# Patient Record
Sex: Female | Born: 1935 | Race: Black or African American | Hispanic: No | Marital: Married | Smoking: Former smoker
Health system: Southern US, Community
[De-identification: ages and names within clinical notes are randomized; demographics above are authoritative.]

## PROBLEM LIST (undated history)

## (undated) DIAGNOSIS — E7800 Pure hypercholesterolemia, unspecified: Secondary | ICD-10-CM

## (undated) DIAGNOSIS — M48062 Spinal stenosis, lumbar region with neurogenic claudication: Secondary | ICD-10-CM

## (undated) DIAGNOSIS — E1122 Type 2 diabetes mellitus with diabetic chronic kidney disease: Secondary | ICD-10-CM

## (undated) DIAGNOSIS — E119 Type 2 diabetes mellitus without complications: Secondary | ICD-10-CM

## (undated) DIAGNOSIS — H409 Unspecified glaucoma: Secondary | ICD-10-CM

## (undated) DIAGNOSIS — I10 Essential (primary) hypertension: Secondary | ICD-10-CM

## (undated) DIAGNOSIS — N186 End stage renal disease: Secondary | ICD-10-CM

## (undated) DIAGNOSIS — F419 Anxiety disorder, unspecified: Secondary | ICD-10-CM

## (undated) DIAGNOSIS — M1990 Unspecified osteoarthritis, unspecified site: Secondary | ICD-10-CM

## (undated) DIAGNOSIS — D75839 Thrombocytosis, unspecified: Secondary | ICD-10-CM

## (undated) DIAGNOSIS — E785 Hyperlipidemia, unspecified: Secondary | ICD-10-CM

## (undated) DIAGNOSIS — I700 Atherosclerosis of aorta: Secondary | ICD-10-CM

## (undated) DIAGNOSIS — G2581 Restless legs syndrome: Secondary | ICD-10-CM

## (undated) DIAGNOSIS — E039 Hypothyroidism, unspecified: Secondary | ICD-10-CM

## (undated) DIAGNOSIS — I25110 Atherosclerotic heart disease of native coronary artery with unstable angina pectoris: Secondary | ICD-10-CM

## (undated) DIAGNOSIS — Z992 Dependence on renal dialysis: Secondary | ICD-10-CM

## (undated) DIAGNOSIS — I2510 Atherosclerotic heart disease of native coronary artery without angina pectoris: Secondary | ICD-10-CM

## (undated) HISTORY — PX: FOOT SURGERY: SHX648

## (undated) HISTORY — DX: Unspecified osteoarthritis, unspecified site: M19.90

## (undated) HISTORY — PX: KNEE ARTHROSCOPY W/ MENISCECTOMY: SHX1879

## (undated) HISTORY — PX: KNEE ARTHROSCOPY: SHX127

## (undated) HISTORY — PX: CORONARY ANGIOPLASTY WITH STENT PLACEMENT: SHX49

## (undated) HISTORY — DX: Hyperlipidemia, unspecified: E78.5

## (undated) HISTORY — PX: BLEPHAROPLASTY: SUR158

## (undated) HISTORY — DX: Essential (primary) hypertension: I10

## (undated) HISTORY — DX: Unspecified glaucoma: H40.9

## (undated) HISTORY — PX: ABDOMINAL HYSTERECTOMY: SHX81

## (undated) HISTORY — PX: CATARACT EXTRACTION W/ INTRAOCULAR LENS  IMPLANT, BILATERAL: SHX1307

## (undated) HISTORY — DX: Type 2 diabetes mellitus without complications: E11.9

## (undated) HISTORY — DX: Atherosclerotic heart disease of native coronary artery without angina pectoris: I25.10

---

## 1997-09-25 DIAGNOSIS — I2510 Atherosclerotic heart disease of native coronary artery without angina pectoris: Secondary | ICD-10-CM

## 1997-09-25 HISTORY — PX: CORONARY ANGIOPLASTY WITH STENT PLACEMENT: SHX49

## 1997-09-25 HISTORY — DX: Atherosclerotic heart disease of native coronary artery without angina pectoris: I25.10

## 2008-09-25 DIAGNOSIS — K631 Perforation of intestine (nontraumatic): Secondary | ICD-10-CM

## 2008-09-25 HISTORY — PX: COLONOSCOPY: SHX174

## 2008-09-25 HISTORY — DX: Perforation of intestine (nontraumatic): K63.1

## 2009-03-25 HISTORY — PX: COLOSTOMY: SHX63

## 2009-08-25 HISTORY — PX: COLOSTOMY REVERSAL: SHX5782

## 2010-09-06 ENCOUNTER — Ambulatory Visit: Payer: Self-pay | Admitting: Family Medicine

## 2012-01-09 ENCOUNTER — Ambulatory Visit: Payer: Self-pay

## 2012-01-24 HISTORY — PX: KNEE ARTHROSCOPY W/ MENISCECTOMY: SHX1879

## 2012-02-06 ENCOUNTER — Ambulatory Visit: Payer: Self-pay | Admitting: Orthopedic Surgery

## 2012-02-06 LAB — CBC WITH DIFFERENTIAL/PLATELET
Basophil %: 0.5 %
Eosinophil %: 3.5 %
HCT: 38.7 % (ref 35.0–47.0)
Lymphocyte %: 23.8 %
MCHC: 32.3 g/dL (ref 32.0–36.0)
MCV: 90 fL (ref 80–100)
Monocyte #: 0.6 x10 3/mm (ref 0.2–0.9)
Neutrophil %: 63 %
RBC: 4.31 10*6/uL (ref 3.80–5.20)
RDW: 16.5 % — ABNORMAL HIGH (ref 11.5–14.5)

## 2012-02-06 LAB — BASIC METABOLIC PANEL
Anion Gap: 8 (ref 7–16)
Calcium, Total: 8.9 mg/dL (ref 8.5–10.1)
Chloride: 107 mmol/L (ref 98–107)
Creatinine: 1.07 mg/dL (ref 0.60–1.30)
Glucose: 81 mg/dL (ref 65–99)
Osmolality: 284 (ref 275–301)
Sodium: 142 mmol/L (ref 136–145)

## 2012-02-13 ENCOUNTER — Ambulatory Visit: Payer: Self-pay | Admitting: Orthopedic Surgery

## 2012-02-13 DIAGNOSIS — I2510 Atherosclerotic heart disease of native coronary artery without angina pectoris: Secondary | ICD-10-CM

## 2012-02-23 ENCOUNTER — Ambulatory Visit: Payer: Self-pay | Admitting: Orthopedic Surgery

## 2012-12-06 ENCOUNTER — Ambulatory Visit: Payer: Self-pay | Admitting: Family Medicine

## 2012-12-07 ENCOUNTER — Emergency Department: Payer: Self-pay | Admitting: Emergency Medicine

## 2012-12-07 LAB — CBC WITH DIFFERENTIAL/PLATELET
Basophil %: 0.7 %
Eosinophil #: 0.2 10*3/uL (ref 0.0–0.7)
Eosinophil %: 1.9 %
HCT: 37.2 % (ref 35.0–47.0)
HGB: 11.9 g/dL — ABNORMAL LOW (ref 12.0–16.0)
Lymphocyte %: 12.7 %
MCH: 28.1 pg (ref 26.0–34.0)
MCHC: 32 g/dL (ref 32.0–36.0)
MCV: 88 fL (ref 80–100)
Monocyte #: 1.1 x10 3/mm — ABNORMAL HIGH (ref 0.2–0.9)
Neutrophil #: 7.1 10*3/uL — ABNORMAL HIGH (ref 1.4–6.5)
Platelet: 216 10*3/uL (ref 150–440)
RBC: 4.23 10*6/uL (ref 3.80–5.20)
WBC: 9.6 10*3/uL (ref 3.6–11.0)

## 2012-12-07 LAB — COMPREHENSIVE METABOLIC PANEL
Albumin: 3.2 g/dL — ABNORMAL LOW (ref 3.4–5.0)
Alkaline Phosphatase: 72 U/L (ref 50–136)
Anion Gap: 4 — ABNORMAL LOW (ref 7–16)
BUN: 18 mg/dL (ref 7–18)
Bilirubin,Total: 0.4 mg/dL (ref 0.2–1.0)
Calcium, Total: 8.9 mg/dL (ref 8.5–10.1)
Chloride: 106 mmol/L (ref 98–107)
Co2: 27 mmol/L (ref 21–32)
EGFR (African American): 52 — ABNORMAL LOW
EGFR (Non-African Amer.): 45 — ABNORMAL LOW
Potassium: 4.2 mmol/L (ref 3.5–5.1)
SGPT (ALT): 33 U/L (ref 12–78)
Sodium: 137 mmol/L (ref 136–145)
Total Protein: 8.6 g/dL — ABNORMAL HIGH (ref 6.4–8.2)

## 2013-08-04 ENCOUNTER — Ambulatory Visit: Payer: Self-pay | Admitting: Family Medicine

## 2013-09-19 ENCOUNTER — Ambulatory Visit: Payer: Self-pay | Admitting: Physical Medicine and Rehabilitation

## 2013-09-23 ENCOUNTER — Emergency Department: Payer: Self-pay | Admitting: Internal Medicine

## 2014-01-07 DIAGNOSIS — I2510 Atherosclerotic heart disease of native coronary artery without angina pectoris: Secondary | ICD-10-CM | POA: Diagnosis present

## 2014-03-09 DIAGNOSIS — G629 Polyneuropathy, unspecified: Secondary | ICD-10-CM | POA: Insufficient documentation

## 2014-05-04 DIAGNOSIS — M48062 Spinal stenosis, lumbar region with neurogenic claudication: Secondary | ICD-10-CM | POA: Insufficient documentation

## 2014-11-13 LAB — TSH: TSH: 1.2 u[IU]/mL (ref <=5.90)

## 2014-11-29 ENCOUNTER — Emergency Department: Payer: Self-pay | Admitting: Emergency Medicine

## 2014-11-29 DIAGNOSIS — A045 Campylobacter enteritis: Secondary | ICD-10-CM

## 2014-11-29 HISTORY — DX: Campylobacter enteritis: A04.5

## 2015-01-04 ENCOUNTER — Telehealth: Payer: Self-pay | Admitting: *Deleted

## 2015-01-04 NOTE — Telephone Encounter (Signed)
pt has a cousin that comes here [facility]). Pt is looking for a new PCP. Is it ok to schedule her with you as a new pt.

## 2015-01-04 NOTE — Telephone Encounter (Signed)
yes

## 2015-01-05 LAB — LIPID PANEL
Cholesterol: 157 mg/dL (ref 0–200)
HDL: 40 mg/dL (ref 35–70)
LDL Cholesterol: 94 mg/dL
Triglycerides: 119 mg/dL (ref 40–160)

## 2015-01-05 LAB — HEMOGLOBIN A1C: HEMOGLOBIN A1C: 6.5 % — AB (ref 4.0–6.0)

## 2015-01-17 NOTE — Op Note (Signed)
PATIENT NAME:  Cassandra [name], Cassandra [name] MR#:  [id number] DATE OF BIRTH:  [birth year]  DATE OF PROCEDURE:  02/13/2012  PREOPERATIVE DIAGNOSES:  1. Left knee osteoarthritis. 2. Medial meniscus tear.   POSTOPERATIVE DIAGNOSES:  1. Left knee osteoarthritis. 2. Medial meniscus tear. 3. Lateral meniscus tear.    PROCEDURES: Left knee arthroscopy, partial medial and lateral meniscectomy.   SURGEON: Laurene Footman, MD  ANESTHESIA: General.    DESCRIPTION OF PROCEDURE: Patient brought to the Operating Room and after adequate anesthesia was obtained the left knee was prepped and draped in the usual sterile fashion with an arthroscopic legholder and tourniquet applied but not required. After prepping and draping and timeout procedures were completed, an inferolateral portal was made and the arthroscope was introduced. Initial inspection revealed some mild patellofemoral degenerative change. No exposed bone and no fissuring to bone with normal tracking. Coming around medially, an inferomedial portal was made and on probing there was extensive complex tear of the middle and posterior thirds of the meniscus involving most of the body. There were horizontal and vertical components. The anterior cruciate ligament was intact. The articular cartilage showed some grade I and II changes but again no fissuring, no exposed bone and no severe degenerative changes. Going laterally there was a radial tear at the posterior horn of the lateral meniscus involving approximately half the depth of the meniscus and extending towards the medial aspect. The articular cartilage was near normal. A meniscal punch was used initially followed by an ArthroCare wand to debride this meniscal tear. Going medially a meniscal punch was used followed by a shaver to get the large meniscus tear removed with resection of most of the middle and posterior thirds. A wand was then used to smooth the edges and minimize bleeding. The gutters were checked and  there were no loose bodies. After thorough irrigation of the knee, all instrumentation was withdrawn. The wounds were closed with 4-0 nylon in simple interrupted fashion. 20 mL of 0.5% Sensorcaine with epinephrine infiltrated into the incisions. Xeroform, 4 x 4's, Webril, Ace wrap were applied. Patient sent to recovery room in stable condition.    ESTIMATED BLOOD LOSS: Minimal.   COMPLICATIONS: None.   SPECIMEN: None.    ____________________________ Laurene Footman, MD mjm:cms D: 02/13/2012 21:58:30 ET T: 02/14/2012 09:53:04 ET JOB#: [id number]  cc: Laurene Footman, MD, <Dictator> Laurene Footman MD ELECTRONICALLY SIGNED 02/14/2012 17:49

## 2015-01-21 ENCOUNTER — Ambulatory Visit (INDEPENDENT_AMBULATORY_CARE_PROVIDER_SITE_OTHER): Payer: Medicare Other | Admitting: Internal Medicine

## 2015-01-21 ENCOUNTER — Encounter (INDEPENDENT_AMBULATORY_CARE_PROVIDER_SITE_OTHER): Payer: Self-pay

## 2015-01-21 ENCOUNTER — Encounter: Payer: Self-pay | Admitting: Internal Medicine

## 2015-01-21 VITALS — BP 126/64 | HR 55 | Temp 97.7°F | Resp 14 | Ht 61.0 in | Wt 173.8 lb

## 2015-01-21 DIAGNOSIS — G609 Hereditary and idiopathic neuropathy, unspecified: Secondary | ICD-10-CM | POA: Diagnosis not present

## 2015-01-21 DIAGNOSIS — R609 Edema, unspecified: Secondary | ICD-10-CM | POA: Diagnosis not present

## 2015-01-21 DIAGNOSIS — I10 Essential (primary) hypertension: Secondary | ICD-10-CM

## 2015-01-21 DIAGNOSIS — K5909 Other constipation: Secondary | ICD-10-CM

## 2015-01-21 DIAGNOSIS — G2581 Restless legs syndrome: Secondary | ICD-10-CM | POA: Diagnosis not present

## 2015-01-21 DIAGNOSIS — K5900 Constipation, unspecified: Secondary | ICD-10-CM

## 2015-01-21 DIAGNOSIS — E1142 Type 2 diabetes mellitus with diabetic polyneuropathy: Secondary | ICD-10-CM

## 2015-01-21 LAB — FERRITIN: FERRITIN: 217.2 ng/mL (ref 10.0–291.0)

## 2015-01-21 LAB — VITAMIN B12: VITAMIN B 12: 321 pg/mL (ref 211–911)

## 2015-01-21 MED ORDER — BENAZEPRIL HCL 40 MG PO TABS: 40.0000 mg | ORAL_TABLET | Freq: Every day | ORAL | Status: DC

## 2015-01-21 NOTE — Progress Notes (Signed)
Pre-visit discussion using our clinic review tool. No additional management support is needed unless otherwise documented below in the visit note.  

## 2015-01-21 NOTE — Patient Instructions (Signed)
Your fluid retention  May be caused by  several of your medications combining  to cause the water retention   I am stopping the AMLODIPINE necause it can do this,  I have increaesd the benazeprine to 40 mg in its place.  The gabapentin can also cause fluid retention,  But we have very few alternatives,    I am checking iron stores today

## 2015-01-21 NOTE — Progress Notes (Signed)
Patient ID: Cassandra [name], female   DOB: 1927-08-05, 81 y.o.   MRN: [id number]   Patient Active Problem List   Diagnosis Date Noted  . Edema 01/24/2015  . Essential hypertension 01/24/2015  . 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 Restless legs syndrome 01/24/2015  . Chronic constipation 01/24/2015    Subjective:  CC:   Chief Complaint  Patient presents with  . Establish Care    HPI:   Cassandra [name] a 81 y.o. female who presents as a new patient in transfer from [place] clinic with several chief complaints.   Cc:  Swollen ankles for the past 5 months  , on and off,  previous doctor did not adequately address.  On 3 meds, that can cause edema and she is using aleve or meloxicam for leg spasms, but also seeing Neurology Dr. Brigitte Pulse,  And taking ropinirole  0.5 in the evening.  Has muscle jerks  At 2 pm and Dr Brigitte Pulse recommended increasing the dose to 2 daily,  But she has not done this .  Feels tingling in the legs first,  Then the jerking starts,    HISTORY OF FOOT SURGERY IN 2000, LEFT KNEE ARTHRSCOPY 2014.  2) Chronic constipation  History of perforated colon.  Takes stool softener every night per MD , 3) elevated blood sugars 5 months ago,  daignosed with DM and prescribed glimepiride 1 mg wth breakfast but wants to take it at dinner.    4) Tinnitus,  Wears a heaing  Aid, since last year,  Doesn't think she needs it.  Uses it only when tinnitus acts up. Tinnitus is worse in the evening when the house is quiet.    5) Hypertension  6) hyperlipidimia  7) Neuropathy. Unclear if workup was done, or for restless legs.   8) History of colonic peforation s/p colectomy with reversal   9)HAS NOT HAD A MAMMOGRAM IN 2 YEARS.  DISCUSSED RESUMING THEM  GETS ANNUAL STRESS TEST      Past Medical History  Diagnosis Date  . Arthritis   . Diabetes mellitus without complication   . Glaucoma   . Hyperlipidemia   . Hypertension         @ALL @  Past Surgical History  Procedure Laterality Date  . Coronary angioplasty with stent placement    . Abdominal hysterectomy      History   Social History  . Marital Status: Married    Spouse Name: N/A  . Number of Children: N/A  . Years of Education: N/A   Occupational History  . Not on file.   Social History Main Topics  . Smoking status: Former Smoker    Quit date: 01/21/1980  . Smokeless tobacco: Never Used  . Alcohol Use: 0.0 oz/week    0 Standard drinks or equivalent per week     Comment: occasionally  . Drug Use: No  . Sexual Activity: No   Other Topics Concern  . Not on file   Social History Narrative    @FAMH @       Review of Systems:   The rest of the review of systems was negative except those addressed in the HPI.      Objective:  BP 126/64 mmHg  Pulse 55  Temp(Src) 97.7 F (36.5 C) (Oral)  Resp 14  Ht 5\' 1"  (1.549 m)  Wt 173 lb 12 oz (78.812 kg)  BMI 32.85 kg/m2  SpO2 94%  General appearance: alert, cooperative and  appears stated age Ears: normal TM's and external ear canals both ears Throat: lips, mucosa, and tongue normal; teeth and gums normal Neck: no adenopathy, no carotid bruit, supple, symmetrical, trachea midline and thyroid not enlarged, symmetric, no tenderness/mass/nodules Back: symmetric, no curvature. ROM normal. No CVA tenderness. Lungs: clear to auscultation bilaterally Heart: regular rate and rhythm, S1, S2 normal, no murmur, click, rub or gallop Abdomen: soft, non-tender; bowel sounds normal; no masses,  no organomegaly Pulses: 2+ and symmetric Skin: Skin color, texture, turgor normal. No rashes or lesions Lymph nodes: Cervical, supraclavicular, and axillary nodes normal.  Assessment and Plan:  Essential hypertension Well controlled,  But stopping the amlodipine  To improve her edema.  Increasing benazepril to 40 mg daily .  Cr was checked earlier in the month by [facility] clinic  Lab Results   Component Value Date   CREATININE 1.13 12/07/2012   Lab Results  Component Value Date   NA 137 12/07/2012   K 4.2 12/07/2012   CL 106 12/07/2012   CO2 27 12/07/2012      Chronic constipation Not clear if patient has diverticulosis,  Agree with daily use of stool softener and bullk forming laxatives    Type 2 DM with diabetic neuropathy affecting both sides of body Managed with 1 mg  Daily glimepiride.  Workup for reversible causes of neuropathy advised.    Lab Results  Component Value Date   CREATININE 1.13 12/07/2012   No results found for: Derl Barrow Lab Results  Component Value Date   [id number] 01/21/2015   Lab Results  Component Value Date   HGBA1C 6.5* 01/05/2015      Edema Will adjust medications as several may be causative followed by rule out of venous insufficiency    Restless legs syndrome Iron stores were checked today and  normal.  Continue ropinirole  Lab Results  Component Value Date   IRON 61 01/21/2015   TIBC 348 01/21/2015   FERRITIN 217.2 01/21/2015       Updated Medication List Outpatient Encounter Prescriptions as of 01/21/2015  . Order #: [id number] Class: Historical Med  . Order #: [id number] Class: Historical Med  . Order #: [id number] Class: Historical Med  . Order #: [id number] Class: Historical Med  . Order #: [id number] Class: Historical Med  . Order #: [id number] Class: Historical Med  . Order #: [id number] Class: Historical Med  . Order #: [id number] Class: Historical Med  . Order #: [id number] Class: Historical Med  . Order #: [id number] Class: Historical Med  . Order #: [id number] Class: Historical Med  . Order #: [id number] Class: Historical Med  . [DISCONTINUED] Order #: [id number] Class: Historical Med  . Order #: [id number] Class: Normal  . [DISCONTINUED] Order #: [id number] Class: Historical Med     Orders Placed This Encounter  Procedures  . Iron and TIBC  . Vitamin B12  . Folate RBC  . Ferritin  .  Hemoglobin A1c  . Lipid panel  . TSH    Return in about 4 weeks (around 02/18/2015) for follow up diabetes.

## 2015-01-22 LAB — IRON AND TIBC
%SAT: 18 % — ABNORMAL LOW (ref 20–55)
Iron: 61 ug/dL (ref 42–145)
TIBC: 348 ug/dL (ref 250–470)
UIBC: 287 ug/dL (ref 125–400)

## 2015-01-22 LAB — FOLATE RBC: RBC Folate: 530 ng/mL (ref >280)

## 2015-01-24 ENCOUNTER — Encounter: Payer: Self-pay | Admitting: Internal Medicine

## 2015-01-24 DIAGNOSIS — K5909 Other constipation: Secondary | ICD-10-CM | POA: Insufficient documentation

## 2015-01-24 DIAGNOSIS — I10 Essential (primary) hypertension: Secondary | ICD-10-CM | POA: Insufficient documentation

## 2015-01-24 DIAGNOSIS — E1142 Type 2 diabetes mellitus with diabetic polyneuropathy: Secondary | ICD-10-CM | POA: Insufficient documentation

## 2015-01-24 DIAGNOSIS — E119 Type 2 diabetes mellitus without complications: Secondary | ICD-10-CM | POA: Insufficient documentation

## 2015-01-24 DIAGNOSIS — G2581 Restless legs syndrome: Secondary | ICD-10-CM | POA: Insufficient documentation

## 2015-01-24 NOTE — Assessment & Plan Note (Addendum)
Well controlled,  But stopping the amlodipine  To improve her edema.  Increasing benazepril to 40 mg daily .  Cr was checked earlier in the month by [facility]  Lab Results  Component Value Date   CREATININE 1.13 12/07/2012   Lab Results  Component Value Date   NA 137 12/07/2012   K 4.2 12/07/2012   CL 106 12/07/2012   CO2 27 12/07/2012

## 2015-01-24 NOTE — Assessment & Plan Note (Signed)
Not clear if patient has diverticulosis,  Agree with daily use of stool softener and bullk forming laxatives

## 2015-01-24 NOTE — Telephone Encounter (Signed)
Iron, B12 and folate studies are normal.  EPIC will not route results notes currently

## 2015-01-24 NOTE — Assessment & Plan Note (Signed)
Will adjust medications as several may be causative followed by rule out of venous insufficiency

## 2015-01-24 NOTE — Assessment & Plan Note (Addendum)
Managed with 1 mg  Daily glimepiride.  Workup for reversible causes of neuropathy advised.    Lab Results  Component Value Date   CREATININE 1.13 12/07/2012   No results found for: Derl Barrow Lab Results  Component Value Date   [id number] 01/21/2015   Lab Results  Component Value Date   HGBA1C 6.5* 01/05/2015

## 2015-01-24 NOTE — Assessment & Plan Note (Signed)
Iron stores were checked today and  normal.  Continue ropinirole  Lab Results  Component Value Date   IRON 61 01/21/2015   TIBC 348 01/21/2015   FERRITIN 217.2 01/21/2015

## 2015-01-25 NOTE — Telephone Encounter (Signed)
Patient notified and voiced understanding.

## 2015-02-17 ENCOUNTER — Other Ambulatory Visit: Payer: Self-pay | Admitting: Family Medicine

## 2015-02-17 DIAGNOSIS — R9389 Abnormal findings on diagnostic imaging of other specified body structures: Secondary | ICD-10-CM

## 2015-02-18 ENCOUNTER — Ambulatory Visit: Payer: Medicare Other | Admitting: Internal Medicine

## 2015-02-25 ENCOUNTER — Ambulatory Visit
Admission: RE | Admit: 2015-02-25 | Discharge: 2015-02-25 | Disposition: A | Discharge status: Home / Self Care | Payer: Medicare Other | Source: Ambulatory Visit | Attending: Family Medicine | Admitting: Family Medicine

## 2015-02-25 DIAGNOSIS — I2510 Atherosclerotic heart disease of native coronary artery without angina pectoris: Secondary | ICD-10-CM | POA: Insufficient documentation

## 2015-02-25 DIAGNOSIS — R938 Abnormal findings on diagnostic imaging of other specified body structures: Secondary | ICD-10-CM | POA: Diagnosis present

## 2015-02-25 DIAGNOSIS — R918 Other nonspecific abnormal finding of lung field: Secondary | ICD-10-CM | POA: Insufficient documentation

## 2015-02-25 DIAGNOSIS — R9389 Abnormal findings on diagnostic imaging of other specified body structures: Secondary | ICD-10-CM

## 2015-02-25 DIAGNOSIS — R911 Solitary pulmonary nodule: Secondary | ICD-10-CM | POA: Insufficient documentation

## 2015-09-02 ENCOUNTER — Other Ambulatory Visit: Payer: Self-pay | Admitting: Specialist

## 2015-09-02 DIAGNOSIS — R918 Other nonspecific abnormal finding of lung field: Secondary | ICD-10-CM

## 2015-09-14 ENCOUNTER — Ambulatory Visit
Admission: RE | Admit: 2015-09-14 | Discharge: 2015-09-14 | Disposition: A | Discharge status: Home / Self Care | Payer: Medicare Other | Source: Ambulatory Visit | Attending: Specialist | Admitting: Specialist

## 2015-09-14 DIAGNOSIS — R918 Other nonspecific abnormal finding of lung field: Secondary | ICD-10-CM

## 2015-09-14 DIAGNOSIS — I2510 Atherosclerotic heart disease of native coronary artery without angina pectoris: Secondary | ICD-10-CM | POA: Diagnosis not present

## 2015-09-14 DIAGNOSIS — J439 Emphysema, unspecified: Secondary | ICD-10-CM | POA: Diagnosis not present

## 2015-09-14 DIAGNOSIS — I700 Atherosclerosis of aorta: Secondary | ICD-10-CM | POA: Insufficient documentation

## 2015-09-17 ENCOUNTER — Other Ambulatory Visit: Payer: Self-pay | Admitting: Specialist

## 2015-09-17 DIAGNOSIS — R911 Solitary pulmonary nodule: Secondary | ICD-10-CM

## 2015-09-24 ENCOUNTER — Encounter
Admission: RE | Admit: 2015-09-24 | Discharge: 2015-09-24 | Disposition: A | Discharge status: Home / Self Care | Payer: Medicare Other | Source: Ambulatory Visit | Attending: Specialist | Admitting: Specialist

## 2015-09-24 DIAGNOSIS — R911 Solitary pulmonary nodule: Secondary | ICD-10-CM

## 2015-09-24 LAB — GLUCOSE, CAPILLARY: GLUCOSE-CAPILLARY: 90 mg/dL (ref 65–99)

## 2015-09-24 MED ORDER — FLUDEOXYGLUCOSE F - 18 (FDG) INJECTION
12.0700 | Freq: Once | INTRAVENOUS | Status: AC | PRN
  Administered 2015-09-24: 12.07 via INTRAVENOUS

## 2015-10-04 ENCOUNTER — Inpatient Hospital Stay: Payer: Medicare Other

## 2015-10-08 ENCOUNTER — Encounter: Payer: Self-pay | Admitting: Internal Medicine

## 2015-10-08 ENCOUNTER — Inpatient Hospital Stay: Payer: Medicare Other | Attending: Internal Medicine | Admitting: Internal Medicine

## 2015-10-08 DIAGNOSIS — I2510 Atherosclerotic heart disease of native coronary artery without angina pectoris: Secondary | ICD-10-CM | POA: Insufficient documentation

## 2015-10-08 DIAGNOSIS — Z87891 Personal history of nicotine dependence: Secondary | ICD-10-CM | POA: Diagnosis not present

## 2015-10-08 DIAGNOSIS — I10 Essential (primary) hypertension: Secondary | ICD-10-CM | POA: Insufficient documentation

## 2015-10-08 DIAGNOSIS — E785 Hyperlipidemia, unspecified: Secondary | ICD-10-CM | POA: Insufficient documentation

## 2015-10-08 DIAGNOSIS — Z79899 Other long term (current) drug therapy: Secondary | ICD-10-CM | POA: Diagnosis not present

## 2015-10-08 DIAGNOSIS — R911 Solitary pulmonary nodule: Secondary | ICD-10-CM | POA: Diagnosis present

## 2015-10-08 DIAGNOSIS — Z7982 Long term (current) use of aspirin: Secondary | ICD-10-CM | POA: Diagnosis not present

## 2015-10-08 DIAGNOSIS — E119 Type 2 diabetes mellitus without complications: Secondary | ICD-10-CM | POA: Diagnosis not present

## 2015-10-08 NOTE — Progress Notes (Signed)
Pt states that she use to live in [place] and she had scans every 6 months then every year and the pulmonary nodule never changed and it was tiny so the MD in [place] stopped scanning her.  She moved to [facility] and has lots of sinusitis and then it ends up in her chest and she gets bronchitis and her PCP did cxray and repeated it because it showed nodule and then she had ct scan and then her PCP sent her to pulmonary and he did PFT test and said it was great and then sch. Her pet scan.  The nodule looked like it had increased in size and was sent here for eval.  Does not have breathing issues at this time. No sinus drainage and she does take nasal spray every day as well as allegy medicine to help with seasonal allergies

## 2015-10-08 NOTE — Progress Notes (Signed)
[place] @ [facility] [phone]  [phone]     Cassandra [name] OB: 11/12/1926  MR#: [id number]  [id number]  Patient Care Team: Dion Body, MD as PCP - General (Family Medicine)  CHIEF COMPLAINT:  Chief Complaint  Patient presents with  . abnormal pet scan     No history exists.    No flowsheet data found.  HISTORY OF PRESENT ILLNESS:   Cassandra [name] is a very active 82 year old lady, who has a history of recurrent bronchitis, and a history of a left upper lobe nodule, detected on one chest x-ray at least 5 years ago. She had several follow-up imaging studies, while in [place], and reportedly, there was no progression of the nodule. After moving down to [place] 2 years ago, she was seen again for about of bronchitis, and chest x-ray again showed the presence of this nodule, and follow-up CT scans showed slow increase in size, which led to PET/CT scan, revealing mildly increased SUV of 1.7. The patient is referred to our clinic for additional evaluation. Currently, Cassandra [name] feels well. She has no cough, chest pain, shortness of breath, bone pain, hemoptysis, weight loss. She is active around the house and plans to join the [facility] for additional exercise.  REVIEW OF SYSTEMS:   Review of Systems  All other systems reviewed and are negative.    PAST MEDICAL HISTORY: Past Medical History  Diagnosis Date  . Arthritis   . Diabetes mellitus without complication ([place])   . Glaucoma   . Hyperlipidemia   . Hypertension     PAST SURGICAL HISTORY: Past Surgical History  Procedure Laterality Date  . Coronary angioplasty with stent placement    . Abdominal hysterectomy      FAMILY HISTORY Family History  Problem Relation Age of Onset  . Cancer Mother   . Hypertension Mother   . Hypertension Father   . Early death Father   . Heart disease Father     ADVANCED DIRECTIVES:  No flowsheet data found.  HEALTH MAINTENANCE: Social  History  Substance Use Topics  . Smoking status: Former Smoker    Quit date: 01/21/1980  . Smokeless tobacco: Never Used  . Alcohol Use: 0.0 oz/week    0 Standard drinks or equivalent per week     Comment: occasionally     Allergies  Allergen Reactions  . Atorvastatin Other (See Comments)    MYALGIA  . Cyclobenzaprine Other (See Comments)  . Hydrocodone-Acetaminophen     Other reaction(s): Hallucination  . Oxycodone-Acetaminophen Other (See Comments)  . Paroxetine Hcl Other (See Comments)  . Penicillins Swelling    Lip and orbital swelling  . Propoxyphene Other (See Comments)  . Trazodone Other (See Comments)    Current Outpatient Prescriptions  Medication Sig Dispense Refill  . aspirin EC 81 MG tablet Take 1 tablet by mouth daily.    Marland Kitchen azelastine (ASTELIN) 0.1 % nasal spray Place 2 sprays into the nose.    . cetirizine (ZYRTEC) 10 MG tablet Take 1 tablet by mouth daily.    Marland Kitchen glimepiride (AMARYL) 2 MG tablet Take 0.5 tablets by mouth daily. Take 0.5 tablet daily with largest meal    . levothyroxine (SYNTHROID, LEVOTHROID) 75 MCG tablet Take 1 tablet by mouth daily. Take on an empty stomach 30 to 60 minutes before breakfast.    . metoprolol succinate (TOPROL-XL) 50 MG 24 hr tablet Take 1 tablet by mouth daily.    . rosuvastatin (CRESTOR) 40 MG tablet Take 1 tablet  by mouth daily.    . timolol (BETIMOL) 0.5 % ophthalmic solution Apply 1 drop to eye daily. Place one 1 drop into both eyes every morning.    . triamcinolone cream (KENALOG) 0.1 % APPLY TO AFFECTED AREA TWICE A DAY AS NEEDED    . benazepril (LOTENSIN) 40 MG tablet Take 1 tablet (40 mg total) by mouth daily. 90 tablet 3  . gabapentin (NEURONTIN) 300 MG capsule Take 1 capsule by mouth 3 (three) times daily.     Marland Kitchen rOPINIRole (REQUIP) 0.5 MG tablet Take 1 tablet by mouth 2 (two) times daily.     No current facility-administered medications for this visit.    OBJECTIVE:  There were no vitals filed for this visit.    There is no weight on file to calculate BMI.    ECOG FS:0 - Asymptomatic  Physical Exam  Constitutional: She is oriented to person, place, and time and well-developed, well-nourished, and in no distress. No distress.  Younger than stated age, obese African-American female  HENT:  Head: Normocephalic and atraumatic.  Right Ear: External ear normal.  Left Ear: External ear normal.  Mouth/Throat: Oropharynx is clear and moist.  Eyes: Conjunctivae are normal. Pupils are equal, round, and reactive to light. Right eye exhibits no discharge. Left eye exhibits no discharge. No scleral icterus.  Neck: Normal range of motion. Neck supple. No JVD present. No tracheal deviation present. No thyromegaly present.  Cardiovascular: Normal rate, regular rhythm, normal heart sounds and intact distal pulses.  Exam reveals no gallop and no friction rub.   No murmur heard. Pulmonary/Chest: Effort normal and breath sounds normal. No stridor. No respiratory distress. She has no wheezes. She has no rales. She exhibits no tenderness.  Abdominal: Soft. Bowel sounds are normal. She exhibits no distension and no mass. There is no tenderness. There is no rebound and no guarding.  Genitourinary:  Postponed  Musculoskeletal: Normal range of motion. She exhibits no edema or tenderness.  Lymphadenopathy:    She has no cervical adenopathy.  Neurological: She is alert and oriented to person, place, and time. She has normal reflexes. No cranial nerve deficit. She exhibits normal muscle tone. Gait normal. Coordination normal. GCS score is 15.  Skin: Skin is warm. No rash noted. She is not diaphoretic. No erythema. No pallor.  Psychiatric: Mood, memory, affect and judgment normal.  Nursing note and vitals reviewed.    LAB RESULTS:  CBC Latest Ref Rng 12/07/2012 02/06/2012  WBC 3.6-11.0 x10 3/mm 3 9.6 6.4  Hemoglobin 12.0-16.0 g/dL 11.9(L) 12.5  Hematocrit 35.0-47.0 % 37.2 38.7  Platelets 150-440 x10 3/mm 3 216 165    No  visits with results within 5 Day(s) from this visit. Latest known visit with results is:  Hospital Outpatient Visit on 09/24/2015  Component Date Value Ref Range Status  . Glucose-Capillary 09/24/2015 90  65 - 99 mg/dL Final    STUDIES: Ct Chest Wo Contrast  09/14/2015  CLINICAL DATA:  Bronchitis.  Follow-up pulmonary nodules. EXAM: CT CHEST WITHOUT CONTRAST TECHNIQUE: Multidetector CT imaging of the chest was performed following the standard protocol without IV contrast. COMPARISON:  02/25/2015 FINDINGS: Mediastinum: Heart size is mildly enlarged. There is aortic atherosclerosis. Calcification within the RCA, LAD and left circumflex coronary artery noted. Lungs/Pleura: No pleural fluid. Pulmonary followup identified overlying the left upper lobe anteriorly. This is similar to previous exam. Within the left upper lobe there is a nodule measuring 1.9 x 0.7 cm, image 21 of series 3. On 02/25/2015 this  measured 1.9 x 0.9 cm. On 08/04/2013 this nodule measured 1.2 x 0.5 cm. Nodule in the posterior right lower lobe is stable measuring 5 mm and is compatible with a benign process. Stable subpleural nodule within the anterior right upper lobe measuring 4 mm. Upper Abdomen: The adrenal glands are unremarkable. The visualized portions of the liver and spleen appear normal. The visualized portions of the pancreas appear normal. Musculoskeletal: No aggressive lytic or sclerotic bone lesions. Mild spondylosis identified throughout the thoracic spine. IMPRESSION: 1. Pulmonary nodule within the left upper lobe is again noted. This is not significantly changed in size from 02/25/2015 but has increased from 08/04/2013. Slow-growing pulmonary neoplasm cannot be excluded. Suggest further assessment with PET-CT. 2. Emphysema 3. Aortic atherosclerosis and multi vessel coronary artery calcification. Electronically Signed   By: Kerby Moors M.D.   On: 09/14/2015 14:46   Nm Pet Image Initial (pi) Skull Base To  Thigh  09/24/2015  CLINICAL DATA:  Initial treatment strategy for left upper lobe pulmonary nodule. EXAM: NUCLEAR MEDICINE PET SKULL BASE TO THIGH TECHNIQUE: 12.1 mCi F-18 FDG was injected intravenously. Full-ring PET imaging was performed from the skull base to thigh after the radiotracer. CT data was obtained and used for attenuation correction and anatomic localization. FASTING BLOOD GLUCOSE:  Value: 90 mg/dl COMPARISON:  Chest CT on 02/25/2015 and 08/04/2013 FINDINGS: NECK No hypermetabolic lymph nodes in the neck. CHEST No hypermetabolic mediastinal or hilar nodes. Incidental note is made of a small right-sided distal esophageal diverticulum. 8 x 18 mm pulmonary nodule in the posterior left upper lobe which shows mild enlargement compared with previous studies shows low-grade metabolic activity, with SUV max of 1.7. This is suspicious for bronchogenic carcinoma. Other tiny scattered sub-cm pulmonary nodules appear stable and show no associated metabolic activity. ABDOMEN/PELVIS No abnormal hypermetabolic activity within the liver, pancreas, adrenal glands, or spleen. No hypermetabolic lymph nodes in the abdomen or pelvis. Tiny calcified gallstone noted. Colonic diverticulosis is demonstrated, without evidence of diverticulitis. Prior hysterectomy noted. SKELETON No focal hypermetabolic activity to suggest skeletal metastasis. IMPRESSION: Slowly enlarging 18 mm posterior left upper lobe nodule with low-grade metabolic activity (SUV max 1.7). This is suspicious for a low-grade bronchogenic carcinoma. No evidence of thoracic nodal or distant metastatic disease. Electronically Signed   By: Earle Gell M.D.   On: 09/24/2015 12:12    ASSESSMENT and MEDICAL DECISION MAKING:    PET Positive left upper lobe nodule-reportedly, patient has had this nodule on the CT scans for more than 5 years. Last year on a follow-up scan the nodule was noted to be larger, and follow-up PET scan showed mildly elevated SUV of  1.7. Certainly, there is a potential suspicion for a lung cancer, however, since inflammatory changes are also a consideration here, it is reasonable to repeat PET CT scan in 3 months, and if there is further increase in size of the nodule and intensity of glucose uptake, consider either transthoracic biopsy or EBUS- guided biopsy. The reason why I want to pursue any workup, is the fact that despite her respectable Cassandra [name] is very active and has an excellent performance status. We will discuss our plan of action at the case conference next week, and will contact Cassandra [name] without plan and recommendation. Patient expressed understanding and was in agreement with this plan. She also understands that She can call clinic at any time with any questions, concerns, or complaints.    No matching staging information was found for the patient.  Cassandra [name]  Rudean Hitt, MD   10/08/2015 2:50 PM

## 2015-10-18 ENCOUNTER — Telehealth: Payer: Self-pay | Admitting: *Deleted

## 2015-10-18 ENCOUNTER — Other Ambulatory Visit: Payer: Self-pay | Admitting: *Deleted

## 2015-10-18 DIAGNOSIS — R942 Abnormal results of pulmonary function studies: Secondary | ICD-10-CM

## 2015-10-18 DIAGNOSIS — R911 Solitary pulmonary nodule: Secondary | ICD-10-CM

## 2015-10-18 NOTE — Telephone Encounter (Signed)
Called pt to ler her know about the bx of her lung.  i confirmed that she had spoke ot Dr. Rudean Hitt earlier in the day about what case conference doctors cam to decision of.  She confirmed that he spoke to her and wanted to have bx.  She is very anxious over the phone and I did give her the date of bx 2/3 arrive at 9 am, have blood work to check to make sure labs are good on 1/3 10 am.  See dr Rudean Hitt on 11/03/15 at 11 am.  Patient is very anxious and wants someone to call her and tell her how long she is going to be there for the procedure and walk her through the steps and what problems she may encounter and I told her I would ask special nurse either allison or juanita and i did call and get voicemail and left message to see if they would call her.

## 2015-10-26 ENCOUNTER — Inpatient Hospital Stay: Payer: Medicare Other

## 2015-10-26 ENCOUNTER — Other Ambulatory Visit: Payer: Self-pay | Admitting: *Deleted

## 2015-10-26 DIAGNOSIS — R911 Solitary pulmonary nodule: Secondary | ICD-10-CM | POA: Diagnosis not present

## 2015-10-26 DIAGNOSIS — Z01812 Encounter for preprocedural laboratory examination: Secondary | ICD-10-CM

## 2015-10-26 DIAGNOSIS — R918 Other nonspecific abnormal finding of lung field: Secondary | ICD-10-CM

## 2015-10-26 LAB — CBC WITH DIFFERENTIAL/PLATELET
BASOS ABS: 0.1 10*3/uL (ref 0–0.1)
BASOS PCT: 1 %
Eosinophils Absolute: 0.2 10*3/uL (ref 0–0.7)
Eosinophils Relative: 3 %
HEMATOCRIT: 40.5 % (ref 35.0–47.0)
HEMOGLOBIN: 13.4 g/dL (ref 12.0–16.0)
LYMPHS PCT: 23 %
Lymphs Abs: 2.1 10*3/uL (ref 1.0–3.6)
MCH: 28.4 pg (ref 26.0–34.0)
MCHC: 33 g/dL (ref 32.0–36.0)
MCV: 86 fL (ref 80.0–100.0)
MONOS PCT: 9 %
Monocytes Absolute: 0.8 10*3/uL (ref 0.2–0.9)
NEUTROS ABS: 5.9 10*3/uL (ref 1.4–6.5)
NEUTROS PCT: 64 %
Platelets: 183 10*3/uL (ref 150–440)
RBC: 4.71 MIL/uL (ref 3.80–5.20)
RDW: 16.7 % — ABNORMAL HIGH (ref 11.5–14.5)
WBC: 9.2 10*3/uL (ref 3.6–11.0)

## 2015-10-26 LAB — APTT: APTT: 32 s (ref 24–36)

## 2015-10-26 LAB — PROTIME-INR
INR: 0.99
Prothrombin Time: 13.3 seconds (ref 11.4–15.0)

## 2015-10-28 ENCOUNTER — Other Ambulatory Visit: Payer: Self-pay | Admitting: Physician Assistant

## 2015-10-29 ENCOUNTER — Ambulatory Visit
Admission: RE | Admit: 2015-10-29 | Discharge: 2015-10-29 | Disposition: A | Discharge status: Home / Self Care | Payer: Medicare Other | Source: Ambulatory Visit | Attending: Diagnostic Radiology | Admitting: Diagnostic Radiology

## 2015-10-29 ENCOUNTER — Ambulatory Visit
Admission: RE | Admit: 2015-10-29 | Discharge: 2015-10-29 | Disposition: A | Discharge status: Home / Self Care | Payer: Medicare Other | Source: Ambulatory Visit | Attending: Internal Medicine | Admitting: Internal Medicine

## 2015-10-29 DIAGNOSIS — J850 Gangrene and necrosis of lung: Secondary | ICD-10-CM | POA: Diagnosis not present

## 2015-10-29 DIAGNOSIS — Z0189 Encounter for other specified special examinations: Secondary | ICD-10-CM | POA: Insufficient documentation

## 2015-10-29 DIAGNOSIS — M1990 Unspecified osteoarthritis, unspecified site: Secondary | ICD-10-CM | POA: Diagnosis not present

## 2015-10-29 DIAGNOSIS — I2510 Atherosclerotic heart disease of native coronary artery without angina pectoris: Secondary | ICD-10-CM | POA: Diagnosis not present

## 2015-10-29 DIAGNOSIS — E119 Type 2 diabetes mellitus without complications: Secondary | ICD-10-CM | POA: Insufficient documentation

## 2015-10-29 DIAGNOSIS — Z7982 Long term (current) use of aspirin: Secondary | ICD-10-CM | POA: Insufficient documentation

## 2015-10-29 DIAGNOSIS — Z880 Allergy status to penicillin: Secondary | ICD-10-CM | POA: Insufficient documentation

## 2015-10-29 DIAGNOSIS — Z809 Family history of malignant neoplasm, unspecified: Secondary | ICD-10-CM | POA: Diagnosis not present

## 2015-10-29 DIAGNOSIS — R942 Abnormal results of pulmonary function studies: Secondary | ICD-10-CM | POA: Diagnosis present

## 2015-10-29 DIAGNOSIS — Z87891 Personal history of nicotine dependence: Secondary | ICD-10-CM | POA: Diagnosis not present

## 2015-10-29 DIAGNOSIS — E785 Hyperlipidemia, unspecified: Secondary | ICD-10-CM | POA: Diagnosis not present

## 2015-10-29 DIAGNOSIS — H409 Unspecified glaucoma: Secondary | ICD-10-CM | POA: Diagnosis not present

## 2015-10-29 DIAGNOSIS — Z9889 Other specified postprocedural states: Secondary | ICD-10-CM

## 2015-10-29 DIAGNOSIS — I10 Essential (primary) hypertension: Secondary | ICD-10-CM | POA: Diagnosis not present

## 2015-10-29 DIAGNOSIS — R911 Solitary pulmonary nodule: Secondary | ICD-10-CM

## 2015-10-29 HISTORY — DX: Hypothyroidism, unspecified: E03.9

## 2015-10-29 LAB — GLUCOSE, CAPILLARY: GLUCOSE-CAPILLARY: 96 mg/dL (ref 65–99)

## 2015-10-29 MED ORDER — FENTANYL CITRATE (PF) 100 MCG/2ML IJ SOLN
INTRAMUSCULAR | Status: AC
  Filled 2015-10-29: qty 4

## 2015-10-29 MED ORDER — MIDAZOLAM HCL 5 MG/5ML IJ SOLN
INTRAMUSCULAR | Status: AC
  Filled 2015-10-29: qty 5

## 2015-10-29 MED ORDER — MIDAZOLAM HCL 5 MG/5ML IJ SOLN
INTRAMUSCULAR | Status: AC | PRN
  Administered 2015-10-29: 1 mg via INTRAVENOUS
  Administered 2015-10-29 (×2): 0.5 mg via INTRAVENOUS
  Administered 2015-10-29: 1 mg via INTRAVENOUS

## 2015-10-29 MED ORDER — SODIUM CHLORIDE 0.9 % IV SOLN
INTRAVENOUS | Status: DC
  Administered 2015-10-29: 10:00:00 via INTRAVENOUS

## 2015-10-29 MED ORDER — FENTANYL CITRATE (PF) 100 MCG/2ML IJ SOLN
INTRAMUSCULAR | Status: AC | PRN
  Administered 2015-10-29: 25 ug via INTRAVENOUS
  Administered 2015-10-29: 50 ug via INTRAVENOUS

## 2015-10-29 NOTE — H&P (Signed)
Chief Complaint: Patient was seen in consultation today for a CT guided left lung biopsy at the request of [place]  Referring Physician(s): Berenzon,Dmitriy  History of Present Illness: Cassandra [name] is a 82 y.o. female with recurrent bronchitis and left upper pulmonary nodule.  This nodule was detected many years ago and has been followed.  There has a been minimal growth between 11/14 and 06/16.  PET CT on 09/24/15 demonstrated equivocal SUV uptake at 1.7.  A slow growing neoplasm could not be excluded.  Patient is asymptomatic except for recent constipation.  Denies, chest pain, SOB, abdominal pain, or urinary problems.  Patient does not use oxygen or inhalers.  Past Medical History  Diagnosis Date  . Arthritis   . Diabetes mellitus without complication ([place])   . Glaucoma   . Hyperlipidemia   . Hypertension   . CAD (coronary artery disease)     Past Surgical History  Procedure Laterality Date  . Coronary angioplasty with stent placement    . Abdominal hysterectomy      Allergies: Atorvastatin; Cyclobenzaprine; Hydrocodone-acetaminophen; Oxycodone-acetaminophen; Paroxetine hcl; Penicillins; Propoxyphene; and Trazodone  Medications: Prior to Admission medications   Medication Sig Start Date End Date Taking? Authorizing Provider  aspirin EC 81 MG tablet Take 1 tablet by mouth daily.    Historical Provider, MD  azelastine (ASTELIN) 0.1 % nasal spray Place 2 sprays into the nose.    Historical Provider, MD  benazepril (LOTENSIN) 40 MG tablet Take 1 tablet (40 mg total) by mouth daily. 01/21/15   Crecencio Mc, MD  cetirizine (ZYRTEC) 10 MG tablet Take 1 tablet by mouth daily.    Historical Provider, MD  gabapentin (NEURONTIN) 300 MG capsule Take 1 capsule by mouth 3 (three) times daily.  01/05/15 04/05/15  Historical Provider, MD  glimepiride (AMARYL) 2 MG tablet Take 0.5 tablets by mouth daily. Take 0.5 tablet daily with largest meal 12/08/14   Historical Provider, MD   levothyroxine (SYNTHROID, LEVOTHROID) 75 MCG tablet Take 1 tablet by mouth daily. Take on an empty stomach 30 to 60 minutes before breakfast. 12/08/14   Historical Provider, MD  metoprolol succinate (TOPROL-XL) 50 MG 24 hr tablet Take 1 tablet by mouth daily. 11/02/14   Historical Provider, MD  rOPINIRole (REQUIP) 0.5 MG tablet Take 1 tablet by mouth 2 (two) times daily. 01/05/15 04/05/15  Historical Provider, MD  rosuvastatin (CRESTOR) 40 MG tablet Take 1 tablet by mouth daily. 01/07/15   Historical Provider, MD  timolol (BETIMOL) 0.5 % ophthalmic solution Apply 1 drop to eye daily. Place one 1 drop into both eyes every morning.    Historical Provider, MD  triamcinolone cream (KENALOG) 0.1 % APPLY TO AFFECTED AREA TWICE A DAY AS NEEDED 01/01/15   Historical Provider, MD     Family History  Problem Relation Age of Onset  . Cancer Mother   . Hypertension Mother   . Hypertension Father   . Early death Father   . Heart disease Father     Social History   Social History  . Marital Status: Married    Spouse Name: N/A  . Number of Children: N/A  . Years of Education: N/A   Social History Main Topics  . Smoking status: Former Smoker    Quit date: 01/21/1980  . Smokeless tobacco: Never Used  . Alcohol Use: 0.0 oz/week    0 Standard drinks or equivalent per week     Comment: occasionally  . Drug Use: No  . Sexual Activity: No  Other Topics Concern  . Not on file   Social History Narrative    ECOG Status: 0 - Asymptomatic  Review of Systems  Constitutional: Negative.   Respiratory: Negative.   Cardiovascular: Negative.   Gastrointestinal: Positive for constipation. Negative for abdominal pain.  Genitourinary: Negative.   Neurological:       Neuropathy in lower extremities     Vital Signs: There were no vitals taken for this visit.  Physical Exam  Cardiovascular: Normal rate, regular rhythm and normal heart sounds.  Exam reveals no gallop and no friction rub.   No murmur  heard. Pulmonary/Chest: Effort normal and breath sounds normal. No respiratory distress. She has no wheezes. She has no rales. She exhibits no tenderness.  Abdominal: Soft. Bowel sounds are normal. She exhibits no distension. There is no tenderness.    Mallampati Score:  MD Evaluation Airway: WNL Heart: WNL Abdomen: WNL Chest/ Lungs: WNL ASA  Classification: 1 Mallampati/Airway Score: Two  Imaging: No results found.  Labs:  CBC:  Recent Labs  10/26/15 0953  WBC 9.2  HGB 13.4  HCT 40.5  PLT 183    COAGS:  Recent Labs  10/26/15 0953  INR 0.99  APTT 32    BMP: No results for input(s): NA, K, CL, CO2, GLUCOSE, BUN, CALCIUM, CREATININE, GFRNONAA, GFRAA in the last 8760 hours.  Invalid input(s): CMP  LIVER FUNCTION TESTS: No results for input(s): BILITOT, AST, ALT, ALKPHOS, PROT, ALBUMIN in the last 8760 hours.  TUMOR MARKERS: No results for input(s): AFPTM, CEA, CA199, CHROMGRNA in the last 8760 hours.  Assessment and Plan:  82 yo female with a slowly enlarging nodule/lesion in left upper lobe.  This nodule is equivocal on PET CT and low grade neoplasm cannot be excluded.  I discussed the CT guided procedure with the patient and her husband in depth.  We discussed the risks of the procedure which include pneumothorax, hemoptysis and even death.  The patient is definitely anxious about the procedure and I explained to her that I thought it was reasonable to get a follow up CT in 6 months as an alternative to biopsy.  After a long discussion, patient decided that she wanted to know what this lesion is and wants to pursue the biopsy.  We will plan for a CT guided biopsy of left upper lobe lesion with moderate sedation.    Thank you for this interesting consult.  I greatly enjoyed meeting Cassandra [name] and look forward to participating in their care.  A copy of this report was sent to the requesting provider on this date.  Electronically Signed: Carylon Perches 10/29/2015, 9:51 AM

## 2015-10-29 NOTE — Sedation Documentation (Signed)
IV pulled out at end of procedure.

## 2015-10-29 NOTE — Procedures (Signed)
CT guided core biopsies of left upper lobe nodule.  2 cores obtained. Biosentry tract sealant used for needle removal.  No immediate complication.  Minimal blood loss.

## 2015-10-29 NOTE — Sedation Documentation (Signed)
Biosyntry used.

## 2015-11-02 ENCOUNTER — Ambulatory Visit: Payer: Medicare Other

## 2015-11-03 ENCOUNTER — Telehealth: Payer: Self-pay | Admitting: *Deleted

## 2015-11-03 ENCOUNTER — Inpatient Hospital Stay: Payer: Medicare Other

## 2015-11-03 LAB — SURGICAL PATHOLOGY

## 2015-11-03 NOTE — Telephone Encounter (Signed)
Called the patient's house and spoke to her husband and told him that the pathology which is the process of looking at her lung specimen under microscope to determine what it is has not resulted yet.  I spoke to [facility] in pathology and said it was sent out for staining which helps pathologist determine what it might be. The stains will be delivered today around lunch time and then 2 pathologists has to look and review and agree on results so the earliest it would be done is Friday.  This is not a guarantee that results will be done by then but it is estimate and so I would like to cancel today's appt to discuss results because we have no results.  Husband in agreement and gave appt for Friday. 2/10 at 1:30.  I did  Tell husband that I would check on specimen Friday morning when I get here and call if it is not back.  He states that pt is worried about it and this will make her more anxious.  In the time frame that I am typing this the patient called back and asked me why it is not back. Yet.  I explained to her that some pathology specimens is straight forward and comes back in 3 days and some others can take up to 2-3 weeks.  Until the pathologist gets staining done on specimen which should be back today and they reivew it to help determine what kind of cells it is, I can only update her about the process.  I realize that her anxiety is not being helped by this but coming over to the cancer center to get an answer we do not have might be more frustrating also.  She agrees she does not need to come but just wants her results and she is ok with the Friday appt. And the fact that i will check up on it on Friday am when I come and communicate with pt if it is not back

## 2015-11-05 ENCOUNTER — Inpatient Hospital Stay: Payer: Medicare Other

## 2015-11-05 ENCOUNTER — Other Ambulatory Visit: Payer: Self-pay | Admitting: *Deleted

## 2015-11-05 ENCOUNTER — Inpatient Hospital Stay: Payer: Medicare Other | Attending: Internal Medicine | Admitting: Internal Medicine

## 2015-11-05 ENCOUNTER — Telehealth: Payer: Self-pay | Admitting: *Deleted

## 2015-11-05 DIAGNOSIS — R918 Other nonspecific abnormal finding of lung field: Secondary | ICD-10-CM

## 2015-11-05 DIAGNOSIS — Z79899 Other long term (current) drug therapy: Secondary | ICD-10-CM | POA: Insufficient documentation

## 2015-11-05 DIAGNOSIS — I10 Essential (primary) hypertension: Secondary | ICD-10-CM | POA: Insufficient documentation

## 2015-11-05 DIAGNOSIS — E785 Hyperlipidemia, unspecified: Secondary | ICD-10-CM | POA: Diagnosis not present

## 2015-11-05 DIAGNOSIS — I2510 Atherosclerotic heart disease of native coronary artery without angina pectoris: Secondary | ICD-10-CM | POA: Diagnosis not present

## 2015-11-05 DIAGNOSIS — Z87891 Personal history of nicotine dependence: Secondary | ICD-10-CM | POA: Diagnosis not present

## 2015-11-05 DIAGNOSIS — A319 Mycobacterial infection, unspecified: Secondary | ICD-10-CM

## 2015-11-05 DIAGNOSIS — E119 Type 2 diabetes mellitus without complications: Secondary | ICD-10-CM | POA: Insufficient documentation

## 2015-11-05 DIAGNOSIS — M1990 Unspecified osteoarthritis, unspecified site: Secondary | ICD-10-CM | POA: Insufficient documentation

## 2015-11-05 DIAGNOSIS — E039 Hypothyroidism, unspecified: Secondary | ICD-10-CM | POA: Diagnosis not present

## 2015-11-05 DIAGNOSIS — R911 Solitary pulmonary nodule: Secondary | ICD-10-CM | POA: Diagnosis not present

## 2015-11-05 DIAGNOSIS — Z7982 Long term (current) use of aspirin: Secondary | ICD-10-CM | POA: Diagnosis not present

## 2015-11-05 NOTE — Telephone Encounter (Signed)
Called Cassandra [name] from her calling yest. And the message was given to her that we were seeing her today and we would call with update.  I called and left message with her stating that Dr. Rudean Hitt spoke to Dr. Ola Spurr ID today and he felt that it was not TB but pt should have quantiferon TB gold test and then needs induced sputum cult.  I told [name] that pt will need an appt with health dept for induced sputum and we would draw the tb  Blood test today. She called back later and told me she got message and will make an appt for pt on Monday.  i told pt while she was here about the health dept appt and that Dr Ola Spurr would be calling to make her an appt also.  She was agreeable.  Later after pt left, I got a call from our lab stating that when they sent the labs to upstairs they were called and told lab that the specimen can not be done on Friday. It has tobe sent to the place to test in 12 hours and it can't get there on Friday.   I have called Cassandra [name] back at health dept and left her a message that the above happened and see if they can draw it when she comes on Monday or else i will need to r/s it to be done here at our lab again. will await for call back

## 2015-11-06 NOTE — Progress Notes (Signed)
[place] @ [facility] Telephone:([phone]  Fax:([phone]     Alexzandria Oberle OB: 06-01-1927  MR#: [id number]  [id number]  Patient Care Team: Dion Body, MD as PCP - General (Family Medicine)  CHIEF COMPLAINT:  Chief Complaint  Patient presents with  . Lung Lesion     No history exists.    No flowsheet data found.  HISTORY OF PRESENT ILLNESS:   Mrs. Waechter is a very active 82 year old lady, who has a history of recurrent bronchitis, and a history of a left upper lobe nodule, detected on one chest x-ray at least 5 years ago. She had several follow-up imaging studies, while in [place], and reportedly, there was no progression of the nodule. After moving down to [place] 2 years ago, she was seen again for about of bronchitis, and chest x-ray again showed the presence of this nodule, and follow-up CT scans showed slow increase in size, which led to PET/CT scan, revealing mildly increased SUV of 1.7. The patient is referred to our clinic for additional evaluation. She underwent transthoracic CT-guided biopsy of the nodule last week.   Current status: Mrs. Mccosh returns to our clinic to discuss the results of the workup. She continues to feel well. Specifically, she has no cough, chest pain, shortness of breath, bone pain, hemoptysis, weight loss. She is active around the house .  REVIEW OF SYSTEMS:   Review of Systems  All other systems reviewed and are negative.    PAST MEDICAL HISTORY: Past Medical History  Diagnosis Date  . Arthritis   . Diabetes mellitus without complication ([place])   . Glaucoma   . Hyperlipidemia   . Hypertension   . CAD (coronary artery disease)   . Hypothyroidism     PAST SURGICAL HISTORY: Past Surgical History  Procedure Laterality Date  . Coronary angioplasty with stent placement    . Abdominal hysterectomy    . Foot surgery Bilateral     FAMILY HISTORY Family History  Problem Relation Age of Onset  . Cancer  Mother   . Hypertension Mother   . Hypertension Father   . Early death Father   . Heart disease Father     ADVANCED DIRECTIVES:  No flowsheet data found.  HEALTH MAINTENANCE: Social History  Substance Use Topics  . Smoking status: Former Smoker    Quit date: 01/21/1980  . Smokeless tobacco: Never Used  . Alcohol Use: 0.0 oz/week    0 Standard drinks or equivalent per week     Comment: occasionally     Allergies  Allergen Reactions  . Atorvastatin Other (See Comments)    MYALGIA  . Cyclobenzaprine Other (See Comments)  . Hydrocodone-Acetaminophen     Other reaction(s): Hallucination when taking whole pill, pt tolerates taking 1/2 pill  . Oxycodone-Acetaminophen Other (See Comments)  . Paroxetine Hcl Other (See Comments)  . Penicillins Swelling    Lip and orbital swelling  . Propoxyphene Other (See Comments)  . Trazodone Other (See Comments)    Current Outpatient Prescriptions  Medication Sig Dispense Refill  . aspirin EC 81 MG tablet Take 1 tablet by mouth daily.    Marland Kitchen azelastine (ASTELIN) 0.1 % nasal spray Place 2 sprays into the nose.    . benazepril (LOTENSIN) 40 MG tablet Take 1 tablet (40 mg total) by mouth daily. 90 tablet 3  . cetirizine (ZYRTEC) 10 MG tablet Take 1 tablet by mouth daily.    Marland Kitchen glimepiride (AMARYL) 2 MG tablet Take 0.5 tablets by mouth daily.  Take 0.5 tablet daily with largest meal    . metoprolol succinate (TOPROL-XL) 50 MG 24 hr tablet Take 1 tablet by mouth daily.    . rosuvastatin (CRESTOR) 40 MG tablet Take 1 tablet by mouth daily.    . timolol (BETIMOL) 0.5 % ophthalmic solution Apply 1 drop to eye daily. Place one 1 drop into both eyes every morning.    . triamcinolone cream (KENALOG) 0.1 % APPLY TO AFFECTED AREA TWICE A DAY AS NEEDED    . gabapentin (NEURONTIN) 300 MG capsule Take 1 capsule by mouth 3 (three) times daily.     Marland Kitchen levothyroxine (SYNTHROID, LEVOTHROID) 75 MCG tablet Take 1 tablet by mouth daily. Take on an empty stomach 30 to  60 minutes before breakfast.    . rOPINIRole (REQUIP) 0.5 MG tablet Take 1 tablet by mouth 2 (two) times daily.     No current facility-administered medications for this visit.    OBJECTIVE:  There were no vitals filed for this visit.   There is no weight on file to calculate BMI.    ECOG FS:0 - Asymptomatic  Physical Exam  Constitutional: She is oriented to person, place, and time and well-developed, well-nourished, and in no distress. No distress.  Younger than stated age, obese African-American female  HENT:  Head: Normocephalic and atraumatic.  Right Ear: External ear normal.  Left Ear: External ear normal.  Mouth/Throat: Oropharynx is clear and moist.  Eyes: Conjunctivae are normal. Pupils are equal, round, and reactive to light. Right eye exhibits no discharge. Left eye exhibits no discharge. No scleral icterus.  Neck: Normal range of motion. Neck supple. No JVD present. No tracheal deviation present. No thyromegaly present.  Cardiovascular: Normal rate, regular rhythm, normal heart sounds and intact distal pulses.  Exam reveals no gallop and no friction rub.   No murmur heard. Pulmonary/Chest: Effort normal and breath sounds normal. No stridor. No respiratory distress. She has no wheezes. She has no rales. She exhibits no tenderness.  Abdominal: Soft. Bowel sounds are normal. She exhibits no distension and no mass. There is no tenderness. There is no rebound and no guarding.  Genitourinary:  Postponed  Musculoskeletal: Normal range of motion. She exhibits no edema or tenderness.  Lymphadenopathy:    She has no cervical adenopathy.  Neurological: She is alert and oriented to person, place, and time. She has normal reflexes. No cranial nerve deficit. She exhibits normal muscle tone. Gait normal. Coordination normal. GCS score is 15.  Skin: Skin is warm. No rash noted. She is not diaphoretic. No erythema. No pallor.  Psychiatric: Mood, memory, affect and judgment normal.  Nursing  note and vitals reviewed.    LAB RESULTS:  CBC Latest Ref Rng 10/26/2015 12/07/2012  WBC 3.6 - 11.0 K/uL 9.2 9.6  Hemoglobin 12.0 - 16.0 g/dL 13.4 11.9(L)  Hematocrit 35.0 - 47.0 % 40.5 37.2  Platelets 150 - 440 K/uL 183 216    No visits with results within 5 Day(s) from this visit. Latest known visit with results is:  Hospital Outpatient Visit on 10/29/2015  Component Date Value Ref Range Status  . Glucose-Capillary 10/29/2015 96  65 - 99 mg/dL Final  . SURGICAL PATHOLOGY 10/29/2015    Final                   Value:Surgical Pathology CASE: [id number] PATIENT: Cieanna Schepers Surgical Pathology Report     SPECIMEN SUBMITTED: A. Lung lesion, left upper lobe  CLINICAL HISTORY: Slowly enlarging left lung lesion,  low uptake on PET  PRE-OPERATIVE DIAGNOSIS: Indeterminate left lung lesion  POST-OPERATIVE DIAGNOSIS: Same as pre-op     DIAGNOSIS: A. LUNG LESION, LEFT UPPER LOBE; CT-GUIDED BIOPSY: - EPITHELIOID GRANULOMATOUS INFLAMMATION WITH CASEATING NECROSIS. - RARE AFB POSITIVE ORGANISMS PRESENT. - NEGATIVE FOR FUNGUS; GMS STAIN EXAMINED. - POLARIZABLE MATERIAL IS NOT IDENTIFIED. - NEGATIVE FOR VASCULITIS.  Comment: Stain controls worked appropriately. Recommend additional laboratory work up to evaluate possibility of mycobacteria tuberculosis.   GROSS DESCRIPTION: A. Labeled: Left upper lobe lung lesion  Tissue fragment(s): 4  Size: 0.4 x 0.3 x 0.2 cm  Description: Fragments of pale tan to white soft tissue  Entirely submitted in one cassette(s).                              Final Diagnosis performed by Quay Burow, MD.  Electronically signed 11/03/2015 4:05:10PM    The electronic signature indicates that the named Attending Pathologist has evaluated the specimen  Technical component performed at [facility], [address], [place], [place] [address] Lab: [phone] Dir: Darrick Penna. Evette Doffing, MD  Professional component performed at [facility],  [facility], [place], [place], [place] [address] Lab: [phone] Dir: Dellia Nims. Rubinas, MD      STUDIES: Dg Chest 1 View  10/29/2015  CLINICAL DATA:  Post left lung biopsy. EXAM: CHEST 1 VIEW COMPARISON:  Chest CT 10/29/2015 FINDINGS: There is a faint density in the periphery of the left lung. This is compatible with the recently biopsied lesion. No evidence for a pneumothorax. Few densities at the right lung base are suggestive for atelectasis. Heart and mediastinum are within normal limits. Atherosclerotic calcifications at the aortic arch. IMPRESSION: Negative for pneumothorax following the left lung biopsy. Electronically Signed   By: Markus Daft M.D.   On: 10/29/2015 12:50   Ct Biopsy  10/29/2015  INDICATION: 82 year old female with a slowly enlarging left upper lobe lesion. Request for percutaneous biopsy. EXAM: CT GUIDED BIOPSY OF LEFT UPPER LOBE LESION MEDICATIONS: None. ANESTHESIA/SEDATION: Moderate (conscious) sedation was employed during this procedure. A total of Versed 3.0 mg and Fentanyl 75 mcg was administered intravenously. Moderate Sedation Time: 24 minutes. The patient's level of consciousness and vital signs were monitored continuously by radiology nursing throughout the procedure under my direct supervision. FLUOROSCOPY TIME:  None COMPLICATIONS: None immediate. PROCEDURE: Informed written consent was obtained from the patient after a thorough discussion of the procedural risks, benefits and alternatives. All questions were addressed. Patient was placed on her right side. Images through the chest were obtained. The lesion in the left upper lobe was identified. The left side of the chest was marked and prepped with chlorhexidine. A sterile field was created. Skin and soft tissues were anesthetized with 1% lidocaine. A 17 gauge needle was directed into the lesion with CT guidance. Two core biopsies were obtained with an 18 gauge device. Specimens were placed  in formalin. The coaxial needle was removed using the Biosentry tract sealant. Follow up CT images were obtained. Bandage was placed at the puncture site. FINDINGS: There is an elongated lesion along the posterior aspect of the left upper lobe. This measures 1.7 cm in length and 0.8 cm in width. Needle position was confirmed within the lesion. Two core biopsies were obtained. Minimal parenchymal hemorrhage following the core biopsies. Tiny focus of pleural air at the puncture site but no significant pneumothorax. IMPRESSION: Successful CT-guided core biopsy of the left upper lobe lung lesion.  Electronically Signed   By: Markus Daft M.D.   On: 10/29/2015 12:43    ASSESSMENT and MEDICAL DECISION MAKING:    PET Positive left upper lobe nodule-the biopsy of the nodule reliably ruled out lung cancer, however, it revealed the findings, concerning for mycobacterial infection. Mrs. Shayne was in contact with tuberculosis approximately 84 years ago, when her sister was diagnosed with pulmonary tuberculosis, however, she personally has never been diagnosed with tuberculosis. Clinically she continues to do remarkably well, and even if mycobacterial infection turns out to be mycobacterium tuberculosis, she is unlikely to be infectious in the absence of cough and sputum. Still, we performed QuantiFERON Gold testing, and referred [facility] to [facility] for evaluation, as well as to Dr. Ola Spurr (ID) for additional evaluation. We did not give Mrs. Syfert a follow-up appointment, since then nodule of concern turned out to be inflammatory, rather than malignant, and as such, no additional imaging studies from our side is needed. However, if in the future additional problems emerge, she can be referred to [place] for evaluation.  Patient expressed understanding and was in agreement with this plan. She also understands that She can call clinic at any time with any questions, concerns, or complaints.    No matching  staging information was found for the patient.  Roxana Hires, MD   11/06/2015 9:23 AM

## 2015-11-08 ENCOUNTER — Telehealth: Payer: Self-pay | Admitting: *Deleted

## 2015-11-08 NOTE — Telephone Encounter (Signed)
Called back to [place] this am and she states that they do not do quantaferin TB gold but she will do tb skin test and have her checked Thursday at the health dept.Informed md that tb skin test will be done.

## 2015-11-11 ENCOUNTER — Telehealth: Payer: Self-pay | Admitting: *Deleted

## 2015-11-11 NOTE — Telephone Encounter (Signed)
Sharyn Lull called to say that the TB skin test was resulted today and it was negative.  She is sending out 2 sputum specimens today to the state and when she gets results she will send it to me.  She has also made Dr. Verita Lamb the same results.

## 2015-11-24 ENCOUNTER — Other Ambulatory Visit: Payer: Self-pay | Admitting: Family Medicine

## 2015-11-24 DIAGNOSIS — M5416 Radiculopathy, lumbar region: Secondary | ICD-10-CM

## 2015-12-10 ENCOUNTER — Ambulatory Visit: Payer: Medicare Other

## 2016-01-05 ENCOUNTER — Ambulatory Visit
Admission: RE | Admit: 2016-01-05 | Discharge: 2016-01-05 | Disposition: A | Discharge status: Home / Self Care | Payer: Medicare Other | Source: Ambulatory Visit | Attending: Family Medicine | Admitting: Family Medicine

## 2016-01-05 DIAGNOSIS — M5416 Radiculopathy, lumbar region: Secondary | ICD-10-CM | POA: Diagnosis present

## 2016-01-05 DIAGNOSIS — M4806 Spinal stenosis, lumbar region: Secondary | ICD-10-CM | POA: Diagnosis not present

## 2016-01-05 DIAGNOSIS — M5126 Other intervertebral disc displacement, lumbar region: Secondary | ICD-10-CM | POA: Insufficient documentation

## 2016-05-11 ENCOUNTER — Encounter: Payer: Self-pay | Admitting: Physical Therapy

## 2016-05-11 ENCOUNTER — Ambulatory Visit: Payer: Medicare Other | Attending: Physical Medicine and Rehabilitation | Admitting: Physical Therapy

## 2016-05-11 DIAGNOSIS — R279 Unspecified lack of coordination: Secondary | ICD-10-CM | POA: Diagnosis present

## 2016-05-11 DIAGNOSIS — M5441 Lumbago with sciatica, right side: Secondary | ICD-10-CM | POA: Insufficient documentation

## 2016-05-11 DIAGNOSIS — M6281 Muscle weakness (generalized): Secondary | ICD-10-CM | POA: Insufficient documentation

## 2016-05-11 NOTE — Therapy (Signed)
[place] MAIN [facility] SERVICES [address] [place], [place], [address] Phone: [phone]   Fax:  [phone]  Physical Therapy Evaluation  Patient Details  Name: Cassandra [name] MRN: [id number] Date of Birth: 1926-11-07 Referring Provider: Dr. Sharlet Salina   Encounter Date: 05/11/2016      PT End of Session - 05/11/16 1326    Visit Number 1   Number of Visits 12   Date for PT Re-Evaluation 07/27/16   Authorization Type g-code 1/10   PT Start Time 1320   PT Stop Time 1425   PT Time Calculation (min) 65 min   Activity Tolerance Patient tolerated treatment well;No increased pain   Behavior During Therapy WFL for tasks assessed/performed      Past Medical History:  Diagnosis Date  . Arthritis    osteoarthritis in hands, knees ( R torn cartilage, L meniscus removed)   . CAD (coronary artery disease)   . Diabetes mellitus without complication ([place])   . Glaucoma   . Hyperlipidemia   . Hypertension   . Hypothyroidism     Past Surgical History:  Procedure Laterality Date  . ABDOMINAL HYSTERECTOMY    . CORONARY ANGIOPLASTY WITH STENT PLACEMENT    . FOOT SURGERY Bilateral   . KNEE ARTHROSCOPY W/ MENISCECTOMY Left    Also has R knee injury (torn cartilage)     There were no vitals filed for this visit.       Subjective Assessment - 05/11/16 1903    Subjective 1) Pt reports CLBP since 1982 after falling down her steps without injury. Years later, pt was informed by her MD she had dislocated her discs in  low back pain.  Currently, LBP feels " like someone jammed/ punched her" where she points to (L5/S) wrapping to lateral side of R mid- thigh.  On average pain 6-7/10. At its worst 8-9/10 with lifting heavy pots, stretching/pulling anything heavy when shopping, stairclimbing .  Taking pain meds 4-5x week.  Pt continues to manage with her ADLs and tolerate the pain. Pt gets 5hr of interrupted sleep due to pain. Pt 's received two epidural  shots which did not give long term relief.  Pt is considering dry-needling.    2) constipation : bowel movements occur every other day. Bristol Scale Type 1 (65-75 % of the time) and 4 ( 25% of the time). Straining "alot of the time. Pt works on [employer].    3) Amalia [place]house Urination: 2-3x / hr. Nocturia 3x/ night. Denied having had a sleep study perform in the past. Husband says she snores. Hx of cardiac condition and stent placement. Denied SUI. Urge incontinence during the night.                 [facility] PT Assessment - 05/11/16 1413      Assessment   Medical Diagnosis LBP   Referring Provider Dr. Sharlet Salina      Precautions   Precautions None     Restrictions   Weight Bearing Restrictions No     Balance Screen   Has the patient fallen in the past 6 months No     [place] residence   Additional Comments 21 stairs  two  rails      Prior Function   Level of Independence Independent     Observation/Other Assessments   Observations ankles cross under chair    Other Surveys  --  ODI 18% , PFDI 36%  Sensation   Light Touch --  decreased sensation L4 on Left > R  (tibia )     Coordination   Gross Motor Movements are Fluid and Coordinated --  chest breathing   Fine Motor Movements are Fluid and Coordinated --  pelvic floor/ abdominal strain w/ cue for urination/ bowel m     AROM   Overall AROM Comments 10% deg forward flexion and R sidebend  before p! ,   post Tx: forward flexion/ R sidebend 30% , no p!      Strength   Overall Strength Comments Gross BLE 4/5 (concordant sign  with R hip flexion)      Palpation   Spinal mobility increased midback mm tensions and hypomobility   SI assessment  L PSIS more posterior, limited mobility   Palpation comment increased scar adhesions above and below umbilicus, abdominal bulging above umbilicus wi/ bed mobility and head lift in supine     Ambulation/Gait   Gait velocity 0.9  m/s                    [facility] Adult PT Treatment/Exercise - 05/11/16 1413      Therapeutic Activites    Therapeutic Activities --  see pt instructions     Neuro Re-ed    Neuro Re-ed Details  breathing coordination with pelvic floor lengthening, and see additional pt instructions      Manual Therapy   Manual therapy comments L long axis distraction, inferior/ sup mob of sacrum,                 PT Education - 05/11/16 1633    Education provided Yes   Education Details HEP, anatomy/physiology, goals, POC   Person(s) Educated Patient   Methods Explanation;Demonstration;Tactile cues;Verbal cues;Handout   Comprehension Verbalized understanding;Returned demonstration             PT Long Term Goals - 05/11/16 1409      PT LONG TERM GOAL #1   Title Pt will be able to walk 20 min for 2-3 x week with < 2/10 LBP in order to lose weight and manage chronic diseases   Time 12   Period Weeks   Status New     PT LONG TERM GOAL #2   Title Pt decrease frequency urination from 2x/ hr to 1x/ every 2 hrs in order improve QOL   Time 12   Period Weeks   Status New     PT LONG TERM GOAL #3   Title Pt will decrease her ODI score from 18   % to  < 15 %  In order to walk up her stairs and lift groceries    Time 12   Period Weeks   Status New     PT LONG TERM GOAL #4   Title Pt will increase bowel movements from every over day to daily with Stool Type 4 for 75% of the time.    Time 12   Period Weeks   Status New     PT LONG TERM GOAL #5   Title Pt will report improve sleep quality uninterrupted by pain across 1 week in order to and gai nphysiological benefits of sleep   Time 12   Period Weeks   Status New     Additional Long Term Goals   Additional Long Term Goals Yes     PT LONG TERM GOAL #6   Title Pt will report decreased score on PFDI from 36% to <  29% in order to demo improved pelvic floor function    Time 12   Period Weeks   Status New                Plan - 05-19-2016 1633    Clinical Impression Statement Pt is an 82 yo female who c/o CLBP, constipation, frequent urination and nocturia. Pt's clincial presentations include poor loading ability of pelvic girdle with poor intraabdominal pressure (i.e. abdominal bulging with head / hip lift in supine, pain with seated hip flexion, pain with various gravity loaded activities, paradoxical coordination of pelvic floor mm, abdominal scar restrictions, pelvic obliquities)  in addition to limited spinal ROM, decreased sensation in L LE,  poor body mechanics, and decreased balance .  Following Tx to address pelvic obliquity and poor body  mechanics with sitting and sit to stand , pt showed increased spinal ROM , proper breathing and pelvic floor coordination, and decreased pain from   6-7/10 to 3-4/10.  Pt reported feeling better by 65% at the end of the session.     Rehab Potential Good   Clinical Impairments Affecting Rehab Potential co-morbidities, abdominal surgeries, Hx of fall    PT Frequency 2x / week   PT Duration 12 weeks   PT Treatment/Interventions ADLs/Self Care Home Management;Aquatic Therapy;Cryotherapy;Lobbyist;Therapeutic exercise;Therapeutic activities;Functional mobility training;Stair training;Neuromuscular re-education;Patient/family education;Scar mobilization;Manual lymph drainage;Manual techniques;Energy conservation;Taping;Gait training;Moist Heat   Consulted and Agree with Plan of Care Patient      Patient will benefit from skilled therapeutic intervention in order to improve the following deficits and impairments:  Pain, Improper body mechanics, Impaired sensation, Increased fascial restricitons, Increased muscle spasms, Postural dysfunction, Hypermobility, Decreased scar mobility, Decreased mobility, Decreased coordination, Decreased balance, Decreased range of motion, Decreased endurance, Decreased activity tolerance, Decreased  strength, Hypomobility  Visit Diagnosis: Muscle weakness (generalized)  Lumbago with sciatica, right side  Unspecified lack of coordination      G-Codes - 05/19/16 1632    Functional Assessment Tool Used ODI 18% , PFDI 36%    Functional Limitation Carrying, moving and handling objects;Self care   Carrying, Moving and Handling Objects Current Status [phone]) At least 1 percent but less than 20 percent impaired, limited or restricted   Carrying, Moving and Handling Objects Goal Status [id number]) At least 1 percent but less than 20 percent impaired, limited or restricted   Self Care Current Status [id number]) At least 20 percent but less than 40 percent impaired, limited or restricted   Self Care Goal Status [id number]) At least 20 percent but less than 40 percent impaired, limited or restricted       Problem List Patient Active Problem List   Diagnosis Date Noted  . Edema 01/24/2015  . Essential hypertension 01/24/2015  . Type 2 DM with diabetic neuropathy affecting both sides of body ([place]) 01/24/2015  . Restless legs syndrome 01/24/2015  . Chronic constipation 01/24/2015    Jerl Mina ,PT, DPT, E-RYT  05-19-16, 7:10 PM  [place] MAIN [facility] SERVICES [address] [place], [place], [address] Phone: [phone]   Fax:  [phone]  Name: Cassandra [name] MRN: [id number] Date of Birth: 1926/12/28

## 2016-05-11 NOTE — Patient Instructions (Addendum)
Decrease strain on back    Avoid holding your breath when Getting out of the chair:  Scoot to front half of the chair Heels under knee, nose over toes  Inhale like you are smelling roses Exhale to stand    Proper sitting posture  Feet on floor, Heel directly under knees    Do not strain with bowel movements, Breathe on the toilet instead of straining

## 2016-05-12 ENCOUNTER — Ambulatory Visit: Payer: Medicare Other | Admitting: Physical Therapy

## 2016-05-19 ENCOUNTER — Ambulatory Visit: Payer: Medicare Other | Admitting: Physical Therapy

## 2016-05-19 DIAGNOSIS — R279 Unspecified lack of coordination: Secondary | ICD-10-CM

## 2016-05-19 DIAGNOSIS — M6281 Muscle weakness (generalized): Secondary | ICD-10-CM

## 2016-05-19 DIAGNOSIS — M5441 Lumbago with sciatica, right side: Secondary | ICD-10-CM

## 2016-05-19 NOTE — Therapy (Signed)
[place] MAIN [facility] SERVICES [address] [place], [place], [address] Phone: [phone]   Fax:  [phone]  Physical Therapy Treatment  Patient Details  Name: Cassandra [name] MRN: [id number] Date of Birth: July 22, 1927 Referring Provider: Dr. Sharlet Salina   Encounter Date: 05/19/2016      PT End of Session - 05/19/16 1521    Visit Number 2   Number of Visits 12   Date for PT Re-Evaluation 07/27/16   Authorization Type g-code 2/10   PT Start Time 1130   PT Stop Time 1235   PT Time Calculation (min) 65 min   Activity Tolerance Patient tolerated treatment well;No increased pain   Behavior During Therapy WFL for tasks assessed/performed      Past Medical History:  Diagnosis Date  . Arthritis    osteoarthritis in hands, knees ( R torn cartilage, L meniscus removed)   . CAD (coronary artery disease)   . Diabetes mellitus without complication ([place])   . Glaucoma   . Hyperlipidemia   . Hypertension   . Hypothyroidism     Past Surgical History:  Procedure Laterality Date  . ABDOMINAL HYSTERECTOMY    . CORONARY ANGIOPLASTY WITH STENT PLACEMENT    . FOOT SURGERY Bilateral   . KNEE ARTHROSCOPY W/ MENISCECTOMY Left    Also has R knee injury (torn cartilage)     There were no vitals filed for this visit.      Subjective Assessment - 05/19/16 1135    Subjective Pt report has been practicing her sit tostand and toileting techniques. Pt reported she had decreased radiating R hip  pain since last session. Pt's radiating pain occurred 50% of the time during 1 week period and since last Tx, it has occured 10% of the week.    Pertinent History DIGESTIVE SYSTEM: 2010 pt underwent a colonscopy and her colon became perforated which led her to undergo surgery and wearing a colostomy for 4 months. GYNECOLOGY HX:  abdominal hysterectomy 2/2 fibroid tumors .  3 vaginal deliveries without injury. NEURO SYSTEM:  Neuropathy in BLE. Additional MSK: Arthritis  in hands, B foot/ knee surgery,       Patient Stated Goals be able to workout in the pool, walk 20 min 2-3 x/ week with < 2/10 LBP             [facility] PT Assessment - 05/19/16 1225      Coordination   Fine Motor Movements are Fluid and Coordinated --  paradoxical coordinatin of pelvic floor     Palpation   SI assessment  symmtrical pelvic girdle    Palpation comment decreased scar adhesions post Tx                      [facility] Adult PT Treatment/Exercise - 05/19/16 1227      Neuro Re-ed    Neuro Re-ed Details  pelvic floor coordination      Manual Therapy   Manual therapy comments manual lymph drainage (light depth) at abdomen, pect, upper trap, groin, leg.  scar mobilization and abdominal massage                      PT Long Term Goals - 05/11/16 1409      PT LONG TERM GOAL #1   Title Pt will be able to walk 20 min for 2-3 x week with < 2/10 LBP in order to lose weight and manage chronic diseases   Time  12   Period Weeks   Status New     PT LONG TERM GOAL #2   Title Pt decrease frequency urination from 2x/ hr to 1x/ every 2 hrs in order improve QOL   Time 12   Period Weeks   Status New     PT LONG TERM GOAL #3   Title Pt will decrease her ODI score from 18   % to  < 15 %  In order to walk up her stairs and lift groceries    Time 12   Period Weeks   Status New     PT LONG TERM GOAL #4   Title Pt will increase bowel movements from every over day to daily with Stool Type 4 for 75% of the time.    Time 12   Period Weeks   Status New     PT LONG TERM GOAL #5   Title Pt will report improve sleep quality uninterrupted by pain across 1 week in order to and gai nphysiological benefits of sleep   Time 12   Period Weeks   Status New     Additional Long Term Goals   Additional Long Term Goals Yes     PT LONG TERM GOAL #6   Title Pt will report decreased score on PFDI from 36% to < 29% in order to demo improved pelvic floor function    Time  12   Period Weeks   Status New               Plan - 05/19/16 1522    Clinical Impression Statement Pt reported her LBP is less with body mechanics training from her first session. Pt reported  feeling more relaxed in her abdominal area following manual Tx and she expressed willingness to view her previous abdominal scars with more positivity compared to the negative body -image perspectives she had in the past. Noted borborygmus during abdominal manual Tx. Anticipate pt's bowel movements will improve as scar adhesions decrease. Plan to incorporate deep core strengthening at next session to improve low back pain.  Pt continues to progress well towards her goals.    Rehab Potential Good   Clinical Impairments Affecting Rehab Potential co-morbidities, abdominal surgeries    PT Frequency 2x / week   PT Duration 12 weeks   PT Treatment/Interventions ADLs/Self Care Home Management;Aquatic Therapy;Cryotherapy;Lobbyist;Therapeutic exercise;Therapeutic activities;Functional mobility training;Stair training;Neuromuscular re-education;Patient/family education;Scar mobilization;Manual lymph drainage;Manual techniques;Energy conservation;Taping;Gait training;Moist Heat   Consulted and Agree with Plan of Care Patient      Patient will benefit from skilled therapeutic intervention in order to improve the following deficits and impairments:  Pain, Improper body mechanics, Impaired sensation, Increased fascial restricitons, Increased muscle spasms, Postural dysfunction, Hypermobility, Decreased scar mobility, Decreased mobility, Decreased coordination, Decreased balance, Decreased range of motion, Decreased endurance, Decreased activity tolerance, Decreased strength, Hypomobility  Visit Diagnosis: No diagnosis found.     Problem List Patient Active Problem List   Diagnosis Date Noted  . Edema 01/24/2015  . Essential hypertension 01/24/2015  . Type 2 DM with diabetic  neuropathy affecting both sides of body ([place]) 01/24/2015  . Restless legs syndrome 01/24/2015  . Chronic constipation 01/24/2015    Jerl Mina ,PT, DPT, E-RYT  05/19/2016, 3:27 PM  [place] MAIN [facility] SERVICES [address] [place], [place], [address] Phone: [phone]   Fax:  [phone]  Name: Cassandra [name] MRN: [id number] Date of Birth: 1926-11-18

## 2016-05-19 NOTE — Patient Instructions (Signed)
Self-massage (handout)   Toileting: feet up on stool or toilet paper package and lowering of pelvic floor muscles with inhalation

## 2016-05-22 ENCOUNTER — Ambulatory Visit: Payer: Medicare Other | Admitting: Physical Therapy

## 2016-06-02 ENCOUNTER — Ambulatory Visit: Payer: Medicare Other | Admitting: Physical Therapy

## 2016-06-07 ENCOUNTER — Ambulatory Visit: Payer: Medicare Other | Attending: Physical Medicine and Rehabilitation | Admitting: Physical Therapy

## 2016-06-07 DIAGNOSIS — R279 Unspecified lack of coordination: Secondary | ICD-10-CM | POA: Diagnosis present

## 2016-06-07 DIAGNOSIS — M5441 Lumbago with sciatica, right side: Secondary | ICD-10-CM | POA: Diagnosis present

## 2016-06-07 DIAGNOSIS — M6281 Muscle weakness (generalized): Secondary | ICD-10-CM | POA: Insufficient documentation

## 2016-06-07 NOTE — Patient Instructions (Addendum)
1) In addition massage,  Zig zag across the tight areas of the scars (5 min)        2) practice 3 breaths, exhale with simulated cough      Then in real time with coughing, lift pelvic floor and belly mm hug in                       3)  You are now ready to begin training the deep core muscles system: diaphragm, transverse abdominis, pelvic floor . These muscles must work together as a team.           The key to these exercises to train the brain to coordinate the timing of these muscles and to have them turn on for long periods of time to hold you upright against gravity (especially important if you are on your feet all day).These muscles are postural muscles and play a role stabilizing your spine and bodyweight. By doing these repetitions slowly and correctly instead of doing crunches, you will achieve a flatter belly without a lower pooch. You are also placing your spine in a more neutral position and breathing properly which in turn, decreases your risk for problems related to your pelvic floor, abdominal, and low back such as pelvic organ prolapse, hernias, diastasis recti (separation of superficial muscles), disk herniations, spinal fractures. These exercises set a solid foundation for you to later progress to resistance/ strength training with therabands and weights and return to other typical fitness exercises with a stronger deeper core.   Do level 2 only 10 reps x 3  = 30 reps   morning and also at night

## 2016-06-07 NOTE — Therapy (Signed)
[place] MAIN [facility] SERVICES [address] [place], [place], [address] Phone: [phone]   Fax:  [phone]  Physical Therapy Treatment / Progress Note  Patient Details  Name: Cassandra [name] MRN: [id number] Date of Birth: [birth year] Referring Provider: Dr. Sharlet Salina   Encounter Date: 06/07/2016      PT End of Session - 06/07/16 1112    Visit Number 3   Number of Visits 12   Date for PT Re-Evaluation 07/27/16   Authorization Type g-code 3/10   PT Start Time 1020   PT Stop Time 1115   PT Time Calculation (min) 55 min   Activity Tolerance Patient tolerated treatment well;No increased pain   Behavior During Therapy WFL for tasks assessed/performed      Past Medical History:  Diagnosis Date  . Arthritis    osteoarthritis in hands, knees ( R torn cartilage, L meniscus removed)   . CAD (coronary artery disease)   . Diabetes mellitus without complication ([place])   . Glaucoma   . Hyperlipidemia   . Hypertension   . Hypothyroidism     Past Surgical History:  Procedure Laterality Date  . ABDOMINAL HYSTERECTOMY    . CORONARY ANGIOPLASTY WITH STENT PLACEMENT    . FOOT SURGERY Bilateral   . KNEE ARTHROSCOPY W/ MENISCECTOMY Left    Also has R knee injury (torn cartilage)     There were no vitals filed for this visit.      Subjective Assessment - 06/07/16 1026    Subjective Pt reported she has been practicing the breathing which has helped her to make it to the toilet in time without leakage. Pt reports also her nocturia as improved by shifting her  intake of her medications at 8 pm instead of later to stop her intake of water at an earlier time.  Pt reported after last session, two weeks ago, pt was able to have regular bowel movements for 2 days. After that, pt then came constipated again when she got sick with a sinus infection with repeated coughing. Pt has recovered the sinus infection. Currently, pt feels achiness over her L back mm,  and also puffiness over her abdomen. She will plan to take [place] (laxative)  if she does not see improvement.     Pertinent History DIGESTIVE SYSTEM: 2010 pt underwent a colonscopy and her colon became perforated which led her to undergo surgery and wearing a colostomy for 4 months. GYNECOLOGY HX:  abdominal hysterectomy 2/2 fibroid tumors .  3 vaginal deliveries without injury. NEURO SYSTEM:  Neuropathy in BLE. Additional MSK: Arthritis in hands, B foot/ knee surgery,       Patient Stated Goals be able to workout in the pool, walk 20 min 2-3 x/ week with < 2/10 LBP             [facility] PT Assessment - 06/07/16 1105      Palpation   Palpation comment decreased scar restractions except above umbilicus and at supra pubic level  slight tensions along L QL and EO. IO > R (decreased post Tx                     [facility] Adult PT Treatment/Exercise - 06/07/16 1107      Neuro Re-ed    Neuro Re-ed Details  see pt instructions     Manual Therapy   Manual therapy comments manual lymph drainage (light depth) at abdomen/ lumbar and scar massage  PT Education - 06/07/16 1112    Education provided Yes   Education Details HEP   Person(s) Educated Patient   Methods Explanation;Demonstration;Tactile cues;Verbal cues;Handout   Comprehension Verbalized understanding;Returned demonstration             PT Long Term Goals - 06/07/16 1031      PT LONG TERM GOAL #1   Title Pt will be able to walk 20 min for 2-3 x week with < 2/10 LBP in order to lose weight and manage chronic diseases   Time 12   Period Weeks   Status Achieved     PT LONG TERM GOAL #2   Title Pt decrease frequency urination from 2x/ hr to 1x/ every 2 hrs in order improve QOL   Time 12   Period Weeks   Status Achieved     PT LONG TERM GOAL #3   Title Pt will decrease her ODI score from 18   % to  < 15 %  In order to walk up her stairs and lift groceries    Time 12   Period Weeks   Status  New     PT LONG TERM GOAL #4   Title Pt will increase bowel movements from every over day to daily with Stool Type 4 for 75% of the time.    Time 12   Period Weeks   Status New     PT LONG TERM GOAL #5   Title Pt will report improve sleep quality uninterrupted by pain across 1 week in order to and gai nphysiological benefits of sleep   Time 12   Period Weeks   Status New     PT LONG TERM GOAL #6   Title Pt will report decreased score on PFDI from 36% to < 29% in order to demo improved pelvic floor function    Time 12   Period Weeks   Status New               Plan - 06/07/16 1113    Clinical Impression Statement Pt has achieved 2/6 goals across 3 visits.Pt's LBP has improved and she has been able to return to loaded acitvities such as walking and sit to stand transfers with less pain.  Pt's urge incontinence has improved. Pt's constipation improved after the last session but she had a relapse after getting sick. Her increased functional mobility and pelvic floor function has improved with manual Tx and HEP that decreased her abdominal scar adhesions in addition to neuro-reeducation on body mechanics and coordination of her deep core mm.   Pt reported today's Tx made her feel better and she feels more relaxed. Initiated deep core coordination/ strengthening today. Pt remains on a once a week frequency for one more week and will decrease to 1x/ every 2 weeks as she enters the strengthening phase of her rehab program.   Pt will continue to benefit from skilled PT. Anticipate she will achieve her goals given her compliance and motivation.    Rehab Potential Good   Clinical Impairments Affecting Rehab Potential co-morbidities, abdominal surgeries    PT Frequency 1x / week   PT Duration 12 weeks   PT Treatment/Interventions ADLs/Self Care Home Management;Aquatic Therapy;Cryotherapy;Lobbyist;Therapeutic exercise;Therapeutic activities;Functional mobility  training;Stair training;Neuromuscular re-education;Patient/family education;Scar mobilization;Manual lymph drainage;Manual techniques;Energy conservation;Taping;Gait training;Moist Heat   Consulted and Agree with Plan of Care Patient      Patient will benefit from skilled therapeutic intervention in order to improve the following deficits and  impairments:  Pain, Improper body mechanics, Impaired sensation, Increased fascial restricitons, Increased muscle spasms, Postural dysfunction, Hypermobility, Decreased scar mobility, Decreased mobility, Decreased coordination, Decreased balance, Decreased range of motion, Decreased endurance, Decreased activity tolerance, Decreased strength, Hypomobility  Visit Diagnosis: Muscle weakness (generalized)  Lumbago with sciatica, right side  Unspecified lack of coordination     Problem List Patient Active Problem List   Diagnosis Date Noted  . Edema 01/24/2015  . Essential hypertension 01/24/2015  . Type 2 DM with diabetic neuropathy affecting both sides of body ([place]) 01/24/2015  . Restless legs syndrome 01/24/2015  . Chronic constipation 01/24/2015    Jerl Mina 06/07/2016, 11:32 PM  [place] MAIN [facility] SERVICES [address] [place], [place], [address] Phone: [phone]   Fax:  [phone]  Name: Cassandra [name] MRN: [id number] Date of Birth: 12-26-1926

## 2016-06-12 ENCOUNTER — Ambulatory Visit: Payer: Medicare Other | Admitting: Neurology

## 2016-06-14 ENCOUNTER — Ambulatory Visit: Payer: Medicare Other | Admitting: Physical Therapy

## 2016-06-14 DIAGNOSIS — R279 Unspecified lack of coordination: Secondary | ICD-10-CM

## 2016-06-14 DIAGNOSIS — M6281 Muscle weakness (generalized): Secondary | ICD-10-CM

## 2016-06-14 DIAGNOSIS — M5441 Lumbago with sciatica, right side: Secondary | ICD-10-CM

## 2016-06-14 NOTE — Patient Instructions (Addendum)
For overall cardiovascular, digestive health and building your core and back muscles.   Walking 20 min 3 x / week this week, progress gradually to 5 x/ week next week, and daily the following week. Keep daily walks for 2-3 weeks and then gradually build up to 30 min / daily next month.  Can split the 30 min walk into 3 sets of 10 min throughout the day (i.e. If the weather is cold/ wet outside, you can walk loops in the house for 10 min morning, after lunch, and after dinner.    Stretches/ ROM exercises  And then we will add more next week  Seated in chair:  Hands by hips, chest lifts, breathe and arms overhead and palms together (not arching back)  3x     6 directions of neck : 3 reps each side  Side to side Look over shoulder  (rotation) Chin tuck, chin up     6 directions of spine: 3 reps each side  Side to side Arm swings (rotation)  Mini squat (like sitting into chair) to forward fend           Stretches for your back (increase trunk rotation)  Open book ( 15 reps) (handout)

## 2016-06-15 NOTE — Therapy (Signed)
[place] MAIN [facility] SERVICES [address] [place], [place], [address] Phone: [phone]   Fax:  [phone]  Physical Therapy Treatment  Patient Details  Name: Cassandra [name] MRN: [id number] Date of Birth: [birth year] Referring Provider: Dr. Sharlet Salina   Encounter Date: 06/14/2016      PT End of Session - 06/15/16 2355    Visit Number 4   Number of Visits 12   Date for PT Re-Evaluation 07/27/16   Authorization Type g-code 4/10   PT Start Time 1010   PT Stop Time 1110   PT Time Calculation (min) 60 min   Activity Tolerance Patient tolerated treatment well;No increased pain   Behavior During Therapy WFL for tasks assessed/performed      Past Medical History:  Diagnosis Date  . Arthritis    osteoarthritis in hands, knees ( R torn cartilage, L meniscus removed)   . CAD (coronary artery disease)   . Diabetes mellitus without complication ([place])   . Glaucoma   . Hyperlipidemia   . Hypertension   . Hypothyroidism     Past Surgical History:  Procedure Laterality Date  . ABDOMINAL HYSTERECTOMY    . CORONARY ANGIOPLASTY WITH STENT PLACEMENT    . FOOT SURGERY Bilateral   . KNEE ARTHROSCOPY W/ MENISCECTOMY Left    Also has R knee injury (torn cartilage)     There were no vitals filed for this visit.      Subjective Assessment - 06/15/16 2353    Subjective Pt reported after last session, her back pain increased to 8/10  to the point that she took a half a pain pill and pain lasted for 2 days.  The back pain occurred along two muscles from the the ribcage along to hip and have been a pain that she had received Tx (heating pad and mm spasm pill) 6 months ago.  Back pain today is 5/10 .  The abdominal area is feeling better. The same day after last session, pt felt so relaxed in the abdominal area and finally had a regular bowel movement  after being constipated for 3 days and did not have to take [place].     Pertinent History DIGESTIVE  SYSTEM: 2010 pt underwent a colonscopy and her colon became perforated which led her to undergo surgery and wearing a colostomy for 4 months. GYNECOLOGY HX:  abdominal hysterectomy 2/2 fibroid tumors .  3 vaginal deliveries without injury. NEURO SYSTEM:  Neuropathy in BLE. Additional MSK: Arthritis in hands, B foot/ knee surgery,       Patient Stated Goals be able to workout in the pool, walk 20 min 2-3 x/ week with < 2/10 LBP             [facility] PT Assessment - 06/15/16 2353      AROM   Overall AROM Comments limited thoracic ext and sideflexion by 50%      Palpation   Spinal mobility increased parapsinal mm tensions B (L > R) , hypomobility of thoracic segments                     [facility] Adult PT Treatment/Exercise - 06/15/16 2353      Neuro Re-ed    Neuro Re-ed Details  see pt instructions     Manual Therapy   Manual therapy comments STM along paraspinals, Grade II-III along SP of thoracic/ lumbar segments in sidelying , MWM ith open book  PT Education - 06/15/16 2354    Education provided Yes   Education Details HEP   Person(s) Educated Patient   Methods Explanation;Demonstration;Tactile cues;Verbal cues;Handout   Comprehension Returned demonstration;Verbal cues required;Tactile cues required;Verbalized understanding             PT Long Term Goals - 06/07/16 1031      PT LONG TERM GOAL #1   Title Pt will be able to walk 20 min for 2-3 x week with < 2/10 LBP in order to lose weight and manage chronic diseases   Time 12   Period Weeks   Status Achieved     PT LONG TERM GOAL #2   Title Pt decrease frequency urination from 2x/ hr to 1x/ every 2 hrs in order improve QOL   Time 12   Period Weeks   Status Achieved     PT LONG TERM GOAL #3   Title Pt will decrease her ODI score from 18   % to  < 15 %  In order to walk up her stairs and lift groceries    Time 12   Period Weeks   Status New     PT LONG TERM GOAL #4   Title Pt  will increase bowel movements from every over day to daily with Stool Type 4 for 75% of the time.    Time 12   Period Weeks   Status New     PT LONG TERM GOAL #5   Title Pt will report improve sleep quality uninterrupted by pain across 1 week in order to and gai nphysiological benefits of sleep   Time 12   Period Weeks   Status New     PT LONG TERM GOAL #6   Title Pt will report decreased score on PFDI from 36% to < 29% in order to demo improved pelvic floor function    Time 12   Period Weeks   Status New               Plan - 06/15/16 2355    Clinical Impression Statement Pt reported her back pain decreased from 8/10 to 5/10 following Tx today. Pt showed decreased paraspinal mm and increased thoracic mobility following manual Tx and ther ex.   Pt benefitted from biopsychosocial apporaches and voiced understanding on how to minimize stress and mm tensions. PT will continue to benefit from skilled PT.   Rehab Potential Good   Clinical Impairments Affecting Rehab Potential co-morbidities, abdominal surgeries    PT Frequency 2x / week   PT Duration 12 weeks   PT Treatment/Interventions ADLs/Self Care Home Management;Aquatic Therapy;Cryotherapy;Lobbyist;Therapeutic exercise;Therapeutic activities;Functional mobility training;Stair training;Neuromuscular re-education;Patient/family education;Scar mobilization;Manual lymph drainage;Manual techniques;Energy conservation;Taping;Gait training;Moist Heat   Consulted and Agree with Plan of Care Patient      Patient will benefit from skilled therapeutic intervention in order to improve the following deficits and impairments:  Pain, Improper body mechanics, Impaired sensation, Increased fascial restricitons, Increased muscle spasms, Postural dysfunction, Hypermobility, Decreased scar mobility, Decreased mobility, Decreased coordination, Decreased balance, Decreased range of motion, Decreased endurance, Decreased  activity tolerance, Decreased strength, Hypomobility  Visit Diagnosis: Muscle weakness (generalized)  Lumbago with sciatica, right side  Unspecified lack of coordination     Problem List Patient Active Problem List   Diagnosis Date Noted  . Edema 01/24/2015  . Essential hypertension 01/24/2015  . Type 2 DM with diabetic neuropathy affecting both sides of body ([place]) 01/24/2015  . Restless legs syndrome 01/24/2015  . Chronic  constipation 01/24/2015    Jerl Mina ,PT, DPT, E-RYT  06/15/2016, 11:56 PM  [place] MAIN [facility] SERVICES [address] [place], [place], [address] Phone: [phone]   Fax:  [phone]  Name: Cassandra [name] MRN: [id number] Date of Birth: [birth year]

## 2016-06-29 ENCOUNTER — Ambulatory Visit: Payer: Medicare Other | Attending: Physical Medicine and Rehabilitation | Admitting: Physical Therapy

## 2016-06-29 DIAGNOSIS — M6281 Muscle weakness (generalized): Secondary | ICD-10-CM | POA: Diagnosis present

## 2016-06-29 DIAGNOSIS — R279 Unspecified lack of coordination: Secondary | ICD-10-CM | POA: Diagnosis present

## 2016-06-29 DIAGNOSIS — M5441 Lumbago with sciatica, right side: Secondary | ICD-10-CM | POA: Insufficient documentation

## 2016-06-29 DIAGNOSIS — G8929 Other chronic pain: Secondary | ICD-10-CM | POA: Diagnosis present

## 2016-06-30 NOTE — Therapy (Signed)
[place] MAIN [facility] SERVICES [address] [place], [place], [address] Phone: [phone]   Fax:  [phone]  Physical Therapy Treatment  Patient Details  Name: Cassandra [name] MRN: [id number] Date of Birth: 1926/12/06 Referring Provider: Dr. Sharlet Salina   Encounter Date: 06/29/2016      PT End of Session - 06/30/16 1657    Visit Number 5   Number of Visits 12   Date for PT Re-Evaluation 07/27/16   Authorization Type g-code 5/10   PT Start Time 0845   PT Stop Time 0930   PT Time Calculation (min) 45 min   Activity Tolerance Patient tolerated treatment well;No increased pain   Behavior During Therapy WFL for tasks assessed/performed      Past Medical History:  Diagnosis Date  . Arthritis    osteoarthritis in hands, knees ( R torn cartilage, L meniscus removed)   . CAD (coronary artery disease)   . Diabetes mellitus without complication ([place])   . Glaucoma   . Hyperlipidemia   . Hypertension   . Hypothyroidism     Past Surgical History:  Procedure Laterality Date  . ABDOMINAL HYSTERECTOMY    . CORONARY ANGIOPLASTY WITH STENT PLACEMENT    . FOOT SURGERY Bilateral   . KNEE ARTHROSCOPY W/ MENISCECTOMY Left    Also has R knee injury (torn cartilage)     There were no vitals filed for this visit.      Subjective Assessment - 06/30/16 1708    Subjective Pt reported she has been using the bathroom much better and eating and sleeping better. Pt discontinued deep core exericse due to back pain but she has enjoyed the ROM exercises. Her back pain today is 5/10 which is "really good".     Pertinent History DIGESTIVE SYSTEM: 2010 pt underwent a colonscopy and her colon became perforated which led her to undergo surgery and wearing a colostomy for 4 months. GYNECOLOGY HX:  abdominal hysterectomy 2/2 fibroid tumors .  3 vaginal deliveries without injury. NEURO SYSTEM:  Neuropathy in BLE. Additional MSK: Arthritis in hands, B foot/ knee  surgery,       Patient Stated Goals be able to workout in the pool, walk 20 min 2-3 x/ week with < 2/10 LBP                      Adult Aquatic Therapy - 06/30/16 1657      Aquatic Therapy Subjective   Subjective Pt reported no complaints       O: Pt entered/exited the pool via steps with single UE support on rail.  50 ft =1 lap  Exercises performed in 3'6" depth    Stretches/ ROM exercises: 3 reps each side   (in the beginning and end of session)  6 directions of neck and spine, Shoulder roll backwards Figure -4  Stretch B  Hip circles  Hip flexor stretch by step, hip flexor stretch with a twist with cues for proper alignment  Spinal traction from mini squat position with BUE on rail   Eagle pose 3 reps     2 laps walking forward and backward with end of noodles held to create elbow flexion/shoulder ER ~20-45 deg  Cued for more anterior ROM  2 laps walking backwards   2 laps [place]    Stretches/ ROM exercises: 3 reps each side   (in the beginning and end of session)  Figure -4  Stretch B  Hip circles  Hip flexor  stretch by step, hip flexor stretch with a twist with cues for proper alignment  Spinal traction from mini squat position with BUE on rail       Relaxation on noodles under head, armpits, midback, posterior thighs pulled by PT across 2 laps. Guided pt on body scan technique, on transitioning to stand with minimal strain on neck and back.                    PT Long Term Goals - 06/07/16 1031      PT LONG TERM GOAL #1   Title Pt will be able to walk 20 min for 2-3 x week with < 2/10 LBP in order to lose weight and manage chronic diseases   Time 12   Period Weeks   Status Achieved     PT LONG TERM GOAL #2   Title Pt decrease frequency urination from 2x/ hr to 1x/ every 2 hrs in order improve QOL   Time 12   Period Weeks   Status Achieved     PT LONG TERM GOAL #3   Title Pt will decrease her ODI score from 18   % to   < 15 %  In order to walk up her stairs and lift groceries    Time 12   Period Weeks   Status New     PT LONG TERM GOAL #4   Title Pt will increase bowel movements from every over day to daily with Stool Type 4 for 75% of the time.    Time 12   Period Weeks   Status New     PT LONG TERM GOAL #5   Title Pt will report improve sleep quality uninterrupted by pain across 1 week in order to and gai nphysiological benefits of sleep   Time 12   Period Weeks   Status New     PT LONG TERM GOAL #6   Title Pt will report decreased score on PFDI from 36% to < 29% in order to demo improved pelvic floor function    Time 12   Period Weeks   Status New               Plan - 06/30/16 1658    Clinical Impression Statement Pt had no complaints today with aquatic therapy. Pt required excessive tactile/ verbal cues for dissociation between trunk and pelvis. Pt showed IND with back stretches. Pt will continue to benefit skilled PT.    Rehab Potential Good   Clinical Impairments Affecting Rehab Potential co-morbidities, abdominal surgeries    PT Frequency 2x / week   PT Duration 12 weeks   PT Treatment/Interventions ADLs/Self Care Home Management;Aquatic Therapy;Cryotherapy;Lobbyist;Therapeutic exercise;Therapeutic activities;Functional mobility training;Stair training;Neuromuscular re-education;Patient/family education;Scar mobilization;Manual lymph drainage;Manual techniques;Energy conservation;Taping;Gait training;Moist Heat   Consulted and Agree with Plan of Care Patient      Patient will benefit from skilled therapeutic intervention in order to improve the following deficits and impairments:  Pain, Improper body mechanics, Impaired sensation, Increased fascial restricitons, Increased muscle spasms, Postural dysfunction, Hypermobility, Decreased scar mobility, Decreased mobility, Decreased coordination, Decreased balance, Decreased range of motion, Decreased  endurance, Decreased activity tolerance, Decreased strength, Hypomobility  Visit Diagnosis: Muscle weakness (generalized)  Chronic right-sided low back pain with right-sided sciatica  Unspecified lack of coordination     Problem List Patient Active Problem List   Diagnosis Date Noted  . Edema 01/24/2015  . Essential hypertension 01/24/2015  . Type 2 DM with diabetic neuropathy  affecting both sides of body ([place]) 01/24/2015  . Restless legs syndrome 01/24/2015  . Chronic constipation 01/24/2015    Jerl Mina ,PT, DPT, E-RYT  06/30/2016, 5:09 PM  [place] MAIN [facility] SERVICES [address] [place], [place], [address] Phone: [phone]   Fax:  [phone]  Name: Cassandra [name] MRN: [id number] Date of Birth: 01/31/1927

## 2016-07-03 ENCOUNTER — Encounter: Payer: Medicare Other | Admitting: Physical Therapy

## 2016-07-06 ENCOUNTER — Ambulatory Visit: Payer: Medicare Other | Admitting: Physical Therapy

## 2016-07-11 ENCOUNTER — Ambulatory Visit: Payer: Medicare Other | Admitting: Physical Therapy

## 2016-07-18 ENCOUNTER — Encounter: Payer: Medicare Other | Admitting: Physical Therapy

## 2016-07-18 ENCOUNTER — Ambulatory Visit: Payer: Medicare Other | Admitting: Physical Therapy

## 2016-07-18 DIAGNOSIS — M6281 Muscle weakness (generalized): Secondary | ICD-10-CM

## 2016-07-18 DIAGNOSIS — M5441 Lumbago with sciatica, right side: Secondary | ICD-10-CM

## 2016-07-18 DIAGNOSIS — G8929 Other chronic pain: Secondary | ICD-10-CM

## 2016-07-18 DIAGNOSIS — R279 Unspecified lack of coordination: Secondary | ICD-10-CM

## 2016-07-18 NOTE — Patient Instructions (Signed)
Body scan (emailed audio)  3 cycles each night or during the day to help decrease back tensions and decrease pain  ____________  Morning routine:  1) Open book laying on your side (Open to the R 20 reps),( L 15 reps)   Seated:  2) Chest lift. Shoulder blades lower and squeeze back with exhale  (less chin lift/ more of a midback bend)  10 reps   3) Shoulder squeezes with hands behind your hips by your side (fingertips points away from your buttock and push fingertips down )  5 sec count aloud, relax, repeat for 5 reps  ( 2 x day)

## 2016-07-18 NOTE — Therapy (Signed)
[place] MAIN [facility] SERVICES [address] [place], [place], [address] Phone: [phone]   Fax:  [phone]  Physical Therapy Treatment  Patient Details  Name: Cassandra [name] MRN: [id number] Date of Birth: 1926/12/31 Referring Provider: Dr. Sharlet Salina   Encounter Date: 07/18/2016      PT End of Session - 07/18/16 1511    Visit Number 6   Number of Visits 12   Date for PT Re-Evaluation 07/27/16   Authorization Type g-code 6/10   PT Start Time 1435   PT Stop Time 1530   PT Time Calculation (min) 55 min   Activity Tolerance Patient tolerated treatment well;No increased pain   Behavior During Therapy WFL for tasks assessed/performed      Past Medical History:  Diagnosis Date  . Arthritis    osteoarthritis in hands, knees ( R torn cartilage, L meniscus removed)   . CAD (coronary artery disease)   . Diabetes mellitus without complication ([place])   . Glaucoma   . Hyperlipidemia   . Hypertension   . Hypothyroidism     Past Surgical History:  Procedure Laterality Date  . ABDOMINAL HYSTERECTOMY    . CORONARY ANGIOPLASTY WITH STENT PLACEMENT    . FOOT SURGERY Bilateral   . KNEE ARTHROSCOPY W/ MENISCECTOMY Left    Also has R knee injury (torn cartilage)     There were no vitals filed for this visit.      Subjective Assessment - 07/18/16 1433    Subjective Pt reported she felt fine after last pool session 2 weeks ago. After a few hours leaving the pool session, pt experienced pain for 4 days which required her taking pain medication.  Pt recognized that she was not relaxed enough prior to the movement in the pool. 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 She does it off and on now.  Pt feels 5/10pain that is a dull aching pain, pt points to  L5/S and says the pain goes to the R like "someone hit me".  Pt is able to get up from the floor typical but she  asked her husband to help her today and he pulls on her and she noticed a sharp pain in her back muscles. That spot has been more tender since that time. Pt recognizes she is better with getting up by herself.     Pertinent History DIGESTIVE SYSTEM: 2010 pt underwent a colonscopy and her colon became perforated which led her to undergo surgery and wearing a colostomy for 4 months. GYNECOLOGY HX:  abdominal hysterectomy 2/2 fibroid tumors .  3 vaginal deliveries without injury. NEURO SYSTEM:  Neuropathy in BLE. Additional MSK: Arthritis in hands, B foot/ knee surgery,       Patient Stated Goals be able to workout in the pool, walk 20 min 2-3 x/ week with < 2/10 LBP             [facility] PT Assessment - 07/18/16 1443      AROM   Overall AROM Comments R sidebend 40 cm digit III to floor -- > post Tx 35 cm, L 40 cm--> 40 cm.  thoracic ext limited --> post Tx increased . R rtoation ~20 deg--> ~25 deg post Tx , L rotation ~30 deg      Palpation   Spinal mobility paraspinal mm tensins decreased with training  [place] Adult PT Treatment/Exercise - 07/18/16 1504      Therapeutic Activites    Therapeutic Activities --  guided body scan 3 cycles     Manual Therapy   Manual therapy comments light manual Tx at occiput, pelvis,                 PT Education - 07/18/16 1522    Education provided Yes   Education Details HEP   Person(s) Educated Patient   Methods Explanation;Demonstration;Tactile cues;Verbal cues;Handout   Comprehension Verbalized understanding;Returned demonstration             PT Long Term Goals - 07/18/16 1522      PT LONG TERM GOAL #1   Title Pt will be able to walk 20 min for 2-3 x week with < 2/10 LBP in order to lose weight and manage chronic diseases   Period Weeks   Status Achieved     PT LONG TERM GOAL #2   Title Pt decrease frequency urination from 2x/ hr to 1x/ every 2 hrs in order improve QOL   Time 12   Period Weeks    Status Achieved     PT LONG TERM GOAL #3   Title Pt will decrease her ODI score from 18   % to  < 15 %  In order to walk up her stairs and lift groceries    Time 12   Period Weeks   Status On-going     PT LONG TERM GOAL #4   Title Pt will increase bowel movements from every over day to daily with Stool Type 4 for 75% of the time.    Time 12   Period Weeks   Status Achieved     PT LONG TERM GOAL #5   Title Pt will report improve sleep quality uninterrupted by pain across 1 week in order to and gain physiological benefits of sleep   Time 12   Period Weeks   Status Achieved     Additional Long Term Goals   Additional Long Term Goals Yes     PT LONG TERM GOAL #6   Title Pt will report decreased score on PFDI from 36% to < 29% in order to demo improved pelvic floor function    Time 12   Period Weeks     PT LONG TERM GOAL #7   Title Pt will demo increased spinal ROM in order to decrease back pain (rotation to R ~30deg and side bend  R 45 cm  ) across 2 visits.    Time 12   Period Weeks   Status New               Plan - 07/18/16 1512    Clinical Impression Statement Pt achieved increased spinal mobility following mindfulness practice and light manual Tx. Pt will continue to benefit from biopsychosocial approaches.  Pt will continue to benefit from skilled PT to achieve her goals.   Rehab Potential Good   Clinical Impairments Affecting Rehab Potential co-morbidities, abdominal surgeries    PT Frequency 2x / week   PT Duration 12 weeks   PT Treatment/Interventions ADLs/Self Care Home Management;Aquatic Therapy;Cryotherapy;Lobbyist;Therapeutic exercise;Therapeutic activities;Functional mobility training;Stair training;Neuromuscular re-education;Patient/family education;Scar mobilization;Manual lymph drainage;Manual techniques;Energy conservation;Taping;Gait training;Moist Heat   Consulted and Agree with Plan of Care Patient      Patient  will benefit from skilled therapeutic intervention in order to improve the following deficits and impairments:  Pain, Improper body mechanics, Impaired sensation, Increased  fascial restricitons, Increased muscle spasms, Postural dysfunction, Hypermobility, Decreased scar mobility, Decreased mobility, Decreased coordination, Decreased balance, Decreased range of motion, Decreased endurance, Decreased activity tolerance, Decreased strength, Hypomobility  Visit Diagnosis: Muscle weakness (generalized)  Chronic right-sided low back pain with right-sided sciatica  Unspecified lack of coordination     Problem List Patient Active Problem List   Diagnosis Date Noted  . Edema 01/24/2015  . Essential hypertension 01/24/2015  . Type 2 DM with diabetic neuropathy affecting both sides of body ([place]) 01/24/2015  . Restless legs syndrome 01/24/2015  . Chronic constipation 01/24/2015    Jerl Mina ,PT, DPT, E-RYT  07/18/2016, 3:32 PM  [place] MAIN [facility] SERVICES [address] [place], [place], [address] Phone: [phone]   Fax:  [phone]  Name: Cassandra [name] MRN: [id number] Date of Birth: [birth year]

## 2016-07-28 ENCOUNTER — Ambulatory Visit: Payer: Medicare Other | Attending: Physical Medicine and Rehabilitation | Admitting: Physical Therapy

## 2016-07-28 DIAGNOSIS — R279 Unspecified lack of coordination: Secondary | ICD-10-CM | POA: Insufficient documentation

## 2016-07-28 DIAGNOSIS — M5441 Lumbago with sciatica, right side: Secondary | ICD-10-CM | POA: Diagnosis present

## 2016-07-28 DIAGNOSIS — M6281 Muscle weakness (generalized): Secondary | ICD-10-CM | POA: Diagnosis present

## 2016-07-28 DIAGNOSIS — G8929 Other chronic pain: Secondary | ICD-10-CM | POA: Insufficient documentation

## 2016-07-28 NOTE — Therapy (Addendum)
[place] MAIN [facility] SERVICES [address] [place], [place], [address] Phone: [phone]   Fax:  [phone]  Physical Therapy Treatment  Patient Details  Name: Cassandra [name] MRN: [id number] Date of Birth: 1927/06/30 Referring Provider: Dr. Sharlet Salina   Encounter Date: 07/28/2016      PT End of Session - 07/28/16 1459    Visit Number 7   Number of Visits 12   Date for PT Re-Evaluation 07/27/16   Authorization Type g-code 7/10   PT Start Time 1410   PT Stop Time 1500   PT Time Calculation (min) 50 min   Activity Tolerance Patient tolerated treatment well;No increased pain   Behavior During Therapy WFL for tasks assessed/performed      Past Medical History:  Diagnosis Date  . Arthritis    osteoarthritis in hands, knees ( R torn cartilage, L meniscus removed)   . CAD (coronary artery disease)   . Diabetes mellitus without complication ([place])   . Glaucoma   . Hyperlipidemia   . Hypertension   . Hypothyroidism     Past Surgical History:  Procedure Laterality Date  . ABDOMINAL HYSTERECTOMY    . CORONARY ANGIOPLASTY WITH STENT PLACEMENT    . FOOT SURGERY Bilateral   . KNEE ARTHROSCOPY W/ MENISCECTOMY Left    Also has R knee injury (torn cartilage)     There were no vitals filed for this visit.      Subjective Assessment - 07/28/16 1416    Subjective Pt reported last session has decreased her back pain to a 4/10 from 5-6/10. Pt her remaining complaint of a tugging feeling on her R hip with R sidebend.    Pertinent History DIGESTIVE SYSTEM: 2010 pt underwent a colonscopy and her colon became perforated which led her to undergo surgery and wearing a colostomy for 4 months. GYNECOLOGY HX:  abdominal hysterectomy 2/2 fibroid tumors .  3 vaginal deliveries without injury. NEURO SYSTEM:  Neuropathy in BLE. Additional MSK: Arthritis in hands, B foot/ knee surgery,       Patient Stated Goals be able to workout in the pool, walk 20 min  2-3 x/ week with < 2/10 LBP             [facility] PT Assessment - 07/28/16 1453      Coordination   Gross Motor Movements are Fluid and Coordinated --  no pain with deep core level 2     Palpation   Spinal mobility signficantly decreased parapsinal mm tensions    Palpation comment moderately restricted scar restrictions over abdomen                      [facility] Adult PT Treatment/Exercise - 07/28/16 1455      Therapeutic Activites    Therapeutic Activities --  see pt instructions with neuro-reedu with breathing     Manual Therapy   Manual therapy comments scar release                 PT Education - 07/28/16 1459    Education provided Yes   Education Details HEP   Person(s) Educated Patient   Methods Explanation;Demonstration;Tactile cues;Verbal cues;Handout   Comprehension Verbalized understanding;Returned demonstration;Verbal cues required;Tactile cues required             PT Long Term Goals - 07/18/16 1522      PT LONG TERM GOAL #1   Title Pt will be able to walk 20 min for 2-3 x  week with < 2/10 LBP in order to lose weight and manage chronic diseases   Period Weeks   Status Achieved     PT LONG TERM GOAL #2   Title Pt decrease frequency urination from 2x/ hr to 1x/ every 2 hrs in order improve QOL   Time 12   Period Weeks   Status Achieved     PT LONG TERM GOAL #3   Title Pt will decrease her ODI score from 18   % to  < 15 %  In order to walk up her stairs and lift groceries    Time 12   Period Weeks   Status On-going     PT LONG TERM GOAL #4   Title Pt will increase bowel movements from every over day to daily with Stool Type 4 for 75% of the time.    Time 12   Period Weeks   Status Achieved     PT LONG TERM GOAL #5   Title Pt will report improve sleep quality uninterrupted by pain across 1 week in order to and gain physiological benefits of sleep   Time 12   Period Weeks   Status Achieved     Additional Long Term Goals    Additional Long Term Goals Yes     PT LONG TERM GOAL #6   Title Pt will report decreased score on PFDI from 36% to < 29% in order to demo improved pelvic floor function    Time 12   Period Weeks     PT LONG TERM GOAL #7   Title Pt will demo increased spinal ROM in order to decrease back pain (rotation to R ~30deg and side bend  R 45 cm  ) across 2 visits.    Time 12   Period Weeks   Status New               Plan - 07/28/16 1500    Clinical Impression Statement Pt showed good carry from last session with signficantly decreased back mm tensions, more upright posture, and reported of no back issues with HEP. Pt has returned to being able to perform deep core level 2 exercise with understanding and no pain. Pt also progressed to resistance training to strengthen thoracolumbar mm system. Pt continues to require scar massage to address her complaint of tightness at R hip with R sidebend, Pt is progressing well towards her goals.       Patient will benefit from skilled therapeutic intervention in order to improve the following deficits and impairments:     Visit Diagnosis: Muscle weakness (generalized)  Chronic right-sided low back pain with right-sided sciatica  Unspecified lack of coordination     Problem List Patient Active Problem List   Diagnosis Date Noted  . Edema 01/24/2015  . Essential hypertension 01/24/2015  . Type 2 DM with diabetic neuropathy affecting both sides of body ([place]) 01/24/2015  . Restless legs syndrome 01/24/2015  . Chronic constipation 01/24/2015    Jerl Mina ,PT, DPT, E-RYT  07/28/2016, 3:18 PM  [place] MAIN [facility] SERVICES [address] [place], [place], [address] Phone: [phone]   Fax:  [phone]  Name: Cassandra [name] MRN: [id number] Date of Birth: [birth year]

## 2016-07-28 NOTE — Patient Instructions (Addendum)
Deep core level 2  Exhale move the knee out 10 -15 deg  Exercise   10 reps   _______  yellow band under heels while laying on back w/ knees bent  "A to W" exercise  10 reps x 1 set Hold band with thumbs point out - inhale and then exhale pull bands by bending elbows hands move in a "w"  (feel shoulder blades squeezing)    ______  Deep core level 2  Exhale move the knee out 10 -15 deg  Exercise   10 reps   _______  yellow band under heels while laying on back w/ knees bent  "A to W" exercise  10 reps x 1 set Hold band with thumbs point out - inhale and then exhale pull bands by bending elbows hands move in a "w"  (feel shoulder blades squeezing)  ________   Deep core level 2  Exhale move the knee out 10 -15 deg  Exercise   10 reps   ______  Continue with scar massage and flexibility exercises and body scan

## 2016-08-01 ENCOUNTER — Encounter: Payer: Medicare Other | Admitting: Physical Therapy

## 2016-08-11 ENCOUNTER — Ambulatory Visit: Payer: Medicare Other | Admitting: Physical Therapy

## 2016-08-11 DIAGNOSIS — M5441 Lumbago with sciatica, right side: Secondary | ICD-10-CM

## 2016-08-11 DIAGNOSIS — M6281 Muscle weakness (generalized): Secondary | ICD-10-CM | POA: Diagnosis not present

## 2016-08-11 DIAGNOSIS — R279 Unspecified lack of coordination: Secondary | ICD-10-CM

## 2016-08-11 DIAGNOSIS — G8929 Other chronic pain: Secondary | ICD-10-CM

## 2016-08-11 NOTE — Therapy (Signed)
[place] MAIN [facility] SERVICES [address] [place], [place], [address] Phone: [phone]   Fax:  [phone]  Physical Therapy Treatment  Patient Details  Name: Cassandra [name] MRN: [id number] Date of Birth: 09/20/1927 Referring Provider: Dr. Sharlet Salina   Encounter Date: 08/11/2016    Past Medical History:  Diagnosis Date  . Arthritis    osteoarthritis in hands, knees ( R torn cartilage, L meniscus removed)   . CAD (coronary artery disease)   . Diabetes mellitus without complication ([place])   . Glaucoma   . Hyperlipidemia   . Hypertension   . Hypothyroidism     Past Surgical History:  Procedure Laterality Date  . ABDOMINAL HYSTERECTOMY    . CORONARY ANGIOPLASTY WITH STENT PLACEMENT    . FOOT SURGERY Bilateral   . KNEE ARTHROSCOPY W/ MENISCECTOMY Left    Also has R knee injury (torn cartilage)     There were no vitals filed for this visit.      Subjective Assessment - 08/11/16 1503    Subjective Pt reported she has found her exercises to be helpful. Pt lifted a bottle of detergent the wrong way the other day and corrected herself and cradled it close to her instead of holding it by her side. Pt feels her back is tight today and she feels very stressed and concerned about her husband's memory and behavior issues.    Pertinent History DIGESTIVE SYSTEM: 2010 pt underwent a colonscopy and her colon became perforated which led her to undergo surgery and wearing a colostomy for 4 months. GYNECOLOGY HX:  abdominal hysterectomy 2/2 fibroid tumors .  3 vaginal deliveries without injury. NEURO SYSTEM:  Neuropathy in BLE. Additional MSK: Arthritis in hands, B foot/ knee surgery,       Patient Stated Goals be able to workout in the pool, walk 20 min 2-3 x/ week with < 2/10 LBP             [facility] PT Assessment - 08/11/16 1505      Observation/Other Assessments   Observations decreased mm tensions, less forward head.      Ambulation/Gait   Gait Comments heel striking with posterior COM                      [facility] Adult PT Treatment/Exercise - 08/11/16 1505      Therapeutic Activites    Therapeutic Activities --  pt instructions,resources for dementia/ caregiver support                PT Education - 08/11/16 1503    Education provided Yes   Education Details HEP, active listening to pt's stressors, progressed exercises, gait training, provided resources for dementia/ caregiver support as pt was not sure where to get help for her husband's behavior and memory changes    Person(s) Educated Patient   Methods Explanation;Demonstration;Tactile cues;Verbal cues;Handout   Comprehension Returned demonstration;Verbalized understanding             PT Long Term Goals - 08/11/16 1439      PT LONG TERM GOAL #1   Title Pt will be able to walk 20 min for 2-3 x week with < 2/10 LBP in order to lose weight and manage chronic diseases   Period Weeks   Status Achieved     PT LONG TERM GOAL #2   Title Pt decrease frequency urination from 2x/ hr to 1x/ every 2 hrs in order improve QOL   Time 12  Period Weeks   Status Achieved     PT LONG TERM GOAL #3   Title Pt will decrease her ODI score from 18   % to  < 15 %  In order to walk up her stairs and lift groceries    Time 12   Period Weeks   Status On-going     PT LONG TERM GOAL #4   Title Pt will increase bowel movements from every over day to daily with Stool Type 4 for 75% of the time.    Time 12   Period Weeks   Status Achieved     PT LONG TERM GOAL #5   Title Pt will report improve sleep quality uninterrupted by pain across 1 week in order to and gain physiological benefits of sleep   Time 12   Period Weeks   Status Achieved     PT LONG TERM GOAL #6   Title Pt will report decreased score on PFDI from 36% to < 29% in order to demo improved pelvic floor function    Time 12   Period Weeks     PT LONG TERM GOAL #7   Title Pt will demo  increased spinal ROM in order to decrease back pain (rotation to R ~30deg and side bend  R 45 cm  ) across 2 visits.    Time 12   Period Weeks   Status New               Plan - 08/11/16 1509    Clinical Impression Statement Pt reports she has been managing her LBP with her exercises which she really enjoys performing. Pt reported her back felt less tight after performing the traction exercise with a yoga belt today. Progressed pt to seated maraching as a functional core exercise which she demo'd correctly. Pt also was provided gait training to decrease posterior COM and heel striking. Pt demo'd correctly and voiced understanding on impact to low back mechanics. PT provided pt with active listening  to pt's stressors at home and provided resources for dementia/ caregiver support as pt was not sure how to get help for her husband's behavior and memory changes. Pt will continue to benefit from skilled PT to advance her strengthening exercises to achieve her goals.      Rehab Potential Good   Clinical Impairments Affecting Rehab Potential co-morbidities, abdominal surgeries    PT Frequency 2x / week   PT Duration 12 weeks   PT Treatment/Interventions ADLs/Self Care Home Management;Aquatic Therapy;Cryotherapy;Lobbyist;Therapeutic exercise;Therapeutic activities;Functional mobility training;Stair training;Neuromuscular re-education;Patient/family education;Scar mobilization;Manual lymph drainage;Manual techniques;Energy conservation;Taping;Gait training;Moist Heat   Consulted and Agree with Plan of Care Patient      Patient will benefit from skilled therapeutic intervention in order to improve the following deficits and impairments:  Pain, Improper body mechanics, Impaired sensation, Increased fascial restricitons, Increased muscle spasms, Postural dysfunction, Hypermobility, Decreased scar mobility, Decreased mobility, Decreased coordination, Decreased balance,  Decreased range of motion, Decreased endurance, Decreased activity tolerance, Decreased strength, Hypomobility  Visit Diagnosis: Muscle weakness (generalized)  Chronic right-sided low back pain with right-sided sciatica  Unspecified lack of coordination     Problem List Patient Active Problem List   Diagnosis Date Noted  . Edema 01/24/2015  . Essential hypertension 01/24/2015  . Type 2 DM with diabetic neuropathy affecting both sides of body ([place]) 01/24/2015  . Restless legs syndrome 01/24/2015  . Chronic constipation 01/24/2015    Jerl Mina ,PT, DPT, E-RYT  08/11/2016, 3:16 PM  [place] MAIN [facility] SERVICES [address] [place], [place], [address] Phone: [phone]   Fax:  [phone]  Name: Cassandra [name] MRN: [id number] Date of Birth: Mar 01, 1927

## 2016-08-11 NOTE — Patient Instructions (Addendum)
Seated marching with exhalation to engage belly muscles 41minute intervals and rest x 3    Forward bending to lengthen your spine with a yoga strap at the hips hooked to the door  With chair in front for hands  5 breaths     Gait training: walking more weight on the front half of your feet and less on heels to decrease back pain. Notice your exhale for abdominal engagement

## 2016-08-15 ENCOUNTER — Encounter: Payer: Medicare Other | Admitting: Physical Therapy

## 2016-08-21 ENCOUNTER — Ambulatory Visit: Payer: Medicare Other | Admitting: Physical Therapy

## 2016-08-29 ENCOUNTER — Encounter: Payer: Medicare Other | Admitting: Physical Therapy

## 2016-12-11 ENCOUNTER — Ambulatory Visit: Payer: Medicare Other | Attending: Physical Medicine and Rehabilitation | Admitting: Physical Therapy

## 2016-12-11 DIAGNOSIS — M6281 Muscle weakness (generalized): Secondary | ICD-10-CM | POA: Diagnosis present

## 2016-12-11 DIAGNOSIS — R278 Other lack of coordination: Secondary | ICD-10-CM

## 2016-12-11 NOTE — Patient Instructions (Addendum)
PELVIC FLOOR / KEGEL EXERCISES   Pelvic floor/ Kegel exercises are used to strengthen the muscles in the base of your pelvis that are responsible for supporting your pelvic organs and preventing urine/feces leakage. Based on your therapist's recommendations, they can be performed while standing, sitting, or lying down. Imagine pelvic floor area as a diamond with pelvic landmarks: top =pubic bone, bottom tip=tailbone, sides=sitting bones (ischial tuberosities).    Make yourself aware of this muscle group by using these cues while coordinating your breath:  Inhale, feel pelvic floor diamond area lower like hammock towards your feet and ribcage/belly expanding. Pause. Let the exhale naturally and feel your belly sink, abdominal muscles hugging in around you and you may notice the pelvic diamond draws upward towards your head forming a umbrella shape. Give a squeeze during the exhalation like you are stopping the flow of urine. If you are squeezing the buttock muscles, try to give 50% less effort.   Common Errors:  Breath holding: If you are holding your breath, you may be bearing down against your bladder instead of pulling it up. If you belly bulges up while you are squeezing, you are holding your breath. Be sure to breathe gently in and out while exercising. Counting out loud may help you avoid holding your breath.  Accessory muscle use: You should not see or feel other muscle movement when performing pelvic floor exercises. When done properly, no one can tell that you are performing the exercises. Keep the buttocks, belly and inner thighs relaxed.  Overdoing it: Your muscles can fatigue and stop working for you if you over-exercise. You may actually leak more or feel soreness at the lower abdomen or rectum.  YOUR HOME EXERCISE PROGRAM     Quick  squeezes: Position: on  sitting   Inhale and then exhale. Then squeeze the muscle.  (Be sure to let belly sink in with exhales and not push  outward)  Perform 5 repetitions, 3   Times/day                     DECREASE DOWNWARD PRESSURE ON  YOUR PELVIC FLOOR, ABDOMINAL, LOW BACK MUSCLES       PRESERVE YOUR PELVIC HEALTH LONG-TERM   ** SQUEEZE pelvic floor BEFORE YOUR SNEEZE, COUGH, LAUGH         ** LOG ROLL OUT OF BED INSTEAD OF CRUNCH/SIT-UP       **  Breathe with bowel movements and not strain with abdominal muscles   _________      Log rolling out of .bed   L arm overhead  Raise hips and scoot hips to R   Drop knees to L,  scooting L shoulder back to get completely on your L side so your shoulders, hips, and knees point to the L    Then breathe as you drop feet off bed and prop onto L elbow and  use both hands to push yourself

## 2016-12-13 NOTE — Therapy (Addendum)
[place] MAIN [facility] SERVICES [address] [place], [place], [address] Phone: [phone]   Fax:  [phone]  Physical Therapy Evaluation  Patient Details  Name: Cassandra [name] MRN: [id number] Date of Birth: Sep 22, 1927 Referring Provider: Dr. Sharlet Salina   Encounter Date: 12/11/2016      PT End of Session - 12/25/16 0936    Visit Number 1   Number of Visits 12   Date for PT Re-Evaluation 03/05/17   Authorization Type g-code 1/10   PT Start Time 1005   PT Stop Time 1100   PT Time Calculation (min) 55 min   Activity Tolerance Patient tolerated treatment well;No increased pain   Behavior During Therapy WFL for tasks assessed/performed      Past Medical History:  Diagnosis Date  . Arthritis    osteoarthritis in hands, knees ( R torn cartilage, L meniscus removed)   . CAD (coronary artery disease)   . Diabetes mellitus without complication ([place])   . Glaucoma   . Hyperlipidemia   . Hypertension   . Hypothyroidism     Past Surgical History:  Procedure Laterality Date  . ABDOMINAL HYSTERECTOMY    . CORONARY ANGIOPLASTY WITH STENT PLACEMENT    . FOOT SURGERY Bilateral   . KNEE ARTHROSCOPY W/ MENISCECTOMY Left    Also has R knee injury (torn cartilage)     There were no vitals filed for this visit.       Subjective Assessment - 12/25/16 0936    Subjective Pt reports urinary incontinence across the past 2-3 years. Leakage occurs with coughing, sneezing, laughing, having leakage before making it to the toilet, and not being able to delay urination when she feels the urge. Nocturia episodes occur 3-4x/ night. Pt reports poor sleep and has not had a sleep study.   Pt also reports constipation with Bristol Stool Scale Type 1 (50% of the time) and 4 (other 50% of the time).  Bowel frequency occurs 2-3 days.    Pertinent History DIGESTIVE SYSTEM: 2010 pt underwent a colonscopy and her colon became perforated which led her to undergo surgery  and wearing a colostomy for 4 months. GYNECOLOGY HX:  abdominal hysterectomy 2/2 fibroid tumors .  3 vaginal deliveries without injury. NEURO SYSTEM:  Neuropathy in BLE. Additional MSK: Arthritis in hands, B foot/ knee surgery, recent rib injury that interrupts her sleep   Patient Stated Goals be able to workout in the pool, walk 20 min 2-3 x/ week with < 2/10 LBP             [facility] PT Assessment - 12/25/16 0933      Coordination   Gross Motor Movements are Fluid and Coordinated --  abdominal straining with cue for pelvic floor contraction   Fine Motor Movements are Fluid and Coordinated --  quick contractions achieved with cuing, 3 reps before fatigue     Palpation   Palpation comment moderately restricted scar restrictions over abdomen                    [facility] Adult PT Treatment/Exercise - 12/25/16 0933      Therapeutic Activites    Therapeutic Activities --  see pt instructions                     PT Long Term Goals - 12/22/16 1259      PT LONG TERM GOAL #1   Title Pt will decrease her PFDI score from 48% to <  43% in order to demo improved pelvic floor function   Time 12   Period Weeks   Status New     PT LONG TERM GOAL #2   Title Pt will demo improved stool consistency from having Type 1 (50% OF THE TIME) TO less than < 25% of the time in order to improve bowels and minimize strain on the pelvic floor mm.     Time 12   Period Weeks   Status New     PT LONG TERM GOAL #3   Title Pt will report having no leakage before making it to the toilet > 50% of the time in order to participate in community events   Time 12   Period Weeks   Status New     PT LONG TERM GOAL #4   Title Pt will decrease her ODI score from 18% to < 13% in order to demo improved LBP   Time 12   Period Weeks   Status New               Plan - 12/25/16 0944    Clinical Impression Statement Pt is 83 yo female who reports pelvic floor dysfunctions that include mixed  urinary incontinence, poor bowel stool consistency, constipation in addition to CLBP. Pt's CLBP has improved significantly after completing PT last year. She would like to focus primarily on her pelvic floor issues. Pt's clinical  presentations include dyscoordination of her pelvic floor and deep core mm, poor toileting mechanics, scar restrictions over abdomen (but signifciantly decreased compared to last POC from last year ) , and low pelvic floor endurance. Following Tx, pt demo'd proper pelvic floor quick contractions with correct coordination with breathing. Pt has a recent rib injury that may be a barrier to prognosis. Pt also may benefit from a sleep study to see if managed OSA may also improve her nocturia.    Rehab Potential Good   Clinical Impairments Affecting Rehab Potential co-morbidities, abdominal surgeries    PT Frequency 1x / week   PT Duration 12 weeks   PT Treatment/Interventions ADLs/Self Care Home Management;Aquatic Therapy;Cryotherapy;Lobbyist;Therapeutic exercise;Therapeutic activities;Functional mobility training;Stair training;Neuromuscular re-education;Patient/family education;Scar mobilization;Manual lymph drainage;Manual techniques;Energy conservation;Taping;Gait training;Moist Heat   Consulted and Agree with Plan of Care Patient      Patient will benefit from skilled therapeutic intervention in order to improve the following deficits and impairments:  Pain, Improper body mechanics, Impaired sensation, Increased fascial restricitons, Increased muscle spasms, Postural dysfunction, Hypermobility, Decreased scar mobility, Decreased mobility, Decreased coordination, Decreased balance, Decreased range of motion, Decreased endurance, Decreased activity tolerance, Decreased strength, Hypomobility  Visit Diagnosis: Muscle weakness (generalized)  Other lack of coordination     Problem List Patient Active Problem List   Diagnosis Date Noted  .  Edema 01/24/2015  . Essential hypertension 01/24/2015  . Type 2 DM with diabetic neuropathy affecting both sides of body ([place]) 01/24/2015  . Restless legs syndrome 01/24/2015  . Chronic constipation 01/24/2015    Jerl Mina ,PT, DPT, E-RYT  12/25/2016, 9:45 AM  [place] MAIN [facility] SERVICES [address] [place], [place], [address] Phone: [phone]   Fax:  [phone]  Name: Jakki Doughty MRN: [id number] Date of Birth: 1927/01/15

## 2016-12-20 ENCOUNTER — Ambulatory Visit: Payer: Medicare Other | Admitting: Physical Therapy

## 2016-12-25 ENCOUNTER — Ambulatory Visit: Payer: Medicare Other | Attending: Family Medicine | Admitting: Physical Therapy

## 2016-12-25 NOTE — Addendum Note (Signed)
Addended by: Jerl Mina on: 12/25/2016 09:46 AM   Modules accepted: Orders

## 2017-01-01 ENCOUNTER — Encounter: Payer: Medicare Other | Admitting: Physical Therapy

## 2017-01-03 ENCOUNTER — Ambulatory Visit: Payer: Medicare Other | Admitting: Physical Therapy

## 2017-01-03 ENCOUNTER — Telehealth: Payer: Self-pay | Admitting: Physical Therapy

## 2017-01-03 NOTE — Telephone Encounter (Signed)
PT called pt about 2 missed appt. Pt stated she has been told by her MD to withhold PT due to her rib injury. PT and pt agreed to cancel the rest of her appts and to schedule one for late June.

## 2017-01-15 ENCOUNTER — Encounter: Payer: Medicare Other | Admitting: Physical Therapy

## 2017-01-29 ENCOUNTER — Encounter: Payer: Medicare Other | Admitting: Physical Therapy

## 2017-02-12 ENCOUNTER — Encounter: Payer: Medicare Other | Admitting: Physical Therapy

## 2017-03-09 ENCOUNTER — Ambulatory Visit: Payer: Medicare Other | Admitting: Physical Therapy

## 2017-04-13 ENCOUNTER — Ambulatory Visit: Payer: Medicare Other | Attending: Family Medicine | Admitting: Physical Therapy

## 2017-04-13 DIAGNOSIS — M6281 Muscle weakness (generalized): Secondary | ICD-10-CM

## 2017-04-13 DIAGNOSIS — R278 Other lack of coordination: Secondary | ICD-10-CM

## 2017-04-13 DIAGNOSIS — R279 Unspecified lack of coordination: Secondary | ICD-10-CM

## 2017-04-13 DIAGNOSIS — G8929 Other chronic pain: Secondary | ICD-10-CM

## 2017-04-13 DIAGNOSIS — M5441 Lumbago with sciatica, right side: Secondary | ICD-10-CM | POA: Insufficient documentation

## 2017-04-13 DIAGNOSIS — M791 Myalgia: Secondary | ICD-10-CM | POA: Insufficient documentation

## 2017-04-13 NOTE — Therapy (Signed)
[place] MAIN [facility] SERVICES [address] [place], [place], [address] Phone: [phone]   Fax:  [phone]  Patient Details  Name: Cassandra [name] MRN: [id number] Date of Birth: 27-Jun-1927 Referring Provider:  Sharlet Salina, MD  Encounter Date: 04/13/2017    Discharge Note   Pt returns to PT from her last visit 3 months ago to explain her current medical conditions. PT Tx had been withheld for this period due to her rib injury and bronchitis. Today, pt reports she is undergoing medical work up for protein in her kidneys.    Pt states she continues to her exercises that are comfortable to her to do for her back but  would like to continue PT at a later time.   Pt is being discharged at this time.    Jerl Mina ,PT, DPT, E-RYT  04/13/2017, 11:13 AM  [place] [facility] MAIN [facility] SERVICES [address] [place], [place], [address] Phone: [phone]   Fax:  [phone]

## 2017-05-16 ENCOUNTER — Encounter: Payer: Medicare Other | Admitting: Physical Therapy

## 2017-08-01 ENCOUNTER — Encounter (INDEPENDENT_AMBULATORY_CARE_PROVIDER_SITE_OTHER): Payer: Self-pay | Admitting: Vascular Surgery

## 2017-08-01 ENCOUNTER — Ambulatory Visit (INDEPENDENT_AMBULATORY_CARE_PROVIDER_SITE_OTHER): Payer: Medicare Other | Admitting: Vascular Surgery

## 2017-08-01 ENCOUNTER — Other Ambulatory Visit (INDEPENDENT_AMBULATORY_CARE_PROVIDER_SITE_OTHER): Payer: Medicare Other

## 2017-08-01 ENCOUNTER — Other Ambulatory Visit (INDEPENDENT_AMBULATORY_CARE_PROVIDER_SITE_OTHER): Payer: Self-pay | Admitting: Vascular Surgery

## 2017-08-01 VITALS — BP 156/76 | HR 66 | Resp 16 | Ht 60.0 in | Wt 171.0 lb

## 2017-08-01 DIAGNOSIS — R600 Localized edema: Secondary | ICD-10-CM | POA: Diagnosis not present

## 2017-08-01 DIAGNOSIS — M79605 Pain in left leg: Secondary | ICD-10-CM | POA: Diagnosis not present

## 2017-08-01 DIAGNOSIS — M7989 Other specified soft tissue disorders: Secondary | ICD-10-CM

## 2017-08-01 DIAGNOSIS — M79604 Pain in right leg: Secondary | ICD-10-CM | POA: Diagnosis not present

## 2017-08-01 NOTE — Progress Notes (Signed)
Subjective:    Patient ID: Cassandra [name], female    DOB: Mar 23, 1927, 83 y.o.   MRN: [id number] Chief Complaint  Patient presents with  . New Patient (Initial Visit)    Bilateral leg pain, worse on the left    Patient presents self-referred or bilateral lower extremity pain and swelling. The patient reports a long-standing history of pain which starts at night when she is "relaxing".The patient experiences cramping to the bilateral lower extremities while she is laying in bed. The patient states her cramping has progressively worsened prompting her to seek medical attention. The patient states that this does not always happen with activity. The patient denies any claudication-like symptoms, rest pain or ulceration to the lower extremity. The patient denies any trauma or surgery to the lower extremity. The patient denies history of DVT. The patient's discomfort has progressive the point she is unable to function on a daily basis. The patient's symptoms are lifestyle limiting. At this time, the patient has not started engaging in conservative therapy. The patient denies any fever, nausea or vomiting. The patient underwent a bilateral lower extremity venous reflux exam this was notable for no evidence of deep vein thrombosis of the bilateral lower extremity.No evidence of superficial thrombophlebitis of the bilateral lower extremity. Incompetence of the bilateral great saphenous veins. Incompetence of the right small saphenous vein.   Review of Systems  Constitutional: Negative.   HENT: Negative.   Eyes: Negative.   Respiratory: Negative.   Cardiovascular:       Lower extremity pain  Gastrointestinal: Negative.   Endocrine: Negative.   Genitourinary: Negative.   Musculoskeletal: Negative.   Skin: Negative.   Allergic/Immunologic: Negative.   Neurological: Negative.   Hematological: Negative.   Psychiatric/Behavioral: Negative.       Objective:   Physical Exam  Constitutional: She is  oriented to person, place, and time. She appears well-developed and well-nourished.  HENT:  Head: Normocephalic and atraumatic.  Eyes: Conjunctivae are normal. Pupils are equal, round, and reactive to light.  Neck: Normal range of motion.  Cardiovascular: Normal rate, regular rhythm, normal heart sounds and intact distal pulses.  Pulses:      Radial pulses are 2+ on the right side, and 2+ on the left side.  Unable to palpate pedal pulses however the foot is warm  Pulmonary/Chest: Effort normal and breath sounds normal.  Musculoskeletal: Normal range of motion. She exhibits no edema.  Neurological: She is alert and oriented to person, place, and time.  Skin:  There is no cellulitis. There is no stasis dermatitis. There is no skin changes. Skin is intact  Psychiatric: She has a normal mood and affect. Her behavior is normal. Judgment and thought content normal.  Vitals reviewed.  BP (!) 156/76 (BP Location: Right Arm, Patient Position: Sitting)   Pulse 66   Resp 16   Ht 5' (1.524 m)   Wt 171 lb (77.6 kg)   BMI 33.40 kg/m   Past Medical History:  Diagnosis Date  . Arthritis    osteoarthritis in hands, knees ( R torn cartilage, L meniscus removed)   . CAD (coronary artery disease)   . Diabetes mellitus without complication ([place])   . Glaucoma   . Hyperlipidemia   . Hypertension   . Hypothyroidism    Social History   Socioeconomic History  . Marital status: Married    Spouse name: Not on file  . Number of children: Not on file  . Years of education: Not on file  .  Highest education level: Not on file  Social Needs  . Financial resource strain: Not on file  . Food insecurity - worry: Not on file  . Food insecurity - inability: Not on file  . Transportation needs - medical: Not on file  . Transportation needs - non-medical: Not on file  Occupational History  . Not on file  Tobacco Use  . Smoking status: Former Smoker    Last attempt to quit: 01/21/1980    Years since  quitting: 37.5  . Smokeless tobacco: Never Used  Substance and Sexual Activity  . Alcohol use: Yes    Alcohol/week: 0.0 oz    Comment: occasionally  . Drug use: No  . Sexual activity: No  Other Topics Concern  . Not on file  Social History Narrative  . Not on file   Past Surgical History:  Procedure Laterality Date  . ABDOMINAL HYSTERECTOMY    . CORONARY ANGIOPLASTY WITH STENT PLACEMENT    . FOOT SURGERY Bilateral   . KNEE ARTHROSCOPY W/ MENISCECTOMY Left    Also has R knee injury (torn cartilage)    Family History  Problem Relation Age of Onset  . Cancer Mother   . Hypertension Mother   . Hypertension Father   . Early death Father   . Heart disease Father    Allergies  Allergen Reactions  . Atorvastatin Other (See Comments)    MYALGIA  . Cyclobenzaprine Other (See Comments)  . Hydrocodone-Acetaminophen     Other reaction(s): Hallucination when taking whole pill, pt tolerates taking 1/2 pill  . Mirtazapine     Other reaction(s): Hallucination  . Oxycodone-Acetaminophen Other (See Comments)  . Paroxetine Hcl Other (See Comments)  . Penicillins Swelling    Lip and orbital swelling  . Propoxyphene Other (See Comments)  . Ropinirole     Other reaction(s): Hallucination  . Trazodone Other (See Comments)      Assessment & Plan:  Patient presents self-referred or bilateral lower extremity pain and swelling. The patient reports a long-standing history of pain which starts at night when she is "relaxing".The patient experiences cramping to the bilateral lower extremities while she is laying in bed. The patient states her cramping has progressively worsened prompting her to seek medical attention. The patient states that this does not always happen with activity. The patient denies any claudication-like symptoms, rest pain or ulceration to the lower extremity. The patient denies any trauma or surgery to the lower extremity. The patient denies history of DVT. The patient's  discomfort has progressive the point she is unable to function on a daily basis. The patient's symptoms are lifestyle limiting. At this time, the patient has not started engaging in conservative therapy. The patient denies any fever, nausea or vomiting. The patient underwent a bilateral lower extremity venous reflux exam this was notable for no evidence of deep vein thrombosis of the bilateral lower extremity.No evidence of superficial thrombophlebitis of the bilateral lower extremity. Incompetence of the bilateral great saphenous veins. Incompetence of the right small saphenous vein.  1. Bilateral lower extremity edema - New Study reviewed with patient. I have discussed chronic venous insufficiency with the patient, why it causes symptoms and how to manage them.  The patient was encouraged to wear graduated compression stockings (20-30 mmHg) on a daily basis. The patient was instructed to begin wearing the stockings first thing in the morning and removing them in the evening. The patient was instructed specifically not to sleep in the stockings. Prescription given In addition,  behavioral modification including elevation during the day will be initiated. Anti-inflammatories for pain. The patient will follow up in one months to asses conservative management.  Information on chronic venous insufficiency and compression stockings was given to the patient. The patient was instructed to call the office in the interim if any worsening edema or ulcerations to the legs, feet or toes occurs. The patient expresses their understanding.  2. Lower extremity pain, bilateral - New Patient presents with bilateral calf cramping Unable to palpate pedal pulses on exam I will bring the patient back as soon as possible to have her undergo an ABI to assess for any peripheral artery disease that may be contributing to her calf claudication Patient does have multiple risk factors for peripheral artery disease  - VAS [place]  ABI WITH/WO TBI; Future  Current Outpatient Medications on File Prior to Visit  Medication Sig Dispense Refill  . albuterol (PROAIR HFA) 108 (90 Base) MCG/ACT inhaler Inhale into the lungs.    Mar[place] Kitchen aspirin EC 81 MG tablet Take 1 tablet by mouth daily.    Marland Kitchen azelastine (ASTELIN) 0.1 % nasal spray Place 2 sprays into the nose.    . benazepril (LOTENSIN) 40 MG tablet Take 1 tablet (40 mg total) by mouth daily. 90 tablet 3  . cetirizine (ZYRTEC) 10 MG tablet Take 1 tablet by mouth daily.    . Cholecalciferol (VITAMIN D-1000 MAX ST) 1000 units tablet Take by mouth.    . clonazePAM (KLONOPIN) 0.5 MG tablet Take by mouth.    Marland Kitchen glimepiride (AMARYL) 2 MG tablet Take 0.5 tablets by mouth daily. Take 0.5 tablet daily with largest meal    . glucose blood test strip Use once daily. Use as instructed.    Marland Kitchen HYDROcodone-acetaminophen (NORCO/VICODIN) 5-325 MG tablet 1/2 po bid prn Earliest Fill Date: 07/19/17    . latanoprost (XALATAN) 0.005 % ophthalmic solution Apply to eye.    . levothyroxine (SYNTHROID, LEVOTHROID) 75 MCG tablet Take 1 tablet by mouth daily. Take on an empty stomach 30 to 60 minutes before breakfast.    . LOTREL 5-20 MG capsule     . meloxicam (MOBIC) 15 MG tablet TAKE 1 TABLET (15 MG TOTAL) BY MOUTH DAILY WITH BREAKFAST.  0  . metoprolol succinate (TOPROL-XL) 50 MG 24 hr tablet Take 1 tablet by mouth daily.    . rosuvastatin (CRESTOR) 40 MG tablet Take 1 tablet by mouth daily.    . timolol (BETIMOL) 0.5 % ophthalmic solution Apply 1 drop to eye daily. Place one 1 drop into both eyes every morning.    . timolol (TIMOPTIC) 0.5 % ophthalmic solution INSTILL 1 DROP INTO EACH EYE EVERY MORNING AS DIRECTED  0  . tizanidine (ZANAFLEX) 2 MG capsule TAKE 1 CAPSULE (2 MG TOTAL) BY MOUTH 3 (THREE) TIMES DAILY AS NEEDED FOR MUSCLE SPASMS.  0  . triamcinolone cream (KENALOG) 0.1 % APPLY TO AFFECTED AREA TWICE A DAY AS NEEDED    . Vitamin D, Ergocalciferol, (DRISDOL) 50000 units CAPS capsule Take by  mouth.    . gabapentin (NEURONTIN) 300 MG capsule Take 1 capsule by mouth 3 (three) times daily.     Marland Kitchen rOPINIRole (REQUIP) 0.5 MG tablet Take 1 tablet by mouth 2 (two) times daily.     No current facility-administered medications on file prior to visit.    There are no Patient Instructions on file for this visit. No Follow-up on file.  Jilene Spohr A Canden Cieslinski, PA-C

## 2017-09-05 ENCOUNTER — Encounter (INDEPENDENT_AMBULATORY_CARE_PROVIDER_SITE_OTHER): Payer: Medicare Other

## 2017-09-05 ENCOUNTER — Ambulatory Visit (INDEPENDENT_AMBULATORY_CARE_PROVIDER_SITE_OTHER): Payer: Medicare Other | Admitting: Vascular Surgery

## 2018-02-26 ENCOUNTER — Ambulatory Visit: Payer: Medicare Other

## 2018-02-27 ENCOUNTER — Ambulatory Visit: Payer: Medicare Other | Attending: Family Medicine

## 2018-02-27 DIAGNOSIS — R293 Abnormal posture: Secondary | ICD-10-CM | POA: Insufficient documentation

## 2018-02-27 DIAGNOSIS — R2689 Other abnormalities of gait and mobility: Secondary | ICD-10-CM | POA: Insufficient documentation

## 2018-02-27 DIAGNOSIS — M546 Pain in thoracic spine: Secondary | ICD-10-CM | POA: Diagnosis present

## 2018-02-27 DIAGNOSIS — M62830 Muscle spasm of back: Secondary | ICD-10-CM

## 2018-02-28 NOTE — Therapy (Signed)
[facility] [address] [place], [address] Phone: [phone]   Fax:  [phone]  Physical Therapy Evaluation  Patient Details  Name: Cassandra [name] MRN: [id number] Date of Birth: 08/21/1927 Referring Provider: Harvest Dark, FNP    Encounter Date: 02/27/2018  PT End of Session - 02/27/18 1821    Visit Number  1    Number of Visits  13    Date for PT Re-Evaluation  04/10/18    Authorization Type  Progress note (1/10)    PT Start Time  1645    PT Stop Time  1730    PT Time Calculation (min)  45 min    Activity Tolerance  Patient tolerated treatment well;No increased pain    Behavior During Therapy  WFL for tasks assessed/performed       Past Medical History:  Diagnosis Date  . Arthritis    osteoarthritis in hands, knees ( R torn cartilage, L meniscus removed)   . CAD (coronary artery disease)   . Diabetes mellitus without complication ([place])   . Glaucoma   . Hyperlipidemia   . Hypertension   . Hypothyroidism     Past Surgical History:  Procedure Laterality Date  . ABDOMINAL HYSTERECTOMY    . CORONARY ANGIOPLASTY WITH STENT PLACEMENT    . FOOT SURGERY Bilateral   . KNEE ARTHROSCOPY W/ MENISCECTOMY Left    Also has R knee injury (torn cartilage)     There were no vitals filed for this visit.   Subjective Assessment - 02/27/18 1810    Subjective  Pt reports onset of thoracic back spasm approximately one month ago (05/01) and states that certain movements cause severe, intense muscle spasms in one specific location (R thoracic paraspinals) that "takes breath away". Pt also reports broken rib 1 year ago that she believes may be a factor on what caused the muscle spasms. Pt reports that pain is worse at night when lying on R side and when pressure is directly on the location of pain. Pt also stated that wearing a bra irritates the area of pain. Pt does not sleep well and reports that the pain  wakes her up at night and that she has trouble going back to sleep. No recent unexplained weight loss or weight gain. Current pain reported as 4-5/10. Worse pain 10/10. Worst pain in past week 5-6/10. Dishes and household chores increase pain as well as any pushing/pulling activities. Pain lasts approximately 15s-15min. Pain eases when lying on L and letting R arm relax. Ice also eases pain. Pt hobbies include crocheting and reading.     Pertinent History  PMH: broken rib 1 yr ago and hx of LBP    Limitations  Lifting;House hold activities    Patient Stated Goals  decrease pain, return to prior level of function     Currently in Pain?  Yes    Pain Score  -- 4-5/10    Pain Location  Back    Pain Orientation  Right;Mid    Pain Descriptors / Indicators  Spasm    Pain Type  Acute pain    Pain Onset  1 to 4 weeks ago    Pain Frequency  Occasional         [facility] PT Assessment - 02/28/18 1448      Assessment   Medical Diagnosis  Back muscle spasm    Referring Provider  Meeler, Sherren Kerns, FNP     Onset Date/Surgical Date  01/28/18    Next MD Visit  Unknown    Prior Therapy  Yes      Precautions   Precautions  None      Balance Screen   Has the patient fallen in the past 6 months  No    Has the patient had a decrease in activity level because of a fear of falling?   Yes    Is the patient reluctant to leave their home because of a fear of falling?   No      Prior Function   Level of Independence  Independent    Vocation  Retired    Biomedical scientist  N/A    Leisure  crochet and read      Cognition   Overall Cognitive Status  Within Functional Limits for tasks assessed      Observation/Other Assessments   Observations  forward head and thoracic kyphosis      Sensation   Light Touch  Appears Intact      Posture/Postural Control   Posture Comments  FHP, increased kyphosis in sitting       ROM / Strength   AROM / PROM / Strength  AROM      AROM   Overall AROM   Deficits     AROM Assessment Site  Thoracic;Lumbar;Shoulder    Right/Left Shoulder  Right;Left    Right Shoulder Flexion  -- WNL, pain in back at end range    Right Shoulder ABduction  -- WNL    Left Shoulder Flexion  -- WNL, pain in back at end range    Left Shoulder ABduction  -- WNL    Lumbar Flexion  WNL    Lumbar Extension  25% limited no increase in pain    Lumbar - Right Side Bend  limited by pain by 25%    Lumbar - Left Side Bend  WNL    Thoracic - Right Rotation  85% limited    Thoracic - Left Rotation  85% limited      Flexibility   Soft Tissue Assessment /Muscle Length  --      Palpation   Spinal mobility  Hypomobility aling T5-9 centrally    Palpation comment  tender to palpation on R paraspinal musculature between T4-T9      Ambulation/Gait   Gait Comments  Decreased hip extension in standing         Objective measurements completed on examination: See above findings.   TREATMENT Therapeutic Exercise: Scapular retraction in sitting -- 2 x 15 with tactile and verbal cueing with performance Standing scapular rows with GTB -- 2 x 15   Patient demonstrates improvement in pain at the end of the session    PT Education - 02/27/18 1820    Education Details  POC, HEP: scapular retraction and rows with GTB    Person(s) Educated  Patient    Methods  Explanation;Demonstration;Tactile cues;Handout    Comprehension  Verbalized understanding;Returned demonstration;Tactile cues required          PT Long Term Goals - 02/28/18 1424      PT LONG TERM GOAL #1   Title  Pt will report worst pain in past week less than 4/10 in order to increase functional mobility and tolerance to household chores.    Baseline  6/10    Time  6    Period  Weeks    Status  New    Target Date  03/28/18      PT LONG  TERM GOAL #2   Title  Pt will report worst pain in past week less than 2/10 in order to increase funcitonal mobility and tolerance to household chores.    Baseline  6/10    Time  6     Period  Weeks    Status  New    Target Date  04/11/18      PT LONG TERM GOAL #3   Title  Pt will be independent and compliant with HEP in or to continue benefits of therapy after discharge.    Time  4    Period  Weeks    Status  New    Target Date  03/28/18      PT LONG TERM GOAL #4   Title  Pt will report completing all household chores with no pain demonstrating return to prior level of function.    Time  6    Period  Weeks    Status  New    Target Date  04/11/18             Plan - 02/28/18 1403    Clinical Impression Statement  Pt is a 84 yo female presenting with thoraic/mid back dysfunction with tenderness to palpation along paraspinal musculature and muscle spasm in paraspinals . Pt also demonstrates decreased thoracic rotation, pain upon BUE flexion and pain with lateral trunk flexion. Pt is unable to perform household chores and has trouble sleeping due to pain. Patient also demonstrates significant forward head posture and thoracic kyphosis. Patient will benefit from skilled PT to address impairments listed above in order to return to prior level of function.    History and Personal Factors relevant to plan of care:  Patient has history of back pain    Clinical Presentation  Stable    Clinical Presentation due to:  Patient's pain has been improving    Clinical Decision Making  Low    Rehab Potential  Good    Clinical Impairments Affecting Rehab Potential  (+) highly motivated (-) age, chronicity     PT Frequency  2x / week    PT Duration  6 weeks    PT Treatment/Interventions  ADLs/Self Care Home Management;Cryotherapy;Electrical Stimulation;Moist Heat;Ultrasound;Gait training;Functional mobility training;Therapeutic activities;Therapeutic exercise;Balance training;Neuromuscular re-education;Patient/family education;Manual techniques;Passive range of motion;Dry needling    PT Next Visit Plan  Back strengthening exercises, thoracic mobility exercises, manual therapy to  thoracic/lumbar spine    PT Home Exercise Plan  See education    Consulted and Agree with Plan of Care  Patient       Patient will benefit from skilled therapeutic intervention in order to improve the following deficits and impairments:  Increased muscle spasms, Postural dysfunction, Impaired UE functional use, Pain, Decreased activity tolerance  Visit Diagnosis: Muscle spasm of back  Abnormal posture  Decreased back mobility  Pain in thoracic spine     Problem List Patient Active Problem List   Diagnosis Date Noted  . Essential hypertension 01/24/2015  . 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 Restless legs syndrome 01/24/2015  . Chronic constipation 01/24/2015    Blythe Stanford, PT DPT 02/28/2018, 3:40 PM  [facility] [address] [place], [address] Phone: [phone]   Fax:  [phone]  Name: Cassandra [name] MRN: [id number] Date of Birth: [birth year]

## 2018-03-06 ENCOUNTER — Ambulatory Visit: Payer: Medicare Other

## 2018-03-12 ENCOUNTER — Ambulatory Visit: Payer: Medicare Other

## 2018-03-14 ENCOUNTER — Ambulatory Visit: Payer: Medicare Other

## 2018-03-20 ENCOUNTER — Ambulatory Visit: Payer: Medicare Other

## 2018-03-25 ENCOUNTER — Ambulatory Visit: Payer: Medicare Other

## 2018-03-25 DIAGNOSIS — E559 Vitamin D deficiency, unspecified: Secondary | ICD-10-CM | POA: Insufficient documentation

## 2018-03-27 ENCOUNTER — Ambulatory Visit: Payer: Medicare Other

## 2018-04-01 ENCOUNTER — Ambulatory Visit: Payer: Medicare Other

## 2018-04-03 ENCOUNTER — Ambulatory Visit: Payer: Medicare Other

## 2019-04-01 ENCOUNTER — Other Ambulatory Visit: Payer: Self-pay | Admitting: Neurology

## 2019-04-01 DIAGNOSIS — M5416 Radiculopathy, lumbar region: Secondary | ICD-10-CM

## 2019-04-14 ENCOUNTER — Ambulatory Visit: Payer: Medicare Other

## 2019-10-16 ENCOUNTER — Ambulatory Visit: Payer: Self-pay | Admitting: Urology

## 2019-11-17 ENCOUNTER — Ambulatory Visit: Payer: Self-pay | Admitting: Urology

## 2020-02-18 ENCOUNTER — Inpatient Hospital Stay: Payer: Medicare Other | Attending: Oncology | Admitting: Oncology

## 2020-02-18 ENCOUNTER — Other Ambulatory Visit: Payer: Self-pay

## 2020-02-18 ENCOUNTER — Encounter (INDEPENDENT_AMBULATORY_CARE_PROVIDER_SITE_OTHER): Payer: Self-pay

## 2020-02-18 ENCOUNTER — Inpatient Hospital Stay: Payer: Medicare Other

## 2020-02-18 ENCOUNTER — Encounter: Payer: Self-pay | Admitting: Oncology

## 2020-02-18 VITALS — BP 154/74 | HR 62 | Temp 96.3°F | Resp 18 | Wt 164.0 lb

## 2020-02-18 DIAGNOSIS — I10 Essential (primary) hypertension: Secondary | ICD-10-CM

## 2020-02-18 DIAGNOSIS — D751 Secondary polycythemia: Secondary | ICD-10-CM

## 2020-02-18 DIAGNOSIS — Z7982 Long term (current) use of aspirin: Secondary | ICD-10-CM | POA: Diagnosis not present

## 2020-02-18 DIAGNOSIS — Z7984 Long term (current) use of oral hypoglycemic drugs: Secondary | ICD-10-CM | POA: Diagnosis not present

## 2020-02-18 DIAGNOSIS — E039 Hypothyroidism, unspecified: Secondary | ICD-10-CM | POA: Diagnosis not present

## 2020-02-18 DIAGNOSIS — I2510 Atherosclerotic heart disease of native coronary artery without angina pectoris: Secondary | ICD-10-CM | POA: Diagnosis not present

## 2020-02-18 DIAGNOSIS — D75839 Thrombocytosis, unspecified: Secondary | ICD-10-CM | POA: Insufficient documentation

## 2020-02-18 DIAGNOSIS — R7989 Other specified abnormal findings of blood chemistry: Secondary | ICD-10-CM | POA: Diagnosis present

## 2020-02-18 DIAGNOSIS — Z79899 Other long term (current) drug therapy: Secondary | ICD-10-CM

## 2020-02-18 DIAGNOSIS — D75838 Other thrombocytosis: Secondary | ICD-10-CM | POA: Insufficient documentation

## 2020-02-18 DIAGNOSIS — Z87891 Personal history of nicotine dependence: Secondary | ICD-10-CM | POA: Diagnosis not present

## 2020-02-18 DIAGNOSIS — E119 Type 2 diabetes mellitus without complications: Secondary | ICD-10-CM | POA: Diagnosis not present

## 2020-02-18 LAB — IRON AND TIBC
Iron: 58 ug/dL (ref 28–170)
Saturation Ratios: 16 % (ref 10.4–31.8)
TIBC: 357 ug/dL (ref 250–450)
UIBC: 299 ug/dL

## 2020-02-18 LAB — CBC WITH DIFFERENTIAL/PLATELET
Abs Immature Granulocytes: 0 10*3/uL (ref 0.00–0.07)
Basophils Absolute: 0 10*3/uL (ref 0.0–0.1)
Basophils Relative: 0 %
Eosinophils Absolute: 0 10*3/uL (ref 0.0–0.5)
Eosinophils Relative: 0 %
HCT: 48.6 % — ABNORMAL HIGH (ref 36.0–46.0)
Hemoglobin: 15.1 g/dL — ABNORMAL HIGH (ref 12.0–15.0)
Lymphocytes Relative: 13 %
Lymphs Abs: 1.7 10*3/uL (ref 0.7–4.0)
MCH: 28.1 pg (ref 26.0–34.0)
MCHC: 31.1 g/dL (ref 30.0–36.0)
MCV: 90.5 fL (ref 80.0–100.0)
Monocytes Absolute: 0.1 10*3/uL (ref 0.1–1.0)
Monocytes Relative: 1 %
Neutro Abs: 10.9 10*3/uL — ABNORMAL HIGH (ref 1.7–7.7)
Neutrophils Relative %: 86 %
Platelets: 552 10*3/uL — ABNORMAL HIGH (ref 150–400)
RBC: 5.37 MIL/uL — ABNORMAL HIGH (ref 3.87–5.11)
RDW: 17.7 % — ABNORMAL HIGH (ref 11.5–15.5)
Smear Review: NORMAL
WBC: 12.7 10*3/uL — ABNORMAL HIGH (ref 4.0–10.5)
nRBC: 0 % (ref 0.0–0.2)

## 2020-02-18 LAB — FERRITIN: Ferritin: 47 ng/mL (ref 11–307)

## 2020-02-18 LAB — TECHNOLOGIST SMEAR REVIEW: Plt Morphology: NORMAL

## 2020-02-19 DIAGNOSIS — D751 Secondary polycythemia: Secondary | ICD-10-CM | POA: Insufficient documentation

## 2020-02-19 NOTE — Progress Notes (Signed)
Hematology/Oncology Consult note [facility] [phone] [phone]   Patient Care Team: Dion Body, MD as PCP - General (Family Medicine)  REFERRING PROVIDER: Dion Body, MD  Dr.Singh Harmeet  CHIEF COMPLAINTS/REASON FOR VISIT:  Evaluation of thrombocytosis  HISTORY OF PRESENTING ILLNESS:  Cassandra [name] is a 86 y.o. female who was seen in consultation at the request of Dion Body, MD/ Dr.Singh Harmeet for evaluation of thrombocytosis  Reviewed patient's labs.  01/30/2020 labs showed elevated platelet counts at   619,000. wbc 12.7, and hemoglobin 14.9. Reviewed patient's previous labs. Thrombocytosis onset is chronic onset , duration since at least  04/14/2019 No aggravating or elevated factors. Associated symptoms or signs:  Denies weight loss, fever, chills, fatigue, night sweats.  Context:  Smoking history: Former smoker.  She quitted smoking in 37s Family history of polycythemia.  Denies History of iron deficiency anemia; reports remote history of iron deficiency. History of DVT: Denies   Review of Systems  Constitutional: Negative for appetite change, chills, fatigue and fever.  HENT:   Negative for hearing loss and voice change.   Eyes: Negative for eye problems.  Respiratory: Negative for chest tightness and cough.   Cardiovascular: Negative for chest pain.  Gastrointestinal: Negative for abdominal distention, abdominal pain and blood in stool.  Endocrine: Negative for hot flashes.  Genitourinary: Negative for difficulty urinating and frequency.   Musculoskeletal: Negative for arthralgias.  Skin: Negative for itching and rash.  Neurological: Negative for extremity weakness.  Hematological: Negative for adenopathy.  Psychiatric/Behavioral: Negative for confusion.    MEDICAL HISTORY:  Past Medical History:  Diagnosis Date  . Arthritis    osteoarthritis in hands, knees ( R torn cartilage, L  meniscus removed)   . CAD (coronary artery disease)   . Diabetes mellitus without complication ([place])   . Glaucoma   . Hyperlipidemia   . Hypertension   . Hypothyroidism     SURGICAL HISTORY: Past Surgical History:  Procedure Laterality Date  . ABDOMINAL HYSTERECTOMY    . CORONARY ANGIOPLASTY WITH STENT PLACEMENT    . FOOT SURGERY Bilateral   . KNEE ARTHROSCOPY W/ MENISCECTOMY Left    Also has R knee injury (torn cartilage)     SOCIAL HISTORY: Social History   Socioeconomic History  . Marital status: Married    Spouse name: Not on file  . Number of children: Not on file  . Years of education: Not on file  . Highest education level: Not on file  Occupational History  . Not on file  Tobacco Use  . Smoking status: Former Smoker    Quit date: 01/21/1980    Years since quitting: 40.1  . Smokeless tobacco: Never Used  Substance and Sexual Activity  . Alcohol use: Yes    Alcohol/week: 0.0 standard drinks    Comment: occasionally  . Drug use: No  . Sexual activity: Never  Other Topics Concern  . Not on file  Social History Narrative  . Not on file   Social Determinants of Health   Financial Resource Strain:   . Difficulty of Paying Living Expenses:   Food Insecurity:   . Worried About Charity fundraiser in the Last Year:   . Arboriculturist in the Last Year:   Transportation Needs:   . Film/video editor (Medical):   Marland Kitchen Lack of Transportation (Non-Medical):   Physical Activity:   . Days of Exercise per Week:   . Minutes of Exercise per Session:   Stress:   .  Feeling of Stress :   Social Connections:   . Frequency of Communication with Friends and Family:   . Frequency of Social Gatherings with Friends and Family:   . Attends Religious Services:   . Active Member of Clubs or Organizations:   . Attends Archivist Meetings:   Marland Kitchen Marital Status:   Intimate Partner Violence:   . Fear of Current or Ex-Partner:   . Emotionally Abused:   Marland Kitchen Physically  Abused:   . Sexually Abused:     FAMILY HISTORY: Family History  Problem Relation Age of Onset  . Hypertension Mother   . Hypertension Father   . Early death Father   . Heart disease Father     ALLERGIES:  is allergic to atorvastatin; cyclobenzaprine; hydrocodone-acetaminophen; mirtazapine; oxycodone-acetaminophen; paroxetine hcl; penicillins; propoxyphene; ropinirole; and trazodone.  MEDICATIONS:  Current Outpatient Medications  Medication Sig Dispense Refill  . albuterol (PROAIR HFA) 108 (90 Base) MCG/ACT inhaler Inhale into the lungs.    Marland Kitchen aspirin EC 81 MG tablet Take 1 tablet by mouth daily.    Marland Kitchen azelastine (ASTELIN) 0.1 % nasal spray Place 2 sprays into the nose.    . cetirizine (ZYRTEC) 10 MG tablet Take 1 tablet by mouth daily.    Marland Kitchen gabapentin (NEURONTIN) 300 MG capsule Take 1 capsule by mouth 3 (three) times daily.     Marland Kitchen glimepiride (AMARYL) 2 MG tablet Take 0.5 tablets by mouth daily. Take 0.5 tablet daily with largest meal    . glucose blood test strip Use once daily. Use as instructed.    Marland Kitchen levothyroxine (SYNTHROID, LEVOTHROID) 75 MCG tablet Take 1 tablet by mouth daily. Take on an empty stomach 30 to 60 minutes before breakfast.    . LOTREL 5-20 MG capsule     . metoprolol succinate (TOPROL-XL) 50 MG 24 hr tablet Take 1 tablet by mouth daily.    . rosuvastatin (CRESTOR) 40 MG tablet Take 1 tablet by mouth daily.    . timolol (BETIMOL) 0.5 % ophthalmic solution Apply 1 drop to eye daily. Place one 1 drop into both eyes every morning.    . timolol (TIMOPTIC) 0.5 % ophthalmic solution INSTILL 1 DROP INTO EACH EYE EVERY MORNING AS DIRECTED  0  . benazepril (LOTENSIN) 40 MG tablet Take 1 tablet (40 mg total) by mouth daily. (Patient not taking: Reported on 02/18/2020) 90 tablet 3  . Cholecalciferol (VITAMIN D-1000 MAX ST) 1000 units tablet Take by mouth.    . clonazePAM (KLONOPIN) 0.5 MG tablet Take by mouth.    Marland Kitchen HYDROcodone-acetaminophen (NORCO/VICODIN) 5-325 MG tablet 1/2  po bid prn Earliest Fill Date: 07/19/17    . latanoprost (XALATAN) 0.005 % ophthalmic solution Apply to eye.    . meloxicam (MOBIC) 15 MG tablet TAKE 1 TABLET (15 MG TOTAL) BY MOUTH DAILY WITH BREAKFAST.  0  . rOPINIRole (REQUIP) 0.5 MG tablet Take 1 tablet by mouth 2 (two) times daily.    . tizanidine (ZANAFLEX) 2 MG capsule TAKE 1 CAPSULE (2 MG TOTAL) BY MOUTH 3 (THREE) TIMES DAILY AS NEEDED FOR MUSCLE SPASMS.  0  . triamcinolone cream (KENALOG) 0.1 % APPLY TO AFFECTED AREA TWICE A DAY AS NEEDED     No current facility-administered medications for this visit.     PHYSICAL EXAMINATION: ECOG PERFORMANCE STATUS: 1 - Symptomatic but completely ambulatory Vitals:   02/18/20 1521  BP: (!) 154/74  Pulse: 62  Resp: 18  Temp: (!) 96.3 F (35.7 C)   Filed Weights  02/18/20 1521  Weight: 164 lb (74.4 kg)    Physical Exam Constitutional:      General: She is not in acute distress.    Comments: Patient walks independently  HENT:     Head: Normocephalic and atraumatic.  Eyes:     General: No scleral icterus. Cardiovascular:     Rate and Rhythm: Normal rate and regular rhythm.     Heart sounds: Normal heart sounds.  Pulmonary:     Effort: Pulmonary effort is normal. No respiratory distress.     Breath sounds: No wheezing.  Abdominal:     General: Bowel sounds are normal. There is no distension.     Palpations: Abdomen is soft.  Musculoskeletal:        General: No deformity. Normal range of motion.     Cervical back: Normal range of motion and neck supple.  Skin:    General: Skin is warm and dry.     Findings: No erythema or rash.  Neurological:     Mental Status: She is alert and oriented to person, place, and time. Mental status is at baseline.     Cranial Nerves: No cranial nerve deficit.     Coordination: Coordination normal.  Psychiatric:        Mood and Affect: Mood normal.      LABORATORY DATA:  I have reviewed the data as listed Lab Results  Component Value  Date   WBC 12.7 (H) 02/18/2020   HGB 15.1 (H) 02/18/2020   HCT 48.6 (H) 02/18/2020   MCV 90.5 02/18/2020   PLT 552 (H) 02/18/2020   No results for input(s): NA, K, CL, CO2, GLUCOSE, BUN, CREATININE, CALCIUM, GFRNONAA, GFRAA, PROT, ALBUMIN, AST, ALT, ALKPHOS, BILITOT, BILIDIR, IBILI in the last 8760 hours. Iron/TIBC/Ferritin/ %Sat    Component Value Date/Time   IRON 58 02/18/2020 1529   TIBC 357 02/18/2020 1529   FERRITIN 47 02/18/2020 1529   IRONPCTSAT 16 02/18/2020 1529   IRONPCTSAT 18 (L) 01/21/2015 1207      RADIOGRAPHIC STUDIES: I have personally reviewed the radiological images as listed and agreed with the findings in the report. No results found.    ASSESSMENT & PLAN:  1. Thrombocytosis ([place])   2. Erythrocytosis    I discussed with patient that the differential diagnosis of the thrombosis/erythrocytosis is broad, including benign etiology such as reactive to surgery, trauma, infection, nutrition deficiency, etc, as well as malignant etiology including underlying bone marrow disorders.   For the work up of patient's thrombocytosis, I recommend checking CBC;CMP, smear review, iron,TIBC, ferritin and JAK 2 mutation,  BCR ABL; MPL; CALR mutation.   Also discussed possible bone marrow biopsy if above workup is inconclusive; however I would prefer not to do a bone marrow unless absolutely needed. I discussed the potential concerns for stroke with elevated platelets. Patient is on aspirin.    Orders Placed This Encounter  Procedures  . CBC with Differential/Platelet    Standing Status:   Future    Number of Occurrences:   1    Standing Expiration Date:   02/17/2021  . Technologist smear review    Standing Status:   Future    Number of Occurrences:   1    Standing Expiration Date:   02/17/2021  . JAK2 V617F, w Reflex to CALR/E12/MPL    Standing Status:   Future    Number of Occurrences:   1    Standing Expiration Date:   02/17/2021  . Iron and TIBC  Standing Status:    Future    Number of Occurrences:   1    Standing Expiration Date:   02/17/2021  . Ferritin    Standing Status:   Future    Number of Occurrences:   1    Standing Expiration Date:   08/20/2020  . BCR-ABL1 FISH    Standing Status:   Future    Number of Occurrences:   1    Standing Expiration Date:   02/17/2021    All questions were answered. The patient knows to call the clinic with any problems questions or concerns.  Return of visit: 2 weeks Thank you for this kind referral and the opportunity to participate in the care of this patient. A copy of today's note is routed to referring provider     Earlie Server, MD, PhD 02/19/2020

## 2020-02-22 LAB — JAK2 V617F, W REFLEX TO CALR/E12/MPL

## 2020-02-26 LAB — BCR-ABL1 FISH
Cells Analyzed: 200
Cells Counted: 200

## 2020-03-05 ENCOUNTER — Other Ambulatory Visit: Payer: Self-pay

## 2020-03-05 ENCOUNTER — Encounter: Payer: Self-pay | Admitting: Oncology

## 2020-03-05 ENCOUNTER — Inpatient Hospital Stay: Payer: Medicare Other | Attending: Oncology | Admitting: Oncology

## 2020-03-05 VITALS — BP 177/72 | HR 65 | Temp 96.5°F | Resp 18 | Wt 167.1 lb

## 2020-03-05 DIAGNOSIS — D751 Secondary polycythemia: Secondary | ICD-10-CM | POA: Insufficient documentation

## 2020-03-05 DIAGNOSIS — Z7982 Long term (current) use of aspirin: Secondary | ICD-10-CM | POA: Insufficient documentation

## 2020-03-05 DIAGNOSIS — E039 Hypothyroidism, unspecified: Secondary | ICD-10-CM | POA: Diagnosis not present

## 2020-03-05 DIAGNOSIS — H409 Unspecified glaucoma: Secondary | ICD-10-CM | POA: Diagnosis not present

## 2020-03-05 DIAGNOSIS — Z1589 Genetic susceptibility to other disease: Secondary | ICD-10-CM | POA: Diagnosis not present

## 2020-03-05 DIAGNOSIS — Z87891 Personal history of nicotine dependence: Secondary | ICD-10-CM | POA: Diagnosis not present

## 2020-03-05 DIAGNOSIS — D75839 Thrombocytosis, unspecified: Secondary | ICD-10-CM

## 2020-03-05 DIAGNOSIS — D473 Essential (hemorrhagic) thrombocythemia: Secondary | ICD-10-CM | POA: Diagnosis not present

## 2020-03-05 DIAGNOSIS — I2510 Atherosclerotic heart disease of native coronary artery without angina pectoris: Secondary | ICD-10-CM | POA: Diagnosis not present

## 2020-03-05 DIAGNOSIS — E119 Type 2 diabetes mellitus without complications: Secondary | ICD-10-CM | POA: Insufficient documentation

## 2020-03-05 DIAGNOSIS — I10 Essential (primary) hypertension: Secondary | ICD-10-CM | POA: Diagnosis not present

## 2020-03-05 NOTE — Progress Notes (Signed)
Patient denies new problems/concerns today.   °

## 2020-03-06 NOTE — Progress Notes (Signed)
Hematology/Oncology Consult note [facility] Telephone:([phone] Fax:([phone]) [phone]   Patient Care Team: Dion Body, MD as PCP - General (Family Medicine)  REFERRING PROVIDER: Dion Body, MD  Dr.Singh Harmeet  CHIEF COMPLAINTS/REASON FOR VISIT:  Follow up for  thrombocytosis  HISTORY OF PRESENTING ILLNESS:  Cassandra [name] is a 86 y.o. female who was seen in consultation at the request of Dion Body, MD/ Dr.Singh Harmeet for evaluation of thrombocytosis  Reviewed patient's labs.  01/30/2020 labs showed elevated platelet counts at   619,000. wbc 12.7, and hemoglobin 14.9. Reviewed patient's previous labs. Thrombocytosis onset is chronic onset , duration since at least  04/14/2019 No aggravating or elevated factors. Associated symptoms or signs:  Denies weight loss, fever, chills, fatigue, night sweats.  Context:  Smoking history: Former smoker.  She quitted smoking in 60s Family history of polycythemia.  Denies History of iron deficiency anemia; reports remote history of iron deficiency. History of DVT: Denies INTERVAL HISTORY [name] is a 86 y.o. female who has above history reviewed by me today presents for follow up visit for management of thrombocytopenia Problems and complaints are listed below: She has had blood work done and presents to discuss results.    Review of Systems  Constitutional: Negative for appetite change, chills, fatigue and fever.  HENT:   Negative for hearing loss and voice change.   Eyes: Negative for eye problems.  Respiratory: Negative for chest tightness and cough.   Cardiovascular: Negative for chest pain.  Gastrointestinal: Negative for abdominal distention, abdominal pain and blood in stool.  Endocrine: Negative for hot flashes.  Genitourinary: Negative for difficulty urinating and frequency.   Musculoskeletal: Negative for arthralgias.  Skin: Negative for itching and rash.    Neurological: Negative for extremity weakness.  Hematological: Negative for adenopathy.  Psychiatric/Behavioral: Negative for confusion.    MEDICAL HISTORY:  Past Medical History:  Diagnosis Date  . Arthritis    osteoarthritis in hands, knees ( R torn cartilage, L meniscus removed)   . CAD (coronary artery disease)   . Diabetes mellitus without complication ([place])   . Glaucoma   . Hyperlipidemia   . Hypertension   . Hypothyroidism     SURGICAL HISTORY: Past Surgical History:  Procedure Laterality Date  . ABDOMINAL HYSTERECTOMY    . CORONARY ANGIOPLASTY WITH STENT PLACEMENT    . FOOT SURGERY Bilateral   . KNEE ARTHROSCOPY W/ MENISCECTOMY Left    Also has R knee injury (torn cartilage)     SOCIAL HISTORY: Social History   Socioeconomic History  . Marital status: Married    Spouse name: Not on file  . Number of children: Not on file  . Years of education: Not on file  . Highest education level: Not on file  Occupational History  . Not on file  Tobacco Use  . Smoking status: Former Smoker    Quit date: 01/21/1980    Years since quitting: 40.1  . Smokeless tobacco: Never Used  Substance and Sexual Activity  . Alcohol use: Yes    Alcohol/week: 0.0 standard drinks    Comment: occasionally  . Drug use: No  . Sexual activity: Never  Other Topics Concern  . Not on file  Social History Narrative  . Not on file   Social Determinants of Health   Financial Resource Strain:   . Difficulty of Paying Living Expenses:   Food Insecurity:   . Worried About Charity fundraiser in the Last Year:   . [employer]  of Food in the Last Year:   Transportation Needs:   . Film/video editor (Medical):   Marland Kitchen Lack of Transportation (Non-Medical):   Physical Activity:   . Days of Exercise per Week:   . Minutes of Exercise per Session:   Stress:   . Feeling of Stress :   Social Connections:   . Frequency of Communication with Friends and Family:   . Frequency of Social Gatherings  with Friends and Family:   . Attends Religious Services:   . Active Member of Clubs or Organizations:   . Attends Archivist Meetings:   Marland Kitchen Marital Status:   Intimate Partner Violence:   . Fear of Current or Ex-Partner:   . Emotionally Abused:   Marland Kitchen Physically Abused:   . Sexually Abused:     FAMILY HISTORY: Family History  Problem Relation Age of Onset  . Hypertension Mother   . Hypertension Father   . Early death Father   . Heart disease Father     ALLERGIES:  is allergic to atorvastatin, cyclobenzaprine, hydrocodone-acetaminophen, mirtazapine, oxycodone-acetaminophen, paroxetine hcl, penicillins, propoxyphene, ropinirole, and trazodone.  MEDICATIONS:  Current Outpatient Medications  Medication Sig Dispense Refill  . albuterol (PROAIR HFA) 108 (90 Base) MCG/ACT inhaler Inhale into the lungs.    Marland Kitchen aspirin EC 81 MG tablet Take 1 tablet by mouth daily.    Marland Kitchen azelastine (ASTELIN) 0.1 % nasal spray Place 2 sprays into the nose.    . benazepril (LOTENSIN) 40 MG tablet Take 1 tablet (40 mg total) by mouth daily. 90 tablet 3  . cetirizine (ZYRTEC) 10 MG tablet Take 1 tablet by mouth daily.    . Cholecalciferol (VITAMIN D-1000 MAX ST) 1000 units tablet Take by mouth.    . clonazePAM (KLONOPIN) 0.5 MG tablet Take by mouth.    . gabapentin (NEURONTIN) 300 MG capsule Take 1 capsule by mouth 3 (three) times daily.     Marland Kitchen glimepiride (AMARYL) 2 MG tablet Take 0.5 tablets by mouth daily. Take 0.5 tablet daily with largest meal    . glucose blood test strip Use once daily. Use as instructed.    Marland Kitchen HYDROcodone-acetaminophen (NORCO/VICODIN) 5-325 MG tablet 1/2 po bid prn Earliest Fill Date: 07/19/17    . latanoprost (XALATAN) 0.005 % ophthalmic solution Apply to eye.    . levothyroxine (SYNTHROID, LEVOTHROID) 75 MCG tablet Take 1 tablet by mouth daily. Take on an empty stomach 30 to 60 minutes before breakfast.    . LOTREL 5-20 MG capsule     . meloxicam (MOBIC) 15 MG tablet TAKE 1 TABLET  (15 MG TOTAL) BY MOUTH DAILY WITH BREAKFAST.  0  . metoprolol succinate (TOPROL-XL) 50 MG 24 hr tablet Take 1 tablet by mouth daily.    Marland Kitchen rOPINIRole (REQUIP) 0.5 MG tablet Take 1 tablet by mouth 2 (two) times daily.    . rosuvastatin (CRESTOR) 40 MG tablet Take 1 tablet by mouth daily.    . timolol (BETIMOL) 0.5 % ophthalmic solution Apply 1 drop to eye daily. Place one 1 drop into both eyes every morning.    . triamcinolone cream (KENALOG) 0.1 % APPLY TO AFFECTED AREA TWICE A DAY AS NEEDED    . timolol (TIMOPTIC) 0.5 % ophthalmic solution INSTILL 1 DROP INTO EACH EYE EVERY MORNING AS DIRECTED  0  . tizanidine (ZANAFLEX) 2 MG capsule TAKE 1 CAPSULE (2 MG TOTAL) BY MOUTH 3 (THREE) TIMES DAILY AS NEEDED FOR MUSCLE SPASMS. (Patient not taking: Reported on 03/05/2020)  0  No current facility-administered medications for this visit.     PHYSICAL EXAMINATION: ECOG PERFORMANCE STATUS: 1 - Symptomatic but completely ambulatory Vitals:   03/05/20 1405  BP: (!) 177/72  Pulse: 65  Resp: 18  Temp: (!) 96.5 F (35.8 C)   Filed Weights   03/05/20 1405  Weight: 167 lb 1.6 oz (75.8 kg)    Physical Exam Constitutional:      General: She is not in acute distress.    Comments: Patient walks independently  HENT:     Head: Normocephalic and atraumatic.  Eyes:     General: No scleral icterus. Cardiovascular:     Rate and Rhythm: Normal rate and regular rhythm.     Heart sounds: Normal heart sounds.  Pulmonary:     Effort: Pulmonary effort is normal. No respiratory distress.     Breath sounds: No wheezing.  Abdominal:     General: Bowel sounds are normal. There is no distension.     Palpations: Abdomen is soft.  Musculoskeletal:        General: No deformity. Normal range of motion.     Cervical back: Normal range of motion and neck supple.  Skin:    General: Skin is warm and dry.     Findings: No erythema or rash.  Neurological:     Mental Status: She is alert and oriented to person,  place, and time. Mental status is at baseline.     Cranial Nerves: No cranial nerve deficit.     Coordination: Coordination normal.  Psychiatric:        Mood and Affect: Mood normal.      LABORATORY DATA:  I have reviewed the data as listed Lab Results  Component Value Date   WBC 12.7 (H) 02/18/2020   HGB 15.1 (H) 02/18/2020   HCT 48.6 (H) 02/18/2020   MCV 90.5 02/18/2020   PLT 552 (H) 02/18/2020   No results for input(s): NA, K, CL, CO2, GLUCOSE, BUN, CREATININE, CALCIUM, GFRNONAA, GFRAA, PROT, ALBUMIN, AST, ALT, ALKPHOS, BILITOT, BILIDIR, IBILI in the last 8760 hours. Iron/TIBC/Ferritin/ %Sat    Component Value Date/Time   IRON 58 02/18/2020 1529   TIBC 357 02/18/2020 1529   FERRITIN 47 02/18/2020 1529   IRONPCTSAT 16 02/18/2020 1529   IRONPCTSAT 18 (L) 01/21/2015 1207      RADIOGRAPHIC STUDIES: I have personally reviewed the radiological images as listed and agreed with the findings in the report. No results found.    ASSESSMENT & PLAN:  1. Thrombocytosis ([place])   2. Erythrocytosis   3. JAK-2 gene mutation    Labs are reviewed and discussed with patient. 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  Patient has slightly elevated hemoglobin, hemoglobin did not reach polycythemia diagnostic criteria yet. I recommend bone marrow biopsy for further clarification. Discussed with patient about the procedure and the rationale of proceeding with the procedure. Patient would like to think about that and update me. I will need to check erythropoietin level in the future.  Patient is on aspirin.  All questions were answered. The patient knows to call the clinic with any problems questions or concerns.  Return of visit: To be determined. Thank you for this kind referral and the opportunity to participate in the care of this patient. A copy of today's note is routed to referring provider     Earlie Server, MD, PhD 03/06/2020

## 2020-03-08 ENCOUNTER — Other Ambulatory Visit: Payer: Self-pay

## 2020-03-08 DIAGNOSIS — D75839 Thrombocytosis, unspecified: Secondary | ICD-10-CM

## 2020-03-08 DIAGNOSIS — Z1589 Genetic susceptibility to other disease: Secondary | ICD-10-CM

## 2020-03-09 ENCOUNTER — Telehealth: Payer: Self-pay | Admitting: *Deleted

## 2020-03-09 ENCOUNTER — Telehealth: Payer: Self-pay

## 2020-03-09 NOTE — Telephone Encounter (Signed)
Pt notified of both appts. Pt read appts back.

## 2020-03-09 NOTE — Telephone Encounter (Signed)
Done.. Pt is scheduled to RTC on 03/30/20 @ 930  2 weeks after 03/15/20 BMB.

## 2020-03-09 NOTE — Telephone Encounter (Signed)
Cassandra [name] scheduled for her BMB on Mon 03/15/20 at 8:30a to arrive at 7:30a.  The IR nurse will call her and go over her instructions before her procedure.  Please schedule pt for MD follow up, 2 weeks after BMB. I will call to notify pt of appts.

## 2020-03-09 NOTE — Telephone Encounter (Signed)
Pt called on 03/08/20 in reference to her 03/05/20 MD visit. Pt called wanting  Dr. Tasia Catchings to proceed with her having a Bx done.MD and team was made aware.

## 2020-03-09 NOTE — Telephone Encounter (Signed)
Request for [facility] biopsy faxed to centralized scheduling on 6/14. Pending appt date.

## 2020-03-11 ENCOUNTER — Telehealth: Payer: Self-pay | Admitting: *Deleted

## 2020-03-12 ENCOUNTER — Other Ambulatory Visit: Payer: Self-pay | Admitting: Student

## 2020-03-15 ENCOUNTER — Other Ambulatory Visit: Payer: Self-pay

## 2020-03-15 ENCOUNTER — Ambulatory Visit
Admission: RE | Admit: 2020-03-15 | Discharge: 2020-03-15 | Disposition: A | Discharge status: Home / Self Care | Payer: Medicare Other | Source: Ambulatory Visit | Attending: Oncology | Admitting: Oncology

## 2020-03-15 DIAGNOSIS — Z1589 Genetic susceptibility to other disease: Secondary | ICD-10-CM

## 2020-03-15 DIAGNOSIS — D473 Essential (hemorrhagic) thrombocythemia: Secondary | ICD-10-CM | POA: Insufficient documentation

## 2020-03-15 DIAGNOSIS — R7989 Other specified abnormal findings of blood chemistry: Secondary | ICD-10-CM | POA: Insufficient documentation

## 2020-03-15 DIAGNOSIS — D75839 Thrombocytosis, unspecified: Secondary | ICD-10-CM

## 2020-03-15 DIAGNOSIS — D72829 Elevated white blood cell count, unspecified: Secondary | ICD-10-CM | POA: Diagnosis not present

## 2020-03-15 LAB — CBC WITH DIFFERENTIAL/PLATELET
Abs Immature Granulocytes: 0.12 10*3/uL — ABNORMAL HIGH (ref 0.00–0.07)
Basophils Absolute: 0.1 10*3/uL (ref 0.0–0.1)
Basophils Relative: 1 %
Eosinophils Absolute: 0.2 10*3/uL (ref 0.0–0.5)
Eosinophils Relative: 1 %
HCT: 49.8 % — ABNORMAL HIGH (ref 36.0–46.0)
Hemoglobin: 16.1 g/dL — ABNORMAL HIGH (ref 12.0–15.0)
Immature Granulocytes: 1 %
Lymphocytes Relative: 15 %
Lymphs Abs: 2.3 10*3/uL (ref 0.7–4.0)
MCH: 27.9 pg (ref 26.0–34.0)
MCHC: 32.3 g/dL (ref 30.0–36.0)
MCV: 86.2 fL (ref 80.0–100.0)
Monocytes Absolute: 1.5 10*3/uL — ABNORMAL HIGH (ref 0.1–1.0)
Monocytes Relative: 10 %
Neutro Abs: 11 10*3/uL — ABNORMAL HIGH (ref 1.7–7.7)
Neutrophils Relative %: 72 %
Platelets: 611 10*3/uL — ABNORMAL HIGH (ref 150–400)
RBC: 5.78 MIL/uL — ABNORMAL HIGH (ref 3.87–5.11)
RDW: 19 % — ABNORMAL HIGH (ref 11.5–15.5)
Smear Review: NORMAL
WBC: 15.2 10*3/uL — ABNORMAL HIGH (ref 4.0–10.5)
nRBC: 0 % (ref 0.0–0.2)

## 2020-03-15 LAB — GLUCOSE, CAPILLARY: Glucose-Capillary: 93 mg/dL (ref 70–99)

## 2020-03-15 MED ORDER — SODIUM CHLORIDE 0.9 % IV SOLN: INTRAVENOUS | Status: DC

## 2020-03-15 MED ORDER — FENTANYL CITRATE (PF) 100 MCG/2ML IJ SOLN
INTRAMUSCULAR | Status: AC | PRN
  Administered 2020-03-15 (×2): 25 ug via INTRAVENOUS

## 2020-03-15 MED ORDER — MIDAZOLAM HCL 2 MG/2ML IJ SOLN
INTRAMUSCULAR | Status: AC
  Filled 2020-03-15: qty 2

## 2020-03-15 MED ORDER — HEPARIN SOD (PORK) LOCK FLUSH 100 UNIT/ML IV SOLN
INTRAVENOUS | Status: AC
  Filled 2020-03-15: qty 5

## 2020-03-15 MED ORDER — MIDAZOLAM HCL 2 MG/2ML IJ SOLN
INTRAMUSCULAR | Status: AC | PRN
  Administered 2020-03-15 (×2): 0.5 mg via INTRAVENOUS

## 2020-03-15 MED ORDER — FENTANYL CITRATE (PF) 100 MCG/2ML IJ SOLN
INTRAMUSCULAR | Status: AC
  Filled 2020-03-15: qty 2

## 2020-03-15 NOTE — Procedures (Signed)
  Procedure: CT bone marrow biopsy L iliac EBL:   minimal Complications:  none immediate  See full dictation in [facility].  Dillard Cannon MD Main # [phone] Pager  [phone]

## 2020-03-15 NOTE — Progress Notes (Signed)
Patient clinically stable post BMB per Dr Vernard Gambles, tolerated well, denies complaints at this time. Awake/alert and oriented post procedure. Report given to [employer] in specials post procedure.questions answered. Received Versed 1mg  along with Fentanyl 40mcg IV for procedure.

## 2020-03-17 LAB — SURGICAL PATHOLOGY

## 2020-03-23 ENCOUNTER — Encounter (HOSPITAL_COMMUNITY): Payer: Self-pay | Admitting: Oncology

## 2020-03-30 ENCOUNTER — Encounter: Payer: Self-pay | Admitting: Oncology

## 2020-03-30 ENCOUNTER — Inpatient Hospital Stay: Payer: Medicare Other | Attending: Oncology | Admitting: Oncology

## 2020-03-30 ENCOUNTER — Other Ambulatory Visit: Payer: Self-pay

## 2020-03-30 ENCOUNTER — Inpatient Hospital Stay: Payer: Medicare Other

## 2020-03-30 VITALS — BP 154/67 | HR 65 | Temp 97.7°F | Resp 18 | Wt 163.9 lb

## 2020-03-30 DIAGNOSIS — D751 Secondary polycythemia: Secondary | ICD-10-CM | POA: Diagnosis not present

## 2020-03-30 DIAGNOSIS — Z7982 Long term (current) use of aspirin: Secondary | ICD-10-CM | POA: Insufficient documentation

## 2020-03-30 DIAGNOSIS — Z87891 Personal history of nicotine dependence: Secondary | ICD-10-CM | POA: Diagnosis not present

## 2020-03-30 DIAGNOSIS — D473 Essential (hemorrhagic) thrombocythemia: Secondary | ICD-10-CM | POA: Insufficient documentation

## 2020-03-30 DIAGNOSIS — Z1589 Genetic susceptibility to other disease: Secondary | ICD-10-CM

## 2020-03-30 DIAGNOSIS — D75839 Thrombocytosis, unspecified: Secondary | ICD-10-CM

## 2020-03-30 DIAGNOSIS — N184 Chronic kidney disease, stage 4 (severe): Secondary | ICD-10-CM | POA: Insufficient documentation

## 2020-03-30 LAB — CBC WITH DIFFERENTIAL/PLATELET
Abs Immature Granulocytes: 0.07 10*3/uL (ref 0.00–0.07)
Basophils Absolute: 0.1 10*3/uL (ref 0.0–0.1)
Basophils Relative: 1 %
Eosinophils Absolute: 0.2 10*3/uL (ref 0.0–0.5)
Eosinophils Relative: 2 %
HCT: 51 % — ABNORMAL HIGH (ref 36.0–46.0)
Hemoglobin: 16 g/dL — ABNORMAL HIGH (ref 12.0–15.0)
Immature Granulocytes: 1 %
Lymphocytes Relative: 17 %
Lymphs Abs: 1.6 10*3/uL (ref 0.7–4.0)
MCH: 27.8 pg (ref 26.0–34.0)
MCHC: 31.4 g/dL (ref 30.0–36.0)
MCV: 88.5 fL (ref 80.0–100.0)
Monocytes Absolute: 0.9 10*3/uL (ref 0.1–1.0)
Monocytes Relative: 10 %
Neutro Abs: 6.3 10*3/uL (ref 1.7–7.7)
Neutrophils Relative %: 69 %
Platelets: 539 10*3/uL — ABNORMAL HIGH (ref 150–400)
RBC: 5.76 MIL/uL — ABNORMAL HIGH (ref 3.87–5.11)
RDW: 18.6 % — ABNORMAL HIGH (ref 11.5–15.5)
Smear Review: INCREASED
WBC: 9.1 10*3/uL (ref 4.0–10.5)
nRBC: 0 % (ref 0.0–0.2)

## 2020-03-30 LAB — COMPREHENSIVE METABOLIC PANEL
ALT: 25 U/L (ref 0–44)
AST: 35 U/L (ref 15–41)
Albumin: 3.6 g/dL (ref 3.5–5.0)
Alkaline Phosphatase: 55 U/L (ref 38–126)
Anion gap: 8 (ref 5–15)
BUN: 36 mg/dL — ABNORMAL HIGH (ref 8–23)
CO2: 27 mmol/L (ref 22–32)
Calcium: 8.7 mg/dL — ABNORMAL LOW (ref 8.9–10.3)
Chloride: 103 mmol/L (ref 98–111)
Creatinine, Ser: 1.98 mg/dL — ABNORMAL HIGH (ref 0.44–1.00)
GFR calc Af Amer: 25 mL/min — ABNORMAL LOW (ref >60)
GFR calc non Af Amer: 22 mL/min — ABNORMAL LOW (ref >60)
Glucose, Bld: 128 mg/dL — ABNORMAL HIGH (ref 70–99)
Potassium: 4.8 mmol/L (ref 3.5–5.1)
Sodium: 138 mmol/L (ref 135–145)
Total Bilirubin: 0.5 mg/dL (ref 0.3–1.2)
Total Protein: 8.2 g/dL — ABNORMAL HIGH (ref 6.5–8.1)

## 2020-03-30 MED ORDER — HYDROXYUREA 300 MG PO CAPS: 300.0000 mg | ORAL_CAPSULE | Freq: Every day | ORAL | 0 refills | Status: DC

## 2020-03-30 NOTE — Progress Notes (Signed)
Patient here for follow up. No new concerns voiced.  °

## 2020-03-30 NOTE — Progress Notes (Signed)
Hematology/Oncology zofran note [facility] Telephone:([phone] Fax:(336) [phone]   Patient Care Team: Dion Body, MD as PCP - General (Family Medicine)  REFERRING PROVIDER: Dion Body, MD  Dr.Singh Harmeet  CHIEF COMPLAINTS/REASON FOR VISIT:  Follow up for  thrombocytosis  HISTORY OF PRESENTING ILLNESS:  Cassandra [name] is a 85 y.o. female who was seen in consultation at the request of Dion Body, MD/ Dr.Singh Harmeet for evaluation of thrombocytosis  Reviewed patient's labs.  01/30/2020 labs showed elevated platelet counts at   619,000. wbc 12.7, and hemoglobin 14.9. Reviewed patient's previous labs. Thrombocytosis onset is chronic onset , duration since at least  04/14/2019 No aggravating or elevated factors. Associated symptoms or signs:  Denies weight loss, fever, chills, fatigue, night sweats.  Context:  Smoking history: Former smoker.  She quitted smoking in 89s Family history of polycythemia.  Denies History of iron deficiency anemia; reports remote history of iron deficiency. History of DVT: Denies  INTERVAL HISTORY Cassandra [name] is a 86 y.o. female who has above history reviewed by me today presents for follow up visit for management of thrombocytosis.  Problems and complaints are listed below: Patient has had bone marrow biopsy done.  She presents to discuss results and management plan. She has no new complaints. She was accompanied by her son.   Review of Systems  Constitutional: Negative for appetite change, chills, fatigue and fever.  HENT:   Negative for hearing loss and voice change.   Eyes: Negative for eye problems.  Respiratory: Negative for chest tightness and cough.   Cardiovascular: Negative for chest pain.  Gastrointestinal: Negative for abdominal distention, abdominal pain and blood in stool.  Endocrine: Negative for hot flashes.  Genitourinary: Negative for difficulty urinating and frequency.    Musculoskeletal: Negative for arthralgias.  Skin: Negative for itching and rash.  Neurological: Negative for extremity weakness.  Hematological: Negative for adenopathy.  Psychiatric/Behavioral: Negative for confusion.    MEDICAL HISTORY:  Past Medical History:  Diagnosis Date  . Arthritis    osteoarthritis in hands, knees ( R torn cartilage, L meniscus removed)   . CAD (coronary artery disease)   . Diabetes mellitus without complication ([place])   . Glaucoma   . Hyperlipidemia   . Hypertension   . Hypothyroidism     SURGICAL HISTORY: Past Surgical History:  Procedure Laterality Date  . ABDOMINAL HYSTERECTOMY    . CORONARY ANGIOPLASTY WITH STENT PLACEMENT    . FOOT SURGERY Bilateral   . KNEE ARTHROSCOPY W/ MENISCECTOMY Left    Also has R knee injury (torn cartilage)     SOCIAL HISTORY: Social History   Socioeconomic History  . Marital status: Married    Spouse name: Not on file  . Number of children: Not on file  . Years of education: Not on file  . Highest education level: Not on file  Occupational History  . Not on file  Tobacco Use  . Smoking status: Former Smoker    Quit date: 01/21/1980    Years since quitting: 40.2  . Smokeless tobacco: Never Used  Substance and Sexual Activity  . Alcohol use: Yes    Alcohol/week: 0.0 standard drinks    Comment: occasionally  . Drug use: No  . Sexual activity: Never  Other Topics Concern  . Not on file  Social History Narrative  . Not on file   Social Determinants of Health   Financial Resource Strain:   . Difficulty of Paying Living Expenses:   Food Insecurity:   .  Worried About Charity fundraiser in the Last Year:   . Arboriculturist in the Last Year:   Transportation Needs:   . Film/video editor (Medical):   Marland Kitchen Lack of Transportation (Non-Medical):   Physical Activity:   . Days of Exercise per Week:   . Minutes of Exercise per Session:   Stress:   . Feeling of Stress :   Social Connections:   .  Frequency of Communication with Friends and Family:   . Frequency of Social Gatherings with Friends and Family:   . Attends Religious Services:   . Active Member of Clubs or Organizations:   . Attends Archivist Meetings:   Marland Kitchen Marital Status:   Intimate Partner Violence:   . Fear of Current or Ex-Partner:   . Emotionally Abused:   Marland Kitchen Physically Abused:   . Sexually Abused:     FAMILY HISTORY: Family History  Problem Relation Age of Onset  . Hypertension Mother   . Hypertension Father   . Early death Father   . Heart disease Father     ALLERGIES:  is allergic to atorvastatin, cyclobenzaprine, hydrocodone-acetaminophen, mirtazapine, oxycodone-acetaminophen, paroxetine hcl, penicillins, propoxyphene, ropinirole, and trazodone.  MEDICATIONS:  Current Outpatient Medications  Medication Sig Dispense Refill  . aspirin EC 81 MG tablet Take 1 tablet by mouth daily.    Marland Kitchen azelastine (ASTELIN) 0.1 % nasal spray Place 2 sprays into the nose.    . benazepril (LOTENSIN) 40 MG tablet Take 1 tablet (40 mg total) by mouth daily. 90 tablet 3  . cetirizine (ZYRTEC) 10 MG tablet Take 1 tablet by mouth daily.    . Cholecalciferol (VITAMIN D-1000 MAX ST) 1000 units tablet Take by mouth.    . clonazePAM (KLONOPIN) 0.5 MG tablet Take by mouth.    Marland Kitchen glimepiride (AMARYL) 2 MG tablet Take 0.5 tablets by mouth daily. Take 0.5 tablet daily with largest meal    . glucose blood test strip Use once daily. Use as instructed.    Marland Kitchen HYDROcodone-acetaminophen (NORCO/VICODIN) 5-325 MG tablet 1/2 po bid prn Earliest Fill Date: 07/19/17    . latanoprost (XALATAN) 0.005 % ophthalmic solution Apply to eye.    . levothyroxine (SYNTHROID, LEVOTHROID) 75 MCG tablet Take 88 tablets by mouth daily. Take on an empty stomach 30 to 60 minutes before breakfast.    . LOTREL 5-20 MG capsule     . meloxicam (MOBIC) 15 MG tablet TAKE 1 TABLET (15 MG TOTAL) BY MOUTH DAILY WITH BREAKFAST.  0  . metoprolol succinate  (TOPROL-XL) 50 MG 24 hr tablet Take 1 tablet by mouth daily.    . rosuvastatin (CRESTOR) 40 MG tablet Take 1 tablet by mouth daily.    . timolol (BETIMOL) 0.5 % ophthalmic solution Apply 1 drop to eye daily. Place one 1 drop into both eyes every morning.    . timolol (TIMOPTIC) 0.5 % ophthalmic solution INSTILL 1 DROP INTO EACH EYE EVERY MORNING AS DIRECTED  0  . tizanidine (ZANAFLEX) 2 MG capsule TAKE 1 CAPSULE (2 MG TOTAL) BY MOUTH 3 (THREE) TIMES DAILY AS NEEDED FOR MUSCLE SPASMS.  0  . triamcinolone cream (KENALOG) 0.1 % APPLY TO AFFECTED AREA TWICE A DAY AS NEEDED    . albuterol (PROAIR HFA) 108 (90 Base) MCG/ACT inhaler Inhale into the lungs. (Patient not taking: Reported on 03/15/2020)    . gabapentin (NEURONTIN) 300 MG capsule Take 1 capsule by mouth 3 (three) times daily.     . hydroxyurea (DROXIA)  300 MG capsule Take 1 capsule (300 mg total) by mouth daily. May take with food to minimize GI side effects. 30 capsule 0  . rOPINIRole (REQUIP) 0.5 MG tablet Take 1 tablet by mouth 2 (two) times daily.     No current facility-administered medications for this visit.     PHYSICAL EXAMINATION: ECOG PERFORMANCE STATUS: 1 - Symptomatic but completely ambulatory Vitals:   03/30/20 0920  BP: (!) 154/67  Pulse: 65  Resp: 18  Temp: 97.7 F (36.5 C)   Filed Weights   03/30/20 0920  Weight: 163 lb 14.4 oz (74.3 kg)    Physical Exam Constitutional:      General: She is not in acute distress.    Comments: Patient walks into clinic.  HENT:     Head: Normocephalic and atraumatic.  Eyes:     General: No scleral icterus. Cardiovascular:     Rate and Rhythm: Normal rate and regular rhythm.     Heart sounds: Normal heart sounds.  Pulmonary:     Effort: Pulmonary effort is normal. No respiratory distress.     Breath sounds: No wheezing.  Abdominal:     General: Bowel sounds are normal. There is no distension.     Palpations: Abdomen is soft.  Musculoskeletal:        General: No  deformity. Normal range of motion.     Cervical back: Normal range of motion and neck supple.  Skin:    General: Skin is warm and dry.     Findings: No erythema or rash.  Neurological:     Mental Status: She is alert and oriented to person, place, and time. Mental status is at baseline.     Cranial Nerves: No cranial nerve deficit.     Coordination: Coordination normal.  Psychiatric:        Mood and Affect: Mood normal.      LABORATORY DATA:  I have reviewed the data as listed Lab Results  Component Value Date   WBC 9.1 03/30/2020   HGB 16.0 (H) 03/30/2020   HCT 51.0 (H) 03/30/2020   MCV 88.5 03/30/2020   PLT 539 (H) 03/30/2020   Recent Labs    03/30/20 1026  NA 138  K 4.8  CL 103  CO2 27  GLUCOSE 128*  BUN 36*  CREATININE 1.98*  CALCIUM 8.7*  GFRNONAA 22*  GFRAA 25*  PROT 8.2*  ALBUMIN 3.6  AST 35  ALT 25  ALKPHOS 55  BILITOT 0.5   Iron/TIBC/Ferritin/ %Sat    Component Value Date/Time   IRON 58 02/18/2020 1529   TIBC 357 02/18/2020 1529   FERRITIN 47 02/18/2020 1529   IRONPCTSAT 16 02/18/2020 1529   IRONPCTSAT 18 (L) 01/21/2015 1207      RADIOGRAPHIC STUDIES: I have personally reviewed the radiological images as listed and agreed with the findings in the report. CT BONE MARROW BIOPSY & ASPIRATION  Result Date: 03/15/2020 CLINICAL DATA:  Thrombocytosis EXAM: CT GUIDED DEEP ILIAC BONE ASPIRATION AND CORE BIOPSY TECHNIQUE: Patient was placed prone on the CT gantry and limited axial scans through the pelvis were obtained. Appropriate skin entry site was identified. Skin site was marked, prepped with chlorhexidine, draped in usual sterile fashion, and infiltrated locally with 1% lidocaine. Intravenous Fentanyl 40mg and Versed 162mwere administered as conscious sedation during continuous monitoring of the patient's level of consciousness and physiological / cardiorespiratory status by the radiology RN, with a total moderate sedation time of 9 minutes. Under CT  fluoroscopic guidance  an Catherine trocar bone needle was advanced into the left iliac bone just lateral to the sacroiliac joint. Once needle tip position was confirmed, core and aspiration samples were obtained, submitted to pathology for approval. Post procedure scans show no hematoma or fracture. Patient tolerated procedure well. COMPLICATIONS: COMPLICATIONS none IMPRESSION: 1. Technically successful CT guided left iliac bone core and aspiration biopsy. Electronically Signed   By: Lucrezia Europe M.D.   On: 03/15/2020 13:06      ASSESSMENT & PLAN:  1. JAK-2 gene mutation   2. Erythrocytosis   3. Thrombocytosis ([place])   4. CKD (chronic kidney disease) stage 4, GFR 15-29 ml/min ([facility])    Labs reviewed and discussed with patient. Bone marrow biopsy results were reviewed and discussed. Patient has erythrocytosis and thrombocytosis, JAK2 V617 mutation, bone marrow showed no blast, hypercellular bone marrow with atypical megakaryocytic proliferation.  This is compatible with myeloproliferative neoplasm, ET versus polycythemia vera.  Cytogenetics showed normal karyotype. With both erythrocytosis and thrombocytosis, I think her clinical history is more compatible with polycythemia vera. Continue aspirin. I discussed with the rationale and potential side effects of hydroxyurea. Given patient's decreased kidney function due to CKD, I will start patient on low-dose hydroxyurea 300 mg daily. Patient will check CBC, CMP and erythropoietin level.  Follow-up in 4 weeks to evaluate tolerability and treatment response. All questions were answered. The patient knows to call the clinic with any problems questions or concerns.  Return of visit: 4 weeks  Earlie Server, MD, PhD 03/30/2020

## 2020-03-31 LAB — ERYTHROPOIETIN: Erythropoietin: 2 m[IU]/mL — ABNORMAL LOW (ref 2.6–18.5)

## 2020-04-14 ENCOUNTER — Inpatient Hospital Stay: Payer: Medicare Other | Admitting: Oncology

## 2020-04-14 ENCOUNTER — Inpatient Hospital Stay: Payer: Medicare Other

## 2020-04-23 ENCOUNTER — Other Ambulatory Visit: Payer: Self-pay | Admitting: Oncology

## 2020-04-23 NOTE — Telephone Encounter (Signed)
Will refill but contacted scheduler to reach out to patient to schedule follow up. No additional refills without visit with Dr. Tasia Catchings.

## 2020-04-27 ENCOUNTER — Inpatient Hospital Stay: Payer: Medicare Other | Attending: Oncology

## 2020-04-27 ENCOUNTER — Inpatient Hospital Stay: Payer: Medicare Other | Admitting: Oncology

## 2020-04-27 ENCOUNTER — Telehealth: Payer: Self-pay | Admitting: *Deleted

## 2020-04-27 NOTE — Telephone Encounter (Signed)
Called and left a message on pts vmail in reference to getting her 04/27/20 lab/MD No Show appts R/S for a day and time that best works for her. She was asked to contact the office to have appt R/S

## 2020-05-18 ENCOUNTER — Ambulatory Visit
Admission: RE | Admit: 2020-05-18 | Discharge: 2020-05-18 | Disposition: A | Discharge status: Home / Self Care | Payer: Medicare Other | Source: Ambulatory Visit | Attending: Physician Assistant | Admitting: Physician Assistant

## 2020-05-18 ENCOUNTER — Other Ambulatory Visit: Payer: Self-pay | Admitting: Physician Assistant

## 2020-05-18 ENCOUNTER — Other Ambulatory Visit: Payer: Self-pay

## 2020-05-18 DIAGNOSIS — M7989 Other specified soft tissue disorders: Secondary | ICD-10-CM | POA: Diagnosis not present

## 2020-06-02 DIAGNOSIS — D45 Polycythemia vera: Secondary | ICD-10-CM | POA: Diagnosis present

## 2020-09-20 ENCOUNTER — Other Ambulatory Visit: Payer: Self-pay | Admitting: Nephrology

## 2020-09-20 DIAGNOSIS — N184 Chronic kidney disease, stage 4 (severe): Secondary | ICD-10-CM

## 2020-09-29 ENCOUNTER — Ambulatory Visit
Admission: RE | Admit: 2020-09-29 | Discharge: 2020-09-29 | Disposition: A | Discharge status: Home / Self Care | Payer: Medicare Other | Source: Ambulatory Visit | Attending: Nephrology | Admitting: Nephrology

## 2020-09-29 ENCOUNTER — Other Ambulatory Visit: Payer: Self-pay

## 2020-09-29 DIAGNOSIS — N184 Chronic kidney disease, stage 4 (severe): Secondary | ICD-10-CM | POA: Diagnosis not present

## 2020-10-04 DIAGNOSIS — I700 Atherosclerosis of aorta: Secondary | ICD-10-CM | POA: Insufficient documentation

## 2020-10-05 ENCOUNTER — Ambulatory Visit: Payer: Medicare Other

## 2020-12-02 DIAGNOSIS — E039 Hypothyroidism, unspecified: Secondary | ICD-10-CM | POA: Diagnosis present

## 2020-12-02 DIAGNOSIS — E782 Mixed hyperlipidemia: Secondary | ICD-10-CM | POA: Insufficient documentation

## 2021-04-07 DIAGNOSIS — N184 Chronic kidney disease, stage 4 (severe): Secondary | ICD-10-CM | POA: Diagnosis present

## 2021-04-18 ENCOUNTER — Other Ambulatory Visit: Payer: Self-pay

## 2021-04-18 ENCOUNTER — Encounter: Payer: Self-pay | Admitting: *Deleted

## 2021-04-18 ENCOUNTER — Emergency Department: Payer: Medicare Other

## 2021-04-18 ENCOUNTER — Emergency Department
Admission: EM | Admit: 2021-04-18 | Discharge: 2021-04-18 | Disposition: A | Discharge status: Home / Self Care | Payer: Medicare Other | Attending: Emergency Medicine | Admitting: Emergency Medicine

## 2021-04-18 DIAGNOSIS — I10 Essential (primary) hypertension: Secondary | ICD-10-CM | POA: Diagnosis not present

## 2021-04-18 DIAGNOSIS — R030 Elevated blood-pressure reading, without diagnosis of hypertension: Secondary | ICD-10-CM | POA: Diagnosis present

## 2021-04-18 DIAGNOSIS — Z87891 Personal history of nicotine dependence: Secondary | ICD-10-CM | POA: Diagnosis not present

## 2021-04-18 DIAGNOSIS — I2510 Atherosclerotic heart disease of native coronary artery without angina pectoris: Secondary | ICD-10-CM | POA: Insufficient documentation

## 2021-04-18 DIAGNOSIS — E1142 Type 2 diabetes mellitus with diabetic polyneuropathy: Secondary | ICD-10-CM | POA: Diagnosis not present

## 2021-04-18 DIAGNOSIS — Z79899 Other long term (current) drug therapy: Secondary | ICD-10-CM | POA: Insufficient documentation

## 2021-04-18 DIAGNOSIS — Z7982 Long term (current) use of aspirin: Secondary | ICD-10-CM | POA: Diagnosis not present

## 2021-04-18 DIAGNOSIS — M545 Low back pain, unspecified: Secondary | ICD-10-CM | POA: Diagnosis not present

## 2021-04-18 DIAGNOSIS — G8929 Other chronic pain: Secondary | ICD-10-CM | POA: Diagnosis not present

## 2021-04-18 DIAGNOSIS — M79604 Pain in right leg: Secondary | ICD-10-CM | POA: Insufficient documentation

## 2021-04-18 DIAGNOSIS — Z7984 Long term (current) use of oral hypoglycemic drugs: Secondary | ICD-10-CM | POA: Diagnosis not present

## 2021-04-18 DIAGNOSIS — E039 Hypothyroidism, unspecified: Secondary | ICD-10-CM | POA: Diagnosis not present

## 2021-04-18 LAB — CBC
HCT: 52.7 % — ABNORMAL HIGH (ref 36.0–46.0)
Hemoglobin: 16.8 g/dL — ABNORMAL HIGH (ref 12.0–15.0)
MCH: 30.7 pg (ref 26.0–34.0)
MCHC: 31.9 g/dL (ref 30.0–36.0)
MCV: 96.3 fL (ref 80.0–100.0)
Platelets: 580 10*3/uL — ABNORMAL HIGH (ref 150–400)
RBC: 5.47 MIL/uL — ABNORMAL HIGH (ref 3.87–5.11)
RDW: 15.6 % — ABNORMAL HIGH (ref 11.5–15.5)
WBC: 16.2 10*3/uL — ABNORMAL HIGH (ref 4.0–10.5)
nRBC: 0 % (ref 0.0–0.2)

## 2021-04-18 LAB — BASIC METABOLIC PANEL
Anion gap: 6 (ref 5–15)
BUN: 36 mg/dL — ABNORMAL HIGH (ref 8–23)
CO2: 22 mmol/L (ref 22–32)
Calcium: 8.8 mg/dL — ABNORMAL LOW (ref 8.9–10.3)
Chloride: 107 mmol/L (ref 98–111)
Creatinine, Ser: 2.25 mg/dL — ABNORMAL HIGH (ref 0.44–1.00)
GFR, Estimated: 20 mL/min — ABNORMAL LOW (ref >60)
Glucose, Bld: 101 mg/dL — ABNORMAL HIGH (ref 70–99)
Potassium: 4.7 mmol/L (ref 3.5–5.1)
Sodium: 135 mmol/L (ref 135–145)

## 2021-04-18 LAB — TROPONIN I (HIGH SENSITIVITY): Troponin I (High Sensitivity): 8 ng/L (ref <18)

## 2021-04-18 MED ORDER — CLONIDINE HCL 0.1 MG PO TABS: 0.1000 mg | ORAL_TABLET | Freq: Every day | ORAL | 0 refills | Status: DC | PRN

## 2021-04-18 MED ORDER — CLONIDINE HCL 0.1 MG PO TABS
0.1000 mg | ORAL_TABLET | Freq: Once | ORAL | Status: AC
  Administered 2021-04-18: 0.1 mg via ORAL
  Filled 2021-04-18: qty 1

## 2021-04-18 NOTE — ED Triage Notes (Signed)
Pt came via ems from home.  Pt has sciatica and has pain in right back area.  Did not sleep well last night due to pain.  Home health nurse came to house today and reported elevated blood pressure.  No chest pain or sob.  Pt alert  speech clear.  Pt took bp meds today.

## 2021-04-18 NOTE — ED Provider Notes (Signed)
[facility] Emergency Department Provider Note  Time seen: 6:30 PM  I have reviewed the triage vital signs and the nursing notes.   HISTORY  Chief Complaint Back Pain   HPI Cassandra [name] is a 87 y.o. female with a past medical history arthritis, CAD, diabetes, hypertension, hyperlipidemia, chronic back pain, presents to the emergency department for an elevated blood pressure.  According to the patient's she has chronic back pain, follows up with her back specialist in fact has an appointment tomorrow for injections.  States that home health nurse came to the house today as her husband has dementia, but checked her blood pressure as well noted to be elevated to 644 systolic informed her to come to the emergency department.  Patient denies any symptoms such as headache weakness or numbness.  Does state back pain with radiation to the right leg which she states is her baseline and chronic back pain.  Patient takes hydrocodone for this back pain which she states helps.  Denies any fever.  Largely negative review of systems otherwise.   Past Medical History:  Diagnosis Date   Arthritis    osteoarthritis in hands, knees ( R torn cartilage, L meniscus removed)    CAD (coronary artery disease)    Diabetes mellitus without complication ([place])    Glaucoma    Hyperlipidemia    Hypertension    Hypothyroidism     Patient Active Problem List   Diagnosis Date Noted   Erythrocytosis 02/19/2020   Thrombocytosis 02/18/2020   Essential hypertension 01/24/2015   Type 2 DM with diabetic neuropathy affecting both sides of body ([place]) 01/24/2015   Restless legs syndrome 01/24/2015   Chronic constipation 01/24/2015    Past Surgical History:  Procedure Laterality Date   ABDOMINAL HYSTERECTOMY     CORONARY ANGIOPLASTY WITH STENT PLACEMENT     FOOT SURGERY Bilateral    KNEE ARTHROSCOPY W/ MENISCECTOMY Left    Also has R knee injury (torn cartilage)     Prior to Admission  medications   Medication Sig Start Date End Date Taking? Authorizing Provider  albuterol (PROAIR HFA) 108 (90 Base) MCG/ACT inhaler Inhale into the lungs. Patient not taking: Reported on 03/15/2020 03/20/17   [provider]  aspirin EC 81 MG tablet Take 1 tablet by mouth daily.    [provider]  azelastine (ASTELIN) 0.1 % nasal spray Place 2 sprays into the nose.    [provider]  benazepril (LOTENSIN) 40 MG tablet Take 1 tablet (40 mg total) by mouth daily. 01/21/15   Crecencio Mc, MD  cetirizine (ZYRTEC) 10 MG tablet Take 1 tablet by mouth daily.    [provider]  Cholecalciferol (VITAMIN D-1000 MAX ST) 1000 units tablet Take by mouth. 04/26/17   [provider]  clonazePAM (KLONOPIN) 0.5 MG tablet Take by mouth. 02/14/17   [provider]  DROXIA 300 MG capsule TAKE 1 CAPSULE (300 MG TOTAL) BY MOUTH DAILY. MAY TAKE WITH FOOD TO MINIMIZE GI SIDE EFFECTS. 04/23/20   Verlon Au, NP  gabapentin (NEURONTIN) 300 MG capsule Take 1 capsule by mouth 3 (three) times daily.  01/05/15 03/15/20  [provider]  glimepiride (AMARYL) 2 MG tablet Take 0.5 tablets by mouth daily. Take 0.5 tablet daily with largest meal 12/08/14   [provider]  glucose blood test strip Use once daily. Use as instructed. 02/21/17   [provider]  HYDROcodone-acetaminophen (NORCO/VICODIN) 5-325 MG tablet 1/2 po bid prn Earliest Fill  Date: 07/19/17 07/19/17   [provider]  latanoprost (XALATAN) 0.005 % ophthalmic solution Apply to eye. 09/13/15   [provider]  levothyroxine (SYNTHROID, LEVOTHROID) 75 MCG tablet Take 88 tablets by mouth daily. Take on an empty stomach 30 to 60 minutes before breakfast. 12/08/14   [provider]  LOTREL 5-20 MG capsule  07/03/17   [provider]  meloxicam (MOBIC) 15 MG tablet TAKE 1 TABLET (15 MG TOTAL) BY MOUTH DAILY WITH BREAKFAST. 06/05/17   [provider]   metoprolol succinate (TOPROL-XL) 50 MG 24 hr tablet Take 1 tablet by mouth daily. 11/02/14   [provider]  rOPINIRole (REQUIP) 0.5 MG tablet Take 1 tablet by mouth 2 (two) times daily. 01/05/15 03/15/20  [provider]  rosuvastatin (CRESTOR) 40 MG tablet Take 1 tablet by mouth daily. 01/07/15   [provider]  timolol (BETIMOL) 0.5 % ophthalmic solution Apply 1 drop to eye daily. Place one 1 drop into both eyes every morning.    [provider]  timolol (TIMOPTIC) 0.5 % ophthalmic solution INSTILL 1 DROP INTO EACH EYE EVERY MORNING AS DIRECTED 06/22/17   [provider]  tizanidine (ZANAFLEX) 2 MG capsule TAKE 1 CAPSULE (2 MG TOTAL) BY MOUTH 3 (THREE) TIMES DAILY AS NEEDED FOR MUSCLE SPASMS. 05/21/17   [provider]  triamcinolone cream (KENALOG) 0.1 % APPLY TO AFFECTED AREA TWICE A DAY AS NEEDED 01/01/15   [provider]    Allergies  Allergen Reactions   Atorvastatin Other (See Comments)    MYALGIA   Cyclobenzaprine Other (See Comments)   Hydrocodone-Acetaminophen     Other reaction(s): Hallucination when taking whole pill, pt tolerates taking 1/2 pill   Mirtazapine     Other reaction(s): Hallucination   Oxycodone-Acetaminophen Other (See Comments)   Paroxetine Hcl Other (See Comments)   Penicillins Swelling    Lip and orbital swelling   Propoxyphene Other (See Comments)   Ropinirole     Other reaction(s): Hallucination   Trazodone Other (See Comments)    Family History  Problem Relation Age of Onset   Hypertension Mother    Hypertension Father    Early death Father    Heart disease Father     Social History Social History   Tobacco Use   Smoking status: Former    Types: Cigarettes    Quit date: 01/21/1980    Years since quitting: 41.2   Smokeless tobacco: Never  Substance Use Topics   Alcohol use: Yes    Alcohol/week: 0.0 standard drinks    Comment: occasionally   Drug use: No    Review of  Systems Constitutional: Negative for fever. Cardiovascular: Negative for chest pain. Respiratory: Negative for shortness of breath. Gastrointestinal: Negative for abdominal pain, vomiting and diarrhea. Genitourinary: Negative for urinary compaints Musculoskeletal: Positive for back pain, which she states is chronic mostly in the right lower back radiating to the right leg at times. Skin: Negative for skin complaints  Neurological: Negative for headache All other ROS negative  ____________________________________________   PHYSICAL EXAM:  VITAL SIGNS: ED Triage Vitals [04/18/21 1519]  Enc Vitals Group     BP (!) 165/94     Pulse Rate 70     Resp 20     Temp 98.4 F (36.9 C)     Temp Source Oral     SpO2 94 %     Weight 158 lb (71.7 kg)     Height 5' (1.524 m)  Head Circumference      Peak Flow      Pain Score 7     Pain Loc      Pain Edu?      Excl. in [place]?    Constitutional: Alert and oriented. Well appearing and in no distress. Eyes: Normal exam ENT      Head: Normocephalic and atraumatic.      Mouth/Throat: Mucous membranes are moist. Cardiovascular: Normal rate, regular rhythm. Respiratory: Normal respiratory effort without tachypnea nor retractions. Breath sounds are clear  Gastrointestinal: Soft and nontender. No distention.  Musculoskeletal: Mild tenderness to palpation over the right SI joint, no other CT or L-spine tenderness. Neurologic:  Normal speech and language. No gross focal neurologic deficits  Skin:  Skin is warm, dry and intact.  Psychiatric: Mood and affect are normal.   ____________________________________________    EKG  EKG viewed and interpreted by myself shows a normal sinus rhythm at 65 bpm with a narrow QRS, left axis deviation, largely normal intervals and nonspecific ST changes.  ____________________________________________   INITIAL IMPRESSION / ASSESSMENT AND PLAN / ED COURSE  Pertinent labs & imaging results that were  available during my care of the patient were reviewed by me and considered in my medical decision making (see chart for details).   Patient presents emergency department for right lower back pain radiating to the right leg at times which she states is chronic but had an elevated blood pressure which is the reason for presentation today.  Patient's lab work does show kidney function largely unchanged from a year ago, follows up with nephrology.  Patient does have an elevated white blood cell count of 16,000, states she is aware of this and Dr. Sabra Heck her PCP referred her to a hematologist.  Patient denies any infectious symptoms.  However given her back pain and elevated white blood cell count I did recommend a CT scan to further evaluate.  Patient states the back pain is chronic and unchanged from her chronic baseline.  States she is aware of the elevated white blood cell count and is following up with a hematologist and does not want any further CT imaging.  I believe this is reasonable.  Patient continues to have elevated blood pressure currently 215/79.  We will dose clonidine and recheck.  Patient states she will follow-up with Dr. Sabra Heck tomorrow regarding her blood pressure.  Blood pressure is decreasing.  Patient states she is ready to go home.  I believe this is reasonable, patient will follow up with her PCP.  We will prescribe the patient a prescription for clonidine to be used only if her systolic is over 053 Patient agreeable to plan of care.  Jakyra Kenealy was evaluated in Emergency Department on 04/18/2021 for the symptoms described in the history of present illness. She was evaluated in the context of the global COVID-19 pandemic, which necessitated consideration that the patient might be at risk for infection with the SARS-CoV-2 virus that causes COVID-19. Institutional protocols and algorithms that pertain to the evaluation of patients at risk for COVID-19 are in a state of rapid change based  on information released by regulatory bodies including the CDC and federal and state organizations. These policies and algorithms were followed during the patient's care in the ED.  ____________________________________________   FINAL CLINICAL IMPRESSION(S) / ED DIAGNOSES  Hypertension Chronic back pain   Harvest Dark, MD 04/18/21 1913

## 2021-04-27 ENCOUNTER — Other Ambulatory Visit: Payer: Self-pay | Admitting: Physician Assistant

## 2021-04-27 ENCOUNTER — Other Ambulatory Visit: Payer: Self-pay

## 2021-04-27 ENCOUNTER — Ambulatory Visit
Admission: RE | Admit: 2021-04-27 | Discharge: 2021-04-27 | Disposition: A | Discharge status: Home / Self Care | Payer: Medicare Other | Source: Ambulatory Visit | Attending: Physician Assistant | Admitting: Physician Assistant

## 2021-04-27 DIAGNOSIS — M25062 Hemarthrosis, left knee: Secondary | ICD-10-CM | POA: Insufficient documentation

## 2021-04-27 DIAGNOSIS — Y92009 Unspecified place in unspecified non-institutional (private) residence as the place of occurrence of the external cause: Secondary | ICD-10-CM

## 2021-04-27 DIAGNOSIS — M25462 Effusion, left knee: Secondary | ICD-10-CM | POA: Diagnosis not present

## 2021-04-27 DIAGNOSIS — M7122 Synovial cyst of popliteal space [Baker], left knee: Secondary | ICD-10-CM | POA: Insufficient documentation

## 2021-04-27 DIAGNOSIS — W19XXXA Unspecified fall, initial encounter: Secondary | ICD-10-CM | POA: Diagnosis not present

## 2021-04-27 DIAGNOSIS — M25562 Pain in left knee: Secondary | ICD-10-CM | POA: Diagnosis not present

## 2021-05-14 ENCOUNTER — Emergency Department: Payer: Medicare Other

## 2021-05-14 ENCOUNTER — Inpatient Hospital Stay: Payer: Medicare Other

## 2021-05-14 ENCOUNTER — Other Ambulatory Visit: Payer: Self-pay

## 2021-05-14 ENCOUNTER — Observation Stay
Admission: EM | Admit: 2021-05-14 | Discharge: 2021-05-15 | Disposition: A | Discharge status: Home / Self Care | Payer: Medicare Other | Attending: Obstetrics and Gynecology | Admitting: Obstetrics and Gynecology

## 2021-05-14 DIAGNOSIS — E039 Hypothyroidism, unspecified: Secondary | ICD-10-CM | POA: Diagnosis present

## 2021-05-14 DIAGNOSIS — R4182 Altered mental status, unspecified: Secondary | ICD-10-CM | POA: Diagnosis present

## 2021-05-14 DIAGNOSIS — Z7984 Long term (current) use of oral hypoglycemic drugs: Secondary | ICD-10-CM | POA: Insufficient documentation

## 2021-05-14 DIAGNOSIS — Z87891 Personal history of nicotine dependence: Secondary | ICD-10-CM | POA: Diagnosis not present

## 2021-05-14 DIAGNOSIS — E119 Type 2 diabetes mellitus without complications: Secondary | ICD-10-CM | POA: Diagnosis present

## 2021-05-14 DIAGNOSIS — G9341 Metabolic encephalopathy: Secondary | ICD-10-CM

## 2021-05-14 DIAGNOSIS — D45 Polycythemia vera: Secondary | ICD-10-CM | POA: Diagnosis present

## 2021-05-14 DIAGNOSIS — I2510 Atherosclerotic heart disease of native coronary artery without angina pectoris: Secondary | ICD-10-CM | POA: Diagnosis present

## 2021-05-14 DIAGNOSIS — G459 Transient cerebral ischemic attack, unspecified: Secondary | ICD-10-CM

## 2021-05-14 DIAGNOSIS — Z7982 Long term (current) use of aspirin: Secondary | ICD-10-CM | POA: Diagnosis not present

## 2021-05-14 DIAGNOSIS — R0902 Hypoxemia: Secondary | ICD-10-CM | POA: Diagnosis present

## 2021-05-14 DIAGNOSIS — Z955 Presence of coronary angioplasty implant and graft: Secondary | ICD-10-CM | POA: Insufficient documentation

## 2021-05-14 DIAGNOSIS — Z79899 Other long term (current) drug therapy: Secondary | ICD-10-CM | POA: Insufficient documentation

## 2021-05-14 DIAGNOSIS — I160 Hypertensive urgency: Principal | ICD-10-CM

## 2021-05-14 DIAGNOSIS — Z20822 Contact with and (suspected) exposure to covid-19: Secondary | ICD-10-CM | POA: Diagnosis not present

## 2021-05-14 DIAGNOSIS — I129 Hypertensive chronic kidney disease with stage 1 through stage 4 chronic kidney disease, or unspecified chronic kidney disease: Secondary | ICD-10-CM | POA: Diagnosis not present

## 2021-05-14 DIAGNOSIS — E1142 Type 2 diabetes mellitus with diabetic polyneuropathy: Secondary | ICD-10-CM | POA: Diagnosis present

## 2021-05-14 DIAGNOSIS — E1122 Type 2 diabetes mellitus with diabetic chronic kidney disease: Secondary | ICD-10-CM | POA: Diagnosis not present

## 2021-05-14 DIAGNOSIS — N184 Chronic kidney disease, stage 4 (severe): Secondary | ICD-10-CM | POA: Diagnosis present

## 2021-05-14 DIAGNOSIS — S82142D Displaced bicondylar fracture of left tibia, subsequent encounter for closed fracture with routine healing: Secondary | ICD-10-CM

## 2021-05-14 LAB — BASIC METABOLIC PANEL
Anion gap: 8 (ref 5–15)
BUN: 26 mg/dL — ABNORMAL HIGH (ref 8–23)
CO2: 22 mmol/L (ref 22–32)
Calcium: 9.3 mg/dL (ref 8.9–10.3)
Chloride: 110 mmol/L (ref 98–111)
Creatinine, Ser: 2 mg/dL — ABNORMAL HIGH (ref 0.44–1.00)
GFR, Estimated: 23 mL/min — ABNORMAL LOW (ref >60)
Glucose, Bld: 101 mg/dL — ABNORMAL HIGH (ref 70–99)
Potassium: 5.6 mmol/L — ABNORMAL HIGH (ref 3.5–5.1)
Sodium: 140 mmol/L (ref 135–145)

## 2021-05-14 LAB — CBC WITH DIFFERENTIAL/PLATELET
Abs Immature Granulocytes: 0.07 10*3/uL (ref 0.00–0.07)
Basophils Absolute: 0.1 10*3/uL (ref 0.0–0.1)
Basophils Relative: 1 %
Eosinophils Absolute: 0.2 10*3/uL (ref 0.0–0.5)
Eosinophils Relative: 2 %
HCT: 56 % — ABNORMAL HIGH (ref 36.0–46.0)
Hemoglobin: 17.4 g/dL — ABNORMAL HIGH (ref 12.0–15.0)
Immature Granulocytes: 1 %
Lymphocytes Relative: 19 %
Lymphs Abs: 2.1 10*3/uL (ref 0.7–4.0)
MCH: 29.3 pg (ref 26.0–34.0)
MCHC: 31.1 g/dL (ref 30.0–36.0)
MCV: 94.4 fL (ref 80.0–100.0)
Monocytes Absolute: 0.9 10*3/uL (ref 0.1–1.0)
Monocytes Relative: 8 %
Neutro Abs: 7.7 10*3/uL (ref 1.7–7.7)
Neutrophils Relative %: 69 %
Platelets: 572 10*3/uL — ABNORMAL HIGH (ref 150–400)
RBC: 5.93 MIL/uL — ABNORMAL HIGH (ref 3.87–5.11)
RDW: 16.4 % — ABNORMAL HIGH (ref 11.5–15.5)
WBC: 11 10*3/uL — ABNORMAL HIGH (ref 4.0–10.5)
nRBC: 0 % (ref 0.0–0.2)

## 2021-05-14 LAB — URINALYSIS, COMPLETE (UACMP) WITH MICROSCOPIC
Bilirubin Urine: NEGATIVE
Glucose, UA: 100 mg/dL — AB
Ketones, ur: NEGATIVE mg/dL
Leukocytes,Ua: NEGATIVE
Nitrite: NEGATIVE
Protein, ur: >300 mg/dL — AB
Specific Gravity, Urine: 1.025 (ref 1.005–1.030)
pH: 6 (ref 5.0–8.0)

## 2021-05-14 LAB — TROPONIN I (HIGH SENSITIVITY)
Troponin I (High Sensitivity): 9 ng/L (ref <18)
Troponin I (High Sensitivity): 10 ng/L (ref <18)

## 2021-05-14 LAB — RESP PANEL BY RT-PCR (FLU A&B, COVID) ARPGX2
Influenza A by PCR: NEGATIVE
Influenza B by PCR: NEGATIVE
SARS Coronavirus 2 by RT PCR: NEGATIVE

## 2021-05-14 MED ORDER — STROKE: EARLY STAGES OF RECOVERY BOOK: Freq: Once | Status: DC

## 2021-05-14 MED ORDER — ENOXAPARIN SODIUM 30 MG/0.3ML IJ SOSY
30.0000 mg | PREFILLED_SYRINGE | INTRAMUSCULAR | Status: DC
  Administered 2021-05-15: 30 mg via SUBCUTANEOUS
  Filled 2021-05-14: qty 0.3

## 2021-05-14 MED ORDER — INSULIN ASPART 100 UNIT/ML IJ SOLN: 0.0000 [IU] | Freq: Every day | INTRAMUSCULAR | Status: DC

## 2021-05-14 MED ORDER — SENNOSIDES-DOCUSATE SODIUM 8.6-50 MG PO TABS: 1.0000 | ORAL_TABLET | Freq: Every evening | ORAL | Status: DC | PRN

## 2021-05-14 MED ORDER — ACETAMINOPHEN 325 MG PO TABS: 650.0000 mg | ORAL_TABLET | ORAL | Status: DC | PRN

## 2021-05-14 MED ORDER — INSULIN ASPART 100 UNIT/ML IJ SOLN: 0.0000 [IU] | Freq: Three times a day (TID) | INTRAMUSCULAR | Status: DC

## 2021-05-14 MED ORDER — ACETAMINOPHEN 650 MG RE SUPP: 650.0000 mg | Freq: Four times a day (QID) | RECTAL | Status: DC | PRN

## 2021-05-14 MED ORDER — ACETAMINOPHEN 650 MG RE SUPP: 650.0000 mg | RECTAL | Status: DC | PRN

## 2021-05-14 MED ORDER — SODIUM CHLORIDE 0.9 % IV SOLN: INTRAVENOUS | Status: DC

## 2021-05-14 MED ORDER — ONDANSETRON HCL 4 MG PO TABS: 4.0000 mg | ORAL_TABLET | Freq: Four times a day (QID) | ORAL | Status: DC | PRN

## 2021-05-14 MED ORDER — ONDANSETRON HCL 4 MG/2ML IJ SOLN: 4.0000 mg | Freq: Four times a day (QID) | INTRAMUSCULAR | Status: DC | PRN

## 2021-05-14 MED ORDER — ACETAMINOPHEN 325 MG PO TABS: 650.0000 mg | ORAL_TABLET | Freq: Four times a day (QID) | ORAL | Status: DC | PRN

## 2021-05-14 MED ORDER — ACETAMINOPHEN 160 MG/5ML PO SOLN
650.0000 mg | ORAL | Status: DC | PRN
  Filled 2021-05-14: qty 20.3

## 2021-05-14 NOTE — ED Notes (Signed)
Oxygen noted to drop to 88-89 while talking, placed on 2L 97%

## 2021-05-14 NOTE — H&P (Signed)
History and Physical    Cassandra [name] LGX:[id number] DOB: November 11, 1926 DOA: 05/14/2021  PCP: Rusty Aus, MD   Patient coming from: Home  I have personally briefly reviewed patient's old medical records in [place]  Chief Complaint: altered mental status  HPI: Cassandra [name] is a 87 y.o. female with medical history significant for CAD, HTN, CKD 4, polycythemia vera, thrombocytosis, hypothyroidism, diabetes with neuropathy, s/p left tibial plateau fracture on 7/28 treated non surgically, who was brought in by EMS after she was found difficult to arouse at home and appeared confused on arising, returning to baseline by arrival in the ED.  She was previously in her usual state of health and denies recent cough, shortness of breath, fever or chills.  Has had no nausea, vomiting, abdominal pain or diarrhea or dysuria.  Patient states that she was well when she was trying to be awoken by her husband.  She believes her symptoms might be attributed to mistakenly taking an extra dose of gabapentin  ED course: On arrival BP 222/90, pulse 66 with O2 sat initially 98% on room air but then falling to 86 to 88% requiring O2 at 2 L Blood work significant for WBC 11,000, hemoglobin 17, platelets 572, creatinine of 2 which is around her baseline, potassium 5.6.  Troponin 9-10.  Urinalysis with proteinuria and rare bacteria.  COVID and flu negative.  EKG, personally viewed and interpreted: Sinus rhythm at 70 with no acute ST-T wave changes  Imaging: Chest x-ray with no active disease CT head no acute intracranial abnormalities  Hospitalist consulted for admission    Review of Systems: As per HPI otherwise all other systems on review of systems negative.    Past Medical History:  Diagnosis Date   Arthritis    osteoarthritis in hands, knees ( R torn cartilage, L meniscus removed)    CAD (coronary artery disease)    Diabetes mellitus without complication (HCC)    Glaucoma     Hyperlipidemia    Hypertension    Hypothyroidism     Past Surgical History:  Procedure Laterality Date   ABDOMINAL HYSTERECTOMY     CORONARY ANGIOPLASTY WITH STENT PLACEMENT     FOOT SURGERY Bilateral    KNEE ARTHROSCOPY W/ MENISCECTOMY Left    Also has R knee injury (torn cartilage)      reports that she quit smoking about 41 years ago. She has never used smokeless tobacco. She reports current alcohol use. She reports that she does not use drugs.  Allergies  Allergen Reactions   Atorvastatin Other (See Comments)    MYALGIA   Cyclobenzaprine Other (See Comments)   Hydrocodone-Acetaminophen     Other reaction(s): Hallucination when taking whole pill, pt tolerates taking 1/2 pill   Mirtazapine     Other reaction(s): Hallucination   Oxycodone-Acetaminophen Other (See Comments)   Paroxetine Hcl Other (See Comments)   Penicillins Swelling    Lip and orbital swelling   Propoxyphene Other (See Comments)   Ropinirole     Other reaction(s): Hallucination   Trazodone Other (See Comments)    Family History  Problem Relation Age of Onset   Hypertension Mother    Hypertension Father    Early death Father    Heart disease Father       Prior to Admission medications   Medication Sig Start Date End Date Taking? Authorizing Provider  albuterol (PROAIR HFA) 108 (90 Base) MCG/ACT inhaler Inhale into the lungs. Patient not taking: Reported on 03/15/2020 03/20/17  [provider]  aspirin EC 81 MG tablet Take 1 tablet by mouth daily.    [provider]  azelastine (ASTELIN) 0.1 % nasal spray Place 2 sprays into the nose.    [provider]  benazepril (LOTENSIN) 40 MG tablet Take 1 tablet (40 mg total) by mouth daily. 01/21/15   Crecencio Mc, MD  cetirizine (ZYRTEC) 10 MG tablet Take 1 tablet by mouth daily.    [provider]  Cholecalciferol (VITAMIN D-1000 MAX ST) 1000 units tablet Take by mouth. 04/26/17   [provider]  clonazePAM  (KLONOPIN) 0.5 MG tablet Take by mouth. 02/14/17   [provider]  cloNIDine (CATAPRES) 0.1 MG tablet Take 1 tablet (0.1 mg total) by mouth daily as needed (Systolic blood pressure 737 or higher). 04/18/21 04/18/22  Harvest Dark, MD  DROXIA 300 MG capsule TAKE 1 CAPSULE (300 MG TOTAL) BY MOUTH DAILY. MAY TAKE WITH FOOD TO MINIMIZE GI SIDE EFFECTS. 04/23/20   Verlon Au, NP  gabapentin (NEURONTIN) 300 MG capsule Take 1 capsule by mouth 3 (three) times daily.  01/05/15 03/15/20  [provider]  glimepiride (AMARYL) 2 MG tablet Take 0.5 tablets by mouth daily. Take 0.5 tablet daily with largest meal 12/08/14   [provider]  glucose blood test strip Use once daily. Use as instructed. 02/21/17   [provider]  HYDROcodone-acetaminophen (NORCO/VICODIN) 5-325 MG tablet 1/2 po bid prn Earliest Fill Date: 07/19/17 07/19/17   [provider]  latanoprost (XALATAN) 0.005 % ophthalmic solution Apply to eye. 09/13/15   [provider]  levothyroxine (SYNTHROID, LEVOTHROID) 75 MCG tablet Take 88 tablets by mouth daily. Take on an empty stomach 30 to 60 minutes before breakfast. 12/08/14   [provider]  LOTREL 5-20 MG capsule  07/03/17   [provider]  meloxicam (MOBIC) 15 MG tablet TAKE 1 TABLET (15 MG TOTAL) BY MOUTH DAILY WITH BREAKFAST. 06/05/17   [provider]  metoprolol succinate (TOPROL-XL) 50 MG 24 hr tablet Take 1 tablet by mouth daily. 11/02/14   [provider]  rOPINIRole (REQUIP) 0.5 MG tablet Take 1 tablet by mouth 2 (two) times daily. 01/05/15 03/15/20  [provider]  rosuvastatin (CRESTOR) 40 MG tablet Take 1 tablet by mouth daily. 01/07/15   [provider]  timolol (BETIMOL) 0.5 % ophthalmic solution Apply 1 drop to eye daily. Place one 1 drop into both eyes every morning.    [provider]  timolol (TIMOPTIC) 0.5 % ophthalmic solution INSTILL 1 DROP INTO EACH EYE EVERY  MORNING AS DIRECTED 06/22/17   [provider]  tizanidine (ZANAFLEX) 2 MG capsule TAKE 1 CAPSULE (2 MG TOTAL) BY MOUTH 3 (THREE) TIMES DAILY AS NEEDED FOR MUSCLE SPASMS. 05/21/17   [provider]  triamcinolone cream (KENALOG) 0.1 % APPLY TO AFFECTED AREA TWICE A DAY AS NEEDED 01/01/15   [provider]    Physical Exam: Vitals:   05/14/21 1828 05/14/21 1915 05/14/21 2015 05/14/21 2056  BP:  (!) 185/71 (!) 174/72 (!) 158/67  Pulse:  65 63 (!) 57  Resp:  16 16 16   Temp:      TempSrc:      SpO2:  99% 98% 95%  Weight: 72 kg     Height: 5' (1.524 m)        Vitals:   05/14/21 1828 05/14/21 1915 05/14/21 2015 05/14/21 2056  BP:  (!) 185/71 (!) 174/72 (!) 158/67  Pulse:  65 63 (!)  57  Resp:  16 16 16   Temp:      TempSrc:      SpO2:  99% 98% 95%  Weight: 72 kg     Height: 5' (1.524 m)         Constitutional: Alert and oriented x 3 . Not in any apparent distress HEENT:      Head: Normocephalic and atraumatic.         Eyes: PERLA, EOMI, Conjunctivae are normal. Sclera is non-icteric.       Mouth/Throat: Mucous membranes are moist.       Neck: Supple with no signs of meningismus. Cardiovascular: Regular rate and rhythm. No murmurs, gallops, or rubs. 2+ symmetrical distal pulses are present . No JVD. No LE edema Respiratory: Respiratory effort normal .Lungs sounds clear bilaterally. No wheezes, crackles, or rhonchi.  Gastrointestinal: Soft, non tender, and non distended with positive bowel sounds.  Genitourinary: No CVA tenderness. Musculoskeletal: Nontender with normal range of motion in all extremities. No cyanosis, or erythema of extremities. Neurologic:  Face is symmetric. Moving all extremities. No gross focal neurologic deficits . Skin: Skin is warm, dry.  No rash or ulcers Psychiatric: Mood and affect are normal    Labs on Admission: I have personally reviewed following labs and imaging studies  CBC: Recent Labs  Lab 05/14/21 1910  WBC 11.0*   NEUTROABS 7.7  HGB 17.4*  HCT 56.0*  MCV 94.4  PLT 093*   Basic Metabolic Panel: Recent Labs  Lab 05/14/21 1910  NA 140  K 5.6*  CL 110  CO2 22  GLUCOSE 101*  BUN 26*  CREATININE 2.00*  CALCIUM 9.3   GFR: Estimated Creatinine Clearance: 16.2 mL/min (A) (by C-G formula based on SCr of 2 mg/dL (H)). Liver Function Tests: No results for input(s): AST, ALT, ALKPHOS, BILITOT, PROT, ALBUMIN in the last 168 hours. No results for input(s): LIPASE, AMYLASE in the last 168 hours. No results for input(s): AMMONIA in the last 168 hours. Coagulation Profile: No results for input(s): INR, PROTIME in the last 168 hours. Cardiac Enzymes: No results for input(s): CKTOTAL, CKMB, CKMBINDEX, TROPONINI in the last 168 hours. BNP (last 3 results) No results for input(s): PROBNP in the last 8760 hours. HbA1C: No results for input(s): HGBA1C in the last 72 hours. CBG: No results for input(s): GLUCAP in the last 168 hours. Lipid Profile: No results for input(s): CHOL, HDL, LDLCALC, TRIG, CHOLHDL, LDLDIRECT in the last 72 hours. Thyroid Function Tests: No results for input(s): TSH, T4TOTAL, FREET4, T3FREE, THYROIDAB in the last 72 hours. Anemia Panel: No results for input(s): VITAMINB12, FOLATE, FERRITIN, TIBC, IRON, RETICCTPCT in the last 72 hours. Urine analysis:    Component Value Date/Time   COLORURINE YELLOW 05/14/2021 1910   APPEARANCEUR CLEAR 05/14/2021 1910   LABSPEC 1.025 05/14/2021 1910   PHURINE 6.0 05/14/2021 1910   GLUCOSEU 100 (A) 05/14/2021 1910   HGBUR TRACE (A) 05/14/2021 1910   BILIRUBINUR NEGATIVE 05/14/2021 [place] NEGATIVE 05/14/2021 1910   PROTEINUR >300 (A) 05/14/2021 1910   NITRITE NEGATIVE 05/14/2021 1910   LEUKOCYTESUR NEGATIVE 05/14/2021 1910    Radiological Exams on Admission: DG Chest 2 View  Result Date: 05/14/2021 CLINICAL DATA:  Hypoxia. EXAM: CHEST - 2 VIEW COMPARISON:  Chest radiograph dated 10/29/2015. FINDINGS: No focal consolidation,  pleural effusion, pneumothorax. Stable cardiomegaly. Atherosclerotic calcification of the aorta. Degenerative changes of the spine. No acute osseous pathology. IMPRESSION: No active cardiopulmonary disease. Stable cardiomegaly. Electronically Signed   By: Milas Hock  Radparvar M.D.   On: 05/14/2021 19:56   CT HEAD WO CONTRAST (5MM)  Result Date: 05/14/2021 CLINICAL DATA:  BIB EMS from home. Pt was acting funny per husband. EMS arrived on scene to find pt altered and confused but pt came around. Vitals WNL for EMS. BGL 126. Pt states she is fine and does not need to be here. Thinks she might have taken too much gabapentin EXAM: CT HEAD WITHOUT CONTRAST TECHNIQUE: Contiguous axial images were obtained from the base of the skull through the vertex without intravenous contrast. COMPARISON:  12/06/2012. FINDINGS: Brain: No evidence of acute infarction, hemorrhage, hydrocephalus, extra-axial collection or mass lesion/mass effect. Mild, age-appropriate, ventricular sulcal enlargement. Mild periventricular white matter hypoattenuation is noted consistent with chronic microvascular ischemic change. Vascular: No hyperdense vessel or unexpected calcification. Skull: Normal. Negative for fracture or focal lesion. Sinuses/Orbits: Visualized globes and orbits are unremarkable. Visualized sinuses are clear. Other: None. IMPRESSION: 1. No acute intracranial abnormalities. 2. Age-appropriate volume loss and mild chronic microvascular ischemic change. Electronically Signed   By: Lajean Manes M.D.   On: 05/14/2021 20:00     Assessment/Plan 87 year old female with history of CAD, HTN, CKD 4, polycythemia vera, thrombocytosis, hypothyroidism, diabetes with neuropathy, s/p left tibial plateau fracture on 7/28 treated non surgically, presenting with brief period of decreased responsiveness.      Acute metabolic encephalopathy(resolved) - Patient with brief period of decreased responsiveness, described as difficulty to arouse  followed by confusion - No focal deficits on physical exam and CT head with no acute findings - Differential includes medication, hypertensive urgency, TIA, hypoxia - Neurologic checks with fall and aspiration precautions -Monitoring, echo and carotid Doppler - Hold sedating meds such as gabapentin and Klonopin    Hypoxia, in the setting of tibial plateau fracture on 7/28 - Patient denies shortness of breath but was hypoxic to 86 while in the ED -Chest x-ray clear.  COVID and flu negative.  Patient otherwise asymptomatic - Differential includes PE but unable to get CTA due to stage IV CKD - We will get D-dimer as well as bilateral lower extremity Dopplers - Supplemental O2 to keep sats over 94    Tibial plateau fracture 04/21/21, left, closed - Continue boot - Pain control - PT eval.  Assistance with getting up    Hypertensive urgency - SBP of 222 in the ED - Continue home metoprolol , benazepril and add and titrate to keep systolic under 350   type 2 DM with diabetic neuropathy - Sliding scale insulin coverage   Acquired hypothyroidism - Continue levothyroxine    CKD (chronic kidney disease) stage 4,  -at baseline    CAD (coronary artery disease), native coronary artery - No complaints of chest pain, EKG nonacute and troponin negative x2    Polycythemia vera (HCC)/thrombocytosis - Hemoglobin 17.4 with platelets 572,000 - IV hydration - Consider hematology consult    DVT prophylaxis: Lovenox  Code Status: full code  Family Communication:  none  Disposition Plan: Back to previous home environment Consults called: none  Status:At the time of admission, it appears that the appropriate admission status for this patient is INPATIENT. This is judged to be reasonable and necessary in order to provide the required intensity of service to ensure the patient's safety given the presenting symptoms, physical exam findings, and initial radiographic and laboratory data in the context  of their  Comorbid conditions.   Patient requires inpatient status due to high intensity of service, high risk for further deterioration and high  frequency of surveillance required.   I certify that at the point of admission it is my clinical judgment that the patient will require inpatient hospital care spanning beyond [place] MD [facility]     05/14/2021, 10:30 PM

## 2021-05-14 NOTE — ED Triage Notes (Signed)
BIB EMS from home. Pt was acting funny per husband. EMS arrived on scene to find pt altered and confused but pt came around. Vitals WNL for EMS. BGL 126. Pt states she is fine and does not need to be here. Thinks she might have taken too much gabapentin.

## 2021-05-14 NOTE — ED Provider Notes (Signed)
[facility] Emergency Department Provider Note  ____________________________________________   I have reviewed the triage vital signs and the nursing notes.   HISTORY  Chief Complaint Altered mental status   History limited by: Not Limited   HPI Cassandra [name] is a 87 y.o. female who presents to the emergency department today because of concern for an episode of altered mental status. The patient states that she first started feeling abnormal last night. Started having shaking. She attributed it to potentially accidentally takng an extra dose of her medication. Today however there was an episode when her husband thought she was asleep. She states she was aware of him trying to wake her but she was having a hard time waking up. At the time of my exam the patient states she feels back to baseline. The patient denies any shortness of breath or chest pain.   Records reviewed. Per medical record review patient has a history of CAD, DM, HTN, HLD. Recent diagnoses of fracture to her left tibial plateau.   Past Medical History:  Diagnosis Date   Arthritis    osteoarthritis in hands, knees ( R torn cartilage, L meniscus removed)    CAD (coronary artery disease)    Diabetes mellitus without complication ([place])    Glaucoma    Hyperlipidemia    Hypertension    Hypothyroidism     Patient Active Problem List   Diagnosis Date Noted   Erythrocytosis 02/19/2020   Thrombocytosis 02/18/2020   Essential hypertension 01/24/2015   Type 2 DM with diabetic neuropathy affecting both sides of body ([place]) 01/24/2015   Restless legs syndrome 01/24/2015   Chronic constipation 01/24/2015    Past Surgical History:  Procedure Laterality Date   ABDOMINAL HYSTERECTOMY     CORONARY ANGIOPLASTY WITH STENT PLACEMENT     FOOT SURGERY Bilateral    KNEE ARTHROSCOPY W/ MENISCECTOMY Left    Also has R knee injury (torn cartilage)     Prior to Admission medications   Medication Sig  Start Date End Date Taking? Authorizing Provider  albuterol (PROAIR HFA) 108 (90 Base) MCG/ACT inhaler Inhale into the lungs. Patient not taking: Reported on 03/15/2020 03/20/17   [provider]  aspirin EC 81 MG tablet Take 1 tablet by mouth daily.    [provider]  azelastine (ASTELIN) 0.1 % nasal spray Place 2 sprays into the nose.    [provider]  benazepril (LOTENSIN) 40 MG tablet Take 1 tablet (40 mg total) by mouth daily. 01/21/15   Crecencio Mc, MD  cetirizine (ZYRTEC) 10 MG tablet Take 1 tablet by mouth daily.    [provider]  Cholecalciferol (VITAMIN D-1000 MAX ST) 1000 units tablet Take by mouth. 04/26/17   [provider]  clonazePAM (KLONOPIN) 0.5 MG tablet Take by mouth. 02/14/17   [provider]  cloNIDine (CATAPRES) 0.1 MG tablet Take 1 tablet (0.1 mg total) by mouth daily as needed (Systolic blood pressure 106 or higher). 04/18/21 04/18/22  Harvest Dark, MD  DROXIA 300 MG capsule TAKE 1 CAPSULE (300 MG TOTAL) BY MOUTH DAILY. MAY TAKE WITH FOOD TO MINIMIZE GI SIDE EFFECTS. 04/23/20   Verlon Au, NP  gabapentin (NEURONTIN) 300 MG capsule Take 1 capsule by mouth 3 (three) times daily.  01/05/15 03/15/20  [provider]  glimepiride (AMARYL) 2 MG tablet Take 0.5 tablets by mouth daily. Take 0.5 tablet daily with largest meal 12/08/14   [provider]  glucose blood test strip Use once  daily. Use as instructed. 02/21/17   [provider]  HYDROcodone-acetaminophen (NORCO/VICODIN) 5-325 MG tablet 1/2 po bid prn Earliest Fill Date: 07/19/17 07/19/17   [provider]  latanoprost (XALATAN) 0.005 % ophthalmic solution Apply to eye. 09/13/15   [provider]  levothyroxine (SYNTHROID, LEVOTHROID) 75 MCG tablet Take 88 tablets by mouth daily. Take on an empty stomach 30 to 60 minutes before breakfast. 12/08/14   [provider]  LOTREL 5-20 MG capsule  07/03/17   [provider]  meloxicam (MOBIC) 15 MG tablet TAKE 1 TABLET (15 MG TOTAL) BY MOUTH DAILY WITH BREAKFAST. 06/05/17   [provider]  metoprolol succinate (TOPROL-XL) 50 MG 24 hr tablet Take 1 tablet by mouth daily. 11/02/14   [provider]  rOPINIRole (REQUIP) 0.5 MG tablet Take 1 tablet by mouth 2 (two) times daily. 01/05/15 03/15/20  [provider]  rosuvastatin (CRESTOR) 40 MG tablet Take 1 tablet by mouth daily. 01/07/15   [provider]  timolol (BETIMOL) 0.5 % ophthalmic solution Apply 1 drop to eye daily. Place one 1 drop into both eyes every morning.    [provider]  timolol (TIMOPTIC) 0.5 % ophthalmic solution INSTILL 1 DROP INTO EACH EYE EVERY MORNING AS DIRECTED 06/22/17   [provider]  tizanidine (ZANAFLEX) 2 MG capsule TAKE 1 CAPSULE (2 MG TOTAL) BY MOUTH 3 (THREE) TIMES DAILY AS NEEDED FOR MUSCLE SPASMS. 05/21/17   [provider]  triamcinolone cream (KENALOG) 0.1 % APPLY TO AFFECTED AREA TWICE A DAY AS NEEDED 01/01/15   [provider]    Allergies Atorvastatin, Cyclobenzaprine, Hydrocodone-acetaminophen, Mirtazapine, Oxycodone-acetaminophen, Paroxetine hcl, Penicillins, Propoxyphene, Ropinirole, and Trazodone  Family History  Problem Relation Age of Onset   Hypertension Mother    Hypertension Father    Early death Father    Heart disease Father     Social History Social History   Tobacco Use   Smoking status: Former    Types: Cigarettes    Quit date: 01/21/1980    Years since quitting: 41.3   Smokeless tobacco: Never  Substance Use Topics   Alcohol use: Yes    Alcohol/week: 0.0 standard drinks    Comment: occasionally   Drug use: No    Review of Systems Constitutional: No fever/chills Eyes: No visual changes. ENT: No sore throat. Cardiovascular: Denies chest pain. Respiratory: Denies shortness of breath. Gastrointestinal: No abdominal pain.  No nausea, no vomiting.  No diarrhea.    Genitourinary: Negative for dysuria. Musculoskeletal: Negative for back pain. Skin: Negative for rash. Neurological: Positive for episode of altered mental status.   ____________________________________________   PHYSICAL EXAM:  VITAL SIGNS: ED Triage Vitals  Enc Vitals Group     BP 05/14/21 1826 (!) 222/90     Pulse Rate 05/14/21 1826 66     Resp 05/14/21 1826 16     Temp 05/14/21 1826 98 F (36.7 C)     Temp Source 05/14/21 1826 Oral     SpO2 05/14/21 1826 98 %     Weight 05/14/21 1828 158 lb 11.7 oz (72 kg)     Height 05/14/21 1828 5' (1.524 m)     Head Circumference --      Peak Flow --      Pain Score 05/14/21 1827 0   Constitutional: Alert and oriented.  Eyes: Conjunctivae are normal.  ENT      Head: Normocephalic and atraumatic.      Nose: No congestion/rhinnorhea.  Mouth/Throat: Mucous membranes are moist.      Neck: No stridor. Hematological/Lymphatic/Immunilogical: No cervical lymphadenopathy. Cardiovascular: Normal rate, regular rhythm.  No murmurs, rubs, or gallops.  Respiratory: Normal respiratory effort without tachypnea nor retractions. Breath sounds are clear and equal bilaterally. No wheezes/rales/rhonchi. Gastrointestinal: Soft and non tender. No rebound. No guarding.  Genitourinary: Deferred Musculoskeletal: Normal range of motion in all extremities. No lower extremity edema. Neurologic:  Normal speech and language. No gross focal neurologic deficits are appreciated.  Skin:  Skin is warm, dry and intact. No rash noted. Psychiatric: Mood and affect are normal. Speech and behavior are normal. Patient exhibits appropriate insight and judgment.  ____________________________________________    LABS (pertinent positives/negatives)  Trop hs 10 COVID negative CBC wbc 11.0, hgb 17.4, plt 572 BMP na 140, k 5.6 (hemolyzed), glu 101, cr 2.00 UA clear, 0-5 RBC and WBC  ____________________________________________   EKG  I, Nance Pear,  attending physician, personally viewed and interpreted this EKG  EKG Time: 1827 Rate: 70 Rhythm: normal sinus rhythm Axis: left axis deviation Intervals: qtc 421 QRS: narrow, LVH ST changes: no st elevation Impression: abnormal ekg, artifact limits interpretation   ____________________________________________    RADIOLOGY  CT head No acute abnormality  CXR No acute abnormality  ____________________________________________   PROCEDURES  Procedures  ____________________________________________   INITIAL IMPRESSION / ASSESSMENT AND PLAN / ED COURSE  Pertinent labs & imaging results that were available during my care of the patient were reviewed by me and considered in my medical decision making (see chart for details).   Patient presented to the emergency department today because of concerns for episode of altered mental status.  While here in the emergency department however she was observed to have oxygen levels that dropped down to 86 with a good waveform.  This was on room air.  Patient denies oxygen use at home.  Work-up was started to evaluate for possible cause of hypoxia.  COVID was negative.  Chest x-ray without any pneumonia.  Unfortunately GFR limits ability to obtain CT angio at this time.  Will plan on admission to the hospital service for further work-up and management of hypoxia.  Discussed this with the patient.   ____________________________________________   FINAL CLINICAL IMPRESSION(S) / ED DIAGNOSES  Final diagnoses:  Hypoxia     Note: This dictation was prepared with Dragon dictation. Any transcriptional errors that result from this process are unintentional     Nance Pear, MD 05/14/21 2251

## 2021-05-14 NOTE — ED Notes (Signed)
Pt placed on bedpan at this time.

## 2021-05-15 ENCOUNTER — Inpatient Hospital Stay: Payer: Medicare Other

## 2021-05-15 ENCOUNTER — Encounter: Payer: Self-pay | Admitting: Obstetrics and Gynecology

## 2021-05-15 DIAGNOSIS — R0902 Hypoxemia: Secondary | ICD-10-CM | POA: Diagnosis present

## 2021-05-15 DIAGNOSIS — I160 Hypertensive urgency: Secondary | ICD-10-CM | POA: Diagnosis not present

## 2021-05-15 LAB — BASIC METABOLIC PANEL
Anion gap: 9 (ref 5–15)
BUN: 25 mg/dL — ABNORMAL HIGH (ref 8–23)
CO2: 24 mmol/L (ref 22–32)
Calcium: 8.5 mg/dL — ABNORMAL LOW (ref 8.9–10.3)
Chloride: 106 mmol/L (ref 98–111)
Creatinine, Ser: 1.88 mg/dL — ABNORMAL HIGH (ref 0.44–1.00)
GFR, Estimated: 25 mL/min — ABNORMAL LOW (ref >60)
Glucose, Bld: 91 mg/dL (ref 70–99)
Potassium: 4.2 mmol/L (ref 3.5–5.1)
Sodium: 139 mmol/L (ref 135–145)

## 2021-05-15 LAB — HEMOGLOBIN A1C
Hgb A1c MFr Bld: 6.6 % — ABNORMAL HIGH (ref 4.8–5.6)
Mean Plasma Glucose: 142.72 mg/dL

## 2021-05-15 LAB — PROTIME-INR
INR: 1.1 (ref 0.8–1.2)
Prothrombin Time: 14.4 seconds (ref 11.4–15.2)

## 2021-05-15 LAB — LIPID PANEL
Cholesterol: 172 mg/dL (ref 0–200)
HDL: 45 mg/dL (ref >40)
LDL Cholesterol: 102 mg/dL — ABNORMAL HIGH (ref 0–99)
Total CHOL/HDL Ratio: 3.8 RATIO
Triglycerides: 127 mg/dL (ref <150)
VLDL: 25 mg/dL (ref 0–40)

## 2021-05-15 LAB — D-DIMER, QUANTITATIVE: D-Dimer, Quant: >20 ug/mL-FEU — ABNORMAL HIGH (ref 0.00–0.50)

## 2021-05-15 LAB — CBG MONITORING, ED: Glucose-Capillary: 96 mg/dL (ref 70–99)

## 2021-05-15 LAB — APTT: aPTT: 45 seconds — ABNORMAL HIGH (ref 24–36)

## 2021-05-15 MED ORDER — GABAPENTIN 300 MG PO CAPS
300.0000 mg | ORAL_CAPSULE | Freq: Three times a day (TID) | ORAL | Status: DC
  Administered 2021-05-15: 300 mg via ORAL
  Filled 2021-05-15: qty 1

## 2021-05-15 MED ORDER — TECHNETIUM TO 99M ALBUMIN AGGREGATED
4.0000 | Freq: Once | INTRAVENOUS | Status: AC | PRN
  Administered 2021-05-15: 4.41 via INTRAVENOUS
  Filled 2021-05-15: qty 4

## 2021-05-15 MED ORDER — AMLODIPINE BESYLATE 5 MG PO TABS
5.0000 mg | ORAL_TABLET | Freq: Every day | ORAL | Status: DC
  Administered 2021-05-15: 5 mg via ORAL
  Filled 2021-05-15: qty 1

## 2021-05-15 MED ORDER — HEPARIN (PORCINE) 25000 UT/250ML-% IV SOLN
950.0000 [IU]/h | INTRAVENOUS | Status: DC
  Administered 2021-05-15: 950 [IU]/h via INTRAVENOUS
  Filled 2021-05-15: qty 250

## 2021-05-15 MED ORDER — METOPROLOL SUCCINATE ER 50 MG PO TB24: 50.0000 mg | ORAL_TABLET | Freq: Every day | ORAL | Status: DC

## 2021-05-15 MED ORDER — TIMOLOL MALEATE 0.5 % OP SOLG: 1.0000 [drp] | Freq: Every morning | OPHTHALMIC | Status: DC

## 2021-05-15 MED ORDER — LEVOTHYROXINE SODIUM 88 MCG PO TABS: 88.0000 ug | ORAL_TABLET | Freq: Every day | ORAL | Status: DC

## 2021-05-15 MED ORDER — BENAZEPRIL HCL 20 MG PO TABS
20.0000 mg | ORAL_TABLET | Freq: Every day | ORAL | Status: DC
  Administered 2021-05-15: 20 mg via ORAL
  Filled 2021-05-15: qty 1

## 2021-05-15 MED ORDER — HEPARIN BOLUS VIA INFUSION
3600.0000 [IU] | Freq: Once | INTRAVENOUS | Status: AC
  Administered 2021-05-15: 3600 [IU] via INTRAVENOUS
  Filled 2021-05-15: qty 3600

## 2021-05-15 MED ORDER — LATANOPROST 0.005 % OP SOLN: 1.0000 [drp] | Freq: Every day | OPHTHALMIC | Status: DC

## 2021-05-15 NOTE — Discharge Summary (Signed)
Cassandra [name] KTG:[id number] DOB: May 18, 1927 DOA: 05/14/2021  PCP: Rusty Aus, MD  Admit date: 05/14/2021 Discharge date: 05/15/2021  Time spent: 45 minutes  Recommendations for Outpatient Follow-up:  Pcp f/u 1-2 weeks  Consider dose reductions and/or discontinuation of mind-altering meds    Discharge Diagnoses:  Principal Problem:   Hypertensive urgency Active Problems:   Type 2 DM with diabetic neuropathy affecting both sides of body (HCC)   Acquired hypothyroidism   CKD (chronic kidney disease) stage 4, GFR 15-29 ml/min (HCC)   CAD (coronary artery disease), native coronary artery   Polycythemia vera ([place])   Hypoxia   Tibial plateau fracture 04/21/21, left, closed, with routine healing, subsequent encounter   Acute metabolic encephalopathy   Hypoxemia   Discharge Condition: stable  Diet recommendation: low sodium  Filed Weights   05/14/21 1828  Weight: 72 kg    History of present illness:  Cassandra [name] is a 87 y.o. female with medical history significant for CAD, HTN, CKD 4, polycythemia vera, thrombocytosis, hypothyroidism, diabetes with neuropathy, s/p left tibial plateau fracture on 7/28 treated non surgically, who was brought in by EMS after she was found difficult to arouse at home and appeared confused on arising, returning to baseline by arrival in the ED.  She was previously in her usual state of health and denies recent cough, shortness of breath, fever or chills.  Has had no nausea, vomiting, abdominal pain or diarrhea or dysuria.  Patient states that she was well when she was trying to be awoken by her husband.  She believes her symptoms might be attributed to mistakenly taking an extra dose of gabapentin  Hospital Course:  Patient admitted for acute metabolic encephalopathy probably from overdose of home gabapentin. Symptoms resolved promptly. K 5.6 on admission but spontaneously improved to normal on hospital day 1. O2 found to be upper 80s, though  patient asymtpomatic and with clear lungs. D dimer markedly elevated, lower extremity dopplers unremarkable, so heparin was started and vq scan obtained. Vq scan negative for PE. O2 subsequently removed and oxygen levels maintained normal. Patient very eager to discharge home. We did discuss the importance taking all medications as directed, and outpatient efforts to pare down mind-altering meds would benefit this patient.  Procedures: Vq scan   Consultations: none  Discharge Exam: Vitals:   05/15/21 1000 05/15/21 1316  BP:  (!) 156/48  Pulse: (!) 58 60  Resp:  12  Temp:    SpO2: 94% 94%    General exam: Appears calm and comfortable  Respiratory system: Clear to auscultation save for faint rales at bases Respiratory effort normal. Cardiovascular system: S1 & S2 heard, RRR. No JVD, murmurs, rubs, gallops or clicks. No pedal edema. Gastrointestinal system: Abdomen is obese, soft and nontender. No organomegaly or masses felt. Normal bowel sounds heard. Central nervous system: Alert and oriented. No focal neurological deficits. Extremities: Symmetric 5 x 5 power. Skin: No rashes, lesions or ulcers Psychiatry: Judgement and insight appear normal. Mood & affect appropriate.   Discharge Instructions   Discharge Instructions     Diet - low sodium heart healthy   Complete by: As directed    Increase activity slowly   Complete by: As directed       Allergies as of 05/15/2021       Reactions   Atorvastatin Other (See Comments)   MYALGIA   Cyclobenzaprine Other (See Comments)   Hydrocodone-acetaminophen    Other reaction(s): Hallucination when taking whole pill, pt tolerates taking 1/2 pill  Mirtazapine    Other reaction(s): Hallucination   Oxycodone-acetaminophen Other (See Comments)   Paroxetine Hcl Other (See Comments)   Penicillins Swelling   Lip and orbital swelling   Propoxyphene Other (See Comments)   Trazodone Other (See Comments)        Medication List      STOP taking these medications    clonazePAM 0.5 MG tablet Commonly known as: KLONOPIN   timolol 0.5 % ophthalmic solution Commonly known as: BETIMOL   tizanidine 2 MG capsule Commonly known as: ZANAFLEX   triamcinolone cream 0.1 % Commonly known as: KENALOG       TAKE these medications    albuterol 108 (90 Base) MCG/ACT inhaler Commonly known as: VENTOLIN HFA Inhale into the lungs.   aspirin EC 81 MG tablet Take 1 tablet by mouth daily.   azelastine 0.1 % nasal spray Commonly known as: ASTELIN Place 1 spray into both nostrils 2 (two) times daily.   benazepril 40 MG tablet Commonly known as: LOTENSIN Take 1 tablet (40 mg total) by mouth daily.   cetirizine 10 MG tablet Commonly known as: ZYRTEC Take 1 tablet by mouth daily.   Cholecalciferol 25 MCG (1000 UT) tablet Take 1,000 Units by mouth daily.   cloNIDine 0.1 MG tablet Commonly known as: Catapres Take 1 tablet (0.1 mg total) by mouth daily as needed (Systolic blood pressure 093 or higher).   Droxia 300 MG capsule Generic drug: hydroxyurea TAKE 1 CAPSULE (300 MG TOTAL) BY MOUTH DAILY. MAY TAKE WITH FOOD TO MINIMIZE GI SIDE EFFECTS.   gabapentin 300 MG capsule Commonly known as: NEURONTIN Take 1 capsule by mouth 3 (three) times daily.   glimepiride 2 MG tablet Commonly known as: AMARYL Take 0.5 tablets by mouth daily. Take 0.5 tablet daily with largest meal   glucose blood test strip Use once daily. Use as instructed.   HYDROcodone-acetaminophen 5-325 MG tablet Commonly known as: NORCO/VICODIN 1/2 po bid prn Earliest Fill Date: 07/19/17   latanoprost 0.005 % ophthalmic solution Commonly known as: XALATAN Place 1 drop into both eyes at bedtime.   levothyroxine 88 MCG tablet Commonly known as: SYNTHROID Take 88 mcg by mouth daily. Take on an empty stomach 30 to 60 minutes before breakfast   Lotrel 5-20 MG capsule Generic drug: amLODipine-benazepril Take 1 capsule by mouth daily.    meloxicam 15 MG tablet Commonly known as: MOBIC TAKE 1 TABLET (15 MG TOTAL) BY MOUTH DAILY WITH BREAKFAST.   metoprolol succinate 50 MG 24 hr tablet Commonly known as: TOPROL-XL Take 1 tablet by mouth daily.   rOPINIRole 0.25 MG tablet Commonly known as: REQUIP Take 0.25 mg by mouth at bedtime.   rosuvastatin 20 MG tablet Commonly known as: CRESTOR Take 20 mg by mouth daily.   sertraline 50 MG tablet Commonly known as: ZOLOFT Take 50 mg by mouth daily.   timolol 0.5 % ophthalmic gel-forming Commonly known as: TIMOPTIC-XR Place 1 drop into both eyes every morning. What changed: Another medication with the same name was removed. Continue taking this medication, and follow the directions you see here.       Allergies  Allergen Reactions   Atorvastatin Other (See Comments)    MYALGIA   Cyclobenzaprine Other (See Comments)   Hydrocodone-Acetaminophen     Other reaction(s): Hallucination when taking whole pill, pt tolerates taking 1/2 pill   Mirtazapine     Other reaction(s): Hallucination   Oxycodone-Acetaminophen Other (See Comments)   Paroxetine Hcl Other (See Comments)   Penicillins  Swelling    Lip and orbital swelling   Propoxyphene Other (See Comments)   Trazodone Other (See Comments)    Follow-up Information     Rusty Aus, MD Follow up.   Specialty: Internal Medicine Contact information: [place] [address] [phone]                  The results of significant diagnostics from this hospitalization (including imaging, microbiology, ancillary and laboratory) are listed below for reference.    Significant Diagnostic Studies: DG Chest 2 View  Result Date: 05/14/2021 CLINICAL DATA:  Hypoxia. EXAM: CHEST - 2 VIEW COMPARISON:  Chest radiograph dated 10/29/2015. FINDINGS: No focal consolidation, pleural effusion, pneumothorax. Stable cardiomegaly. Atherosclerotic calcification of the  aorta. Degenerative changes of the spine. No acute osseous pathology. IMPRESSION: No active cardiopulmonary disease. Stable cardiomegaly. Electronically Signed   By: Anner Crete M.D.   On: 05/14/2021 19:56   CT HEAD WO CONTRAST (5MM)  Result Date: 05/14/2021 CLINICAL DATA:  BIB EMS from home. Pt was acting funny per husband. EMS arrived on scene to find pt altered and confused but pt came around. Vitals WNL for EMS. BGL 126. Pt states she is fine and does not need to be here. Thinks she might have taken too much gabapentin EXAM: CT HEAD WITHOUT CONTRAST TECHNIQUE: Contiguous axial images were obtained from the base of the skull through the vertex without intravenous contrast. COMPARISON:  12/06/2012. FINDINGS: Brain: No evidence of acute infarction, hemorrhage, hydrocephalus, extra-axial collection or mass lesion/mass effect. Mild, age-appropriate, ventricular sulcal enlargement. Mild periventricular white matter hypoattenuation is noted consistent with chronic microvascular ischemic change. Vascular: No hyperdense vessel or unexpected calcification. Skull: Normal. Negative for fracture or focal lesion. Sinuses/Orbits: Visualized globes and orbits are unremarkable. Visualized sinuses are clear. Other: None. IMPRESSION: 1. No acute intracranial abnormalities. 2. Age-appropriate volume loss and mild chronic microvascular ischemic change. Electronically Signed   By: Lajean Manes M.D.   On: 05/14/2021 20:00   CT KNEE LEFT WO CONTRAST  Result Date: 04/27/2021 CLINICAL DATA:  Left knee pain after recent fall. Posterior knee pain. EXAM: CT OF THE LEFT KNEE WITHOUT CONTRAST TECHNIQUE: Multidetector CT imaging of the left knee was performed according to the standard protocol. Multiplanar CT image reconstructions were also generated. COMPARISON:  01/09/2012 FINDINGS: Bones/Joint/Cartilage Acute minimally displaced avulsion-type fracture of the posterior aspect of the central tibial plateau. Fragment measures  approximately 11 x 10 x 11 mm and likely includes the tibial attachment site of the posterior cruciate ligament (series 10, image 66). Moderate-large sized knee joint effusion/hemarthrosis. Subtle area of cortical lucency involving the anterior cortex of the mid to upper patella (series 8, image 65; series 5, image 74) may reflect a prominent vascular channel versus a nondisplaced fracture. No evidence of intra-articular extension of this finding. No additional fractures are identified. Chondroid-appearing matrix incidentally noted within the distal femoral metaphysis which was partially visualized on prior MRI of 2013, likely benign and incidental. Ligaments Probable osseous avulsion of the tibial attachment of the PCL. Overall, ligamentous structures are suboptimally assessed by CT. Muscles and Tendons No acute musculotendinous abnormality by CT. Extensor mechanism appears intact. Soft tissues Mild soft tissue swelling at the anterior aspect of the knee. Moderate-sized Baker's cyst. No hematoma seen. Atherosclerotic vascular calcifications. IMPRESSION: 1. Acute minimally displaced avulsion-type fracture of the posterior aspect of the central tibial plateau. Appearance suggestive of osseous avulsion of the PCL. 2. Subtle area of cortical lucency  involving the anterior cortex of the mid to upper patella may reflect a prominent vascular channel versus a nondisplaced fracture. Correlate with point tenderness. 3. Moderate-large sized knee joint effusion/hemarthrosis. 4. Moderate-sized Baker's cyst. These results will be called to the ordering clinician or representative by the Radiologist Assistant, and communication documented in the PACS or [employer]. Electronically Signed   By: Davina Poke D.O.   On: 04/27/2021 13:33   NM Pulmonary Perfusion  Result Date: 05/15/2021 CLINICAL DATA:  No shortness of breath. No history of lung disease. No history of PE or DVT. Patient thinks she took an extra dose of  Gabapentin which made it difficult for her to wake up and confused. Elevated D-dimer.Patient fell a few weeks ago and fractured her left tibial plateau. No recent surgeries. EXAM: NUCLEAR MEDICINE PERFUSION LUNG SCAN TECHNIQUE: Perfusion images were obtained in multiple projections after intravenous injection of radiopharmaceutical. Ventilation scans intentionally deferred if perfusion scan and chest x-ray adequate for interpretation during COVID 19 epidemic. RADIOPHARMACEUTICALS:  4.41 mCi Tc-65m MAA IV COMPARISON:  Chest radiograph, 05/14/2021 FINDINGS: Perfusion to the lungs is normal.  No defects. IMPRESSION: No evidence of pulmonary thromboembolism. Normal pulmonary perfusion study. Electronically Signed   By: Lajean Manes M.D.   On: 05/15/2021 12:11   US Carotid Bilateral  Result Date: 05/15/2021 CLINICAL DATA:  TIA. EXAM: BILATERAL CAROTID DUPLEX ULTRASOUND TECHNIQUE: Pearline Cables scale imaging, color Doppler and duplex ultrasound were performed of bilateral carotid and vertebral arteries in the neck. COMPARISON:  None. FINDINGS: Criteria: Quantification of carotid stenosis is based on velocity parameters that correlate the residual internal carotid diameter with NASCET-based stenosis levels, using the diameter of the distal internal carotid lumen as the denominator for stenosis measurement. The following velocity measurements were obtained: RIGHT ICA: 80/13 cm/sec CCA: 26/9 cm/sec SYSTOLIC ICA/CCA RATIO:  1.7 ECA: 58 cm/sec LEFT ICA: 101/19 cm/sec CCA: 48/5 cm/sec SYSTOLIC ICA/CCA RATIO:  1.8 ECA: 91 cm/sec RIGHT CAROTID ARTERY: Grayscale images demonstrate mild calcific plaque formation in the carotid bulb. Normal color flow Doppler signal with normal Doppler waveforms. RIGHT VERTEBRAL ARTERY:  Patent without antegrade flow direction. LEFT CAROTID ARTERY: Grayscale images demonstrate no significant calcific plaque formation in the carotid bulb. Normal color flow Doppler signal with normal Doppler waveforms.  LEFT VERTEBRAL ARTERY:  Patent with antegrade flow direction. IMPRESSION: No evidence of hemodynamically significant stenosis of either right or the left internal carotid artery. Electronically Signed   By: Lucienne Capers M.D.   On: 05/15/2021 00:21   US Venous Img Lower Bilateral (DVT)  Result Date: 05/15/2021 CLINICAL DATA:  Hypoxia. EXAM: Bilateral LOWER EXTREMITY VENOUS DOPPLER ULTRASOUND TECHNIQUE: Gray-scale sonography with compression, as well as color and duplex ultrasound, were performed to evaluate the deep venous system(s) from the level of the common femoral vein through the popliteal and proximal calf veins. COMPARISON:  Right lower extremity ultrasound dated 05/18/2020. FINDINGS: VENOUS Normal compressibility of the common femoral, superficial femoral, and popliteal veins, as well as the visualized calf veins. Visualized portions of profunda femoral vein and great saphenous vein unremarkable. No filling defects to suggest DVT on grayscale or color Doppler imaging. Doppler waveforms show normal direction of venous flow, normal respiratory plasticity and response to augmentation. Limited views of the contralateral common femoral vein are unremarkable. OTHER None. Limitations: none IMPRESSION: Negative. Electronically Signed   By: Anner Crete M.D.   On: 05/15/2021 00:15    Microbiology: Recent Results (from the past 240 hour(s))  Resp Panel by RT-PCR (Flu A&B,  Covid) Nasopharyngeal Swab     Status: None   Collection Time: 05/14/21  7:34 PM   Specimen: Nasopharyngeal Swab; Nasopharyngeal(NP) swabs in vial transport medium  Result Value Ref Range Status   SARS Coronavirus 2 by RT PCR NEGATIVE NEGATIVE Final    Comment: (NOTE) SARS-CoV-2 target nucleic acids are NOT DETECTED.  The SARS-CoV-2 RNA is generally detectable in upper respiratory specimens during the acute phase of infection. The lowest concentration of SARS-CoV-2 viral copies this assay can detect is 138 copies/mL. A  negative result does not preclude SARS-Cov-2 infection and should not be used as the sole basis for treatment or other patient management decisions. A negative result may occur with  improper specimen collection/handling, submission of specimen other than nasopharyngeal swab, presence of viral mutation(s) within the areas targeted by this assay, and inadequate number of viral copies(<138 copies/mL). A negative result must be combined with clinical observations, patient history, and epidemiological information. The expected result is Negative.  Fact Sheet for Patients:  EntrepreneurPulse.com.au  Fact Sheet for Healthcare Providers:  IncredibleEmployment.be  This test is no t yet approved or cleared by the [place] FDA and  has been authorized for detection and/or diagnosis of SARS-CoV-2 by FDA under an Emergency Use Authorization (EUA). This EUA will remain  in effect (meaning this test can be used) for the duration of the COVID-19 declaration under Section 564(b)(1) of the Act, 21 U.S.C.section 360bbb-3(b)(1), unless the authorization is terminated  or revoked sooner.       Influenza A by PCR NEGATIVE NEGATIVE Final   Influenza B by PCR NEGATIVE NEGATIVE Final    Comment: (NOTE) The Xpert Xpress SARS-CoV-2/FLU/RSV plus assay is intended as an aid in the diagnosis of influenza from Nasopharyngeal swab specimens and should not be used as a sole basis for treatment. Nasal washings and aspirates are unacceptable for Xpert Xpress SARS-CoV-2/FLU/RSV testing.  Fact Sheet for Patients: EntrepreneurPulse.com.au  Fact Sheet for Healthcare Providers: IncredibleEmployment.be  This test is not yet approved or cleared by the [place] FDA and has been authorized for detection and/or diagnosis of SARS-CoV-2 by FDA under an Emergency Use Authorization (EUA). This EUA will remain in effect (meaning this test can  be used) for the duration of the COVID-19 declaration under Section 564(b)(1) of the Act, 21 U.S.C. section 360bbb-3(b)(1), unless the authorization is terminated or revoked.  Performed at [facility], [address] [place], [place] [address]      Labs: Basic Metabolic Panel: Recent Labs  Lab 05/14/21 1910 05/15/21 0914  NA 140 139  K 5.6* 4.2  CL 110 106  CO2 22 24  GLUCOSE 101* 91  BUN 26* 25*  CREATININE 2.00* 1.88*  CALCIUM 9.3 8.5*   Liver Function Tests: No results for input(s): AST, ALT, ALKPHOS, BILITOT, PROT, ALBUMIN in the last 168 hours. No results for input(s): LIPASE, AMYLASE in the last 168 hours. No results for input(s): AMMONIA in the last 168 hours. CBC: Recent Labs  Lab 05/14/21 1910  WBC 11.0*  NEUTROABS 7.7  HGB 17.4*  HCT 56.0*  MCV 94.4  PLT 572*   Cardiac Enzymes: No results for input(s): CKTOTAL, CKMB, CKMBINDEX, TROPONINI in the last 168 hours. BNP: BNP (last 3 results) No results for input(s): BNP in the last 8760 hours.  ProBNP (last 3 results) No results for input(s): PROBNP in the last 8760 hours.  CBG: Recent Labs  Lab 05/15/21 0944  GLUCAP 96       Signed:  Olen Cordial  Manfred Arch MD.  [facility] 05/15/2021, 2:14 PM

## 2021-05-15 NOTE — Care Management CC44 (Signed)
Condition Code 44 Documentation Completed  Patient Details  Name: Cassandra [name] MRN: [id number] Date of Birth: 17-Aug-1927   Condition Code 44 given:  Yes Patient signature on Condition Code 44 notice:  Yes Documentation of 2 MD's agreement:  Yes Code 44 added to claim:  Yes  Reviewed with patient at bedside. Copy provided to patient for reference.   [place], LCSW 05/15/2021, 2:28 PM

## 2021-05-15 NOTE — Progress Notes (Signed)
PT Cancellation Note  Patient Details Name: Cassandra [name] MRN: [id number] DOB: [birth year]   Cancelled Treatment:    Reason Eval/Treat Not Completed: Patient not medically ready Pt with elevated d-dimer and on heparin gtt at this time with imaging being performed to assess for possible pulmonary embolism. Will hold PT Evaluation at this time and f/u once imaging has been completed.  Lavone Nian, PT, DPT 05/15/21, 11:18 AM    Waunita Schooner 05/15/2021, 11:18 AM

## 2021-05-15 NOTE — Progress Notes (Signed)
PROGRESS NOTE    Cassandra [name]  GGY:[id number] DOB: Sep 21, 1927 DOA: 05/14/2021 PCP: Rusty Aus, MD  Outpatient Specialists: nephrology, cardiology, hematology    Brief Narrative:   Cassandra [name] is a 87 y.o. female with medical history significant for CAD, HTN, CKD 4, polycythemia vera, thrombocytosis, hypothyroidism, diabetes with neuropathy, s/p left tibial plateau fracture on 7/28 treated non surgically, who was brought in by EMS after she was found difficult to arouse at home and appeared confused on arising, returning to baseline by arrival in the ED.  She was previously in her usual state of health and denies recent cough, shortness of breath, fever or chills.  Has had no nausea, vomiting, abdominal pain or diarrhea or dysuria.  Patient states that she was well when she was trying to be awoken by her husband.  She believes her symptoms might be attributed to mistakenly taking an extra dose of gabapentin   Assessment & Plan:   Principal Problem:   Hypertensive urgency Active Problems:   Type 2 DM with diabetic neuropathy affecting both sides of body (HCC)   Acquired hypothyroidism   CKD (chronic kidney disease) stage 4, GFR 15-29 ml/min (HCC)   CAD (coronary artery disease), native coronary artery   Polycythemia vera (HCC)   Hypoxia   Tibial plateau fracture 04/21/21, left, closed, with routine healing, subsequent encounter   Acute metabolic encephalopathy  # Acute encephalopathy Resolved. Patient thinks she took an extra dose of gabapentin - monitor  # Acute hypoxic respiratory failure O2 80s in ED. Patient asymptomatic. D dimer markedly elevated. LE dopplers neg. At risk for dvt given recent tibial plateu fracture currently in a splint.] - v/q scan ordered - iv heparin ordered  # Hyperkalemia 5.6 yesterday - repeat bmp - telemetry  # Hypertensive urgency on chronic hypertension BP improved this morning - resume home meds  # CKD stage 4 At baseline  #  T2DM Glucose wnl - SSI  # Polycythemia vera Hgb 17s - outpt heme f/u  # CAD Asymptomatic. Remote hx PCI with stent. - outpt cardiology f/u - hold aspirin while on heparin  # Chronic pain - resume gabapentin  # Hypothyroid - home synthroid   DVT prophylaxis: heparin therapeutic Code Status: full Family Communication: none @ bedside  Level of care: Progressive Cardiac Status is: Inpatient  Remains inpatient appropriate because:Inpatient level of care appropriate due to severity of illness  Dispo: The patient is from: Home              Anticipated d/c is to: Home              Patient currently is not medically stable to d/c.   Difficult to place patient No        Consultants:  none  Procedures: none  Antimicrobials:  none    Subjective: This morning very much wants to go home. No confusion. No cough or sob or chest pain  Objective: Vitals:   05/15/21 0030 05/15/21 0223 05/15/21 0400 05/15/21 0631  BP: (!) 145/52 (!) 170/56 (!) 144/70 (!) 154/61  Pulse: (!) 54 (!) 55 (!) 57 (!) 58  Resp:  14  14  Temp:  98.2 F (36.8 C)  98.7 F (37.1 C)  TempSrc:  Oral  Axillary  SpO2: 97% 95% 93% 93%  Weight:      Height:        Intake/Output Summary (Last 24 hours) at 05/15/2021 0904 Last data filed at 05/15/2021 0445 Gross per 24 hour  Intake --  Output 175 ml  Net -175 ml   Filed Weights   05/14/21 1828  Weight: 72 kg    Examination:  General exam: Appears calm and comfortable  Respiratory system: Clear to auscultation save for faint rales at bases Respiratory effort normal. Cardiovascular system: S1 & S2 heard, RRR. No JVD, murmurs, rubs, gallops or clicks. No pedal edema. Gastrointestinal system: Abdomen is obese, soft and nontender. No organomegaly or masses felt. Normal bowel sounds heard. Central nervous system: Alert and oriented. No focal neurological deficits. Extremities: Symmetric 5 x 5 power. Skin: No rashes, lesions or  ulcers Psychiatry: Judgement and insight appear normal. Mood & affect appropriate.     Data Reviewed: I have personally reviewed following labs and imaging studies  CBC: Recent Labs  Lab 05/14/21 1910  WBC 11.0*  NEUTROABS 7.7  HGB 17.4*  HCT 56.0*  MCV 94.4  PLT 875*   Basic Metabolic Panel: Recent Labs  Lab 05/14/21 1910  NA 140  K 5.6*  CL 110  CO2 22  GLUCOSE 101*  BUN 26*  CREATININE 2.00*  CALCIUM 9.3   GFR: Estimated Creatinine Clearance: 16.2 mL/min (A) (by C-G formula based on SCr of 2 mg/dL (H)). Liver Function Tests: No results for input(s): AST, ALT, ALKPHOS, BILITOT, PROT, ALBUMIN in the last 168 hours. No results for input(s): LIPASE, AMYLASE in the last 168 hours. No results for input(s): AMMONIA in the last 168 hours. Coagulation Profile: No results for input(s): INR, PROTIME in the last 168 hours. Cardiac Enzymes: No results for input(s): CKTOTAL, CKMB, CKMBINDEX, TROPONINI in the last 168 hours. BNP (last 3 results) No results for input(s): PROBNP in the last 8760 hours. HbA1C: No results for input(s): HGBA1C in the last 72 hours. CBG: No results for input(s): GLUCAP in the last 168 hours. Lipid Profile: Recent Labs    05/15/21 0702  CHOL 172  HDL 45  LDLCALC 102*  TRIG 127  CHOLHDL 3.8   Thyroid Function Tests: No results for input(s): TSH, T4TOTAL, FREET4, T3FREE, THYROIDAB in the last 72 hours. Anemia Panel: No results for input(s): VITAMINB12, FOLATE, FERRITIN, TIBC, IRON, RETICCTPCT in the last 72 hours. Urine analysis:    Component Value Date/Time   COLORURINE YELLOW 05/14/2021 1910   APPEARANCEUR CLEAR 05/14/2021 1910   LABSPEC 1.025 05/14/2021 1910   PHURINE 6.0 05/14/2021 1910   GLUCOSEU 100 (A) 05/14/2021 1910   HGBUR TRACE (A) 05/14/2021 1910   BILIRUBINUR NEGATIVE 05/14/2021 1910   KETONESUR NEGATIVE 05/14/2021 1910   PROTEINUR >300 (A) 05/14/2021 1910   NITRITE NEGATIVE 05/14/2021 1910   LEUKOCYTESUR NEGATIVE  05/14/2021 1910   Sepsis Labs: @LABRCNTIP (procalcitonin:4,lacticidven:4)  ) Recent Results (from the past 240 hour(s))  Resp Panel by RT-PCR (Flu A&B, Covid) Nasopharyngeal Swab     Status: None   Collection Time: 05/14/21  7:34 PM   Specimen: Nasopharyngeal Swab; Nasopharyngeal(NP) swabs in vial transport medium  Result Value Ref Range Status   SARS Coronavirus 2 by RT PCR NEGATIVE NEGATIVE Final    Comment: (NOTE) SARS-CoV-2 target nucleic acids are NOT DETECTED.  The SARS-CoV-2 RNA is generally detectable in upper respiratory specimens during the acute phase of infection. The lowest concentration of SARS-CoV-2 viral copies this assay can detect is 138 copies/mL. A negative result does not preclude SARS-Cov-2 infection and should not be used as the sole basis for treatment or other patient management decisions. A negative result may occur with  improper specimen collection/handling, submission of specimen other than nasopharyngeal  swab, presence of viral mutation(s) within the areas targeted by this assay, and inadequate number of viral copies(<138 copies/mL). A negative result must be combined with clinical observations, patient history, and epidemiological information. The expected result is Negative.  Fact Sheet for Patients:  EntrepreneurPulse.com.au  Fact Sheet for Healthcare Providers:  IncredibleEmployment.be  This test is no t yet approved or cleared by the [place] FDA and  has been authorized for detection and/or diagnosis of SARS-CoV-2 by FDA under an Emergency Use Authorization (EUA). This EUA will remain  in effect (meaning this test can be used) for the duration of the COVID-19 declaration under Section 564(b)(1) of the Act, 21 U.S.C.section 360bbb-3(b)(1), unless the authorization is terminated  or revoked sooner.       Influenza A by PCR NEGATIVE NEGATIVE Final   Influenza B by PCR NEGATIVE NEGATIVE Final     Comment: (NOTE) The Xpert Xpress SARS-CoV-2/FLU/RSV plus assay is intended as an aid in the diagnosis of influenza from Nasopharyngeal swab specimens and should not be used as a sole basis for treatment. Nasal washings and aspirates are unacceptable for Xpert Xpress SARS-CoV-2/FLU/RSV testing.  Fact Sheet for Patients: EntrepreneurPulse.com.au  Fact Sheet for Healthcare Providers: IncredibleEmployment.be  This test is not yet approved or cleared by the [place] FDA and has been authorized for detection and/or diagnosis of SARS-CoV-2 by FDA under an Emergency Use Authorization (EUA). This EUA will remain in effect (meaning this test can be used) for the duration of the COVID-19 declaration under Section 564(b)(1) of the Act, 21 U.S.C. section 360bbb-3(b)(1), unless the authorization is terminated or revoked.  Performed at [facility], [address], [place], [place] [address]          Radiology Studies: DG Chest 2 View  Result Date: 05/14/2021 CLINICAL DATA:  Hypoxia. EXAM: CHEST - 2 VIEW COMPARISON:  Chest radiograph dated 10/29/2015. FINDINGS: No focal consolidation, pleural effusion, pneumothorax. Stable cardiomegaly. Atherosclerotic calcification of the aorta. Degenerative changes of the spine. No acute osseous pathology. IMPRESSION: No active cardiopulmonary disease. Stable cardiomegaly. Electronically Signed   By: Anner Crete M.D.   On: 05/14/2021 19:56   CT HEAD WO CONTRAST (5MM)  Result Date: 05/14/2021 CLINICAL DATA:  BIB EMS from home. Pt was acting funny per husband. EMS arrived on scene to find pt altered and confused but pt came around. Vitals WNL for EMS. BGL 126. Pt states she is fine and does not need to be here. Thinks she might have taken too much gabapentin EXAM: CT HEAD WITHOUT CONTRAST TECHNIQUE: Contiguous axial images were obtained from the base of the skull through the vertex without intravenous  contrast. COMPARISON:  12/06/2012. FINDINGS: Brain: No evidence of acute infarction, hemorrhage, hydrocephalus, extra-axial collection or mass lesion/mass effect. Mild, age-appropriate, ventricular sulcal enlargement. Mild periventricular white matter hypoattenuation is noted consistent with chronic microvascular ischemic change. Vascular: No hyperdense vessel or unexpected calcification. Skull: Normal. Negative for fracture or focal lesion. Sinuses/Orbits: Visualized globes and orbits are unremarkable. Visualized sinuses are clear. Other: None. IMPRESSION: 1. No acute intracranial abnormalities. 2. Age-appropriate volume loss and mild chronic microvascular ischemic change. Electronically Signed   By: Lajean Manes M.D.   On: 05/14/2021 20:00   US Carotid Bilateral  Result Date: 05/15/2021 CLINICAL DATA:  TIA. EXAM: BILATERAL CAROTID DUPLEX ULTRASOUND TECHNIQUE: Pearline Cables scale imaging, color Doppler and duplex ultrasound were performed of bilateral carotid and vertebral arteries in the neck. COMPARISON:  None. FINDINGS: Criteria: Quantification of carotid stenosis is based on velocity  parameters that correlate the residual internal carotid diameter with NASCET-based stenosis levels, using the diameter of the distal internal carotid lumen as the denominator for stenosis measurement. The following velocity measurements were obtained: RIGHT ICA: 80/13 cm/sec CCA: 00/7 cm/sec SYSTOLIC ICA/CCA RATIO:  1.7 ECA: 58 cm/sec LEFT ICA: 101/19 cm/sec CCA: 62/2 cm/sec SYSTOLIC ICA/CCA RATIO:  1.8 ECA: 91 cm/sec RIGHT CAROTID ARTERY: Grayscale images demonstrate mild calcific plaque formation in the carotid bulb. Normal color flow Doppler signal with normal Doppler waveforms. RIGHT VERTEBRAL ARTERY:  Patent without antegrade flow direction. LEFT CAROTID ARTERY: Grayscale images demonstrate no significant calcific plaque formation in the carotid bulb. Normal color flow Doppler signal with normal Doppler waveforms. LEFT VERTEBRAL  ARTERY:  Patent with antegrade flow direction. IMPRESSION: No evidence of hemodynamically significant stenosis of either right or the left internal carotid artery. Electronically Signed   By: Lucienne Capers M.D.   On: 05/15/2021 00:21   US Venous Img Lower Bilateral (DVT)  Result Date: 05/15/2021 CLINICAL DATA:  Hypoxia. EXAM: Bilateral LOWER EXTREMITY VENOUS DOPPLER ULTRASOUND TECHNIQUE: Gray-scale sonography with compression, as well as color and duplex ultrasound, were performed to evaluate the deep venous system(s) from the level of the common femoral vein through the popliteal and proximal calf veins. COMPARISON:  Right lower extremity ultrasound dated 05/18/2020. FINDINGS: VENOUS Normal compressibility of the common femoral, superficial femoral, and popliteal veins, as well as the visualized calf veins. Visualized portions of profunda femoral vein and great saphenous vein unremarkable. No filling defects to suggest DVT on grayscale or color Doppler imaging. Doppler waveforms show normal direction of venous flow, normal respiratory plasticity and response to augmentation. Limited views of the contralateral common femoral vein are unremarkable. OTHER None. Limitations: none IMPRESSION: Negative. Electronically Signed   By: Anner Crete M.D.   On: 05/15/2021 00:15        Scheduled Meds:   stroke: mapping our early stages of recovery book   Does not apply Once   enoxaparin (LOVENOX) injection  30 mg Subcutaneous Q24H   insulin aspart  0-5 Units Subcutaneous QHS   insulin aspart  0-9 Units Subcutaneous TID WC   Continuous Infusions:  sodium chloride 75 mL/hr at 05/15/21 0104     LOS: 1 day    Time spent: 1 min    Desma Maxim, MD [facility]   If 7PM-7AM, please contact night-coverage [email or web address] Password [email or web address] 05/15/2021, 9:04 AM

## 2021-05-15 NOTE — Progress Notes (Signed)
OT Cancellation Note  Patient Details Name: Cassandra [name] MRN: [id number] DOB: [birth year]   Cancelled Treatment:    Reason Eval/Treat Not Completed: Medical issues which prohibited therapy Pt with elevated d-dimer and on heparin gtt at this time with imaging being performed to assess for possible pulmonary embolism. Will hold OT Evaluation at this time and f/u once imaging has been completed. Thank you.  Gerrianne Scale, [place], OTR/L ascom [phone] 05/15/21, 11:06 AM

## 2021-05-15 NOTE — ED Notes (Signed)
Pt assisted with bedpan and adjusted room temp

## 2021-05-15 NOTE — Progress Notes (Signed)
[place] for heparin Indication: concern for pulmonary embolus  Allergies  Allergen Reactions   Atorvastatin Other (See Comments)    MYALGIA   Cyclobenzaprine Other (See Comments)   Hydrocodone-Acetaminophen     Other reaction(s): Hallucination when taking whole pill, pt tolerates taking 1/2 pill   Mirtazapine     Other reaction(s): Hallucination   Oxycodone-Acetaminophen Other (See Comments)   Paroxetine Hcl Other (See Comments)   Penicillins Swelling    Lip and orbital swelling   Propoxyphene Other (See Comments)   Trazodone Other (See Comments)    Patient Measurements: Height: 5' (152.4 cm) Weight: 72 kg (158 lb 11.7 oz) IBW/kg (Calculated) : 45.5 Heparin Dosing Weight: 61 kg  Vital Signs: Temp: 98.7 F (37.1 C) (08/21 0631) Temp Source: Axillary (08/21 0631) BP: 154/61 (08/21 0631) Pulse Rate: 58 (08/21 0631)  Labs: Recent Labs    05/14/21 1910 05/14/21 2133 05/15/21 0914  HGB 17.4*  --   --   HCT 56.0*  --   --   PLT 572*  --   --   CREATININE 2.00*  --  1.88*  TROPONINIHS 9 10  --     Estimated Creatinine Clearance: 17.3 mL/min (A) (by C-G formula based on SCr of 1.88 mg/dL (H)).   Medical History: Past Medical History:  Diagnosis Date   Arthritis    osteoarthritis in hands, knees ( R torn cartilage, L meniscus removed)    CAD (coronary artery disease)    Diabetes mellitus without complication (HCC)    Glaucoma    Hyperlipidemia    Hypertension    Hypothyroidism      Assessment: 87 year old female presented with altered mental status which has since resolved and expected to possibly be s/t an extra dose of gabapentin at home. Patient also with hypoxic respiratory failure concerning for PE with elevated D dimer. Patient recently had tibial plateau fracture. Hypertensive urgency with elevated blood pressure on admission. Pharmacy consult for heparin dosing.  Goal of Therapy:  Heparin level 0.3-0.7  units/ml Monitor platelets by anticoagulation protocol: Yes   Plan:  Heparin 3600 unit bolus (on lower end s/t age) Heparin infusion at 950 units/hr Check HL at 1900 CBC daily while on heparin drip  Tawnya Crook, PharmD, BCPS Clinical Pharmacist 05/15/2021 9:44 AM

## 2021-06-10 IMAGING — US US RENAL
1 series · 14 of 25 positions shown · non-contrast
Comparison: None

CLINICAL DATA: Stage IV chronic kidney disease, hypertension,
diabetes mellitus, coronary artery disease, former smoker

EXAM:
RENAL / URINARY TRACT ULTRASOUND COMPLETE

[Series 1: us renal · 65 acquisitions, 14 frames shown]
[im 1/65]
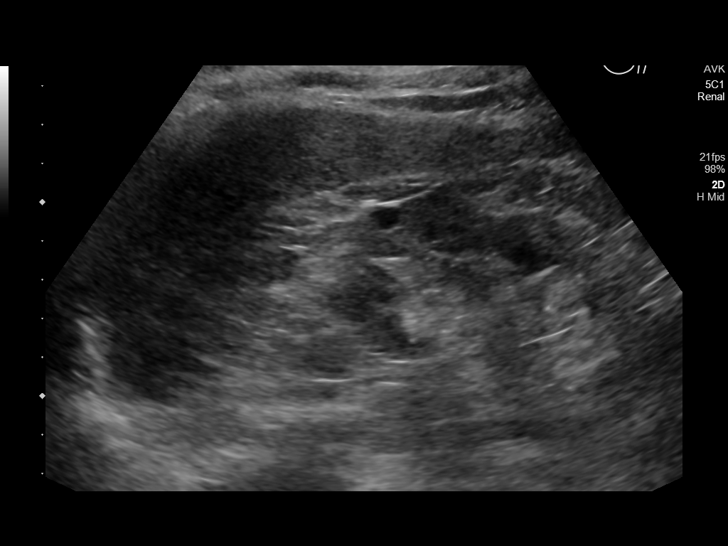
[im 6/65]
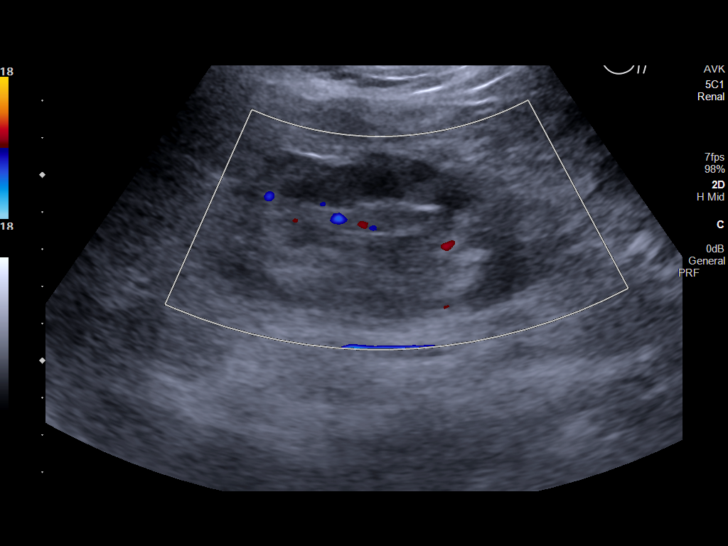
[im 11/65]
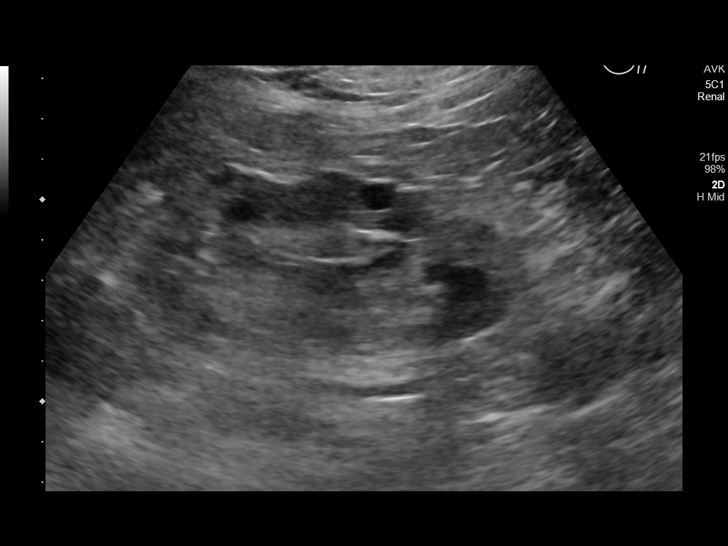
[im 17/65]
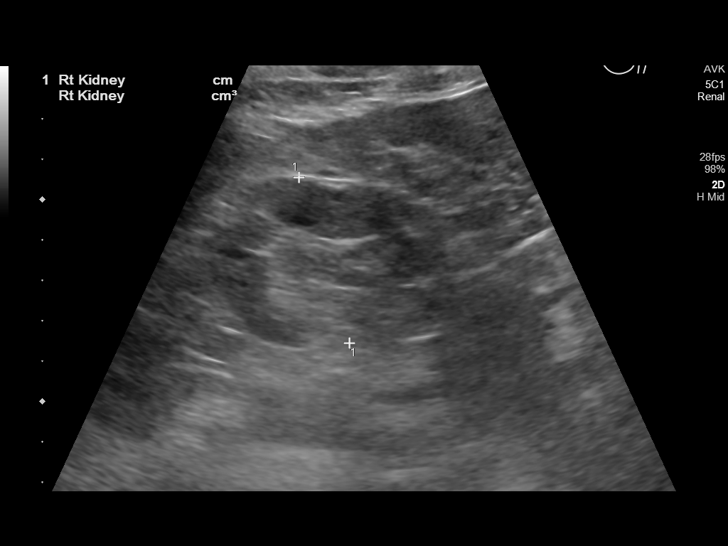
[im 22/65]
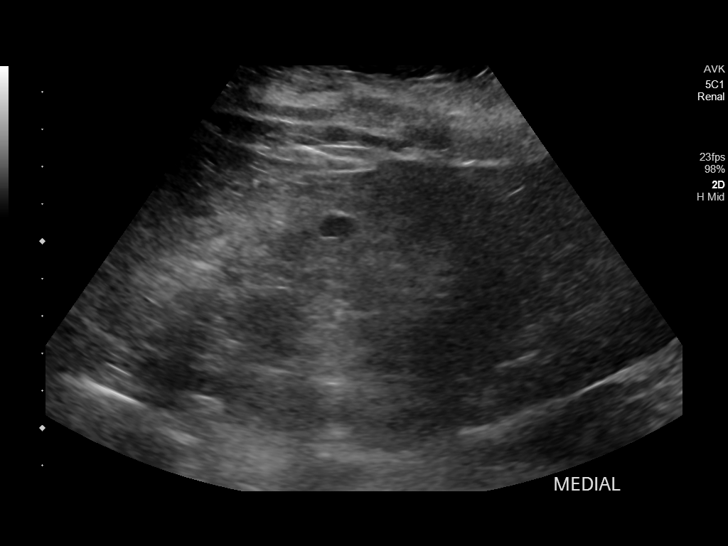
[im 25/65]
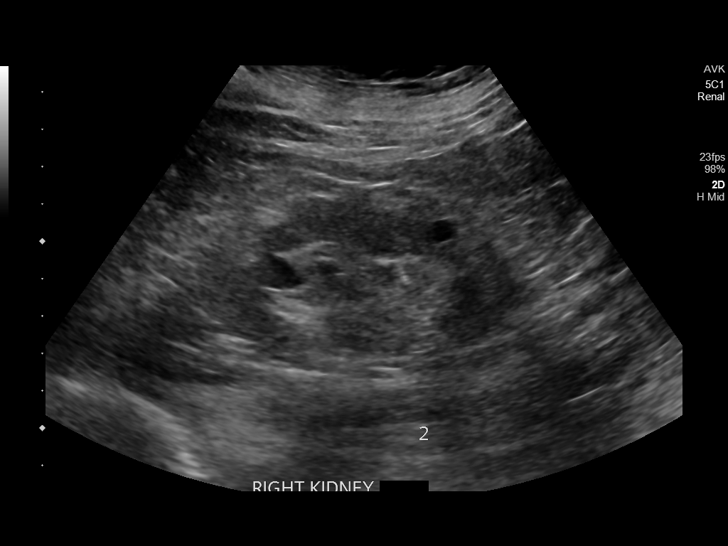
[im 30/65]
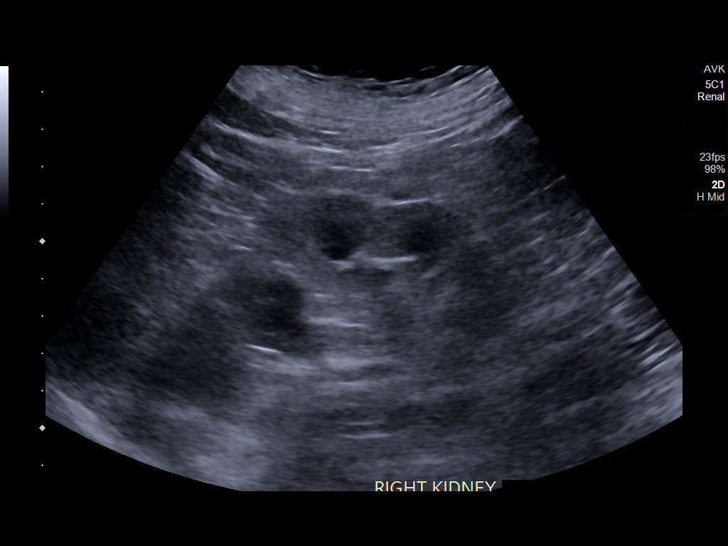
[im 35/65]
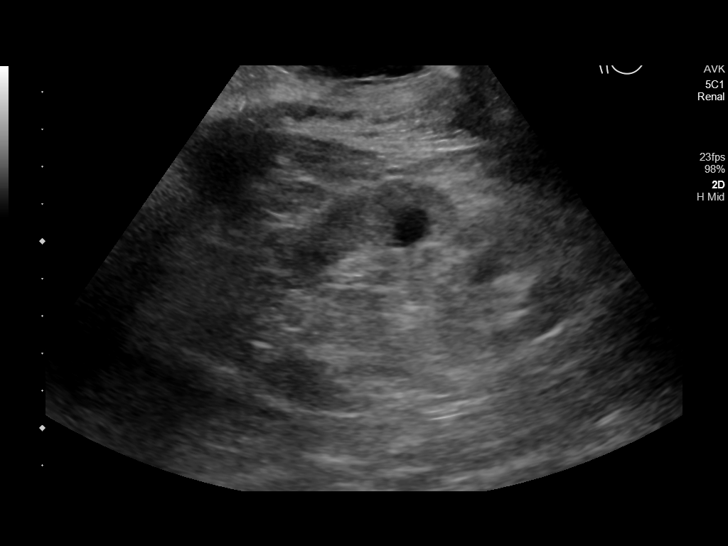
[im 41/65]
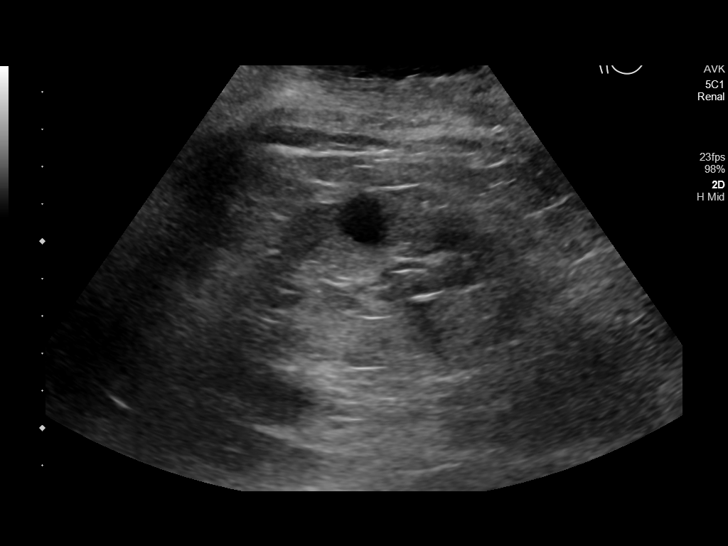
[im 43/65]
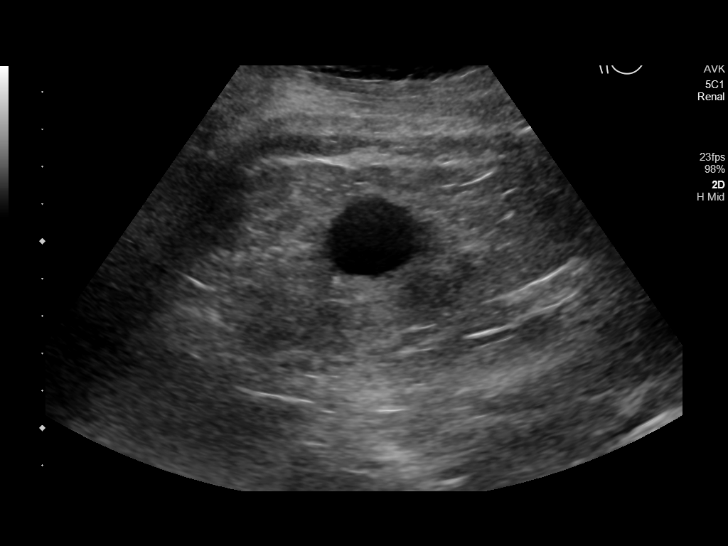
[im 49/65]
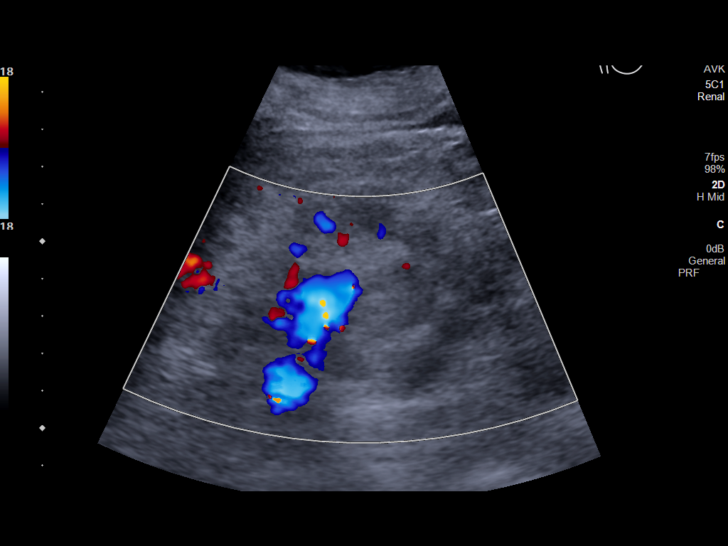
[im 54/65]
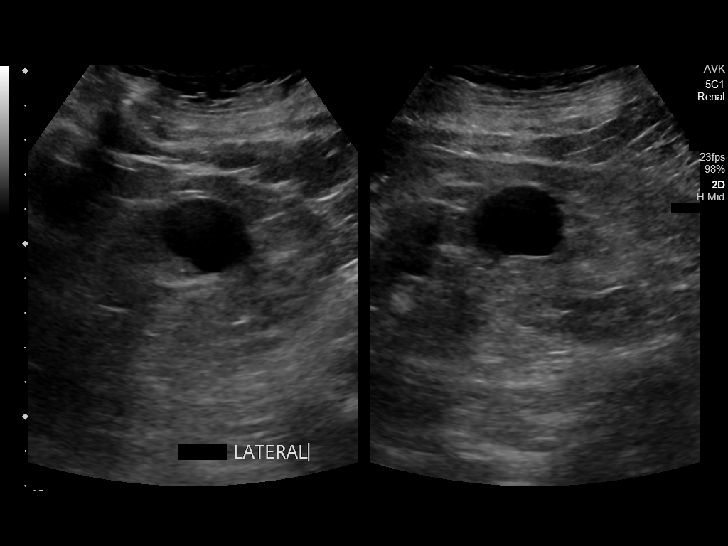
[im 59/65]
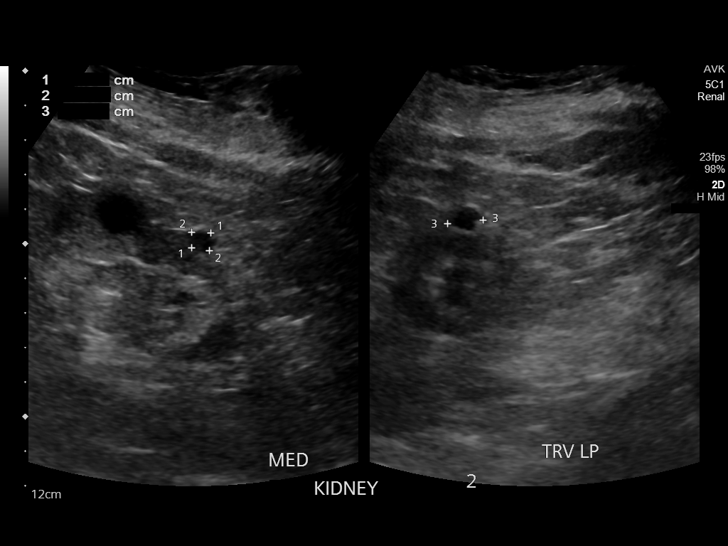
[im 65/65]
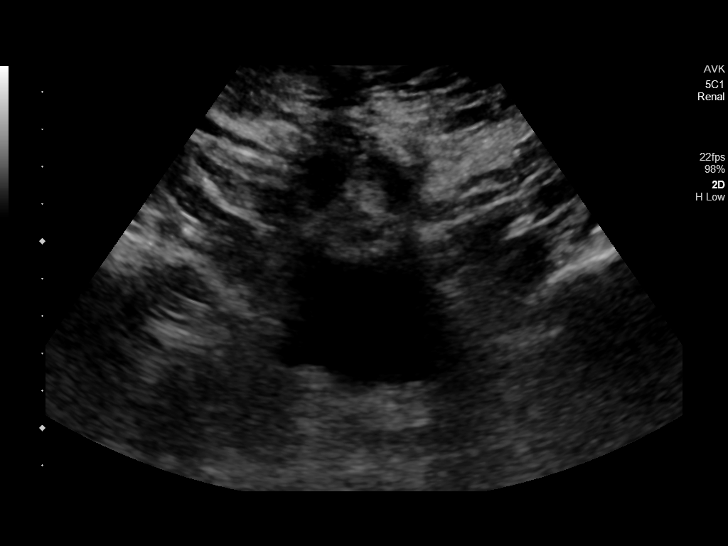

[14 of 25 positions shown; findings below may reference images not displayed]

FINDINGS: Right Kidney:

Renal measurements: 10.1 x 4.6 x 4.3 cm = volume: 104 mL. Cortical
thinning. Increased cortical echogenicity. Mildly lobulated renal
contour. Tiny exophytic cyst at mid kidney 13 x 8 x 8 mm. Additional
tiny cortical cyst at lower pole 9 x 9 x 7 mm. No additional mass or
hydronephrosis. No shadowing calcification.

Left Kidney:

Renal measurements: 9.8 x 5.2 x 4.2 cm = volume: 111 mL. Cortical
thinning. Increased cortical echogenicity with mild fetal
lobulation. For simple cyst mid kidney 2.6 x 2.6 x 2.2 cm.

Tiny cyst at lower pole 10 x 7 x 7 mm. No additional mass,
hydronephrosis or shadowing calcification.

Bladder:

Appears normal for degree of bladder distention.

Other:

N/A
IMPRESSION: BILATERAL renal cortical atrophy and medical renal disease changes.

Tiny BILATERAL renal cysts.

No evidence of additional renal mass or hydronephrosis.

## 2021-07-20 ENCOUNTER — Ambulatory Visit: Payer: Medicare Other

## 2021-07-22 ENCOUNTER — Ambulatory Visit: Payer: Medicare Other | Attending: Internal Medicine

## 2021-07-22 ENCOUNTER — Other Ambulatory Visit: Payer: Self-pay

## 2021-07-22 VITALS — BP 163/112

## 2021-07-22 DIAGNOSIS — R262 Difficulty in walking, not elsewhere classified: Secondary | ICD-10-CM

## 2021-07-22 DIAGNOSIS — M6281 Muscle weakness (generalized): Secondary | ICD-10-CM | POA: Diagnosis present

## 2021-07-22 DIAGNOSIS — M25551 Pain in right hip: Secondary | ICD-10-CM | POA: Diagnosis present

## 2021-07-22 DIAGNOSIS — M25552 Pain in left hip: Secondary | ICD-10-CM

## 2021-07-22 NOTE — Therapy (Signed)
[place] MAIN [facility] SERVICES [address] [place], [place], [address] Phone: [phone]   Fax:  [phone]  Physical Therapy Evaluation  Patient Details  Name: Cassandra [name] MRN: [id number] Date of Birth: April 25, 1927 Referring Provider (PT): Lattie Corns   Encounter Date: 07/22/2021   PT End of Session - 07/22/21 1048     Visit Number 1    Number of Visits 25    Date for PT Re-Evaluation 08/19/21    PT Start Time 1001    PT Stop Time 1059    PT Time Calculation (min) 58 min    Equipment Utilized During Treatment --    Activity Tolerance Treatment limited secondary to medical complications (Comment)   BP 155/105 at rest.   Behavior During Therapy [facility] for tasks assessed/performed             Past Medical History:  Diagnosis Date   Arthritis    osteoarthritis in hands, knees ( R torn cartilage, L meniscus removed)    CAD (coronary artery disease)    Diabetes mellitus without complication ([place])    Glaucoma    Hyperlipidemia    Hypertension    Hypothyroidism     Past Surgical History:  Procedure Laterality Date   ABDOMINAL HYSTERECTOMY     CORONARY ANGIOPLASTY WITH STENT PLACEMENT     FOOT SURGERY Bilateral    KNEE ARTHROSCOPY W/ MENISCECTOMY Left    Also has R knee injury (torn cartilage)     Vitals:   07/22/21 1034 07/22/21 1056  BP: (!) 155/105 (!) 163/112      Subjective Assessment - 07/22/21 1038     Subjective Pt reports she had a fall in her home during the first of August.  Pt notes she was in a brace for 6 weeks over the span of August and September.  Pt reports that her leg was bothering her after the fall, noting significant pain.  Pt saw MD 3 days s/p fall.  Pt reports that her orthopaedic surgeon explained that she broke a bone in her L LE and that it was a bad break due to her age.  She notes she was unable to have surgery due to her age and it was likely going to be difficult heal.  Pt notes she  had another fall 2 weeks ago in which she fell on her buttocks and has been bothering her ever since.  X-Rays were taken yesterday, but no results have been submitted yet or communicated with the pt.  Pt notes she walks at home with furniture/wall cruising and combining it with her walker.    Pertinent History PMH of CAD, HTN, DM 2, HLD, RLS, arthritis, hypothyroidism, anxiety who  fell over a week ago, falling backwards landing on her right buttock.    How long can you sit comfortably? 2 hours    How long can you stand comfortably? 5 minutes    How long can you walk comfortably? 10 minutes    Diagnostic tests X-Rays taken yesterday, but results have not been read.    Patient Stated Goals Get well and take husband on [place] trip in the Spring.    Currently in Pain? Yes    Pain Score 7     Pain Location Hip    Pain Orientation Right;Left    Pain Descriptors / Indicators Aching    Pain Type Acute pain    Pain Onset 1 to 4 weeks ago    Pain Frequency  Constant    Aggravating Factors  Walking, Standing, Lying Down    Pain Relieving Factors Sitting, Medication    Effect of Pain on Daily Activities Pt has difficulty sleeping due to the pain experienced in the hips/LEs.                [facility] PT Assessment - 07/22/21 1045       Assessment   Medical Diagnosis S82.142D (ICD-10-CM) - Displaced bicondylar fracture of left tibia, subsequent encounter for closed fracture with routine healing  S89.92XD (ICD-10-CM) - Unspecified injury of left lower leg, subsequent encounter    Referring Provider (PT) Lattie Corns    Onset Date/Surgical Date 04/25/21    Hand Dominance Right    Next MD Visit 07/30/21    Prior Therapy Yes      Balance Screen   Has the patient fallen in the past 6 months Yes    How many times? 2    Has the patient had a decrease in activity level because of a fear of falling?  Yes    Is the patient reluctant to leave their home because of a fear of falling?  No       Prior Function   Level of Independence Independent with community mobility with device               Strength R/L 4*/4* Hip flexion 4/4 Hip abduction 4/4 Hip adduction 4/4* Knee extension 4/4 Knee flexion *indicates pain     Objective measurements completed on examination: See above findings.   Limited findings due to BP limiting session and ability to perform objective measurements.       PT Education - 07/22/21 1048     Education Details Pt educated on roles of PT and evaluation process.    Person(s) Educated Patient    Methods Explanation;Demonstration    Comprehension Verbalized understanding              PT Short Term Goals - 07/22/21 1101       PT SHORT TERM GOAL #1   Title Pt will perform HEP independently with no difficulties.    Baseline Pt not given HEP at initial evaluation.    Time 4    Period Weeks    Status New    Target Date 08/19/21               PT Long Term Goals - 07/22/21 1102       PT LONG TERM GOAL #1   Title Pt will report worst pain in past week less than 4/10 in order to increase functional mobility and tolerance to household chores.    Baseline 7/10    Time 12    Period Weeks    Target Date 10/14/21                    Plan - 07/22/21 1050     Clinical Impression Statement Pt is pleasant 87 y.o. female seeking skilled physical therapy to address strength concerns following fracture of left tibia discovered after a prior fall.  Pt has since fallen one more time approximately 2 weeks ago and is still awaiting results from X-Ray that was taken yesterday, 07/21/21.  Pt is very anxious and has hx of high BP.  Upon examination of BP, pt was noted to have 155/105 at rest.  This halted majority of functional mobility tests and outcome measurements.  At conclusion of therapy, BP was elevated to 163/112.  Pt  advised to check at home and notify MD/seek medical care if it worsened.  Pt reports her BP is normally high,  but not quite as high as it was during session.  Pt also noted not sleeping since 2 AM this morning, not eating, and not taking her BP meds this AM which could be contributing to elevated BP.  Pt will benefit from skilled therapy to address LE weakness and other deficits including pain and balance related tasks in order to return to PLOF and increase overall QoL.    Personal Factors and Comorbidities Age;Comorbidity 3+    Comorbidities CAD, HTN, DM 2, HLD, RLS, arthritis, hypothyroidism, anxiety    Examination-Activity Limitations Bed Mobility;Bend;Dressing;Lift;Sleep;Squat;Stand;Toileting;Transfers    Examination-Participation Restrictions Church;Driving;Laundry;Meal Prep;Shop    Stability/Clinical Decision Making Stable/Uncomplicated    Clinical Decision Making Low    Rehab Potential Good    PT Frequency 2x / week    PT Duration 12 weeks    PT Treatment/Interventions ADLs/Self Care Home Management;Canalith Repostioning;Electrical Stimulation;Moist Heat;Ultrasound;DME Instruction;Gait training;Stair training;Functional mobility training;Therapeutic activities;Therapeutic exercise;Balance training;Neuromuscular re-education;Passive range of motion;Dry needling;Energy conservation;Joint Manipulations    PT Next Visit Plan Assess balance related outcome measures (TUG, BERG, 5xSTS, etc.) if vitals are ok.    PT Home Exercise Plan Not given today due to BP.    Recommended Other Services seek medical care if BP does not decline once at home, and take BP medication as prescribed.    Consulted and Agree with Plan of Care Patient             Patient will benefit from skilled therapeutic intervention in order to improve the following deficits and impairments:  Abnormal gait, Decreased activity tolerance, Decreased balance, Decreased endurance, Decreased mobility, Decreased strength, Difficulty walking, Pain  Visit Diagnosis: Difficulty in walking, not elsewhere classified  Muscle weakness  (generalized)  Pain in left hip  Pain in right hip     Problem List Patient Active Problem List   Diagnosis Date Noted   Hypoxemia 05/15/2021   Hypoxia 05/14/2021   Hypertensive urgency 05/14/2021   Tibial plateau fracture 04/21/21, left, closed, with routine healing, subsequent encounter 44/31/5400   Acute metabolic encephalopathy 86/76/1950   CKD (chronic kidney disease) stage 4, GFR 15-29 ml/min ([place]) 04/07/2021   Acquired hypothyroidism 12/02/2020   Aortic atherosclerosis ([place]) 10/04/2020   Polycythemia vera ([place]) 06/02/2020   Erythrocytosis 02/19/2020   Thrombocytosis 02/18/2020   Essential hypertension 01/24/2015   Type 2 DM with diabetic neuropathy affecting both sides of body ([place]) 01/24/2015   Restless legs syndrome 01/24/2015   Chronic constipation 01/24/2015   CAD (coronary artery disease), native coronary artery 01/07/2014   Gwenlyn Saran, PT, DPT 07/22/21, 12:46 PM  Christie Nottingham, PT 07/22/2021, 12:43 PM  [place] MAIN [facility] SERVICES [address] [place], [place], [address] Phone: [phone]   Fax:  [phone]  Name: Cassandra [name] MRN: [id number] Date of Birth: 09-22-1927

## 2021-07-26 ENCOUNTER — Ambulatory Visit: Payer: Medicare Other | Attending: Student | Admitting: Physical Therapy

## 2021-07-26 ENCOUNTER — Encounter: Payer: Self-pay | Admitting: Physical Therapy

## 2021-07-26 ENCOUNTER — Other Ambulatory Visit: Payer: Self-pay

## 2021-07-26 VITALS — BP 163/76 | HR 65

## 2021-07-26 DIAGNOSIS — M25551 Pain in right hip: Secondary | ICD-10-CM | POA: Diagnosis present

## 2021-07-26 DIAGNOSIS — R262 Difficulty in walking, not elsewhere classified: Secondary | ICD-10-CM | POA: Diagnosis not present

## 2021-07-26 DIAGNOSIS — M6281 Muscle weakness (generalized): Secondary | ICD-10-CM | POA: Diagnosis present

## 2021-07-26 DIAGNOSIS — M25552 Pain in left hip: Secondary | ICD-10-CM | POA: Insufficient documentation

## 2021-07-27 NOTE — Therapy (Signed)
[place] MAIN [facility] SERVICES [address] [place], [place], [address] Phone: [phone]   Fax:  [phone]  Physical Therapy Treatment  Patient Details  Name: Cassandra [name] MRN: [id number] Date of Birth: 27-Nov-1926 Referring Provider (PT): Lattie Corns   Encounter Date: 07/26/2021   PT End of Session - 07/27/21 0913     Visit Number 2    Number of Visits 25    Date for PT Re-Evaluation 08/19/21    PT Start Time 6144    PT Stop Time 1435    PT Time Calculation (min) 47 min    Equipment Utilized During Treatment Gait belt    Activity Tolerance Patient tolerated treatment well    Behavior During Therapy WFL for tasks assessed/performed             Past Medical History:  Diagnosis Date   Arthritis    osteoarthritis in hands, knees ( R torn cartilage, L meniscus removed)    CAD (coronary artery disease)    Diabetes mellitus without complication ([place])    Glaucoma    Hyperlipidemia    Hypertension    Hypothyroidism     Past Surgical History:  Procedure Laterality Date   ABDOMINAL HYSTERECTOMY     CORONARY ANGIOPLASTY WITH STENT PLACEMENT     FOOT SURGERY Bilateral    KNEE ARTHROSCOPY W/ MENISCECTOMY Left    Also has R knee injury (torn cartilage)     Vitals:   07/26/21 1356  BP: (!) 163/76  Pulse: 65     Subjective Assessment - 07/26/21 1356     Subjective Patient reports increased BLE hip pain (L>R) she reports her left hip hurts in the buttocks and radiates down to knee; She reports her right leg is also hurting in buttocks and down to knee; She did get results of recent x-rays which were negative for fracture.    Pertinent History PMH of CAD, HTN, DM 2, HLD, RLS, arthritis, hypothyroidism, anxiety who  fell over a week ago, falling backwards landing on her right buttock.    How long can you sit comfortably? 2 hours    How long can you stand comfortably? 5 minutes    How long can you walk comfortably? 10  minutes    Diagnostic tests X-Rays taken 10/27: shows arthritis but no recent fracture;    Patient Stated Goals Get well and take husband on [place] trip in the Spring.    Currently in Pain? Yes    Pain Score 7     Pain Location Hip    Pain Orientation Right;Left    Pain Descriptors / Indicators Aching;Sore    Pain Type Acute pain    Pain Onset 1 to 4 weeks ago    Pain Frequency Constant    Aggravating Factors  walking/standing/lying down    Pain Relieving Factors sitting/medication    Effect of Pain on Daily Activities decreased activity tolerance;    Multiple Pain Sites No                [facility] PT Assessment - 07/27/21 0001       Precautions   Precaution Comments no precautions at this time    Required Braces or Orthoses --   none     Standardized Balance Assessment   Standardized Balance Assessment Berg Balance Test;Five Times Sit to Stand;10 meter walk test    Five times sit to stand comments  25.40 sec with arms in lap, >15 sec indicates  high risk for falls; does report some fatigue/dizziness and fear of falling    10 Meter Walk 0.43 m/s with rollator, Limited home ambulator, high risk for falls   BP was 167/70, HR 63 following gait     Berg Balance Test   Sit to Stand Able to stand  independently using hands    Standing Unsupported Able to stand 2 minutes with supervision    Sitting with Back Unsupported but Feet Supported on Floor or Stool Able to sit safely and securely 2 minutes    Stand to Sit Controls descent by using hands    Transfers Able to transfer safely, definite need of hands    Standing Unsupported with Eyes Closed Able to stand 10 seconds with supervision    Standing Unsupported with Feet Together Able to place feet together independently but unable to hold for 30 seconds    From Standing, Reach Forward with Outstretched Arm Can reach forward >12 cm safely (5")    From Standing Position, Pick up Object from Floor Unable to pick up shoe, but reaches 2-5  cm (1-2") from shoe and balances independently    From Standing Position, Turn to Look Behind Over each Shoulder Looks behind one side only/other side shows less weight shift    Turn 360 Degrees Needs assistance while turning    Standing Unsupported, Alternately Place Feet on Step/Stool Able to complete >2 steps/needs minimal assist    Standing Unsupported, One Foot in Front Able to take small step independently and hold 30 seconds    Standing on One Leg Unable to try or needs assist to prevent fall    Total Score 32    Berg comment: 100% risk for falls                TREATMENT: PT instructed patient in outcome measures to assess fall risk, see above  Patient does report high fear of falling being hesitant to stand and do activity without HHA                    PT Education - 07/27/21 0913     Education Details fall risk    Person(s) Educated Patient    Methods Explanation    Comprehension Verbalized understanding              PT Short Term Goals - 07/22/21 1101       PT SHORT TERM GOAL #1   Title Pt will perform HEP independently with no difficulties.    Baseline Pt not given HEP at initial evaluation.    Time 4    Period Weeks    Status New    Target Date 08/19/21               PT Long Term Goals - 07/22/21 1102       PT LONG TERM GOAL #1   Title Pt will report worst pain in past week less than 4/10 in order to increase functional mobility and tolerance to household chores.    Baseline 7/10    Time 12    Period Weeks    Target Date 10/14/21      PT LONG TERM GOAL #2   Title Patient will demonstrate improved function as evidenced by a score of 46 on FOTO measure for full participation in activities at home and in the community.    Baseline FOTO: 25    Time 12    Period Weeks    Status New  Target Date 10/14/21                   Plan - 07/27/21 0914     Clinical Impression Statement Patient motivated and  participated well within session.Recent X-rays were reviewed with no recent fracture, just general arthritis. Patient denies any restrictions or precautions.  She continues to have elevated systolic BP but overall her BP is within functional limits for rehab. PT assessed vitals after gait with no significant change. Patient does report high fear of falling. She also tested as a high fall risk. Patient transfers and ambulates at slower speed also indicating increased risk for falls. At end of session she reports continued RLE posterior hip pain. She would like to address this further as she isn't sure if she should follow up with neurologist for  injection to reduce nerve pain. She would benefit from additional skilled PT Intervention to improve strength, balance and mobility;    Personal Factors and Comorbidities Age;Comorbidity 3+    Comorbidities CAD, HTN, DM 2, HLD, RLS, arthritis, hypothyroidism, anxiety    Examination-Activity Limitations Bed Mobility;Bend;Dressing;Lift;Sleep;Squat;Stand;Toileting;Transfers    Examination-Participation Restrictions Church;Driving;Laundry;Meal Prep;Shop    Stability/Clinical Decision Making Stable/Uncomplicated    Rehab Potential Good    PT Frequency 2x / week    PT Duration 12 weeks    PT Treatment/Interventions ADLs/Self Care Home Management;Canalith Repostioning;Electrical Stimulation;Moist Heat;Ultrasound;DME Instruction;Gait training;Stair training;Functional mobility training;Therapeutic activities;Therapeutic exercise;Balance training;Neuromuscular re-education;Passive range of motion;Dry needling;Energy conservation;Joint Manipulations    PT Next Visit Plan Assess balance related outcome measures (TUG, BERG, 5xSTS, etc.) if vitals are ok.    PT Home Exercise Plan Not given today due to BP.    Consulted and Agree with Plan of Care Patient             Patient will benefit from skilled therapeutic intervention in order to improve the following deficits  and impairments:  Abnormal gait, Decreased activity tolerance, Decreased balance, Decreased endurance, Decreased mobility, Decreased strength, Difficulty walking, Pain  Visit Diagnosis: Difficulty in walking, not elsewhere classified  Muscle weakness (generalized)  Pain in left hip  Pain in right hip     Problem List Patient Active Problem List   Diagnosis Date Noted   Hypoxemia 05/15/2021   Hypoxia 05/14/2021   Hypertensive urgency 05/14/2021   Tibial plateau fracture 04/21/21, left, closed, with routine healing, subsequent encounter 33/29/5188   Acute metabolic encephalopathy 41/66/0630   CKD (chronic kidney disease) stage 4, GFR 15-29 ml/min ([place]) 04/07/2021   Acquired hypothyroidism 12/02/2020   Aortic atherosclerosis ([place]) 10/04/2020   Polycythemia vera ([place]) 06/02/2020   Erythrocytosis 02/19/2020   Thrombocytosis 02/18/2020   Essential hypertension 01/24/2015   Type 2 DM with diabetic neuropathy affecting both sides of body ([place]) 01/24/2015   Restless legs syndrome 01/24/2015   Chronic constipation 01/24/2015   CAD (coronary artery disease), native coronary artery 01/07/2014    Naveen Clardy, PT, DPT 07/27/2021, 9:17 AM  [place] [address] [place], [place], [address] Phone: [phone]   Fax:  [phone]  Name: Cassandra [name] MRN: [id number] Date of Birth: 1927-09-23

## 2021-07-28 ENCOUNTER — Ambulatory Visit: Payer: Medicare Other

## 2021-08-01 ENCOUNTER — Other Ambulatory Visit: Payer: Self-pay | Admitting: Physical Medicine and Rehabilitation

## 2021-08-01 DIAGNOSIS — M5416 Radiculopathy, lumbar region: Secondary | ICD-10-CM

## 2021-08-02 ENCOUNTER — Ambulatory Visit: Payer: Medicare Other

## 2021-08-04 ENCOUNTER — Ambulatory Visit: Payer: Medicare Other | Admitting: Physical Therapy

## 2021-08-09 ENCOUNTER — Ambulatory Visit: Payer: Medicare Other

## 2021-08-09 ENCOUNTER — Other Ambulatory Visit: Payer: Self-pay

## 2021-08-09 ENCOUNTER — Ambulatory Visit
Admission: RE | Admit: 2021-08-09 | Discharge: 2021-08-09 | Disposition: A | Discharge status: Home / Self Care | Payer: Medicare Other | Source: Ambulatory Visit | Attending: Physical Medicine and Rehabilitation | Admitting: Physical Medicine and Rehabilitation

## 2021-08-09 DIAGNOSIS — M5416 Radiculopathy, lumbar region: Secondary | ICD-10-CM | POA: Diagnosis not present

## 2021-08-11 ENCOUNTER — Ambulatory Visit: Payer: Medicare Other | Admitting: Physical Therapy

## 2021-08-16 ENCOUNTER — Ambulatory Visit: Payer: Medicare Other | Admitting: Physical Therapy

## 2021-08-23 ENCOUNTER — Ambulatory Visit: Payer: Medicare Other | Admitting: Physical Therapy

## 2021-08-25 ENCOUNTER — Other Ambulatory Visit: Payer: Self-pay

## 2021-08-25 ENCOUNTER — Emergency Department: Payer: Medicare Other

## 2021-08-25 ENCOUNTER — Emergency Department
Admission: EM | Admit: 2021-08-25 | Discharge: 2021-08-25 | Disposition: A | Discharge status: Home / Self Care | Payer: Medicare Other | Attending: Emergency Medicine | Admitting: Emergency Medicine

## 2021-08-25 ENCOUNTER — Ambulatory Visit: Payer: Medicare Other

## 2021-08-25 ENCOUNTER — Encounter: Payer: Self-pay | Admitting: Emergency Medicine

## 2021-08-25 DIAGNOSIS — Z79899 Other long term (current) drug therapy: Secondary | ICD-10-CM | POA: Diagnosis not present

## 2021-08-25 DIAGNOSIS — R079 Chest pain, unspecified: Secondary | ICD-10-CM

## 2021-08-25 DIAGNOSIS — I129 Hypertensive chronic kidney disease with stage 1 through stage 4 chronic kidney disease, or unspecified chronic kidney disease: Secondary | ICD-10-CM | POA: Insufficient documentation

## 2021-08-25 DIAGNOSIS — I160 Hypertensive urgency: Secondary | ICD-10-CM | POA: Insufficient documentation

## 2021-08-25 DIAGNOSIS — Z20822 Contact with and (suspected) exposure to covid-19: Secondary | ICD-10-CM | POA: Insufficient documentation

## 2021-08-25 DIAGNOSIS — N184 Chronic kidney disease, stage 4 (severe): Secondary | ICD-10-CM | POA: Diagnosis not present

## 2021-08-25 DIAGNOSIS — Z7984 Long term (current) use of oral hypoglycemic drugs: Secondary | ICD-10-CM | POA: Insufficient documentation

## 2021-08-25 DIAGNOSIS — Z87891 Personal history of nicotine dependence: Secondary | ICD-10-CM | POA: Diagnosis not present

## 2021-08-25 DIAGNOSIS — E039 Hypothyroidism, unspecified: Secondary | ICD-10-CM | POA: Diagnosis not present

## 2021-08-25 DIAGNOSIS — E1122 Type 2 diabetes mellitus with diabetic chronic kidney disease: Secondary | ICD-10-CM | POA: Insufficient documentation

## 2021-08-25 DIAGNOSIS — I2510 Atherosclerotic heart disease of native coronary artery without angina pectoris: Secondary | ICD-10-CM | POA: Insufficient documentation

## 2021-08-25 DIAGNOSIS — R0789 Other chest pain: Secondary | ICD-10-CM | POA: Diagnosis present

## 2021-08-25 DIAGNOSIS — Z7982 Long term (current) use of aspirin: Secondary | ICD-10-CM | POA: Insufficient documentation

## 2021-08-25 LAB — BASIC METABOLIC PANEL
Anion gap: 7 (ref 5–15)
BUN: 27 mg/dL — ABNORMAL HIGH (ref 8–23)
CO2: 22 mmol/L (ref 22–32)
Calcium: 9.1 mg/dL (ref 8.9–10.3)
Chloride: 106 mmol/L (ref 98–111)
Creatinine, Ser: 2.13 mg/dL — ABNORMAL HIGH (ref 0.44–1.00)
GFR, Estimated: 21 mL/min — ABNORMAL LOW (ref >60)
Glucose, Bld: 108 mg/dL — ABNORMAL HIGH (ref 70–99)
Potassium: 4.3 mmol/L (ref 3.5–5.1)
Sodium: 135 mmol/L (ref 135–145)

## 2021-08-25 LAB — CBC
HCT: 55.7 % — ABNORMAL HIGH (ref 36.0–46.0)
Hemoglobin: 17.4 g/dL — ABNORMAL HIGH (ref 12.0–15.0)
MCH: 28.3 pg (ref 26.0–34.0)
MCHC: 31.2 g/dL (ref 30.0–36.0)
MCV: 90.7 fL (ref 80.0–100.0)
Platelets: 571 10*3/uL — ABNORMAL HIGH (ref 150–400)
RBC: 6.14 MIL/uL — ABNORMAL HIGH (ref 3.87–5.11)
RDW: 21.2 % — ABNORMAL HIGH (ref 11.5–15.5)
WBC: 18.5 10*3/uL — ABNORMAL HIGH (ref 4.0–10.5)
nRBC: 0 % (ref 0.0–0.2)

## 2021-08-25 LAB — RESP PANEL BY RT-PCR (FLU A&B, COVID) ARPGX2
Influenza A by PCR: NEGATIVE
Influenza B by PCR: NEGATIVE
SARS Coronavirus 2 by RT PCR: NEGATIVE

## 2021-08-25 LAB — TROPONIN I (HIGH SENSITIVITY)
Troponin I (High Sensitivity): 12 ng/L (ref <18)
Troponin I (High Sensitivity): 13 ng/L (ref <18)

## 2021-08-25 LAB — PROCALCITONIN: Procalcitonin: <0.1 ng/mL

## 2021-08-25 MED ORDER — CLONIDINE HCL 0.1 MG PO TABS: 0.1000 mg | ORAL_TABLET | Freq: Every day | ORAL | 0 refills | Status: DC | PRN

## 2021-08-25 MED ORDER — METOPROLOL SUCCINATE ER 50 MG PO TB24
50.0000 mg | ORAL_TABLET | Freq: Every day | ORAL | Status: DC
  Administered 2021-08-25: 50 mg via ORAL
  Filled 2021-08-25: qty 1

## 2021-08-25 MED ORDER — CLONIDINE HCL 0.1 MG PO TABS
0.1000 mg | ORAL_TABLET | Freq: Every day | ORAL | Status: DC | PRN
  Administered 2021-08-25: 0.1 mg via ORAL
  Filled 2021-08-25: qty 1

## 2021-08-25 NOTE — ED Triage Notes (Signed)
Chest pain mid sternal that began last night has had 324mg  of ASA, is having PVCs during transport. Pt brought in by [facility] from home. Denies any SHOB, N/V or diaphoreses. Pain has subsided.

## 2021-08-25 NOTE — ED Provider Notes (Signed)
[facility] Emergency Department Provider Note  ____________________________________________   Event Date/Time   First MD Initiated Contact with Patient 08/25/21 1123     (approximate)  I have reviewed the triage vital signs and the nursing notes.   HISTORY  Chief Complaint No chief complaint on file.   HPI Cassandra [name] is a 87 y.o. female history of CAD, DM, hiatal hernia, HTN, atrial flutter for thyroidism pruritus as well as CKD who presents for assessment of some chest discomfort that started last night and was bothering her overnight but seem to get better about time spent emergency room today.  She states she felt like she had a burger earlier but this episode subsided.  She denies any headache, earache, sore throat, chest pain, cough, shortness of breath, back pain, pain, nausea, vomiting, diarrhea, urination, rash or extremity pain.  Was not clearly exertional or positional.  She states no bit of diarrhea.  Last night but this is SUBSIDED.  Patient notes she had had a tick in her jaw last week but this is since resolved.  No other acute sick symptoms.  She is requesting a refill of her clonidine.  She has not taken her metoprolol this morning.         Past Medical History:  Diagnosis Date   Arthritis    osteoarthritis in hands, knees ( R torn cartilage, L meniscus removed)    CAD (coronary artery disease)    Diabetes mellitus without complication ([place])    Glaucoma    Hyperlipidemia    Hypertension    Hypothyroidism     Patient Active Problem List   Diagnosis Date Noted   Hypoxemia 05/15/2021   Hypoxia 05/14/2021   Hypertensive urgency 05/14/2021   Tibial plateau fracture 04/21/21, left, closed, with routine healing, subsequent encounter 93/79/0240   Acute metabolic encephalopathy 97/35/3299   CKD (chronic kidney disease) stage 4, GFR 15-29 ml/min ([place]) 04/07/2021   Acquired hypothyroidism 12/02/2020   Aortic atherosclerosis ([place])  10/04/2020   Polycythemia vera ([place]) 06/02/2020   Erythrocytosis 02/19/2020   Thrombocytosis 02/18/2020   Essential hypertension 01/24/2015   Type 2 DM with diabetic neuropathy affecting both sides of body ([place]) 01/24/2015   Restless legs syndrome 01/24/2015   Chronic constipation 01/24/2015   CAD (coronary artery disease), native coronary artery 01/07/2014    Past Surgical History:  Procedure Laterality Date   ABDOMINAL HYSTERECTOMY     CORONARY ANGIOPLASTY WITH STENT PLACEMENT     FOOT SURGERY Bilateral    KNEE ARTHROSCOPY W/ MENISCECTOMY Left    Also has R knee injury (torn cartilage)     Prior to Admission medications   Medication Sig Start Date End Date Taking? Authorizing Provider  albuterol (VENTOLIN HFA) 108 (90 Base) MCG/ACT inhaler Inhale into the lungs. Patient not taking: No sig reported 03/20/17   [provider]  aspirin EC 81 MG tablet Take 1 tablet by mouth daily.    [provider]  azelastine (ASTELIN) 0.1 % nasal spray Place 1 spray into both nostrils 2 (two) times daily.    [provider]  benazepril (LOTENSIN) 40 MG tablet Take 1 tablet (40 mg total) by mouth daily. Patient not taking: Reported on 05/15/2021 01/21/15   Crecencio Mc, MD  cetirizine (ZYRTEC) 10 MG tablet Take 1 tablet by mouth daily.    [provider]  Cholecalciferol 25 MCG (1000 UT) tablet Take 1,000 Units by mouth daily. 04/26/17   [provider]  cloNIDine (CATAPRES) 0.1  MG tablet Take 1 tablet (0.1 mg total) by mouth daily as needed (Systolic blood pressure 620 or higher). 08/25/21 09/24/21  Lucrezia Starch, MD  DROXIA 300 MG capsule TAKE 1 CAPSULE (300 MG TOTAL) BY MOUTH DAILY. MAY TAKE WITH FOOD TO MINIMIZE GI SIDE EFFECTS. 04/23/20   Verlon Au, NP  gabapentin (NEURONTIN) 300 MG capsule Take 1 capsule by mouth 3 (three) times daily.  01/05/15 07/21/21  [provider]  glimepiride (AMARYL) 2 MG tablet Take 0.5 tablets by mouth daily.  Take 0.5 tablet daily with largest meal Patient not taking: Reported on 05/15/2021 12/08/14   [provider]  glucose blood test strip Use once daily. Use as instructed. 02/21/17   [provider]  HYDROcodone-acetaminophen (NORCO/VICODIN) 5-325 MG tablet 1/2 po bid prn Earliest Fill Date: 07/19/17 07/19/17   [provider]  latanoprost (XALATAN) 0.005 % ophthalmic solution Place 1 drop into both eyes at bedtime. 09/13/15   [provider]  levothyroxine (SYNTHROID) 88 MCG tablet Take 88 mcg by mouth daily. Take on an empty stomach 30 to 60 minutes before breakfast 12/08/14   [provider]  LOTREL 5-20 MG capsule Take 1 capsule by mouth daily. 07/03/17   [provider]  meloxicam (MOBIC) 15 MG tablet TAKE 1 TABLET (15 MG TOTAL) BY MOUTH DAILY WITH BREAKFAST. Patient not taking: Reported on 05/15/2021 06/05/17   [provider]  metoprolol succinate (TOPROL-XL) 50 MG 24 hr tablet Take 1 tablet by mouth daily. 11/02/14   [provider]  rOPINIRole (REQUIP) 0.25 MG tablet Take 0.25 mg by mouth at bedtime. 04/22/21   [provider]  rosuvastatin (CRESTOR) 20 MG tablet Take 20 mg by mouth daily. 01/07/15   [provider]  sertraline (ZOLOFT) 50 MG tablet Take 50 mg by mouth daily. Patient not taking: Reported on 05/15/2021 05/03/21   [provider]  timolol (TIMOPTIC-XR) 0.5 % ophthalmic gel-forming Place 1 drop into both eyes every morning. 05/06/21   [provider]    Allergies Atorvastatin, Cyclobenzaprine, Hydrocodone-acetaminophen, Mirtazapine, Oxycodone-acetaminophen, Paroxetine hcl, Penicillins, Propoxyphene, and Trazodone  Family History  Problem Relation Age of Onset   Hypertension Mother    Hypertension Father    Early death Father    Heart disease Father     Social History Social History   Tobacco Use   Smoking status: Former    Types: Cigarettes    Quit date: 01/21/1980     Years since quitting: 41.6   Smokeless tobacco: Never  Substance Use Topics   Alcohol use: Yes    Alcohol/week: 0.0 standard drinks    Comment: occasionally   Drug use: No    Review of Systems  Review of Systems  Constitutional:  Negative for chills and fever.  HENT:  Negative for sore throat.   Eyes:  Negative for pain.  Respiratory:  Negative for cough and stridor.   Cardiovascular:  Positive for chest pain (resolved prior to arrival).  Gastrointestinal:  Negative for vomiting.  Genitourinary:  Negative for dysuria.  Musculoskeletal:  Negative for myalgias.  Skin:  Negative for rash.  Neurological:  Negative for seizures, loss of consciousness and headaches.  Psychiatric/Behavioral:  Negative for suicidal ideas.   All other systems reviewed and are negative.    ____________________________________________   PHYSICAL EXAM:  VITAL SIGNS: ED Triage Vitals [08/25/21 0737]  Enc Vitals Group     BP      Pulse      Resp  Temp      Temp src      SpO2      Weight 146 lb (66.2 kg)     Height 5\' 1"  (1.549 m)     Head Circumference      Peak Flow      Pain Score 5     Pain Loc      Pain Edu?      Excl. in [place]?    Vitals:   08/25/21 1244 08/25/21 1304  BP: (!) 175/78 (!) 171/74  Pulse:  67  Resp:  18  Temp:    SpO2:  94%   Physical Exam Vitals and nursing note reviewed.  Constitutional:      General: She is not in acute distress.    Appearance: She is well-developed.  HENT:     Head: Normocephalic and atraumatic.     Right Ear: External ear normal.     Left Ear: External ear normal.     Nose: Nose normal.  Eyes:     Conjunctiva/sclera: Conjunctivae normal.  Cardiovascular:     Rate and Rhythm: Normal rate and regular rhythm.     Heart sounds: No murmur heard. Pulmonary:     Effort: Pulmonary effort is normal. No respiratory distress.     Breath sounds: Normal breath sounds.  Abdominal:     Palpations: Abdomen is soft.     Tenderness: There is no  abdominal tenderness.  Musculoskeletal:        General: No swelling.     Cervical back: Neck supple.  Skin:    General: Skin is warm and dry.     Capillary Refill: Capillary refill takes less than 2 seconds.  Neurological:     Mental Status: She is alert.  Psychiatric:        Mood and Affect: Mood normal.     ____________________________________________   LABS (all labs ordered are listed, but only abnormal results are displayed)  Labs Reviewed  CBC - Abnormal; Notable for the following components:      Result Value   WBC 18.5 (*)    RBC 6.14 (*)    Hemoglobin 17.4 (*)    HCT 55.7 (*)    RDW 21.2 (*)    Platelets 571 (*)    All other components within normal limits  BASIC METABOLIC PANEL - Abnormal; Notable for the following components:   Glucose, Bld 108 (*)    BUN 27 (*)    Creatinine, Ser 2.13 (*)    GFR, Estimated 21 (*)    All other components within normal limits  RESP PANEL BY RT-PCR (FLU A&B, COVID) ARPGX2  PROCALCITONIN  TROPONIN I (HIGH SENSITIVITY)  TROPONIN I (HIGH SENSITIVITY)   ____________________________________________  EKG  Sinus rhythm with PAC, left axis deviation, nonspecific ST changes versus artifact concerns wanted me to without evidence of acute ischemia or significant other arrhythmia. ____________________________________________  RADIOLOGY  ED MD interpretation: Chest x-ray shows no focal consolidation, effusion, pneumothorax, clear to thoracic process.  There is evidence of aortic atherosclerosis.   Official radiology report(s): DG Chest 2 View  Result Date: 08/25/2021 CLINICAL DATA:  Chest pain. EXAM: CHEST - 2 VIEW COMPARISON:  May 14, 2021. FINDINGS: Stable cardiomegaly. Stable bibasilar densities are noted concerning for scarring. No definite acute pulmonary abnormality is noted. Bony thorax is unremarkable. IMPRESSION: No active cardiopulmonary disease. Aortic Atherosclerosis (ICD10-I70.0). Electronically Signed   By: Marijo Conception M.D.   On: 08/25/2021 08:16    ____________________________________________  PROCEDURES  Procedure(s) performed (including Critical Care):  Procedures   ____________________________________________   INITIAL IMPRESSION / ASSESSMENT AND PLAN / ED COURSE      Patient presents with above-stated history exam for assessment of some chest pain that started last night resolved prior to arrival.  Patient had little diaphoresis with this but no other clear associated sick symptoms.  Is requesting refill on her Ventricular metoprolol this morning.  On arrival she was quite hypertensive with a BP of 202, stable vital signs on room air.  Includes ACS, arrhythmia, PE, hypertensive urgency, anemia, bronchitis, pneumonia, metabolic derangements and chest wall etiologies.  There is also swelling possible patient has symptomatic hiatal hernia versus esophageal spasm.  Sinus rhythm with PAC, left axis deviation, nonspecific ST changes versus artifact concerns wanted me to without evidence of acute ischemia or significant other arrhythmia.  Given nonelevated troponin x2 Evalose patient for ACS or myocarditis.  Chest x-ray shows no focal consolidation, effusion, pneumothorax, clear to thoracic process.  There is evidence of aortic atherosclerosis.   CBC shows no evidence of acute anemia.  Patient does have leukocytosis of 18.5 kg has no fever today or for consolidation to suggest bacterial pneumonia.  No other obvious foci of infection on exam procalcitonin undetectable.  BMP significant electrolyte metabolic derangement and kidney function at baseline.  COVID influenza PCR is negative.  Suspicion for PE as patient is denying any chest pain shortness of breath on arrival and has no evidence of tachycardia, tachypnea hypoxia or increased work of breathing or other varus factors.  Patient was given a dose of her clonidine metoprolol improvement in her blood pressure and was observed for a little  over 5 hours without return of any symptoms.  Given stable vitals with improvement of blood pressure and otherwise reassuring exam work-up I think she is stable for discharge close outpatient PCP follow-up.  Discharged in stable condition.  Strict return precautions advised and discussed.      ____________________________________________   FINAL CLINICAL IMPRESSION(S) / ED DIAGNOSES  Final diagnoses:  Chest pain, unspecified type  Hypertensive urgency    Medications  cloNIDine (CATAPRES) tablet 0.1 mg (0.1 mg Oral Given 08/25/21 1223)  metoprolol succinate (TOPROL-XL) 24 hr tablet 50 mg (50 mg Oral Given 08/25/21 1312)     ED Discharge Orders          Ordered    cloNIDine (CATAPRES) 0.1 MG tablet  Daily PRN        08/25/21 1316             Note:  This document was prepared using Dragon voice recognition software and may include unintentional dictation errors.    Lucrezia Starch, MD 08/25/21 [phone]

## 2021-08-30 ENCOUNTER — Ambulatory Visit: Payer: Medicare Other | Attending: Student

## 2021-09-01 ENCOUNTER — Ambulatory Visit: Payer: Medicare Other

## 2021-09-06 ENCOUNTER — Ambulatory Visit: Payer: Medicare Other

## 2021-09-08 ENCOUNTER — Ambulatory Visit: Payer: Medicare Other

## 2021-09-13 ENCOUNTER — Ambulatory Visit: Payer: Medicare Other

## 2021-09-15 ENCOUNTER — Ambulatory Visit: Payer: Medicare Other

## 2021-09-20 ENCOUNTER — Ambulatory Visit: Payer: Medicare Other | Admitting: Physical Therapy

## 2021-09-22 ENCOUNTER — Ambulatory Visit: Payer: Medicare Other

## 2021-10-02 ENCOUNTER — Other Ambulatory Visit: Payer: Self-pay

## 2021-10-02 ENCOUNTER — Encounter: Payer: Self-pay | Admitting: Emergency Medicine

## 2021-10-02 ENCOUNTER — Inpatient Hospital Stay
Admission: EM | Admit: 2021-10-02 | Discharge: 2021-10-05 | DRG: 378 | Disposition: A | Discharge status: Home / Self Care | Payer: Medicare Other | Attending: Internal Medicine | Admitting: Internal Medicine

## 2021-10-02 ENCOUNTER — Emergency Department: Payer: Medicare Other

## 2021-10-02 DIAGNOSIS — Z888 Allergy status to other drugs, medicaments and biological substances status: Secondary | ICD-10-CM | POA: Diagnosis not present

## 2021-10-02 DIAGNOSIS — K625 Hemorrhage of anus and rectum: Secondary | ICD-10-CM | POA: Diagnosis present

## 2021-10-02 DIAGNOSIS — I2510 Atherosclerotic heart disease of native coronary artery without angina pectoris: Secondary | ICD-10-CM | POA: Diagnosis present

## 2021-10-02 DIAGNOSIS — K5731 Diverticulosis of large intestine without perforation or abscess with bleeding: Principal | ICD-10-CM | POA: Diagnosis present

## 2021-10-02 DIAGNOSIS — Z79899 Other long term (current) drug therapy: Secondary | ICD-10-CM | POA: Diagnosis not present

## 2021-10-02 DIAGNOSIS — Z9049 Acquired absence of other specified parts of digestive tract: Secondary | ICD-10-CM

## 2021-10-02 DIAGNOSIS — Z8249 Family history of ischemic heart disease and other diseases of the circulatory system: Secondary | ICD-10-CM

## 2021-10-02 DIAGNOSIS — E785 Hyperlipidemia, unspecified: Secondary | ICD-10-CM | POA: Diagnosis present

## 2021-10-02 DIAGNOSIS — H409 Unspecified glaucoma: Secondary | ICD-10-CM | POA: Diagnosis present

## 2021-10-02 DIAGNOSIS — Z880 Allergy status to penicillin: Secondary | ICD-10-CM

## 2021-10-02 DIAGNOSIS — N184 Chronic kidney disease, stage 4 (severe): Secondary | ICD-10-CM | POA: Diagnosis present

## 2021-10-02 DIAGNOSIS — G2581 Restless legs syndrome: Secondary | ICD-10-CM | POA: Diagnosis present

## 2021-10-02 DIAGNOSIS — D45 Polycythemia vera: Secondary | ICD-10-CM | POA: Diagnosis present

## 2021-10-02 DIAGNOSIS — I129 Hypertensive chronic kidney disease with stage 1 through stage 4 chronic kidney disease, or unspecified chronic kidney disease: Secondary | ICD-10-CM | POA: Diagnosis present

## 2021-10-02 DIAGNOSIS — E1122 Type 2 diabetes mellitus with diabetic chronic kidney disease: Secondary | ICD-10-CM | POA: Diagnosis present

## 2021-10-02 DIAGNOSIS — Z885 Allergy status to narcotic agent status: Secondary | ICD-10-CM | POA: Diagnosis not present

## 2021-10-02 DIAGNOSIS — K922 Gastrointestinal hemorrhage, unspecified: Secondary | ICD-10-CM | POA: Diagnosis present

## 2021-10-02 DIAGNOSIS — Z87891 Personal history of nicotine dependence: Secondary | ICD-10-CM

## 2021-10-02 DIAGNOSIS — M19042 Primary osteoarthritis, left hand: Secondary | ICD-10-CM | POA: Diagnosis present

## 2021-10-02 DIAGNOSIS — Z90710 Acquired absence of both cervix and uterus: Secondary | ICD-10-CM

## 2021-10-02 DIAGNOSIS — E039 Hypothyroidism, unspecified: Secondary | ICD-10-CM | POA: Diagnosis present

## 2021-10-02 DIAGNOSIS — Z955 Presence of coronary angioplasty implant and graft: Secondary | ICD-10-CM

## 2021-10-02 DIAGNOSIS — M19041 Primary osteoarthritis, right hand: Secondary | ICD-10-CM | POA: Diagnosis present

## 2021-10-02 DIAGNOSIS — I10 Essential (primary) hypertension: Secondary | ICD-10-CM | POA: Diagnosis present

## 2021-10-02 DIAGNOSIS — Z7984 Long term (current) use of oral hypoglycemic drugs: Secondary | ICD-10-CM

## 2021-10-02 DIAGNOSIS — Z20822 Contact with and (suspected) exposure to covid-19: Secondary | ICD-10-CM | POA: Diagnosis present

## 2021-10-02 DIAGNOSIS — K921 Melena: Secondary | ICD-10-CM | POA: Diagnosis present

## 2021-10-02 DIAGNOSIS — D75839 Thrombocytosis, unspecified: Secondary | ICD-10-CM

## 2021-10-02 DIAGNOSIS — D75838 Other thrombocytosis: Secondary | ICD-10-CM | POA: Diagnosis present

## 2021-10-02 DIAGNOSIS — Z7982 Long term (current) use of aspirin: Secondary | ICD-10-CM

## 2021-10-02 DIAGNOSIS — Z79890 Hormone replacement therapy (postmenopausal): Secondary | ICD-10-CM

## 2021-10-02 LAB — CBC
HCT: 52.3 % — ABNORMAL HIGH (ref 36.0–46.0)
HCT: 48.9 % — ABNORMAL HIGH (ref 36.0–46.0)
Hemoglobin: 16.1 g/dL — ABNORMAL HIGH (ref 12.0–15.0)
Hemoglobin: 14.9 g/dL (ref 12.0–15.0)
MCH: 27.9 pg (ref 26.0–34.0)
MCH: 28.1 pg (ref 26.0–34.0)
MCHC: 30.8 g/dL (ref 30.0–36.0)
MCHC: 30.5 g/dL (ref 30.0–36.0)
MCV: 90.5 fL (ref 80.0–100.0)
MCV: 92.3 fL (ref 80.0–100.0)
Platelets: 797 10*3/uL — ABNORMAL HIGH (ref 150–400)
Platelets: 721 10*3/uL — ABNORMAL HIGH (ref 150–400)
RBC: 5.78 MIL/uL — ABNORMAL HIGH (ref 3.87–5.11)
RBC: 5.3 MIL/uL — ABNORMAL HIGH (ref 3.87–5.11)
RDW: 19.3 % — ABNORMAL HIGH (ref 11.5–15.5)
RDW: 19 % — ABNORMAL HIGH (ref 11.5–15.5)
WBC: 21.5 10*3/uL — ABNORMAL HIGH (ref 4.0–10.5)
WBC: 19.7 10*3/uL — ABNORMAL HIGH (ref 4.0–10.5)
nRBC: 0 % (ref 0.0–0.2)
nRBC: 0 % (ref 0.0–0.2)

## 2021-10-02 LAB — URINALYSIS, ROUTINE W REFLEX MICROSCOPIC: Specific Gravity, Urine: 1.017 (ref 1.005–1.030)

## 2021-10-02 LAB — URINALYSIS, COMPLETE (UACMP) WITH MICROSCOPIC
RBC / HPF: >50 RBC/hpf (ref 0–5)
Specific Gravity, Urine: 1.017 (ref 1.005–1.030)
Squamous Epithelial / HPF: NONE SEEN (ref 0–5)
WBC, UA: >50 WBC/hpf (ref 0–5)

## 2021-10-02 LAB — COMPREHENSIVE METABOLIC PANEL
ALT: 15 U/L (ref 0–44)
AST: 20 U/L (ref 15–41)
Albumin: 2.8 g/dL — ABNORMAL LOW (ref 3.5–5.0)
Alkaline Phosphatase: 52 U/L (ref 38–126)
Anion gap: 6 (ref 5–15)
BUN: 43 mg/dL — ABNORMAL HIGH (ref 8–23)
CO2: 23 mmol/L (ref 22–32)
Calcium: 8.3 mg/dL — ABNORMAL LOW (ref 8.9–10.3)
Chloride: 108 mmol/L (ref 98–111)
Creatinine, Ser: 2.53 mg/dL — ABNORMAL HIGH (ref 0.44–1.00)
GFR, Estimated: 17 mL/min — ABNORMAL LOW (ref >60)
Glucose, Bld: 92 mg/dL (ref 70–99)
Potassium: 4.2 mmol/L (ref 3.5–5.1)
Sodium: 137 mmol/L (ref 135–145)
Total Bilirubin: 0.3 mg/dL (ref 0.3–1.2)
Total Protein: 6.6 g/dL (ref 6.5–8.1)

## 2021-10-02 LAB — TYPE AND SCREEN
ABO/RH(D): O POS
Antibody Screen: NEGATIVE

## 2021-10-02 MED ORDER — LATANOPROST 0.005 % OP SOLN
1.0000 [drp] | Freq: Every day | OPHTHALMIC | Status: DC
  Administered 2021-10-03 – 2021-10-04 (×2): 1 [drp] via OPHTHALMIC
  Filled 2021-10-02 (×2): qty 2.5

## 2021-10-02 MED ORDER — HYDRALAZINE HCL 10 MG PO TABS
10.0000 mg | ORAL_TABLET | Freq: Three times a day (TID) | ORAL | Status: DC
  Administered 2021-10-02 – 2021-10-03 (×2): 10 mg via ORAL
  Filled 2021-10-02 (×4): qty 1

## 2021-10-02 MED ORDER — LACTATED RINGERS IV SOLN: INTRAVENOUS | Status: DC

## 2021-10-02 MED ORDER — PANTOPRAZOLE SODIUM 40 MG IV SOLR
40.0000 mg | Freq: Two times a day (BID) | INTRAVENOUS | Status: DC
  Administered 2021-10-02 – 2021-10-05 (×6): 40 mg via INTRAVENOUS
  Filled 2021-10-02 (×6): qty 40

## 2021-10-02 MED ORDER — LORATADINE 10 MG PO TABS: 10.0000 mg | ORAL_TABLET | Freq: Every day | ORAL | Status: DC | PRN

## 2021-10-02 MED ORDER — ROPINIROLE HCL 0.25 MG PO TABS
0.2500 mg | ORAL_TABLET | Freq: Every day | ORAL | Status: DC
  Administered 2021-10-03: 0.25 mg via ORAL
  Filled 2021-10-02: qty 1

## 2021-10-02 MED ORDER — TIMOLOL MALEATE 0.5 % OP SOLN
1.0000 [drp] | Freq: Every morning | OPHTHALMIC | Status: DC
  Administered 2021-10-04 – 2021-10-05 (×2): 1 [drp] via OPHTHALMIC
  Filled 2021-10-02 (×2): qty 5

## 2021-10-02 MED ORDER — AZELASTINE HCL 0.1 % NA SOLN
1.0000 | Freq: Two times a day (BID) | NASAL | Status: DC
  Administered 2021-10-04 – 2021-10-05 (×2): 1 via NASAL
  Filled 2021-10-02 (×2): qty 30

## 2021-10-02 MED ORDER — ONDANSETRON HCL 4 MG/2ML IJ SOLN
4.0000 mg | Freq: Four times a day (QID) | INTRAMUSCULAR | Status: DC | PRN
Start: 2021-10-02 — End: 2021-10-05

## 2021-10-02 MED ORDER — ROSUVASTATIN CALCIUM 10 MG PO TABS
20.0000 mg | ORAL_TABLET | Freq: Every day | ORAL | Status: DC
  Filled 2021-10-02: qty 2

## 2021-10-02 MED ORDER — SODIUM CHLORIDE 0.9% FLUSH
3.0000 mL | Freq: Two times a day (BID) | INTRAVENOUS | Status: DC
  Administered 2021-10-02: 3 mL via INTRAVENOUS

## 2021-10-02 MED ORDER — ONDANSETRON HCL 4 MG PO TABS: 4.0000 mg | ORAL_TABLET | Freq: Four times a day (QID) | ORAL | Status: DC | PRN

## 2021-10-02 MED ORDER — MORPHINE SULFATE (PF) 2 MG/ML IV SOLN
2.0000 mg | INTRAVENOUS | Status: DC | PRN
  Administered 2021-10-03 – 2021-10-05 (×2): 2 mg via INTRAVENOUS
  Filled 2021-10-02 (×2): qty 1

## 2021-10-02 MED ORDER — CLONIDINE HCL 0.1 MG/24HR TD PTWK
0.1000 mg | MEDICATED_PATCH | TRANSDERMAL | Status: DC
  Administered 2021-10-02: 0.1 mg via TRANSDERMAL
  Filled 2021-10-02: qty 1

## 2021-10-02 MED ORDER — LEVOTHYROXINE SODIUM 88 MCG PO TABS
88.0000 ug | ORAL_TABLET | Freq: Every day | ORAL | Status: DC
  Administered 2021-10-03 – 2021-10-05 (×3): 88 ug via ORAL
  Filled 2021-10-02 (×3): qty 1

## 2021-10-02 MED ORDER — HYDRALAZINE HCL 20 MG/ML IJ SOLN
5.0000 mg | INTRAMUSCULAR | Status: DC | PRN
  Administered 2021-10-03: 5 mg via INTRAVENOUS
  Filled 2021-10-02: qty 1

## 2021-10-02 NOTE — ED Triage Notes (Signed)
Pt in via EMS from home with c/o rectal bleeding. EMS reports started this am and the blood is bright red. FSBS 97. Pt denies pain, SOB, dizziness or other sx;s. #20g to left ac and pt takes asa daily

## 2021-10-02 NOTE — ED Provider Triage Note (Signed)
Emergency Medicine Provider Triage Evaluation Note  Cassandra [name] , a 88 y.o. female  was evaluated in triage.  Pt complains of sudden onset BRBPR.  Started this morning.  Slight RLQ pain which has improved.  On baby aspirin daily.  No prior history of GI bleed in the past.  No weakness, pallor, shortness of breath or feelings of syncope.  Review of Systems  Positive: BRBPR, RLQ abdominal pain Negative: Weakness, pallor, shortness of breath or feelings of syncope  Physical Exam  BP (!) 175/97 (BP Location: Left Arm)    Pulse 70    Temp 97.8 F (36.6 C) (Oral)    Resp 20    Ht 5' (1.524 m)    Wt 65.8 kg    SpO2 91%    BMI 28.32 kg/m  Gen:   Awake, no distress   Resp:  Normal effort  Cardio:  RRR Abd:  Mild tenderness in RLQ, no rebound tenderness  Medical Decision Making  Medically screening exam initiated at 12:19 PM.  Appropriate orders placed.  Cassandra [name] was informed that the remainder of the evaluation will be completed by another provider, this initial triage assessment does not replace that evaluation, and the importance of remaining in the ED until their evaluation is complete.  BRBPR:  CBC, CMET NPO Consider CT abd pending labs   Jearld Fenton, NP 10/02/21 1223

## 2021-10-02 NOTE — ED Notes (Signed)
Pt ambulatory to the restroom with a steady gait. Pt with BRBPR. MD at the bedside.

## 2021-10-02 NOTE — H&P (Signed)
History and Physical    Cassandra [name] BZJ:[id number] DOB: 21-May-1927 DOA: 10/02/2021  PCP: Rusty Aus, MD    Patient coming from:  Home    Chief Complaint:  Rectal bleeding.   HPI:  Cassandra [name] is a 88 y.o. female seen in ed with complaints of Rectal bleeding, that started today morning , repeat episode of rectal bleeding in ED while using restroom. Some mild lower abd pain that has resolved. Pt was seen in ed on 08/25/2021 with chest pain attributed to Htn and d/c with clonidine.  Patient denies any chest pain shortness of breath palpitations dizziness urinary issues skin rashes headaches or any visual issues.pt states this has never happened before and started after the prednisone. She denies using mobic or meloxicam, we will wait for med rec to confirm. Pt denies any complaints and ROS is negative.   Pt has past medical history of allergies to atorvastatin, Flexeril, hydrocodone, oxycodone, penicillin, paroxetine/Paxil, propoxyphene, trazodone, mirtazapine, CKD stage IV, CAD, polycythemia vera, diabetes mellitus type 2, hypertension. ED Course:   Vitals:   10/02/21 1730 10/02/21 1900 10/02/21 2000 10/02/21 2100  BP: (!) 191/87 (!) 184/78 (!) 190/89 (!) 186/78  Pulse: 70 (!) 57 64 (!) 58  Resp: 16 16 16 16   Temp:      TempSrc:      SpO2: 96% 94% 95% 97%  Weight:      Height:       In the emergency room patient is alert awake afebrile hypertensive, with O2 sats above 96% on room air. CMP today shows creatinine of 2.53 otherwise normal LFTs. CBC has a pending WBC count patient has a history of polycythemia vera hemoglobin today is 14.9 and platelets of 721 with an elevated RDW. Patient denies any dizziness or palpitations or shortness of breath or chest pain.  Review of Systems:  Review of Systems  Constitutional: Negative.   HENT: Negative.    Eyes: Negative.   Respiratory: Negative.    Cardiovascular: Negative.   Gastrointestinal:  Positive for blood in stool.  Negative for abdominal pain, constipation, diarrhea, heartburn, melena, nausea and vomiting.  Musculoskeletal: Negative.   Skin: Negative.   Neurological: Negative.   Endo/Heme/Allergies: Negative.     Past Medical History:  Diagnosis Date   Arthritis    osteoarthritis in hands, knees ( R torn cartilage, L meniscus removed)    CAD (coronary artery disease)    Diabetes mellitus without complication (HCC)    Glaucoma    Hyperlipidemia    Hypertension    Hypothyroidism     Past Surgical History:  Procedure Laterality Date   ABDOMINAL HYSTERECTOMY     CORONARY ANGIOPLASTY WITH STENT PLACEMENT     FOOT SURGERY Bilateral    KNEE ARTHROSCOPY W/ MENISCECTOMY Left    Also has R knee injury (torn cartilage)      reports that she quit smoking about 41 years ago. She has never used smokeless tobacco. She reports current alcohol use. She reports that she does not use drugs.  Allergies  Allergen Reactions   Atorvastatin Other (See Comments)    MYALGIA   Cyclobenzaprine Other (See Comments)   Hydrocodone-Acetaminophen     Other reaction(s): Hallucination when taking whole pill, pt tolerates taking 1/2 pill   Mirtazapine     Other reaction(s): Hallucination   Oxycodone-Acetaminophen Other (See Comments)   Paroxetine Hcl Other (See Comments)   Penicillins Swelling    Lip and orbital swelling   Propoxyphene Other (See Comments)  Trazodone Other (See Comments)    Family History  Problem Relation Age of Onset   Hypertension Mother    Hypertension Father    Early death Father    Heart disease Father     Prior to Admission medications   Medication Sig Start Date End Date Taking? Authorizing Provider  albuterol (VENTOLIN HFA) 108 (90 Base) MCG/ACT inhaler Inhale into the lungs. Patient not taking: No sig reported 03/20/17   [provider]  aspirin EC 81 MG tablet Take 1 tablet by mouth daily.    [provider]  azelastine (ASTELIN) 0.1 % nasal spray Place 1  spray into both nostrils 2 (two) times daily.    [provider]  benazepril (LOTENSIN) 40 MG tablet Take 1 tablet (40 mg total) by mouth daily. Patient not taking: Reported on 05/15/2021 01/21/15   Crecencio Mc, MD  cetirizine (ZYRTEC) 10 MG tablet Take 1 tablet by mouth daily.    [provider]  Cholecalciferol 25 MCG (1000 UT) tablet Take 1,000 Units by mouth daily. 04/26/17   [provider]  cloNIDine (CATAPRES) 0.1 MG tablet Take 1 tablet (0.1 mg total) by mouth daily as needed (Systolic blood pressure 660 or higher). 08/25/21 09/24/21  Lucrezia Starch, MD  DROXIA 300 MG capsule TAKE 1 CAPSULE (300 MG TOTAL) BY MOUTH DAILY. MAY TAKE WITH FOOD TO MINIMIZE GI SIDE EFFECTS. 04/23/20   Verlon Au, NP  gabapentin (NEURONTIN) 300 MG capsule Take 1 capsule by mouth 3 (three) times daily.  01/05/15 07/21/21  [provider]  glimepiride (AMARYL) 2 MG tablet Take 0.5 tablets by mouth daily. Take 0.5 tablet daily with largest meal Patient not taking: Reported on 05/15/2021 12/08/14   [provider]  glucose blood test strip Use once daily. Use as instructed. 02/21/17   [provider]  HYDROcodone-acetaminophen (NORCO/VICODIN) 5-325 MG tablet 1/2 po bid prn Earliest Fill Date: 07/19/17 07/19/17   [provider]  latanoprost (XALATAN) 0.005 % ophthalmic solution Place 1 drop into both eyes at bedtime. 09/13/15   [provider]  levothyroxine (SYNTHROID) 88 MCG tablet Take 88 mcg by mouth daily. Take on an empty stomach 30 to 60 minutes before breakfast 12/08/14   [provider]  LOTREL 5-20 MG capsule Take 1 capsule by mouth daily. 07/03/17   [provider]  meloxicam (MOBIC) 15 MG tablet TAKE 1 TABLET (15 MG TOTAL) BY MOUTH DAILY WITH BREAKFAST. Patient not taking: Reported on 05/15/2021 06/05/17   [provider]  metoprolol succinate (TOPROL-XL) 50 MG 24 hr tablet Take 1 tablet by mouth daily. 11/02/14    [provider]  rOPINIRole (REQUIP) 0.25 MG tablet Take 0.25 mg by mouth at bedtime. 04/22/21   [provider]  rosuvastatin (CRESTOR) 20 MG tablet Take 20 mg by mouth daily. 01/07/15   [provider]  sertraline (ZOLOFT) 50 MG tablet Take 50 mg by mouth daily. Patient not taking: Reported on 05/15/2021 05/03/21   [provider]  timolol (TIMOPTIC-XR) 0.5 % ophthalmic gel-forming Place 1 drop into both eyes every morning. 05/06/21   [provider]    Physical Exam: Vitals:   10/02/21 1730 10/02/21 1900 10/02/21 2000 10/02/21 2100  BP: (!) 191/87 (!) 184/78 (!) 190/89 (!) 186/78  Pulse: 70 (!) 57 64 (!) 58  Resp: 16 16 16 16   Temp:      TempSrc:      SpO2: 96% 94% 95% 97%  Weight:  Height:       Physical Exam Vitals and nursing note reviewed.  Constitutional:      General: She is not in acute distress.    Appearance: She is not ill-appearing, toxic-appearing or diaphoretic.  HENT:     Head: Normocephalic and atraumatic.     Right Ear: External ear normal.     Left Ear: External ear normal.     Nose: Nose normal.     Mouth/Throat:     Mouth: Mucous membranes are moist.  Eyes:     Extraocular Movements: Extraocular movements intact.     Pupils: Pupils are equal, round, and reactive to light.  Cardiovascular:     Rate and Rhythm: Normal rate and regular rhythm.     Pulses: Normal pulses.          Dorsalis pedis pulses are 2+ on the right side and 2+ on the left side.       Posterior tibial pulses are 2+ on the right side and 2+ on the left side.     Heart sounds: Normal heart sounds.  Pulmonary:     Effort: Pulmonary effort is normal.     Breath sounds: Normal breath sounds.  Abdominal:     General: Bowel sounds are normal. There is no distension.     Palpations: Abdomen is soft. There is no mass.     Tenderness: There is no abdominal tenderness. There is no guarding.     Hernia: No hernia is present.  Skin:    General:  Skin is warm and dry.  Neurological:     General: No focal deficit present.     Mental Status: She is alert and oriented to person, place, and time.     Cranial Nerves: Cranial nerves 2-12 are intact.  Psychiatric:        Attention and Perception: Attention normal.        Mood and Affect: Mood normal.        Speech: Speech normal.        Behavior: Behavior normal. Behavior is cooperative.        Thought Content: Thought content normal.     Labs on Admission: I have personally reviewed following labs and imaging studies  No results for input(s): CKTOTAL, CKMB, TROPONINI in the last 72 hours. Lab Results  Component Value Date   WBC 19.7 (H) 10/02/2021   HGB 14.9 10/02/2021   HCT 48.9 (H) 10/02/2021   MCV 92.3 10/02/2021   PLT 721 (H) 10/02/2021    Recent Labs  Lab 10/02/21 1215  NA 137  K 4.2  CL 108  CO2 23  BUN 43*  CREATININE 2.53*  CALCIUM 8.3*  PROT 6.6  BILITOT 0.3  ALKPHOS 52  ALT 15  AST 20  GLUCOSE 92   Lab Results  Component Value Date   CHOL 172 05/15/2021   HDL 45 05/15/2021   LDLCALC 102 (H) 05/15/2021   TRIG 127 05/15/2021   Lab Results  Component Value Date   DDIMER >20.00 (H) 05/15/2021   Invalid input(s): POCBNP   COVID-19 Labs No results for input(s): DDIMER, FERRITIN, LDH, CRP in the last 72 hours. Lab Results  Component Value Date   SARSCOV2NAA NEGATIVE 08/25/2021   [place] NEGATIVE 05/14/2021    Radiological Exams on Admission: CT ABDOMEN PELVIS WO CONTRAST  Result Date: 10/02/2021 CLINICAL DATA:  Lower GI bleed. EXAM: CT ABDOMEN AND PELVIS WITHOUT CONTRAST TECHNIQUE: Multidetector CT imaging of the abdomen and pelvis was performed  following the standard protocol without IV contrast. COMPARISON:  None. FINDINGS: Lower chest: No acute airspace disease or pleural effusion. Normal heart size with coronary artery calcifications. There is retained debris/ingested material within the distal esophagus, above the gastroesophageal  junction, series 2, image 6. 5 mm nodule in the right lower lobe, series 3, image 10. This nodule is unchanged in size from 09/14/2015 CT and considered benign. No further imaging follow-up or characterization is recommended. Hepatobiliary: No focal hepatic abnormality on this unenhanced exam. Gallstones within partially distended gallbladder. No pericholecystic fat stranding or inflammation. Calcifications at the porta hepatis are vascular rather than local physis. The common bile duct is nondilated. Pancreas: No ductal dilatation or inflammation. Spleen: Normal in size without focal abnormality. Adrenals/Urinary Tract: Normal adrenal glands. No hydronephrosis. No renal calculi. 2.1 cm water density lesion in the lateral left kidney is consistent with cyst. The urinary bladder is partially distended. Stomach/Bowel: Retained debris/ingested material within the distal esophagus, above the gastroesophageal junction. Stomach is physiologically distended without gastric wall thickening. No small bowel obstruction or inflammatory change. The appendix is not confidently visualized on the current exam. No appendicitis. Colonic diverticulosis involving the cecum and ascending colon as well as descending and sigmoid colon. No diverticulitis. No evidence of colonic wall thickening or colonic inflammation. No pericolonic edema. Enteric sutures at the rectosigmoid junction. Vascular/Lymphatic: Advanced aortic atherosclerosis. No aortic aneurysm. Moderate branch atherosclerosis. No portal venous or mesenteric gas. Scattered calcified lymph nodes in the upper abdomen. No enlarged lymph nodes in the abdomen or pelvis by size criteria. Reproductive: Status post hysterectomy. No adnexal masses. Other: No free air, free fluid, or intra-abdominal fluid collection. Postsurgical change of the anterior abdominal wall. Musculoskeletal: Diffuse degenerative disc disease in the lumbar spine. Bony under mineralization. No acute osseous  findings. IMPRESSION: 1. No explanation for GI bleed. Colonic diverticulosis without acute diverticulitis or evidence of colonic inflammation. 2. Retained debris/ingested material within the distal esophagus, above the gastroesophageal junction, may be reflux or delayed transit. 3. Cholelithiasis without CT findings of acute cholecystitis. Aortic Atherosclerosis (ICD10-I70.0). Electronically Signed   By: Keith Rake M.D.   On: 10/02/2021 18:17    EKG: Independently reviewed.  None     Assessment/Plan: Principal Problem:   Bright red rectal bleeding Active Problems:   Essential hypertension   Restless legs syndrome   Acquired hypothyroidism   CKD (chronic kidney disease) stage 4, GFR 15-29 ml/min (HCC)   CAD (coronary artery disease), native coronary artery   Polycythemia vera (HCC)   GIB (gastrointestinal bleeding)   Bright red rectal bleeding/lower GI bleed: Will admit to progressive unit with telemetry monitoring. Will obtain H&H every 6x3.  GI consult as deemed appropriate per a.m. team. Currently patient's hemoglobin is stable. Will start patient on IV PPI therapy and sips of clear liquid and medication as tolerated. Type and screen, transfuse if hemoglobin drops more than 2 units or the patient is unstable. CT angio cannot be done due to chronic kidney disease and contrast issues. Continue patient's meloxicam and baby aspirin.  Hypertension: Blood pressure (!) 186/78, pulse (!) 58, temperature 97.8 F (36.6 C), temperature source Oral, resp. rate 16, height 5' (1.524 m), weight 65.8 kg, SpO2 97 %. Hold patient's Lotensin and clonidine and in place start hydralazine Will hold clonidine p.o. to avoid rebound hypertension, will start clonidine point 1 mg patch.  Restless leg syndrome: Home medications held.  Ropinirole to be restarted as deemed appropriate.   Acquired hypothyroidism: Continue patient regimen of levothyroxine.  Chronic kidney disease: Lab Results   Component Value Date   CREATININE 2.53 (H) 10/02/2021   CREATININE 2.13 (H) 08/25/2021   CREATININE 1.88 (H) 05/15/2021  Avoid contrast renally dose medications.  CAD: Stable no chest pain.  Aspirin 81-hold Benazepril 40-hold Cetirizine, vitamin D-held Clonidine 0.1 mg patch continued, Droxia 300 mg capsule-held Gabapentin 300 mg-hold Amaryl 2 mg-hold Hydrocodone-hold Levothyroxine 88-held Lotrel-hold Metoprolol 50 mg-hold  ropinirole- held Rosuvastatin 20-hold Zoloft 50-hold. Patient's eyedrops atenolol, latanoprost-continued.  Polycythemia vera: -Aspirin 81 held due to rectal bleeding. - WBC count pending, thrombocytosis and erythrocytosis noted in chart.   DVT prophylaxis:  Scd's    Code Status:  Full code    Family Communication:  Barhorst,William (Spouse)  [phone] (Mobile)   Disposition Plan:  TBD   Consults called:  None   Admission status: Inpatient.     Medical Decision Making Problems Addressed: Acute lower GI bleeding: acute illness or injury    Details: acute lower gi bleed, rectal bleeding 200-300 cc blood in commode.  Amount and/or Complexity of Data Reviewed External Data Reviewed: labs, radiology and notes. Labs: ordered. ECG/medicine tests: ordered.  Risk Prescription drug management. Decision regarding hospitalization.    MDM Reviewed: previous chart, nursing note and vitals Reviewed previous: labs and CT scan    Para Skeans MD [facility]  6 PM- 2 AM. Please contact me via secure Chat 6 PM-2 AM. To contact the [facility] Attending or Consulting provider [place] or covering provider during after hours [place], for this patient.   Check the care team in [facility] and look for a) attending/consulting [facility] provider listed and b) the [facility] team listed Log into [email or web address] and use [place]'s universal password to access. If you do not have the password, please contact the hospital operator. Locate the [facility] provider you are  looking for under [facility] and page to a number that you can be directly reached. If you still have difficulty reaching the provider, please page the [facility] (Director on Call) for the Hospitalists listed on amion for assistance. [email or web address] 10/02/2021, 10:14 PM

## 2021-10-02 NOTE — ED Provider Notes (Signed)
[facility] Provider Note    Event Date/Time   First MD Initiated Contact with Patient 10/02/21 1703     (approximate)   History   Rectal Bleeding   HPI  Cassandra [name] is a 88 y.o. female with a history of hypertension and previous colon perforation who comes the ED complaining of bright red blood per rectum that started today.  She reports having constipation over the last few days, straining to have bowel movements.  This morning, she went to the bathroom thinking she was going to have a bowel movement but instead passed large amount of gross blood mixed with a small amount of stool.  She was having some lower abdominal pain with that as well which was nonradiating.  Denies dizziness chest pain shortness of breath or fever.  Does not take blood thinners.     Physical Exam   Triage Vital Signs: ED Triage Vitals  Enc Vitals Group     BP 10/02/21 1214 (!) 175/97     Pulse Rate 10/02/21 1214 70     Resp 10/02/21 1214 20     Temp 10/02/21 1214 97.8 F (36.6 C)     Temp Source 10/02/21 1214 Oral     SpO2 10/02/21 1214 91 %     Weight 10/02/21 1214 145 lb (65.8 kg)     Height 10/02/21 1214 5' (1.524 m)     Head Circumference --      Peak Flow --      Pain Score 10/02/21 1219 0     Pain Loc --      Pain Edu? --      Excl. in [place]? --     Most recent vital signs: Vitals:   10/02/21 2000 10/02/21 2100  BP: (!) 190/89 (!) 186/78  Pulse: 64 (!) 58  Resp: 16 16  Temp:    SpO2: 95% 97%     General: Awake, no distress.  CV:  Good peripheral perfusion.  Regular rate and rhythm Resp:  Normal effort.  Clear to auscultation bilaterally Abd:  No distention.  Nontender, no rigidity or guarding.  Rectal exam negative for enlarged or bleeding hemorrhoids. Other:  Passed  about 200 mL of gross blood in the toilet on arrival to the ED treatment room   ED Results / Procedures / Treatments   Labs (all labs ordered are listed, but only abnormal results  are displayed) Labs Reviewed  COMPREHENSIVE METABOLIC PANEL - Abnormal; Notable for the following components:      Result Value   BUN 43 (*)    Creatinine, Ser 2.53 (*)    Calcium 8.3 (*)    Albumin 2.8 (*)    GFR, Estimated 17 (*)    All other components within normal limits  CBC - Abnormal; Notable for the following components:   WBC 21.5 (*)    RBC 5.78 (*)    Hemoglobin 16.1 (*)    HCT 52.3 (*)    RDW 19.3 (*)    Platelets 797 (*)    All other components within normal limits  URINALYSIS, ROUTINE W REFLEX MICROSCOPIC - Abnormal; Notable for the following components:   Color, Urine RED (*)    APPearance CLOUDY (*)    Glucose, UA   (*)    Value: TEST NOT REPORTED DUE TO COLOR INTERFERENCE OF URINE PIGMENT   Hgb urine dipstick   (*)    Value: TEST NOT REPORTED DUE TO COLOR INTERFERENCE OF URINE PIGMENT  Bilirubin Urine   (*)    Value: TEST NOT REPORTED DUE TO COLOR INTERFERENCE OF URINE PIGMENT   Ketones, ur   (*)    Value: TEST NOT REPORTED DUE TO COLOR INTERFERENCE OF URINE PIGMENT   Protein, ur   (*)    Value: TEST NOT REPORTED DUE TO COLOR INTERFERENCE OF URINE PIGMENT   Nitrite   (*)    Value: TEST NOT REPORTED DUE TO COLOR INTERFERENCE OF URINE PIGMENT   Leukocytes,Ua   (*)    Value: TEST NOT REPORTED DUE TO COLOR INTERFERENCE OF URINE PIGMENT   All other components within normal limits  URINALYSIS, COMPLETE (UACMP) WITH MICROSCOPIC - Abnormal; Notable for the following components:   Color, Urine RED (*)    APPearance CLOUDY (*)    Glucose, UA   (*)    Value: TEST NOT REPORTED DUE TO COLOR INTERFERENCE OF URINE PIGMENT   Hgb urine dipstick   (*)    Value: TEST NOT REPORTED DUE TO COLOR INTERFERENCE OF URINE PIGMENT   Bilirubin Urine   (*)    Value: TEST NOT REPORTED DUE TO COLOR INTERFERENCE OF URINE PIGMENT   Ketones, ur   (*)    Value: TEST NOT REPORTED DUE TO COLOR INTERFERENCE OF URINE PIGMENT   Protein, ur   (*)    Value: TEST NOT REPORTED DUE TO COLOR  INTERFERENCE OF URINE PIGMENT   Nitrite   (*)    Value: TEST NOT REPORTED DUE TO COLOR INTERFERENCE OF URINE PIGMENT   Leukocytes,Ua   (*)    Value: TEST NOT REPORTED DUE TO COLOR INTERFERENCE OF URINE PIGMENT   Bacteria, UA RARE (*)    All other components within normal limits  CBC - Abnormal; Notable for the following components:   WBC 19.7 (*)    RBC 5.30 (*)    HCT 48.9 (*)    RDW 19.0 (*)    Platelets 721 (*)    All other components within normal limits  POC OCCULT BLOOD, ED  TYPE AND SCREEN     EKG     RADIOLOGY CT abdomen pelvis viewed and interpreted by me, negative for free air.  Radiology report reviewed which shows no acute intra-abdominal findings.    PROCEDURES:  Critical Care performed: No  .1-3 Lead EKG Interpretation Performed by: Carrie Mew, MD Authorized by: Carrie Mew, MD     Interpretation: normal     ECG rate:  80   ECG rate assessment: normal     Rhythm: sinus rhythm     Ectopy: none     Conduction: normal     MEDICATIONS ORDERED IN ED: Medications - No data to display   IMPRESSION / MDM / [place] / ED COURSE  I reviewed the triage vital signs and the nursing notes.                              Differential diagnosis includes, but is not limited to, bowel perforation, diverticular bleed, AVM, colon mass   The patient is on the cardiac monitor to evaluate for evidence of arrhythmia and/or significant heart rate changes.  Patient presents with gross blood per rectum concerning for lower GI bleed.  Vital signs overall unremarkable except for elevated blood pressure.  Labs showed leukocytosis of 21,000.  Initial hemoglobin of 16.  This trended downward slightly to a hemoglobin of 15 on repeat CBC.  Urinalysis is confounded  by large amount of blood in the sample.  Chemistry panel is unremarkable with creatinine consistent with baseline CKD, unremarkable electrolytes.  CT scan does not show any acute findings,  limited by absence of IV contrast.  During period of observation in the ED, she is not having any further bloody output, but with her large amount of blood passed so far, advanced age, I think it is prudent to observe overnight for hemodynamic and H&H monitoring.  Patient agrees.  Case discussed with hospitalist.      FINAL CLINICAL IMPRESSION(S) / ED DIAGNOSES   Final diagnoses:  Acute lower GI bleeding     Rx / DC Orders   ED Discharge Orders     None        Note:  This document was prepared using Dragon voice recognition software and may include unintentional dictation errors.   Carrie Mew, MD 10/02/21 2242

## 2021-10-02 NOTE — ED Triage Notes (Signed)
Pt to ED via [facility] from home for rectal bleeding. Pt states that symptoms stated this morning. Pt states that she felt like she was a loose BM, pt went and sat on the toilet and saw that there was blood. Pt states that the blood is mixed with some stool. Pt denies hx/o same. Pt states that she was having some pain in her lower stomach. Pt reports recent prednisone use.

## 2021-10-03 DIAGNOSIS — K625 Hemorrhage of anus and rectum: Secondary | ICD-10-CM | POA: Diagnosis present

## 2021-10-03 LAB — CBC
HCT: 51.7 % — ABNORMAL HIGH (ref 36.0–46.0)
HCT: 50.1 % — ABNORMAL HIGH (ref 36.0–46.0)
Hemoglobin: 16.1 g/dL — ABNORMAL HIGH (ref 12.0–15.0)
Hemoglobin: 15.5 g/dL — ABNORMAL HIGH (ref 12.0–15.0)
MCH: 28.1 pg (ref 26.0–34.0)
MCH: 28.3 pg (ref 26.0–34.0)
MCHC: 31.1 g/dL (ref 30.0–36.0)
MCHC: 30.9 g/dL (ref 30.0–36.0)
MCV: 90.4 fL (ref 80.0–100.0)
MCV: 91.4 fL (ref 80.0–100.0)
Platelets: 782 10*3/uL — ABNORMAL HIGH (ref 150–400)
Platelets: 750 10*3/uL — ABNORMAL HIGH (ref 150–400)
RBC: 5.72 MIL/uL — ABNORMAL HIGH (ref 3.87–5.11)
RBC: 5.48 MIL/uL — ABNORMAL HIGH (ref 3.87–5.11)
RDW: 19.5 % — ABNORMAL HIGH (ref 11.5–15.5)
RDW: 19.4 % — ABNORMAL HIGH (ref 11.5–15.5)
WBC: 19.9 10*3/uL — ABNORMAL HIGH (ref 4.0–10.5)
WBC: 19.2 10*3/uL — ABNORMAL HIGH (ref 4.0–10.5)
nRBC: 0 % (ref 0.0–0.2)
nRBC: 0 % (ref 0.0–0.2)

## 2021-10-03 LAB — BASIC METABOLIC PANEL
Anion gap: 7 (ref 5–15)
BUN: 41 mg/dL — ABNORMAL HIGH (ref 8–23)
CO2: 21 mmol/L — ABNORMAL LOW (ref 22–32)
Calcium: 8.1 mg/dL — ABNORMAL LOW (ref 8.9–10.3)
Chloride: 108 mmol/L (ref 98–111)
Creatinine, Ser: 2.39 mg/dL — ABNORMAL HIGH (ref 0.44–1.00)
GFR, Estimated: 19 mL/min — ABNORMAL LOW (ref >60)
Glucose, Bld: 98 mg/dL (ref 70–99)
Potassium: 4.1 mmol/L (ref 3.5–5.1)
Sodium: 136 mmol/L (ref 135–145)

## 2021-10-03 LAB — RESP PANEL BY RT-PCR (FLU A&B, COVID) ARPGX2
Influenza A by PCR: NEGATIVE
Influenza B by PCR: NEGATIVE
SARS Coronavirus 2 by RT PCR: NEGATIVE

## 2021-10-03 MED ORDER — POLYETHYLENE GLYCOL 3350 17 G PO PACK
17.0000 g | PACK | Freq: Every day | ORAL | Status: DC
  Administered 2021-10-03: 17 g via ORAL
  Filled 2021-10-03: qty 1

## 2021-10-03 MED ORDER — METOPROLOL SUCCINATE ER 50 MG PO TB24
50.0000 mg | ORAL_TABLET | Freq: Every day | ORAL | Status: DC
  Administered 2021-10-03 – 2021-10-05 (×3): 50 mg via ORAL
  Filled 2021-10-03 (×3): qty 1

## 2021-10-03 MED ORDER — AMLODIPINE BESYLATE 5 MG PO TABS
5.0000 mg | ORAL_TABLET | Freq: Every day | ORAL | Status: DC
  Administered 2021-10-03 – 2021-10-05 (×3): 5 mg via ORAL
  Filled 2021-10-03 (×3): qty 1

## 2021-10-03 MED ORDER — BENAZEPRIL HCL 20 MG PO TABS
20.0000 mg | ORAL_TABLET | Freq: Every day | ORAL | Status: DC
  Administered 2021-10-04 – 2021-10-05 (×2): 20 mg via ORAL
  Filled 2021-10-03 (×3): qty 1

## 2021-10-03 MED ORDER — ROPINIROLE HCL 0.25 MG PO TABS
0.2500 mg | ORAL_TABLET | Freq: Every day | ORAL | Status: DC
  Administered 2021-10-03 – 2021-10-04 (×2): 0.25 mg via ORAL
  Filled 2021-10-03 (×3): qty 1

## 2021-10-03 MED ORDER — GABAPENTIN 300 MG PO CAPS
300.0000 mg | ORAL_CAPSULE | Freq: Two times a day (BID) | ORAL | Status: DC
  Administered 2021-10-03 – 2021-10-05 (×5): 300 mg via ORAL
  Filled 2021-10-03 (×5): qty 1

## 2021-10-03 NOTE — ED Notes (Signed)
Pt called on call bell about 5 minutes after nurse left \\room . Monitor was beeping. Turned off monitor and repositioned in bed per request. Pt does not want breakfast.

## 2021-10-03 NOTE — ED Notes (Signed)
Transport called.

## 2021-10-03 NOTE — ED Notes (Signed)
Moved pt to hospital bed and provided more pillows. Placed pillows under knees. Provided broth chicken, beef per request.

## 2021-10-03 NOTE — ED Notes (Signed)
Wrote to attending to clarify whether to remove clonidine patch or not.

## 2021-10-03 NOTE — ED Notes (Signed)
Lab at bedside. Pt is alert.

## 2021-10-03 NOTE — ED Notes (Signed)
Husband at bedside again.

## 2021-10-03 NOTE — ED Notes (Signed)
Lab at bedside

## 2021-10-03 NOTE — ED Notes (Signed)
See new orders. Attending provider informed of HR consistently above 115.

## 2021-10-03 NOTE — Progress Notes (Signed)
PROGRESS NOTE    Cassandra [name]  FYB:[id number] DOB: 07/09/1927 DOA: 10/02/2021 PCP: Rusty Aus, MD    Brief Narrative:  88 year old female with history of colon rupture during screening colonoscopy 10 years ago resulting in [place] procedure and subsequent closure, history of diverticulosis, CKD stage IV, coronary artery disease, polycythemia vera, type 2 diabetes not on treatment and essential hypertension presented to the ER with 2 episodes of blood mixed in stool with some abdominal cramping.  Patient is on aspirin at home.  In the emergency room blood pressure is elevated.  Otherwise hemodynamically stable.  Rectal exam as per ER records with fresh blood.  No bleeding since then.  Hemoglobin is stable.   Assessment & Plan:   Bright red blood per rectum/lower GI bleed/clinically outlet bleeding also suspect diverticular bleed: Patient currently clinically stabilizing.  No evidence of ongoing bleeding.  No BM since hospitalization. Known previous hemoglobin 16-17.4.  Hemoglobin is at about baseline.  She has polycythemia vera. CT scan on admission with no active findings, has evidence of diverticulosis. Patient does have history of left colon resection, colostomy and reversal but currently no evidence of surgical complication. Patient had severe critical illness resulted after colonoscopy, she is opposed to any idea of endoscopy evaluation unless fatal.  Since patient is not showing any evidence of ongoing bleeding, will advance diet, mobilize, start laxative and monitor.  Anticipate conservative management.  If any recurrent bleeding, will do RBC tagged scan.  Avoid contrast with CKD stage IV. Check hemoglobin twice a day.  Essential hypertension: Blood pressures elevated.  Resume home medication including beta-blocker and amlodipine benazepril. Patient is not on clonidine or hydralazine.  Discontinue.  Hypothyroidism: On Synthroid.  CKD stage IV: At about her  levels.  Coronary artery disease: On aspirin, will hold.  Resume beta-blockers.  Resume Crestor.  Type 2 diabetes: Diet controlled.  Not on treatment.  DVT prophylaxis: SCDs Start: 10/02/21 2205   Code Status: Full code Family Communication: Husband at the bedside Disposition Plan: Status is: Inpatient  Remains inpatient appropriate because: Ongoing GI bleeding.  Monitoring.         Consultants:  None  Procedures:  None  Antimicrobials:  None   Subjective: Patient seen and examined.  Denies any complaints.  Husband at the bedside.  She has no bowel movement or bleeding per rectum since admission.  Inquiring about going home.  Patient and husband tells me about critical near fatal experience after colonoscopy and she would avoid colonoscopy at any cost.  Denies any nausea vomiting, will advance diet.  Objective: Vitals:   10/03/21 1130 10/03/21 1200 10/03/21 1230 10/03/21 1300  BP: 122/86 134/81 (!) 139/96 (!) 148/104  Pulse: 81 (!) 106 94 75  Resp: (!) 23 20 18 16   Temp:      TempSrc:      SpO2: 96% 90% 90% 95%  Weight:      Height:       No intake or output data in the 24 hours ending 10/03/21 1319 Filed Weights   10/02/21 1214 10/02/21 1219  Weight: 65.8 kg 65.8 kg    Examination:  General exam: Appears calm and comfortable  Interactive.  Younger than her stated age. Respiratory system: Clear to auscultation. Respiratory effort normal. Cardiovascular system: S1 & S2 heard, RRR. No JVD, murmurs, rubs, gallops or clicks. No pedal edema. Gastrointestinal system: Abdomen is nondistended, soft and nontender. No organomegaly or masses felt. Normal bowel sounds heard. Central nervous system: Alert and oriented. No  focal neurological deficits. Extremities: Symmetric 5 x 5 power. Skin: No rashes, lesions or ulcers Psychiatry: Judgement and insight appear normal. Mood & affect appropriate.     Data Reviewed: I have personally reviewed following labs and  imaging studies  CBC: Recent Labs  Lab 10/02/21 1215 10/02/21 2040 10/03/21 0732  WBC 21.5* 19.7* 19.9*  HGB 16.1* 14.9 16.1*  HCT 52.3* 48.9* 51.7*  MCV 90.5 92.3 90.4  PLT 797* 721* 626*   Basic Metabolic Panel: Recent Labs  Lab 10/02/21 1215 10/03/21 0732  NA 137 136  K 4.2 4.1  CL 108 108  CO2 23 21*  GLUCOSE 92 98  BUN 43* 41*  CREATININE 2.53* 2.39*  CALCIUM 8.3* 8.1*   GFR: Estimated Creatinine Clearance: 12.7 mL/min (A) (by C-G formula based on SCr of 2.39 mg/dL (H)). Liver Function Tests: Recent Labs  Lab 10/02/21 1215  AST 20  ALT 15  ALKPHOS 52  BILITOT 0.3  PROT 6.6  ALBUMIN 2.8*   No results for input(s): LIPASE, AMYLASE in the last 168 hours. No results for input(s): AMMONIA in the last 168 hours. Coagulation Profile: No results for input(s): INR, PROTIME in the last 168 hours. Cardiac Enzymes: No results for input(s): CKTOTAL, CKMB, CKMBINDEX, TROPONINI in the last 168 hours. BNP (last 3 results) No results for input(s): PROBNP in the last 8760 hours. HbA1C: No results for input(s): HGBA1C in the last 72 hours. CBG: No results for input(s): GLUCAP in the last 168 hours. Lipid Profile: No results for input(s): CHOL, HDL, LDLCALC, TRIG, CHOLHDL, LDLDIRECT in the last 72 hours. Thyroid Function Tests: No results for input(s): TSH, T4TOTAL, FREET4, T3FREE, THYROIDAB in the last 72 hours. Anemia Panel: No results for input(s): VITAMINB12, FOLATE, FERRITIN, TIBC, IRON, RETICCTPCT in the last 72 hours. Sepsis Labs: No results for input(s): PROCALCITON, LATICACIDVEN in the last 168 hours.  Recent Results (from the past 240 hour(s))  Resp Panel by RT-PCR (Flu A&B, Covid) Nasopharyngeal Swab     Status: None   Collection Time: 10/03/21  4:20 AM   Specimen: Nasopharyngeal Swab; Nasopharyngeal(NP) swabs in vial transport medium  Result Value Ref Range Status   SARS Coronavirus 2 by RT PCR NEGATIVE NEGATIVE Final    Comment: (NOTE) SARS-CoV-2  target nucleic acids are NOT DETECTED.  The SARS-CoV-2 RNA is generally detectable in upper respiratory specimens during the acute phase of infection. The lowest concentration of SARS-CoV-2 viral copies this assay can detect is 138 copies/mL. A negative result does not preclude SARS-Cov-2 infection and should not be used as the sole basis for treatment or other patient management decisions. A negative result may occur with  improper specimen collection/handling, submission of specimen other than nasopharyngeal swab, presence of viral mutation(s) within the areas targeted by this assay, and inadequate number of viral copies(<138 copies/mL). A negative result must be combined with clinical observations, patient history, and epidemiological information. The expected result is Negative.  Fact Sheet for Patients:  EntrepreneurPulse.com.au  Fact Sheet for Healthcare Providers:  IncredibleEmployment.be  This test is no t yet approved or cleared by the [place] FDA and  has been authorized for detection and/or diagnosis of SARS-CoV-2 by FDA under an Emergency Use Authorization (EUA). This EUA will remain  in effect (meaning this test can be used) for the duration of the COVID-19 declaration under Section 564(b)(1) of the Act, 21 U.S.C.section 360bbb-3(b)(1), unless the authorization is terminated  or revoked sooner.       Influenza A by PCR NEGATIVE  NEGATIVE Final   Influenza B by PCR NEGATIVE NEGATIVE Final    Comment: (NOTE) The Xpert Xpress SARS-CoV-2/FLU/RSV plus assay is intended as an aid in the diagnosis of influenza from Nasopharyngeal swab specimens and should not be used as a sole basis for treatment. Nasal washings and aspirates are unacceptable for Xpert Xpress SARS-CoV-2/FLU/RSV testing.  Fact Sheet for Patients: EntrepreneurPulse.com.au  Fact Sheet for Healthcare  Providers: IncredibleEmployment.be  This test is not yet approved or cleared by the [place] FDA and has been authorized for detection and/or diagnosis of SARS-CoV-2 by FDA under an Emergency Use Authorization (EUA). This EUA will remain in effect (meaning this test can be used) for the duration of the COVID-19 declaration under Section 564(b)(1) of the Act, 21 U.S.C. section 360bbb-3(b)(1), unless the authorization is terminated or revoked.  Performed at [facility], [address] [place], [place] [address]          Radiology Studies: CT ABDOMEN PELVIS WO CONTRAST  Result Date: 10/02/2021 CLINICAL DATA:  Lower GI bleed. EXAM: CT ABDOMEN AND PELVIS WITHOUT CONTRAST TECHNIQUE: Multidetector CT imaging of the abdomen and pelvis was performed following the standard protocol without IV contrast. COMPARISON:  None. FINDINGS: Lower chest: No acute airspace disease or pleural effusion. Normal heart size with coronary artery calcifications. There is retained debris/ingested material within the distal esophagus, above the gastroesophageal junction, series 2, image 6. 5 mm nodule in the right lower lobe, series 3, image 10. This nodule is unchanged in size from 09/14/2015 CT and considered benign. No further imaging follow-up or characterization is recommended. Hepatobiliary: No focal hepatic abnormality on this unenhanced exam. Gallstones within partially distended gallbladder. No pericholecystic fat stranding or inflammation. Calcifications at the porta hepatis are vascular rather than local physis. The common bile duct is nondilated. Pancreas: No ductal dilatation or inflammation. Spleen: Normal in size without focal abnormality. Adrenals/Urinary Tract: Normal adrenal glands. No hydronephrosis. No renal calculi. 2.1 cm water density lesion in the lateral left kidney is consistent with cyst. The urinary bladder is partially distended. Stomach/Bowel: Retained  debris/ingested material within the distal esophagus, above the gastroesophageal junction. Stomach is physiologically distended without gastric wall thickening. No small bowel obstruction or inflammatory change. The appendix is not confidently visualized on the current exam. No appendicitis. Colonic diverticulosis involving the cecum and ascending colon as well as descending and sigmoid colon. No diverticulitis. No evidence of colonic wall thickening or colonic inflammation. No pericolonic edema. Enteric sutures at the rectosigmoid junction. Vascular/Lymphatic: Advanced aortic atherosclerosis. No aortic aneurysm. Moderate branch atherosclerosis. No portal venous or mesenteric gas. Scattered calcified lymph nodes in the upper abdomen. No enlarged lymph nodes in the abdomen or pelvis by size criteria. Reproductive: Status post hysterectomy. No adnexal masses. Other: No free air, free fluid, or intra-abdominal fluid collection. Postsurgical change of the anterior abdominal wall. Musculoskeletal: Diffuse degenerative disc disease in the lumbar spine. Bony under mineralization. No acute osseous findings. IMPRESSION: 1. No explanation for GI bleed. Colonic diverticulosis without acute diverticulitis or evidence of colonic inflammation. 2. Retained debris/ingested material within the distal esophagus, above the gastroesophageal junction, may be reflux or delayed transit. 3. Cholelithiasis without CT findings of acute cholecystitis. Aortic Atherosclerosis (ICD10-I70.0). Electronically Signed   By: Keith Rake M.D.   On: 10/02/2021 18:17        Scheduled Meds:  amLODipine  5 mg Oral Daily   And   benazepril  20 mg Oral Daily   [START ON 10/04/2021] azelastine  1 spray  Each Nare BID   gabapentin  300 mg Oral BID   latanoprost  1 drop Both Eyes QHS   levothyroxine  88 mcg Oral Daily   metoprolol succinate  50 mg Oral Daily   pantoprazole (PROTONIX) IV  40 mg Intravenous Q12H   polyethylene glycol  17 g Oral  Daily   [START ON 10/04/2021] rOPINIRole  0.25 mg Oral QHS   [START ON 10/04/2021] rosuvastatin  20 mg Oral QHS   timolol  1 drop Both Eyes q morning   Continuous Infusions:  lactated ringers 100 mL/hr at 10/03/21 0756     LOS: 1 day    Time spent: 40 minutes    Barb Merino, MD [facility] Pager [phone]

## 2021-10-04 LAB — CBC
HCT: 48.3 % — ABNORMAL HIGH (ref 36.0–46.0)
HCT: 45.8 % (ref 36.0–46.0)
Hemoglobin: 14.9 g/dL (ref 12.0–15.0)
Hemoglobin: 14.4 g/dL (ref 12.0–15.0)
MCH: 28.2 pg (ref 26.0–34.0)
MCH: 28.6 pg (ref 26.0–34.0)
MCHC: 30.8 g/dL (ref 30.0–36.0)
MCHC: 31.4 g/dL (ref 30.0–36.0)
MCV: 91.3 fL (ref 80.0–100.0)
MCV: 91.1 fL (ref 80.0–100.0)
Platelets: 685 10*3/uL — ABNORMAL HIGH (ref 150–400)
Platelets: 629 10*3/uL — ABNORMAL HIGH (ref 150–400)
RBC: 5.29 MIL/uL — ABNORMAL HIGH (ref 3.87–5.11)
RBC: 5.03 MIL/uL (ref 3.87–5.11)
RDW: 19.1 % — ABNORMAL HIGH (ref 11.5–15.5)
RDW: 18.8 % — ABNORMAL HIGH (ref 11.5–15.5)
WBC: 16.4 10*3/uL — ABNORMAL HIGH (ref 4.0–10.5)
WBC: 16.6 10*3/uL — ABNORMAL HIGH (ref 4.0–10.5)
nRBC: 0 % (ref 0.0–0.2)
nRBC: 0 % (ref 0.0–0.2)

## 2021-10-04 MED ORDER — HYDRALAZINE HCL 20 MG/ML IJ SOLN
2.0000 mg | Freq: Once | INTRAMUSCULAR | Status: AC
  Administered 2021-10-04: 2 mg via INTRAVENOUS
  Filled 2021-10-04: qty 1

## 2021-10-04 MED ORDER — POLYETHYLENE GLYCOL 3350 17 G PO PACK
17.0000 g | PACK | Freq: Every day | ORAL | Status: DC
  Administered 2021-10-04: 17 g via ORAL
  Filled 2021-10-04 (×2): qty 1

## 2021-10-04 NOTE — TOC Initial Note (Signed)
Transition of Care [facility]) - Initial/Assessment Note    Patient Details  Name: Cassandra [name] MRN: [id number] Date of Birth: [birth year]  Transition [facility]) CM/SW Contact:    Beverly Sessions, RN Phone Number: 10/04/2021, 2:03 PM  Clinical Narrative:                   Transition of Care [facility]) Screening Note   Patient Details  Name: Cassandra [name] Date of Birth: 09/25/1927   Transition of Care [facility]) CM/SW Contact:    Beverly Sessions, RN Phone Number: 10/04/2021, 2:03 PM    Transition of Care Department [facility]) has reviewed patient and no TOC needs have been identified at this time. We will continue to monitor patient advancement through interdisciplinary progression rounds. If new patient transition needs arise, please place a TOC consult.         Patient Goals and CMS Choice        Expected Discharge Plan and Services                                                Prior Living Arrangements/Services                       Activities of Daily Living Home Assistive Devices/Equipment: Gilford Rile (specify type) ADL Screening (condition at time of admission) Patient's cognitive ability adequate to safely complete daily activities?: Yes Is the patient deaf or have difficulty hearing?: No Does the patient have difficulty seeing, even when wearing glasses/contacts?: No Does the patient have difficulty concentrating, remembering, or making decisions?: No Patient able to express need for assistance with ADLs?: No Does the patient have difficulty dressing or bathing?: No Independently performs ADLs?: Yes (appropriate for developmental age) Does the patient have difficulty walking or climbing stairs?: No Weakness of Legs: None Weakness of Arms/Hands: None  Permission Sought/Granted                  Emotional Assessment              Admission diagnosis:  Rectal bleeding [K62.5] Acute lower GI bleeding [K92.2] GIB (gastrointestinal  bleeding) [K92.2] Patient Active Problem List   Diagnosis Date Noted   Rectal bleeding 10/03/2021   Bright red rectal bleeding 10/02/2021   GIB (gastrointestinal bleeding) 10/02/2021   Hypoxemia 05/15/2021   Hypoxia 05/14/2021   Hypertensive urgency 05/14/2021   Tibial plateau fracture 04/21/21, left, closed, with routine healing, subsequent encounter 05/14/2021   CKD (chronic kidney disease) stage 4, GFR 15-29 ml/min ([place]) 04/07/2021   Acquired hypothyroidism 12/02/2020   Aortic atherosclerosis ([place]) 10/04/2020   Polycythemia vera ([place]) 06/02/2020   Essential hypertension 01/24/2015   Type 2 DM with diabetic neuropathy affecting both sides of body ([place]) 01/24/2015   Restless legs syndrome 01/24/2015   Chronic constipation 01/24/2015   CAD (coronary artery disease), native coronary artery 01/07/2014   PCP:  Rusty Aus, MD Pharmacy:   [facility] - [place], [place] - [address] [place] [address] Phone: [phone] Fax: [phone]  [id number] - [place], [place] [address] [place] [address] Phone: [phone] Fax: [phone]  [facility] [place], [place] [address] [place] [address] Phone: [phone]  Fax: [phone]     Social Determinants of Health (SDOH) Interventions    Readmission Risk Interventions No flowsheet data found.

## 2021-10-04 NOTE — Progress Notes (Signed)
PROGRESS NOTE    Cassandra [name]  WUJ:[id number] DOB: 1927-07-22 DOA: 10/02/2021 PCP: Rusty Aus, MD    Brief Narrative:  88 year old female with history of colon rupture during screening colonoscopy 10 years ago resulting in [place] procedure and subsequent closure, history of diverticulosis, CKD stage IV, coronary artery disease, polycythemia vera, type 2 diabetes not on treatment and essential hypertension presented to the ER with 2 episodes of blood mixed in stool with some abdominal cramping.  Patient is on aspirin at home.  In the emergency room blood pressure is elevated.  Otherwise hemodynamically stable.  Rectal exam as per ER records with fresh blood.  Hemoglobin is stable.   Assessment & Plan:   Bright red blood per rectum/lower GI bleed/clinically outlet bleeding also suspect diverticular bleed: Patient currently clinically stabilizing.  No evidence of ongoing bleeding.   Patient only had 1 bowel movement with trace of blood. Known previous hemoglobin 16-17.4.  Hemoglobin is at about baseline.  She has polycythemia vera. CT scan on admission with no active findings, has evidence of diverticulosis. Patient does have history of left colon resection, colostomy and reversal but currently no evidence of surgical complication. Patient had severe critical illness resulted after colonoscopy, she is opposed to any idea of endoscopy evaluation unless fatal.  Since patient is not showing any evidence of ongoing bleeding, will advance diet, mobilize, start laxative and monitor.  Anticipate conservative management.  If any recurrent bleeding, will do RBC tagged scan.  Avoid contrast with CKD stage IV. Check hemoglobin twice a day. Advance to regular diet.  MiraLAX.  Ensure adequate bowel movement with no bleeding before discharge.  Essential hypertension: Blood pressures elevated.  Resume home medication including beta-blocker and amlodipine benazepril. Patient is not on clonidine or  hydralazine.   She only takes clonidine as needed at home.  Currently blood pressure is stable on above regimen.  Hypothyroidism: On Synthroid.  CKD stage IV: At about her levels.  Coronary artery disease: On aspirin, will hold.  Resume beta-blockers.  Resume Crestor.  Type 2 diabetes: Diet controlled.  Not on treatment at home.  DVT prophylaxis: SCDs Start: 10/02/21 2205   Code Status: Full code Family Communication: None. Disposition Plan: Status is: Inpatient  Remains inpatient appropriate because: Ongoing GI bleeding.  Monitoring.     Consultants:  None  Procedures:  None  Antimicrobials:  None   Subjective:  Patient seen and examined.  Denies any nausea vomiting or abdominal pain.  She had 1 small bloody bowel movement yesterday evening but none since then.  Tolerating liquids. Will advance to regular diet, add MiraLAX.  Hopefully she had self-limiting bleeding.  Anticipate discharge home tomorrow morning if no further bleeding.  Patient and husband tells me about critical near fatal experience after colonoscopy and she would avoid colonoscopy at any cost.  Denies any nausea vomiting, will advance diet.  Objective: Vitals:   10/03/21 1806 10/03/21 2040 10/04/21 0400 10/04/21 0803  BP: (!) 159/92 140/82 130/82 (!) 144/93  Pulse: 84 92 85 (!) 102  Resp: 18 20 20 18   Temp: 98.3 F (36.8 C) 98.3 F (36.8 C) 98.4 F (36.9 C) 98.3 F (36.8 C)  TempSrc:   Oral Oral  SpO2: 92% 95% 95% 90%  Weight:      Height:        Intake/Output Summary (Last 24 hours) at 10/04/2021 1053 Last data filed at 10/04/2021 1030 Gross per 24 hour  Intake 1240 ml  Output 1602 ml  Net -362  ml   Filed Weights   10/02/21 1214 10/02/21 1219  Weight: 65.8 kg 65.8 kg    Examination:  General exam: Appears calm and comfortable.  Interactive on room air. Respiratory system: Clear to auscultation. Respiratory effort normal. Cardiovascular system: S1 & S2 heard, RRR. No JVD,  murmurs, rubs, gallops or clicks. No pedal edema. Gastrointestinal system: Abdomen is nondistended, soft and nontender. No organomegaly or masses felt. Normal bowel sounds heard.  Surgical scars are nontender. Central nervous system: Alert and oriented. No focal neurological deficits. Extremities: Symmetric 5 x 5 power. Skin: No rashes, lesions or ulcers Psychiatry: Judgement and insight appear normal. Mood & affect appropriate.     Data Reviewed: I have personally reviewed following labs and imaging studies  CBC: Recent Labs  Lab 10/02/21 1215 10/02/21 2040 10/03/21 0732 10/03/21 1641 10/04/21 0454  WBC 21.5* 19.7* 19.9* 19.2* 16.4*  HGB 16.1* 14.9 16.1* 15.5* 14.9  HCT 52.3* 48.9* 51.7* 50.1* 48.3*  MCV 90.5 92.3 90.4 91.4 91.3  PLT 797* 721* 782* 750* 161*   Basic Metabolic Panel: Recent Labs  Lab 10/02/21 1215 10/03/21 0732  NA 137 136  K 4.2 4.1  CL 108 108  CO2 23 21*  GLUCOSE 92 98  BUN 43* 41*  CREATININE 2.53* 2.39*  CALCIUM 8.3* 8.1*   GFR: Estimated Creatinine Clearance: 12.7 mL/min (A) (by C-G formula based on SCr of 2.39 mg/dL (H)). Liver Function Tests: Recent Labs  Lab 10/02/21 1215  AST 20  ALT 15  ALKPHOS 52  BILITOT 0.3  PROT 6.6  ALBUMIN 2.8*   No results for input(s): LIPASE, AMYLASE in the last 168 hours. No results for input(s): AMMONIA in the last 168 hours. Coagulation Profile: No results for input(s): INR, PROTIME in the last 168 hours. Cardiac Enzymes: No results for input(s): CKTOTAL, CKMB, CKMBINDEX, TROPONINI in the last 168 hours. BNP (last 3 results) No results for input(s): PROBNP in the last 8760 hours. HbA1C: No results for input(s): HGBA1C in the last 72 hours. CBG: No results for input(s): GLUCAP in the last 168 hours. Lipid Profile: No results for input(s): CHOL, HDL, LDLCALC, TRIG, CHOLHDL, LDLDIRECT in the last 72 hours. Thyroid Function Tests: No results for input(s): TSH, T4TOTAL, FREET4, T3FREE, THYROIDAB in  the last 72 hours. Anemia Panel: No results for input(s): VITAMINB12, FOLATE, FERRITIN, TIBC, IRON, RETICCTPCT in the last 72 hours. Sepsis Labs: No results for input(s): PROCALCITON, LATICACIDVEN in the last 168 hours.  Recent Results (from the past 240 hour(s))  Resp Panel by RT-PCR (Flu A&B, Covid) Nasopharyngeal Swab     Status: None   Collection Time: 10/03/21  4:20 AM   Specimen: Nasopharyngeal Swab; Nasopharyngeal(NP) swabs in vial transport medium  Result Value Ref Range Status   SARS Coronavirus 2 by RT PCR NEGATIVE NEGATIVE Final    Comment: (NOTE) SARS-CoV-2 target nucleic acids are NOT DETECTED.  The SARS-CoV-2 RNA is generally detectable in upper respiratory specimens during the acute phase of infection. The lowest concentration of SARS-CoV-2 viral copies this assay can detect is 138 copies/mL. A negative result does not preclude SARS-Cov-2 infection and should not be used as the sole basis for treatment or other patient management decisions. A negative result may occur with  improper specimen collection/handling, submission of specimen other than nasopharyngeal swab, presence of viral mutation(s) within the areas targeted by this assay, and inadequate number of viral copies(<138 copies/mL). A negative result must be combined with clinical observations, patient history, and epidemiological information. The expected  result is Negative.  Fact Sheet for Patients:  EntrepreneurPulse.com.au  Fact Sheet for Healthcare Providers:  IncredibleEmployment.be  This test is no t yet approved or cleared by the [place] FDA and  has been authorized for detection and/or diagnosis of SARS-CoV-2 by FDA under an Emergency Use Authorization (EUA). This EUA will remain  in effect (meaning this test can be used) for the duration of the COVID-19 declaration under Section 564(b)(1) of the Act, 21 U.S.C.section 360bbb-3(b)(1), unless the  authorization is terminated  or revoked sooner.       Influenza A by PCR NEGATIVE NEGATIVE Final   Influenza B by PCR NEGATIVE NEGATIVE Final    Comment: (NOTE) The Xpert Xpress SARS-CoV-2/FLU/RSV plus assay is intended as an aid in the diagnosis of influenza from Nasopharyngeal swab specimens and should not be used as a sole basis for treatment. Nasal washings and aspirates are unacceptable for Xpert Xpress SARS-CoV-2/FLU/RSV testing.  Fact Sheet for Patients: EntrepreneurPulse.com.au  Fact Sheet for Healthcare Providers: IncredibleEmployment.be  This test is not yet approved or cleared by the [place] FDA and has been authorized for detection and/or diagnosis of SARS-CoV-2 by FDA under an Emergency Use Authorization (EUA). This EUA will remain in effect (meaning this test can be used) for the duration of the COVID-19 declaration under Section 564(b)(1) of the Act, 21 U.S.C. section 360bbb-3(b)(1), unless the authorization is terminated or revoked.  Performed at [facility], [address], [place], [place] [address]          Radiology Studies: CT ABDOMEN PELVIS WO CONTRAST  Result Date: 10/02/2021 CLINICAL DATA:  Lower GI bleed. EXAM: CT ABDOMEN AND PELVIS WITHOUT CONTRAST TECHNIQUE: Multidetector CT imaging of the abdomen and pelvis was performed following the standard protocol without IV contrast. COMPARISON:  None. FINDINGS: Lower chest: No acute airspace disease or pleural effusion. Normal heart size with coronary artery calcifications. There is retained debris/ingested material within the distal esophagus, above the gastroesophageal junction, series 2, image 6. 5 mm nodule in the right lower lobe, series 3, image 10. This nodule is unchanged in size from 09/14/2015 CT and considered benign. No further imaging follow-up or characterization is recommended. Hepatobiliary: No focal hepatic abnormality on this unenhanced  exam. Gallstones within partially distended gallbladder. No pericholecystic fat stranding or inflammation. Calcifications at the porta hepatis are vascular rather than local physis. The common bile duct is nondilated. Pancreas: No ductal dilatation or inflammation. Spleen: Normal in size without focal abnormality. Adrenals/Urinary Tract: Normal adrenal glands. No hydronephrosis. No renal calculi. 2.1 cm water density lesion in the lateral left kidney is consistent with cyst. The urinary bladder is partially distended. Stomach/Bowel: Retained debris/ingested material within the distal esophagus, above the gastroesophageal junction. Stomach is physiologically distended without gastric wall thickening. No small bowel obstruction or inflammatory change. The appendix is not confidently visualized on the current exam. No appendicitis. Colonic diverticulosis involving the cecum and ascending colon as well as descending and sigmoid colon. No diverticulitis. No evidence of colonic wall thickening or colonic inflammation. No pericolonic edema. Enteric sutures at the rectosigmoid junction. Vascular/Lymphatic: Advanced aortic atherosclerosis. No aortic aneurysm. Moderate branch atherosclerosis. No portal venous or mesenteric gas. Scattered calcified lymph nodes in the upper abdomen. No enlarged lymph nodes in the abdomen or pelvis by size criteria. Reproductive: Status post hysterectomy. No adnexal masses. Other: No free air, free fluid, or intra-abdominal fluid collection. Postsurgical change of the anterior abdominal wall. Musculoskeletal: Diffuse degenerative disc disease in the lumbar spine. Bony under  mineralization. No acute osseous findings. IMPRESSION: 1. No explanation for GI bleed. Colonic diverticulosis without acute diverticulitis or evidence of colonic inflammation. 2. Retained debris/ingested material within the distal esophagus, above the gastroesophageal junction, may be reflux or delayed transit. 3.  Cholelithiasis without CT findings of acute cholecystitis. Aortic Atherosclerosis (ICD10-I70.0). Electronically Signed   By: Keith Rake M.D.   On: 10/02/2021 18:17        Scheduled Meds:  amLODipine  5 mg Oral Daily   And   benazepril  20 mg Oral Daily   azelastine  1 spray Each Nare BID   gabapentin  300 mg Oral BID   latanoprost  1 drop Both Eyes QHS   levothyroxine  88 mcg Oral Daily   metoprolol succinate  50 mg Oral Daily   pantoprazole (PROTONIX) IV  40 mg Intravenous Q12H   polyethylene glycol  17 g Oral Daily   rOPINIRole  0.25 mg Oral QHS   rosuvastatin  20 mg Oral QHS   timolol  1 drop Both Eyes q morning   Continuous Infusions:  lactated ringers 100 mL/hr at 10/04/21 0443     LOS: 2 days    Time spent: 35 minutes    Barb Merino, MD [facility] Pager [phone]

## 2021-10-04 NOTE — Plan of Care (Signed)
°  Problem: Clinical Measurements: Goal: Ability to maintain clinical measurements within normal limits will improve Outcome: Progressing Goal: Will remain free from infection Outcome: Progressing Goal: Diagnostic test results will improve Outcome: Progressing Goal: Respiratory complications will improve Outcome: Progressing Goal: Cardiovascular complication will be avoided Outcome: Progressing   Problem: Elimination: Goal: Will not experience complications related to bowel motility Outcome: Progressing   Pt is involved in and agrees with the plan of care. V/S stable. BM noted 1 x this shift with blood noted. Reports pain on her Left arm.

## 2021-10-05 DIAGNOSIS — D75838 Other thrombocytosis: Secondary | ICD-10-CM

## 2021-10-05 DIAGNOSIS — N184 Chronic kidney disease, stage 4 (severe): Secondary | ICD-10-CM

## 2021-10-05 LAB — GLUCOSE, CAPILLARY: Glucose-Capillary: 112 mg/dL — ABNORMAL HIGH (ref 70–99)

## 2021-10-05 MED ORDER — CLONIDINE HCL 0.1 MG PO TABS: 0.1000 mg | ORAL_TABLET | Freq: Two times a day (BID) | ORAL | 0 refills | Status: DC

## 2021-10-05 MED ORDER — POLYETHYLENE GLYCOL 3350 17 G PO PACK: 17.0000 g | PACK | Freq: Every day | ORAL | 0 refills | Status: DC

## 2021-10-05 NOTE — Care Management Important Message (Signed)
Important Message  Patient Details  Name: Cassandra [name] MRN: [id number] Date of Birth: 04-07-1927   Medicare Important Message Given:  Yes     Dannette Barbara 10/05/2021, 11:08 AM

## 2021-10-05 NOTE — Discharge Summary (Signed)
Physician Discharge Summary  Patient ID: Cassandra [name] MRN: [id number] DOB/AGE: 1927/07/11 88 y.o.  Admit date: 10/02/2021 Discharge date: 10/05/2021  Admission Diagnoses:  Discharge Diagnoses:  Principal Problem:   Bright red rectal bleeding Active Problems:   Essential hypertension   Restless legs syndrome   Reactive thrombocytosis   Acquired hypothyroidism   CKD (chronic kidney disease) stage 4, GFR 15-29 ml/min (HCC)   CAD (coronary artery disease), native coronary artery   Polycythemia vera (HCC)   GIB (gastrointestinal bleeding)   Rectal bleeding   Discharged Condition: fair  Hospital Course:  88 year old female with history of colon rupture during screening colonoscopy 10 years ago resulting in [place] procedure and subsequent closure, history of diverticulosis, CKD stage IV, coronary artery disease, polycythemia vera, type 2 diabetes not on treatment and essential hypertension presented to the ER with 2 episodes of blood mixed in stool with some abdominal cramping.  Patient is on aspirin at home.  In the emergency room blood pressure is elevated.  Otherwise hemodynamically stable.  Rectal exam as per ER records with fresh blood.  Hemoglobin is stable.  Bright red blood per rectum/lower GI bleed/clinically outlet bleeding also suspect diverticular bleed Patient hemoglobin has been stable, CT abdomen/pelvis showed diverticulosis, without diverticulitis. Hemoglobin has been stable since admission to the hospital. Patient had a severe adverse reaction after prior colonoscopy, does not want any work-up at this time. At this point, she is medically stable to be discharged.  Chronic kidney disease stage IV. Renal function stable.  Type 2 diabetes. Follow-up with PCP as outpatient  Essential hypertension Patient states that her blood pressure has been running high, she has run out her clonidine at home, I will schedule her clonidine 0.1 mg twice a day.  Consults:  None  Significant Diagnostic Studies:   Treatments: Clinical monitoring  Discharge Exam: Blood pressure (!) 189/93, pulse 74, temperature 98.4 F (36.9 C), temperature source Oral, resp. rate 19, height 5' (1.524 m), weight 65.8 kg, SpO2 95 %. General appearance: alert and cooperative Resp: clear to auscultation bilaterally Cardio: regular rate and rhythm, S1, S2 normal, no murmur, click, rub or gallop GI: soft, non-tender; bowel sounds normal; no masses,  no organomegaly Extremities: extremities normal, atraumatic, no cyanosis or edema  Disposition: Discharge disposition: 01-Home or Self Care       Discharge Instructions     Diet - low sodium heart healthy   Complete by: As directed    Increase activity slowly   Complete by: As directed       Allergies as of 10/05/2021       Reactions   Atorvastatin Other (See Comments)   MYALGIA   Cyclobenzaprine Other (See Comments)   Hydrocodone-acetaminophen    Other reaction(s): Hallucination when taking whole pill, pt tolerates taking 1/2 pill   Mirtazapine    Other reaction(s): Hallucination   Oxycodone-acetaminophen Other (See Comments)   Paroxetine Hcl Other (See Comments)   Penicillins Swelling   Lip and orbital swelling   Propoxyphene Other (See Comments)   Trazodone Other (See Comments)        Medication List     STOP taking these medications    aspirin EC 81 MG tablet   HYDROcodone-acetaminophen 5-325 MG tablet Commonly known as: NORCO/VICODIN       TAKE these medications    albuterol 108 (90 Base) MCG/ACT inhaler Commonly known as: VENTOLIN HFA Inhale into the lungs.   azelastine 0.1 % nasal spray Commonly known as: ASTELIN Place 2 sprays into  both nostrils 2 (two) times daily.   cetirizine 10 MG tablet Commonly known as: ZYRTEC Take 1 tablet by mouth daily as needed for allergies.   Cholecalciferol 25 MCG (1000 UT) tablet Take 1,000 Units by mouth daily.   cloNIDine 0.1 MG  tablet Commonly known as: Catapres Take 1 tablet (0.1 mg total) by mouth 2 (two) times daily. What changed:  when to take this reasons to take this   Droxia 300 MG capsule Generic drug: hydroxyurea TAKE 1 CAPSULE (300 MG TOTAL) BY MOUTH DAILY. MAY TAKE WITH FOOD TO MINIMIZE GI SIDE EFFECTS.   gabapentin 300 MG capsule Commonly known as: NEURONTIN Take 1 capsule by mouth 2 (two) times daily.   glucose blood test strip Use once daily. Use as instructed.   latanoprost 0.005 % ophthalmic solution Commonly known as: XALATAN Place 1 drop into both eyes at bedtime.   levothyroxine 88 MCG tablet Commonly known as: SYNTHROID Take 88 mcg by mouth daily. Take on an empty stomach 30 to 60 minutes before breakfast   Lotrel 5-20 MG capsule Generic drug: amLODipine-benazepril Take 1 capsule by mouth daily.   metoprolol succinate 50 MG 24 hr tablet Commonly known as: TOPROL-XL Take 1 tablet by mouth daily.   naloxone 4 MG/0.1ML Liqd nasal spray kit Commonly known as: NARCAN Place 1 spray into the nose once as needed. Place 1 spray (4 mg total) into one nostril once as needed (if not breathing.) for up to 1 dose   polyethylene glycol 17 g packet Commonly known as: MIRALAX / GLYCOLAX Take 17 g by mouth daily. Start taking on: October 06, 2021   rOPINIRole 0.25 MG tablet Commonly known as: REQUIP Take 0.25 mg by mouth at bedtime.   rosuvastatin 20 MG tablet Commonly known as: CRESTOR Take 20 mg by mouth daily.   timolol 0.5 % ophthalmic gel-forming Commonly known as: TIMOPTIC-XR Place 1 drop into both eyes every morning.        Follow-up Information     Cassandra Aus, MD Follow up in 1 week(s).   Specialty: Internal Medicine Contact information: [place] [address] [phone]                 Signed: Sharen Hones 10/05/2021, 10:43 AM

## 2021-10-12 ENCOUNTER — Emergency Department: Payer: Medicare Other

## 2021-10-12 ENCOUNTER — Inpatient Hospital Stay
Admission: EM | Admit: 2021-10-12 | Discharge: 2021-10-15 | DRG: 065 | Disposition: A | Discharge status: Home Health | Payer: Medicare Other | Attending: Student | Admitting: Student

## 2021-10-12 ENCOUNTER — Other Ambulatory Visit: Payer: Self-pay

## 2021-10-12 DIAGNOSIS — E119 Type 2 diabetes mellitus without complications: Secondary | ICD-10-CM

## 2021-10-12 DIAGNOSIS — Z87891 Personal history of nicotine dependence: Secondary | ICD-10-CM

## 2021-10-12 DIAGNOSIS — I2510 Atherosclerotic heart disease of native coronary artery without angina pectoris: Secondary | ICD-10-CM | POA: Diagnosis present

## 2021-10-12 DIAGNOSIS — G2581 Restless legs syndrome: Secondary | ICD-10-CM | POA: Diagnosis present

## 2021-10-12 DIAGNOSIS — D45 Polycythemia vera: Secondary | ICD-10-CM | POA: Diagnosis present

## 2021-10-12 DIAGNOSIS — Z79890 Hormone replacement therapy (postmenopausal): Secondary | ICD-10-CM

## 2021-10-12 DIAGNOSIS — I63541 Cerebral infarction due to unspecified occlusion or stenosis of right cerebellar artery: Principal | ICD-10-CM | POA: Diagnosis present

## 2021-10-12 DIAGNOSIS — R29711 NIHSS score 11: Secondary | ICD-10-CM | POA: Diagnosis present

## 2021-10-12 DIAGNOSIS — Z880 Allergy status to penicillin: Secondary | ICD-10-CM

## 2021-10-12 DIAGNOSIS — N184 Chronic kidney disease, stage 4 (severe): Secondary | ICD-10-CM | POA: Diagnosis present

## 2021-10-12 DIAGNOSIS — Z8249 Family history of ischemic heart disease and other diseases of the circulatory system: Secondary | ICD-10-CM

## 2021-10-12 DIAGNOSIS — Z885 Allergy status to narcotic agent status: Secondary | ICD-10-CM

## 2021-10-12 DIAGNOSIS — E1122 Type 2 diabetes mellitus with diabetic chronic kidney disease: Secondary | ICD-10-CM | POA: Diagnosis present

## 2021-10-12 DIAGNOSIS — Z955 Presence of coronary angioplasty implant and graft: Secondary | ICD-10-CM

## 2021-10-12 DIAGNOSIS — Z20822 Contact with and (suspected) exposure to covid-19: Secondary | ICD-10-CM | POA: Diagnosis present

## 2021-10-12 DIAGNOSIS — Z79899 Other long term (current) drug therapy: Secondary | ICD-10-CM

## 2021-10-12 DIAGNOSIS — R29810 Facial weakness: Secondary | ICD-10-CM | POA: Diagnosis present

## 2021-10-12 DIAGNOSIS — Z888 Allergy status to other drugs, medicaments and biological substances status: Secondary | ICD-10-CM

## 2021-10-12 DIAGNOSIS — E039 Hypothyroidism, unspecified: Secondary | ICD-10-CM | POA: Diagnosis present

## 2021-10-12 DIAGNOSIS — K922 Gastrointestinal hemorrhage, unspecified: Secondary | ICD-10-CM | POA: Diagnosis present

## 2021-10-12 DIAGNOSIS — E785 Hyperlipidemia, unspecified: Secondary | ICD-10-CM | POA: Diagnosis present

## 2021-10-12 DIAGNOSIS — I69354 Hemiplegia and hemiparesis following cerebral infarction affecting left non-dominant side: Secondary | ICD-10-CM

## 2021-10-12 DIAGNOSIS — Z9049 Acquired absence of other specified parts of digestive tract: Secondary | ICD-10-CM

## 2021-10-12 DIAGNOSIS — R4781 Slurred speech: Secondary | ICD-10-CM | POA: Diagnosis present

## 2021-10-12 DIAGNOSIS — D72829 Elevated white blood cell count, unspecified: Secondary | ICD-10-CM

## 2021-10-12 DIAGNOSIS — I10 Essential (primary) hypertension: Secondary | ICD-10-CM | POA: Diagnosis present

## 2021-10-12 DIAGNOSIS — Z8719 Personal history of other diseases of the digestive system: Secondary | ICD-10-CM

## 2021-10-12 DIAGNOSIS — I639 Cerebral infarction, unspecified: Principal | ICD-10-CM

## 2021-10-12 DIAGNOSIS — I129 Hypertensive chronic kidney disease with stage 1 through stage 4 chronic kidney disease, or unspecified chronic kidney disease: Secondary | ICD-10-CM | POA: Diagnosis present

## 2021-10-12 DIAGNOSIS — W57XXXA Bitten or stung by nonvenomous insect and other nonvenomous arthropods, initial encounter: Secondary | ICD-10-CM | POA: Diagnosis present

## 2021-10-12 LAB — DIFFERENTIAL
Abs Immature Granulocytes: 0.18 10*3/uL — ABNORMAL HIGH (ref 0.00–0.07)
Basophils Absolute: 0.1 10*3/uL (ref 0.0–0.1)
Basophils Relative: 0 %
Eosinophils Absolute: 0.3 10*3/uL (ref 0.0–0.5)
Eosinophils Relative: 2 %
Immature Granulocytes: 1 %
Lymphocytes Relative: 10 %
Lymphs Abs: 1.8 10*3/uL (ref 0.7–4.0)
Monocytes Absolute: 1.3 10*3/uL — ABNORMAL HIGH (ref 0.1–1.0)
Monocytes Relative: 8 %
Neutro Abs: 13.4 10*3/uL — ABNORMAL HIGH (ref 1.7–7.7)
Neutrophils Relative %: 79 %

## 2021-10-12 LAB — CBC
HCT: 47.1 % — ABNORMAL HIGH (ref 36.0–46.0)
Hemoglobin: 14.6 g/dL (ref 12.0–15.0)
MCH: 28.9 pg (ref 26.0–34.0)
MCHC: 31 g/dL (ref 30.0–36.0)
MCV: 93.3 fL (ref 80.0–100.0)
Platelets: 603 10*3/uL — ABNORMAL HIGH (ref 150–400)
RBC: 5.05 MIL/uL (ref 3.87–5.11)
RDW: 18.5 % — ABNORMAL HIGH (ref 11.5–15.5)
WBC: 17 10*3/uL — ABNORMAL HIGH (ref 4.0–10.5)
nRBC: 0 % (ref 0.0–0.2)

## 2021-10-12 LAB — PROTIME-INR
INR: 1 (ref 0.8–1.2)
Prothrombin Time: 13.3 seconds (ref 11.4–15.2)

## 2021-10-12 LAB — APTT: aPTT: 36 seconds (ref 24–36)

## 2021-10-12 LAB — COMPREHENSIVE METABOLIC PANEL
ALT: 12 U/L (ref 0–44)
AST: 27 U/L (ref 15–41)
Albumin: 2.8 g/dL — ABNORMAL LOW (ref 3.5–5.0)
Alkaline Phosphatase: 55 U/L (ref 38–126)
Anion gap: 7 (ref 5–15)
BUN: 20 mg/dL (ref 8–23)
CO2: 21 mmol/L — ABNORMAL LOW (ref 22–32)
Calcium: 8.3 mg/dL — ABNORMAL LOW (ref 8.9–10.3)
Chloride: 109 mmol/L (ref 98–111)
Creatinine, Ser: 2.44 mg/dL — ABNORMAL HIGH (ref 0.44–1.00)
GFR, Estimated: 18 mL/min — ABNORMAL LOW (ref >60)
Glucose, Bld: 132 mg/dL — ABNORMAL HIGH (ref 70–99)
Potassium: 4.7 mmol/L (ref 3.5–5.1)
Sodium: 137 mmol/L (ref 135–145)
Total Bilirubin: 0.7 mg/dL (ref 0.3–1.2)
Total Protein: 6.8 g/dL (ref 6.5–8.1)

## 2021-10-12 LAB — CBG MONITORING, ED: Glucose-Capillary: 138 mg/dL — ABNORMAL HIGH (ref 70–99)

## 2021-10-12 NOTE — ED Notes (Signed)
Family reports pt with return of slurred speech; pt to CT via w/c accomp by Jasmine Awe RN; charge nurse notified and acuity level changed

## 2021-10-12 NOTE — ED Notes (Signed)
Pt noted with resolved symptoms at this time ,  facial symmetry, speech clear . + movement to all limbs .  Pt passed swallow screen at this time, tolerated PO fluids without coughing/choking/respi distress. ERMD made aware

## 2021-10-12 NOTE — ED Notes (Signed)
Activated code stroke w/[facility] @ 9:02pm

## 2021-10-12 NOTE — Progress Notes (Signed)
Ch blessed to be with Pt and spouse, who share an incredible bond.We talked of family, Catholicism (theirs), & [place] (mutual). 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 :)

## 2021-10-12 NOTE — ED Provider Notes (Signed)
[facility] Provider Note    Event Date/Time   First MD Initiated Contact with Patient 10/12/21 2051     (approximate)   History   Transient Ischemic Attack   HPI  Cassandra [name] is a 88 y.o. female patient with recent admission to the hospital for GI bleed not currently on any anticoagulation presents to the ER for evaluation of left-sided weakness slurred speech and facial droop that started around 730 this evening.  LSN 600. Reportedly symptoms got better on arrival with EMS but while in the waiting room started developing slurred speech again and has recurrence of the left-sided weakness.  She was brought back immediately to the ER code stroke was called.     Physical Exam   Triage Vital Signs: ED Triage Vitals  Enc Vitals Group     BP 10/12/21 2016 (!) 146/89     Pulse Rate 10/12/21 2016 76     Resp 10/12/21 2016 20     Temp 10/12/21 2016 98.4 F (36.9 C)     Temp Source 10/12/21 2016 Oral     SpO2 10/12/21 2016 96 %     Weight 10/12/21 2017 144 lb (65.3 kg)     Height 10/12/21 2017 5' (1.524 m)     Head Circumference --      Peak Flow --      Pain Score 10/12/21 2017 0     Pain Loc --      Pain Edu? --      Excl. in [place]? --     Most recent vital signs: Vitals:   10/12/21 2145 10/12/21 2255  BP: (!) 156/77 (!) 183/87  Pulse: 73   Resp: 20 16  Temp:    SpO2: 94% 95%     Constitutional: Alert  Eyes: Conjunctivae are normal.  Head: Atraumatic. Nose: No congestion/rhinnorhea. Mouth/Throat: Mucous membranes are moist.   Neck: Painless ROM.  Cardiovascular:   Good peripheral circulation. Respiratory: Normal respiratory effort.  No retractions.  Gastrointestinal: Soft and nontender.  Musculoskeletal:  no deformity Neurologic: There is left-sided weakness and drift unable to hold up against gravity left upper extremity.  Significant weakness in the left leg.  Trace left facial droop no slurred speech at this time. Skin:  Skin is  warm, dry and intact. No rash noted. Psychiatric: Mood and affect are normal. Speech and behavior are normal.    ED Results / Procedures / Treatments   Labs (all labs ordered are listed, but only abnormal results are displayed) Labs Reviewed  CBC - Abnormal; Notable for the following components:      Result Value   WBC 17.0 (*)    HCT 47.1 (*)    RDW 18.5 (*)    Platelets 603 (*)    All other components within normal limits  DIFFERENTIAL - Abnormal; Notable for the following components:   Neutro Abs 13.4 (*)    Monocytes Absolute 1.3 (*)    Abs Immature Granulocytes 0.18 (*)    All other components within normal limits  COMPREHENSIVE METABOLIC PANEL - Abnormal; Notable for the following components:   CO2 21 (*)    Glucose, Bld 132 (*)    Creatinine, Ser 2.44 (*)    Calcium 8.3 (*)    Albumin 2.8 (*)    GFR, Estimated 18 (*)    All other components within normal limits  CBG MONITORING, ED - Abnormal; Notable for the following components:   Glucose-Capillary 138 (*)  All other components within normal limits  RESP PANEL BY RT-PCR (FLU A&B, COVID) ARPGX2  PROTIME-INR  APTT  URINALYSIS, COMPLETE (UACMP) WITH MICROSCOPIC     EKG  ED ECG REPORT I, Merlyn Lot, the attending physician, personally viewed and interpreted this ECG.   Date: 10/12/2021  EKG Time: 20:30  Rate: 80  Rhythm: sinus  Axis: left  Intervals:normal qt  ST&T Change: no stemi, no depression    RADIOLOGY Please see ED Course for my review and interpretation.  I personally reviewed all radiographic images ordered to evaluate for the above acute complaints and reviewed radiology reports and findings.  These findings were personally discussed with the patient.  Please see medical record for radiology report.    PROCEDURES:  Critical Care performed: No  Procedures   MEDICATIONS ORDERED IN ED: Medications - No data to display   IMPRESSION / MDM / [place] / ED COURSE  I  reviewed the triage vital signs and the nursing notes.                              Differential diagnosis includes, but is not limited to, cva, tia, hypoglycemia, dehydration, electrolyte abnormality, dissection, sepsis  Patient presenting with symptoms as described above.  She is within the window therefore code stroke was called however on review of her past medical history she was recently admitted for GI bleed therefore do not believe she would be a candidate for TNKase.  CT imaging by review does not show any evidence of subdural hematoma or bleed.  Her glucose is normal.  BMP does have renal insufficiency but normal potassium.   Clinical Course as of 10/12/21 2309  Wed Oct 12, 2021  2130 Consult with neurology.  Patient is not a candidate for tPA or TN K due to recent admission for GI bleed.  She has an NIH of 11 but presentation further complicated by her renal dysfunction and she would not be a good candidate for CTA.  We are able to fairly quickly obtain MRI/MRA which I have ordered. [PR]  2306 MRI results still pending.  Patient will be signed out to oncoming physician pending follow-up MRI.  Patient will require admission to the hospital. [PR]    Clinical Course User Index [PR] Merlyn Lot, MD     FINAL CLINICAL IMPRESSION(S) / ED DIAGNOSES   Final diagnoses:  Cerebrovascular accident (CVA), unspecified mechanism ([place])     Rx / DC Orders   ED Discharge Orders     None        Note:  This document was prepared using Dragon voice recognition software and may include unintentional dictation errors.    Merlyn Lot, MD 10/12/21 [phone]

## 2021-10-12 NOTE — ED Notes (Addendum)
DR DUOYON  FOR TELE NEURO CONSULT FOR THIS PT AT THIS TIME.   NO TNK FOR THIS PT

## 2021-10-12 NOTE — ED Notes (Signed)
Pt BIB EMS for TIA.  EMS reports slurred speech and left side weakness from home that occurred twice but both times resolved prior to EMS arrival.  Pt was assessed by charge RN upon arrival and noted to be A&Ox4, clear speech, strong equal grip and strength x4, denying numbness.

## 2021-10-12 NOTE — ED Triage Notes (Signed)
Pt to ED via [facility] with c/o when she got up to go to the bathroom she could not get her left leg to work, around [place] her husband came in and she had garbled speech, they called 911 and by the time EMS got to her the symptoms had resolved.

## 2021-10-12 NOTE — Consult Note (Addendum)
[facility] TeleNeurology Consult Services   Patient Name:   Ruwayda, Curet Date of Birth:   27-Jul-1927 Identification Number:   MRN - [id number] Date of Service:   10/12/2021 21:04:32  Diagnosis:       I63.9 - Cerebrovascular accident (CVA), unspecified mechanism ([place])  Impression:      Mrs. Lobb is a 88 year-old woman with a PMH of HTN, HLD, CAD, RLS, Polycythemia Vera, GIB (09/2021), CKD who presents with left facial droop and left sided weakness. TNK was not given due to recent GIB. Given current creatinine and history of CKD, CTAs were not done. Will obtain STAT MRI Brain and MRA Head Neck to assess for LVO and possible NIR evaluation.   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 Discussed findings with Neurologist at [facility] via [place] on 10/13/2021 at [place] at the recommendation of NIR. They will not intervene.   Metrics: Last Known Well: 10/12/2021 18:00:00 [facility] Notification Time: 10/12/2021 21:04:32 Arrival Time: 10/12/2021 20:45:00 Stamp Time: 10/12/2021 21:04:32 Initial Response Time: 10/12/2021 21:06:12 Symptoms: Left sided weakness; left facial droop. NIHSS Start Assessment Time: 10/12/2021 21:10:54 Patient is not a candidate for Thrombolytic. Thrombolytic Medical Decision: 10/12/2021 21:10:56 Patient was not deemed candidate for Thrombolytic because of following reasons: GI Bleeding (Within 21 Days).  CT head showed no acute hemorrhage or acute core infarct.  ED Physician notified of diagnostic impression and management plan on 10/12/2021 21:35:00  Advanced Imaging: Advanced Imaging Not Completed because:  Due to CKD and elevated creatinine CTA not done. Will obtain STAT MRI Brain and MRA Head and Neck.   Our recommendations are outlined below.  Recommendations:        Stroke/Telemetry Floor       Neuro Checks       Bedside Swallow Eval       DVT Prophylaxis       IV Fluids, Normal Saline       Head  of Bed 30 Degrees       Euglycemia and Avoid Hyperthermia (PRN Acetaminophen)       Initiate or continue Aspirin 325 MG daily       Antihypertensives PRN if Blood pressure is greater than 220/120 or there is a concern for End organ damage/contraindications for permissive HTN. If blood pressure is greater than 220/120 give labetalol PO or IV or Vasotec IV with a goal of 15% reduction in BP during the first 24 hours.       - MRI Brain without contrast STAT       - MRA Head and Neck stroke protocol STAT to assess for LVO and possible NIR evaluation       - TTE       - Labs: Lipid panel; HgbA1c       - PT/OT/ST where applicable  Routine Consultation with [facility] for Follow up Care  Sign Out:       Discussed with Emergency Department Provider    ------------------------------------------------------------------------------  History of Present Illness: Patient is a 88 year old Female.  Patient was brought by private transportation with symptoms of Left sided weakness; left facial droop. Mrs. Tregoning is a 88 year-old woman with a PMH of HTN, HLD, CAD, RLS, Polycythemia Vera, GIB (09/2021), CKD who presents with left facial droop and left sided weakness. As per ER staff, Mrs. Schadler was cooking, she went to lay down and her husband found her with a left facial droop and left sided weakness. Her symptoms resolved. When she was  in triage the left facial and left sided weakness came back. Mrs. Marchese states that she was fine and then she suddenly couldn't use her left side. She denies ever having had anything like this before. As per the records, Mrs. Spadoni was discharged from the hospital on 10/05/2021 without ASA due to GIB.   Past Medical History:      Hypertension      Hyperlipidemia  Medications:  No Anticoagulant use  No Antiplatelet use Reviewed EMR for current medications  Allergies:  Reviewed Description: PCN, Percocet, Lipitor, Trazadone  Social History: Drug  Use: No  Family History:  There is no family history of premature cerebrovascular disease pertinent to this consultation  ROS : 14 Points Review of Systems was performed and was negative except mentioned in HPI.  Past Surgical History: There Is No Surgical History Contributory To Todays Visit    Examination: BP(164/82), Pulse(70), 1A: Level of Consciousness - Alert; keenly responsive + 0 1B: Ask Month and Age - Both Questions Right + 0 1C: Blink Eyes & Squeeze Hands - Performs Both Tasks + 0 2: Test Horizontal Extraocular Movements - Normal + 0 3: Test Visual Fields - No Visual Loss + 0 4: Test Facial Palsy (Use Grimace if Obtunded) - Partial paralysis (lower face) + 2 5A: Test Left Arm Motor Drift - No Movement + 4 5B: Test Right Arm Motor Drift - No Drift for 10 Seconds + 0 6A: Test Left Leg Motor Drift - No Movement + 4 6B: Test Right Leg Motor Drift - No Drift for 5 Seconds + 0 7: Test Limb Ataxia (FNF/Heel-Shin) - No Ataxia + 0 8: Test Sensation - Normal; No sensory loss + 0 9: Test Language/Aphasia - Normal; No aphasia + 0 10: Test Dysarthria - Mild-Moderate Dysarthria: Slurring but can be understood + 1 11: Test Extinction/Inattention - No abnormality + 0  NIHSS Score: 11   Pre-Morbid Modified Rankin Scale: 0 Points = No symptoms at all   Patient/Family was informed the Neurology Consult would occur via TeleHealth consult by way of interactive audio and video telecommunications and consented to receiving care in this manner.   Patient is being evaluated for possible acute neurologic impairment and high probability of imminent or life-threatening deterioration. I spent total of 50 minutes providing care to this patient, including time for face to face visit via telemedicine, review of medical records, imaging studies and discussion of findings with providers, the patient and/or family.   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 Discussed findings with Neurologist at [facility] via [place] on 10/13/2021 at [place] at the recommendation of NIR. They will not intervene.

## 2021-10-12 NOTE — ED Notes (Signed)
Pt was out in the waiting area and held up her right hand, CNA went over to see what she had needed and she said that she did not feel right and was having slurred speech. Her left eye and left side of mouth were dropping. Her left arm and leg were flaccid. Pt was taken over to CT

## 2021-10-13 DIAGNOSIS — I2510 Atherosclerotic heart disease of native coronary artery without angina pectoris: Secondary | ICD-10-CM | POA: Diagnosis present

## 2021-10-13 DIAGNOSIS — Z880 Allergy status to penicillin: Secondary | ICD-10-CM | POA: Diagnosis not present

## 2021-10-13 DIAGNOSIS — I69354 Hemiplegia and hemiparesis following cerebral infarction affecting left non-dominant side: Secondary | ICD-10-CM | POA: Diagnosis not present

## 2021-10-13 DIAGNOSIS — N184 Chronic kidney disease, stage 4 (severe): Secondary | ICD-10-CM | POA: Diagnosis present

## 2021-10-13 DIAGNOSIS — I639 Cerebral infarction, unspecified: Secondary | ICD-10-CM

## 2021-10-13 DIAGNOSIS — Z885 Allergy status to narcotic agent status: Secondary | ICD-10-CM | POA: Diagnosis not present

## 2021-10-13 DIAGNOSIS — Z20822 Contact with and (suspected) exposure to covid-19: Secondary | ICD-10-CM | POA: Diagnosis present

## 2021-10-13 DIAGNOSIS — R29711 NIHSS score 11: Secondary | ICD-10-CM | POA: Diagnosis present

## 2021-10-13 DIAGNOSIS — Z79890 Hormone replacement therapy (postmenopausal): Secondary | ICD-10-CM | POA: Diagnosis not present

## 2021-10-13 DIAGNOSIS — Z87891 Personal history of nicotine dependence: Secondary | ICD-10-CM | POA: Diagnosis not present

## 2021-10-13 DIAGNOSIS — K922 Gastrointestinal hemorrhage, unspecified: Secondary | ICD-10-CM | POA: Diagnosis present

## 2021-10-13 DIAGNOSIS — Z9049 Acquired absence of other specified parts of digestive tract: Secondary | ICD-10-CM | POA: Diagnosis not present

## 2021-10-13 DIAGNOSIS — Z8719 Personal history of other diseases of the digestive system: Secondary | ICD-10-CM | POA: Diagnosis not present

## 2021-10-13 DIAGNOSIS — E039 Hypothyroidism, unspecified: Secondary | ICD-10-CM | POA: Diagnosis present

## 2021-10-13 DIAGNOSIS — W57XXXA Bitten or stung by nonvenomous insect and other nonvenomous arthropods, initial encounter: Secondary | ICD-10-CM | POA: Diagnosis present

## 2021-10-13 DIAGNOSIS — Z79899 Other long term (current) drug therapy: Secondary | ICD-10-CM | POA: Diagnosis not present

## 2021-10-13 DIAGNOSIS — Z955 Presence of coronary angioplasty implant and graft: Secondary | ICD-10-CM | POA: Diagnosis not present

## 2021-10-13 DIAGNOSIS — R4781 Slurred speech: Secondary | ICD-10-CM | POA: Diagnosis present

## 2021-10-13 DIAGNOSIS — I10 Essential (primary) hypertension: Secondary | ICD-10-CM | POA: Diagnosis not present

## 2021-10-13 DIAGNOSIS — Z8249 Family history of ischemic heart disease and other diseases of the circulatory system: Secondary | ICD-10-CM | POA: Diagnosis not present

## 2021-10-13 DIAGNOSIS — E785 Hyperlipidemia, unspecified: Secondary | ICD-10-CM | POA: Diagnosis present

## 2021-10-13 DIAGNOSIS — R29810 Facial weakness: Secondary | ICD-10-CM | POA: Diagnosis present

## 2021-10-13 DIAGNOSIS — E1122 Type 2 diabetes mellitus with diabetic chronic kidney disease: Secondary | ICD-10-CM | POA: Diagnosis present

## 2021-10-13 DIAGNOSIS — I63541 Cerebral infarction due to unspecified occlusion or stenosis of right cerebellar artery: Secondary | ICD-10-CM | POA: Diagnosis present

## 2021-10-13 DIAGNOSIS — Z888 Allergy status to other drugs, medicaments and biological substances status: Secondary | ICD-10-CM | POA: Diagnosis not present

## 2021-10-13 DIAGNOSIS — D72829 Elevated white blood cell count, unspecified: Secondary | ICD-10-CM

## 2021-10-13 DIAGNOSIS — G2581 Restless legs syndrome: Secondary | ICD-10-CM | POA: Diagnosis present

## 2021-10-13 DIAGNOSIS — D45 Polycythemia vera: Secondary | ICD-10-CM | POA: Diagnosis present

## 2021-10-13 DIAGNOSIS — I129 Hypertensive chronic kidney disease with stage 1 through stage 4 chronic kidney disease, or unspecified chronic kidney disease: Secondary | ICD-10-CM | POA: Diagnosis present

## 2021-10-13 LAB — LIPID PANEL
Cholesterol: 176 mg/dL (ref 0–200)
HDL: 44 mg/dL (ref >40)
LDL Cholesterol: 115 mg/dL — ABNORMAL HIGH (ref 0–99)
Total CHOL/HDL Ratio: 4 RATIO
Triglycerides: 83 mg/dL (ref <150)
VLDL: 17 mg/dL (ref 0–40)

## 2021-10-13 LAB — TSH: TSH: 12.489 u[IU]/mL — ABNORMAL HIGH (ref 0.350–4.500)

## 2021-10-13 LAB — RESP PANEL BY RT-PCR (FLU A&B, COVID) ARPGX2
Influenza A by PCR: NEGATIVE
Influenza B by PCR: NEGATIVE
SARS Coronavirus 2 by RT PCR: NEGATIVE

## 2021-10-13 LAB — URINALYSIS, COMPLETE (UACMP) WITH MICROSCOPIC
Bilirubin Urine: NEGATIVE
Glucose, UA: 50 mg/dL — AB
Ketones, ur: NEGATIVE mg/dL
Nitrite: NEGATIVE
Protein, ur: >=300 mg/dL — AB
Specific Gravity, Urine: 1.005 (ref 1.005–1.030)
pH: 7 (ref 5.0–8.0)

## 2021-10-13 LAB — GLUCOSE, CAPILLARY
Glucose-Capillary: 110 mg/dL — ABNORMAL HIGH (ref 70–99)
Glucose-Capillary: 125 mg/dL — ABNORMAL HIGH (ref 70–99)
Glucose-Capillary: 123 mg/dL — ABNORMAL HIGH (ref 70–99)

## 2021-10-13 LAB — HEMOGLOBIN A1C
Hgb A1c MFr Bld: 6.4 % — ABNORMAL HIGH (ref 4.8–5.6)
Hgb A1c MFr Bld: 6.4 % — ABNORMAL HIGH (ref 4.8–5.6)
Mean Plasma Glucose: 136.98 mg/dL
Mean Plasma Glucose: 136.98 mg/dL

## 2021-10-13 LAB — CBC
HCT: 43.4 % (ref 36.0–46.0)
Hemoglobin: 13.2 g/dL (ref 12.0–15.0)
MCH: 27.9 pg (ref 26.0–34.0)
MCHC: 30.4 g/dL (ref 30.0–36.0)
MCV: 91.8 fL (ref 80.0–100.0)
Platelets: 557 10*3/uL — ABNORMAL HIGH (ref 150–400)
RBC: 4.73 MIL/uL (ref 3.87–5.11)
RDW: 18.3 % — ABNORMAL HIGH (ref 11.5–15.5)
WBC: 15.2 10*3/uL — ABNORMAL HIGH (ref 4.0–10.5)
nRBC: 0 % (ref 0.0–0.2)

## 2021-10-13 LAB — VITAMIN D 25 HYDROXY (VIT D DEFICIENCY, FRACTURES): Vit D, 25-Hydroxy: 25.4 ng/mL — ABNORMAL LOW (ref 30–100)

## 2021-10-13 LAB — FOLATE: Folate: 13.4 ng/mL (ref >5.9)

## 2021-10-13 LAB — CREATININE, SERUM
Creatinine, Ser: 2.45 mg/dL — ABNORMAL HIGH (ref 0.44–1.00)
GFR, Estimated: 18 mL/min — ABNORMAL LOW (ref >60)

## 2021-10-13 LAB — IRON AND TIBC
Iron: 35 ug/dL (ref 28–170)
Saturation Ratios: 14 % (ref 10.4–31.8)
TIBC: 256 ug/dL (ref 250–450)
UIBC: 221 ug/dL

## 2021-10-13 LAB — VITAMIN B12: Vitamin B-12: 324 pg/mL (ref 180–914)

## 2021-10-13 LAB — T4, FREE: Free T4: 1.03 ng/dL (ref 0.61–1.12)

## 2021-10-13 MED ORDER — ACETAMINOPHEN 325 MG PO TABS: 650.0000 mg | ORAL_TABLET | ORAL | Status: DC | PRN

## 2021-10-13 MED ORDER — STROKE: EARLY STAGES OF RECOVERY BOOK: Freq: Once | Status: AC

## 2021-10-13 MED ORDER — TIMOLOL MALEATE 0.5 % OP SOLG: 1.0000 [drp] | Freq: Every morning | OPHTHALMIC | Status: DC

## 2021-10-13 MED ORDER — SODIUM BICARBONATE 650 MG PO TABS
650.0000 mg | ORAL_TABLET | Freq: Two times a day (BID) | ORAL | Status: DC
  Administered 2021-10-13 – 2021-10-15 (×5): 650 mg via ORAL
  Filled 2021-10-13 (×6): qty 1

## 2021-10-13 MED ORDER — ENOXAPARIN SODIUM 40 MG/0.4ML IJ SOSY: 40.0000 mg | PREFILLED_SYRINGE | INTRAMUSCULAR | Status: DC

## 2021-10-13 MED ORDER — TIMOLOL MALEATE 0.5 % OP SOLN
1.0000 [drp] | Freq: Every day | OPHTHALMIC | Status: DC
  Filled 2021-10-13: qty 5

## 2021-10-13 MED ORDER — HEPARIN SODIUM (PORCINE) 5000 UNIT/ML IJ SOLN
5000.0000 [IU] | Freq: Three times a day (TID) | INTRAMUSCULAR | Status: DC
  Administered 2021-10-13 – 2021-10-15 (×6): 5000 [IU] via SUBCUTANEOUS
  Filled 2021-10-13 (×6): qty 1

## 2021-10-13 MED ORDER — ASPIRIN EC 81 MG PO TBEC
81.0000 mg | DELAYED_RELEASE_TABLET | Freq: Every day | ORAL | Status: DC
  Administered 2021-10-13 – 2021-10-15 (×3): 81 mg via ORAL
  Filled 2021-10-13 (×3): qty 1

## 2021-10-13 MED ORDER — INSULIN ASPART 100 UNIT/ML IJ SOLN
0.0000 [IU] | Freq: Three times a day (TID) | INTRAMUSCULAR | Status: DC
  Filled 2021-10-13: qty 1

## 2021-10-13 MED ORDER — LEVOTHYROXINE SODIUM 88 MCG PO TABS
88.0000 ug | ORAL_TABLET | Freq: Every day | ORAL | Status: DC
  Administered 2021-10-13 – 2021-10-15 (×3): 88 ug via ORAL
  Filled 2021-10-13 (×3): qty 1

## 2021-10-13 MED ORDER — ROSUVASTATIN CALCIUM 20 MG PO TABS
20.0000 mg | ORAL_TABLET | Freq: Every day | ORAL | Status: DC
  Administered 2021-10-13 – 2021-10-15 (×3): 20 mg via ORAL
  Filled 2021-10-13 (×3): qty 1

## 2021-10-13 MED ORDER — ACETAMINOPHEN 650 MG RE SUPP: 650.0000 mg | RECTAL | Status: DC | PRN

## 2021-10-13 MED ORDER — PANTOPRAZOLE SODIUM 40 MG PO TBEC
40.0000 mg | DELAYED_RELEASE_TABLET | Freq: Every day | ORAL | Status: DC
  Administered 2021-10-13 – 2021-10-15 (×3): 40 mg via ORAL
  Filled 2021-10-13 (×3): qty 1

## 2021-10-13 MED ORDER — SODIUM CHLORIDE 0.9 % IV SOLN: INTRAVENOUS | Status: DC

## 2021-10-13 MED ORDER — INSULIN ASPART 100 UNIT/ML IJ SOLN: 0.0000 [IU] | Freq: Every day | INTRAMUSCULAR | Status: DC

## 2021-10-13 MED ORDER — LATANOPROST 0.005 % OP SOLN
1.0000 [drp] | Freq: Every day | OPHTHALMIC | Status: DC
  Administered 2021-10-13 – 2021-10-14 (×2): 1 [drp] via OPHTHALMIC
  Filled 2021-10-13: qty 2.5

## 2021-10-13 MED ORDER — ROPINIROLE HCL 0.25 MG PO TABS
0.2500 mg | ORAL_TABLET | Freq: Every day | ORAL | Status: DC
  Administered 2021-10-13 – 2021-10-14 (×3): 0.25 mg via ORAL
  Filled 2021-10-13 (×5): qty 1

## 2021-10-13 MED ORDER — ALBUTEROL SULFATE (2.5 MG/3ML) 0.083% IN NEBU: 2.5000 mg | INHALATION_SOLUTION | RESPIRATORY_TRACT | Status: DC | PRN

## 2021-10-13 MED ORDER — ACETAMINOPHEN 160 MG/5ML PO SOLN
650.0000 mg | ORAL | Status: DC | PRN
  Filled 2021-10-13: qty 20.3

## 2021-10-13 NOTE — Evaluation (Signed)
Physical Therapy Evaluation Patient Details Name: Cassandra [name] MRN: [id number] DOB: 1927-09-09 Today's Date: 10/13/2021  History of Present Illness  Cassandra [name] is a 88 y.o. female patient with recent admission to the hospital for GI bleed not currently on any anticoagulation presents to the ER for evaluation of left-sided weakness slurred speech and facial droop that started around 730 this evening. PMH of HTN, HLD, CAD, RLS, Polycythemia Vera.   Clinical Impression  Pt admitted with above diagnosis. Pt received sitting upright in bed alert with clear speech. Agreeable to PT. Able to report PLOF, home lay out, DME, without difficulty. Pt mod-I at baseline with ambulating with 4WW, and for ADL's/IADL's. Reports husband drives and assists with household tasks as needed. Pt able to transfer to EOB without difficulty but relies on HOB elevated. Pt reports bed at home assists with elevation. CVA testing performed, see below. Overall minor weakness appreciated on L side compared to R proximally with hip/shoulder flexion and grip strength on L hand. Pt able to stand to RW with minguard and safe hand placement ambulating 28' with short step lengths but reciprocal gait at a decreased cadence to her baseline. Pt remains stable with no LOB, truncal lean and safe sequencing with turns to return to recliner. Pt declining OP PT for balance/strength due to CVA however endorsing HH PT. From PT perspective, pt is transferring and ambulating safely with AD and has needed equipment to return home with success. Pt will benefit from [facility] PT at d/c to progress balance, strength, and OOB Mobility to return to PLOF. Pt currently with functional limitations due to the deficits listed below (see PT Problem List). Pt will benefit from skilled PT to increase their independence and safety with mobility to allow discharge to the venue listed below.     Recommendations for follow up therapy are one component of a  multi-disciplinary discharge planning process, led by the attending physician.  Recommendations may be updated based on patient status, additional functional criteria and insurance authorization.  Follow Up Recommendations Home health PT    Assistance Recommended at Discharge Frequent or constant Supervision/Assistance  Patient can return home with the following  A little help with walking and/or transfers;Assistance with cooking/housework;Assist for transportation;A little help with bathing/dressing/bathroom;Help with stairs or ramp for entrance    Equipment Recommendations BSC/3in1  Recommendations for Other Services       Functional Status Assessment Patient has had a recent decline in their functional status and demonstrates the ability to make significant improvements in function in a reasonable and predictable amount of time.     Precautions / Restrictions Precautions Precautions: Fall Restrictions Weight Bearing Restrictions: No      Mobility  Bed Mobility Overal bed mobility: Needs Assistance Bed Mobility: Supine to Sit     Supine to sit: HOB elevated, Supervision     General bed mobility comments: no need for bed rails, relies on bed elevated to attain sitting EOB. Patient Response: Cooperative  Transfers Overall transfer level: Needs assistance Equipment used: Rolling walker (2 wheels) Transfers: Sit to/from Stand Sit to Stand: Min guard           General transfer comment: safe hand placement to stand to RW    Ambulation/Gait Ambulation/Gait assistance: Min guard Gait Distance (Feet): 28 Feet Assistive device: Rolling walker (2 wheels) Gait Pattern/deviations: Step-through pattern, Narrow base of support, Decreased step length - left, Decreased step length - right       General Gait Details: Slow gait, but consistent  step through pattern however limited foot clearance bilaterally and decreased step length bilat  Stairs            Wheelchair  Mobility    Modified Rankin (Stroke Patients Only)       Balance Overall balance assessment: Needs assistance Sitting-balance support: No upper extremity supported, Feet supported Sitting balance-Leahy Scale: Good Sitting balance - Comments: Able to perform MMT, coordination, RAMPs without LOB   Standing balance support: Reliant on assistive device for balance, During functional activity Standing balance-Leahy Scale: Fair Standing balance comment: Use of RW                             Pertinent Vitals/Pain Pain Assessment Pain Assessment: No/denies pain    Home Living Family/patient expects to be discharged to:: Private residence Living Arrangements: Spouse/significant other Available Help at Discharge: Family Type of Home: Apartment Home Access: Level entry       Home Layout: One level Home Equipment: Rollator (4 wheels);Shower seat - built in      Prior Function Prior Level of Function : Independent/Modified Independent             Mobility Comments: use of rollator and sometimes no AD needed       Hand Dominance   Dominant Hand: Right    Extremity/Trunk Assessment   Upper Extremity Assessment Upper Extremity Assessment: Generalized weakness;LUE deficits/detail LUE Deficits / Details: Decreased grip strength compared to R side, decreased shoulder flexion compared to R side, 3-/5 MMT LUE Sensation: WNL LUE Coordination: WNL    Lower Extremity Assessment Lower Extremity Assessment: Generalized weakness;RLE deficits/detail;LLE deficits/detail RLE Sensation: WNL RLE Coordination: WNL LLE Deficits / Details: L sided weakness proximally at hip > R side in hip flexion 3/5 on L side, 4/5 on R LLE Sensation: WNL LLE Coordination: WNL    Cervical / Trunk Assessment Cervical / Trunk Assessment: Normal  Communication   Communication: No difficulties  Cognition Arousal/Alertness: Awake/alert Behavior During Therapy: WFL for tasks  assessed/performed Overall Cognitive Status: Within Functional Limits for tasks assessed                                          General Comments General comments (skin integrity, edema, etc.): HR in v tach during mobility ranging from 80's BPM at rest to 117 BPM    Exercises Other Exercises Other Exercises: Role of PT in acute setting, D/c recs, DME recs. Peripheral vision intact bilat, pronator drift positive on L side, RAMPS WNL bilat for alternating foot taps and pro/sup of forearms.   Assessment/Plan    PT Assessment Patient needs continued PT services  PT Problem List Decreased strength;Decreased activity tolerance;Decreased balance;Decreased mobility       PT Treatment Interventions DME instruction;Balance training;Gait training;Neuromuscular re-education;Functional mobility training;Patient/family education;Therapeutic activities;Therapeutic exercise    PT Goals (Current goals can be found in the Care Plan section)  Acute Rehab PT Goals Patient Stated Goal: to go home PT Goal Formulation: With patient Time For Goal Achievement: 10/27/21 Potential to Achieve Goals: Good    Frequency 7X/week     Co-evaluation               AM-PAC PT "6 Clicks" Mobility  Outcome Measure Help needed turning from your back to your side while in a flat bed without using bedrails?: A Little Help needed  moving from lying on your back to sitting on the side of a flat bed without using bedrails?: A Little Help needed moving to and from a bed to a chair (including a wheelchair)?: A Little Help needed standing up from a chair using your arms (e.g., wheelchair or bedside chair)?: A Little Help needed to walk in hospital room?: A Little Help needed climbing 3-5 steps with a railing? : A Lot 6 Click Score: 17    End of Session Equipment Utilized During Treatment: Gait belt Activity Tolerance: Patient tolerated treatment well Patient left: in chair;with call bell/phone  within reach;with chair alarm set Nurse Communication: Mobility status PT Visit Diagnosis: Unsteadiness on feet (R26.81);Difficulty in walking, not elsewhere classified (R26.2);Muscle weakness (generalized) (M62.81)    Time: 6256-3893 PT Time Calculation (min) (ACUTE ONLY): 34 min   Charges:   PT Evaluation $PT Eval Moderate Complexity: 1 Mod PT Treatments $Gait Training: 8-22 mins       Salem Caster. Fairly IV, PT, DPT Physical Therapist- [facility]  10/13/2021, 12:09 PM

## 2021-10-13 NOTE — Evaluation (Signed)
Occupational Therapy Evaluation Patient Details Name: Joniece Smotherman MRN: [id number] DOB: 11-21-1926 Today's Date: 10/13/2021   History of Present Illness Eboni Coval is a 88 y.o. female patient with recent admission to the hospital for GI bleed not currently on any anticoagulation presents to the ER for evaluation of left-sided weakness slurred speech and facial droop that started around 730 this evening. PMH of HTN, HLD, CAD, RLS, Polycythemia Vera.   Clinical Impression   Pt seen for OT evaluation this date. Prior to hospital admission, pt was independent in all aspects of ADL and using rollator for most mobility. Spouse drives and pt able to manage light meal prep. Currently pt demonstrates impairments in L sided strength, coordination, and balance impairing her ability to perform mobility and ADL tasks at baseline independence. Pt endorses LUE strength and coordination deficits wax and wane and this was observed by therapist. No visual, cognitive, or sensory deficits appreciated. Pt instructed in [facility] activities and LUE strengthening. Pt demos understanding with intermittent VC for technique. Pt would benefit from additional skilled OT services to maximize recall and carryover of learned strategies as well as to maximize return to PLOF/independence, minimize falls risk, and minimize caregiver burden. Recommend HHOT services upon discharge.      Recommendations for follow up therapy are one component of a multi-disciplinary discharge planning process, led by the attending physician.  Recommendations may be updated based on patient status, additional functional criteria and insurance authorization.   Follow Up Recommendations  Home health OT    Assistance Recommended at Discharge Intermittent Supervision/Assistance  Patient can return home with the following A little help with bathing/dressing/bathroom;Assistance with cooking/housework;Assist for transportation;Help with stairs or ramp for  entrance    Functional Status Assessment  Patient has had a recent decline in their functional status and demonstrates the ability to make significant improvements in function in a reasonable and predictable amount of time.  Equipment Recommendations  None recommended by OT    Recommendations for Other Services       Precautions / Restrictions Precautions Precautions: Fall Restrictions Weight Bearing Restrictions: No      Mobility Bed Mobility               General bed mobility comments: NT, up in recliner    Transfers Overall transfer level: Needs assistance Equipment used: Rolling walker (2 wheels) Transfers: Sit to/from Stand Sit to Stand: Min guard                  Balance Overall balance assessment: Needs assistance Sitting-balance support: No upper extremity supported, Feet supported Sitting balance-Leahy Scale: Good     Standing balance support: Reliant on assistive device for balance, During functional activity Standing balance-Leahy Scale: Fair                             ADL either performed or assessed with clinical judgement   ADL                                         General ADL Comments: Pt currently requires PRN MIN A for LB/UB bathing and dressing, increasing assist when symptoms more severe     Vision Baseline Vision/History: 0 No visual deficits       Perception     Praxis      Pertinent Vitals/Pain Pain Assessment Pain Assessment:  No/denies pain     Hand Dominance Right   Extremity/Trunk Assessment Upper Extremity Assessment Upper Extremity Assessment: Generalized weakness;LUE deficits/detail LUE Deficits / Details: intermittent decr strength/coordination, improving mid-session. Pt reports symptoms have been waxing and waning LUE Sensation: WNL LUE Coordination: decreased fine motor;decreased gross motor   Lower Extremity Assessment Lower Extremity Assessment: LLE  deficits/detail;Generalized weakness RLE Sensation: WNL RLE Coordination: WNL LLE Deficits / Details: slightly weaker than RLE LLE Sensation: WNL LLE Coordination: WNL   Cervical / Trunk Assessment Cervical / Trunk Assessment: Normal   Communication Communication Communication: No difficulties   Cognition Arousal/Alertness: Awake/alert Behavior During Therapy: WFL for tasks assessed/performed Overall Cognitive Status: Within Functional Limits for tasks assessed                                       General Comments      Exercises Other Exercises Other Exercises: Pt instructed in [facility] and LUE strengthening exercises with pt able to return demo technique   Shoulder Instructions      Home Living Family/patient expects to be discharged to:: Private residence Living Arrangements: Spouse/significant other Available Help at Discharge: Family Type of Home: Apartment Home Access: Level entry     Home Layout: One level     Bathroom Shower/Tub: Occupational psychologist: Standard Bathroom Accessibility: Yes   Home Equipment: Rollator (4 wheels);Shower seat - built in      Lives With: Spouse    Prior Functioning/Environment Prior Level of Function : Independent/Modified Independent;Needs assist             Mobility Comments: use of rollator and sometimes no AD needed ADLs Comments: spouse there for shower transfers, provides transportation, pt indep with light meal prep and basic ADL        OT Problem List: Decreased strength;Decreased coordination;Impaired balance (sitting and/or standing);Decreased knowledge of use of DME or AE;Impaired UE functional use      OT Treatment/Interventions: Self-care/ADL training;Therapeutic exercise;Therapeutic activities;Neuromuscular education;DME and/or AE instruction;Patient/family education;Balance training    OT Goals(Current goals can be found in the care plan section) Acute Rehab OT Goals Patient  Stated Goal: go home and work hard to get better OT Goal Formulation: With patient Time For Goal Achievement: 10/27/21 Potential to Achieve Goals: Good ADL Goals Pt Will Perform Lower Body Dressing: with modified independence;sit to/from stand Pt Will Transfer to Toilet: with modified independence;ambulating (LRAD PRN) Pt/caregiver will Perform Home Exercise Program: Increased strength;Left upper extremity;With written HEP provided (strength, [place])  OT Frequency: Min 2X/week    Co-evaluation              AM-PAC OT "6 Clicks" Daily Activity     Outcome Measure Help from another person eating meals?: None Help from another person taking care of personal grooming?: None Help from another person toileting, which includes using toliet, bedpan, or urinal?: A Little Help from another person bathing (including washing, rinsing, drying)?: A Little Help from another person to put on and taking off regular upper body clothing?: A Little Help from another person to put on and taking off regular lower body clothing?: A Little 6 Click Score: 20   End of Session    Activity Tolerance: Patient tolerated treatment well Patient left: in chair;with call bell/phone within reach;with chair alarm set  OT Visit Diagnosis: Other abnormalities of gait and mobility (R26.89);Hemiplegia and hemiparesis Hemiplegia - Right/Left: Left Hemiplegia -  dominant/non-dominant: Non-Dominant Hemiplegia - caused by: Cerebral infarction                Time: 5284-1324 OT Time Calculation (min): 16 min Charges:  OT General Charges $OT Visit: 1 Visit OT Evaluation $OT Eval Moderate Complexity: 1 Mod OT Treatments $Neuromuscular Re-education: 8-22 mins  Ardeth Perfect., MPH, MS, OTR/L ascom [phone] 10/13/21, 1:37 PM

## 2021-10-13 NOTE — ED Provider Notes (Signed)
Discussed with Tele neuro Dr. Lyndle Herrlich, patient has a right vertebral artery occlusion.  He spoke with the neurologist over at [facility] and this would not be an LVO candidate therefore patient is able to stay here.  Therefore I will admit to the hospitalist doctor here   Vanessa [place], MD 10/13/21 [phone]

## 2021-10-13 NOTE — Procedures (Signed)
Routine EEG Report  Cassandra [name] is a 88 y.o. female with a history of stroke who is undergoing an EEG to evaluate for seizures.  Report: This EEG was acquired with electrodes placed according to the International 10-20 electrode system (including Fp1, Fp2, F3, F4, C3, C4, P3, P4, O1, O2, T3, T4, T5, T6, A1, A2, Fz, Cz, Pz). The following electrodes were missing or displaced: none.  The occipital dominant rhythm was 10 Hz. This activity is reactive to stimulation. Drowsiness was manifested by background fragmentation; deeper stages of sleep were identified by K complexes and sleep spindles. There was no focal slowing. There were no interictal epileptiform discharges. There were no electrographic seizures identified. There was no abnormal response to photic stimulation or hyperventilation.   Impression: This EEG was obtained while awake and asleep and is normal.    Clinical Correlation: Normal EEGs, however, do not rule out epilepsy.  Su Monks, MD [facility] [phone]  If 7pm- 7am, please page neurology on call as listed in [place].

## 2021-10-13 NOTE — H&P (Signed)
History and Physical    Sabryna Lahm DTO:[id number] DOB: 16-Oct-1926 DOA: 10/12/2021  PCP: Rusty Aus, MD   Patient coming from: home  I have personally briefly reviewed patient's relevant medical records in [place]  Chief Complaint: left sided weakness  HPI: Pauleen Goleman is a 88 y.o. female with medical history significant for HTN, DM, hypothyroidism, CKD 4, CAD, recently hospitalized from 1/8 to 10/05/2021 with hematochezia with stable hemoglobin, declining colonoscopy due to prior history of colon rupture during routine colonoscopy 10 years prior, discharged without aspirin, who presents to the ED with sudden onset left-sided weakness and garbled speech at [facility] on 1/18.  Symptoms resolved by arrival of EMS.  Since arrival to the ED however, symptoms have recurred and waxed and waned.  ED course: On arrival, BP 146/89 with otherwise normal vitals. Blood work significant for WBC 17,000, creatinine 2.44 which is her baseline.  EKG, personally viewed and interpreted: Sinus rhythm at 79 with nonspecific ST-T wave changes  Imaging: CT head with no acute findings MRI/MRA head and neck:( CTA not done due to renal function) Patchy small volume acute ischemic infarcts involving the right basal ganglia, right occipital lobe and right cerebellar hemisphere. Occlusion of right vertebral artery Severe distal right P2 P3 stenosis  Teleneurology consult: Telemetry neurologist discussed findings with neurologist at [facility] via [place] on 10/13/2021 at 00 45 and no intervention was recommended.  Hospitalist consulted for admission.   Review of Systems: As per HPI otherwise all other systems on review of systems negative.   Assessment/Plan    Acute CVA (cerebrovascular accident) [facility]) -Patient was out of tPA window by the completion of work-up and no LVO - Appreciate teleneurology recommendations - Aspirin 325 daily.  Continue rosuvastatin -Antihypertensives PRN if Blood  pressure is greater than 220/120 or there is a concern for End organ damage/contraindications for permissive HTN. If blood pressure is greater than 220/120 give labetalol PO or IV or Vasotec IV with a goal of 15% reduction in BP during the first 24 hours. - Stroke work-up: Continuous cardiac monitoring, echocardiogram - PT, OT and speech consults - Neurology consult to follow    Leukocytosis - Likely related to acute CVA - No stigmata of infection  History of self-limited hematochezia/GI bleed 10/02/2021  - Monitor H&H with reinitiation of aspirin    Essential hypertension - Hold antihypertensives for permissive hypertension    Diabetes mellitus, type II (HCC) - Sliding scale insulin coverage    Acquired hypothyroidism - Resume levothyroxine    CKD (chronic kidney disease) stage 4, GFR 15-29 ml/min (HCC) - Renal function at baseline    CAD (coronary artery disease), native coronary artery - No complaints of chest pain - Aspirin,, rosuvastatin.  Holding metoprolol for permissive hypertension  DVT prophylaxis: Lovenox  Code Status: full code  Family Communication:  none  Disposition Plan: Back to previous home environment Consults called: neurology  Status:At the time of admission, it appears that the appropriate admission status for this patient is INPATIENT. This is judged to be reasonable and necessary in order to provide the required intensity of service to ensure the patient's safety given the presenting symptoms, physical exam findings, and initial radiographic and laboratory data in the context of their  Comorbid conditions.   Patient requires inpatient status due to high intensity of service, high risk for further deterioration and high frequency of surveillance required.   I certify that at the point of admission it is my clinical judgment that the patient  will require inpatient hospital care spanning beyond 2 midnights     Physical Exam: Vitals:   10/12/21 2130  10/12/21 2145 10/12/21 2255 10/13/21 0000  BP: (!) 168/81 (!) 156/77 (!) 183/87 (!) 155/89  Pulse: 69 73  (!) 58  Resp: 16 20 16 15   Temp:      TempSrc:      SpO2: 95% 94% 95% 95%  Weight:      Height:       Constitutional: Alert, oriented x 3 . Not in any apparent distress HEENT:      Head: Normocephalic and atraumatic.         Eyes: PERLA, EOMI, Conjunctivae are normal. Sclera is non-icteric.       Mouth/Throat: Mucous membranes are moist.       Neck: Supple with no signs of meningismus. Cardiovascular: Regular rate and rhythm. No murmurs, gallops, or rubs. 2+ symmetrical distal pulses are present . No JVD. No  LE edema Respiratory: Respiratory effort normal .Lungs sounds clear bilaterally. No wheezes, crackles, or rhonchi.  Gastrointestinal: Soft, non tender, non distended. Positive bowel sounds.  Genitourinary: No CVA tenderness. Musculoskeletal: Nontender with normal range of motion in all extremities. No cyanosis, or erythema of extremities. Neurologic:  Face is symmetric. Moving all extremities. No gross focal neurologic deficits . Skin: Skin is warm, dry.  No rash or ulcers Psychiatric: Mood and affect are appropriate     Past Medical History:  Diagnosis Date   Arthritis    osteoarthritis in hands, knees ( R torn cartilage, L meniscus removed)    CAD (coronary artery disease)    Diabetes mellitus without complication (HCC)    Glaucoma    Hyperlipidemia    Hypertension    Hypothyroidism     Past Surgical History:  Procedure Laterality Date   ABDOMINAL HYSTERECTOMY     CORONARY ANGIOPLASTY WITH STENT PLACEMENT     FOOT SURGERY Bilateral    KNEE ARTHROSCOPY W/ MENISCECTOMY Left    Also has R knee injury (torn cartilage)      reports that she quit smoking about 41 years ago. She has never used smokeless tobacco. She reports current alcohol use. She reports that she does not use drugs.  Allergies  Allergen Reactions   Atorvastatin Other (See Comments)     MYALGIA   Cyclobenzaprine Other (See Comments)   Hydrocodone-Acetaminophen     Other reaction(s): Hallucination when taking whole pill, pt tolerates taking 1/2 pill   Mirtazapine     Other reaction(s): Hallucination   Oxycodone-Acetaminophen Other (See Comments)   Paroxetine Hcl Other (See Comments)   Penicillins Swelling    Lip and orbital swelling   Propoxyphene Other (See Comments)   Trazodone Other (See Comments)    Family History  Problem Relation Age of Onset   Hypertension Mother    Hypertension Father    Early death Father    Heart disease Father       Prior to Admission medications   Medication Sig Start Date End Date Taking? Authorizing Provider  albuterol (VENTOLIN HFA) 108 (90 Base) MCG/ACT inhaler Inhale into the lungs. 03/20/17  Yes [provider]  cetirizine (ZYRTEC) 10 MG tablet Take 1 tablet by mouth daily as needed for allergies.   Yes [provider]  cloNIDine (CATAPRES) 0.1 MG tablet Take 1 tablet (0.1 mg total) by mouth 2 (two) times daily. 10/05/21 11/04/21 Yes Zhang, Danford Bad, MD  DROXIA 300 MG capsule TAKE 1 CAPSULE (300 MG TOTAL) BY MOUTH DAILY.  MAY TAKE WITH FOOD TO MINIMIZE GI SIDE EFFECTS. 04/23/20  Yes Verlon Au, NP  latanoprost (XALATAN) 0.005 % ophthalmic solution Place 1 drop into both eyes at bedtime. 09/13/15  Yes [provider]  levothyroxine (SYNTHROID) 88 MCG tablet Take 88 mcg by mouth daily. Take on an empty stomach 30 to 60 minutes before breakfast 12/08/14  Yes [provider]  LOTREL 5-20 MG capsule Take 1 capsule by mouth daily. 07/03/17  Yes [provider]  metoprolol succinate (TOPROL-XL) 50 MG 24 hr tablet Take 1 tablet by mouth daily. 11/02/14  Yes [provider]  polyethylene glycol (MIRALAX / GLYCOLAX) 17 g packet Take 17 g by mouth daily. 10/06/21  Yes Sharen Hones, MD  rOPINIRole (REQUIP) 0.25 MG tablet Take 0.25 mg by mouth at bedtime. 04/22/21  Yes [provider]   rosuvastatin (CRESTOR) 20 MG tablet Take 20 mg by mouth daily. 01/07/15  Yes [provider]  timolol (TIMOPTIC-XR) 0.5 % ophthalmic gel-forming Place 1 drop into both eyes every morning. 05/06/21  Yes [provider]  azelastine (ASTELIN) 0.1 % nasal spray Place 2 sprays into both nostrils 2 (two) times daily. Patient not taking: Reported on 10/12/2021    [provider]  Cholecalciferol 25 MCG (1000 UT) tablet Take 1,000 Units by mouth daily. Patient not taking: Reported on 10/12/2021 04/26/17   [provider]  gabapentin (NEURONTIN) 300 MG capsule Take 1 capsule by mouth 2 (two) times daily. 01/05/15 10/03/21  [provider]  glucose blood test strip Use once daily. Use as instructed. 02/21/17   [provider]  naloxone [facility]) nasal spray 4 mg/0.1 mL Place 1 spray into the nose once as needed. Place 1 spray (4 mg total) into one nostril once as needed (if not breathing.) for up to 1 dose Patient not taking: Reported on 10/12/2021 12/15/20   [provider]      Labs on Admission: I have personally reviewed following labs and imaging studies  CBC: Recent Labs  Lab 10/12/21 2020  WBC 17.0*  NEUTROABS 13.4*  HGB 14.6  HCT 47.1*  MCV 93.3  PLT 275*   Basic Metabolic Panel: Recent Labs  Lab 10/12/21 2020  NA 137  K 4.7  CL 109  CO2 21*  GLUCOSE 132*  BUN 20  CREATININE 2.44*  CALCIUM 8.3*   GFR: Estimated Creatinine Clearance: 12.4 mL/min (A) (by C-G formula based on SCr of 2.44 mg/dL (H)). Liver Function Tests: Recent Labs  Lab 10/12/21 2020  AST 27  ALT 12  ALKPHOS 55  BILITOT 0.7  PROT 6.8  ALBUMIN 2.8*   No results for input(s): LIPASE, AMYLASE in the last 168 hours. No results for input(s): AMMONIA in the last 168 hours. Coagulation Profile: Recent Labs  Lab 10/12/21 2020  INR 1.0   Cardiac Enzymes: No results for input(s): CKTOTAL, CKMB, CKMBINDEX, TROPONINI in the last 168 hours. BNP (last 3  results) No results for input(s): PROBNP in the last 8760 hours. HbA1C: No results for input(s): HGBA1C in the last 72 hours. CBG: Recent Labs  Lab 10/12/21 2022  GLUCAP 138*   Lipid Profile: No results for input(s): CHOL, HDL, LDLCALC, TRIG, CHOLHDL, LDLDIRECT in the last 72 hours. Thyroid Function Tests: No results for input(s): TSH, T4TOTAL, FREET4, T3FREE, THYROIDAB in the last 72 hours. Anemia Panel: No results for input(s): VITAMINB12, FOLATE, FERRITIN, TIBC, IRON, RETICCTPCT in the last 72 hours. Urine analysis:    Component Value Date/Time   COLORURINE  RED (A) 10/02/2021 1742   APPEARANCEUR CLOUDY (A) 10/02/2021 1742   LABSPEC 1.017 10/02/2021 1742   PHURINE  10/02/2021 1742    TEST NOT REPORTED DUE TO COLOR INTERFERENCE OF URINE PIGMENT   GLUCOSEU (A) 10/02/2021 1742    TEST NOT REPORTED DUE TO COLOR INTERFERENCE OF URINE PIGMENT   HGBUR (A) 10/02/2021 1742    TEST NOT REPORTED DUE TO COLOR INTERFERENCE OF URINE PIGMENT   BILIRUBINUR (A) 10/02/2021 1742    TEST NOT REPORTED DUE TO COLOR INTERFERENCE OF URINE PIGMENT   KETONESUR (A) 10/02/2021 1742    TEST NOT REPORTED DUE TO COLOR INTERFERENCE OF URINE PIGMENT   PROTEINUR (A) 10/02/2021 1742    TEST NOT REPORTED DUE TO COLOR INTERFERENCE OF URINE PIGMENT   NITRITE (A) 10/02/2021 1742    TEST NOT REPORTED DUE TO COLOR INTERFERENCE OF URINE PIGMENT   LEUKOCYTESUR (A) 10/02/2021 1742    TEST NOT REPORTED DUE TO COLOR INTERFERENCE OF URINE PIGMENT    Radiological Exams on Admission: CT HEAD WO CONTRAST  Result Date: 10/12/2021 CLINICAL DATA:  TIA transient ischemic attack. EXAM: CT HEAD WITHOUT CONTRAST TECHNIQUE: Contiguous axial images were obtained from the base of the skull through the vertex without intravenous contrast. RADIATION DOSE REDUCTION: This exam was performed according to the departmental dose-optimization program which includes automated exposure control, adjustment of the mA and/or kV according to  patient size and/or use of iterative reconstruction technique. COMPARISON:  Head CT 07/14/2021 FINDINGS: Brain: No acute intracranial hemorrhage. No focal mass lesion. No CT evidence of acute infarction. No midline shift or mass effect. No hydrocephalus. Basilar cisterns are patent. There are periventricular and subcortical white matter hypodensities. Generalized cortical atrophy. Vascular: No hyperdense vessel or unexpected calcification. Skull: Normal. Negative for fracture or focal lesion. Sinuses/Orbits: Paranasal sinuses and mastoid air cells are clear. Orbits are clear. Other: None. IMPRESSION: 1. No acute intracranial findings. 2. Atrophy and white matter microvascular disease. Electronically Signed   By: Suzy Bouchard M.D.   On: 10/12/2021 21:02   MR ANGIO HEAD WO CONTRAST  Result Date: 10/13/2021 CLINICAL DATA:  Initial evaluation for acute stroke. EXAM: MRI HEAD WITHOUT CONTRAST MRA HEAD WITHOUT CONTRAST MRA NECK WITHOUT CONTRAST TECHNIQUE: Multiplanar, multiecho pulse sequences of the brain and surrounding structures were obtained without intravenous contrast. Angiographic images of the Circle of Willis were obtained using MRA technique without intravenous contrast. Angiographic images of the neck were obtained using MRA technique without intravenous contrast. Carotid stenosis measurements (when applicable) are obtained utilizing NASCET criteria, using the distal internal carotid diameter as the denominator. COMPARISON:  Prior head CT from earlier the same day. FINDINGS: MRI HEAD FINDINGS Brain: Generalized age-related cerebral atrophy. Patchy and confluent T2/FLAIR hyperintensity involving the periventricular deep white matter both cerebral hemispheres most consistent with chronic small vessel ischemic disease, mild for age. Patchy small volume foci of restricted diffusion seen involving the right basal ganglia, with involvement of the right caudate and lentiform nuclei (series 5, images 30, 29,  27). Tiny cortical infarct noted at the right occipital lobe posteriorly (series 5, image 28). Probable additional occipital infarct noted inferiorly as well (series 5, image 19). 8 mm acute ischemic right cerebellar infarct (series 5, image 14). No associated hemorrhage or mass effect. No other evidence for acute or subacute ischemia. Gray-white matter differentiation otherwise maintained. No other areas of chronic cortical infarction. No acute or chronic intracranial blood products. No mass lesion, midline shift or mass effect. No hydrocephalus or extra-axial fluid  collection. Pituitary gland suprasellar region normal. Midline structures intact. Vascular: Irregular flow void within the right vertebral artery, which could be related to slow flow and/or occlusion (series 10, image 3). Major intracranial vascular flow voids are otherwise maintained. Skull and upper cervical spine: Degenerative thickening noted about the tectorial membrane. Craniocervical junction otherwise unremarkable. Bone marrow signal intensity within normal limits. No scalp soft tissue abnormality. Sinuses/Orbits: Patient status post bilateral ocular lens replacement. Paranasal sinuses are largely clear. No significant mastoid effusion. Inner ear structures grossly normal. Other: None. MRA HEAD FINDINGS ANTERIOR CIRCULATION: Visualized distal cervical segments of the internal carotid arteries are patent with antegrade flow. Petrous segments patent bilaterally. Atheromatous irregularity throughout the carotid siphons without hemodynamically significant stenosis. 2 mm outpouching extending posteriorly from the supraclinoid left ICA favored to reflect a small vascular infundibulum related to a hypoplastic left posterior communicating artery. A1 segments patent bilaterally. Right A1 hypoplastic, accounting for the slightly diminutive right ICA is compared to the left. Normal anterior communicating artery complex. Anterior cerebral arteries patent  without stenosis. No M1 stenosis or occlusion. Normal MCA bifurcations. Distal MCA branches perfused and symmetric. POSTERIOR CIRCULATION: Left vertebral artery dominant and patent to the vertebrobasilar junction without stenosis. Left PICA patent. Distal right V3 segment attenuated but grossly patent at the upper neck. However, the right vertebral artery is largely occluded as it courses into the cranial vault as is the proximal right V4 segment. Attenuated flow within the distal right V4 segment suspected to be retrograde in nature across the vertebrobasilar junction. Right PICA not seen. Basilar patent to its distal aspect without stenosis. Superior cerebellar arteries patent bilaterally. Left PCA primarily supplied via the basilar. Right PCA supplied via the basilar as well as a prominent right posterior communicating artery. Left PCA patent to its distal aspect without stenosis. Severe distal right P2/P3 stenosis noted (series 1109, image 14). Right PCA somewhat attenuated distally but remains grossly patent. No intracranial aneurysm. MRA NECK FINDINGS AORTIC ARCH: Examination severely degraded by motion artifact, markedly limiting assessment. Aortic arch and origin of the great vessels not visualized or evaluated on this exam. RIGHT CAROTID SYSTEM: Right common and internal carotid arteries grossly patent with antegrade flow. No obvious stenosis about the right carotid bifurcation. There is question of a focal severe high-grade stenosis involving the proximal cervical right ICA (series 28, image 126). Finding not entirely certain and may simply be due to vascular tortuosity given the technical limitations of this exam. Partially visualized right ICA otherwise grossly patent distally. LEFT CAROTID SYSTEM: Visualized left CCA grossly patent to the bifurcation without stenosis. Probable atheromatous change about the left carotid bulb with no obvious high-grade stenosis. Partially visualized proximal left ICA  grossly patent distally. VERTEBRAL ARTERIES: Origins of the vertebral arteries not visualized on this exam. Left vertebral artery dominant and appears grossly patent within the neck without appreciable stenosis. Attenuated irregular and thready flow within the diminutive right vertebral artery, which appears to be partially occluded within the neck. Again, the right vertebral artery is seen to essentially occluded at the skull base on corresponding intracranial MRA. IMPRESSION: MRI HEAD IMPRESSION: 1. Patchy small volume acute ischemic infarcts involving the right basal ganglia, right occipital lobe, and right cerebellar hemisphere as above. No associated hemorrhage or mass effect. 2. Underlying age-related cerebral atrophy with mild chronic small vessel ischemic disease. MRA HEAD IMPRESSION: 1. Technically limited exam due to motion artifact. 2. 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 Attenuated flow within  the distal right V4 segment suspected to be retrograde in nature across the vertebrobasilar junction. 3. Severe distal right P2/P3 stenosis. 4. Otherwise grossly patent intracranial circulation. No other hemodynamically significant or correctable stenosis. MRA NECK IMPRESSION: 1. Severely limited and nearly nondiagnostic study due to extensive motion artifact. 2. Possible focal severe high-grade stenosis involving the proximal cervical right ICA. Finding not entirely certain and may simply be due to vascular tortuosity given the technical limitations of this exam. Correlation with carotid Doppler ultrasound and/or CTA could be performed for further evaluation as warranted. 3. No appreciable flow-limiting stenosis about the left carotid artery system on this limited exam. 4. Left vertebral artery dominant and grossly patent within the neck. Attenuated and irregular flow within the diminutive right vertebral artery. Again, the right vertebral artery appears to largely occluded  at the skull base are corresponding intracranial MRA. Electronically Signed   By: Jeannine Boga M.D.   On: 10/13/2021 00:07   MR ANGIO NECK WO CONTRAST  Result Date: 10/13/2021 CLINICAL DATA:  Initial evaluation for acute stroke. EXAM: MRI HEAD WITHOUT CONTRAST MRA HEAD WITHOUT CONTRAST MRA NECK WITHOUT CONTRAST TECHNIQUE: Multiplanar, multiecho pulse sequences of the brain and surrounding structures were obtained without intravenous contrast. Angiographic images of the Circle of Willis were obtained using MRA technique without intravenous contrast. Angiographic images of the neck were obtained using MRA technique without intravenous contrast. Carotid stenosis measurements (when applicable) are obtained utilizing NASCET criteria, using the distal internal carotid diameter as the denominator. COMPARISON:  Prior head CT from earlier the same day. FINDINGS: MRI HEAD FINDINGS Brain: Generalized age-related cerebral atrophy. Patchy and confluent T2/FLAIR hyperintensity involving the periventricular deep white matter both cerebral hemispheres most consistent with chronic small vessel ischemic disease, mild for age. Patchy small volume foci of restricted diffusion seen involving the right basal ganglia, with involvement of the right caudate and lentiform nuclei (series 5, images 30, 29, 27). Tiny cortical infarct noted at the right occipital lobe posteriorly (series 5, image 28). Probable additional occipital infarct noted inferiorly as well (series 5, image 19). 8 mm acute ischemic right cerebellar infarct (series 5, image 14). No associated hemorrhage or mass effect. No other evidence for acute or subacute ischemia. Gray-white matter differentiation otherwise maintained. No other areas of chronic cortical infarction. No acute or chronic intracranial blood products. No mass lesion, midline shift or mass effect. No hydrocephalus or extra-axial fluid collection. Pituitary gland suprasellar region normal. Midline  structures intact. Vascular: Irregular flow void within the right vertebral artery, which could be related to slow flow and/or occlusion (series 10, image 3). Major intracranial vascular flow voids are otherwise maintained. Skull and upper cervical spine: Degenerative thickening noted about the tectorial membrane. Craniocervical junction otherwise unremarkable. Bone marrow signal intensity within normal limits. No scalp soft tissue abnormality. Sinuses/Orbits: Patient status post bilateral ocular lens replacement. Paranasal sinuses are largely clear. No significant mastoid effusion. Inner ear structures grossly normal. Other: None. MRA HEAD FINDINGS ANTERIOR CIRCULATION: Visualized distal cervical segments of the internal carotid arteries are patent with antegrade flow. Petrous segments patent bilaterally. Atheromatous irregularity throughout the carotid siphons without hemodynamically significant stenosis. 2 mm outpouching extending posteriorly from the supraclinoid left ICA favored to reflect a small vascular infundibulum related to a hypoplastic left posterior communicating artery. A1 segments patent bilaterally. Right A1 hypoplastic, accounting for the slightly diminutive right ICA is compared to the left. Normal anterior communicating artery complex. Anterior cerebral arteries patent without stenosis. No M1 stenosis or occlusion. Normal MCA  bifurcations. Distal MCA branches perfused and symmetric. POSTERIOR CIRCULATION: Left vertebral artery dominant and patent to the vertebrobasilar junction without stenosis. Left PICA patent. Distal right V3 segment attenuated but grossly patent at the upper neck. However, the right vertebral artery is largely occluded as it courses into the cranial vault as is the proximal right V4 segment. Attenuated flow within the distal right V4 segment suspected to be retrograde in nature across the vertebrobasilar junction. Right PICA not seen. Basilar patent to its distal aspect  without stenosis. Superior cerebellar arteries patent bilaterally. Left PCA primarily supplied via the basilar. Right PCA supplied via the basilar as well as a prominent right posterior communicating artery. Left PCA patent to its distal aspect without stenosis. Severe distal right P2/P3 stenosis noted (series 1109, image 14). Right PCA somewhat attenuated distally but remains grossly patent. No intracranial aneurysm. MRA NECK FINDINGS AORTIC ARCH: Examination severely degraded by motion artifact, markedly limiting assessment. Aortic arch and origin of the great vessels not visualized or evaluated on this exam. RIGHT CAROTID SYSTEM: Right common and internal carotid arteries grossly patent with antegrade flow. No obvious stenosis about the right carotid bifurcation. There is question of a focal severe high-grade stenosis involving the proximal cervical right ICA (series 28, image 126). Finding not entirely certain and may simply be due to vascular tortuosity given the technical limitations of this exam. Partially visualized right ICA otherwise grossly patent distally. LEFT CAROTID SYSTEM: Visualized left CCA grossly patent to the bifurcation without stenosis. Probable atheromatous change about the left carotid bulb with no obvious high-grade stenosis. Partially visualized proximal left ICA grossly patent distally. VERTEBRAL ARTERIES: Origins of the vertebral arteries not visualized on this exam. Left vertebral artery dominant and appears grossly patent within the neck without appreciable stenosis. Attenuated irregular and thready flow within the diminutive right vertebral artery, which appears to be partially occluded within the neck. Again, the right vertebral artery is seen to essentially occluded at the skull base on corresponding intracranial MRA. IMPRESSION: MRI HEAD IMPRESSION: 1. Patchy small volume acute ischemic infarcts involving the right basal ganglia, right occipital lobe, and right cerebellar  hemisphere as above. No associated hemorrhage or mass effect. 2. Underlying age-related cerebral atrophy with mild chronic small vessel ischemic disease. MRA HEAD IMPRESSION: 1. Technically limited exam due to motion artifact. 2. 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 Attenuated flow within the distal right V4 segment suspected to be retrograde in nature across the vertebrobasilar junction. 3. Severe distal right P2/P3 stenosis. 4. Otherwise grossly patent intracranial circulation. No other hemodynamically significant or correctable stenosis. MRA NECK IMPRESSION: 1. Severely limited and nearly nondiagnostic study due to extensive motion artifact. 2. Possible focal severe high-grade stenosis involving the proximal cervical right ICA. Finding not entirely certain and may simply be due to vascular tortuosity given the technical limitations of this exam. Correlation with carotid Doppler ultrasound and/or CTA could be performed for further evaluation as warranted. 3. No appreciable flow-limiting stenosis about the left carotid artery system on this limited exam. 4. Left vertebral artery dominant and grossly patent within the neck. Attenuated and irregular flow within the diminutive right vertebral artery. Again, the right vertebral artery appears to largely occluded at the skull base are corresponding intracranial MRA. Electronically Signed   By: Jeannine Boga M.D.   On: 10/13/2021 00:07   MR BRAIN WO CONTRAST  Result Date: 10/13/2021 CLINICAL DATA:  Initial evaluation for acute stroke. EXAM: MRI HEAD WITHOUT CONTRAST MRA  HEAD WITHOUT CONTRAST MRA NECK WITHOUT CONTRAST TECHNIQUE: Multiplanar, multiecho pulse sequences of the brain and surrounding structures were obtained without intravenous contrast. Angiographic images of the Circle of Willis were obtained using MRA technique without intravenous contrast. Angiographic images of the neck were obtained using MRA technique  without intravenous contrast. Carotid stenosis measurements (when applicable) are obtained utilizing NASCET criteria, using the distal internal carotid diameter as the denominator. COMPARISON:  Prior head CT from earlier the same day. FINDINGS: MRI HEAD FINDINGS Brain: Generalized age-related cerebral atrophy. Patchy and confluent T2/FLAIR hyperintensity involving the periventricular deep white matter both cerebral hemispheres most consistent with chronic small vessel ischemic disease, mild for age. Patchy small volume foci of restricted diffusion seen involving the right basal ganglia, with involvement of the right caudate and lentiform nuclei (series 5, images 30, 29, 27). Tiny cortical infarct noted at the right occipital lobe posteriorly (series 5, image 28). Probable additional occipital infarct noted inferiorly as well (series 5, image 19). 8 mm acute ischemic right cerebellar infarct (series 5, image 14). No associated hemorrhage or mass effect. No other evidence for acute or subacute ischemia. Gray-white matter differentiation otherwise maintained. No other areas of chronic cortical infarction. No acute or chronic intracranial blood products. No mass lesion, midline shift or mass effect. No hydrocephalus or extra-axial fluid collection. Pituitary gland suprasellar region normal. Midline structures intact. Vascular: Irregular flow void within the right vertebral artery, which could be related to slow flow and/or occlusion (series 10, image 3). Major intracranial vascular flow voids are otherwise maintained. Skull and upper cervical spine: Degenerative thickening noted about the tectorial membrane. Craniocervical junction otherwise unremarkable. Bone marrow signal intensity within normal limits. No scalp soft tissue abnormality. Sinuses/Orbits: Patient status post bilateral ocular lens replacement. Paranasal sinuses are largely clear. No significant mastoid effusion. Inner ear structures grossly normal. Other:  None. MRA HEAD FINDINGS ANTERIOR CIRCULATION: Visualized distal cervical segments of the internal carotid arteries are patent with antegrade flow. Petrous segments patent bilaterally. Atheromatous irregularity throughout the carotid siphons without hemodynamically significant stenosis. 2 mm outpouching extending posteriorly from the supraclinoid left ICA favored to reflect a small vascular infundibulum related to a hypoplastic left posterior communicating artery. A1 segments patent bilaterally. Right A1 hypoplastic, accounting for the slightly diminutive right ICA is compared to the left. Normal anterior communicating artery complex. Anterior cerebral arteries patent without stenosis. No M1 stenosis or occlusion. Normal MCA bifurcations. Distal MCA branches perfused and symmetric. POSTERIOR CIRCULATION: Left vertebral artery dominant and patent to the vertebrobasilar junction without stenosis. Left PICA patent. Distal right V3 segment attenuated but grossly patent at the upper neck. However, the right vertebral artery is largely occluded as it courses into the cranial vault as is the proximal right V4 segment. Attenuated flow within the distal right V4 segment suspected to be retrograde in nature across the vertebrobasilar junction. Right PICA not seen. Basilar patent to its distal aspect without stenosis. Superior cerebellar arteries patent bilaterally. Left PCA primarily supplied via the basilar. Right PCA supplied via the basilar as well as a prominent right posterior communicating artery. Left PCA patent to its distal aspect without stenosis. Severe distal right P2/P3 stenosis noted (series 1109, image 14). Right PCA somewhat attenuated distally but remains grossly patent. No intracranial aneurysm. MRA NECK FINDINGS AORTIC ARCH: Examination severely degraded by motion artifact, markedly limiting assessment. Aortic arch and origin of the great vessels not visualized or evaluated on this exam. RIGHT CAROTID  SYSTEM: Right common and internal carotid arteries grossly patent  with antegrade flow. No obvious stenosis about the right carotid bifurcation. There is question of a focal severe high-grade stenosis involving the proximal cervical right ICA (series 28, image 126). Finding not entirely certain and may simply be due to vascular tortuosity given the technical limitations of this exam. Partially visualized right ICA otherwise grossly patent distally. LEFT CAROTID SYSTEM: Visualized left CCA grossly patent to the bifurcation without stenosis. Probable atheromatous change about the left carotid bulb with no obvious high-grade stenosis. Partially visualized proximal left ICA grossly patent distally. VERTEBRAL ARTERIES: Origins of the vertebral arteries not visualized on this exam. Left vertebral artery dominant and appears grossly patent within the neck without appreciable stenosis. Attenuated irregular and thready flow within the diminutive right vertebral artery, which appears to be partially occluded within the neck. Again, the right vertebral artery is seen to essentially occluded at the skull base on corresponding intracranial MRA. IMPRESSION: MRI HEAD IMPRESSION: 1. Patchy small volume acute ischemic infarcts involving the right basal ganglia, right occipital lobe, and right cerebellar hemisphere as above. No associated hemorrhage or mass effect. 2. Underlying age-related cerebral atrophy with mild chronic small vessel ischemic disease. MRA HEAD IMPRESSION: 1. Technically limited exam due to motion artifact. 2. 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 Attenuated flow within the distal right V4 segment suspected to be retrograde in nature across the vertebrobasilar junction. 3. Severe distal right P2/P3 stenosis. 4. Otherwise grossly patent intracranial circulation. No other hemodynamically significant or correctable stenosis. MRA NECK IMPRESSION: 1. Severely limited and nearly  nondiagnostic study due to extensive motion artifact. 2. Possible focal severe high-grade stenosis involving the proximal cervical right ICA. Finding not entirely certain and may simply be due to vascular tortuosity given the technical limitations of this exam. Correlation with carotid Doppler ultrasound and/or CTA could be performed for further evaluation as warranted. 3. No appreciable flow-limiting stenosis about the left carotid artery system on this limited exam. 4. Left vertebral artery dominant and grossly patent within the neck. Attenuated and irregular flow within the diminutive right vertebral artery. Again, the right vertebral artery appears to largely occluded at the skull base are corresponding intracranial MRA. Electronically Signed   By: Jeannine Boga M.D.   On: 10/13/2021 00:07       Athena Masse MD [facility]   10/13/2021, 1:47 AM

## 2021-10-13 NOTE — Evaluation (Signed)
Speech Language Pathology Evaluation Patient Details Name: Cassandra [name] MRN: [id number] DOB: 1926/10/02 Today's Date: 10/13/2021 Time: 1250-1315 SLP Time Calculation (min) (ACUTE ONLY): 25 min  Problem List:  Patient Active Problem List   Diagnosis Date Noted   History of GI bleed 10/13/2021   Acute CVA (cerebrovascular accident) ([place]) 10/13/2021   Leukocytosis 10/13/2021   Rectal bleeding 10/03/2021   Bright red rectal bleeding 10/02/2021   GIB (gastrointestinal bleeding) 10/02/2021   Hypoxemia 05/15/2021   Hypoxia 05/14/2021   Hypertensive urgency 05/14/2021   Tibial plateau fracture 04/21/21, left, closed, with routine healing, subsequent encounter 05/14/2021   CKD (chronic kidney disease) stage 4, GFR 15-29 ml/min ([place]) 04/07/2021   Acquired hypothyroidism 12/02/2020   Aortic atherosclerosis ([place]) 10/04/2020   Polycythemia vera ([place]) 06/02/2020   Reactive thrombocytosis 02/18/2020   Essential hypertension 01/24/2015   Diabetes mellitus, type II ([place]) 01/24/2015   Restless legs syndrome 01/24/2015   Chronic constipation 01/24/2015   CAD (coronary artery disease), native coronary artery 01/07/2014   Past Medical History:  Past Medical History:  Diagnosis Date   Arthritis    osteoarthritis in hands, knees ( R torn cartilage, L meniscus removed)    CAD (coronary artery disease)    Diabetes mellitus without complication (HCC)    Glaucoma    Hyperlipidemia    Hypertension    Hypothyroidism    Past Surgical History:  Past Surgical History:  Procedure Laterality Date   ABDOMINAL HYSTERECTOMY     CORONARY ANGIOPLASTY WITH STENT PLACEMENT     FOOT SURGERY Bilateral    KNEE ARTHROSCOPY W/ MENISCECTOMY Left    Also has R knee injury (torn cartilage)    HPI:  Per 49 H&P "Cassandra [name] is a 88 y.o. female with medical history significant for HTN, DM, hypothyroidism, CKD 4, CAD, recently hospitalized from 1/8 to 10/05/2021 with hematochezia with stable  hemoglobin, declining colonoscopy due to prior history of colon rupture during routine colonoscopy 10 years prior, discharged without aspirin, who presents to the ED with sudden onset left-sided weakness and garbled speech at [place] on 1/18.  Symptoms resolved by arrival of EMS.  Since arrival to the ED however, symptoms have recurred and waxed and waned.     ED course: On arrival, BP 146/89 with otherwise normal vitals.  Blood work significant for WBC 17,000, creatinine 2.44 which is her baseline.     EKG, personally viewed and interpreted: Sinus rhythm at 79 with nonspecific ST-T wave changes     Imaging: CT head with no acute findings  MRI/MRA head and neck:( CTA not done due to renal function)  Patchy small volume acute ischemic infarcts involving the right basal ganglia, right occipital lobe and right cerebellar hemisphere.  Occlusion of right vertebral artery Severe distal right P2 P3 stenosis     Teleneurology consult: Telemetry neurologist discussed findings with neurologist at [facility] via [place] on 10/13/2021 at 00 45 and no intervention was recommended.  Hospitalist consulted for admission."   Assessment / Plan / Recommendation Clinical Impression  Pt seen for speech/language/cognitive evaluation. Pt alert, pleasant, and cooperative. Seated upright in recliner upon SLP entrance to room. Cleared with RN.  Assessment completed via informal means. Pt demonstrated intact primary language ability. Speech is fluent, appropriate, and without s/sx dysarthria. Pt demonstated intact basic functional cognitive-linguistic ability including problem solving/reasoning, memory, and insight.   No further SLP services warranted at this time.  Pt made aware of role of SLP, results of assessment, and d/c recommendations. Pt verbalized  understanding/agreement.     SLP Assessment  SLP Recommendation/Assessment: Patient does not need any further [facility] SLP Visit Diagnosis: Dysarthria  and anarthria (R47.1)    Recommendations for follow up therapy are one component of a multi-disciplinary discharge planning process, led by the attending physician.  Recommendations may be updated based on patient status, additional functional criteria and insurance authorization.    Follow Up Recommendations  No SLP follow up    Assistance Recommended at Discharge   (defer to OT/PT)  Functional Status Assessment Patient has not had a recent decline in their functional status     SLP Evaluation Cognition  Overall Cognitive Status: Within Functional Limits for tasks assessed Arousal/Alertness: Awake/alert Orientation Level: Oriented X4 Memory: Appears intact Problem Solving: Appears intact Safety/Judgment: Appears intact       Comprehension  Auditory Comprehension Overall Auditory Comprehension: Appears within functional limits for tasks assessed Yes/No Questions: Within Functional Limits Commands: Within Functional Limits Conversation: Complex (WFL)    Expression Expression Primary Mode of Expression: Verbal Verbal Expression Overall Verbal Expression: Appears within functional limits for tasks assessed Initiation: No impairment Repetition: No impairment Naming: No impairment Written Expression Dominant Hand: Right Written Expression: Not tested   Oral / Motor  Oral Motor/Sensory Function Overall Oral Motor/Sensory Function: Within functional limits Motor Speech Overall Motor Speech: Appears within functional limits for tasks assessed Respiration: Within functional limits Resonance: Within functional limits Articulation: Within functional limitis Intelligibility: Intelligible Motor Planning: Witnin functional limits            Cherrie Gauze, M.S., [facility] [phone] ([place])   Quintella Baton 10/13/2021, 1:30 PM

## 2021-10-13 NOTE — Progress Notes (Signed)
Neurology progress note  Please see full teleneurology consult from overnight. This is a 88 yo woman with hx HTN, HL, CAD, RLS, polycythemia vera, admitted last week for GIB, CKD who presented with acute onset L facial droop and L sided weakness. TNK not administered 2/2 recent GIB. MRA during stroke code showed R vert occlusion which was discussed with [facility] neuro who recommended no intervention.   S: Patient is feeling well, states her L sided weakness almost completely resolved but then recurred this morning for a few minutes with worsening of her slurred speech, then resolved back to earlier AM baseline.  Stroke workup this admission:  MRI HEAD IMPRESSION:   1. Patchy small volume acute ischemic infarcts involving the right basal ganglia, right occipital lobe, and right cerebellar hemisphere as above. No associated hemorrhage or mass effect. 2. Underlying age-related cerebral atrophy with mild chronic small vessel ischemic disease.   MRA HEAD IMPRESSION:   1. Technically limited exam due to motion artifact. 2. 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 Attenuated flow within the distal right V4 segment suspected to be retrograde in nature across the vertebrobasilar junction. 3. Severe distal right P2/P3 stenosis. 4. Otherwise grossly patent intracranial circulation. No other hemodynamically significant or correctable stenosis.   MRA NECK IMPRESSION:   1. Severely limited and nearly nondiagnostic study due to extensive motion artifact. 2. Possible focal severe high-grade stenosis involving the proximal cervical right ICA. Finding not entirely certain and may simply be due to vascular tortuosity given the technical limitations of this exam. Correlation with carotid Doppler ultrasound and/or CTA could be performed for further evaluation as warranted. 3. No appreciable flow-limiting stenosis about the left carotid artery system on this limited  exam. 4. Left vertebral artery dominant and grossly patent within the neck. Attenuated and irregular flow within the diminutive right vertebral artery. Again, the right vertebral artery appears to largely occluded at the skull base are corresponding intracranial MRA.  CNS imaging personally reviewed.   TTE pending      Component Value Date/Time   CHOL 176 10/13/2021 0541   TRIG 83 10/13/2021 0541   HDL 44 10/13/2021 0541   CHOLHDL 4.0 10/13/2021 0541   VLDL 17 10/13/2021 0541   LDLCALC 115 (H) 10/13/2021 0541    Lab Results  Component Value Date/Time   HGBA1C 6.4 (H) 10/13/2021 05:41 AM   HGBA1C 6.4 (H) 10/13/2021 05:41 AM   O:  Vitals:   10/13/21 1137 10/13/21 1608  BP: (!) 179/96 (!) 173/87  Pulse: 78 75  Resp: 18 18  Temp: 97.7 F (36.5 C) 97.8 F (36.6 C)  SpO2: 96% 94%    Physical Exam Gen: A&Ox4, NAD HEENT: Atraumatic, normocephalic; oropharynx clear, tongue without atrophy or fasciculations. Resp: CTAB, normal work of breathing CV: RRR, extremities appear well-perfused. Abd: soft/NT/ND Extrem: Nml bulk; no cyanosis, clubbing, or edema.  Neuro: *MS: A&O x4. Follows multi-step commands.  *Speech: minimal dysarthria, no aphasia *CN:    I: Deferred   II,III: PERRLA, VFF by confrontation, optic discs not visualized 2/2 pupillary constriction   III,IV,VI: EOMI w/o nystagmus, no ptosis   V: Sensation intact from V1 to V3 to LT   VII: Eyelid closure was full.  L UMN facial droop   VIII: Hearing intact to voice   IX,X: Voice normal, palate elevates symmetrically    XI: SCM/trap 5/5 bilat   XII: Tongue protrudes midline, no atrophy or fasciculations  *Motor:   Normal bulk.  No tremor, rigidity or bradykinesia. No drift RUE, drift but not to bed LUE, no drift RLE, drift to bed LLE   Strength: Dlt Bic Tri WE WrF FgS Gr HF KnF KnE PlF DoF    Left 4+ 5 4 5 5 4 4 3 4  4+ 4 4    Right 5 5 5 5 5 5 5 5 5 5 5 5    *Sensory: SILT. No double-simultaneous extinction.   *Coordination:  FNF intact bilat *Reflexes:  1+ and symmetric throughout without clonus; toes down-going bilat *Gait: deferred  NIHSS 1a Level of Conscious.: 0 1b LOC Questions: 0 1c LOC Commands: 0 2 Best Gaze: 0 3 Visual: 0 4 Facial Palsy: 1 5a Motor Arm - left: 1 5b Motor Arm - Right: 0 6a Motor Leg - Left: 2 6b Motor Leg - Right: 0 7 Limb Ataxia: 0 8 Sensory: 0 9 Best Language: 0 10 Dysarthria: 1 11 Extinct. and Inatten.: 0  TOTAL: 5  Premorbid mRS = 0  A/P: This is a 88 yo woman with hx HTN, HL, CAD, RLS, polycythemia vera, admitted last week for GIB, CKD who presented with acute onset L facial droop and L sided weakness and found to have acute infarcts in R BG and cerebellum. Patient has known R vert occlusion and possibly high grade stenosis of R ICA. Mechanism of stroke may have been relative hypotension in setting of multiple cerebrovascular stenoses/occlusions, or potentially embolic.  - Carotid US to evaluate possible focal severe high-grade stenosis involving the proximal cervical right ICA - rEEG given fluctuating sx this AM - Permissive HTN x48 hrs from sx onset goal BP <220/110. PRN labetalol or hydralazine if BP above these parameters. Avoid oral antihypertensives. Strict avoidance of hypotension even after 48 hrs given carotid and vertebrobasilar insufficiency - Ideally patient would be on DAPT x90 days given her severe intracranial stenosis, however given her admission for GIB just last week feel ASA 81mg  daily is the most we can give her safely at this time.  - F.u TTE - Check A1c and LDL + add statin per guidelines - q4 hr neuro checks - STAT head CT for any change in neuro exam - Tele - PT/OT/SLP - Stroke education - Amb referral to neurology upon discharge  I will f/u outstanding studies in the AM  Su Monks, MD [facility] [phone]  If 7pm- 7am, please page neurology on call as listed in [place].

## 2021-10-13 NOTE — Progress Notes (Signed)
Eeg done 

## 2021-10-13 NOTE — Plan of Care (Signed)
Patient was seen and examined at bedside, patient was admitted due to slurred speech, left arm pain and weakness off and on since COVID booster and left lower extremity weakness off and on for 1 day.  Still her speech is back to normal but patient is more concerned about weakness off and on, seen by neurology, we will do EEG to rule out seizures, echocardiogram is pending.  Patient was started on aspirin 81 mg daily and continued Lipitor. Due to off-and-on weakness we will check B12, iron folate and vitamin D level. TSH elevated, follow free T4 and free T3 level, HbA1c 6.4, LDL 115

## 2021-10-13 NOTE — Progress Notes (Signed)
Anticoagulation monitoring(Lovenox):  88 yo female ordered Lovenox 40 mg Q24h    Filed Weights   10/12/21 2017  Weight: 65.3 kg (144 lb)   BMI 28.12    Lab Results  Component Value Date   CREATININE 2.44 (H) 10/12/2021   CREATININE 2.39 (H) 10/03/2021   CREATININE 2.53 (H) 10/02/2021   Estimated Creatinine Clearance: 12.4 mL/min (A) (by C-G formula based on SCr of 2.44 mg/dL (H)). Hemoglobin & Hematocrit     Component Value Date/Time   HGB 14.6 10/12/2021 2020   HGB 11.9 (L) 12/07/2012 1641   HCT 47.1 (H) 10/12/2021 2020   HCT 37.2 12/07/2012 1641     Per Protocol for Patient with estCrcl < 15 ml/min and BMI < 30, will transition to Heparin 5000 units SQ Q8H.

## 2021-10-14 ENCOUNTER — Inpatient Hospital Stay: Payer: Medicare Other

## 2021-10-14 ENCOUNTER — Inpatient Hospital Stay
Admit: 2021-10-14 | Discharge: 2021-10-14 | Disposition: A | Discharge status: Home / Self Care | Payer: Medicare Other | Attending: Internal Medicine | Admitting: Internal Medicine

## 2021-10-14 DIAGNOSIS — I639 Cerebral infarction, unspecified: Secondary | ICD-10-CM | POA: Diagnosis not present

## 2021-10-14 LAB — BASIC METABOLIC PANEL
Anion gap: 7 (ref 5–15)
BUN: 14 mg/dL (ref 8–23)
CO2: 22 mmol/L (ref 22–32)
Calcium: 8.5 mg/dL — ABNORMAL LOW (ref 8.9–10.3)
Chloride: 112 mmol/L — ABNORMAL HIGH (ref 98–111)
Creatinine, Ser: 2.39 mg/dL — ABNORMAL HIGH (ref 0.44–1.00)
GFR, Estimated: 19 mL/min — ABNORMAL LOW (ref >60)
Glucose, Bld: 113 mg/dL — ABNORMAL HIGH (ref 70–99)
Potassium: 3.9 mmol/L (ref 3.5–5.1)
Sodium: 141 mmol/L (ref 135–145)

## 2021-10-14 LAB — GLUCOSE, CAPILLARY
Glucose-Capillary: 102 mg/dL — ABNORMAL HIGH (ref 70–99)
Glucose-Capillary: 138 mg/dL — ABNORMAL HIGH (ref 70–99)
Glucose-Capillary: 95 mg/dL (ref 70–99)
Glucose-Capillary: 99 mg/dL (ref 70–99)

## 2021-10-14 LAB — CBC
HCT: 48.3 % — ABNORMAL HIGH (ref 36.0–46.0)
Hemoglobin: 14.9 g/dL (ref 12.0–15.0)
MCH: 28.3 pg (ref 26.0–34.0)
MCHC: 30.8 g/dL (ref 30.0–36.0)
MCV: 91.7 fL (ref 80.0–100.0)
Platelets: 639 10*3/uL — ABNORMAL HIGH (ref 150–400)
RBC: 5.27 MIL/uL — ABNORMAL HIGH (ref 3.87–5.11)
RDW: 18.2 % — ABNORMAL HIGH (ref 11.5–15.5)
WBC: 14.5 10*3/uL — ABNORMAL HIGH (ref 4.0–10.5)
nRBC: 0 % (ref 0.0–0.2)

## 2021-10-14 LAB — ECHOCARDIOGRAM COMPLETE
AR max vel: 2.2 cm2
AV Area VTI: 2.14 cm2
AV Area mean vel: 2.02 cm2
AV Mean grad: 4 mmHg
AV Peak grad: 5.9 mmHg
Ao pk vel: 1.21 m/s
Area-P 1/2: 4.54 cm2
Height: 60 in
MV VTI: 1.98 cm2
P 1/2 time: 522 msec
S' Lateral: 3 cm
Weight: 2304 oz

## 2021-10-14 LAB — PHOSPHORUS: Phosphorus: 3.1 mg/dL (ref 2.5–4.6)

## 2021-10-14 LAB — MAGNESIUM: Magnesium: 2 mg/dL (ref 1.7–2.4)

## 2021-10-14 MED ORDER — CLONIDINE HCL 0.1 MG PO TABS
0.1000 mg | ORAL_TABLET | Freq: Two times a day (BID) | ORAL | Status: DC
  Administered 2021-10-14 – 2021-10-15 (×3): 0.1 mg via ORAL
  Filled 2021-10-14 (×3): qty 1

## 2021-10-14 MED ORDER — GABAPENTIN 300 MG PO CAPS
300.0000 mg | ORAL_CAPSULE | Freq: Two times a day (BID) | ORAL | Status: DC
  Administered 2021-10-14 – 2021-10-15 (×3): 300 mg via ORAL
  Filled 2021-10-14 (×3): qty 1

## 2021-10-14 MED ORDER — METOPROLOL SUCCINATE ER 50 MG PO TB24
50.0000 mg | ORAL_TABLET | Freq: Every day | ORAL | Status: DC
  Administered 2021-10-14 – 2021-10-15 (×2): 50 mg via ORAL
  Filled 2021-10-14 (×2): qty 1

## 2021-10-14 MED ORDER — AZELASTINE HCL 0.1 % NA SOLN: 2.0000 | Freq: Two times a day (BID) | NASAL | Status: DC

## 2021-10-14 MED ORDER — CYANOCOBALAMIN 500 MCG PO TABS
500.0000 ug | ORAL_TABLET | Freq: Every day | ORAL | Status: DC
  Administered 2021-10-14 – 2021-10-15 (×2): 500 ug via ORAL
  Filled 2021-10-14 (×2): qty 1

## 2021-10-14 MED ORDER — DOXYCYCLINE HYCLATE 100 MG PO TABS
200.0000 mg | ORAL_TABLET | Freq: Once | ORAL | Status: AC
  Administered 2021-10-14: 11:00:00 200 mg via ORAL
  Filled 2021-10-14: qty 2

## 2021-10-14 MED ORDER — MELATONIN 5 MG PO TABS
5.0000 mg | ORAL_TABLET | Freq: Every day | ORAL | Status: DC
  Administered 2021-10-14: 23:00:00 5 mg via ORAL
  Filled 2021-10-14 (×3): qty 1

## 2021-10-14 MED ORDER — POLYETHYLENE GLYCOL 3350 17 G PO PACK
17.0000 g | PACK | Freq: Every day | ORAL | Status: DC
  Filled 2021-10-14 (×2): qty 1

## 2021-10-14 MED ORDER — HYDRALAZINE HCL 20 MG/ML IJ SOLN
10.0000 mg | Freq: Four times a day (QID) | INTRAMUSCULAR | Status: DC | PRN
  Administered 2021-10-14: 10 mg via INTRAVENOUS
  Filled 2021-10-14: qty 1

## 2021-10-14 MED ORDER — VITAMIN D (ERGOCALCIFEROL) 1.25 MG (50000 UNIT) PO CAPS
50000.0000 [IU] | ORAL_CAPSULE | ORAL | Status: DC
  Administered 2021-10-14: 50000 [IU] via ORAL
  Filled 2021-10-14: qty 1

## 2021-10-14 NOTE — Progress Notes (Signed)
*  PRELIMINARY RESULTS* Echocardiogram 2D Echocardiogram has been performed.  Sherrie Sport 10/14/2021, 7:52 AM

## 2021-10-14 NOTE — Progress Notes (Signed)
Patient requesting something to help her sleep tonight. Notified Sharion Settler NP, order placed for melatonin. Patient refused melatonin after explanation stating "I don't think I'm going to take that. I think it makes my head weird. I don't need that. My stomach hurts because I'm hungry. I should be able to sleep after eating something."  Boxed lunch provided to patient. Patient found to have purewick sitting outside of mesh underwear and bed pad wet. Pt denies removing it. New purewick, mesh underwear and bed pad placed.

## 2021-10-14 NOTE — Progress Notes (Signed)
Ch was On-Call when Pt was admitted. Pt continues to thrive, says she will go home tomorrow. Ch hopes so, AND was thrilled to see Pt again doing so well! Pt lights up life.

## 2021-10-14 NOTE — Progress Notes (Signed)
PT Cancellation Note  Patient Details Name: Cassandra [name] MRN: [id number] DOB: 1927-02-04   Cancelled Treatment:    Reason Eval/Treat Not Completed: Medical issues which prohibited therapy. Per EMR pt BP recorded at 192/86 mm Hg. Spoke with RN, plan to receive BP meds. PT to re attempt later on today if BP within safer range for mobility.   Salem Caster. Fairly IV, PT, DPT Physical Therapist- [facility]  10/14/2021, 12:35 PM

## 2021-10-14 NOTE — Progress Notes (Addendum)
Pt noted to have a small brownish tick to L upper breast, area very red with some noted skin coming off around area where tick attached, pt screaming out "get it off, get it off now", tick removed and placed in specimen cup, but did note a small part of tick mouth/head remaining in the pts skin could not removed despite several attempts, cleaned area with alcohol-MD made aware that portion remains and that we do not have any tools available to remove further-MD states that he will check and remove on rounds/assessment

## 2021-10-14 NOTE — Progress Notes (Signed)
[facility] Progress Note  Patient: Cassandra [name]    YBO:[id number]  DOA: 10/12/2021     Date of Service: the patient was seen and examined on 10/14/2021  Chief Complaint  Patient presents with   Transient Ischemic Attack   Brief hospital course: Cassandra [name] is a 88 y.o. female with medical history significant for HTN, DM, hypothyroidism, CKD 4, CAD, recently hospitalized from 1/8 to 10/05/2021 with hematochezia with stable hemoglobin, declining colonoscopy due to prior history of colon rupture during routine colonoscopy 10 years prior, discharged without aspirin, who presents to the ED with sudden onset left-sided weakness and garbled speech at [place] on 1/18.  Symptoms resolved by arrival of EMS.  Since arrival to the ED however, symptoms have recurred and waxed and waned.   ED course: On arrival, BP 146/89 with otherwise normal vitals. Blood work significant for WBC 17,000, creatinine 2.44 which is her baseline.   EKG, personally viewed and interpreted: Sinus rhythm at 79 with nonspecific ST-T wave changes   Imaging: CT head with no acute findings MRI/MRA head and neck:( CTA not done due to renal function) Patchy small volume acute ischemic infarcts involving the right basal ganglia, right occipital lobe and right cerebellar hemisphere. Occlusion of right vertebral artery Severe distal right P2 P3 stenosis   Teleneurology consult: Telemetry neurologist discussed findings with neurologist at [facility] via [place] on 10/13/2021 at 00 45 and no intervention was recommended.  Hospitalist consulted for admission.    Assessment and Plan: Acute CVA (cerebrovascular accident) [facility]) Patient was out of tPA window by the completion of work-up and no LVO S/p Aspirin 325 daily, started aspirin 81 mg daily, continue rosuvastatin -Antihypertensives PRN if Blood pressure is greater than 220/120 or there is a concern for End organ damage/contraindications for permissive HTN. If blood  pressure is greater than 220/120 give labetalol PO or IV or Vasotec IV with a goal of 15% reduction in BP during the first 24 hours. Carotid duplex, Elevated peak systolic velocity in the left internal carotid artery indicative of 50-69% stenosis.  Less than 50% stenosis of the right internal carotid artery.  - Stroke work-up: Continuous cardiac monitoring, echocardiogram - PT, OT and speech consults EEG negative for any seizures - Neurology consult to follow    Tick bite, tick was found attached about the left breast which was removed by RN but most part remained attached to the skin, continue skin care with Cosopt and iodine Tick was examined but it was squished, not engorged unable to identify as per ID, no treatment needed, watch for any fever Doxycycline 20 mg x 1 dose given for prophylaxis    Leukocytosis and thrombocytosis - Likely related to acute CVA - No stigmata of infection Monitor CBC and follow with hematology as an outpatient for further work-up  History of self-limited hematochezia/GI bleed 10/02/2021  - Monitor H&H with reinitiation of aspirin  Essential hypertension S/p permissive hypertension 1/20 resumed metoprolol 50 mg daily and clonidine point 1 mg p.o. twice daily Use IV hydralazine as needed     Diabetes mellitus, type II (HCC) - Sliding scale insulin coverage     Acquired hypothyroidism - Resume levothyroxine, TSH elevated, follow with PCP as an outpatient to repeat TSH level after 4 weeks     CKD (chronic kidney disease) stage 4, GFR 15-29 ml/min (HCC) - Renal function at baseline     CAD (coronary artery disease), native coronary artery - No complaints of chest pain - Aspirin,, rosuvastatin.  metoprolol  and clonidine   Body mass index is 28.12 kg/m.  Interventions:       Diet: Heart healthy/carb modified DVT Prophylaxis: Subcutaneous Lovenox   Advance goals of care discussion: Full code  Family Communication: family was NOT present at  bedside, at the time of interview.  The pt provided permission to discuss medical plan with the family. Opportunity was given to ask question and all questions were answered satisfactorily.   Disposition:  Pt is from Home, admitted with CVA, hypertension, found to have tach attached to left breast today still has high blood pressure, which precludes a safe discharge. Discharge to Home, when clinically stable, most likely in 1 to 2 days.  Subjective: No significant events overnight, in the morning time I take was found attached to her left breast which was removed by RN, patient does not have any new neurological symptoms, denies any weakness or numbness in the bilateral upper and lower extremities, no visual changes, no headache or dizziness, no fever.   Physical Exam: General:  alert oriented to time, place, and person.  Appear in no distress, affect anxious Eyes: PERRLA ENT: Oral Mucosa Clear, moist  Neck: no JVD,  Cardiovascular: S1 and S2 Present, no Murmur,  Respiratory: good respiratory effort, Bilateral Air entry equal and Decreased, no Crackles, no wheezes Abdomen: Bowel Sound present, Soft and no tenderness,  Skin: no rashes Extremities: no Pedal edema, no calf tenderness Neurologic: without any new focal findings Gait not checked due to patient safety concerns  Vitals:   10/13/21 2350 10/14/21 0432 10/14/21 0810 10/14/21 1133  BP: (!) 171/75 (!) 176/68 (!) 192/86 (!) 188/78  Pulse: 79 80 65 77  Resp: 20 20 16 16   Temp: 98.2 F (36.8 C) 98.2 F (36.8 C) 98.6 F (37 C) 98 F (36.7 C)  TempSrc: Oral Oral Oral Oral  SpO2: 95% 96% 99% 93%  Weight:      Height:        Intake/Output Summary (Last 24 hours) at 10/14/2021 1450 Last data filed at 10/14/2021 0600 Gross per 24 hour  Intake 962.3 ml  Output 1500 ml  Net -537.7 ml   Filed Weights   10/12/21 2017  Weight: 65.3 kg    Data Reviewed: I have personally reviewed and interpreted daily labs, tele strips,  imagings as discussed above. I reviewed all nursing notes, pharmacy notes, vitals, pertinent old records I have discussed plan of care as described above with RN and patient/family.  CBC: Recent Labs  Lab 10/12/21 2020 10/13/21 0541 10/14/21 0959  WBC 17.0* 15.2* 14.5*  NEUTROABS 13.4*  --   --   HGB 14.6 13.2 14.9  HCT 47.1* 43.4 48.3*  MCV 93.3 91.8 91.7  PLT 603* 557* 782*   Basic Metabolic Panel: Recent Labs  Lab 10/12/21 2020 10/13/21 0541 10/14/21 0959  NA 137  --  141  K 4.7  --  3.9  CL 109  --  112*  CO2 21*  --  22  GLUCOSE 132*  --  113*  BUN 20  --  14  CREATININE 2.44* 2.45* 2.39*  CALCIUM 8.3*  --  8.5*  MG  --   --  2.0  PHOS  --   --  3.1    Studies: US Carotid Bilateral  Result Date: 10/14/2021 CLINICAL DATA:  Stroke Hypertension CAD Hyperlipidemia Diabetes EXAM: BILATERAL CAROTID DUPLEX ULTRASOUND TECHNIQUE: Pearline Cables scale imaging, color Doppler and duplex ultrasound were performed of bilateral carotid and vertebral arteries in the neck.  COMPARISON:  None. FINDINGS: Criteria: Quantification of carotid stenosis is based on velocity parameters that correlate the residual internal carotid diameter with NASCET-based stenosis levels, using the diameter of the distal internal carotid lumen as the denominator for stenosis measurement. The following velocity measurements were obtained: RIGHT ICA: 80/19 cm/sec CCA: 49/67 cm/sec SYSTOLIC ICA/CCA RATIO:  1.2 ECA: 74 cm/sec LEFT ICA: 170/27 cm/sec CCA: 59/16 cm/sec SYSTOLIC ICA/CCA RATIO:  1.7 ECA: 76 cm/sec RIGHT CAROTID ARTERY: Mild calcified origin is plaque of the carotid bifurcation. RIGHT VERTEBRAL ARTERY:  Antegrade flow. LEFT CAROTID ARTERY: Mild heterogeneous plaque noted at the carotid bifurcation. LEFT VERTEBRAL ARTERY:  Antegrade flow. IMPRESSION: 1. Elevated peak systolic velocity in the left internal carotid artery indicative of 50-69% stenosis. 2. Less than 50% stenosis of the right internal carotid artery.  Electronically Signed   By: Miachel Roux M.D.   On: 10/14/2021 10:02   EEG adult  Result Date: 10/13/2021 Derek Jack, MD     10/13/2021  7:38 PM Routine EEG Report Cassandra [name] is a 88 y.o. female with a history of stroke who is undergoing an EEG to evaluate for seizures. Report: This EEG was acquired with electrodes placed according to the International 10-20 electrode system (including Fp1, Fp2, F3, F4, C3, C4, P3, P4, O1, O2, T3, T4, T5, T6, A1, A2, Fz, Cz, Pz). The following electrodes were missing or displaced: none. The occipital dominant rhythm was 10 Hz. This activity is reactive to stimulation. Drowsiness was manifested by background fragmentation; deeper stages of sleep were identified by K complexes and sleep spindles. There was no focal slowing. There were no interictal epileptiform discharges. There were no electrographic seizures identified. There was no abnormal response to photic stimulation or hyperventilation. Impression: This EEG was obtained while awake and asleep and is normal.   Clinical Correlation: Normal EEGs, however, do not rule out epilepsy. Su Monks, MD [facility] [phone] If 7pm- 7am, please page neurology on call as listed in [place].    Scheduled Meds:  aspirin EC  81 mg Oral Daily   cloNIDine  0.1 mg Oral BID   gabapentin  300 mg Oral BID   heparin  5,000 Units Subcutaneous Q8H   insulin aspart  0-15 Units Subcutaneous TID WC   insulin aspart  0-5 Units Subcutaneous QHS   latanoprost  1 drop Both Eyes QHS   levothyroxine  88 mcg Oral Q0600   melatonin  5 mg Oral QHS   metoprolol succinate  50 mg Oral Daily   pantoprazole  40 mg Oral Daily   polyethylene glycol  17 g Oral Daily   rOPINIRole  0.25 mg Oral QHS   rosuvastatin  20 mg Oral Daily   sodium bicarbonate  650 mg Oral BID   [START ON 10/15/2021] timolol  1 drop Both Eyes Daily   vitamin B-12  500 mcg Oral Daily   Vitamin D (Ergocalciferol)  50,000 Units Oral Q7 days    Continuous Infusions:  sodium chloride 50 mL/hr at 10/14/21 1107   PRN Meds: acetaminophen **OR** acetaminophen (TYLENOL) oral liquid 160 mg/5 mL **OR** acetaminophen, albuterol, hydrALAZINE  Time spent: 35 minutes  Author: Val Riles. MD [facility] 10/14/2021 2:50 PM  To reach On-call, see care teams to locate the attending and reach out to them via [email or web address]. If 7PM-7AM, please contact night-coverage If you still have difficulty reaching the attending provider, please page the [facility] (Director on Call) for [facility] on amion for assistance.

## 2021-10-14 NOTE — TOC Initial Note (Signed)
Transition of Care [facility]) - Initial/Assessment Note    Patient Details  Name: Cassandra [name] MRN: [id number] Date of Birth: 1927-04-19  Transition of Care [facility]) CM/SW Contact:    Candie Chroman, LCSW Phone Number: 10/14/2021, 4:45 PM  Clinical Narrative: CSW met with patient. No supports at bedside. CSW introduced role and explained that therapy recommendations would be discussed. Patient is agreeable to home health. No agency preference. Referral accepted by [facility] for PT, OT, RN, Education officer, museum. Patient and her husband previously received services from [facility] but did not like the food. Added home health social worker to see if there are any other meal resources. [facility] representative will call her in the room. Patient has a BSC and shower chair at home so she declined 3-in-1. Her husband will pick her up at discharge. No further concerns. CSW encouraged patient to contact CSW as needed. CSW will continue to follow patient for support and facilitate return home when stable.                 Expected Discharge Plan: [place] Barriers to Discharge: Continued Medical Work up   Patient Goals and CMS Choice   CMS Medicare.gov Compare Post Acute Care list provided to:: Patient    Expected Discharge Plan and Services Expected Discharge Plan: [place] Choice: [place] arrangements for the past 2 months: Apartment                           HH Arranged: RN, PT, OT, Social Work [employer] Agency: [place] Date [place]: 10/14/21   Representative spoke with at [place]: Bobbe Medico  Prior Living Arrangements/Services Living arrangements for the past 2 months: Apartment Lives with:: Spouse Patient language and need for interpreter reviewed:: Yes Do you feel safe going back to the place where you live?: Yes      Need for Family Participation in Patient Care: Yes (Comment) Care giver  support system in place?: Yes (comment) Current home services: DME Criminal Activity/Legal Involvement Pertinent to Current Situation/Hospitalization: No - Comment as needed  Activities of Daily Living Home Assistive Devices/Equipment: Gilford Rile (specify type) ADL Screening (condition at time of admission) Patient's cognitive ability adequate to safely complete daily activities?: Yes Is the patient deaf or have difficulty hearing?: No Does the patient have difficulty seeing, even when wearing glasses/contacts?: No Does the patient have difficulty concentrating, remembering, or making decisions?: No Patient able to express need for assistance with ADLs?: No Does the patient have difficulty dressing or bathing?: No Independently performs ADLs?: Yes (appropriate for developmental age) Does the patient have difficulty walking or climbing stairs?: No Weakness of Legs: None Weakness of Arms/Hands: None  Permission Sought/Granted Permission sought to share information with : Facility Art therapist granted to share information with : Yes, Verbal Permission Granted     Permission granted to share info w AGENCY: Home Health Agencies        Emotional Assessment Appearance:: Appears stated age Attitude/Demeanor/Rapport: Engaged, Gracious Affect (typically observed): Accepting, Appropriate, Calm, Pleasant Orientation: : Oriented to Self, Oriented to Place, Oriented to  Time, Oriented to Situation Alcohol / Substance Use: Not Applicable Psych Involvement: No (comment)  Admission diagnosis:  Acute CVA (cerebrovascular accident) ([place]) [I63.9] Cerebrovascular accident (CVA), unspecified mechanism ([place]) [I63.9] Patient Active Problem List   Diagnosis Date Noted   History of GI  bleed 10/13/2021   Acute CVA (cerebrovascular accident) ([place]) 10/13/2021   Leukocytosis 10/13/2021   Rectal bleeding 10/03/2021   Bright red rectal bleeding 10/02/2021   GIB (gastrointestinal bleeding)  10/02/2021   Hypoxemia 05/15/2021   Hypoxia 05/14/2021   Hypertensive urgency 05/14/2021   Tibial plateau fracture 04/21/21, left, closed, with routine healing, subsequent encounter 05/14/2021   CKD (chronic kidney disease) stage 4, GFR 15-29 ml/min ([place]) 04/07/2021   Acquired hypothyroidism 12/02/2020   Aortic atherosclerosis ([place]) 10/04/2020   Polycythemia vera ([place]) 06/02/2020   Reactive thrombocytosis 02/18/2020   Essential hypertension 01/24/2015   Diabetes mellitus, type II ([place]) 01/24/2015   Restless legs syndrome 01/24/2015   Chronic constipation 01/24/2015   CAD (coronary artery disease), native coronary artery 01/07/2014   PCP:  Rusty Aus, MD Pharmacy:   [facility] - [place], [place] [address] Phone: [phone] Fax: [phone]  [id number] [place] [address]: [phone]: [phone] [facility] [place] [address] [place]Phone: [phone]: [phone]    Social Determinants of Health (SDOH) Interventions    Readmission Risk Interventions No flowsheet data found.

## 2021-10-14 NOTE — Progress Notes (Signed)
Per Dr Mauricia Area, reviewed CDC guidelines regarding any remaining mouth parts, Per CDC guidelines-If you are unable to remove the mouth parts easily, leave them alone and let the skin heal. After removing the tick, thoroughly clean the bite area and your hands with rubbing alcohol, an iodine scrub, or soap and water, area was cleaned thoroughly with alcohol after tick was removed

## 2021-10-14 NOTE — Progress Notes (Signed)
Neurology brief note  Concern for focal severe high-grade stenosis on MRA appears to be artifactual, not corroborated by carotid US. No intervention needed. rEEG WNL.   TTE still pending. If negative for intracardiac clot or other significant abnl, patient may be discharged home on ASA 81mg  daily alone (no DAPT 2/2 recent GIB). Statin not prescribed 2/2 allergy. I will arrange outpatient neuro f/u. Neurology to sign off, please re-engage if additional neurologic concerns arise.  Su Monks, MD [facility] [phone]  If 7pm- 7am, please page neurology on call as listed in [place].

## 2021-10-14 NOTE — Progress Notes (Signed)
OT Cancellation Note  Patient Details Name: Cassandra [name] MRN: [id number] DOB: 02/20/1927   Cancelled Treatment:    Reason Eval/Treat Not Completed: Patient declined, no reason specified. Upon attempt, pt politely but adamantly declines OT tx. She states, "I'm doing working with therapy. I just want to go home and I'll do therapy there." Pt handed menu to order, BLE elevated per her request. Will re-attempt at later date/time as appropriate.   Ardeth Perfect., MPH, MS, OTR/L ascom [phone] 10/14/21, 1:38 PM

## 2021-10-15 DIAGNOSIS — I639 Cerebral infarction, unspecified: Secondary | ICD-10-CM | POA: Diagnosis not present

## 2021-10-15 LAB — CBC
HCT: 44.2 % (ref 36.0–46.0)
Hemoglobin: 13.4 g/dL (ref 12.0–15.0)
MCH: 27.7 pg (ref 26.0–34.0)
MCHC: 30.3 g/dL (ref 30.0–36.0)
MCV: 91.5 fL (ref 80.0–100.0)
Platelets: 632 10*3/uL — ABNORMAL HIGH (ref 150–400)
RBC: 4.83 MIL/uL (ref 3.87–5.11)
RDW: 18.3 % — ABNORMAL HIGH (ref 11.5–15.5)
WBC: 14.5 10*3/uL — ABNORMAL HIGH (ref 4.0–10.5)
nRBC: 0 % (ref 0.0–0.2)

## 2021-10-15 LAB — BASIC METABOLIC PANEL
Anion gap: 3 — ABNORMAL LOW (ref 5–15)
BUN: 14 mg/dL (ref 8–23)
CO2: 23 mmol/L (ref 22–32)
Calcium: 8.2 mg/dL — ABNORMAL LOW (ref 8.9–10.3)
Chloride: 113 mmol/L — ABNORMAL HIGH (ref 98–111)
Creatinine, Ser: 2.34 mg/dL — ABNORMAL HIGH (ref 0.44–1.00)
GFR, Estimated: 19 mL/min — ABNORMAL LOW (ref >60)
Glucose, Bld: 88 mg/dL (ref 70–99)
Potassium: 4.5 mmol/L (ref 3.5–5.1)
Sodium: 139 mmol/L (ref 135–145)

## 2021-10-15 LAB — GLUCOSE, CAPILLARY
Glucose-Capillary: 82 mg/dL (ref 70–99)
Glucose-Capillary: 89 mg/dL (ref 70–99)

## 2021-10-15 LAB — PHOSPHORUS: Phosphorus: 3.2 mg/dL (ref 2.5–4.6)

## 2021-10-15 LAB — MAGNESIUM: Magnesium: 1.8 mg/dL (ref 1.7–2.4)

## 2021-10-15 LAB — T3, FREE: T3, Free: 2.1 pg/mL (ref 2.0–4.4)

## 2021-10-15 MED ORDER — VITAMIN D (ERGOCALCIFEROL) 1.25 MG (50000 UNIT) PO CAPS: 50000.0000 [IU] | ORAL_CAPSULE | ORAL | 0 refills | Status: AC

## 2021-10-15 MED ORDER — PANTOPRAZOLE SODIUM 40 MG PO TBEC: 40.0000 mg | DELAYED_RELEASE_TABLET | Freq: Every day | ORAL | 2 refills | Status: DC

## 2021-10-15 MED ORDER — CYANOCOBALAMIN 500 MCG PO TABS: 500.0000 ug | ORAL_TABLET | Freq: Every day | ORAL | 0 refills | Status: AC

## 2021-10-15 MED ORDER — MELATONIN 5 MG PO TABS: 5.0000 mg | ORAL_TABLET | Freq: Every day | ORAL | 0 refills | Status: DC

## 2021-10-15 MED ORDER — ASPIRIN 81 MG PO TBEC: 81.0000 mg | DELAYED_RELEASE_TABLET | Freq: Every day | ORAL | 11 refills | Status: DC

## 2021-10-15 NOTE — Progress Notes (Signed)
Physical Therapy Treatment Patient Details Name: Cassandra [name] MRN: [id number] DOB: [birth year] Today's Date: 10/15/2021   History of Present Illness Cassandra [name] is a 88 y.o. female patient with recent admission to the hospital for GI bleed not currently on any anticoagulation presents to the ER for evaluation of left-sided weakness slurred speech and facial droop that started around 730 this evening. PMH of HTN, HLD, CAD, RLS, Polycythemia Vera.    PT Comments    Pt was long sitting in bed up[on arriving. She is A and O x 4 and eagerly awaiting MD about [place] home." You better let me go home today." Author introduced himself and explained that PT does not clear pt for DC. She required a little encouragement however eventually agrees to session and OOB activity. She was able to exit bed, stand to RW, and ambulate ~ 200 ft without LOB or safety concern. She was repositioned in recliner post session with call bell in reach and RN aware of pt's abilities. PT recommends DC home with HHPT to follow once cleared medically. She will continue to to benefit from skilled PT at DC to address deficits while assisting pt to PLOF.     Recommendations for follow up therapy are one component of a multi-disciplinary discharge planning process, led by the attending physician.  Recommendations may be updated based on patient status, additional functional criteria and insurance authorization.  Follow Up Recommendations  Home health PT     Assistance Recommended at Discharge Frequent or constant Supervision/Assistance  Patient can return home with the following A little help with walking and/or transfers;Assistance with cooking/housework;Assist for transportation;A little help with bathing/dressing/bathroom;Help with stairs or ramp for entrance   Equipment Recommendations  BSC/3in1       Precautions / Restrictions Precautions Precautions: Fall Restrictions Weight Bearing Restrictions: No      Mobility  Bed Mobility Overal bed mobility: Needs Assistance Bed Mobility: Supine to Sit     Supine to sit: HOB elevated, Supervision     General bed mobility comments: no physical assistance to exit bed. She was able to perform with supervision only.    Transfers Overall transfer level: Needs assistance Equipment used: Rolling walker (2 wheels) Transfers: Sit to/from Stand Sit to Stand: Supervision           General transfer comment: encouraged pt to use RW at alll times until she is able to return to PLOF. encouraged pt to use until HHPT tells her otherwise    Ambulation/Gait Ambulation/Gait assistance: Supervision Gait Distance (Feet): 200 Feet Assistive device: Rolling walker (2 wheels) Gait Pattern/deviations: Step-through pattern, Narrow base of support, Decreased step length - left, Decreased step length - right Gait velocity: decreased     General Gait Details: Pt was able to ambulate ~ 200 ft without LOB or safety concern. she was cued to increase BOS and once she did, noticable better gait kinematics overall.   Stairs    Pt has elevator at Independent living facility          Balance Overall balance assessment: Needs assistance Sitting-balance support: No upper extremity supported, Feet supported Sitting balance-Leahy Scale: Good     Standing balance support: Reliant on assistive device for balance, During functional activity Standing balance-Leahy Scale: Good Standing balance comment: good with UE support. high recommend use of RW at all times       Cognition Arousal/Alertness: Awake/alert Behavior During Therapy: WFL for tasks assessed/performed Overall Cognitive Status: Within Functional Limits for tasks assessed  General Comments: Pt is A and O x 4. agrees to PT session with a little encouragement.         Pertinent Vitals/Pain Pain Assessment Pain Assessment: No/denies pain     PT Goals (current goals can now be found in the  care plan section) Acute Rehab PT Goals Patient Stated Goal: to go home Progress towards PT goals: Progressing toward goals    Frequency    7X/week      PT Plan Current plan remains appropriate       AM-PAC PT "6 Clicks" Mobility   Outcome Measure  Help needed turning from your back to your side while in a flat bed without using bedrails?: A Little Help needed moving from lying on your back to sitting on the side of a flat bed without using bedrails?: A Little Help needed moving to and from a bed to a chair (including a wheelchair)?: A Little Help needed standing up from a chair using your arms (e.g., wheelchair or bedside chair)?: A Little Help needed to walk in hospital room?: A Little Help needed climbing 3-5 steps with a railing? : A Little 6 Click Score: 18    End of Session   Activity Tolerance: Patient tolerated treatment well Patient left: in chair;with call bell/phone within reach;with chair alarm set Nurse Communication: Mobility status PT Visit Diagnosis: Unsteadiness on feet (R26.81);Difficulty in walking, not elsewhere classified (R26.2);Muscle weakness (generalized) (M62.81)     Time: 1040-1104 PT Time Calculation (min) (ACUTE ONLY): 24 min  Charges:  $Gait Training: 8-22 mins $Therapeutic Activity: 8-22 mins                    Julaine Fusi PTA 10/15/21, 11:30 AM

## 2021-10-15 NOTE — Discharge Summary (Signed)
[facility] Discharge Summary   Patient: Cassandra [name] QKM:[id number]  PCP: Rusty Aus, MD  Date of admission: 10/12/2021   Date of discharge:  10/15/2021     Discharge Diagnoses:  Principal Problem:   Acute CVA (cerebrovascular accident) [facility]) Active Problems:   Essential hypertension   Diabetes mellitus, type II ([place])   Acquired hypothyroidism   CKD (chronic kidney disease) stage 4, GFR 15-29 ml/min (HCC)   CAD (coronary artery disease), native coronary artery   History of GI bleed   Leukocytosis   Admitted From: Home Disposition:  Home with HHPT/OT  Recommendations for Outpatient Follow-up:  PCP: in 1 wk Neurology in 2 to 4 weeks Follow up LABS/TEST: CBC after 1 week, BMP after 1 week, TSH after 4 weeks   Follow-up Information     [facility] Follow up.   Why: They will follow up with you for your home health needs: Physical therapy, occupational therapy, nurse, and social worker.               Diet recommendation: Cardiac and Carb modified diet  Activity: The patient is advised to gradually reintroduce usual activities, as tolerated  Discharge Condition: stable  Code Status: Full code   History of present illness: As per the H and P dictated on admission Hospital Course:  Cassandra [name] is a 88 y.o. female with medical history significant for HTN, DM, hypothyroidism, CKD 4, CAD, recently hospitalized from 1/8 to 10/05/2021 with hematochezia with stable hemoglobin, declining colonoscopy due to prior history of colon rupture during routine colonoscopy 10 years prior, discharged without aspirin, who presents to the ED with sudden onset left-sided weakness and garbled speech at [place] on 1/18.  Symptoms resolved by arrival of EMS.  Since arrival to the ED however, symptoms have recurred and waxed and waned. ED course: On arrival, BP 146/89 with otherwise normal vitals. Blood work significant for WBC 17,000, creatinine 2.44 which is her  baseline. EKG, personally viewed and interpreted: Sinus rhythm at 79 with nonspecific ST-T wave changes  Imaging: CT head with no acute findings MRI/MRA head and neck:( CTA not done due to renal function) Patchy small volume acute ischemic infarcts involving the right basal ganglia, right occipital lobe and right cerebellar hemisphere. Occlusion of right vertebral artery Severe distal right P2 P3 stenosis Teleneurology consult: Telemetry neurologist discussed findings with neurologist at [facility] via [place] on 10/13/2021 at 00 45 and no intervention was recommended.  Hospitalist consulted for admission.   Assessment and Plan: Acute CVA (cerebrovascular accident), Patient was out of tPA window by the completion of work-up and no LVO, S/p Aspirin 325 daily, started aspirin 81 mg daily, continue rosuvastatin Antihypertensives PRN if Blood pressure is greater than 220/120 or there is a concern for End organ damage/contraindications for permissive HTN. If blood pressure is greater than 220/120 give labetalol PO or IV or Vasotec IV with a goal of 15% reduction in BP during the first 24 hours. Carotid duplex, Elevated peak systolic velocity in the left internal carotid artery indicative of 50-69% stenosis.  Less than 50% stenosis of the right internal carotid artery. S/p telemetry monitoring, no significant event noticed.  TTE shows LVEF 60-65%, no LV wall motion abnormality.  Seen by neurology, EEG negative for any seizures.  SLP eval passed, PT and OT eval done no any needs.  Patient was cleared by neurology to discharge and follow-up as an outpatient.  Tick bite, tick was found attached about the left breast which was  removed by RN but most part remained attached to the skin, continue skin care with Cosopt and iodine Tick was examined but it was squished, not engorged unable to identify as per ID, no treatment needed, watch for any fever. Doxycycline 20 mg x 1 dose given for prophylaxis Leukocytosis  and thrombocytosis, Likely related to acute CVA, No stigmata of infection Follow with PCP and repeat CBC after 1 week, if persistent leukocytosis and thrombocytosis, then follow with hematology as an outpatient for further work-up History of self-limited hematochezia/GI bleed 10/02/2021,  H&H remained stable, after reinitiation of aspirin.  Patient was advised to watch for any signs of GI bleeding. Essential hypertension, S/p permissive hypertension, On 1/20 resumed metoprolol 50 mg daily and clonidine point 1 mg p.o. twice daily.  Monitor BP at home and follow with PCP. Diabetes mellitus, type II s/p sliding scale insulin coverage during hospital stay, patient is not on any medication.  Advised to continue diabetic diet and follow with PCP. Acquired hypothyroidism, Resume levothyroxine, TSH elevated, follow with PCP as an outpatient to repeat TSH level after 4 weeks CKD (chronic kidney disease) stage 4, GFR 15-29 ml/min, Renal function at baseline CAD (coronary artery disease), native coronary artery, No complaints of chest pain, continued Aspirin,, rosuvastatin.  metoprolol and clonidine  Body mass index is 28.12 kg/m.  Nutrition Interventions:  Patient was seen by physical therapy, who recommended Home health, which was arranged. On the day of the discharge the patient's vitals were stable, and no other acute medical condition were reported by patient. the patient was felt safe to be discharge at Home with Home health.  Consultants: Neurology ID for tick bite Procedures: EEG  Discharge Exam: General: Appear in no distress, No  Rash; Oral Mucosa Clear, moist. Cardiovascular: S1 and S2 Present, no Murmur, Respiratory: normal respiratory effort, Bilateral Air entry present and no Crackles, no wheezes Abdomen: Bowel Sound present, Soft and no tenderness, no hernia Extremities: no Pedal edema, no calf tenderness Neurology: alert and oriented to time, place, and person affect  appropriate.  Filed Weights   10/12/21 2017  Weight: 65.3 kg   Vitals:   10/15/21 0749 10/15/21 1150  BP: (!) 144/60 (!) 158/82  Pulse: 63 (!) 58  Resp: 16 16  Temp: 97.8 F (36.6 C) 98.3 F (36.8 C)  SpO2: 95% 97%    DISCHARGE MEDICATION: Allergies as of 10/15/2021       Reactions   Atorvastatin Other (See Comments)   MYALGIA   Cyclobenzaprine Other (See Comments)   Hydrocodone-acetaminophen    Other reaction(s): Hallucination when taking whole pill, pt tolerates taking 1/2 pill   Mirtazapine    Other reaction(s): Hallucination   Oxycodone-acetaminophen Other (See Comments)   Paroxetine Hcl Other (See Comments)   Penicillins Swelling   Lip and orbital swelling   Propoxyphene Other (See Comments)   Trazodone Other (See Comments)        Medication List     TAKE these medications    albuterol 108 (90 Base) MCG/ACT inhaler Commonly known as: VENTOLIN HFA Inhale into the lungs.   aspirin 81 MG EC tablet Take 1 tablet (81 mg total) by mouth daily. Swallow whole. Start taking on: October 16, 2021   azelastine 0.1 % nasal spray Commonly known as: ASTELIN Place 2 sprays into both nostrils 2 (two) times daily.   cetirizine 10 MG tablet Commonly known as: ZYRTEC Take 1 tablet by mouth daily as needed for allergies.   Cholecalciferol 25 MCG (1000 UT) tablet  Take 1,000 Units by mouth daily.   cloNIDine 0.1 MG tablet Commonly known as: Catapres Take 1 tablet (0.1 mg total) by mouth 2 (two) times daily.   Droxia 300 MG capsule Generic drug: hydroxyurea TAKE 1 CAPSULE (300 MG TOTAL) BY MOUTH DAILY. MAY TAKE WITH FOOD TO MINIMIZE GI SIDE EFFECTS.   gabapentin 300 MG capsule Commonly known as: NEURONTIN Take 1 capsule by mouth 2 (two) times daily.   glucose blood test strip Use once daily. Use as instructed.   latanoprost 0.005 % ophthalmic solution Commonly known as: XALATAN Place 1 drop into both eyes at bedtime.   levothyroxine 88 MCG tablet Commonly  known as: SYNTHROID Take 88 mcg by mouth daily. Take on an empty stomach 30 to 60 minutes before breakfast   Lotrel 5-20 MG capsule Generic drug: amLODipine-benazepril Take 1 capsule by mouth daily.   melatonin 5 MG Tabs Take 1 tablet (5 mg total) by mouth at bedtime.   metoprolol succinate 50 MG 24 hr tablet Commonly known as: TOPROL-XL Take 1 tablet by mouth daily.   naloxone 4 MG/0.1ML Liqd nasal spray kit Commonly known as: NARCAN Place 1 spray into the nose once as needed. Place 1 spray (4 mg total) into one nostril once as needed (if not breathing.) for up to 1 dose   pantoprazole 40 MG tablet Commonly known as: PROTONIX Take 1 tablet (40 mg total) by mouth daily. Start taking on: October 16, 2021   polyethylene glycol 17 g packet Commonly known as: MIRALAX / GLYCOLAX Take 17 g by mouth daily.   rOPINIRole 0.25 MG tablet Commonly known as: REQUIP Take 0.25 mg by mouth at bedtime.   rosuvastatin 20 MG tablet Commonly known as: CRESTOR Take 20 mg by mouth daily.   timolol 0.5 % ophthalmic gel-forming Commonly known as: TIMOPTIC-XR Place 1 drop into both eyes every morning.   vitamin B-12 500 MCG tablet Commonly known as: CYANOCOBALAMIN Take 1 tablet (500 mcg total) by mouth daily. Start taking on: October 16, 2021   Vitamin D (Ergocalciferol) 1.25 MG (50000 UNIT) Caps capsule Commonly known as: DRISDOL Take 1 capsule (50,000 Units total) by mouth every 7 (seven) days. Start taking on: October 21, 2021       Allergies  Allergen Reactions   Atorvastatin Other (See Comments)    MYALGIA   Cyclobenzaprine Other (See Comments)   Hydrocodone-Acetaminophen     Other reaction(s): Hallucination when taking whole pill, pt tolerates taking 1/2 pill   Mirtazapine     Other reaction(s): Hallucination   Oxycodone-Acetaminophen Other (See Comments)   Paroxetine Hcl Other (See Comments)   Penicillins Swelling    Lip and orbital swelling   Propoxyphene Other (See  Comments)   Trazodone Other (See Comments)   Discharge Instructions     Ambulatory referral to Neurology   Complete by: As directed    An appointment is requested in approximately: 6 wks   Call MD for:  difficulty breathing, headache or visual disturbances   Complete by: As directed    Call MD for:  extreme fatigue   Complete by: As directed    Call MD for:  persistant dizziness or light-headedness   Complete by: As directed    Call MD for:  persistant nausea and vomiting   Complete by: As directed    Call MD for:  severe uncontrolled pain   Complete by: As directed    Call MD for:  temperature >100.4   Complete by: As directed  Diet - low sodium heart healthy   Complete by: As directed    Discharge instructions   Complete by: As directed    Follow-up with PCP in 1 week, continue monitor BP and titrate medication accordingly.  Repeat vitamin B12 and vitamin D level after 3 months. Follow with neurology in 2 to 4 weeks as an outpatient.   Increase activity slowly   Complete by: As directed        The results of significant diagnostics from this hospitalization (including imaging, microbiology, ancillary and laboratory) are listed below for reference.    Significant Diagnostic Studies: CT ABDOMEN PELVIS WO CONTRAST  Result Date: 10/02/2021 CLINICAL DATA:  Lower GI bleed. EXAM: CT ABDOMEN AND PELVIS WITHOUT CONTRAST TECHNIQUE: Multidetector CT imaging of the abdomen and pelvis was performed following the standard protocol without IV contrast. COMPARISON:  None. FINDINGS: Lower chest: No acute airspace disease or pleural effusion. Normal heart size with coronary artery calcifications. There is retained debris/ingested material within the distal esophagus, above the gastroesophageal junction, series 2, image 6. 5 mm nodule in the right lower lobe, series 3, image 10. This nodule is unchanged in size from 09/14/2015 CT and considered benign. No further imaging follow-up or  characterization is recommended. Hepatobiliary: No focal hepatic abnormality on this unenhanced exam. Gallstones within partially distended gallbladder. No pericholecystic fat stranding or inflammation. Calcifications at the porta hepatis are vascular rather than local physis. The common bile duct is nondilated. Pancreas: No ductal dilatation or inflammation. Spleen: Normal in size without focal abnormality. Adrenals/Urinary Tract: Normal adrenal glands. No hydronephrosis. No renal calculi. 2.1 cm water density lesion in the lateral left kidney is consistent with cyst. The urinary bladder is partially distended. Stomach/Bowel: Retained debris/ingested material within the distal esophagus, above the gastroesophageal junction. Stomach is physiologically distended without gastric wall thickening. No small bowel obstruction or inflammatory change. The appendix is not confidently visualized on the current exam. No appendicitis. Colonic diverticulosis involving the cecum and ascending colon as well as descending and sigmoid colon. No diverticulitis. No evidence of colonic wall thickening or colonic inflammation. No pericolonic edema. Enteric sutures at the rectosigmoid junction. Vascular/Lymphatic: Advanced aortic atherosclerosis. No aortic aneurysm. Moderate branch atherosclerosis. No portal venous or mesenteric gas. Scattered calcified lymph nodes in the upper abdomen. No enlarged lymph nodes in the abdomen or pelvis by size criteria. Reproductive: Status post hysterectomy. No adnexal masses. Other: No free air, free fluid, or intra-abdominal fluid collection. Postsurgical change of the anterior abdominal wall. Musculoskeletal: Diffuse degenerative disc disease in the lumbar spine. Bony under mineralization. No acute osseous findings. IMPRESSION: 1. No explanation for GI bleed. Colonic diverticulosis without acute diverticulitis or evidence of colonic inflammation. 2. Retained debris/ingested material within the distal  esophagus, above the gastroesophageal junction, may be reflux or delayed transit. 3. Cholelithiasis without CT findings of acute cholecystitis. Aortic Atherosclerosis (ICD10-I70.0). Electronically Signed   By: Keith Rake M.D.   On: 10/02/2021 18:17   CT HEAD WO CONTRAST  Result Date: 10/12/2021 CLINICAL DATA:  TIA transient ischemic attack. EXAM: CT HEAD WITHOUT CONTRAST TECHNIQUE: Contiguous axial images were obtained from the base of the skull through the vertex without intravenous contrast. RADIATION DOSE REDUCTION: This exam was performed according to the departmental dose-optimization program which includes automated exposure control, adjustment of the mA and/or kV according to patient size and/or use of iterative reconstruction technique. COMPARISON:  Head CT 07/14/2021 FINDINGS: Brain: No acute intracranial hemorrhage. No focal mass lesion. No CT evidence of acute infarction.  No midline shift or mass effect. No hydrocephalus. Basilar cisterns are patent. There are periventricular and subcortical white matter hypodensities. Generalized cortical atrophy. Vascular: No hyperdense vessel or unexpected calcification. Skull: Normal. Negative for fracture or focal lesion. Sinuses/Orbits: Paranasal sinuses and mastoid air cells are clear. Orbits are clear. Other: None. IMPRESSION: 1. No acute intracranial findings. 2. Atrophy and white matter microvascular disease. Electronically Signed   By: Suzy Bouchard M.D.   On: 10/12/2021 21:02   MR ANGIO HEAD WO CONTRAST  Result Date: 10/13/2021 CLINICAL DATA:  Initial evaluation for acute stroke. EXAM: MRI HEAD WITHOUT CONTRAST MRA HEAD WITHOUT CONTRAST MRA NECK WITHOUT CONTRAST TECHNIQUE: Multiplanar, multiecho pulse sequences of the brain and surrounding structures were obtained without intravenous contrast. Angiographic images of the Circle of Willis were obtained using MRA technique without intravenous contrast. Angiographic images of the neck were  obtained using MRA technique without intravenous contrast. Carotid stenosis measurements (when applicable) are obtained utilizing NASCET criteria, using the distal internal carotid diameter as the denominator. COMPARISON:  Prior head CT from earlier the same day. FINDINGS: MRI HEAD FINDINGS Brain: Generalized age-related cerebral atrophy. Patchy and confluent T2/FLAIR hyperintensity involving the periventricular deep white matter both cerebral hemispheres most consistent with chronic small vessel ischemic disease, mild for age. Patchy small volume foci of restricted diffusion seen involving the right basal ganglia, with involvement of the right caudate and lentiform nuclei (series 5, images 30, 29, 27). Tiny cortical infarct noted at the right occipital lobe posteriorly (series 5, image 28). Probable additional occipital infarct noted inferiorly as well (series 5, image 19). 8 mm acute ischemic right cerebellar infarct (series 5, image 14). No associated hemorrhage or mass effect. No other evidence for acute or subacute ischemia. Gray-white matter differentiation otherwise maintained. No other areas of chronic cortical infarction. No acute or chronic intracranial blood products. No mass lesion, midline shift or mass effect. No hydrocephalus or extra-axial fluid collection. Pituitary gland suprasellar region normal. Midline structures intact. Vascular: Irregular flow void within the right vertebral artery, which could be related to slow flow and/or occlusion (series 10, image 3). Major intracranial vascular flow voids are otherwise maintained. Skull and upper cervical spine: Degenerative thickening noted about the tectorial membrane. Craniocervical junction otherwise unremarkable. Bone marrow signal intensity within normal limits. No scalp soft tissue abnormality. Sinuses/Orbits: Patient status post bilateral ocular lens replacement. Paranasal sinuses are largely clear. No significant mastoid effusion. Inner ear  structures grossly normal. Other: None. MRA HEAD FINDINGS ANTERIOR CIRCULATION: Visualized distal cervical segments of the internal carotid arteries are patent with antegrade flow. Petrous segments patent bilaterally. Atheromatous irregularity throughout the carotid siphons without hemodynamically significant stenosis. 2 mm outpouching extending posteriorly from the supraclinoid left ICA favored to reflect a small vascular infundibulum related to a hypoplastic left posterior communicating artery. A1 segments patent bilaterally. Right A1 hypoplastic, accounting for the slightly diminutive right ICA is compared to the left. Normal anterior communicating artery complex. Anterior cerebral arteries patent without stenosis. No M1 stenosis or occlusion. Normal MCA bifurcations. Distal MCA branches perfused and symmetric. POSTERIOR CIRCULATION: Left vertebral artery dominant and patent to the vertebrobasilar junction without stenosis. Left PICA patent. Distal right V3 segment attenuated but grossly patent at the upper neck. However, the right vertebral artery is largely occluded as it courses into the cranial vault as is the proximal right V4 segment. Attenuated flow within the distal right V4 segment suspected to be retrograde in nature across the vertebrobasilar junction. Right PICA not seen. Basilar patent to  its distal aspect without stenosis. Superior cerebellar arteries patent bilaterally. Left PCA primarily supplied via the basilar. Right PCA supplied via the basilar as well as a prominent right posterior communicating artery. Left PCA patent to its distal aspect without stenosis. Severe distal right P2/P3 stenosis noted (series 1109, image 14). Right PCA somewhat attenuated distally but remains grossly patent. No intracranial aneurysm. MRA NECK FINDINGS AORTIC ARCH: Examination severely degraded by motion artifact, markedly limiting assessment. Aortic arch and origin of the great vessels not visualized or evaluated  on this exam. RIGHT CAROTID SYSTEM: Right common and internal carotid arteries grossly patent with antegrade flow. No obvious stenosis about the right carotid bifurcation. There is question of a focal severe high-grade stenosis involving the proximal cervical right ICA (series 28, image 126). Finding not entirely certain and may simply be due to vascular tortuosity given the technical limitations of this exam. Partially visualized right ICA otherwise grossly patent distally. LEFT CAROTID SYSTEM: Visualized left CCA grossly patent to the bifurcation without stenosis. Probable atheromatous change about the left carotid bulb with no obvious high-grade stenosis. Partially visualized proximal left ICA grossly patent distally. VERTEBRAL ARTERIES: Origins of the vertebral arteries not visualized on this exam. Left vertebral artery dominant and appears grossly patent within the neck without appreciable stenosis. Attenuated irregular and thready flow within the diminutive right vertebral artery, which appears to be partially occluded within the neck. Again, the right vertebral artery is seen to essentially occluded at the skull base on corresponding intracranial MRA. IMPRESSION: MRI HEAD IMPRESSION: 1. Patchy small volume acute ischemic infarcts involving the right basal ganglia, right occipital lobe, and right cerebellar hemisphere as above. No associated hemorrhage or mass effect. 2. Underlying age-related cerebral atrophy with mild chronic small vessel ischemic disease. MRA HEAD IMPRESSION: 1. Technically limited exam due to motion artifact. 2. 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 Attenuated flow within the distal right V4 segment suspected to be retrograde in nature across the vertebrobasilar junction. 3. Severe distal right P2/P3 stenosis. 4. Otherwise grossly patent intracranial circulation. No other hemodynamically significant or correctable stenosis. MRA NECK IMPRESSION: 1.  Severely limited and nearly nondiagnostic study due to extensive motion artifact. 2. Possible focal severe high-grade stenosis involving the proximal cervical right ICA. Finding not entirely certain and may simply be due to vascular tortuosity given the technical limitations of this exam. Correlation with carotid Doppler ultrasound and/or CTA could be performed for further evaluation as warranted. 3. No appreciable flow-limiting stenosis about the left carotid artery system on this limited exam. 4. Left vertebral artery dominant and grossly patent within the neck. Attenuated and irregular flow within the diminutive right vertebral artery. Again, the right vertebral artery appears to largely occluded at the skull base are corresponding intracranial MRA. Electronically Signed   By: Jeannine Boga M.D.   On: 10/13/2021 00:07   MR ANGIO NECK WO CONTRAST  Result Date: 10/13/2021 CLINICAL DATA:  Initial evaluation for acute stroke. EXAM: MRI HEAD WITHOUT CONTRAST MRA HEAD WITHOUT CONTRAST MRA NECK WITHOUT CONTRAST TECHNIQUE: Multiplanar, multiecho pulse sequences of the brain and surrounding structures were obtained without intravenous contrast. Angiographic images of the Circle of Willis were obtained using MRA technique without intravenous contrast. Angiographic images of the neck were obtained using MRA technique without intravenous contrast. Carotid stenosis measurements (when applicable) are obtained utilizing NASCET criteria, using the distal internal carotid diameter as the denominator. COMPARISON:  Prior head CT from earlier the same day. FINDINGS: MRI  HEAD FINDINGS Brain: Generalized age-related cerebral atrophy. Patchy and confluent T2/FLAIR hyperintensity involving the periventricular deep white matter both cerebral hemispheres most consistent with chronic small vessel ischemic disease, mild for age. Patchy small volume foci of restricted diffusion seen involving the right basal ganglia, with  involvement of the right caudate and lentiform nuclei (series 5, images 30, 29, 27). Tiny cortical infarct noted at the right occipital lobe posteriorly (series 5, image 28). Probable additional occipital infarct noted inferiorly as well (series 5, image 19). 8 mm acute ischemic right cerebellar infarct (series 5, image 14). No associated hemorrhage or mass effect. No other evidence for acute or subacute ischemia. Gray-white matter differentiation otherwise maintained. No other areas of chronic cortical infarction. No acute or chronic intracranial blood products. No mass lesion, midline shift or mass effect. No hydrocephalus or extra-axial fluid collection. Pituitary gland suprasellar region normal. Midline structures intact. Vascular: Irregular flow void within the right vertebral artery, which could be related to slow flow and/or occlusion (series 10, image 3). Major intracranial vascular flow voids are otherwise maintained. Skull and upper cervical spine: Degenerative thickening noted about the tectorial membrane. Craniocervical junction otherwise unremarkable. Bone marrow signal intensity within normal limits. No scalp soft tissue abnormality. Sinuses/Orbits: Patient status post bilateral ocular lens replacement. Paranasal sinuses are largely clear. No significant mastoid effusion. Inner ear structures grossly normal. Other: None. MRA HEAD FINDINGS ANTERIOR CIRCULATION: Visualized distal cervical segments of the internal carotid arteries are patent with antegrade flow. Petrous segments patent bilaterally. Atheromatous irregularity throughout the carotid siphons without hemodynamically significant stenosis. 2 mm outpouching extending posteriorly from the supraclinoid left ICA favored to reflect a small vascular infundibulum related to a hypoplastic left posterior communicating artery. A1 segments patent bilaterally. Right A1 hypoplastic, accounting for the slightly diminutive right ICA is compared to the left.  Normal anterior communicating artery complex. Anterior cerebral arteries patent without stenosis. No M1 stenosis or occlusion. Normal MCA bifurcations. Distal MCA branches perfused and symmetric. POSTERIOR CIRCULATION: Left vertebral artery dominant and patent to the vertebrobasilar junction without stenosis. Left PICA patent. Distal right V3 segment attenuated but grossly patent at the upper neck. However, the right vertebral artery is largely occluded as it courses into the cranial vault as is the proximal right V4 segment. Attenuated flow within the distal right V4 segment suspected to be retrograde in nature across the vertebrobasilar junction. Right PICA not seen. Basilar patent to its distal aspect without stenosis. Superior cerebellar arteries patent bilaterally. Left PCA primarily supplied via the basilar. Right PCA supplied via the basilar as well as a prominent right posterior communicating artery. Left PCA patent to its distal aspect without stenosis. Severe distal right P2/P3 stenosis noted (series 1109, image 14). Right PCA somewhat attenuated distally but remains grossly patent. No intracranial aneurysm. MRA NECK FINDINGS AORTIC ARCH: Examination severely degraded by motion artifact, markedly limiting assessment. Aortic arch and origin of the great vessels not visualized or evaluated on this exam. RIGHT CAROTID SYSTEM: Right common and internal carotid arteries grossly patent with antegrade flow. No obvious stenosis about the right carotid bifurcation. There is question of a focal severe high-grade stenosis involving the proximal cervical right ICA (series 28, image 126). Finding not entirely certain and may simply be due to vascular tortuosity given the technical limitations of this exam. Partially visualized right ICA otherwise grossly patent distally. LEFT CAROTID SYSTEM: Visualized left CCA grossly patent to the bifurcation without stenosis. Probable atheromatous change about the left carotid bulb  with no obvious high-grade  stenosis. Partially visualized proximal left ICA grossly patent distally. VERTEBRAL ARTERIES: Origins of the vertebral arteries not visualized on this exam. Left vertebral artery dominant and appears grossly patent within the neck without appreciable stenosis. Attenuated irregular and thready flow within the diminutive right vertebral artery, which appears to be partially occluded within the neck. Again, the right vertebral artery is seen to essentially occluded at the skull base on corresponding intracranial MRA. IMPRESSION: MRI HEAD IMPRESSION: 1. Patchy small volume acute ischemic infarcts involving the right basal ganglia, right occipital lobe, and right cerebellar hemisphere as above. No associated hemorrhage or mass effect. 2. Underlying age-related cerebral atrophy with mild chronic small vessel ischemic disease. MRA HEAD IMPRESSION: 1. Technically limited exam due to motion artifact. 2. 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 Attenuated flow within the distal right V4 segment suspected to be retrograde in nature across the vertebrobasilar junction. 3. Severe distal right P2/P3 stenosis. 4. Otherwise grossly patent intracranial circulation. No other hemodynamically significant or correctable stenosis. MRA NECK IMPRESSION: 1. Severely limited and nearly nondiagnostic study due to extensive motion artifact. 2. Possible focal severe high-grade stenosis involving the proximal cervical right ICA. Finding not entirely certain and may simply be due to vascular tortuosity given the technical limitations of this exam. Correlation with carotid Doppler ultrasound and/or CTA could be performed for further evaluation as warranted. 3. No appreciable flow-limiting stenosis about the left carotid artery system on this limited exam. 4. Left vertebral artery dominant and grossly patent within the neck. Attenuated and irregular flow within the diminutive right  vertebral artery. Again, the right vertebral artery appears to largely occluded at the skull base are corresponding intracranial MRA. Electronically Signed   By: Jeannine Boga M.D.   On: 10/13/2021 00:07   MR BRAIN WO CONTRAST  Result Date: 10/13/2021 CLINICAL DATA:  Initial evaluation for acute stroke. EXAM: MRI HEAD WITHOUT CONTRAST MRA HEAD WITHOUT CONTRAST MRA NECK WITHOUT CONTRAST TECHNIQUE: Multiplanar, multiecho pulse sequences of the brain and surrounding structures were obtained without intravenous contrast. Angiographic images of the Circle of Willis were obtained using MRA technique without intravenous contrast. Angiographic images of the neck were obtained using MRA technique without intravenous contrast. Carotid stenosis measurements (when applicable) are obtained utilizing NASCET criteria, using the distal internal carotid diameter as the denominator. COMPARISON:  Prior head CT from earlier the same day. FINDINGS: MRI HEAD FINDINGS Brain: Generalized age-related cerebral atrophy. Patchy and confluent T2/FLAIR hyperintensity involving the periventricular deep white matter both cerebral hemispheres most consistent with chronic small vessel ischemic disease, mild for age. Patchy small volume foci of restricted diffusion seen involving the right basal ganglia, with involvement of the right caudate and lentiform nuclei (series 5, images 30, 29, 27). Tiny cortical infarct noted at the right occipital lobe posteriorly (series 5, image 28). Probable additional occipital infarct noted inferiorly as well (series 5, image 19). 8 mm acute ischemic right cerebellar infarct (series 5, image 14). No associated hemorrhage or mass effect. No other evidence for acute or subacute ischemia. Gray-white matter differentiation otherwise maintained. No other areas of chronic cortical infarction. No acute or chronic intracranial blood products. No mass lesion, midline shift or mass effect. No hydrocephalus or  extra-axial fluid collection. Pituitary gland suprasellar region normal. Midline structures intact. Vascular: Irregular flow void within the right vertebral artery, which could be related to slow flow and/or occlusion (series 10, image 3). Major intracranial vascular flow voids are otherwise maintained. Skull and upper cervical  spine: Degenerative thickening noted about the tectorial membrane. Craniocervical junction otherwise unremarkable. Bone marrow signal intensity within normal limits. No scalp soft tissue abnormality. Sinuses/Orbits: Patient status post bilateral ocular lens replacement. Paranasal sinuses are largely clear. No significant mastoid effusion. Inner ear structures grossly normal. Other: None. MRA HEAD FINDINGS ANTERIOR CIRCULATION: Visualized distal cervical segments of the internal carotid arteries are patent with antegrade flow. Petrous segments patent bilaterally. Atheromatous irregularity throughout the carotid siphons without hemodynamically significant stenosis. 2 mm outpouching extending posteriorly from the supraclinoid left ICA favored to reflect a small vascular infundibulum related to a hypoplastic left posterior communicating artery. A1 segments patent bilaterally. Right A1 hypoplastic, accounting for the slightly diminutive right ICA is compared to the left. Normal anterior communicating artery complex. Anterior cerebral arteries patent without stenosis. No M1 stenosis or occlusion. Normal MCA bifurcations. Distal MCA branches perfused and symmetric. POSTERIOR CIRCULATION: Left vertebral artery dominant and patent to the vertebrobasilar junction without stenosis. Left PICA patent. Distal right V3 segment attenuated but grossly patent at the upper neck. However, the right vertebral artery is largely occluded as it courses into the cranial vault as is the proximal right V4 segment. Attenuated flow within the distal right V4 segment suspected to be retrograde in nature across the  vertebrobasilar junction. Right PICA not seen. Basilar patent to its distal aspect without stenosis. Superior cerebellar arteries patent bilaterally. Left PCA primarily supplied via the basilar. Right PCA supplied via the basilar as well as a prominent right posterior communicating artery. Left PCA patent to its distal aspect without stenosis. Severe distal right P2/P3 stenosis noted (series 1109, image 14). Right PCA somewhat attenuated distally but remains grossly patent. No intracranial aneurysm. MRA NECK FINDINGS AORTIC ARCH: Examination severely degraded by motion artifact, markedly limiting assessment. Aortic arch and origin of the great vessels not visualized or evaluated on this exam. RIGHT CAROTID SYSTEM: Right common and internal carotid arteries grossly patent with antegrade flow. No obvious stenosis about the right carotid bifurcation. There is question of a focal severe high-grade stenosis involving the proximal cervical right ICA (series 28, image 126). Finding not entirely certain and may simply be due to vascular tortuosity given the technical limitations of this exam. Partially visualized right ICA otherwise grossly patent distally. LEFT CAROTID SYSTEM: Visualized left CCA grossly patent to the bifurcation without stenosis. Probable atheromatous change about the left carotid bulb with no obvious high-grade stenosis. Partially visualized proximal left ICA grossly patent distally. VERTEBRAL ARTERIES: Origins of the vertebral arteries not visualized on this exam. Left vertebral artery dominant and appears grossly patent within the neck without appreciable stenosis. Attenuated irregular and thready flow within the diminutive right vertebral artery, which appears to be partially occluded within the neck. Again, the right vertebral artery is seen to essentially occluded at the skull base on corresponding intracranial MRA. IMPRESSION: MRI HEAD IMPRESSION: 1. Patchy small volume acute ischemic infarcts  involving the right basal ganglia, right occipital lobe, and right cerebellar hemisphere as above. No associated hemorrhage or mass effect. 2. Underlying age-related cerebral atrophy with mild chronic small vessel ischemic disease. MRA HEAD IMPRESSION: 1. Technically limited exam due to motion artifact. 2. 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 Attenuated flow within the distal right V4 segment suspected to be retrograde in nature across the vertebrobasilar junction. 3. Severe distal right P2/P3 stenosis. 4. Otherwise grossly patent intracranial circulation. No other hemodynamically significant or correctable stenosis. MRA NECK IMPRESSION: 1. Severely limited and nearly nondiagnostic  study due to extensive motion artifact. 2. Possible focal severe high-grade stenosis involving the proximal cervical right ICA. Finding not entirely certain and may simply be due to vascular tortuosity given the technical limitations of this exam. Correlation with carotid Doppler ultrasound and/or CTA could be performed for further evaluation as warranted. 3. No appreciable flow-limiting stenosis about the left carotid artery system on this limited exam. 4. Left vertebral artery dominant and grossly patent within the neck. Attenuated and irregular flow within the diminutive right vertebral artery. Again, the right vertebral artery appears to largely occluded at the skull base are corresponding intracranial MRA. Electronically Signed   By: Jeannine Boga M.D.   On: 10/13/2021 00:07   US Carotid Bilateral  Result Date: 10/14/2021 CLINICAL DATA:  Stroke Hypertension CAD Hyperlipidemia Diabetes EXAM: BILATERAL CAROTID DUPLEX ULTRASOUND TECHNIQUE: Pearline Cables scale imaging, color Doppler and duplex ultrasound were performed of bilateral carotid and vertebral arteries in the neck. COMPARISON:  None. FINDINGS: Criteria: Quantification of carotid stenosis is based on velocity parameters that correlate  the residual internal carotid diameter with NASCET-based stenosis levels, using the diameter of the distal internal carotid lumen as the denominator for stenosis measurement. The following velocity measurements were obtained: RIGHT ICA: 80/19 cm/sec CCA: 16/10 cm/sec SYSTOLIC ICA/CCA RATIO:  1.2 ECA: 74 cm/sec LEFT ICA: 170/27 cm/sec CCA: 96/04 cm/sec SYSTOLIC ICA/CCA RATIO:  1.7 ECA: 76 cm/sec RIGHT CAROTID ARTERY: Mild calcified origin is plaque of the carotid bifurcation. RIGHT VERTEBRAL ARTERY:  Antegrade flow. LEFT CAROTID ARTERY: Mild heterogeneous plaque noted at the carotid bifurcation. LEFT VERTEBRAL ARTERY:  Antegrade flow. IMPRESSION: 1. Elevated peak systolic velocity in the left internal carotid artery indicative of 50-69% stenosis. 2. Less than 50% stenosis of the right internal carotid artery. Electronically Signed   By: Miachel Roux M.D.   On: 10/14/2021 10:02   EEG adult  Result Date: 10/13/2021 Derek Jack, MD     10/13/2021  7:38 PM Routine EEG Report Cassandra [name] is a 88 y.o. female with a history of stroke who is undergoing an EEG to evaluate for seizures. Report: This EEG was acquired with electrodes placed according to the International 10-20 electrode system (including Fp1, Fp2, F3, F4, C3, C4, P3, P4, O1, O2, T3, T4, T5, T6, A1, A2, Fz, Cz, Pz). The following electrodes were missing or displaced: none. The occipital dominant rhythm was 10 Hz. This activity is reactive to stimulation. Drowsiness was manifested by background fragmentation; deeper stages of sleep were identified by K complexes and sleep spindles. There was no focal slowing. There were no interictal epileptiform discharges. There were no electrographic seizures identified. There was no abnormal response to photic stimulation or hyperventilation. Impression: This EEG was obtained while awake and asleep and is normal.   Clinical Correlation: Normal EEGs, however, do not rule out epilepsy. Su Monks, MD [facility] [phone] If 7pm- 7am, please page neurology on call as listed in [facility].   ECHOCARDIOGRAM COMPLETE  Result Date: 10/14/2021    ECHOCARDIOGRAM REPORT   Patient Name:   Cassandra [name] Date of Exam: 10/14/2021 Medical Rec #:  [id number]       Height:       60.0 in Accession #:    [id number]      Weight:       144.0 lb Date of Birth:  08-14-1927      BSA:          1.623 m Patient Age:    56 years  BP:           176/68 mmHg Patient Gender: F               HR:           80 bpm. Exam Location:  [facility] Procedure: 2D Echo, Cardiac Doppler and Color Doppler Indications:     Stroke I63.9  History:         Patient has no prior history of Echocardiogram examinations.                  CAD; Risk Factors:Diabetes and Hypertension.  Sonographer:     Sherrie Sport Referring Phys:  [id number] Athena Masse Diagnosing Phys: Serafina Royals MD IMPRESSIONS  1. Left ventricular ejection fraction, by estimation, is 60 to 65%. The left ventricle has normal function. The left ventricle has no regional wall motion abnormalities. Left ventricular diastolic parameters were normal.  2. Right ventricular systolic function is normal. The right ventricular size is normal.  3. The mitral valve is normal in structure. Trivial mitral valve regurgitation.  4. The aortic valve is normal in structure. Aortic valve regurgitation is mild. FINDINGS  Left Ventricle: Left ventricular ejection fraction, by estimation, is 60 to 65%. The left ventricle has normal function. The left ventricle has no regional wall motion abnormalities. The left ventricular internal cavity size was normal in size. There is  no left ventricular hypertrophy. Left ventricular diastolic parameters were normal. Right Ventricle: The right ventricular size is normal. No increase in right ventricular wall thickness. Right ventricular systolic function is normal. Left Atrium: Left atrial size was normal in size. Right Atrium: Right atrial size was normal in size.  Pericardium: There is no evidence of pericardial effusion. Mitral Valve: The mitral valve is normal in structure. Trivial mitral valve regurgitation. MV peak gradient, 6.6 mmHg. The mean mitral valve gradient is 2.0 mmHg. Tricuspid Valve: The tricuspid valve is normal in structure. Tricuspid valve regurgitation is trivial. Aortic Valve: The aortic valve is normal in structure. Aortic valve regurgitation is mild. Aortic regurgitation PHT measures 522 msec. Aortic valve mean gradient measures 4.0 mmHg. Aortic valve peak gradient measures 5.9 mmHg. Aortic valve area, by VTI measures 2.14 cm. Pulmonic Valve: The pulmonic valve was normal in structure. Pulmonic valve regurgitation is not visualized. Aorta: The aortic root and ascending aorta are structurally normal, with no evidence of dilitation. IAS/Shunts: No atrial level shunt detected by color flow Doppler.  LEFT VENTRICLE PLAX 2D LVIDd:         4.70 cm   Diastology LVIDs:         3.00 cm   LV e' medial:    2.61 cm/s LV PW:         1.10 cm   LV E/e' medial:  27.7 LV IVS:        0.90 cm   LV e' lateral:   5.22 cm/s LVOT diam:     2.00 cm   LV E/e' lateral: 13.9 LV SV:         55 LV SV Index:   34 LVOT Area:     3.14 cm  RIGHT VENTRICLE RV Basal diam:  2.20 cm RV S prime:     13.20 cm/s TAPSE (M-mode): 2.4 cm LEFT ATRIUM             Index        RIGHT ATRIUM           Index LA diam:  3.60 cm 2.22 cm/m   RA Area:     12.50 cm LA Vol (A2C):   43.4 ml 26.73 ml/m  RA Volume:   28.20 ml  17.37 ml/m LA Vol (A4C):   31.1 ml 19.16 ml/m LA Biplane Vol: 37.0 ml 22.79 ml/m  AORTIC VALVE                    PULMONIC VALVE AV Area (Vmax):    2.20 cm     PV Vmax:        0.85 m/s AV Area (Vmean):   2.02 cm     PV Vmean:       54.200 cm/s AV Area (VTI):     2.14 cm     PV VTI:         0.170 m AV Vmax:           121.00 cm/s  PV Peak grad:   2.9 mmHg AV Vmean:          90.700 cm/s  PV Mean grad:   1.0 mmHg AV VTI:            0.256 m      RVOT Peak grad: 3 mmHg AV Peak  Grad:      5.9 mmHg AV Mean Grad:      4.0 mmHg LVOT Vmax:         84.90 cm/s LVOT Vmean:        58.300 cm/s LVOT VTI:          0.174 m LVOT/AV VTI ratio: 0.68 AI PHT:            522 msec  AORTA Ao Root diam: 2.50 cm MITRAL VALVE                TRICUSPID VALVE MV Area (PHT): 4.54 cm     TR Peak grad:   25.8 mmHg MV Area VTI:   1.98 cm     TR Vmax:        254.00 cm/s MV Peak grad:  6.6 mmHg MV Mean grad:  2.0 mmHg     SHUNTS MV Vmax:       1.28 m/s     Systemic VTI:  0.17 m MV Vmean:      61.2 cm/s    Systemic Diam: 2.00 cm MV Decel Time: 167 msec     Pulmonic VTI:  0.213 m MV E velocity: 72.40 cm/s MV A velocity: 144.00 cm/s MV E/A ratio:  0.50 Serafina Royals MD Electronically signed by Serafina Royals MD Signature Date/Time: 10/14/2021/4:09:51 PM    Final     Microbiology: Recent Results (from the past 240 hour(s))  Resp Panel by RT-PCR (Flu A&B, Covid) Nasopharyngeal Swab     Status: None   Collection Time: 10/12/21 10:56 PM   Specimen: Nasopharyngeal Swab; Nasopharyngeal(NP) swabs in vial transport medium  Result Value Ref Range Status   SARS Coronavirus 2 by RT PCR NEGATIVE NEGATIVE Final    Comment: (NOTE) SARS-CoV-2 target nucleic acids are NOT DETECTED.  The SARS-CoV-2 RNA is generally detectable in upper respiratory specimens during the acute phase of infection. The lowest concentration of SARS-CoV-2 viral copies this assay can detect is 138 copies/mL. A negative result does not preclude SARS-Cov-2 infection and should not be used as the sole basis for treatment or other patient management decisions. A negative result may occur with  improper specimen collection/handling, submission of specimen other than nasopharyngeal swab, presence of viral mutation(s) within  the areas targeted by this assay, and inadequate number of viral copies(<138 copies/mL). A negative result must be combined with clinical observations, patient history, and epidemiological information. The expected result is  Negative.  Fact Sheet for Patients:  EntrepreneurPulse.com.au  Fact Sheet for Healthcare Providers:  IncredibleEmployment.be  This test is no t yet approved or cleared by the [place] FDA and  has been authorized for detection and/or diagnosis of SARS-CoV-2 by FDA under an Emergency Use Authorization (EUA). This EUA will remain  in effect (meaning this test can be used) for the duration of the COVID-19 declaration under Section 564(b)(1) of the Act, 21 U.S.C.section 360bbb-3(b)(1), unless the authorization is terminated  or revoked sooner.       Influenza A by PCR NEGATIVE NEGATIVE Final   Influenza B by PCR NEGATIVE NEGATIVE Final    Comment: (NOTE) The Xpert Xpress SARS-CoV-2/FLU/RSV plus assay is intended as an aid in the diagnosis of influenza from Nasopharyngeal swab specimens and should not be used as a sole basis for treatment. Nasal washings and aspirates are unacceptable for Xpert Xpress SARS-CoV-2/FLU/RSV testing.  Fact Sheet for Patients: EntrepreneurPulse.com.au  Fact Sheet for Healthcare Providers: IncredibleEmployment.be  This test is not yet approved or cleared by the [place] FDA and has been authorized for detection and/or diagnosis of SARS-CoV-2 by FDA under an Emergency Use Authorization (EUA). This EUA will remain in effect (meaning this test can be used) for the duration of the COVID-19 declaration under Section 564(b)(1) of the Act, 21 U.S.C. section 360bbb-3(b)(1), unless the authorization is terminated or revoked.  Performed at [facility], [address] [place], [place] [address]      Labs: CBC: Recent Labs  Lab 10/12/21 2020 10/13/21 0541 10/14/21 0959 10/15/21 0347  WBC 17.0* 15.2* 14.5* 14.5*  NEUTROABS 13.4*  --   --   --   HGB 14.6 13.2 14.9 13.4  HCT 47.1* 43.4 48.3* 44.2  MCV 93.3 91.8 91.7 91.5  PLT 603* 557* 639* 632*   Basic  Metabolic Panel: Recent Labs  Lab 10/12/21 2020 10/13/21 0541 10/14/21 0959 10/15/21 0347  NA 137  --  141 139  K 4.7  --  3.9 4.5  CL 109  --  112* 113*  CO2 21*  --  22 23  GLUCOSE 132*  --  113* 88  BUN 20  --  14 14  CREATININE 2.44* 2.45* 2.39* 2.34*  CALCIUM 8.3*  --  8.5* 8.2*  MG  --   --  2.0 1.8  PHOS  --   --  3.1 3.2   Liver Function Tests: Recent Labs  Lab 10/12/21 2020  AST 27  ALT 12  ALKPHOS 55  BILITOT 0.7  PROT 6.8  ALBUMIN 2.8*   No results for input(s): LIPASE, AMYLASE in the last 168 hours. No results for input(s): AMMONIA in the last 168 hours. Cardiac Enzymes: No results for input(s): CKTOTAL, CKMB, CKMBINDEX, TROPONINI in the last 168 hours. BNP (last 3 results) No results for input(s): BNP in the last 8760 hours. CBG: Recent Labs  Lab 10/14/21 1134 10/14/21 1747 10/14/21 2055 10/15/21 0802 10/15/21 1211  GLUCAP 138* 95 99 82 89    Time spent: 35 minutes  Signed:  Val Riles  [facility]  10/15/2021 2:40 PM

## 2021-10-17 LAB — LYME DISEASE SEROLOGY W/REFLEX: Lyme Total Antibody EIA: NEGATIVE

## 2021-11-16 ENCOUNTER — Other Ambulatory Visit (HOSPITAL_COMMUNITY): Payer: Self-pay | Admitting: Family Medicine

## 2021-11-16 ENCOUNTER — Other Ambulatory Visit: Payer: Self-pay | Admitting: Family Medicine

## 2021-11-16 DIAGNOSIS — M25512 Pain in left shoulder: Secondary | ICD-10-CM

## 2021-11-16 DIAGNOSIS — M542 Cervicalgia: Secondary | ICD-10-CM

## 2021-11-16 DIAGNOSIS — M79602 Pain in left arm: Secondary | ICD-10-CM

## 2021-11-28 ENCOUNTER — Ambulatory Visit
Admission: RE | Admit: 2021-11-28 | Discharge: 2021-11-28 | Disposition: A | Discharge status: Home / Self Care | Payer: Medicare Other | Source: Ambulatory Visit | Attending: Family Medicine | Admitting: Family Medicine

## 2021-11-28 ENCOUNTER — Other Ambulatory Visit: Payer: Self-pay

## 2021-11-28 DIAGNOSIS — M542 Cervicalgia: Secondary | ICD-10-CM | POA: Diagnosis not present

## 2021-11-28 DIAGNOSIS — M79602 Pain in left arm: Secondary | ICD-10-CM | POA: Insufficient documentation

## 2021-11-28 DIAGNOSIS — M25512 Pain in left shoulder: Secondary | ICD-10-CM

## 2021-11-30 ENCOUNTER — Ambulatory Visit (INDEPENDENT_AMBULATORY_CARE_PROVIDER_SITE_OTHER): Payer: Medicare Other | Admitting: Neurology

## 2021-11-30 ENCOUNTER — Encounter: Payer: Self-pay | Admitting: Neurology

## 2021-11-30 VITALS — BP 195/94 | HR 66 | Ht 60.0 in | Wt 144.0 lb

## 2021-11-30 DIAGNOSIS — I639 Cerebral infarction, unspecified: Secondary | ICD-10-CM

## 2021-11-30 DIAGNOSIS — G6289 Other specified polyneuropathies: Secondary | ICD-10-CM

## 2021-11-30 NOTE — Patient Instructions (Addendum)
Continue current medications  ?Continue with physical therapy  ?Drink plenty of fluid  ?Return in 1 year  ?

## 2021-11-30 NOTE — Progress Notes (Signed)
[facility]  PATIENT: Cassandra [name] DOB: 1927-06-20  REQUESTING CLINICIAN: Derek Jack, MD HISTORY FROM: Patient  REASON FOR VISIT: Stroke    HISTORICAL  CHIEF COMPLAINT:  Chief Complaint  Patient presents with   New Patient (Initial Visit)    Rm 12. Alone. NP/ED referral for CVA f/u/sched with pt over phone.    HISTORY OF PRESENT ILLNESS:  88 year old woman with vascular risk factor including diabetes mellitus, hypertension, hyperlipidemia who is presenting to the clinic after being admitted to the hospital following a right hemispheric stroke.  Patient presented to the ED due to sudden onset of left-sided weakness and garbled speech, CT head did not show any acute finding but MRI of the brain showed acute ischemic infarcts involving the right basal ganglia, right occipital lobe and right cerebellar hemisphere.  Her angiogram shows severe right distal P2 P3 stenosis and occlusion of the right vertebral artery.  Her strokes were multifocal and were due to likely dehydration or drop in blood pressure.  She was loaded on aspirin 325 mg and started on aspirin 81 mg daily, her echocardiogram show EF of 60 to 65%, she also had an EEG which was negative for any acute seizure.  Her left-sided weakness improved, she was seen and cleared by PT for an acute rehab. Since leaving the hospital, patient reported her symptoms improved, currently she is getting physical therapy once a week but knows to continue self physical therapy at home.    OTHER MEDICAL CONDITIONS: Hypertension, hypothyroidism, hyperlipidemia, RLS, Diabetes    REVIEW OF SYSTEMS: Full 14 system review of systems performed and negative with exception of: as noted in the HPI   ALLERGIES: Allergies  Allergen Reactions   Atorvastatin Other (See Comments)    MYALGIA   Cyclobenzaprine Other (See Comments)   Hydrocodone-Acetaminophen     Other reaction(s): Hallucination when taking whole pill, pt  tolerates taking 1/2 pill   Mirtazapine     Other reaction(s): Hallucination   Oxycodone-Acetaminophen Other (See Comments)   Paroxetine Hcl Other (See Comments)   Penicillins Swelling    Lip and orbital swelling   Propoxyphene Other (See Comments)   Trazodone Other (See Comments)    HOME MEDICATIONS: Outpatient Medications Prior to Visit  Medication Sig Dispense Refill   albuterol (VENTOLIN HFA) 108 (90 Base) MCG/ACT inhaler Inhale into the lungs.     aspirin EC 81 MG EC tablet Take 1 tablet (81 mg total) by mouth daily. Swallow whole. 30 tablet 11   azelastine (ASTELIN) 0.1 % nasal spray Place 2 sprays into both nostrils 2 (two) times daily.     cetirizine (ZYRTEC) 10 MG tablet Take 1 tablet by mouth daily as needed for allergies.     Cholecalciferol 25 MCG (1000 UT) tablet Take 1,000 Units by mouth daily.     DROXIA 300 MG capsule TAKE 1 CAPSULE (300 MG TOTAL) BY MOUTH DAILY. MAY TAKE WITH FOOD TO MINIMIZE GI SIDE EFFECTS. 30 capsule 0   glucose blood test strip Use once daily. Use as instructed.     latanoprost (XALATAN) 0.005 % ophthalmic solution Place 1 drop into both eyes at bedtime.     levothyroxine (SYNTHROID) 88 MCG tablet Take 88 mcg by mouth daily. Take on an empty stomach 30 to 60 minutes before breakfast     LOTREL 5-20 MG capsule Take 1 capsule by mouth daily.     melatonin 5 MG TABS Take 1 tablet (5 mg total) by mouth at bedtime. 90 tablet  0   metoprolol succinate (TOPROL-XL) 50 MG 24 hr tablet Take 1 tablet by mouth daily.     naloxone (NARCAN) nasal spray 4 mg/0.1 mL Place 1 spray into the nose once as needed. Place 1 spray (4 mg total) into one nostril once as needed (if not breathing.) for up to 1 dose     pantoprazole (PROTONIX) 40 MG tablet Take 1 tablet (40 mg total) by mouth daily. 30 tablet 2   polyethylene glycol (MIRALAX / GLYCOLAX) 17 g packet Take 17 g by mouth daily. 14 each 0   rOPINIRole (REQUIP) 0.25 MG tablet Take 0.25 mg by mouth at bedtime.      rosuvastatin (CRESTOR) 20 MG tablet Take 20 mg by mouth daily.     timolol (TIMOPTIC-XR) 0.5 % ophthalmic gel-forming Place 1 drop into both eyes every morning.     vitamin B-12 (CYANOCOBALAMIN) 500 MCG tablet Take 1 tablet (500 mcg total) by mouth daily. 90 tablet 0   Vitamin D, Ergocalciferol, (DRISDOL) 1.25 MG (50000 UNIT) CAPS capsule Take 1 capsule (50,000 Units total) by mouth every 7 (seven) days. 12 capsule 0   cloNIDine (CATAPRES) 0.1 MG tablet Take 1 tablet (0.1 mg total) by mouth 2 (two) times daily. 60 tablet 0   gabapentin (NEURONTIN) 300 MG capsule Take 1 capsule by mouth 2 (two) times daily.     No facility-administered medications prior to visit.    PAST MEDICAL HISTORY: Past Medical History:  Diagnosis Date   Arthritis    osteoarthritis in hands, knees ( R torn cartilage, L meniscus removed)    CAD (coronary artery disease)    Diabetes mellitus without complication (HCC)    Glaucoma    Hyperlipidemia    Hypertension    Hypothyroidism     PAST SURGICAL HISTORY: Past Surgical History:  Procedure Laterality Date   ABDOMINAL HYSTERECTOMY     CORONARY ANGIOPLASTY WITH STENT PLACEMENT     FOOT SURGERY Bilateral    KNEE ARTHROSCOPY W/ MENISCECTOMY Left    Also has R knee injury (torn cartilage)     FAMILY HISTORY: Family History  Problem Relation Age of Onset   Hypertension Mother    Hypertension Father    Early death Father    Heart disease Father     SOCIAL HISTORY: Social History   Socioeconomic History   Marital status: Married    Spouse name: Not on file   Number of children: Not on file   Years of education: Not on file   Highest education level: Not on file  Occupational History   Not on file  Tobacco Use   Smoking status: Former    Types: Cigarettes    Quit date: 01/21/1980    Years since quitting: 41.8   Smokeless tobacco: Never  Substance and Sexual Activity   Alcohol use: Yes    Alcohol/week: 0.0 standard drinks    Comment:  occasionally   Drug use: No   Sexual activity: Never  Other Topics Concern   Not on file  Social History Narrative   Not on file   Social Determinants of Health   Financial Resource Strain: Not on file  Food Insecurity: Not on file  Transportation Needs: Not on file  Physical Activity: Not on file  Stress: Not on file  Social Connections: Not on file  Intimate Partner Violence: Not on file    PHYSICAL EXAM  GENERAL EXAM/CONSTITUTIONAL: Vitals:  Vitals:   11/30/21 1245 11/30/21 1250  BP: (!) 194/101 Marland Kitchen)  195/94  Pulse: 66 66  Weight: 144 lb (65.3 kg)   Height: 5' (1.524 m)    Body mass index is 28.12 kg/m. Wt Readings from Last 3 Encounters:  11/30/21 144 lb (65.3 kg)  10/12/21 144 lb (65.3 kg)  10/02/21 145 lb (65.8 kg)   Patient is in no distress; well developed, nourished and groomed; neck is supple  CARDIOVASCULAR: Examination of carotid arteries is normal; no carotid bruits Regular rate and rhythm, no murmurs Examination of peripheral vascular system by observation and palpation is normal  EYES: Pupils round and reactive to light, Visual fields full to confrontation, Extraocular movements intacts,   MUSCULOSKELETAL: Gait, strength, tone, movements noted in Neurologic exam below  NEUROLOGIC: MENTAL STATUS:  No flowsheet data found. awake, alert, oriented to person, place and time recent and remote memory intact normal attention and concentration language fluent, comprehension intact, naming intact fund of knowledge appropriate  CRANIAL NERVE:  2nd, 3rd, 4th, 6th - pupils equal and reactive to light, visual fields full to confrontation, extraocular muscles intact, no nystagmus 5th - facial sensation symmetric 7th - facial strength symmetric 8th - hearing intact 9th - palate elevates symmetrically, uvula midline 11th - shoulder shrug symmetric 12th - tongue protrusion midline  MOTOR:  normal bulk and tone, full strength in the BUE, BLE. RUE is  limited by pain. Reports pain in right shoulder since getting her COVID injection.   SENSORY:  normal and symmetric to light touch  COORDINATION:  finger-nose-finger, fine finger movements normal  REFLEXES:  deep tendon reflexes present and symmetric  GAIT/STATION:  normal   DIAGNOSTIC DATA (LABS, IMAGING, TESTING) - I reviewed patient records, labs, notes, testing and imaging myself where available.  Lab Results  Component Value Date   WBC 14.5 (H) 10/15/2021   HGB 13.4 10/15/2021   HCT 44.2 10/15/2021   MCV 91.5 10/15/2021   PLT 632 (H) 10/15/2021      Component Value Date/Time   NA 139 10/15/2021 0347   NA 137 12/07/2012 1641   K 4.5 10/15/2021 0347   K 4.2 12/07/2012 1641   CL 113 (H) 10/15/2021 0347   CL 106 12/07/2012 1641   CO2 23 10/15/2021 0347   CO2 27 12/07/2012 1641   GLUCOSE 88 10/15/2021 0347   GLUCOSE 75 12/07/2012 1641   BUN 14 10/15/2021 0347   BUN 18 12/07/2012 1641   CREATININE 2.34 (H) 10/15/2021 0347   CREATININE 1.13 12/07/2012 1641   CALCIUM 8.2 (L) 10/15/2021 0347   CALCIUM 8.9 12/07/2012 1641   PROT 6.8 10/12/2021 2020   PROT 8.6 (H) 12/07/2012 1641   ALBUMIN 2.8 (L) 10/12/2021 2020   ALBUMIN 3.2 (L) 12/07/2012 1641   AST 27 10/12/2021 2020   AST 37 12/07/2012 1641   ALT 12 10/12/2021 2020   ALT 33 12/07/2012 1641   ALKPHOS 55 10/12/2021 2020   ALKPHOS 72 12/07/2012 1641   BILITOT 0.7 10/12/2021 2020   BILITOT 0.4 12/07/2012 1641   GFRNONAA 19 (L) 10/15/2021 0347   GFRNONAA 45 (L) 12/07/2012 1641   GFRAA 25 (L) 03/30/2020 1026   GFRAA 52 (L) 12/07/2012 1641   Lab Results  Component Value Date   CHOL 176 10/13/2021   HDL 44 10/13/2021   LDLCALC 115 (H) 10/13/2021   TRIG 83 10/13/2021   CHOLHDL 4.0 10/13/2021   Lab Results  Component Value Date   HGBA1C 6.4 (H) 10/13/2021   HGBA1C 6.4 (H) 10/13/2021   Lab Results  Component Value Date  [id number] 324 10/13/2021   Lab Results  Component Value Date   TSH 12.489 (H)  10/13/2021    MRI HEAD IMPRESSION: 1. Patchy small volume acute ischemic infarcts involving the right basal ganglia, right occipital lobe, and right cerebellar hemisphere as above. No associated hemorrhage or mass effect. 2. Underlying age-related cerebral atrophy with mild chronic small vessel ischemic disease.   MRA HEAD IMPRESSION: 1. Technically limited exam due to motion artifact. 2. 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 Attenuated flow within the distal right V4 segment suspected to be retrograde in nature across the vertebrobasilar junction. 3. Severe distal right P2/P3 stenosis. 4. Otherwise grossly patent intracranial circulation. No other hemodynamically significant or correctable stenosis.   MRA NECK IMPRESSION: 1. Severely limited and nearly nondiagnostic study due to extensive motion artifact. 2. Possible focal severe high-grade stenosis involving the proximal cervical right ICA. Finding not entirely certain and may simply be due to vascular tortuosity given the technical limitations of this exam. Correlation with carotid Doppler ultrasound and/or CTA could be performed for further evaluation as warranted. 3. No appreciable flow-limiting stenosis about the left carotid artery system on this limited exam. 4. Left vertebral artery dominant and grossly patent within the neck. Attenuated and irregular flow within the diminutive right vertebral artery. Again, the right vertebral artery appears to largely occluded at the skull base are corresponding intracranial MRA.   Carotid US  1. Elevated peak systolic velocity in the left internal carotid artery indicative of 50-69% stenosis. 2. Less than 50% stenosis of the right internal carotid artery.  ASSESSMENT AND PLAN  88 y.o. year old female with vascular risk factor including hypertension, hyperlipidemia, diabetes mellitus who presented to the ED following acute onset of left-sided weakness found  to have multiple stroke involving the right basal ganglia, right occipital lobe and right cerebellar hemisphere.  Patient stroke etiology likely due to large vessel disease. She was loaded with aspirin, started on aspirin 81 mg daily.  She reports full strength in her left side, continue with physical therapy.  At this point I will recommend patient to continue all medications, continue with aggressive physical therapy and I will follow-up in 1 year.   1. Acute CVA (cerebrovascular accident) ([place])   2. Other polyneuropathy     Patient Instructions  Continue current medications  Continue with physical therapy  Drink plenty of fluid  Return in 1 year   No orders of the defined types were placed in this encounter.   No orders of the defined types were placed in this encounter.   Return in about 1 year (around 12/01/2022).  I have spent a total of 45 minutes dedicated to this patient today, preparing to see patient, performing a medically appropriate examination and evaluation, ordering tests and/or medications and procedures, and counseling and educating the patient/family/caregiver; independently interpreting result and communicating results to the family/patient/caregiver; and documenting clinical information in the electronic medical record.   Alric Ran, MD 11/30/2021, 4:45 PM  [facility] [address] [place], [place] [address] [phone]

## 2021-12-10 ENCOUNTER — Other Ambulatory Visit: Payer: Self-pay

## 2021-12-10 ENCOUNTER — Inpatient Hospital Stay
Admission: EM | Admit: 2021-12-10 | Discharge: 2021-12-15 | DRG: 378 | Disposition: A | Discharge status: Home / Self Care | Payer: Medicare Other | Attending: Internal Medicine | Admitting: Internal Medicine

## 2021-12-10 DIAGNOSIS — K8020 Calculus of gallbladder without cholecystitis without obstruction: Secondary | ICD-10-CM | POA: Diagnosis present

## 2021-12-10 DIAGNOSIS — K922 Gastrointestinal hemorrhage, unspecified: Principal | ICD-10-CM

## 2021-12-10 DIAGNOSIS — K5791 Diverticulosis of intestine, part unspecified, without perforation or abscess with bleeding: Principal | ICD-10-CM | POA: Diagnosis present

## 2021-12-10 DIAGNOSIS — E119 Type 2 diabetes mellitus without complications: Secondary | ICD-10-CM

## 2021-12-10 DIAGNOSIS — M19042 Primary osteoarthritis, left hand: Secondary | ICD-10-CM | POA: Diagnosis present

## 2021-12-10 DIAGNOSIS — E876 Hypokalemia: Secondary | ICD-10-CM | POA: Diagnosis not present

## 2021-12-10 DIAGNOSIS — E785 Hyperlipidemia, unspecified: Secondary | ICD-10-CM | POA: Diagnosis present

## 2021-12-10 DIAGNOSIS — Z7982 Long term (current) use of aspirin: Secondary | ICD-10-CM

## 2021-12-10 DIAGNOSIS — I10 Essential (primary) hypertension: Secondary | ICD-10-CM | POA: Diagnosis present

## 2021-12-10 DIAGNOSIS — Z955 Presence of coronary angioplasty implant and graft: Secondary | ICD-10-CM

## 2021-12-10 DIAGNOSIS — E1122 Type 2 diabetes mellitus with diabetic chronic kidney disease: Secondary | ICD-10-CM | POA: Diagnosis present

## 2021-12-10 DIAGNOSIS — K921 Melena: Secondary | ICD-10-CM | POA: Diagnosis not present

## 2021-12-10 DIAGNOSIS — N184 Chronic kidney disease, stage 4 (severe): Secondary | ICD-10-CM | POA: Diagnosis present

## 2021-12-10 DIAGNOSIS — Z885 Allergy status to narcotic agent status: Secondary | ICD-10-CM

## 2021-12-10 DIAGNOSIS — I959 Hypotension, unspecified: Secondary | ICD-10-CM | POA: Diagnosis present

## 2021-12-10 DIAGNOSIS — I700 Atherosclerosis of aorta: Secondary | ICD-10-CM | POA: Diagnosis present

## 2021-12-10 DIAGNOSIS — Z79899 Other long term (current) drug therapy: Secondary | ICD-10-CM

## 2021-12-10 DIAGNOSIS — D75838 Other thrombocytosis: Secondary | ICD-10-CM | POA: Diagnosis present

## 2021-12-10 DIAGNOSIS — E875 Hyperkalemia: Secondary | ICD-10-CM | POA: Diagnosis not present

## 2021-12-10 DIAGNOSIS — N179 Acute kidney failure, unspecified: Secondary | ICD-10-CM | POA: Diagnosis present

## 2021-12-10 DIAGNOSIS — Z20822 Contact with and (suspected) exposure to covid-19: Secondary | ICD-10-CM | POA: Diagnosis present

## 2021-12-10 DIAGNOSIS — I2510 Atherosclerotic heart disease of native coronary artery without angina pectoris: Secondary | ICD-10-CM | POA: Diagnosis present

## 2021-12-10 DIAGNOSIS — I129 Hypertensive chronic kidney disease with stage 1 through stage 4 chronic kidney disease, or unspecified chronic kidney disease: Secondary | ICD-10-CM | POA: Diagnosis present

## 2021-12-10 DIAGNOSIS — D62 Acute posthemorrhagic anemia: Secondary | ICD-10-CM | POA: Diagnosis present

## 2021-12-10 DIAGNOSIS — Z8673 Personal history of transient ischemic attack (TIA), and cerebral infarction without residual deficits: Secondary | ICD-10-CM

## 2021-12-10 DIAGNOSIS — Z79890 Hormone replacement therapy (postmenopausal): Secondary | ICD-10-CM

## 2021-12-10 DIAGNOSIS — E039 Hypothyroidism, unspecified: Secondary | ICD-10-CM | POA: Diagnosis present

## 2021-12-10 DIAGNOSIS — D75839 Thrombocytosis, unspecified: Secondary | ICD-10-CM | POA: Diagnosis present

## 2021-12-10 DIAGNOSIS — N189 Chronic kidney disease, unspecified: Secondary | ICD-10-CM

## 2021-12-10 DIAGNOSIS — M19041 Primary osteoarthritis, right hand: Secondary | ICD-10-CM | POA: Diagnosis present

## 2021-12-10 DIAGNOSIS — E663 Overweight: Secondary | ICD-10-CM | POA: Diagnosis present

## 2021-12-10 DIAGNOSIS — G2581 Restless legs syndrome: Secondary | ICD-10-CM | POA: Diagnosis present

## 2021-12-10 DIAGNOSIS — Z87891 Personal history of nicotine dependence: Secondary | ICD-10-CM

## 2021-12-10 DIAGNOSIS — D72829 Elevated white blood cell count, unspecified: Secondary | ICD-10-CM | POA: Diagnosis present

## 2021-12-10 DIAGNOSIS — Z880 Allergy status to penicillin: Secondary | ICD-10-CM

## 2021-12-10 DIAGNOSIS — Z8249 Family history of ischemic heart disease and other diseases of the circulatory system: Secondary | ICD-10-CM

## 2021-12-10 DIAGNOSIS — Z6827 Body mass index (BMI) 27.0-27.9, adult: Secondary | ICD-10-CM

## 2021-12-10 DIAGNOSIS — Z888 Allergy status to other drugs, medicaments and biological substances status: Secondary | ICD-10-CM

## 2021-12-10 LAB — CBC WITH DIFFERENTIAL/PLATELET
Abs Immature Granulocytes: 0.21 10*3/uL — ABNORMAL HIGH (ref 0.00–0.07)
Basophils Absolute: 0.1 10*3/uL (ref 0.0–0.1)
Basophils Relative: 1 %
Eosinophils Absolute: 0.4 10*3/uL (ref 0.0–0.5)
Eosinophils Relative: 2 %
HCT: 45.7 % (ref 36.0–46.0)
Hemoglobin: 13.7 g/dL (ref 12.0–15.0)
Immature Granulocytes: 1 %
Lymphocytes Relative: 19 %
Lymphs Abs: 3.3 10*3/uL (ref 0.7–4.0)
MCH: 26.6 pg (ref 26.0–34.0)
MCHC: 30 g/dL (ref 30.0–36.0)
MCV: 88.7 fL (ref 80.0–100.0)
Monocytes Absolute: 1.7 10*3/uL — ABNORMAL HIGH (ref 0.1–1.0)
Monocytes Relative: 10 %
Neutro Abs: 11.7 10*3/uL — ABNORMAL HIGH (ref 1.7–7.7)
Neutrophils Relative %: 67 %
Platelets: 856 10*3/uL — ABNORMAL HIGH (ref 150–400)
RBC: 5.15 MIL/uL — ABNORMAL HIGH (ref 3.87–5.11)
RDW: 16.9 % — ABNORMAL HIGH (ref 11.5–15.5)
WBC: 17.3 10*3/uL — ABNORMAL HIGH (ref 4.0–10.5)
nRBC: 0 % (ref 0.0–0.2)

## 2021-12-10 LAB — COMPREHENSIVE METABOLIC PANEL
ALT: 9 U/L (ref 0–44)
AST: 27 U/L (ref 15–41)
Albumin: 2.9 g/dL — ABNORMAL LOW (ref 3.5–5.0)
Alkaline Phosphatase: 47 U/L (ref 38–126)
Anion gap: 6 (ref 5–15)
BUN: 35 mg/dL — ABNORMAL HIGH (ref 8–23)
CO2: 22 mmol/L (ref 22–32)
Calcium: 8.7 mg/dL — ABNORMAL LOW (ref 8.9–10.3)
Chloride: 111 mmol/L (ref 98–111)
Creatinine, Ser: 2.67 mg/dL — ABNORMAL HIGH (ref 0.44–1.00)
GFR, Estimated: 16 mL/min — ABNORMAL LOW (ref >60)
Glucose, Bld: 157 mg/dL — ABNORMAL HIGH (ref 70–99)
Potassium: 5 mmol/L (ref 3.5–5.1)
Sodium: 139 mmol/L (ref 135–145)
Total Bilirubin: 0.7 mg/dL (ref 0.3–1.2)
Total Protein: 7.3 g/dL (ref 6.5–8.1)

## 2021-12-10 LAB — PROTIME-INR
INR: 1.1 (ref 0.8–1.2)
Prothrombin Time: 14.1 seconds (ref 11.4–15.2)

## 2021-12-10 MED ORDER — PANTOPRAZOLE SODIUM 40 MG IV SOLR: 40.0000 mg | Freq: Two times a day (BID) | INTRAVENOUS | Status: DC

## 2021-12-10 MED ORDER — PANTOPRAZOLE 80MG IVPB - SIMPLE MED
80.0000 mg | Freq: Once | INTRAVENOUS | Status: AC
  Administered 2021-12-11: 80 mg via INTRAVENOUS
  Filled 2021-12-10: qty 100

## 2021-12-10 MED ORDER — PANTOPRAZOLE INFUSION (NEW) - SIMPLE MED
8.0000 mg/h | INTRAVENOUS | Status: DC
  Administered 2021-12-11 – 2021-12-13 (×6): 8 mg/h via INTRAVENOUS
  Filled 2021-12-10 (×8): qty 100

## 2021-12-10 MED ORDER — ONDANSETRON HCL 4 MG/2ML IJ SOLN
4.0000 mg | Freq: Once | INTRAMUSCULAR | Status: AC
  Administered 2021-12-10: 4 mg via INTRAVENOUS
  Filled 2021-12-10: qty 2

## 2021-12-10 MED ORDER — FENTANYL CITRATE PF 50 MCG/ML IJ SOSY
50.0000 ug | PREFILLED_SYRINGE | Freq: Once | INTRAMUSCULAR | Status: AC
  Administered 2021-12-11: 50 ug via INTRAVENOUS
  Filled 2021-12-10: qty 1

## 2021-12-10 NOTE — ED Notes (Signed)
This RN called & spoke with lab re: receiving the blood that was sent down prior to orders. It will be received now. ?

## 2021-12-10 NOTE — ED Provider Notes (Signed)
? ?[facility] ?Provider Note ? ? ? Event Date/Time  ? First MD Initiated Contact with Patient 12/10/21 2253   ?  (approximate) ? ? ?History  ? ?Rectal Bleeding ? ? ?HPI ? ?Cassandra [name] is a 88 y.o. female  with medical history significant for HTN, DM, hypothyroidism, CKD 4, CAD, recently hospitalized from 1/8 to 10/05/2021 with hematochezia medically managed with colonoscopy deferred per patient preference secondary to remote complication from screening colonoscopy who presents for evaluation of recurrent bright red blood per rectum.  States that started about 2 or 3 hours prior to arrival.  She endorses some nausea and some crampy lower abdominal pain more in the right side than anywhere else.  No new headache, earache, sore throat, chest pain, vomiting, cough, shortness of breath, back pain or urinary symptoms.  She states she is only on ASA and last took her 81 mg yesterday but none today.  She did take some Tylenol earlier in the day.  No other analgesia prior to arrival.  No other acute concerns at this time ? ?  ? ? ?Physical Exam  ?Triage Vital Signs: ?ED Triage Vitals  ?Enc Vitals Group  ?   BP 12/10/21 2210 125/87  ?   Pulse Rate 12/10/21 2210 83  ?   Resp 12/10/21 2210 17  ?   Temp --   ?   Temp src --   ?   SpO2 12/10/21 2207 94 %  ?   Weight 12/10/21 2211 142 lb (64.4 kg)  ?   Height 12/10/21 2211 5' (1.524 m)  ?   Head Circumference --   ?   Peak Flow --   ?   Pain Score 12/10/21 2211 0  ?   Pain Loc --   ?   Pain Edu? --   ?   Excl. in [place]? --   ? ? ?Most recent vital signs: ?Vitals:  ? 12/11/21 0015 12/11/21 0050  ?BP: (!) 75/62 91/80  ?Pulse: 71 70  ?Resp: 17 (!) 21  ?Temp:    ?SpO2: 96% 94%  ? ? ?General: Awake, appears uncomfortable. ?CV:  Good peripheral perfusion.  2+ radial pulses. ?Resp:  Normal effort.  ?Abd:  No distention.  Mildly tender in the right lower quadrant. ?Other:  Maroon and bright red bloody stools mixed noted in the rectum. ? ? ?ED Results / Procedures /  Treatments  ?Labs ?(all labs ordered are listed, but only abnormal results are displayed) ?Labs Reviewed  ?CBC WITH DIFFERENTIAL/PLATELET - Abnormal; Notable for the following components:  ?    Result Value  ? WBC 17.3 (*)   ? RBC 5.15 (*)   ? RDW 16.9 (*)   ? Platelets 856 (*)   ? Neutro Abs 11.7 (*)   ? Monocytes Absolute 1.7 (*)   ? Abs Immature Granulocytes 0.21 (*)   ? All other components within normal limits  ?COMPREHENSIVE METABOLIC PANEL - Abnormal; Notable for the following components:  ? Glucose, Bld 157 (*)   ? BUN 35 (*)   ? Creatinine, Ser 2.67 (*)   ? Calcium 8.7 (*)   ? Albumin 2.9 (*)   ? GFR, Estimated 16 (*)   ? All other components within normal limits  ?RESP PANEL BY RT-PCR (FLU A&B, COVID) [id number]  ?PROTIME-INR  ?HEMOGLOBIN AND HEMATOCRIT, BLOOD  ?TYPE AND SCREEN  ?TROPONIN I (HIGH SENSITIVITY)  ? ? ? ?EKG ? ?EKG is remarkable sinus rhythm with a ventricular rate of 66,  left ventricular hypertrophy, nonspecific ST change in V4, V5 and V6 without other clear evidence of acute ischemia or significant arrhythmia ? ? ?RADIOLOGY ? ?CT abdomen pelvis interpreted by myself without evidence of diverticulitis, abscess or other clear acute abdominal or pelvic process.  Also reviewed radiology interpretation and agree with their findings of cholelithiasis without acute inflammatory changes and aortic atherosclerosis without any other acute abdominal or pelvic process. ? ?PROCEDURES: ? ?Critical Care performed: No ? ?.1-3 Lead EKG Interpretation ?Performed by: Lucrezia Starch, MD ?Authorized by: Lucrezia Starch, MD  ? ?  Interpretation: normal   ?  ECG rate assessment: normal   ?  Rhythm: sinus rhythm   ?  Ectopy: none   ?  Conduction: normal   ? ?The patient is on the cardiac monitor to evaluate for evidence of arrhythmia and/or significant heart rate changes. ? ? ?MEDICATIONS ORDERED IN ED: ?Medications  ?pantoprozole (PROTONIX) 80 mg /NS 100 mL infusion (8 mg/hr Intravenous New Bag/Given 12/11/21 0003)   ?pantoprazole (PROTONIX) injection 40 mg (has no administration in time range)  ?ondansetron [facility]) injection 4 mg (4 mg Intravenous Given 12/10/21 2358)  ?fentaNYL (SUBLIMAZE) injection 50 mcg (50 mcg Intravenous Given 12/11/21 0006)  ?pantoprazole (PROTONIX) 80 mg /NS 100 mL IVPB (80 mg Intravenous New Bag/Given 12/11/21 0035)  ?lactated ringers bolus 1,000 mL (1,000 mLs Intravenous New Bag/Given 12/11/21 0030)  ? ? ? ?IMPRESSION / MDM / ASSESSMENT AND PLAN / ED COURSE  ?I reviewed the triage vital signs and the nursing notes. ?             ?               ? ?Differential diagnosis includes, but is not limited to diverticular bleed, malignancy, AVM, versus other more likely lower cause.  No recent NSAIDs or EtOH use. ? ?CMP is remarkable for glucose of 157, BUN of 35, creatinine of 2.67 compared to 2.5 on 3/13 without other significant electrolyte or metabolic derangements.  INR is 1.1.  CBC shows WBC count of 17.3, hemoglobin of 13.7 and platelets of 856 compared to 719 on 3/13. ? ?Patient is amenable to blood transfusion if needed after discussion on arrival. ? ?EKG is remarkable sinus rhythm with a ventricular rate of 66, left ventricular hypertrophy, nonspecific ST change in V4, V5 and V6 without other clear evidence of acute ischemia or significant arrhythmia.  Given nonelevated troponin I have a low suspicion for ACS at this time. ? ?CT abdomen pelvis interpreted by myself without evidence of diverticulitis, abscess or other clear acute abdominal or pelvic process.  Also reviewed radiology interpretation and agree with their findings of cholelithiasis without acute inflammatory changes and aortic atherosclerosis without any other acute abdominal or pelvic process. ? ?Start patient on Protonix despite stronger suspicion at this time for a lower GI source.  We will give some IV fluids and admit to medicine service.  Type and screen sent.  I will admit to medicine service for further evaluation and management.   I discussed this with admitting hospitalist. ? ? ? ?  ? ? ?FINAL CLINICAL IMPRESSION(S) / ED DIAGNOSES  ? ?Final diagnoses:  ?Gastrointestinal hemorrhage, unspecified gastrointestinal hemorrhage type  ?Chronic kidney disease, unspecified CKD stage  ? ? ? ?Rx / DC Orders  ? ?ED Discharge Orders   ? ? None  ? ?  ? ? ? ?Note:  This document was prepared using Dragon voice recognition software and may include unintentional dictation errors. ?  ?  Lucrezia Starch, MD ?12/11/21 [phone] ? ?

## 2021-12-10 NOTE — ED Notes (Signed)
Pt on cardiac, pulse ox, BP monitors.  ? ?Red, light green, lavender, blue; gray on ice; pink x2 tubes sent to lab. ?

## 2021-12-10 NOTE — ED Triage Notes (Signed)
Pt BIB EMS from home for rectal bleeding, onset approx 2 hrs pta. Pt reports nausea, denies pain at this time. Pt reports the blood is bright red in color. Pt states she had a GI bleed just over a month ago, but did not receive blood. ?

## 2021-12-11 ENCOUNTER — Emergency Department: Payer: Medicare Other

## 2021-12-11 DIAGNOSIS — I959 Hypotension, unspecified: Secondary | ICD-10-CM | POA: Diagnosis present

## 2021-12-11 DIAGNOSIS — D72829 Elevated white blood cell count, unspecified: Secondary | ICD-10-CM | POA: Diagnosis not present

## 2021-12-11 DIAGNOSIS — E876 Hypokalemia: Secondary | ICD-10-CM | POA: Diagnosis not present

## 2021-12-11 DIAGNOSIS — E875 Hyperkalemia: Secondary | ICD-10-CM | POA: Diagnosis not present

## 2021-12-11 DIAGNOSIS — E1122 Type 2 diabetes mellitus with diabetic chronic kidney disease: Secondary | ICD-10-CM | POA: Diagnosis present

## 2021-12-11 DIAGNOSIS — Z888 Allergy status to other drugs, medicaments and biological substances status: Secondary | ICD-10-CM | POA: Diagnosis not present

## 2021-12-11 DIAGNOSIS — N184 Chronic kidney disease, stage 4 (severe): Secondary | ICD-10-CM | POA: Diagnosis present

## 2021-12-11 DIAGNOSIS — D62 Acute posthemorrhagic anemia: Secondary | ICD-10-CM | POA: Diagnosis present

## 2021-12-11 DIAGNOSIS — Z6827 Body mass index (BMI) 27.0-27.9, adult: Secondary | ICD-10-CM | POA: Diagnosis not present

## 2021-12-11 DIAGNOSIS — K921 Melena: Secondary | ICD-10-CM | POA: Diagnosis present

## 2021-12-11 DIAGNOSIS — I700 Atherosclerosis of aorta: Secondary | ICD-10-CM | POA: Diagnosis present

## 2021-12-11 DIAGNOSIS — D75838 Other thrombocytosis: Secondary | ICD-10-CM | POA: Diagnosis present

## 2021-12-11 DIAGNOSIS — N179 Acute kidney failure, unspecified: Secondary | ICD-10-CM | POA: Diagnosis not present

## 2021-12-11 DIAGNOSIS — Z8673 Personal history of transient ischemic attack (TIA), and cerebral infarction without residual deficits: Secondary | ICD-10-CM | POA: Diagnosis not present

## 2021-12-11 DIAGNOSIS — I129 Hypertensive chronic kidney disease with stage 1 through stage 4 chronic kidney disease, or unspecified chronic kidney disease: Secondary | ICD-10-CM | POA: Diagnosis present

## 2021-12-11 DIAGNOSIS — G2581 Restless legs syndrome: Secondary | ICD-10-CM | POA: Diagnosis present

## 2021-12-11 DIAGNOSIS — E039 Hypothyroidism, unspecified: Secondary | ICD-10-CM | POA: Diagnosis present

## 2021-12-11 DIAGNOSIS — K8020 Calculus of gallbladder without cholecystitis without obstruction: Secondary | ICD-10-CM | POA: Diagnosis present

## 2021-12-11 DIAGNOSIS — K922 Gastrointestinal hemorrhage, unspecified: Secondary | ICD-10-CM | POA: Insufficient documentation

## 2021-12-11 DIAGNOSIS — Z20822 Contact with and (suspected) exposure to covid-19: Secondary | ICD-10-CM | POA: Diagnosis present

## 2021-12-11 DIAGNOSIS — I2510 Atherosclerotic heart disease of native coronary artery without angina pectoris: Secondary | ICD-10-CM | POA: Diagnosis present

## 2021-12-11 DIAGNOSIS — E663 Overweight: Secondary | ICD-10-CM | POA: Diagnosis present

## 2021-12-11 DIAGNOSIS — K5791 Diverticulosis of intestine, part unspecified, without perforation or abscess with bleeding: Secondary | ICD-10-CM | POA: Diagnosis present

## 2021-12-11 DIAGNOSIS — Z885 Allergy status to narcotic agent status: Secondary | ICD-10-CM | POA: Diagnosis not present

## 2021-12-11 DIAGNOSIS — Z87891 Personal history of nicotine dependence: Secondary | ICD-10-CM | POA: Diagnosis not present

## 2021-12-11 DIAGNOSIS — Z880 Allergy status to penicillin: Secondary | ICD-10-CM | POA: Diagnosis not present

## 2021-12-11 DIAGNOSIS — I10 Essential (primary) hypertension: Secondary | ICD-10-CM | POA: Diagnosis not present

## 2021-12-11 LAB — RESP PANEL BY RT-PCR (FLU A&B, COVID) ARPGX2
Influenza A by PCR: NEGATIVE
Influenza B by PCR: NEGATIVE
SARS Coronavirus 2 by RT PCR: NEGATIVE

## 2021-12-11 LAB — HEMOGLOBIN AND HEMATOCRIT, BLOOD
HCT: 37.4 % (ref 36.0–46.0)
HCT: 32.4 % — ABNORMAL LOW (ref 36.0–46.0)
HCT: 35.6 % — ABNORMAL LOW (ref 36.0–46.0)
HCT: 30.8 % — ABNORMAL LOW (ref 36.0–46.0)
Hemoglobin: 11 g/dL — ABNORMAL LOW (ref 12.0–15.0)
Hemoglobin: 9.4 g/dL — ABNORMAL LOW (ref 12.0–15.0)
Hemoglobin: 10.4 g/dL — ABNORMAL LOW (ref 12.0–15.0)
Hemoglobin: 9.4 g/dL — ABNORMAL LOW (ref 12.0–15.0)

## 2021-12-11 LAB — TROPONIN I (HIGH SENSITIVITY): Troponin I (High Sensitivity): 17 ng/L (ref <18)

## 2021-12-11 LAB — GLUCOSE, CAPILLARY
Glucose-Capillary: 122 mg/dL — ABNORMAL HIGH (ref 70–99)
Glucose-Capillary: 97 mg/dL (ref 70–99)

## 2021-12-11 LAB — PREPARE RBC (CROSSMATCH)

## 2021-12-11 LAB — MRSA NEXT GEN BY PCR, NASAL: MRSA by PCR Next Gen: NOT DETECTED

## 2021-12-11 MED ORDER — CHLORHEXIDINE GLUCONATE CLOTH 2 % EX PADS
6.0000 | MEDICATED_PAD | Freq: Every day | CUTANEOUS | Status: DC
  Administered 2021-12-12 – 2021-12-13 (×2): 6 via TOPICAL

## 2021-12-11 MED ORDER — ACETAMINOPHEN 325 MG PO TABS
650.0000 mg | ORAL_TABLET | Freq: Four times a day (QID) | ORAL | Status: DC | PRN
Start: 2021-12-11 — End: 2021-12-16
  Filled 2021-12-11: qty 2

## 2021-12-11 MED ORDER — FENTANYL CITRATE PF 50 MCG/ML IJ SOSY
12.5000 ug | PREFILLED_SYRINGE | INTRAMUSCULAR | Status: DC | PRN
  Administered 2021-12-11 – 2021-12-12 (×2): 12.5 ug via INTRAVENOUS
  Filled 2021-12-11 (×2): qty 1

## 2021-12-11 MED ORDER — TIMOLOL MALEATE 0.5 % OP SOLN
1.0000 [drp] | Freq: Every morning | OPHTHALMIC | Status: DC
  Administered 2021-12-12 – 2021-12-15 (×4): 1 [drp] via OPHTHALMIC
  Filled 2021-12-11 (×2): qty 5

## 2021-12-11 MED ORDER — PEG 3350-KCL-NA BICARB-NACL 420 G PO SOLR
4000.0000 mL | Freq: Once | ORAL | Status: DC
  Filled 2021-12-11 (×2): qty 4000

## 2021-12-11 MED ORDER — LATANOPROST 0.005 % OP SOLN
1.0000 [drp] | Freq: Every day | OPHTHALMIC | Status: DC
  Administered 2021-12-12 – 2021-12-14 (×3): 1 [drp] via OPHTHALMIC
  Filled 2021-12-11 (×2): qty 2.5

## 2021-12-11 MED ORDER — INSULIN ASPART 100 UNIT/ML IJ SOLN
0.0000 [IU] | INTRAMUSCULAR | Status: DC
  Administered 2021-12-11: 2 [IU] via SUBCUTANEOUS
  Filled 2021-12-11 (×2): qty 1

## 2021-12-11 MED ORDER — LEVOTHYROXINE SODIUM 88 MCG PO TABS
88.0000 ug | ORAL_TABLET | Freq: Every day | ORAL | Status: DC
  Administered 2021-12-11 – 2021-12-15 (×5): 88 ug via ORAL
  Filled 2021-12-11 (×5): qty 1

## 2021-12-11 MED ORDER — ONDANSETRON HCL 4 MG PO TABS: 4.0000 mg | ORAL_TABLET | Freq: Four times a day (QID) | ORAL | Status: DC | PRN

## 2021-12-11 MED ORDER — MORPHINE SULFATE (PF) 2 MG/ML IV SOLN
2.0000 mg | INTRAVENOUS | Status: DC | PRN
  Administered 2021-12-11: 2 mg via INTRAVENOUS
  Filled 2021-12-11: qty 1

## 2021-12-11 MED ORDER — ROSUVASTATIN CALCIUM 20 MG PO TABS
20.0000 mg | ORAL_TABLET | Freq: Every day | ORAL | Status: DC
  Filled 2021-12-11: qty 1

## 2021-12-11 MED ORDER — SODIUM CHLORIDE 0.9 % IV SOLN: INTRAVENOUS | Status: DC

## 2021-12-11 MED ORDER — LEVOTHYROXINE SODIUM 88 MCG PO TABS
88.0000 ug | ORAL_TABLET | Freq: Every day | ORAL | Status: DC
  Filled 2021-12-11: qty 1

## 2021-12-11 MED ORDER — ROSUVASTATIN CALCIUM 10 MG PO TABS
20.0000 mg | ORAL_TABLET | Freq: Every evening | ORAL | Status: DC
Start: 2021-12-11 — End: 2021-12-12
  Administered 2021-12-11: 20 mg via ORAL
  Filled 2021-12-11 (×2): qty 2
  Filled 2021-12-11: qty 1

## 2021-12-11 MED ORDER — ACETAMINOPHEN 650 MG RE SUPP
650.0000 mg | Freq: Four times a day (QID) | RECTAL | Status: DC | PRN
Start: 2021-12-11 — End: 2021-12-16

## 2021-12-11 MED ORDER — ROPINIROLE HCL 1 MG PO TABS
0.5000 mg | ORAL_TABLET | Freq: Every day | ORAL | Status: DC
Start: 2021-12-11 — End: 2021-12-16
  Administered 2021-12-11 – 2021-12-14 (×4): 0.5 mg via ORAL
  Filled 2021-12-11 (×2): qty 2
  Filled 2021-12-11 (×2): qty 1

## 2021-12-11 MED ORDER — ONDANSETRON HCL 4 MG/2ML IJ SOLN
4.0000 mg | Freq: Four times a day (QID) | INTRAMUSCULAR | Status: DC | PRN
  Administered 2021-12-11: 4 mg via INTRAVENOUS
  Filled 2021-12-11: qty 2

## 2021-12-11 MED ORDER — SODIUM CHLORIDE 0.9% IV SOLUTION
Freq: Once | INTRAVENOUS | Status: AC
  Filled 2021-12-11: qty 250

## 2021-12-11 MED ORDER — LACTATED RINGERS IV BOLUS
1000.0000 mL | Freq: Once | INTRAVENOUS | Status: AC
  Administered 2021-12-11: 1000 mL via INTRAVENOUS

## 2021-12-11 NOTE — H&P (Signed)
?History and Physical  ? ? ?Patient: Cassandra [name] WCB:[id number] DOB: 1926-12-18 ?DOA: 12/10/2021 ?DOS: the patient was seen and examined on 12/11/2021 ?PCP: Rusty Aus, MD  ?Patient coming from: Home ? ?Chief Complaint:  ?Chief Complaint  ?Patient presents with  ? Rectal Bleeding  ? ? ?HPI: Cassandra [name] is a 88 y.o. female with medical history significant for HTN, DM, hypothyroidism, CKD 4, CAD,  hospitalized from 1/8 to 10/05/2021 with hematochezia with stable hemoglobin, declining colonoscopy due to prior history of colon rupture during routine colonoscopy 10 years prior,Subsequently admitted a week later from 1/18 to 1/21 with an acute CVA, discharged on aspirin, who presents to the ED with rectal bleeding, onset 2 hours prior to presentation.  She denies abdominal pain, lightheadedness, palpitations or chest pain.  On arrival, was hemodynamically stable with BP 125/87 and pulse 83 however became hypotensive to 75/62, fluid responsive to 91/80.  Pulse remained at 70.  Blood work showed normal hemoglobin of 13.7 which is her baseline.  WBC 17,000, platelets 856.  Creatinine 2.67 which is baseline for her CKD 4.  EKG, personally viewed and interpreted: Sinus at 66 with no acute ST-T wave changes.  CT abdomen and pelvis without contrast showed no acute abnormality, cholelithiasis without acute inflammation.  Patient was given an IV fluid bolus started on Protonix infusion.  Hospitalist consulted for admission.  ? ?Review of Systems: As mentioned in the history of present illness. All other systems reviewed and are negative. ?Past Medical History:  ?Diagnosis Date  ? Arthritis   ? osteoarthritis in hands, knees ( R torn cartilage, L meniscus removed)   ? CAD (coronary artery disease)   ? Diabetes mellitus without complication ([place])   ? Glaucoma   ? Hyperlipidemia   ? Hypertension   ? Hypothyroidism   ? ?Past Surgical History:  ?Procedure Laterality Date  ? ABDOMINAL HYSTERECTOMY    ? CORONARY ANGIOPLASTY WITH  STENT PLACEMENT    ? FOOT SURGERY Bilateral   ? KNEE ARTHROSCOPY W/ MENISCECTOMY Left   ? Also has R knee injury (torn cartilage)   ? ?Social History:  reports that she quit smoking about 41 years ago. Her smoking use included cigarettes. She has never used smokeless tobacco. She reports current alcohol use. She reports that she does not use drugs. ? ?Allergies  ?Allergen Reactions  ? Atorvastatin Other (See Comments)  ?  MYALGIA  ? Cyclobenzaprine Other (See Comments)  ? Hydrocodone-Acetaminophen   ?  Other reaction(s): Hallucination when taking whole pill, pt tolerates taking 1/2 pill  ? Mirtazapine   ?  Other reaction(s): Hallucination  ? Oxycodone-Acetaminophen Other (See Comments)  ? Paroxetine Hcl Other (See Comments)  ? Penicillins Swelling  ?  Lip and orbital swelling  ? Propoxyphene Other (See Comments)  ? Trazodone Other (See Comments)  ? ? ?Family History  ?Problem Relation Age of Onset  ? Hypertension Mother   ? Hypertension Father   ? Early death Father   ? Heart disease Father   ? ? ?Prior to Admission medications   ?Medication Sig Start Date End Date Taking? Authorizing Provider  ?albuterol (VENTOLIN HFA) 108 (90 Base) MCG/ACT inhaler Inhale into the lungs. 03/20/17   [provider]  ?aspirin EC 81 MG EC tablet Take 1 tablet (81 mg total) by mouth daily. Swallow whole. 10/16/21   Val Riles, MD  ?azelastine (ASTELIN) 0.1 % nasal spray Place 2 sprays into both nostrils 2 (two) times daily.    [provider]  ?  cetirizine (ZYRTEC) 10 MG tablet Take 1 tablet by mouth daily as needed for allergies.    [provider]  ?Cholecalciferol 25 MCG (1000 UT) tablet Take 1,000 Units by mouth daily. 04/26/17   [provider]  ?cloNIDine (CATAPRES) 0.1 MG tablet Take 1 tablet (0.1 mg total) by mouth 2 (two) times daily. 10/05/21 11/04/21  Sharen Hones, MD  ?DROXIA 300 MG capsule TAKE 1 CAPSULE (300 MG TOTAL) BY MOUTH DAILY. MAY TAKE WITH FOOD TO MINIMIZE GI SIDE EFFECTS. 04/23/20    Verlon Au, NP  ?gabapentin (NEURONTIN) 300 MG capsule Take 1 capsule by mouth 2 (two) times daily. 01/05/15 10/03/21  [provider]  ?glucose blood test strip Use once daily. Use as instructed. 02/21/17   [provider]  ?latanoprost (XALATAN) 0.005 % ophthalmic solution Place 1 drop into both eyes at bedtime. 09/13/15   [provider]  ?levothyroxine (SYNTHROID) 88 MCG tablet Take 88 mcg by mouth daily. Take on an empty stomach 30 to 60 minutes before breakfast 12/08/14   [provider]  ?LOTREL 5-20 MG capsule Take 1 capsule by mouth daily. 07/03/17   [provider]  ?melatonin 5 MG TABS Take 1 tablet (5 mg total) by mouth at bedtime. 10/15/21 01/13/22  Val Riles, MD  ?metoprolol succinate (TOPROL-XL) 50 MG 24 hr tablet Take 1 tablet by mouth daily. 11/02/14   [provider]  ?naloxone (NARCAN) nasal spray 4 mg/0.1 mL Place 1 spray into the nose once as needed. Place 1 spray (4 mg total) into one nostril once as needed (if not breathing.) for up to 1 dose 12/15/20   [provider]  ?pantoprazole (PROTONIX) 40 MG tablet Take 1 tablet (40 mg total) by mouth daily. 10/16/21 01/14/22  Val Riles, MD  ?polyethylene glycol (MIRALAX / GLYCOLAX) 17 g packet Take 17 g by mouth daily. 10/06/21   Sharen Hones, MD  ?rOPINIRole (REQUIP) 0.25 MG tablet Take 0.25 mg by mouth at bedtime. 04/22/21   [provider]  ?rosuvastatin (CRESTOR) 20 MG tablet Take 20 mg by mouth daily. 01/07/15   [provider]  ?timolol (TIMOPTIC-XR) 0.5 % ophthalmic gel-forming Place 1 drop into both eyes every morning. 05/06/21   [provider]  ?vitamin B-12 (CYANOCOBALAMIN) 500 MCG tablet Take 1 tablet (500 mcg total) by mouth daily. 10/16/21 01/14/22  Val Riles, MD  ?Vitamin D, Ergocalciferol, (DRISDOL) 1.25 MG (50000 UNIT) CAPS capsule Take 1 capsule (50,000 Units total) by mouth every 7 (seven) days. 10/21/21 01/19/22  Val Riles, MD  ? ? ?Physical  Exam: ?Vitals:  ? 12/10/21 2330 12/11/21 0000 12/11/21 0015 12/11/21 0050  ?BP: 120/68 (!) 95/55 (!) 75/62 91/80  ?Pulse: 71 72 71 70  ?Resp: '13 20 17 '$ (!) 21  ?Temp:  97.8 ?F (36.6 ?C)    ?TempSrc:  Oral    ?SpO2: 99% 95% 96% 94%  ?Weight:      ?Height:      ? ?Physical Exam ?Vitals and nursing note reviewed.  ?Constitutional:   ?   General: She is not in acute distress. ?   Appearance: Normal appearance.  ?HENT:  ?   Head: Normocephalic and atraumatic.  ?Cardiovascular:  ?   Rate and Rhythm: Normal rate and regular rhythm.  ?   Pulses: Normal pulses.  ?   Heart sounds: Normal heart sounds. No murmur heard. ?Pulmonary:  ?   Effort: Pulmonary effort is normal.  ?   Breath sounds: Normal breath sounds. No wheezing  or rhonchi.  ?Abdominal:  ?   General: Bowel sounds are normal.  ?   Palpations: Abdomen is soft.  ?   Tenderness: There is no abdominal tenderness.  ?Musculoskeletal:     ?   General: No swelling or tenderness.  ?   Cervical back: Normal range of motion and neck supple.  ?Skin: ?   General: Skin is warm and dry.  ?Neurological:  ?   General: No focal deficit present.  ?   Mental Status: She is alert. Mental status is at baseline.  ?Psychiatric:     ?   Mood and Affect: Mood normal.     ?   Behavior: Behavior normal.  ? ? ? ?Data Reviewed: ?Relevant notes from primary care and specialist visits, past discharge summaries as available in EHR, including Care Everywhere. ?Prior diagnostic testing as pertinent to current admission diagnoses ?Updated medications and problem lists for reconciliation ?ED course, including vitals, labs, imaging, treatment and response to treatment ?Triage notes, nursing and pharmacy notes and ED provider's notes ?Notable results as noted in HPI ? ? ?Assessment and Plan: ?* Hematochezia, recurrent ?Previous episode 2 months prior that resolved with conservative treatment ?Consider consulting GI if patient open to it.  Previously declined due to history of colon rupture during routine  colonoscopy ?Serial H&H and transfuse if necessary ?We will continue Protonix infusion started in the ED ?IV hydration ?Keep n.p.o. ? ?Hypotension ?Secondary to the above  ?IV hydration and close mo

## 2021-12-11 NOTE — Progress Notes (Signed)
Went into speak with patient regarding colonoscopy prep. Per patient she does not want to have a colonoscopy performed. Explained the importance. I messaged prime doctor and [facility]. Asked GI of they could come by and speak with patient as well. Continue to assess.  ?

## 2021-12-11 NOTE — Assessment & Plan Note (Addendum)
Hyperkalemia ? ?Baseline creatinine seems to be around 2.4.   ?Creatinine noted to be significantly on 3/20 at 4.21.  Also noted to be hyperkalemic. ?Worsening renal function likely due to combination of volume loss as well as hypotension.  UA reviewed.  Renal function is gradually improving.  Continue IV fluids.  She was given [facility] with improvement in potassium levels.   ?She is followed by Dr. Candiss Norse with nephrology.  Since renal function is improving we can hold off on nephrology consultation at this time. ?Monitor urine output. ?

## 2021-12-11 NOTE — Assessment & Plan Note (Addendum)
Continue ropinirole 

## 2021-12-11 NOTE — Assessment & Plan Note (Addendum)
Initially, secondary to GI bleed.  Antihypertensives were held.  Now hypertensive.  Medications resumed ?

## 2021-12-11 NOTE — Assessment & Plan Note (Addendum)
HbA1c 6.4 in January.  Do not see any diabetic medications in her home medication list.  Continue SSI for now. ?

## 2021-12-11 NOTE — Assessment & Plan Note (Addendum)
Initially antihypertensives were held due to hypotension.  Blood pressure now in the hypertensive range.  Likely rebound hypertension since she was on clonidine prior to admission.  Clonidine and metoprolol restarted.  Blood pressure still elevated so we will resume her Lotrel. ?Hydralazine as needed. ?

## 2021-12-11 NOTE — Assessment & Plan Note (Addendum)
Cardiac status noted to be stable.   ?Aspirin and statin on hold currently. ?

## 2021-12-11 NOTE — Consult Note (Signed)
Consultation ? ?Referring Provider:  Hospitalist ?Admit date: 3/18 ?Consult date: 3/19         ?Reason for Consultation:    Hematochezia  ?       ? HPI:   ?Cassandra [name] is a 88 y.o. lady with history of hypertension, CKD IV, DM II, hypothyroidism, JAK2 mutation with thrombocytosis and diverticulosis who presents with several episodes of hematochezia since yesterday. Her hemoglobin has fallen from initial of 13 to 9 with last bloody bowel movement 4-5 hours ago. Patient denies any precipitating factors. She presented earlier this year with presumed diverticular disease but hemoglobin was persistently normal at that time. Subsequently had CVA which she was started on aspirin for with last dose being two days ago. Patient had traumatic experience with colonoscopy over 10 years ago with perforation, partial colectomy with colostomy and subsequent reversal so she is extremely hesitant to undergo repeat colonoscopy. No NSAIDS. Patient denies any abdominal pain or upper gi symptoms. Blood pressures have been somewhat soft with systolics in the lower one hundreds. A CT in the ED showed no acute abnormalities besides right sided diverticulosis. ? ? ?Past Medical History:  ?Diagnosis Date  ? Arthritis   ? osteoarthritis in hands, knees ( R torn cartilage, L meniscus removed)   ? CAD (coronary artery disease)   ? Diabetes mellitus without complication ([place])   ? Glaucoma   ? Hyperlipidemia   ? Hypertension   ? Hypothyroidism   ? ? ?Past Surgical History:  ?Procedure Laterality Date  ? ABDOMINAL HYSTERECTOMY    ? CORONARY ANGIOPLASTY WITH STENT PLACEMENT    ? FOOT SURGERY Bilateral   ? KNEE ARTHROSCOPY W/ MENISCECTOMY Left   ? Also has R knee injury (torn cartilage)   ? ? ?Family History  ?Problem Relation Age of Onset  ? Hypertension Mother   ? Hypertension Father   ? Early death Father   ? Heart disease Father   ? ? ?Social History  ? ?Tobacco Use  ? Smoking status: Former  ?  Types: Cigarettes  ?  Quit date: 01/21/1980  ?   Years since quitting: 41.9  ? Smokeless tobacco: Never  ?Substance Use Topics  ? Alcohol use: Yes  ?  Alcohol/week: 0.0 standard drinks  ?  Comment: occasionally  ? Drug use: No  ? ? ?Prior to Admission medications   ?Medication Sig Start Date End Date Taking? Authorizing Provider  ?aspirin EC 81 MG EC tablet Take 1 tablet (81 mg total) by mouth daily. Swallow whole. 10/16/21  Yes Val Riles, MD  ?rOPINIRole (REQUIP) 0.5 MG tablet Take 0.5 mg by mouth at bedtime. 11/16/21  Yes [provider]  ?albuterol (VENTOLIN HFA) 108 (90 Base) MCG/ACT inhaler Inhale into the lungs. 03/20/17   [provider]  ?azelastine (ASTELIN) 0.1 % nasal spray Place 2 sprays into both nostrils 2 (two) times daily.    [provider]  ?cetirizine (ZYRTEC) 10 MG tablet Take 1 tablet by mouth daily as needed for allergies.    [provider]  ?Cholecalciferol 25 MCG (1000 UT) tablet Take 1,000 Units by mouth daily. 04/26/17   [provider]  ?cloNIDine (CATAPRES) 0.1 MG tablet Take 1 tablet (0.1 mg total) by mouth 2 (two) times daily. 10/05/21 11/04/21  Sharen Hones, MD  ?DROXIA 300 MG capsule TAKE 1 CAPSULE (300 MG TOTAL) BY MOUTH DAILY. MAY TAKE WITH FOOD TO MINIMIZE GI SIDE EFFECTS. 04/23/20   Verlon Au, NP  ?gabapentin (NEURONTIN) 300 MG capsule Take  1 capsule by mouth 2 (two) times daily. 01/05/15 10/03/21  [provider]  ?glucose blood test strip Use once daily. Use as instructed. 02/21/17   [provider]  ?latanoprost (XALATAN) 0.005 % ophthalmic solution Place 1 drop into both eyes at bedtime. 09/13/15   [provider]  ?levothyroxine (SYNTHROID) 88 MCG tablet Take 88 mcg by mouth daily. Take on an empty stomach 30 to 60 minutes before breakfast 12/08/14   [provider]  ?LOTREL 5-20 MG capsule Take 1 capsule by mouth daily. 07/03/17   [provider]  ?melatonin 5 MG TABS Take 1 tablet (5 mg total) by mouth at bedtime. 10/15/21 01/13/22   Val Riles, MD  ?metoprolol succinate (TOPROL-XL) 50 MG 24 hr tablet Take 1 tablet by mouth daily. 11/02/14   [provider]  ?naloxone (NARCAN) nasal spray 4 mg/0.1 mL Place 1 spray into the nose once as needed. Place 1 spray (4 mg total) into one nostril once as needed (if not breathing.) for up to 1 dose 12/15/20   [provider]  ?pantoprazole (PROTONIX) 40 MG tablet Take 1 tablet (40 mg total) by mouth daily. 10/16/21 01/14/22  Val Riles, MD  ?polyethylene glycol (MIRALAX / GLYCOLAX) 17 g packet Take 17 g by mouth daily. 10/06/21   Sharen Hones, MD  ?rosuvastatin (CRESTOR) 20 MG tablet Take 20 mg by mouth daily. 01/07/15   [provider]  ?timolol (TIMOPTIC-XR) 0.5 % ophthalmic gel-forming Place 1 drop into both eyes every morning. 05/06/21   [provider]  ?vitamin B-12 (CYANOCOBALAMIN) 500 MCG tablet Take 1 tablet (500 mcg total) by mouth daily. 10/16/21 01/14/22  Val Riles, MD  ?Vitamin D, Ergocalciferol, (DRISDOL) 1.25 MG (50000 UNIT) CAPS capsule Take 1 capsule (50,000 Units total) by mouth every 7 (seven) days. 10/21/21 01/19/22  Val Riles, MD  ? ? ?Current Facility-Administered Medications  ?Medication Dose Route Frequency Provider Last Rate Last Admin  ? 0.9 %  sodium chloride infusion   Intravenous Continuous Bonnielee Haff, MD 100 mL/hr at 12/11/21 0934 New Bag at 12/11/21 0934  ? acetaminophen (TYLENOL) tablet 650 mg  650 mg Oral Q6H PRN Athena Masse, MD      ? Or  ? acetaminophen (TYLENOL) suppository 650 mg  650 mg Rectal Q6H PRN Athena Masse, MD      ? fentaNYL (SUBLIMAZE) injection 12.5 mcg  12.5 mcg Intravenous Q2H PRN Sharion Settler, NP   12.5 mcg at 12/11/21 6378  ? insulin aspart (novoLOG) injection 0-15 Units  0-15 Units Subcutaneous Q4H Athena Masse, MD      ? latanoprost (XALATAN) 0.005 % ophthalmic solution 1 drop  1 drop Both Eyes QHS Bonnielee Haff, MD      ? levothyroxine (SYNTHROID) tablet 88 mcg  88 mcg Oral Daily Bonnielee Haff, MD      ? ondansetron [facility]) tablet 4 mg  4 mg Oral Q6H PRN Athena Masse, MD      ? Or  ? ondansetron [facility]) injection 4 mg  4 mg Intravenous Q6H PRN Athena Masse, MD   4 mg at 12/11/21 5885  ? [START ON 12/14/2021] pantoprazole (PROTONIX) injection 40 mg  40 mg Intravenous Q12H Lucrezia Starch, MD      ? pantoprozole (PROTONIX) 80 mg /NS 100 mL infusion  8 mg/hr Intravenous Continuous Lucrezia Starch, MD 10 mL/hr at 12/11/21 0837 8 mg/hr at 12/11/21 0837  ? rOPINIRole (REQUIP) tablet 0.5 mg  0.5 mg Oral  Paula Compton, MD      ? rosuvastatin (CRESTOR) tablet 20 mg  20 mg Oral Daily Bonnielee Haff, MD      ? timolol (TIMOPTIC-XR) 0.5 % ophthalmic gel-forming 1 drop  1 drop Both Eyes q morning Bonnielee Haff, MD      ? ?Current Outpatient Medications  ?Medication Sig Dispense Refill  ? aspirin EC 81 MG EC tablet Take 1 tablet (81 mg total) by mouth daily. Swallow whole. 30 tablet 11  ? rOPINIRole (REQUIP) 0.5 MG tablet Take 0.5 mg by mouth at bedtime.    ? albuterol (VENTOLIN HFA) 108 (90 Base) MCG/ACT inhaler Inhale into the lungs.    ? azelastine (ASTELIN) 0.1 % nasal spray Place 2 sprays into both nostrils 2 (two) times daily.    ? cetirizine (ZYRTEC) 10 MG tablet Take 1 tablet by mouth daily as needed for allergies.    ? Cholecalciferol 25 MCG (1000 UT) tablet Take 1,000 Units by mouth daily.    ? cloNIDine (CATAPRES) 0.1 MG tablet Take 1 tablet (0.1 mg total) by mouth 2 (two) times daily. 60 tablet 0  ? DROXIA 300 MG capsule TAKE 1 CAPSULE (300 MG TOTAL) BY MOUTH DAILY. MAY TAKE WITH FOOD TO MINIMIZE GI SIDE EFFECTS. 30 capsule 0  ? gabapentin (NEURONTIN) 300 MG capsule Take 1 capsule by mouth 2 (two) times daily.    ? glucose blood test strip Use once daily. Use as instructed.    ? latanoprost (XALATAN) 0.005 % ophthalmic solution Place 1 drop into both eyes at bedtime.    ? levothyroxine (SYNTHROID) 88 MCG tablet Take 88 mcg by mouth daily. Take on an empty stomach 30 to 60 minutes  before breakfast    ? LOTREL 5-20 MG capsule Take 1 capsule by mouth daily.    ? melatonin 5 MG TABS Take 1 tablet (5 mg total) by mouth at bedtime. 90 tablet 0  ? metoprolol succinate (TOPROL-XL) 50 MG 24 hr t

## 2021-12-11 NOTE — Progress Notes (Signed)
[facility] PROGRESS NOTE   Cassandra [name] YQM:[id number] DOB: [birth year] DOA: 12/10/2021  0 DOS: the patient was seen and examined on 12/11/2021  PCP: Danella Penton, MD  Brief History and Hospital Course:  88 y.o. female with medical history significant for HTN, DM, hypothyroidism, CKD 4, CAD,  hospitalized from 1/8 to 10/05/2021 with hematochezia with stable hemoglobin, declining colonoscopy due to prior history of colon rupture during routine colonoscopy 10 years prior. Subsequently admitted a week later from 1/18 to 1/21 with an acute CVA, discharged on aspirin, who presents to the ED with rectal bleeding.  Patient subsequently experienced a significant drop in hemoglobin.  She was noted to be hypotensive.  She was ordered blood transfusion.  Hospitalized for further management.   Consultants: Gastroenterology  Procedures: None yet    Subjective: Patient mentioned that she passed some blood from her rectum about an hour ago.  Complains of feeling weak but denies any chest pain shortness of breath.  No lightheadedness.  Denies any abdominal pain per se.    Assessment/Plan:   * Hematochezia, recurrent Her previous episode was 2 months ago which resolved with conservative management.  At that time she did not have any significant drop in her hemoglobin. Patient denies any history of melanotic stools.  No history of GERD.  Does not take NSAIDs. This appears to be purely a lower GI bleed.  Possibly diverticular. Continue PPI for now Patient still seems somewhat reluctant to undergo endoscopy.  Gastroenterology has been consulted and we await their input.  May need to consider tagged RBC study.  Hypotension Secondary to GI bleed.  Asymptomatic for the most part.  Holding her antihypertensives.  Monitor blood pressures closely.  IV fluids and blood transfusion should help.    Acute blood loss anemia Significant drop in hemoglobin noted from 13.7 to 9.4.  Being transfused  PRBC.  Continue to monitor.  CKD (chronic kidney disease) stage 4, GFR 15-29 ml/min (HCC) Acute kidney injury  Baseline creatinine seems to be around 2.4.  Slightly worsened this admission likely due to volume loss.  Continue with IV fluids.  Monitor urine output.  Recheck labs tomorrow.    Recent cerebrovascular accident 10/12/2021 (CVA) Diagnosed with acute stroke January.  She had left-sided weakness at that time.  Placed on aspirin.  Currently on hold due to GI bleed.  Also noted to be on statin.   Diabetes mellitus, type II (HCC) HbA1c 6.4 in January.  Do not see any diabetic medications in her home medication list.  Continue SSI for now.  CAD (coronary artery disease), native coronary artery Cardiac status noted to be stable.  Metoprolol on hold due to hypotension.  Will hold aspirin due to GI bleed.  Continue statin.  Essential hypertension Holding antihypertensives including clonidine, metoprolol and Lotrel.    Acquired hypothyroidism Continue levothyroxine  Thrombocytosis Patient with history of JAK2 gene mutation and thrombocytosis.  Followed by oncology.  On Droxia for the same.  Continue to monitor  Restless legs syndrome Continue ropinirole and gabapentin  Cholelithiasis Incidentally noted on CT scan.  LFTs are normal.  Abdomen is benign.  Continue to monitor.     DVT Prophylaxis: SCDs Code Status: Full code Family Communication: Discussed with patient.  No family at bedside Disposition Plan: Hopefully return home when improved  Status is: Inpatient Remains inpatient appropriate because: Lower GI bleed     Medications: Scheduled:  insulin aspart  0-15 Units Subcutaneous Q4H   latanoprost  1 drop Both  Eyes QHS   levothyroxine  88 mcg Oral Daily   [START ON 12/14/2021] pantoprazole  40 mg Intravenous Q12H   rOPINIRole  0.5 mg Oral QHS   rosuvastatin  20 mg Oral Daily   timolol  1 drop Both Eyes q morning   Continuous:  sodium chloride     pantoprazole  8 mg/hr (12/11/21 0837)   AVW:[id number] **OR** acetaminophen, fentaNYL (SUBLIMAZE) injection, ondansetron **OR** ondansetron (ZOFRAN) IV  Antibiotics: Anti-infectives (From admission, onward)    None       Objective:  Vital Signs  Vitals:   12/11/21 0750 12/11/21 0800 12/11/21 0815 12/11/21 0830  BP: (!) 91/53 (!) 96/57 112/85 133/82  Pulse: 82 82 82 82  Resp: 20 (!) 24 20 (!) 21  Temp:      TempSrc:      SpO2: 95% 94% 99% 94%  Weight:      Height:        Intake/Output Summary (Last 24 hours) at 12/11/2021 0854 Last data filed at 12/11/2021 0842 Gross per 24 hour  Intake 271 ml  Output --  Net 271 ml   Filed Weights   12/10/21 2211  Weight: 64.4 kg    General appearance: Awake alert.  In no distress Resp: Clear to auscultation bilaterally.  Normal effort Cardio: S1-S2 is normal regular.  No S3-S4.  No rubs murmurs or bruit GI: Abdomen is soft.  Nontender nondistended.  Bowel sounds are present normal.  No masses organomegaly Extremities: No edema.  Full range of motion of lower extremities. Neurologic:   No focal neurological deficits.    Lab Results:  Data Reviewed: I have personally reviewed labs and imaging study reports  CBC: Recent Labs  Lab 12/10/21 2213 12/11/21 0108 12/11/21 0519  WBC 17.3*  --   --   NEUTROABS 11.7*  --   --   HGB 13.7 11.0* 9.4*  HCT 45.7 37.4 32.4*  MCV 88.7  --   --   PLT 856*  --   --     Basic Metabolic Panel: Recent Labs  Lab 12/10/21 2213  NA 139  K 5.0  CL 111  CO2 22  GLUCOSE 157*  BUN 35*  CREATININE 2.67*  CALCIUM 8.7*    GFR: Estimated Creatinine Clearance: 11.3 mL/min (A) (by C-G formula based on SCr of 2.67 mg/dL (H)).  Liver Function Tests: Recent Labs  Lab 12/10/21 2213  AST 27  ALT 9  ALKPHOS 47  BILITOT 0.7  PROT 7.3  ALBUMIN 2.9*     Coagulation Profile: Recent Labs  Lab 12/10/21 2213  INR 1.1     Recent Results (from the past 240 hour(s))  Resp Panel by RT-PCR (Flu  A&B, Covid) Nasopharyngeal Swab     Status: None   Collection Time: 12/11/21 12:26 AM   Specimen: Nasopharyngeal Swab; Nasopharyngeal(NP) swabs in vial transport medium  Result Value Ref Range Status   SARS Coronavirus 2 by RT PCR NEGATIVE NEGATIVE Final    Comment: (NOTE) SARS-CoV-2 target nucleic acids are NOT DETECTED.  The SARS-CoV-2 RNA is generally detectable in upper respiratory specimens during the acute phase of infection. The lowest concentration of SARS-CoV-2 viral copies this assay can detect is 138 copies/mL. A negative result does not preclude SARS-Cov-2 infection and should not be used as the sole basis for treatment or other patient management decisions. A negative result may occur with  improper specimen collection/handling, submission of specimen other than nasopharyngeal swab, presence of viral mutation(s) within the areas  targeted by this assay, and inadequate number of viral copies(<138 copies/mL). A negative result must be combined with clinical observations, patient history, and epidemiological information. The expected result is Negative.  Fact Sheet for Patients:  BloggerCourse.com  Fact Sheet for Healthcare Providers:  SeriousBroker.it  This test is no t yet approved or cleared by the [place] FDA and  has been authorized for detection and/or diagnosis of SARS-CoV-2 by FDA under an Emergency Use Authorization (EUA). This EUA will remain  in effect (meaning this test can be used) for the duration of the COVID-19 declaration under Section 564(b)(1) of the Act, 21 U.S.C.section 360bbb-3(b)(1), unless the authorization is terminated  or revoked sooner.       Influenza A by PCR NEGATIVE NEGATIVE Final   Influenza B by PCR NEGATIVE NEGATIVE Final    Comment: (NOTE) The Xpert Xpress SARS-CoV-2/FLU/RSV plus assay is intended as an aid in the diagnosis of influenza from Nasopharyngeal swab specimens  and should not be used as a sole basis for treatment. Nasal washings and aspirates are unacceptable for Xpert Xpress SARS-CoV-2/FLU/RSV testing.  Fact Sheet for Patients: BloggerCourse.com  Fact Sheet for Healthcare Providers: SeriousBroker.it  This test is not yet approved or cleared by the [place] FDA and has been authorized for detection and/or diagnosis of SARS-CoV-2 by FDA under an Emergency Use Authorization (EUA). This EUA will remain in effect (meaning this test can be used) for the duration of the COVID-19 declaration under Section 564(b)(1) of the Act, 21 U.S.C. section 360bbb-3(b)(1), unless the authorization is terminated or revoked.  Performed at [facility], [address] [place], [place] [address]       Radiology Studies: CT ABDOMEN PELVIS WO CONTRAST  Result Date: 12/11/2021 CLINICAL DATA:  Abdominal pain EXAM: CT ABDOMEN AND PELVIS WITHOUT CONTRAST TECHNIQUE: Multidetector CT imaging of the abdomen and pelvis was performed following the standard protocol without IV contrast. RADIATION DOSE REDUCTION: This exam was performed according to the departmental dose-optimization program which includes automated exposure control, adjustment of the mA and/or kV according to patient size and/or use of iterative reconstruction technique. COMPARISON:  10/02/2021 FINDINGS: Lower Chest: Unchanged 6 mm nodule at the right lung base. Hepatobiliary: Normal hepatic contours. No intra- or extrahepatic biliary dilatation. There is cholelithiasis without acute inflammation. Pancreas: Normal pancreas. No ductal dilatation or peripancreatic fluid collection. Spleen: Normal. Adrenals/Urinary Tract: The adrenal glands are normal. 2.5 cm simple left renal cyst. No hydronephrosis. No nephroureterolithiasis. Renal atrophy. The urinary bladder is normal for degree of distention Stomach/Bowel: There is no hiatal hernia. Normal duodenal  course and caliber. No small bowel dilatation or inflammation. Ascending colonic diverticulosis without acute inflammation. Rectosigmoid anastomosis. Vascular/Lymphatic: There is calcific atherosclerosis of the abdominal aorta. No lymphadenopathy. Reproductive: Status post hysterectomy. No adnexal mass. Other: None. Musculoskeletal: No bony spinal canal stenosis or focal osseous abnormality. IMPRESSION: 1. No acute abnormality of the abdomen or pelvis. 2. Cholelithiasis without acute inflammation. Aortic Atherosclerosis (ICD10-I70.0). Electronically Signed   By: Deatra Robinson M.D.   On: 12/11/2021 00:59       LOS: 0 days   Cassandra [name] Rito Ehrlich  [facility] Pager on [email or web address]  12/11/2021, 8:54 AM

## 2021-12-11 NOTE — ED Notes (Signed)
Message sent to pharmacy for replacement bag of protonix ?

## 2021-12-11 NOTE — Assessment & Plan Note (Addendum)
Patient with history of JAK2 gene mutation and thrombocytosis.  Followed by oncology.  On Droxia for the same which is currently on hold.  Continue to monitor.  Platelet count in the 400s. ?

## 2021-12-11 NOTE — Assessment & Plan Note (Signed)
Continue levothyroxine 

## 2021-12-11 NOTE — Assessment & Plan Note (Addendum)
Her previous episode of hematochezia was 2 months ago which resolved with conservative management.  At that time she did not have any significant drop in her hemoglobin.  Patient denies any history of melanotic stools.  No history of GERD.  Does not take NSAIDs other than her daily aspirin.  Suspect that this is most likely a diverticular bleed. ? ?Initially placed on PPI.  Once patient declined colonoscopy finally, gastroenterology signed off.Tagged RBC nuclear medicine scan negative for active bleeding.  With fluids improving her renal function back to baseline, she did have some hemoconcentration with hemoglobin down to 7.7 on 3/23.  She received a unit of blood for this.  I suspect following diverticular bleed, with correction of her renal function for hemoconcentration, her hemoglobin likely would have been around 7.  No further episodes of bleeding. ?

## 2021-12-11 NOTE — Assessment & Plan Note (Addendum)
Significant drop in hemoglobin noted from 13.7 to 9.4.  She was transfused 1 unit of PRBC.  Hemoglobin between 8 and 9.  As above, with IV fluids and renal function getting much closer to her baseline, hemoglobin down to 7.7.  From hemoconcentration.  Received 1 additional unit of blood on 3/23. ?

## 2021-12-11 NOTE — ED Notes (Signed)
Secure chat sent to pharmacy for bag of protonix ?

## 2021-12-11 NOTE — Progress Notes (Signed)
Cross Cover ?Patient with 4 gm drop in HGB in setting of active GI bleed. One  unit PRBC transfusion ordered after discussion with Dr Damita Dunnings.  Pain med changed from morphine to fentanyl and morphine has been ineffective for relieving her abdominal pain ?

## 2021-12-11 NOTE — ED Notes (Signed)
Pt had a soiled brief filled with blood and poop on when writer and NT Emilee came into the room. Pt was changed into new gown and brief. Pt's linens are changed and clean. RN was notified. ?

## 2021-12-11 NOTE — ED Notes (Signed)
Pt given two warm blankets.  

## 2021-12-11 NOTE — ED Notes (Signed)
Pt given ice chips

## 2021-12-11 NOTE — Assessment & Plan Note (Signed)
Incidentally noted on CT scan.  LFTs are normal.  Abdomen is benign.  Continue to monitor. ?

## 2021-12-11 NOTE — Hospital Course (Addendum)
88 y.o. female with medical history significant for HTN, DM, hypothyroidism, CKD 4, CAD,  hospitalized from 1/8 to 10/05/2021 with hematochezia with stable hemoglobin, declining colonoscopy due to prior history of colon rupture during routine colonoscopy 10 years prior. Subsequently admitted a week later from 1/18 to 1/21 with an acute CVA, discharged on aspirin, who presents to the ED with rectal bleeding.  Patient subsequently experienced a significant drop in hemoglobin.  She was noted to be hypotensive.  She was ordered blood transfusion.  Hospitalized for further management.  Gastroenterology was consulted.  Nuclear medicine RBC scan was negative.  Patient remained undecided regarding colonoscopy and eventually agreed to proceed, but then refused again on 3/21.  By 3/23, patient felt to be stable with renal function close to baseline. ?

## 2021-12-11 NOTE — Assessment & Plan Note (Addendum)
Diagnosed with acute stroke in January 2023.  She had left-sided weakness at that time.  Placed on aspirin.  Currently on hold due to GI bleed.  Statin is currently on hold.  Should be able to resume once renal function gets back to baseline ?

## 2021-12-11 NOTE — ED Notes (Signed)
Pt pump beeping- restarted protonix and noted to be almost empty- this Rn had asked for replacement bag around 1028- no new bag noted in tube station or in medication drawers ?

## 2021-12-12 ENCOUNTER — Other Ambulatory Visit: Payer: Self-pay | Admitting: Radiology

## 2021-12-12 ENCOUNTER — Inpatient Hospital Stay: Payer: Medicare Other

## 2021-12-12 ENCOUNTER — Other Ambulatory Visit: Payer: Medicare Other

## 2021-12-12 ENCOUNTER — Encounter: Payer: Self-pay | Admitting: Internal Medicine

## 2021-12-12 DIAGNOSIS — N179 Acute kidney failure, unspecified: Secondary | ICD-10-CM

## 2021-12-12 DIAGNOSIS — E875 Hyperkalemia: Secondary | ICD-10-CM | POA: Diagnosis not present

## 2021-12-12 DIAGNOSIS — D62 Acute posthemorrhagic anemia: Secondary | ICD-10-CM | POA: Diagnosis not present

## 2021-12-12 DIAGNOSIS — K921 Melena: Secondary | ICD-10-CM | POA: Diagnosis not present

## 2021-12-12 LAB — BASIC METABOLIC PANEL
Anion gap: 6 (ref 5–15)
Anion gap: 9 (ref 5–15)
BUN: 43 mg/dL — ABNORMAL HIGH (ref 8–23)
BUN: 40 mg/dL — ABNORMAL HIGH (ref 8–23)
CO2: 18 mmol/L — ABNORMAL LOW (ref 22–32)
CO2: 18 mmol/L — ABNORMAL LOW (ref 22–32)
Calcium: 7.3 mg/dL — ABNORMAL LOW (ref 8.9–10.3)
Calcium: 7.4 mg/dL — ABNORMAL LOW (ref 8.9–10.3)
Chloride: 116 mmol/L — ABNORMAL HIGH (ref 98–111)
Chloride: 115 mmol/L — ABNORMAL HIGH (ref 98–111)
Creatinine, Ser: 4.21 mg/dL — ABNORMAL HIGH (ref 0.44–1.00)
Creatinine, Ser: 3.93 mg/dL — ABNORMAL HIGH (ref 0.44–1.00)
GFR, Estimated: 9 mL/min — ABNORMAL LOW (ref >60)
GFR, Estimated: 10 mL/min — ABNORMAL LOW (ref >60)
Glucose, Bld: 86 mg/dL (ref 70–99)
Glucose, Bld: 75 mg/dL (ref 70–99)
Potassium: 6.2 mmol/L — ABNORMAL HIGH (ref 3.5–5.1)
Potassium: 5.7 mmol/L — ABNORMAL HIGH (ref 3.5–5.1)
Sodium: 140 mmol/L (ref 135–145)
Sodium: 142 mmol/L (ref 135–145)

## 2021-12-12 LAB — URINALYSIS, COMPLETE (UACMP) WITH MICROSCOPIC
Bilirubin Urine: NEGATIVE
Glucose, UA: 50 mg/dL — AB
Ketones, ur: NEGATIVE mg/dL
Nitrite: NEGATIVE
Protein, ur: 100 mg/dL — AB
Specific Gravity, Urine: 1.005 (ref 1.005–1.030)
pH: 7 (ref 5.0–8.0)

## 2021-12-12 LAB — BPAM RBC
Blood Product Expiration Date: 202304192359
ISSUE DATE / TIME: 202303190642
Unit Type and Rh: 5100

## 2021-12-12 LAB — CBC
HCT: 29.4 % — ABNORMAL LOW (ref 36.0–46.0)
HCT: 29.2 % — ABNORMAL LOW (ref 36.0–46.0)
Hemoglobin: 8.9 g/dL — ABNORMAL LOW (ref 12.0–15.0)
Hemoglobin: 8.8 g/dL — ABNORMAL LOW (ref 12.0–15.0)
MCH: 28 pg (ref 26.0–34.0)
MCH: 28.5 pg (ref 26.0–34.0)
MCHC: 30.3 g/dL (ref 30.0–36.0)
MCHC: 30.1 g/dL (ref 30.0–36.0)
MCV: 92.5 fL (ref 80.0–100.0)
MCV: 94.5 fL (ref 80.0–100.0)
Platelets: 484 10*3/uL — ABNORMAL HIGH (ref 150–400)
Platelets: 484 10*3/uL — ABNORMAL HIGH (ref 150–400)
RBC: 3.18 MIL/uL — ABNORMAL LOW (ref 3.87–5.11)
RBC: 3.09 MIL/uL — ABNORMAL LOW (ref 3.87–5.11)
RDW: 16.5 % — ABNORMAL HIGH (ref 11.5–15.5)
RDW: 17.1 % — ABNORMAL HIGH (ref 11.5–15.5)
WBC: 23.6 10*3/uL — ABNORMAL HIGH (ref 4.0–10.5)
WBC: 22.8 10*3/uL — ABNORMAL HIGH (ref 4.0–10.5)
nRBC: 0.3 % — ABNORMAL HIGH (ref 0.0–0.2)
nRBC: 0.3 % — ABNORMAL HIGH (ref 0.0–0.2)

## 2021-12-12 LAB — TYPE AND SCREEN
ABO/RH(D): O POS
Antibody Screen: NEGATIVE
Unit division: 0

## 2021-12-12 LAB — GLUCOSE, CAPILLARY
Glucose-Capillary: 73 mg/dL (ref 70–99)
Glucose-Capillary: 74 mg/dL (ref 70–99)
Glucose-Capillary: 81 mg/dL (ref 70–99)
Glucose-Capillary: 85 mg/dL (ref 70–99)
Glucose-Capillary: 88 mg/dL (ref 70–99)
Glucose-Capillary: 91 mg/dL (ref 70–99)

## 2021-12-12 MED ORDER — SODIUM ZIRCONIUM CYCLOSILICATE 5 G PO PACK
10.0000 g | PACK | ORAL | Status: DC
  Administered 2021-12-12: 10 g via ORAL
  Filled 2021-12-12 (×2): qty 2

## 2021-12-12 MED ORDER — SODIUM CHLORIDE 0.9 % IV SOLN
INTRAVENOUS | Status: DC
Start: 2021-12-12 — End: 2021-12-12

## 2021-12-12 MED ORDER — HYDRALAZINE HCL 20 MG/ML IJ SOLN
10.0000 mg | Freq: Four times a day (QID) | INTRAMUSCULAR | Status: DC | PRN
  Administered 2021-12-12 – 2021-12-14 (×2): 10 mg via INTRAVENOUS
  Filled 2021-12-12 (×2): qty 1

## 2021-12-12 MED ORDER — SODIUM CHLORIDE 0.45 % IV SOLN: INTRAVENOUS | Status: DC

## 2021-12-12 MED ORDER — TECHNETIUM TC 99M-LABELED RED BLOOD CELLS IV KIT
20.0000 | PACK | Freq: Once | INTRAVENOUS | Status: AC | PRN
Start: 2021-12-12 — End: 2021-12-12
  Administered 2021-12-12: 21.73 via INTRAVENOUS

## 2021-12-12 MED ORDER — SODIUM ZIRCONIUM CYCLOSILICATE 5 G PO PACK
10.0000 g | PACK | Freq: Two times a day (BID) | ORAL | Status: AC
  Administered 2021-12-12 (×2): 10 g via ORAL
  Filled 2021-12-12: qty 2

## 2021-12-12 MED ORDER — LORAZEPAM 2 MG/ML IJ SOLN
0.5000 mg | Freq: Once | INTRAMUSCULAR | Status: AC
  Administered 2021-12-12: 0.5 mg via INTRAVENOUS
  Filled 2021-12-12: qty 1

## 2021-12-12 MED ORDER — PEG 3350-KCL-NA BICARB-NACL 420 G PO SOLR
4000.0000 mL | Freq: Once | ORAL | Status: AC
  Administered 2021-12-12: 4000 mL via ORAL
  Filled 2021-12-12: qty 4000

## 2021-12-12 NOTE — Plan of Care (Signed)
Patient remains NPO except chips and meds. Confirmed with Dr. [place] Pink that pt needs to remain NPO. Tolerates ice and meds well. Patient has agreed to colonoscopy, and per GI will be added onto schedule tomorrow. Pt also undergoing RBC scan today. Was given ativan for anxiety while in procedure area. Denies pain. No stools thus far this shift.  ? ?Problem: Education: ?Goal: Knowledge of General Education information will improve ?Description: Including pain rating scale, medication(s)/side effects and non-pharmacologic comfort measures ?Outcome: Progressing ?  ?Problem: Health Behavior/Discharge Planning: ?Goal: Ability to manage health-related needs will improve ?Outcome: Progressing ?  ?Problem: Clinical Measurements: ?Goal: Ability to maintain clinical measurements within normal limits will improve ?Outcome: Progressing ?Goal: Will remain free from infection ?Outcome: Progressing ?Goal: Diagnostic test results will improve ?Outcome: Progressing ?Goal: Respiratory complications will improve ?Outcome: Progressing ?Goal: Cardiovascular complication will be avoided ?Outcome: Progressing ?  ?Problem: Activity: ?Goal: Risk for activity intolerance will decrease ?Outcome: Progressing ?  ?Problem: Nutrition: ?Goal: Adequate nutrition will be maintained ?Outcome: Not Progressing ?  ?Problem: Coping: ?Goal: Level of anxiety will decrease ?Outcome: Progressing ?  ?Problem: Elimination: ?Goal: Will not experience complications related to bowel motility ?Outcome: Progressing ?Goal: Will not experience complications related to urinary retention ?Outcome: Progressing ?  ?Problem: Pain Managment: ?Goal: General experience of comfort will improve ?Outcome: Progressing ?  ?Problem: Safety: ?Goal: Ability to remain free from injury will improve ?Outcome: Progressing ?  ?Problem: Skin Integrity: ?Goal: Risk for impaired skin integrity will decrease ?Outcome: Progressing ?  ?

## 2021-12-12 NOTE — Progress Notes (Addendum)
? ?[facility] ?PROGRESS NOTE ? ? ?Cassandra [name] IWP:[id number] DOB: August 08, 1927 DOA: 12/10/2021  1 ?DOS: the patient was seen and examined on 12/12/2021 ? ?PCP: Rusty Aus, MD ? ?Brief History and Hospital Course:  ?88 y.o. female with medical history significant for HTN, DM, hypothyroidism, CKD 4, CAD,  hospitalized from 1/8 to 10/05/2021 with hematochezia with stable hemoglobin, declining colonoscopy due to prior history of colon rupture during routine colonoscopy 10 years prior. Subsequently admitted a week later from 1/18 to 1/21 with an acute CVA, discharged on aspirin, who presents to the ED with rectal bleeding.  Patient subsequently experienced a significant drop in hemoglobin.  She was noted to be hypotensive.  She was ordered blood transfusion.  Hospitalized for further management.  Patient continues to be hesitant about undergoing colonoscopy. ? ?Consultants: Gastroenterology ? ?Procedures: None yet ? ? ? ?Subjective: ?Patient mentioned that she had a bowel movement this morning.  Discussed with nursing staff.  There was some blood clots in the stool.  Denies any abdominal pain nausea or vomiting.  Continues to be hesitant about undergoing colonoscopy.  Agreeable to nuclear medicine scan.  Has been urinating. ? ? ?Assessment/Plan: ? ? ?* Hematochezia, recurrent ?Her previous episode of hematochezia was 2 months ago which resolved with conservative management.  At that time she did not have any significant drop in her hemoglobin. ?Patient denies any history of melanotic stools.  No history of GERD.  Does not take NSAIDs. ?This is most likely a diverticular bleed  ?Continue PPI for now.  Changed to twice a day instead of infusion. ?Patient still seems somewhat reluctant to undergo endoscopy.  ?Gastroenterology following.  Tagged RBC nuclear medicine scan has been ordered.  ?Had a bowel movement overnight with some blood clots.  Slight drop in hemoglobin noted. ? ?Hypotension ?Secondary to GI  bleed.  Asymptomatic for the most part.   ?Antihypertensives were placed on hold.  Blood pressure has stabilized. ? ?Acute blood loss anemia ?Significant drop in hemoglobin noted from 13.7 to 9.4.  She was transfused 1 unit of PRBC.  Hemoglobin 8.9 this morning.  Continue to monitor.  Recheck later today.   ? ?CKD (chronic kidney disease) stage 4, GFR 15-29 ml/min (HCC) ?Acute kidney injury ?Hyperkalemia ? ?Baseline creatinine seems to be around 2.4.   ?Creatinine noted to be significantly worse this morning at 4.21.  She also has hyperkalemia.  Will order EKG to check for changes.  We will give her Lokelma.  Worsening renal function likely due to combination of volume loss as well as hypotension.  Check UA.  She has been making urine.  She is followed by Dr. Candiss Norse with nephrology.  We will recheck basic metabolic panel this afternoon.  Continue IV fluids.  If it continues to worsen then may involve nephrology.   ? ?Recent cerebrovascular accident 10/12/2021 (CVA) ?Diagnosed with acute stroke in January 2023.  She had left-sided weakness at that time.  Placed on aspirin.  Currently on hold due to GI bleed.  Also noted to be on statin.  ? ?Diabetes mellitus, type II ([place]) ?HbA1c 6.4 in January.  Do not see any diabetic medications in her home medication list.  Continue SSI for now. ? ?CAD (coronary artery disease), native coronary artery ?Cardiac status noted to be stable.  Metoprolol on hold due to hypotension.   ?Statin placed on hold.  Aspirin on hold. ? ?Essential hypertension ?Holding antihypertensives including clonidine, metoprolol and Lotrel.   ? ?Acquired hypothyroidism ?Continue levothyroxine ? ?  Thrombocytosis ?Patient with history of JAK2 gene mutation and thrombocytosis.  Followed by oncology.  On Droxia for the same which is currently on hold.  Continue to monitor ? ?Restless legs syndrome ?Continue ropinirole. ? ?Cholelithiasis ?Incidentally noted on CT scan.  LFTs are normal.  Abdomen is benign.   Continue to monitor. ? ?Leukocytosis ?Elevated WBC noted.  Patient noted to be afebrile.  No obvious source of infection identified.  UA is pending.  Continue to monitor without adding antibiotics at this time. ? ? ? ? ?DVT Prophylaxis: SCDs ?Code Status: Full code ?Family Communication: Discussed with patient.  No family at bedside ?Disposition Plan: Hopefully return home when improved ? ?Status is: Inpatient ?Remains inpatient appropriate because: Lower GI bleed ? ? ? ? ?Medications: Scheduled: ? Chlorhexidine Gluconate Cloth  6 each Topical Q0600  ? insulin aspart  0-15 Units Subcutaneous Q4H  ? latanoprost  1 drop Both Eyes QHS  ? levothyroxine  88 mcg Oral Daily  ? [START ON 12/14/2021] pantoprazole  40 mg Intravenous Q12H  ? polyethylene glycol-electrolytes  4,000 mL Oral Once  ? rOPINIRole  0.5 mg Oral QHS  ? sodium zirconium cyclosilicate  10 g Oral L4J  ? timolol  1 drop Both Eyes q morning  ? ?Continuous: ? sodium chloride 100 mL/hr at 12/12/21 1300  ? pantoprazole 8 mg/hr (12/12/21 1300)  ? ?ZPH:[id number] **OR** acetaminophen, fentaNYL (SUBLIMAZE) injection, ondansetron **OR** ondansetron (ZOFRAN) IV ? ?Antibiotics: ?Anti-infectives (From admission, onward)  ? ? None  ? ?  ? ? ?Objective: ? ?Vital Signs ? ?Vitals:  ? 12/12/21 0800 12/12/21 1100 12/12/21 1200 12/12/21 1300  ?BP: 132/85 (!) 158/55 (!) 165/79 (!) 157/61  ?Pulse: 85 87 90 82  ?Resp: (!) 23 (!) 24 20 (!) 27  ?Temp:    98.2 ?F (36.8 ?C)  ?TempSrc:    Oral  ?SpO2: 96% 96% 100% 94%  ?Weight:      ?Height:      ? ? ?Intake/Output Summary (Last 24 hours) at 12/12/2021 1335 ?Last data filed at 12/12/2021 1300 ?Gross per 24 hour  ?Intake 2630.25 ml  ?Output 600 ml  ?Net 2030.25 ml  ? ?Filed Weights  ? 12/10/21 2211 12/11/21 1543  ?Weight: 64.4 kg 63.3 kg  ? ? ?General appearance: Awake alert.  In no distress ?Resp: Clear to auscultation bilaterally.  Normal effort ?Cardio: S1-S2 is normal regular.  No S3-S4.  No rubs murmurs or bruit ?GI: Abdomen is  soft.  Nontender nondistended.  Bowel sounds are present normal.  No masses organomegaly ?Extremities: No edema.  Full range of motion of lower extremities. ?Neurologic: Alert and oriented x3.  No focal neurological deficits.  ? ? ? ? ?Lab Results: ? ?Data Reviewed: I have personally reviewed labs and imaging study reports ? ?CBC: ?Recent Labs  ?Lab 12/10/21 ?2213 12/11/21 ?0108 12/11/21 ?5537 12/11/21 ?1200 12/11/21 ?2304 12/12/21 ?0406 12/12/21 ?1219  ?WBC 17.3*  --   --   --   --  23.6* 22.8*  ?NEUTROABS 11.7*  --   --   --   --   --   --   ?HGB 13.7   < > 9.4* 10.4* 9.4* 8.9* 8.8*  ?HCT 45.7   < > 32.4* 35.6* 30.8* 29.4* 29.2*  ?MCV 88.7  --   --   --   --  92.5 94.5  ?PLT 856*  --   --   --   --  484* 484*  ? < > = values in this interval  not displayed.  ? ? ?Basic Metabolic Panel: ?Recent Labs  ?Lab 12/10/21 ?2213 12/12/21 ?0406 12/12/21 ?1219  ?NA 139 140 142  ?K 5.0 6.2* 5.7*  ?CL 111 116* 115*  ?CO2 22 18* 18*  ?GLUCOSE 157* 86 75  ?BUN 35* 43* 40*  ?CREATININE 2.67* 4.21* 3.93*  ?CALCIUM 8.7* 7.3* 7.4*  ? ? ?GFR: ?Estimated Creatinine Clearance: 7.6 mL/min (A) (by C-G formula based on SCr of 3.93 mg/dL (H)). ? ?Liver Function Tests: ?Recent Labs  ?Lab 12/10/21 ?2213  ?AST 27  ?ALT 9  ?ALKPHOS 47  ?BILITOT 0.7  ?PROT 7.3  ?ALBUMIN 2.9*  ? ? ? ?Coagulation Profile: ?Recent Labs  ?Lab 12/10/21 ?2213  ?INR 1.1  ? ? ? ?Recent Results (from the past 240 hour(s))  ?Resp Panel by RT-PCR (Flu A&B, Covid) Nasopharyngeal Swab     Status: None  ? Collection Time: 12/11/21 12:26 AM  ? Specimen: Nasopharyngeal Swab; Nasopharyngeal(NP) swabs in vial transport medium  ?Result Value Ref Range Status  ? SARS Coronavirus 2 by RT PCR NEGATIVE NEGATIVE Final  ?  Comment: (NOTE) ?SARS-CoV-2 target nucleic acids are NOT DETECTED. ? ?The SARS-CoV-2 RNA is generally detectable in upper respiratory ?specimens during the acute phase of infection. The lowest ?concentration of SARS-CoV-2 viral copies this assay can detect is ?138  copies/mL. A negative result does not preclude SARS-Cov-2 ?infection and should not be used as the sole basis for treatment or ?other patient management decisions. A negative result may occur with  ?improper specim

## 2021-12-12 NOTE — Progress Notes (Signed)
Received call from [place] stating that pt was complaining of being "nervous". Discussed with Dr. Maryland Pink via secure chat, and received order for Ativan 0.5 mg IV x 1. This RN went to talk with patient in [place] and patient complained of anxiety and wanted medication to help. Pt VS stable and sat 95% on 2 liters [place]; no change in VS after Ativan given. Pt confirmed that she was aware of what was going on and was willing to undergo remainder of the scan. Verbalized appreciation and stated felt a little better after med given.  ?

## 2021-12-12 NOTE — Progress Notes (Signed)
GI Inpatient Follow-up Note ? ?Subjective: ? ?Patient seen and a bowel movement with small amount of clots. Does not want to undergo colonoscopy. She's ok with undergoing tagged rbc scan. ? ?Scheduled Inpatient Medications:  ? Chlorhexidine Gluconate Cloth  6 each Topical Q0600  ? insulin aspart  0-15 Units Subcutaneous Q4H  ? latanoprost  1 drop Both Eyes QHS  ? levothyroxine  88 mcg Oral Daily  ? [START ON 12/14/2021] pantoprazole  40 mg Intravenous Q12H  ? polyethylene glycol-electrolytes  4,000 mL Oral Once  ? rOPINIRole  0.5 mg Oral QHS  ? sodium zirconium cyclosilicate  10 g Oral G3T  ? timolol  1 drop Both Eyes q morning  ? ? ?Continuous Inpatient Infusions: ?  ? sodium chloride    ? pantoprazole 8 mg/hr (12/12/21 0100)  ? ? ?PRN Inpatient Medications:  ?acetaminophen **OR** acetaminophen, fentaNYL (SUBLIMAZE) injection, ondansetron **OR** ondansetron (ZOFRAN) IV ? ?Review of Systems: ? ?Review of Systems  ?Constitutional:  Negative for chills and fever.  ?Respiratory:  Negative for shortness of breath.   ?Cardiovascular:  Negative for chest pain.  ?Gastrointestinal:  Positive for blood in stool. Negative for abdominal pain and diarrhea.  ?Musculoskeletal:  Negative for joint pain.  ?Skin:  Negative for itching and rash.  ?Neurological:  Negative for focal weakness.  ?Psychiatric/Behavioral:  Negative for substance abuse.   ?All other systems reviewed and are negative.  ?  ?Physical Examination: ?BP 132/85   Pulse 85   Temp 98.4 ?F (36.9 ?C) (Oral)   Resp (!) 23   Ht 5' (1.524 m)   Wt 63.3 kg   SpO2 96%   BMI 27.25 kg/m?  ?Gen: NAD, alert and oriented x 4 ?HEENT: PEERLA, EOMI, ?Neck: supple, no JVD or thyromegaly ?Chest: No respiratory distress ?CV: RRR ?Abd: soft, non-tender, non-distended ?Ext: no edema, well perfused with 2+ pulses, ?Skin: no rash or lesions noted ?Lymph: no LAD ? ?Data: ?Lab Results  ?Component Value Date  ? WBC 23.6 (H) 12/12/2021  ? HGB 8.9 (L) 12/12/2021  ? HCT 29.4 (L)  12/12/2021  ? MCV 92.5 12/12/2021  ? PLT 484 (H) 12/12/2021  ? ?Recent Labs  ?Lab 12/11/21 ?1200 12/11/21 ?2304 12/12/21 ?0406  ?HGB 10.4* 9.4* 8.9*  ? ?Lab Results  ?Component Value Date  ? NA 140 12/12/2021  ? K 6.2 (H) 12/12/2021  ? CL 116 (H) 12/12/2021  ? CO2 18 (L) 12/12/2021  ? BUN 43 (H) 12/12/2021  ? CREATININE 4.21 (H) 12/12/2021  ? ?Lab Results  ?Component Value Date  ? ALT 9 12/10/2021  ? AST 27 12/10/2021  ? ALKPHOS 47 12/10/2021  ? BILITOT 0.7 12/10/2021  ? ?Recent Labs  ?Lab 12/10/21 ?2213  ?INR 1.1  ? ?Assessment/Plan: ?88 y/o lady with history of hypertension, CKD IV, DM II, hypothyroidism, JAK2 mutation with thrombocytosis and diverticulosis who presents with several episodes of hematochezia since yesterday which is likely diverticular in nature. Discussed with patient about need for colonoscopy but patient is refusing to undergo colonoscopy at this point. She is willing to undergo tagged rbc scan ? ?Recommendations: ? ?- two large bore IV's ?- maintain active type and screen ?- transfuse for hemoglobin < 7 ?- tagged rbc scan ?- clear liquid diet ?- hold aspirin or any prophylactic heparin products ?- patient refusing colonoscopy at this time. ?  ?Will continue to follow, please call with any questions or concerns. ? ?Raylene Miyamoto MD, MPH ?[facility] ? ?  ?

## 2021-12-12 NOTE — Assessment & Plan Note (Addendum)
Presented with white blood cell count of 23.6 on admission.  Has been slowly trending downward without antibiotics or other intervention.  Afebrile.  No obvious source of infection and likely reactive.  Continue to follow.  By day of discharge, down to 13.3. ?

## 2021-12-12 NOTE — Progress Notes (Addendum)
Patient returned to room from nuclear medicine; per [facility] staff scan was not completed as pt was unwilling or unable to complete study. BP elevated. Dr. Maryland Pink notified of pt status. Hydralazine ordered for prn use. Pt is very anxious, intermittently tearful, appears fearful stating her nerves are bad. She is oriented x 4, and answers question appositely, but very restless, moving about in bed, fidgety, and unable to settle and asking for more ativan. Bed alarm on and patient reassured.  ? ?Husband contacted and notified of patient restlessness; states he will be here shortly.  ?

## 2021-12-12 NOTE — Progress Notes (Addendum)
Patient less restless but remains forgetful. BP elevated; medicated with hydralazine for elevated BP. Husband at bedside. Pt requesting more anxiety medication, but explained that we are suspicious ativan has worsened her anxiety. Dr. Maryland Pink updated via secure chat.  ?

## 2021-12-13 ENCOUNTER — Encounter
Admission: EM | Disposition: A | Discharge status: Home / Self Care | Payer: Self-pay | Source: Home / Self Care | Attending: Internal Medicine

## 2021-12-13 DIAGNOSIS — K921 Melena: Secondary | ICD-10-CM | POA: Diagnosis not present

## 2021-12-13 DIAGNOSIS — I10 Essential (primary) hypertension: Secondary | ICD-10-CM

## 2021-12-13 DIAGNOSIS — D72829 Elevated white blood cell count, unspecified: Secondary | ICD-10-CM

## 2021-12-13 DIAGNOSIS — D62 Acute posthemorrhagic anemia: Secondary | ICD-10-CM | POA: Diagnosis not present

## 2021-12-13 LAB — BASIC METABOLIC PANEL
Anion gap: 6 (ref 5–15)
BUN: 35 mg/dL — ABNORMAL HIGH (ref 8–23)
CO2: 18 mmol/L — ABNORMAL LOW (ref 22–32)
Calcium: 7.7 mg/dL — ABNORMAL LOW (ref 8.9–10.3)
Chloride: 118 mmol/L — ABNORMAL HIGH (ref 98–111)
Creatinine, Ser: 3.43 mg/dL — ABNORMAL HIGH (ref 0.44–1.00)
GFR, Estimated: 12 mL/min — ABNORMAL LOW (ref >60)
Glucose, Bld: 86 mg/dL (ref 70–99)
Potassium: 4.7 mmol/L (ref 3.5–5.1)
Sodium: 142 mmol/L (ref 135–145)

## 2021-12-13 LAB — CBC
HCT: 27.1 % — ABNORMAL LOW (ref 36.0–46.0)
Hemoglobin: 8.4 g/dL — ABNORMAL LOW (ref 12.0–15.0)
MCH: 28.1 pg (ref 26.0–34.0)
MCHC: 31 g/dL (ref 30.0–36.0)
MCV: 90.6 fL (ref 80.0–100.0)
Platelets: 473 10*3/uL — ABNORMAL HIGH (ref 150–400)
RBC: 2.99 MIL/uL — ABNORMAL LOW (ref 3.87–5.11)
RDW: 17.1 % — ABNORMAL HIGH (ref 11.5–15.5)
WBC: 19.9 10*3/uL — ABNORMAL HIGH (ref 4.0–10.5)
nRBC: 0.2 % (ref 0.0–0.2)

## 2021-12-13 LAB — GLUCOSE, CAPILLARY
Glucose-Capillary: 91 mg/dL (ref 70–99)
Glucose-Capillary: 76 mg/dL (ref 70–99)
Glucose-Capillary: 77 mg/dL (ref 70–99)
Glucose-Capillary: 93 mg/dL (ref 70–99)

## 2021-12-13 SURGERY — COLONOSCOPY WITH PROPOFOL
Anesthesia: Monitor Anesthesia Care

## 2021-12-13 MED ORDER — SODIUM CHLORIDE 0.45 % IV SOLN: INTRAVENOUS | Status: DC

## 2021-12-13 MED ORDER — PANTOPRAZOLE SODIUM 40 MG PO TBEC
40.0000 mg | DELAYED_RELEASE_TABLET | Freq: Every day | ORAL | Status: DC
Start: 2021-12-13 — End: 2021-12-13

## 2021-12-13 MED ORDER — PANTOPRAZOLE SODIUM 40 MG IV SOLR
40.0000 mg | Freq: Two times a day (BID) | INTRAVENOUS | Status: DC
  Administered 2021-12-13 – 2021-12-15 (×4): 40 mg via INTRAVENOUS
  Filled 2021-12-13 (×5): qty 10

## 2021-12-13 MED ORDER — CLONIDINE HCL 0.1 MG PO TABS
0.1000 mg | ORAL_TABLET | Freq: Two times a day (BID) | ORAL | Status: DC
  Administered 2021-12-13 – 2021-12-15 (×5): 0.1 mg via ORAL
  Filled 2021-12-13 (×5): qty 1

## 2021-12-13 MED ORDER — METOPROLOL SUCCINATE ER 50 MG PO TB24
50.0000 mg | ORAL_TABLET | Freq: Every day | ORAL | Status: DC
  Administered 2021-12-13 – 2021-12-15 (×3): 50 mg via ORAL
  Filled 2021-12-13 (×3): qty 1

## 2021-12-13 NOTE — Care Plan (Signed)
Patient refused prep overnight and is refusing colonoscopy. No further bleeding. Since she is refusing colonoscopy and no further bleeding, she can eat from my perspective. Will follow peripherally, please call with any questions or concerns. ? ?Cassandra Miyamoto MD, MPH ?[facility] ?

## 2021-12-13 NOTE — Progress Notes (Signed)
? ?[facility] ?PROGRESS NOTE ? ? ?Cassandra [name] WTU:[id number] DOB: 1926/11/28 DOA: 12/10/2021  2 ?DOS: the patient was seen and examined on 12/13/2021 ? ?PCP: Rusty Aus, MD ? ?Brief History and Hospital Course:  ?88 y.o. female with medical history significant for HTN, DM, hypothyroidism, CKD 4, CAD,  hospitalized from 1/8 to 10/05/2021 with hematochezia with stable hemoglobin, declining colonoscopy due to prior history of colon rupture during routine colonoscopy 10 years prior. Subsequently admitted a week later from 1/18 to 1/21 with an acute CVA, discharged on aspirin, who presents to the ED with rectal bleeding.  Patient subsequently experienced a significant drop in hemoglobin.  She was noted to be hypotensive.  She was ordered blood transfusion.  Hospitalized for further management.  Gastroenterology was consulted.  Nuclear medicine RBC scan was negative.  Patient went back and forth regarding colonoscopy and finally agrees to do it. ? ?Consultants: Gastroenterology ? ?Procedures: None yet ? ? ? ?Subjective: ?Reports of feeling well.  Had bowel movement yesterday from her prep without any bleeding.  No abdominal pain.  No nausea or vomiting.   ? ? ?Assessment/Plan: ? ? ?* Hematochezia, recurrent ?Her previous episode of hematochezia was 2 months ago which resolved with conservative management.  At that time she did not have any significant drop in her hemoglobin. ?Patient denies any history of melanotic stools.  No history of GERD.  Does not take NSAIDs. ?This is most likely a diverticular bleed  ?Continue PPI for now.  Changed to twice a day instead of infusion. ?Gastroenterology following.  Tagged RBC nuclear medicine scan negative for active bleeding.  For the most part.  Plan is for colonoscopy today.   ? ?Hypotension ?Secondary to GI bleed.  Antihypertensives were held.  Now hypertensive.   ? ?Acute blood loss anemia ?Significant drop in hemoglobin noted from 13.7 to 9.4.  She was transfused  1 unit of PRBC.  Hemoglobin between 8 and 9.  Stable in the last 24 hours.  Continue to monitor.   ? ?CKD (chronic kidney disease) stage 4, GFR 15-29 ml/min (HCC) ?Acute kidney injury ?Hyperkalemia ? ?Baseline creatinine seems to be around 2.4.   ?Creatinine noted to be significantly on 3/20 at 4.21.  Also noted to be hypokalemic.   ?Worsening renal function likely due to combination of volume loss as well as hypotension.  UA reviewed.  Renal function is gradually improving.  Continue IV fluids.  She was given [facility] with improvement in potassium levels.   ?She is followed by Dr. Candiss Norse with nephrology.  Since renal function is improving we can hold off on nephrology consultation at this time. ?Monitor urine output. ? ?Recent cerebrovascular accident 10/12/2021 (CVA) ?Diagnosed with acute stroke in January 2023.  She had left-sided weakness at that time.  Placed on aspirin.  Currently on hold due to GI bleed.  Statin is currently on hold.  Should be able to resume once renal function gets back to baseline ? ?Diabetes mellitus, type II ([place]) ?HbA1c 6.4 in January.  Do not see any diabetic medications in her home medication list.  Continue SSI for now. ? ?CAD (coronary artery disease), native coronary artery ?Cardiac status noted to be stable.   ?Aspirin and statin on hold currently. ? ?Essential hypertension ?Initially antihypertensives were held due to hypotension.  Blood pressure now in the hypertensive range.  Likely rebound hypertension since she was on clonidine prior to admission.  ?Hydralazine as needed for was ordered yesterday.  ?Should be able to resume  her antihypertensives including clonidine and metoprolol.  Will continue to hold Lotrel for today and then reassess tomorrow. ? ?Acquired hypothyroidism ?Continue levothyroxine ? ?Thrombocytosis ?Patient with history of JAK2 gene mutation and thrombocytosis.  Followed by oncology.  On Droxia for the same which is currently on hold.  Continue to  monitor ? ?Restless legs syndrome ?Continue ropinirole. ? ?Cholelithiasis ?Incidentally noted on CT scan.  LFTs are normal.  Abdomen is benign.  Continue to monitor. ? ?Leukocytosis ?Elevated WBC was noted.  She was afebrile.  No obvious source of infection.  Likely reactive.  Seems to be improving.  Continue to monitor off of antibiotics.   ? ? ? ? ?DVT Prophylaxis: SCDs ?Code Status: Full code ?Family Communication: Discussed with patient.  No family at bedside ?Disposition Plan: Hopefully return home when improved ? ?Status is: Inpatient ?Remains inpatient appropriate because: Lower GI bleed ? ? ? ? ?Medications: Scheduled: ? Chlorhexidine Gluconate Cloth  6 each Topical Q0600  ? insulin aspart  0-15 Units Subcutaneous Q4H  ? latanoprost  1 drop Both Eyes QHS  ? levothyroxine  88 mcg Oral Daily  ? [START ON 12/14/2021] pantoprazole  40 mg Intravenous Q12H  ? rOPINIRole  0.5 mg Oral QHS  ? timolol  1 drop Both Eyes q morning  ? ?Continuous: ? sodium chloride 75 mL/hr at 12/12/21 2024  ? pantoprazole 8 mg/hr (12/13/21 0857)  ? ?FBP:ZWCHENIDPOEUM **OR** acetaminophen, fentaNYL (SUBLIMAZE) injection, hydrALAZINE, ondansetron **OR** ondansetron (ZOFRAN) IV ? ?Antibiotics: ?Anti-infectives (From admission, onward)  ? ? None  ? ?  ? ? ?Objective: ? ?Vital Signs ? ?Vitals:  ? 12/13/21 0600 12/13/21 0730 12/13/21 0800 12/13/21 0830  ?BP: (!) 162/70  (!) 160/60   ?Pulse: 88 94 86   ?Resp: 15 16 (!) 26   ?Temp:    98.4 ?F (36.9 ?C)  ?TempSrc:    Oral  ?SpO2: 99% 99% 94%   ?Weight:      ?Height:      ? ? ?Intake/Output Summary (Last 24 hours) at 12/13/2021 0936 ?Last data filed at 12/13/2021 0900 ?Gross per 24 hour  ?Intake 1424.75 ml  ?Output 2150 ml  ?Net -725.25 ml  ? ? ?Filed Weights  ? 12/10/21 2211 12/11/21 1543  ?Weight: 64.4 kg 63.3 kg  ? ? ?General appearance: Awake alert.  In no distress ?Resp: Clear to auscultation bilaterally.  Normal effort ?Cardio: S1-S2 is normal regular.  No S3-S4.  No rubs murmurs or bruit ?GI:  Abdomen is soft.  Nontender nondistended.  Bowel sounds are present normal.  No masses organomegaly ?Extremities: No edema.  Full range of motion of lower extremities. ?Neurologic: Alert and oriented x3.  No focal neurological deficits.  ? ? ? ?Lab Results: ? ?Data Reviewed: I have personally reviewed labs and imaging study reports ? ?CBC: ?Recent Labs  ?Lab 12/10/21 ?2213 12/11/21 ?0108 12/11/21 ?1200 12/11/21 ?2304 12/12/21 ?0406 12/12/21 ?1219 12/13/21 ?3536  ?WBC 17.3*  --   --   --  23.6* 22.8* 19.9*  ?NEUTROABS 11.7*  --   --   --   --   --   --   ?HGB 13.7   < > 10.4* 9.4* 8.9* 8.8* 8.4*  ?HCT 45.7   < > 35.6* 30.8* 29.4* 29.2* 27.1*  ?MCV 88.7  --   --   --  92.5 94.5 90.6  ?PLT 856*  --   --   --  484* 484* 473*  ? < > = values in this interval not displayed.  ? ? ? ?  Basic Metabolic Panel: ?Recent Labs  ?Lab 12/10/21 ?2213 12/12/21 ?0406 12/12/21 ?1219 12/13/21 ?4287  ?NA 139 140 142 142  ?K 5.0 6.2* 5.7* 4.7  ?CL 111 116* 115* 118*  ?CO2 22 18* 18* 18*  ?GLUCOSE 157* 86 75 86  ?BUN 35* 43* 40* 35*  ?CREATININE 2.67* 4.21* 3.93* 3.43*  ?CALCIUM 8.7* 7.3* 7.4* 7.7*  ? ? ? ?GFR: ?Estimated Creatinine Clearance: 8.7 mL/min (A) (by C-G formula based on SCr of 3.43 mg/dL (H)). ? ?Liver Function Tests: ?Recent Labs  ?Lab 12/10/21 ?2213  ?AST 27  ?ALT 9  ?ALKPHOS 47  ?BILITOT 0.7  ?PROT 7.3  ?ALBUMIN 2.9*  ? ? ? ? ?Coagulation Profile: ?Recent Labs  ?Lab 12/10/21 ?2213  ?INR 1.1  ? ? ? ? ?Recent Results (from the past 240 hour(s))  ?Resp Panel by RT-PCR (Flu A&B, Covid) Nasopharyngeal Swab     Status: None  ? Collection Time: 12/11/21 12:26 AM  ? Specimen: Nasopharyngeal Swab; Nasopharyngeal(NP) swabs in vial transport medium  ?Result Value Ref Range Status  ? SARS Coronavirus 2 by RT PCR NEGATIVE NEGATIVE Final  ?  Comment: (NOTE) ?SARS-CoV-2 target nucleic acids are NOT DETECTED. ? ?The SARS-CoV-2 RNA is generally detectable in upper respiratory ?specimens during the acute phase of infection. The  lowest ?concentration of SARS-CoV-2 viral copies this assay can detect is ?138 copies/mL. A negative result does not preclude SARS-Cov-2 ?infection and should not be used as the sole basis for treatment or ?other patient managem

## 2021-12-13 NOTE — Progress Notes (Signed)
Report called to [facility], RN, and patient was transported via bed to room 105. Met Nurse Ebony Hail in room for final handoff. Patient placed on telemetry. Pt comfortable without complaints.  ?

## 2021-12-14 DIAGNOSIS — D62 Acute posthemorrhagic anemia: Secondary | ICD-10-CM | POA: Diagnosis not present

## 2021-12-14 DIAGNOSIS — N179 Acute kidney failure, unspecified: Secondary | ICD-10-CM | POA: Diagnosis not present

## 2021-12-14 DIAGNOSIS — K921 Melena: Secondary | ICD-10-CM | POA: Diagnosis not present

## 2021-12-14 DIAGNOSIS — N184 Chronic kidney disease, stage 4 (severe): Secondary | ICD-10-CM

## 2021-12-14 DIAGNOSIS — E663 Overweight: Secondary | ICD-10-CM | POA: Diagnosis present

## 2021-12-14 LAB — BASIC METABOLIC PANEL
Anion gap: 3 — ABNORMAL LOW (ref 5–15)
BUN: 31 mg/dL — ABNORMAL HIGH (ref 8–23)
CO2: 22 mmol/L (ref 22–32)
Calcium: 7.8 mg/dL — ABNORMAL LOW (ref 8.9–10.3)
Chloride: 114 mmol/L — ABNORMAL HIGH (ref 98–111)
Creatinine, Ser: 3.37 mg/dL — ABNORMAL HIGH (ref 0.44–1.00)
GFR, Estimated: 12 mL/min — ABNORMAL LOW (ref >60)
Glucose, Bld: 87 mg/dL (ref 70–99)
Potassium: 5.2 mmol/L — ABNORMAL HIGH (ref 3.5–5.1)
Sodium: 139 mmol/L (ref 135–145)

## 2021-12-14 LAB — GLUCOSE, CAPILLARY
Glucose-Capillary: 154 mg/dL — ABNORMAL HIGH (ref 70–99)
Glucose-Capillary: 80 mg/dL (ref 70–99)
Glucose-Capillary: 80 mg/dL (ref 70–99)
Glucose-Capillary: 87 mg/dL (ref 70–99)
Glucose-Capillary: 89 mg/dL (ref 70–99)
Glucose-Capillary: 95 mg/dL (ref 70–99)
Glucose-Capillary: 98 mg/dL (ref 70–99)

## 2021-12-14 LAB — CBC
HCT: 26 % — ABNORMAL LOW (ref 36.0–46.0)
Hemoglobin: 8.2 g/dL — ABNORMAL LOW (ref 12.0–15.0)
MCH: 28.8 pg (ref 26.0–34.0)
MCHC: 31.5 g/dL (ref 30.0–36.0)
MCV: 91.2 fL (ref 80.0–100.0)
Platelets: 434 10*3/uL — ABNORMAL HIGH (ref 150–400)
RBC: 2.85 MIL/uL — ABNORMAL LOW (ref 3.87–5.11)
RDW: 17.2 % — ABNORMAL HIGH (ref 11.5–15.5)
WBC: 16.2 10*3/uL — ABNORMAL HIGH (ref 4.0–10.5)
nRBC: 0 % (ref 0.0–0.2)

## 2021-12-14 MED ORDER — AMLODIPINE BESYLATE 5 MG PO TABS
5.0000 mg | ORAL_TABLET | Freq: Every day | ORAL | Status: DC
  Administered 2021-12-14 – 2021-12-15 (×2): 5 mg via ORAL
  Filled 2021-12-14 (×2): qty 1

## 2021-12-14 MED ORDER — BENAZEPRIL HCL 20 MG PO TABS
20.0000 mg | ORAL_TABLET | Freq: Every day | ORAL | Status: DC
  Administered 2021-12-14 – 2021-12-15 (×2): 20 mg via ORAL
  Filled 2021-12-14 (×2): qty 1

## 2021-12-14 MED ORDER — SODIUM ZIRCONIUM CYCLOSILICATE 10 G PO PACK
10.0000 g | PACK | Freq: Once | ORAL | Status: AC
  Administered 2021-12-14: 10 g via ORAL
  Filled 2021-12-14: qty 1

## 2021-12-14 MED ORDER — ALUM & MAG HYDROXIDE-SIMETH 200-200-20 MG/5ML PO SUSP
30.0000 mL | Freq: Four times a day (QID) | ORAL | Status: DC | PRN
  Administered 2021-12-14: 30 mL via ORAL
  Filled 2021-12-14: qty 30

## 2021-12-14 MED ORDER — CHLORHEXIDINE GLUCONATE CLOTH 2 % EX PADS
6.0000 | MEDICATED_PAD | Freq: Every day | CUTANEOUS | Status: DC
  Administered 2021-12-14 – 2021-12-15 (×2): 6 via TOPICAL

## 2021-12-14 NOTE — Progress Notes (Signed)
PT Cancellation Note ? ?Patient Details ?Name: Cassandra [name] ?MRN: [id number] ?DOB: [birth year] ? ? ?Cancelled Treatment:    Reason Eval/Treat Not Completed: Patient declined, no reason specified Pt reports being "anxious and flustered" w/ hospital staff; states she will work w/ PT when husband gets back to the hospital, but she is unsure of when that will be. PT to attempt at later date/time. ? ? ?Jonnie Kind, SPT ?12/14/2021, 3:55 PM ?

## 2021-12-14 NOTE — Progress Notes (Signed)
[facility] Progress Note ? ?Patient: Cassandra [name]    FBP:[id number]  DOA: 12/10/2021    ?Date of Service: the patient was seen and examined on 12/14/2021 ? ?Brief hospital course: ?88 y.o. female with medical history significant for HTN, DM, hypothyroidism, CKD 4, CAD,  hospitalized from 1/8 to 10/05/2021 with hematochezia with stable hemoglobin, declining colonoscopy due to prior history of colon rupture during routine colonoscopy 10 years prior. Subsequently admitted a week later from 1/18 to 1/21 with an acute CVA, discharged on aspirin, who presents to the ED with rectal bleeding.  Patient subsequently experienced a significant drop in hemoglobin.  She was noted to be hypotensive.  She was ordered blood transfusion.  Hospitalized for further management.  Gastroenterology was consulted.  Nuclear medicine RBC scan was negative.  Patient remained undecided regarding colonoscopy and eventually agreed to proceed, but then refused again on 3/21. ? ?Assessment and Plan: ?Assessment and Plan: ?* Hematochezia, recurrent ?Her previous episode of hematochezia was 2 months ago which resolved with conservative management.  At that time she did not have any significant drop in her hemoglobin.  Patient denies any history of melanotic stools.  No history of GERD.  Does not take NSAIDs.  Suspect that this is most likely a diverticular bleed. ? ?Continue PPI for now.  Changed to twice a day instead of infusion. ?Gastroenterology following.  Tagged RBC nuclear medicine scan negative for active bleeding.  Since patient has refused again colonoscopy, will manage conservatively. ? ?Hypotension ?Secondary to GI bleed.  Antihypertensives were held.  Now hypertensive.   ? ?Acute blood loss anemia ?Significant drop in hemoglobin noted from 13.7 to 9.4.  She was transfused 1 unit of PRBC.  Hemoglobin between 8 and 9.  Stable in the last 24 hours.  Continue to monitor.   ? ?AKI (acute kidney injury) ([place]) ?Initially presented with  creatinine as high as 4.21, secondary to blood loss anemia.  Has been slowly trending downward.  Aspirin on hold.  This is in the setting of stage IV chronic kidney disease.  Will increase IV fluids and recheck kidney function in the morning. ? ?CKD (chronic kidney disease) stage 4, GFR 15-29 ml/min (HCC) ?Hyperkalemia ? ?Baseline creatinine seems to be around 2.4.   ?Creatinine noted to be significantly on 3/20 at 4.21.  Also noted to be hyperkalemic. ?Worsening renal function likely due to combination of volume loss as well as hypotension.  UA reviewed.  Renal function is gradually improving.  Continue IV fluids.  She was given [facility] with improvement in potassium levels.   ?She is followed by Dr. Candiss Norse with nephrology.  Since renal function is improving we can hold off on nephrology consultation at this time. ?Monitor urine output. ? ?Diabetes mellitus, type II ([place]) ?HbA1c 6.4 in January.  Do not see any diabetic medications in her home medication list.  Continue SSI for now. ? ?Recent cerebrovascular accident 10/12/2021 (CVA) ?Diagnosed with acute stroke in January 2023.  She had left-sided weakness at that time.  Placed on aspirin.  Currently on hold due to GI bleed.  Statin is currently on hold.  Should be able to resume once renal function gets back to baseline ? ?Essential hypertension ?Initially antihypertensives were held due to hypotension.  Blood pressure now in the hypertensive range.  Likely rebound hypertension since she was on clonidine prior to admission.  Clonidine and metoprolol restarted.  Blood pressure still elevated so we will resume her Lotrel. ?Hydralazine as needed. ? ?CAD (coronary artery disease),  native coronary artery ?Cardiac status noted to be stable.   ?Aspirin and statin on hold currently. ? ?Restless legs syndrome ?Continue ropinirole. ? ?Thrombocytosis ?Patient with history of JAK2 gene mutation and thrombocytosis.  Followed by oncology.  On Droxia for the same which is currently  on hold.  Continue to monitor.  Platelet count in the 400s. ? ?Acquired hypothyroidism ?Continue levothyroxine ? ?Cholelithiasis ?Incidentally noted on CT scan.  LFTs are normal.  Abdomen is benign.  Continue to monitor. ? ?Leukocytosis ?Presented with white blood cell count of 23.6 on admission.  Has been slowly trending downward without antibiotics or other intervention.  Afebrile.  No obvious source of infection and likely reactive.  Continue to follow.  ? ?Overweight (BMI 25.0-29.9) ?Patient meets criteria BMI greater than 25. ? ? ? ? ? ? ?Body mass index is 27.25 kg/m?.  ?  ?   ? ?Consultants: ?Gastroenterology ? ?Procedures: ?None ? ?Antimicrobials: ?None ? ?Code Status: Full code ? ? ?Subjective: Patient doing okay, complains of being cold ? ?Objective: ?Noted blood pressure slightly elevated ?Vitals:  ? 12/14/21 0800 12/14/21 1155  ?BP: (!) 156/57 125/73  ?Pulse: 72 68  ?Resp:  16  ?Temp:  98.2 ?F (36.8 ?C)  ?SpO2:  97%  ? ? ?Intake/Output Summary (Last 24 hours) at 12/14/2021 1330 ?Last data filed at 12/14/2021 0058 ?Gross per 24 hour  ?Intake 629 ml  ?Output --  ?Net 629 ml  ? ?Filed Weights  ? 12/10/21 2211 12/11/21 1543  ?Weight: 64.4 kg 63.3 kg  ? ?Body mass index is 27.25 kg/m?. ? ?Exam: ? ?General: Alert and oriented x3, no acute distress ?HEENT: Normocephalic, atraumatic, mucous membranes slightly dry ?Cardiovascular: Regular rate and rhythm, S1-S2 ?Respiratory: Clear to auscultation bilaterally ?Abdomen: Soft, nontender, nondistended, positive bowel sounds ?Musculoskeletal: No clubbing or cyanosis or edema ?Skin: No skin breaks, tears or lesions ?Psychiatry: Appropriate, no evidence of psychoses ?Neurology: No deficits ? ?Data Reviewed: ?Improving creatinine, stable hemoglobin ? ?Disposition:  ?Status is: Inpatient ?Remains inpatient appropriate because: Continued need IV fluids to return renal function back to normal ?  ? ?Family Communication: Husband at the bedside ?DVT Prophylaxis: ?SCDs Start:  12/11/21 0209 ? ? ? ?Author: ?Annita Brod ,MD ?12/14/2021 1:30 PM ? ?To reach On-call, see care teams to locate the attending and reach out via [email or web address]. ?Between 7PM-7AM, please contact night-coverage ?If you still have difficulty reaching the attending provider, please page the [facility] (Director on Call) for [facility] on amion for assistance. ? ?

## 2021-12-14 NOTE — Assessment & Plan Note (Addendum)
Initially presented with creatinine as high as 4.21, secondary to blood loss anemia.  Has been slowly trending downward.  Aspirin on hold.  This is in the setting of stage IV chronic kidney disease.  By 3/23, creatinine down to 3.04.  Patient baseline around 2.7.  She received some additional fluids and also some blood and renal function should be very close to baseline.  Outpatient follow-up with her PCP and nephrology. ?

## 2021-12-14 NOTE — Assessment & Plan Note (Signed)
Patient meets criteria BMI greater than 25 

## 2021-12-15 DIAGNOSIS — D75839 Thrombocytosis, unspecified: Secondary | ICD-10-CM

## 2021-12-15 DIAGNOSIS — E663 Overweight: Secondary | ICD-10-CM

## 2021-12-15 LAB — CBC
HCT: 25.1 % — ABNORMAL LOW (ref 36.0–46.0)
Hemoglobin: 7.7 g/dL — ABNORMAL LOW (ref 12.0–15.0)
MCH: 27.5 pg (ref 26.0–34.0)
MCHC: 30.7 g/dL (ref 30.0–36.0)
MCV: 89.6 fL (ref 80.0–100.0)
Platelets: 425 10*3/uL — ABNORMAL HIGH (ref 150–400)
RBC: 2.8 MIL/uL — ABNORMAL LOW (ref 3.87–5.11)
RDW: 17 % — ABNORMAL HIGH (ref 11.5–15.5)
WBC: 13.3 10*3/uL — ABNORMAL HIGH (ref 4.0–10.5)
nRBC: 0.2 % (ref 0.0–0.2)

## 2021-12-15 LAB — GLUCOSE, CAPILLARY
Glucose-Capillary: 84 mg/dL (ref 70–99)
Glucose-Capillary: 86 mg/dL (ref 70–99)
Glucose-Capillary: 87 mg/dL (ref 70–99)
Glucose-Capillary: 87 mg/dL (ref 70–99)
Glucose-Capillary: 93 mg/dL (ref 70–99)

## 2021-12-15 LAB — BASIC METABOLIC PANEL
Anion gap: 8 (ref 5–15)
BUN: 25 mg/dL — ABNORMAL HIGH (ref 8–23)
CO2: 18 mmol/L — ABNORMAL LOW (ref 22–32)
Calcium: 7.7 mg/dL — ABNORMAL LOW (ref 8.9–10.3)
Chloride: 109 mmol/L (ref 98–111)
Creatinine, Ser: 3.04 mg/dL — ABNORMAL HIGH (ref 0.44–1.00)
GFR, Estimated: 14 mL/min — ABNORMAL LOW (ref >60)
Glucose, Bld: 75 mg/dL (ref 70–99)
Potassium: 4 mmol/L (ref 3.5–5.1)
Sodium: 135 mmol/L (ref 135–145)

## 2021-12-15 LAB — PREPARE RBC (CROSSMATCH)

## 2021-12-15 MED ORDER — SODIUM CHLORIDE 0.9% IV SOLUTION: Freq: Once | INTRAVENOUS | Status: AC

## 2021-12-15 MED ORDER — FUROSEMIDE 10 MG/ML IJ SOLN
20.0000 mg | Freq: Once | INTRAMUSCULAR | Status: AC
  Administered 2021-12-15: 20 mg via INTRAVENOUS
  Filled 2021-12-15: qty 2

## 2021-12-15 NOTE — Evaluation (Signed)
Physical Therapy Evaluation ?Patient Details ?Name: Cassandra [name] ?MRN: [id number] ?DOB: [birth year] ?Today's Date: 12/15/2021 ? ?History of Present Illness ? Pt is a 88 y.o. female patient who presents for evaluation of recurrent birght red blood per rectum. Recently admitted from 1/8-1/11 and recent CVA on 1/18. Currently admitted for hematochezia and hypotension.  PmHx: HTN, HLD, CAD, RLS, Polycythemia Vera, CKD, CVA. ?  ?Clinical Impression ? Pt resting in bed w/ husband at bedside upon PT entrance into room for evaluation today. Pt is A&Ox4 and denies any c/o pain at rest; is willing to work w/ PT today but states "not for too long, because I already have two therapists that come and work w/ me at home, so I don't need help here". Home set-up and PLOF were obtained from recent hospital admission and chart history. ? ?Pt is able to perform supine to sit bed mobility w/ minA for trunk elevation. Once seated EOB she is able to progress to standing w/ CGA using RW and was able to ambulate ~44f before requesting to return to bed due to increased fatigue. Pt will benefit from continued skilled PT in order to increase LE strength, improve endurance/mobility/gait, and restore PLOF. Current discharge recommendation to HHPT is appropriate due to the level of assistance required by the patient to ensure safety and improve overall function. ? ?   ? ?Recommendations for follow up therapy are one component of a multi-disciplinary discharge planning process, led by the attending physician.  Recommendations may be updated based on patient status, additional functional criteria and insurance authorization. ? ?Follow Up Recommendations Home health PT ? ?  ?Assistance Recommended at Discharge Frequent or constant Supervision/Assistance  ?Patient can return home with the following ? A little help with walking and/or transfers;A little help with bathing/dressing/bathroom;Assistance with cooking/housework;Assist for  transportation;Help with stairs or ramp for entrance ? ?  ?Equipment Recommendations None recommended by PT  ?Recommendations for Other Services ?    ?  ?Functional Status Assessment Patient has had a recent decline in their functional status and demonstrates the ability to make significant improvements in function in a reasonable and predictable amount of time.  ? ?  ?Precautions / Restrictions Precautions ?Precautions: Fall ?Restrictions ?Weight Bearing Restrictions: No  ? ?  ? ?Mobility ? Bed Mobility ?Overal bed mobility: Needs Assistance ?Bed Mobility: Supine to Sit, Sit to Supine ?  ?  ?Supine to sit: Min assist ?Sit to supine: Supervision ?  ?General bed mobility comments: minA provided for trunk elevation ?  ? ?Transfers ?Overall transfer level: Needs assistance ?Equipment used: Rolling walker (2 wheels) ?Transfers: Sit to/from Stand ?Sit to Stand: Min guard ?  ?  ?  ?  ?  ?  ?  ? ?Ambulation/Gait ?Ambulation/Gait assistance: Min guard ?Gait Distance (Feet): 40 Feet ?Assistive device: Rolling walker (2 wheels) ?Gait Pattern/deviations: Step-to pattern, Decreased step length - right, Decreased step length - left, Decreased stride length ?Gait velocity: decreased ?  ?  ?  ? ?Stairs ?  ?  ?  ?  ?  ? ?Wheelchair Mobility ?  ? ?Modified Rankin (Stroke Patients Only) ?  ? ?  ? ?Balance Overall balance assessment: Needs assistance ?Sitting-balance support: Feet supported, No upper extremity supported ?Sitting balance-Leahy Scale: Good ?  ?  ?Standing balance support: Bilateral upper extremity supported, During functional activity, Reliant on assistive device for balance ?Standing balance-Leahy Scale: Fair ?  ?  ?  ?  ?  ?  ?  ?  ?  ?  ?  ?  ?   ? ? ? ?  Pertinent Vitals/Pain Pain Assessment ?Pain Assessment: No/denies pain  ? ? ?Home Living Family/patient expects to be discharged to:: Private residence ?Living Arrangements: Spouse/significant other ?Available Help at Discharge: Family ?Type of Home: Apartment ?Home  Access: Level entry ?  ?  ?  ?Home Layout: One level ?Home Equipment: Rollator (4 wheels);Shower seat - built in ?   ?  ?Prior Function Prior Level of Function : Independent/Modified Independent;Needs assist ?  ?  ?  ?  ?  ?  ?Mobility Comments: use of rollator and sometimes no AD needed ?ADLs Comments: Per Pt chart history: "spouse there for shower transfers, provides transportation, pt indep with light meal prep and basic ADL" ?  ? ? ?Hand Dominance  ?   ? ?  ?Extremity/Trunk Assessment  ? Upper Extremity Assessment ?Upper Extremity Assessment: Generalized weakness;Overall [facility] for tasks assessed ?  ? ?Lower Extremity Assessment ?Lower Extremity Assessment: Overall WFL for tasks assessed;Generalized weakness ?  ? ?   ?Communication  ?    ?Cognition Arousal/Alertness: Awake/alert ?Behavior During Therapy: Flat affect ?Overall Cognitive Status: Within Functional Limits for tasks assessed ?  ?  ?  ?  ?  ?  ?  ?  ?  ?  ?  ?  ?  ?  ?  ?  ?  ?  ?  ? ?  ?General Comments   ? ?  ?Exercises    ? ?Assessment/Plan  ?  ?PT Assessment Patient needs continued PT services  ?PT Problem List Decreased strength;Decreased mobility;Decreased safety awareness;Decreased range of motion;Decreased activity tolerance;Decreased balance;Decreased knowledge of use of DME;Decreased cognition;Decreased coordination ? ?   ?  ?PT Treatment Interventions DME instruction;Therapeutic exercise;Gait training;Balance training;Stair training;Neuromuscular re-education;Functional mobility training;Therapeutic activities;Patient/family education   ? ?PT Goals (Current goals can be found in the Care Plan section)  ?Acute Rehab PT Goals ?Patient Stated Goal: to go home ?PT Goal Formulation: With patient ?Time For Goal Achievement: 12/29/21 ?Potential to Achieve Goals: Good ? ?  ?Frequency Min 2X/week ?  ? ? ?Co-evaluation   ?  ?  ?  ?  ? ? ?  ?AM-PAC PT "6 Clicks" Mobility  ?Outcome Measure Help needed turning from your back to your side while in a flat bed  without using bedrails?: A Little ?Help needed moving from lying on your back to sitting on the side of a flat bed without using bedrails?: A Little ?Help needed moving to and from a bed to a chair (including a wheelchair)?: A Little ?Help needed standing up from a chair using your arms (e.g., wheelchair or bedside chair)?: A Little ?Help needed to walk in hospital room?: A Little ?Help needed climbing 3-5 steps with a railing? : A Little ?6 Click Score: 18 ? ?  ?End of Session Equipment Utilized During Treatment: Gait belt ?Activity Tolerance: Patient tolerated treatment well;Patient limited by fatigue ?Patient left: in bed;with call bell/phone within reach;with bed alarm set;with family/visitor present ?Nurse Communication: Mobility status ?PT Visit Diagnosis: Unsteadiness on feet (R26.81);Muscle weakness (generalized) (M62.81) ?  ? ?Time: 1055-1110 ?PT Time Calculation (min) (ACUTE ONLY): 15 min ? ? ?Charges:     ?  ?  ?   ? ?Jonnie Kind, SPT ?12/15/2021, 11:21 AM ? ?

## 2021-12-15 NOTE — Care Management Important Message (Signed)
Important Message ? ?Patient Details  ?Name: Cassandra [name] ?MRN: [id number] ?Date of Birth: August 22, 1927 ? ? ?Medicare Important Message Given:  Yes ? ? ? ? ?Juliann Pulse A Melora Menon ?12/15/2021, 2:46 PM ?

## 2021-12-15 NOTE — Discharge Summary (Signed)
?Physician Discharge Summary ?  ?Patient: Cassandra [name] MRN: [id number] DOB: [birth year]  ?Admit date:     12/10/2021  ?Discharge date: 12/15/21  ?Discharge Physician: Annita Brod  ? ?PCP: Rusty Aus, MD  ? ?Recommendations at discharge:  ? ?Patient will follow-up with her PCP in 2 weeks. ? ?Discharge Diagnoses: ?Principal Problem: ?  Hematochezia, recurrent ?Active Problems: ?  Hypotension ?  Acute blood loss anemia ?  AKI (acute kidney injury) ([place]) ?  CKD (chronic kidney disease) stage 4, GFR 15-29 ml/min (HCC) ?  Diabetes mellitus, type II ([place]) ?  Recent cerebrovascular accident 10/12/2021 (CVA) ?  Essential hypertension ?  CAD (coronary artery disease), native coronary artery ?  Restless legs syndrome ?  Thrombocytosis ?  Acquired hypothyroidism ?  Cholelithiasis ?  Leukocytosis ?  Overweight (BMI 25.0-29.9) ? ?Resolved Problems: ?  * No resolved hospital problems. * ? ?Hospital Course: ?88 y.o. female with medical history significant for HTN, DM, hypothyroidism, CKD 4, CAD,  hospitalized from 1/8 to 10/05/2021 with hematochezia with stable hemoglobin, declining colonoscopy due to prior history of colon rupture during routine colonoscopy 10 years prior. Subsequently admitted a week later from 1/18 to 1/21 with an acute CVA, discharged on aspirin, who presents to the ED with rectal bleeding.  Patient subsequently experienced a significant drop in hemoglobin.  She was noted to be hypotensive.  She was ordered blood transfusion.  Hospitalized for further management.  Gastroenterology was consulted.  Nuclear medicine RBC scan was negative.  Patient remained undecided regarding colonoscopy and eventually agreed to proceed, but then refused again on 3/21.  By 3/23, patient felt to be stable with renal function close to baseline. ? ?Assessment and Plan: ?* Hematochezia, recurrent ?Her previous episode of hematochezia was 2 months ago which resolved with conservative management.  At that time she did not have  any significant drop in her hemoglobin.  Patient denies any history of melanotic stools.  No history of GERD.  Does not take NSAIDs other than her daily aspirin.  Suspect that this is most likely a diverticular bleed. ? ?Initially placed on PPI.  Once patient declined colonoscopy finally, gastroenterology signed off.Tagged RBC nuclear medicine scan negative for active bleeding.  With fluids improving her renal function back to baseline, she did have some hemoconcentration with hemoglobin down to 7.7 on 3/23.  She received a unit of blood for this.  I suspect following diverticular bleed, with correction of her renal function for hemoconcentration, her hemoglobin likely would have been around 7.  No further episodes of bleeding. ? ?Hypotension ?Initially, secondary to GI bleed.  Antihypertensives were held.  Now hypertensive.  Medications resumed ? ?Acute blood loss anemia ?Significant drop in hemoglobin noted from 13.7 to 9.4.  She was transfused 1 unit of PRBC.  Hemoglobin between 8 and 9.  As above, with IV fluids and renal function getting much closer to her baseline, hemoglobin down to 7.7.  From hemoconcentration.  Received 1 additional unit of blood on 3/23. ? ?AKI (acute kidney injury) ([place]) ?Initially presented with creatinine as high as 4.21, secondary to blood loss anemia.  Has been slowly trending downward.  Aspirin on hold.  This is in the setting of stage IV chronic kidney disease.  By 3/23, creatinine down to 3.04.  Patient baseline around 2.7.  She received some additional fluids and also some blood and renal function should be very close to baseline.  Outpatient follow-up with her PCP and nephrology. ? ?CKD (chronic kidney disease)  stage 4, GFR 15-29 ml/min (HCC) ?Hyperkalemia ? ?Baseline creatinine seems to be around 2.4.   ?Creatinine noted to be significantly on 3/20 at 4.21.  Also noted to be hyperkalemic. ?Worsening renal function likely due to combination of volume loss as well as hypotension.   UA reviewed.  Renal function is gradually improving.  Continue IV fluids.  She was given [facility] with improvement in potassium levels.   ?She is followed by Dr. Candiss Norse with nephrology.  Since renal function is improving we can hold off on nephrology consultation at this time. ?Monitor urine output. ? ?Diabetes mellitus, type II ([place]) ?HbA1c 6.4 in January.  Do not see any diabetic medications in her home medication list.  Continue SSI for now. ? ?Recent cerebrovascular accident 10/12/2021 (CVA) ?Diagnosed with acute stroke in January 2023.  She had left-sided weakness at that time.  Placed on aspirin.  Currently on hold due to GI bleed.  Statin is currently on hold.  Should be able to resume once renal function gets back to baseline ? ?Essential hypertension ?Initially antihypertensives were held due to hypotension.  Blood pressure now in the hypertensive range.  Likely rebound hypertension since she was on clonidine prior to admission.  Clonidine and metoprolol restarted.  Blood pressure still elevated so we will resume her Lotrel. ?Hydralazine as needed. ? ?CAD (coronary artery disease), native coronary artery ?Cardiac status noted to be stable.   ?Aspirin and statin on hold currently. ? ?Restless legs syndrome ?Continue ropinirole. ? ?Thrombocytosis ?Patient with history of JAK2 gene mutation and thrombocytosis.  Followed by oncology.  On Droxia for the same which is currently on hold.  Continue to monitor.  Platelet count in the 400s. ? ?Acquired hypothyroidism ?Continue levothyroxine ? ?Cholelithiasis ?Incidentally noted on CT scan.  LFTs are normal.  Abdomen is benign.  Continue to monitor. ? ?Leukocytosis ?Presented with white blood cell count of 23.6 on admission.  Has been slowly trending downward without antibiotics or other intervention.  Afebrile.  No obvious source of infection and likely reactive.  Continue to follow.  By day of discharge, down to 13.3. ? ?Overweight (BMI 25.0-29.9) ?Patient meets  criteria BMI greater than 25. ? ? ? ? ?  ? ? ?Consultants: Gastroenterology ?Procedures performed: Transfusion 2 units packed red blood cells total ?Disposition: Home ?Diet recommendation:  ?Discharge Diet Orders (From admission, onward)  ? ?  Start     Ordered  ? 12/15/21 0000  Diet - low sodium heart healthy       ? 12/15/21 1417  ? ?  ?  ? ?  ? ?Cardiac diet ?DISCHARGE MEDICATION: ?Allergies as of 12/15/2021   ? ?   Reactions  ? Atorvastatin Other (See Comments)  ? MYALGIA  ? Cyclobenzaprine Other (See Comments)  ? Hydrocodone-acetaminophen   ? Other reaction(s): Hallucination when taking whole pill, pt tolerates taking 1/2 pill  ? Mirtazapine   ? Other reaction(s): Hallucination  ? Oxycodone-acetaminophen Other (See Comments)  ? Paroxetine Hcl Other (See Comments)  ? Penicillins Swelling  ? Lip and orbital swelling  ? Propoxyphene Other (See Comments)  ? Trazodone Other (See Comments)  ? ?  ? ?  ?Medication List  ?  ? ?TAKE these medications   ? ?albuterol 108 (90 Base) MCG/ACT inhaler ?Commonly known as: VENTOLIN HFA ?Inhale into the lungs. ?  ?aspirin 81 MG EC tablet ?Take 1 tablet (81 mg total) by mouth daily. Swallow whole. ?  ?azelastine 0.1 % nasal spray ?Commonly known as: ASTELIN ?Place  2 sprays into both nostrils 2 (two) times daily. ?  ?cetirizine 10 MG tablet ?Commonly known as: ZYRTEC ?Take 1 tablet by mouth daily as needed for allergies. ?  ?cloNIDine 0.1 MG tablet ?Commonly known as: Catapres ?Take 1 tablet (0.1 mg total) by mouth 2 (two) times daily. ?  ?Droxia 300 MG capsule ?Generic drug: hydroxyurea ?TAKE 1 CAPSULE (300 MG TOTAL) BY MOUTH DAILY. MAY TAKE WITH FOOD TO MINIMIZE GI SIDE EFFECTS. ?  ?glucose blood test strip ?Use once daily. Use as instructed. ?  ?latanoprost 0.005 % ophthalmic solution ?Commonly known as: XALATAN ?Place 1 drop into both eyes at bedtime. ?  ?levothyroxine 88 MCG tablet ?Commonly known as: SYNTHROID ?Take 88 mcg by mouth daily. Take on an empty stomach 30 to 60  minutes before breakfast ?  ?Lotrel 5-20 MG capsule ?Generic drug: amLODipine-benazepril ?Take 1 capsule by mouth daily. ?  ?metoprolol succinate 50 MG 24 hr tablet ?Commonly known as: TOPROL-XL ?Take 1 tablet

## 2021-12-16 LAB — BPAM RBC
Blood Product Expiration Date: 202304252359
ISSUE DATE / TIME: 202303231453
Unit Type and Rh: 5100

## 2021-12-16 LAB — TYPE AND SCREEN
ABO/RH(D): O POS
Antibody Screen: NEGATIVE
Unit division: 0

## 2022-01-23 IMAGING — US US EXTREM LOW VENOUS
1 series · 14 of 24 positions shown · non-contrast
Comparison: Right lower extremity ultrasound dated 05/18/2020.

CLINICAL DATA: Hypoxia.

EXAM:
Bilateral LOWER EXTREMITY VENOUS DOPPLER ULTRASOUND
TECHNIQUE: Gray-scale sonography with compression, as well as color and duplex
ultrasound, were performed to evaluate the deep venous system(s)
from the level of the common femoral vein through the popliteal and
proximal calf veins.

[Series 1: us venous img lower bilat (dvt) · portal-venous · 14 of 57 slices shown]
[im 1/57]
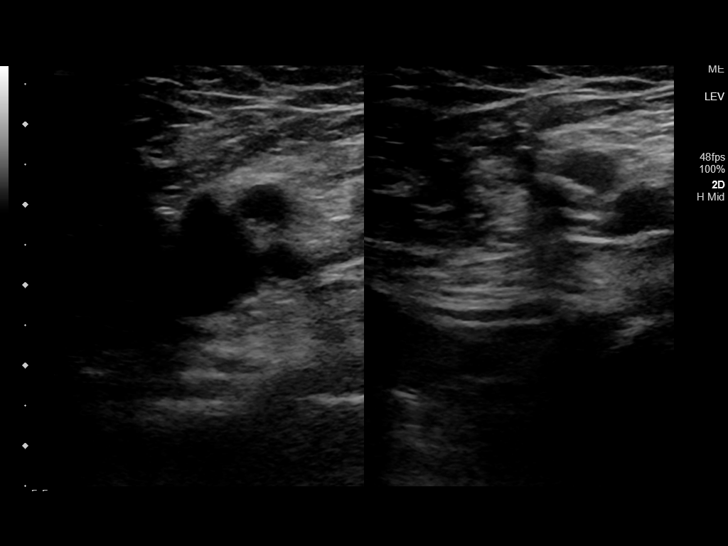
[im 5/57]
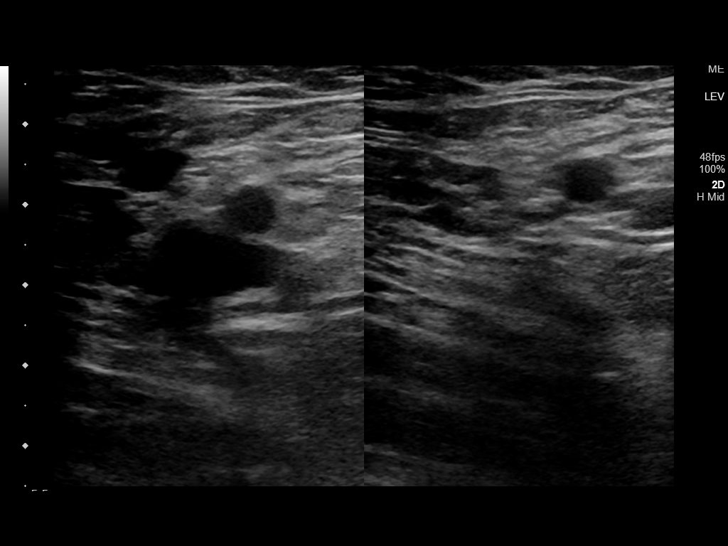
[im 10/57]
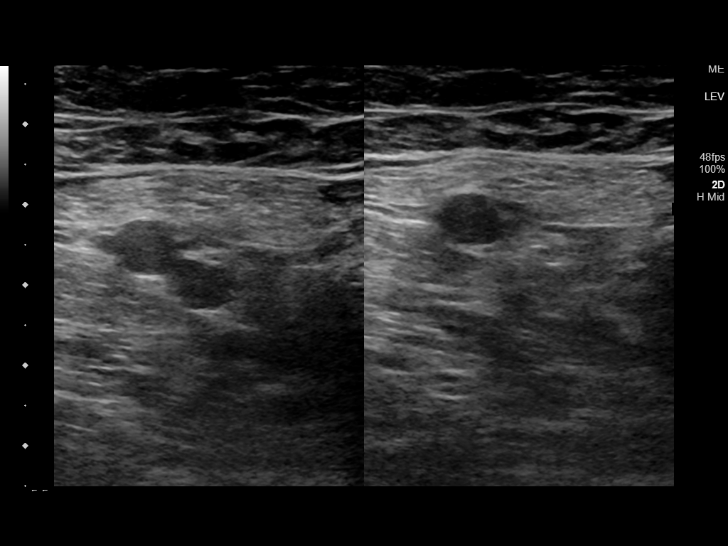
[im 15/57]
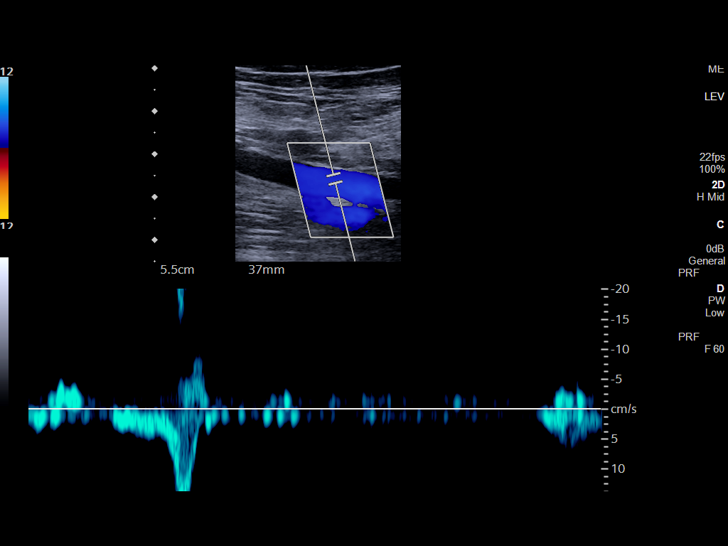
[im 18/57]
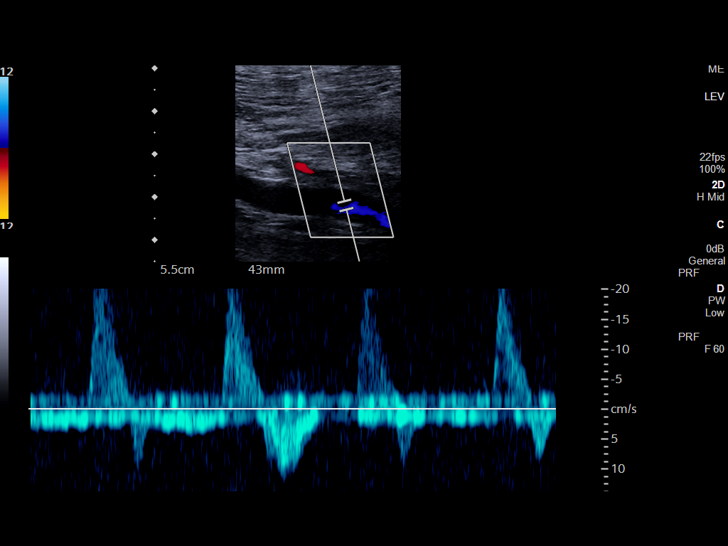
[im 22/57]
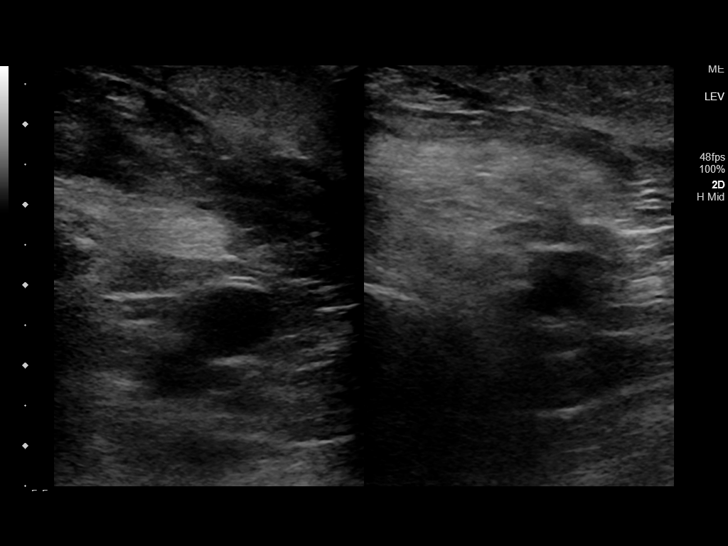
[im 27/57]
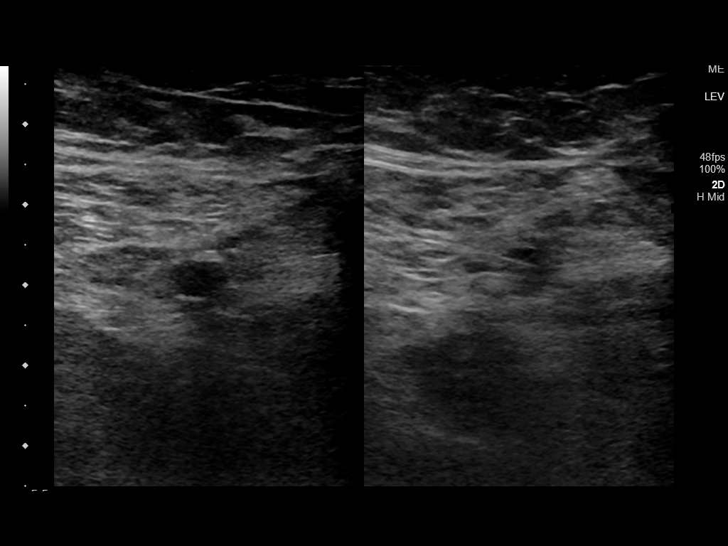
[im 30/57]
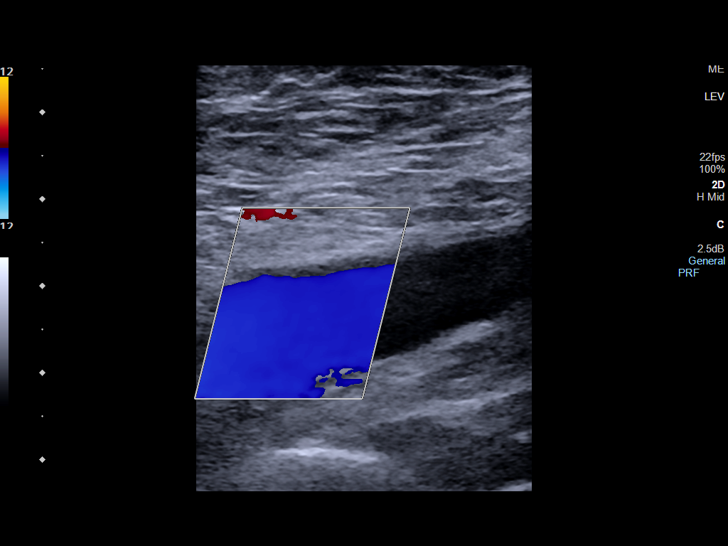
[im 35/57]
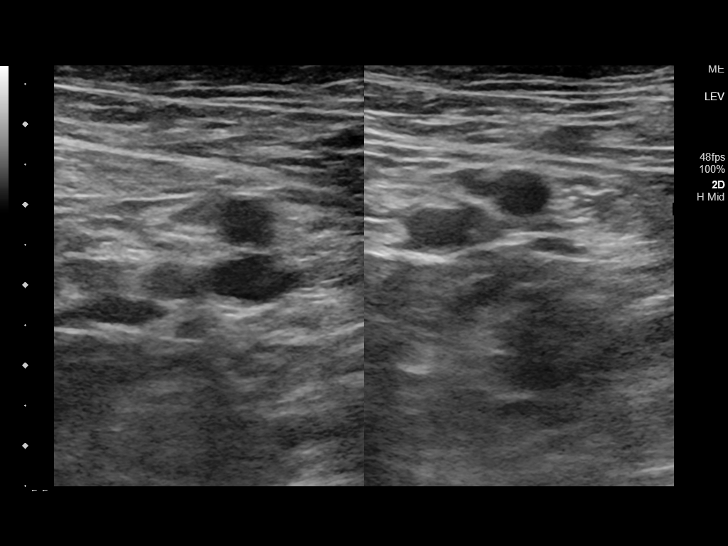
[im 39/57]
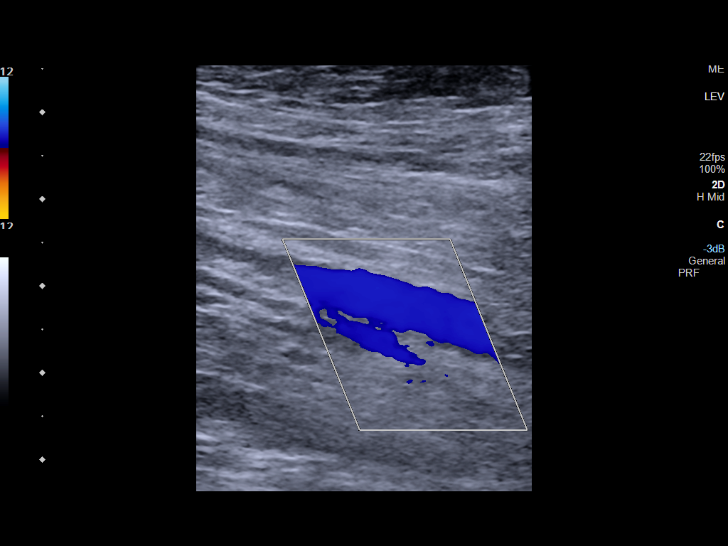
[im 44/57]
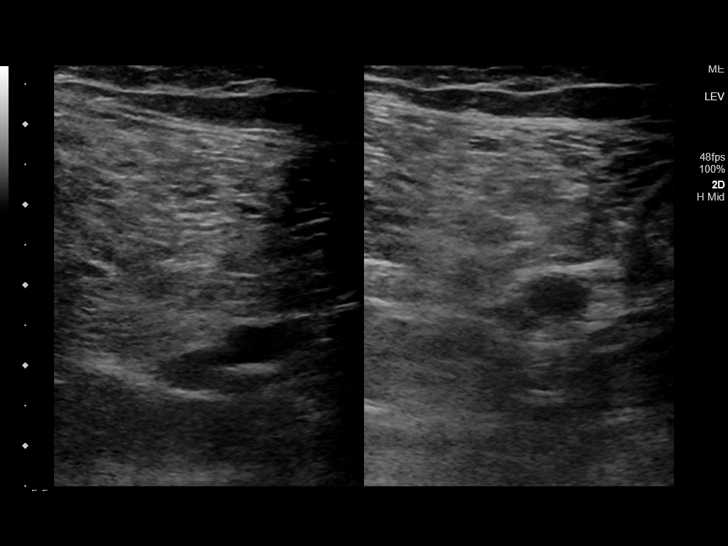
[im 47/57]
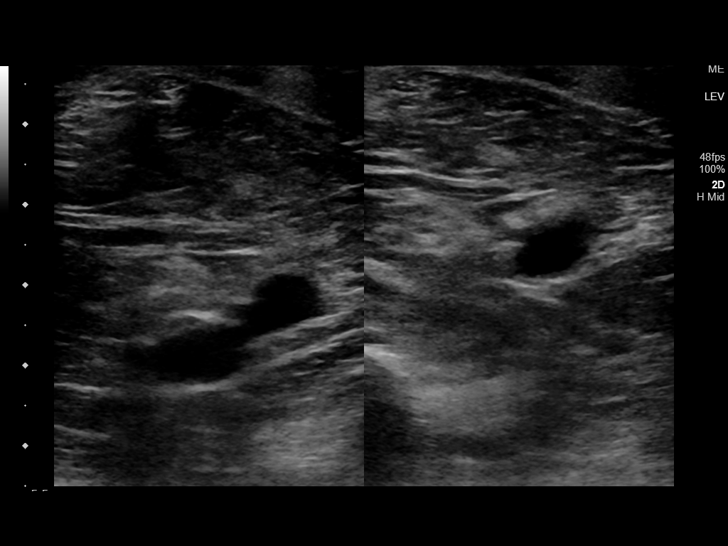
[im 52/57]
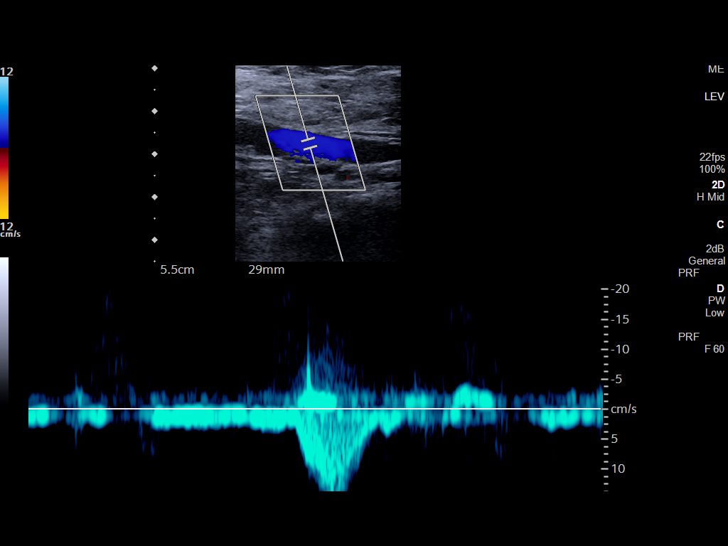
[im 57/57]
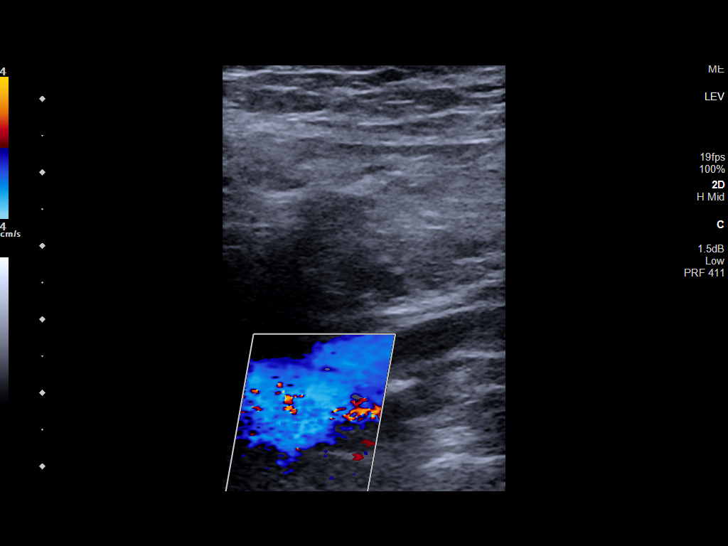

[14 of 24 positions shown; findings below may reference images not displayed]

FINDINGS: VENOUS

Normal compressibility of the common femoral, superficial femoral,
and popliteal veins, as well as the visualized calf veins.
Visualized portions of profunda femoral vein and great saphenous
vein unremarkable. No filling defects to suggest DVT on grayscale or
color Doppler imaging. Doppler waveforms show normal direction of
venous flow, normal respiratory plasticity and response to
augmentation.

Limited views of the contralateral common femoral vein are
unremarkable.

OTHER

None.

Limitations: none
IMPRESSION: Negative.

## 2022-01-23 IMAGING — US US CAROTID DUPLEX BILAT
1 series · 13 of 24 positions shown · non-contrast
Comparison: None.

CLINICAL DATA: TIA.

EXAM:
BILATERAL CAROTID DUPLEX ULTRASOUND
TECHNIQUE: Gray scale imaging, color Doppler and duplex ultrasound were
performed of bilateral carotid and vertebral arteries in the neck.

[Series 1: us carotid bilateral · 13 of 65 slices shown]
[im 1/65]
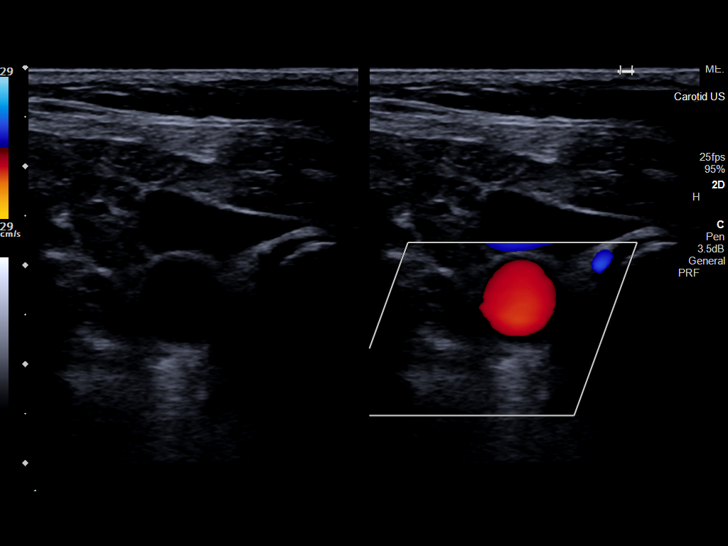
[im 6/65]
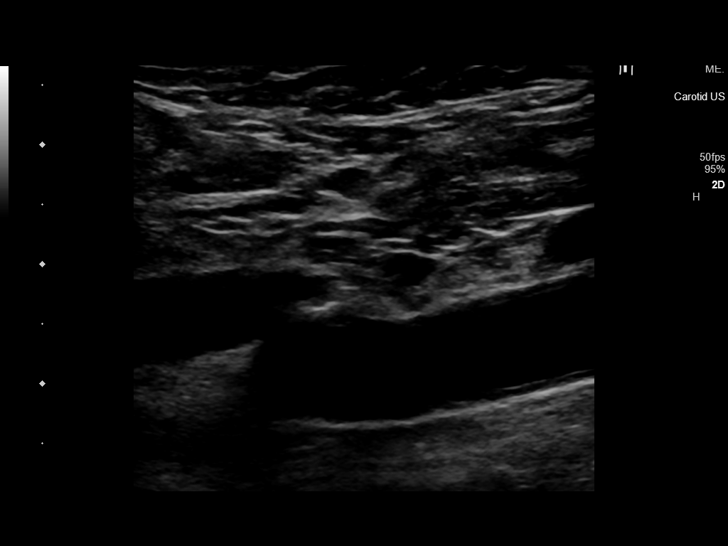
[im 12/65]
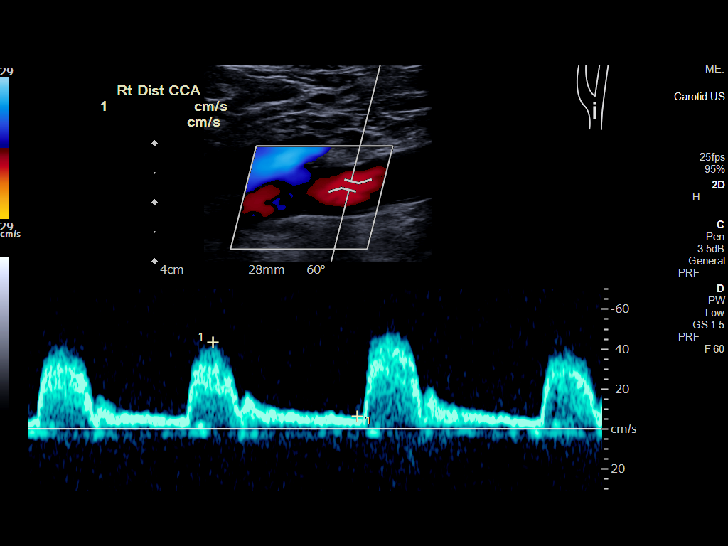
[im 17/65]
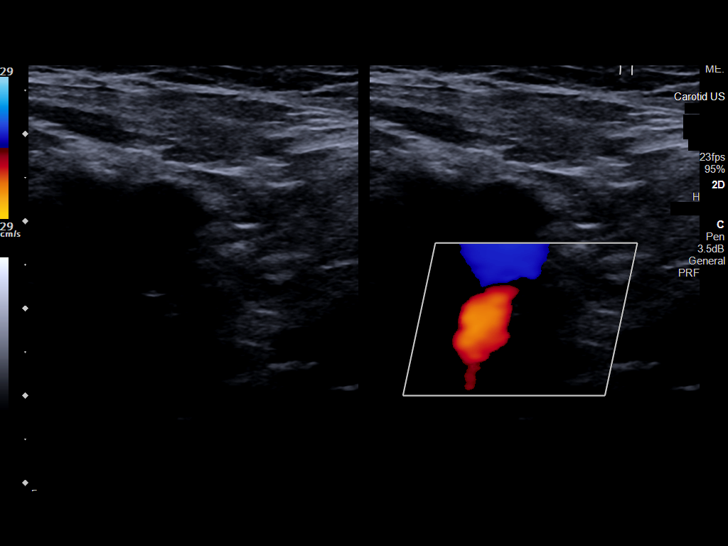
[im 23/65]
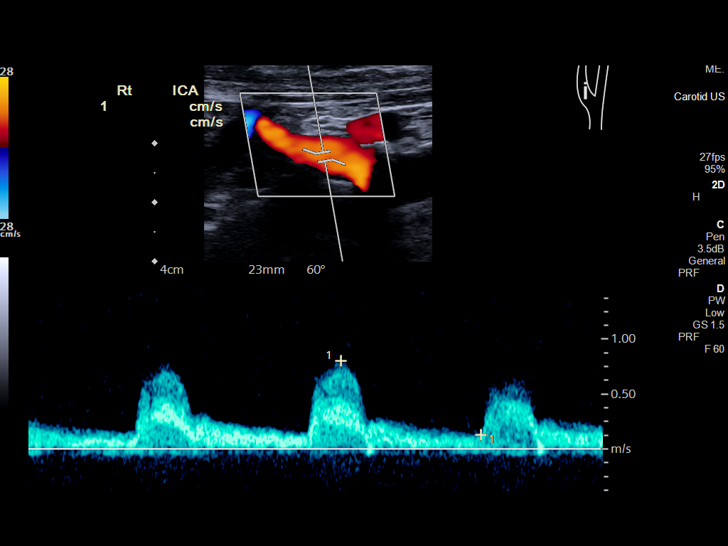
[im 28/65]
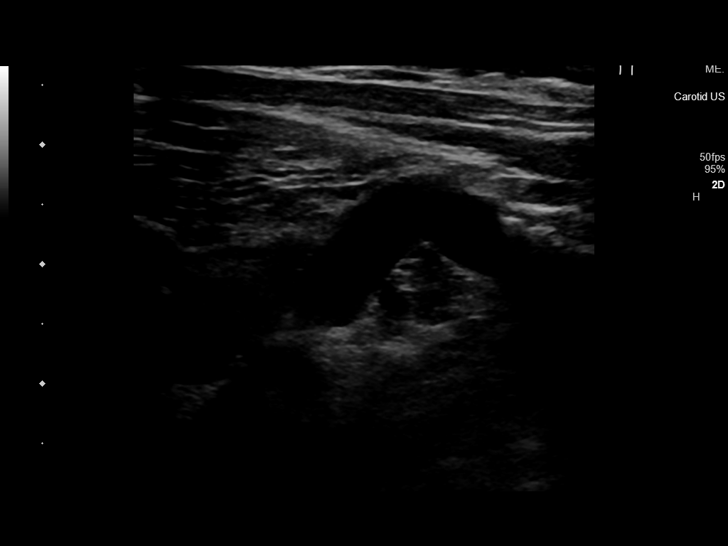
[im 34/65]
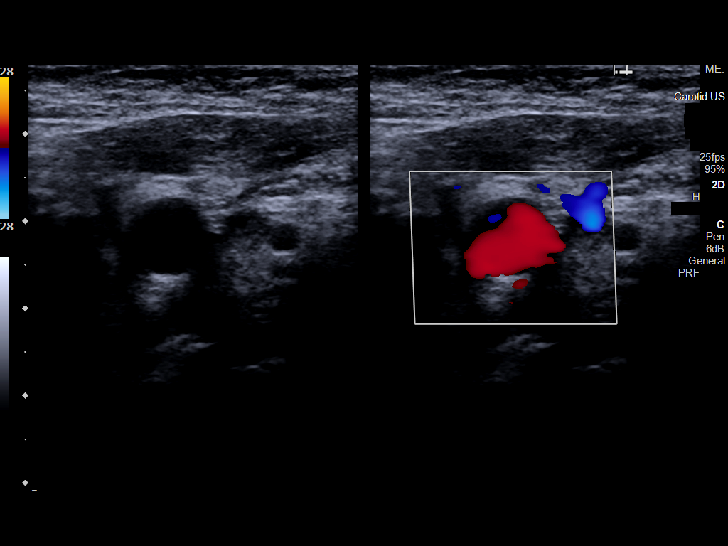
[im 37/65]
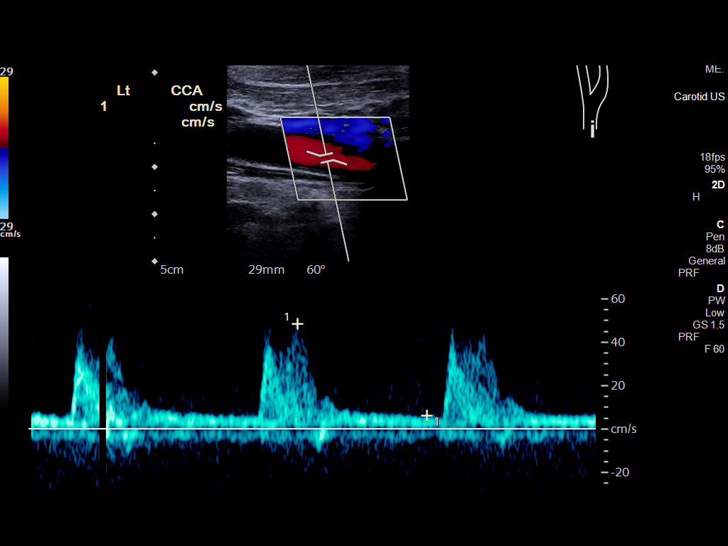
[im 42/65]
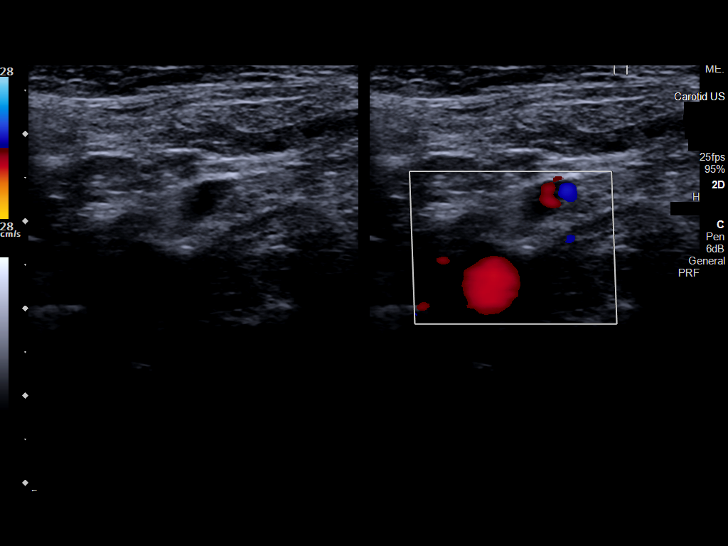
[im 48/65]
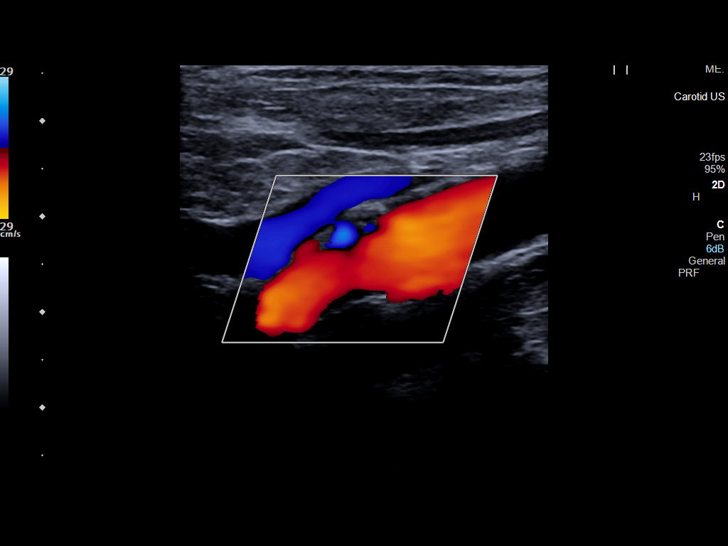
[im 53/65]
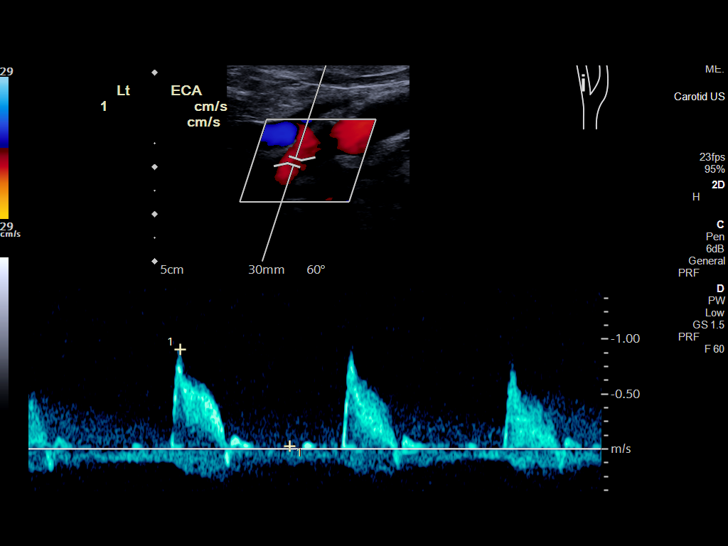
[im 59/65]
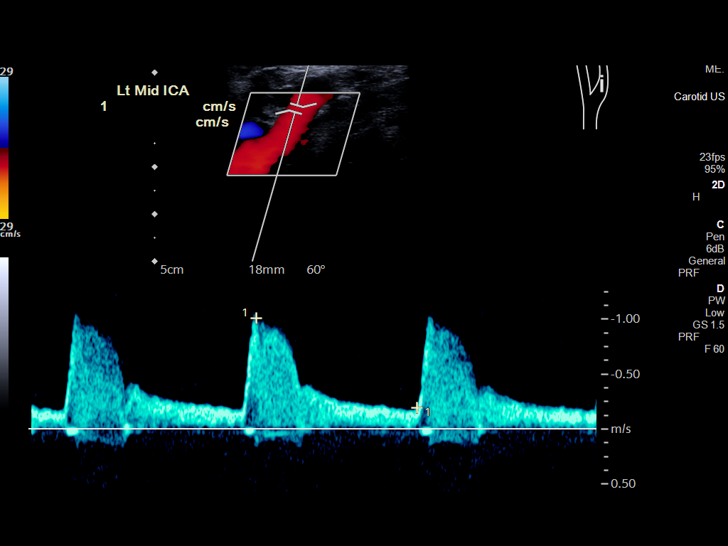
[im 65/65]
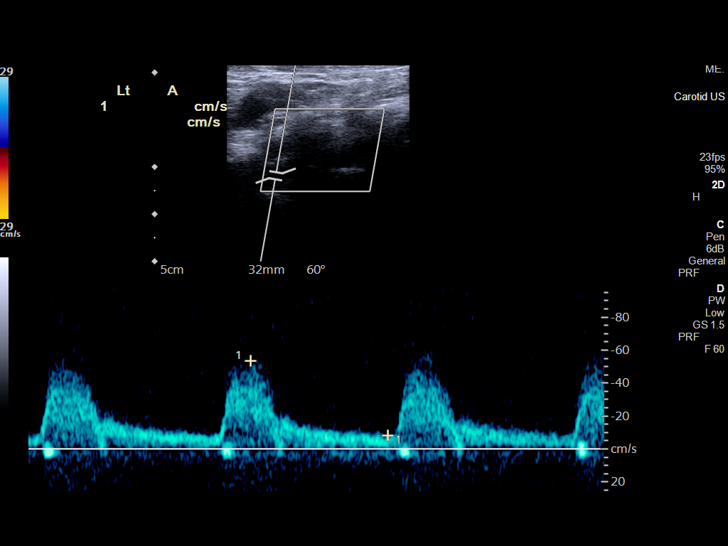

[13 of 24 positions shown; findings below may reference images not displayed]

FINDINGS: Criteria: Quantification of carotid stenosis is based on velocity
parameters that correlate the residual internal carotid diameter
with NASCET-based stenosis levels, using the diameter of the distal
internal carotid lumen as the denominator for stenosis measurement.

The following velocity measurements were obtained:

RIGHT

ICA: 80/13 cm/sec

CCA: 47/7 cm/sec

SYSTOLIC ICA/CCA RATIO:

ECA: 58 cm/sec

LEFT

ICA: 101/19 cm/sec

CCA: 57/6 cm/sec

SYSTOLIC ICA/CCA RATIO:

ECA: 91 cm/sec

RIGHT CAROTID ARTERY: Grayscale images demonstrate mild calcific
plaque formation in the carotid bulb. Normal color flow Doppler
signal with normal Doppler waveforms.

RIGHT VERTEBRAL ARTERY:  Patent without antegrade flow direction.

LEFT CAROTID ARTERY: Grayscale images demonstrate no significant
calcific plaque formation in the carotid bulb. Normal color flow
Doppler signal with normal Doppler waveforms.

LEFT VERTEBRAL ARTERY:  Patent with antegrade flow direction.
IMPRESSION: No evidence of hemodynamically significant stenosis of either right
or the left internal carotid artery.

## 2022-02-13 ENCOUNTER — Emergency Department
Admission: EM | Admit: 2022-02-13 | Discharge: 2022-02-13 | Disposition: A | Discharge status: Home / Self Care | Payer: Medicare Other | Attending: Emergency Medicine | Admitting: Emergency Medicine

## 2022-02-13 ENCOUNTER — Other Ambulatory Visit: Payer: Self-pay

## 2022-02-13 ENCOUNTER — Emergency Department: Payer: Medicare Other

## 2022-02-13 DIAGNOSIS — S8011XA Contusion of right lower leg, initial encounter: Secondary | ICD-10-CM | POA: Insufficient documentation

## 2022-02-13 DIAGNOSIS — N189 Chronic kidney disease, unspecified: Secondary | ICD-10-CM | POA: Insufficient documentation

## 2022-02-13 DIAGNOSIS — I129 Hypertensive chronic kidney disease with stage 1 through stage 4 chronic kidney disease, or unspecified chronic kidney disease: Secondary | ICD-10-CM | POA: Insufficient documentation

## 2022-02-13 DIAGNOSIS — S0003XA Contusion of scalp, initial encounter: Secondary | ICD-10-CM | POA: Diagnosis not present

## 2022-02-13 DIAGNOSIS — W19XXXA Unspecified fall, initial encounter: Secondary | ICD-10-CM

## 2022-02-13 DIAGNOSIS — S40021A Contusion of right upper arm, initial encounter: Secondary | ICD-10-CM | POA: Diagnosis not present

## 2022-02-13 DIAGNOSIS — I2510 Atherosclerotic heart disease of native coronary artery without angina pectoris: Secondary | ICD-10-CM | POA: Diagnosis not present

## 2022-02-13 DIAGNOSIS — S0990XA Unspecified injury of head, initial encounter: Secondary | ICD-10-CM | POA: Diagnosis present

## 2022-02-13 DIAGNOSIS — E1122 Type 2 diabetes mellitus with diabetic chronic kidney disease: Secondary | ICD-10-CM | POA: Diagnosis not present

## 2022-02-13 DIAGNOSIS — W06XXXA Fall from bed, initial encounter: Secondary | ICD-10-CM | POA: Diagnosis not present

## 2022-02-13 DIAGNOSIS — Y92003 Bedroom of unspecified non-institutional (private) residence as the place of occurrence of the external cause: Secondary | ICD-10-CM | POA: Insufficient documentation

## 2022-02-13 DIAGNOSIS — Z7982 Long term (current) use of aspirin: Secondary | ICD-10-CM | POA: Diagnosis not present

## 2022-02-13 MED ORDER — BACITRACIN ZINC 500 UNIT/GM EX OINT
TOPICAL_OINTMENT | Freq: Once | CUTANEOUS | Status: AC
  Filled 2022-02-13: qty 0.9

## 2022-02-13 NOTE — ED Notes (Signed)
Patient's arm dressed and bacitracin applied at this time.

## 2022-02-13 NOTE — Discharge Instructions (Addendum)
-  Continue to change dressings daily.  Apply topical antibiotic ointment to the abrasions on your right arm daily as it continues to heal.  -Look out for postconcussive symptoms.  Follow-up with your primary care provider as needed.  -Return to the emergency department anytime if the patient begins to experience any new or worsening symptoms.

## 2022-02-13 NOTE — ED Provider Notes (Signed)
[facility] Provider Note    Event Date/Time   First MD Initiated Contact with Patient 02/13/22 1102     (approximate)   History   Chief Complaint Head Injury and Fall   HPI Cassandra [name] is a 88 y.o. female, history of CAD, CKD, diabetes type 2, hypertension, GIB, CVA, presents to the emergency department for evaluation of injury sustained from mechanical fall.  Reports sitting on the edge of the bed playing on her tablet when she fell asleep, causing her to fall off the bed and hit her head on a dresser.  She states that she hit the top of her head, as well as received some abrasions and bruises along her right arm and right lower extremity.  Denies blood thinner use, but does take aspirin daily.  Denies fever/chills, chest pain, shortness of breath, abdominal pain, nausea/vomiting, urinary symptoms, vision changes, hearing changes, dizziness/lightheadedness, or vertigo.  Denies any preceding symptoms prior to the fall.  History Limitations: No limitations.        Physical Exam  Triage Vital Signs: ED Triage Vitals  Enc Vitals Group     BP 02/13/22 1056 (!) 191/70     Pulse Rate 02/13/22 1056 62     Resp 02/13/22 1056 18     Temp 02/13/22 1056 98.5 F (36.9 C)     Temp Source 02/13/22 1056 Oral     SpO2 02/13/22 1056 90 %     Weight 02/13/22 1102 139 lb 8.8 oz (63.3 kg)     Height 02/13/22 1102 5' (1.524 m)     Head Circumference --      Peak Flow --      Pain Score 02/13/22 1051 8     Pain Loc --      Pain Edu? --      Excl. in [place]? --     Most recent vital signs: Vitals:   02/13/22 1056 02/13/22 1202  BP: (!) 191/70 (!) 129/54  Pulse: 62   Resp: 18   Temp: 98.5 F (36.9 C)   SpO2: 90%     General: Awake, NAD.  Skin: Warm, dry. No rashes or lesions.  Eyes: PERRL. Conjunctivae normal.  CV: Good peripheral perfusion.  Resp: Normal effort.  Abd: Soft, non-tender. No distention.  Neuro: At baseline. No gross neurological deficits.    Focused Exam: Mild scalp hematoma present along the right parietal aspect.  No lacerations.  3 superficial abrasions appreciated along the posterior aspect of the right lower extremity.  No bony tenderness.  Normal range of motion of the right upper extremity.  Pulse, motor, sensation intact.  Ecchymosis appreciated along the midshaft tibia in the right lower extremity.  Normal range of motion.  Pulse, motor, sensation intact distally  Physical Exam    ED Results / Procedures / Treatments  Labs (all labs ordered are listed, but only abnormal results are displayed) Labs Reviewed - No data to display   EKG NA   RADIOLOGY  ED Provider Interpretation: I personally reviewed and interpreted these images.  No evidence of acute intracranial pathology or cervical spine fracture.   CT HEAD WO CONTRAST (5MM)  Result Date: 02/13/2022 CLINICAL DATA:  Fall. EXAM: CT HEAD WITHOUT CONTRAST CT CERVICAL SPINE WITHOUT CONTRAST TECHNIQUE: Multidetector CT imaging of the head and cervical spine was performed following the standard protocol without intravenous contrast. Multiplanar CT image reconstructions of the cervical spine were also generated. RADIATION DOSE REDUCTION: This exam was performed according to  the departmental dose-optimization program which includes automated exposure control, adjustment of the mA and/or kV according to patient size and/or use of iterative reconstruction technique. COMPARISON:  October 12, 2021.  May 14, 2021. FINDINGS: CT HEAD FINDINGS Brain: Mild chronic ischemic white matter disease is noted. No mass effect or midline shift is noted. Ventricular size is within normal limits. There is no evidence of mass lesion, hemorrhage or acute infarction. Vascular: No hyperdense vessel or unexpected calcification. Skull: Normal. Negative for fracture or focal lesion. Sinuses/Orbits: No acute finding. Other: Right parietal scalp hematoma is noted. CT CERVICAL SPINE FINDINGS  Alignment: Normal. Skull base and vertebrae: No acute fracture. No primary bone lesion or focal pathologic process. Soft tissues and spinal canal: No prevertebral fluid or swelling. No visible canal hematoma. Disc levels: Mild degenerative disc disease is noted at C4-5, C5-6 and C7-T1. Upper chest: Negative. Other: None. IMPRESSION: Right parietal scalp hematoma is noted. No acute intracranial abnormality seen. Mild multilevel degenerative changes are noted in the cervical spine. No acute abnormality is noted in the cervical spine. Electronically Signed   By: Marijo Conception M.D.   On: 02/13/2022 11:22   CT Cervical Spine Wo Contrast  Result Date: 02/13/2022 CLINICAL DATA:  Fall. EXAM: CT HEAD WITHOUT CONTRAST CT CERVICAL SPINE WITHOUT CONTRAST TECHNIQUE: Multidetector CT imaging of the head and cervical spine was performed following the standard protocol without intravenous contrast. Multiplanar CT image reconstructions of the cervical spine were also generated. RADIATION DOSE REDUCTION: This exam was performed according to the departmental dose-optimization program which includes automated exposure control, adjustment of the mA and/or kV according to patient size and/or use of iterative reconstruction technique. COMPARISON:  October 12, 2021.  May 14, 2021. FINDINGS: CT HEAD FINDINGS Brain: Mild chronic ischemic white matter disease is noted. No mass effect or midline shift is noted. Ventricular size is within normal limits. There is no evidence of mass lesion, hemorrhage or acute infarction. Vascular: No hyperdense vessel or unexpected calcification. Skull: Normal. Negative for fracture or focal lesion. Sinuses/Orbits: No acute finding. Other: Right parietal scalp hematoma is noted. CT CERVICAL SPINE FINDINGS Alignment: Normal. Skull base and vertebrae: No acute fracture. No primary bone lesion or focal pathologic process. Soft tissues and spinal canal: No prevertebral fluid or swelling. No visible canal  hematoma. Disc levels: Mild degenerative disc disease is noted at C4-5, C5-6 and C7-T1. Upper chest: Negative. Other: None. IMPRESSION: Right parietal scalp hematoma is noted. No acute intracranial abnormality seen. Mild multilevel degenerative changes are noted in the cervical spine. No acute abnormality is noted in the cervical spine. Electronically Signed   By: Marijo Conception M.D.   On: 02/13/2022 11:22    PROCEDURES:  Critical Care performed: N/A.  Procedures    MEDICATIONS ORDERED IN ED: Medications  bacitracin ointment ( Topical Given 02/13/22 1155)     IMPRESSION / MDM / ASSESSMENT AND PLAN / ED COURSE  I reviewed the triage vital signs and the nursing notes.                              Differential diagnosis includes, but is not limited to, concussion, epidural/subdural hematoma, cervical strain, cervical spine fracture.  ED Course Patient appears well, notably hypertensive, consistent with her history.  NAD.   Assessment/Plan Patient presents with injuries sustained from mechanical fall that occurred earlier this morning.  History not concerning for any organic pathology causing the fall.  Head  CT shows parietal scalp hematoma, otherwise no evidence of acute intracranial abnormalities.  No scalp lacerations present.  CT cervical spine shows no evidence of fracture or misalignment.  Mild abrasions appreciated in the right upper extremity, which were treated with sterile gauze wrap.  Patient is otherwise stable and has not had any significant pain.  Advised her to look out for postconcussive symptoms and follow-up with her primary care provider as needed.  We will plan to discharge.  Provided the patient with anticipatory guidance, return precautions, and educational material. Encouraged the patient to return to the emergency department at any time if they begin to experience any new or worsening symptoms. Patient expressed understanding and agreed with the plan.        FINAL CLINICAL IMPRESSION(S) / ED DIAGNOSES   Final diagnoses:  Fall, initial encounter     Rx / DC Orders   ED Discharge Orders     None        Note:  This document was prepared using Dragon voice recognition software and may include unintentional dictation errors.   Teodoro Spray, [place] 02/13/22 Lattie Corns    Blake Divine, MD 02/14/22 [phone]

## 2022-02-13 NOTE — ED Triage Notes (Signed)
Pt comes with c/o head injury. Pt states she was sitting on edge of bed and fell asleep. Pt states she then fell off bed hitting head on dresser. Pt large knot noted to top of head. Pt states bruise to right leg and pain in right shoulder. Pt denies any LOC. Pt takes aspirin daily.

## 2022-05-02 ENCOUNTER — Other Ambulatory Visit (HOSPITAL_COMMUNITY): Payer: Self-pay | Admitting: Family Medicine

## 2022-05-02 ENCOUNTER — Ambulatory Visit
Admission: RE | Admit: 2022-05-02 | Discharge: 2022-05-02 | Disposition: A | Discharge status: Home / Self Care | Payer: Medicare Other | Source: Ambulatory Visit | Attending: Family Medicine | Admitting: Family Medicine

## 2022-05-02 ENCOUNTER — Other Ambulatory Visit: Payer: Self-pay | Admitting: Family Medicine

## 2022-05-02 DIAGNOSIS — S0093XD Contusion of unspecified part of head, subsequent encounter: Secondary | ICD-10-CM | POA: Diagnosis present

## 2022-05-02 DIAGNOSIS — G44319 Acute post-traumatic headache, not intractable: Secondary | ICD-10-CM

## 2022-08-22 IMAGING — CT CT ABD-PELV W/O CM
2 of 4 series · 16 of 46 positions shown, 18 images · non-contrast
Comparison: 10/02/2021

CLINICAL DATA: Abdominal pain



[Series 2: axial st · axial · 0.78mm/px · z∈[-1158,-743]mm · 13 of 91 slices shown, 15 images]
[im 4/91  soft-tissue]
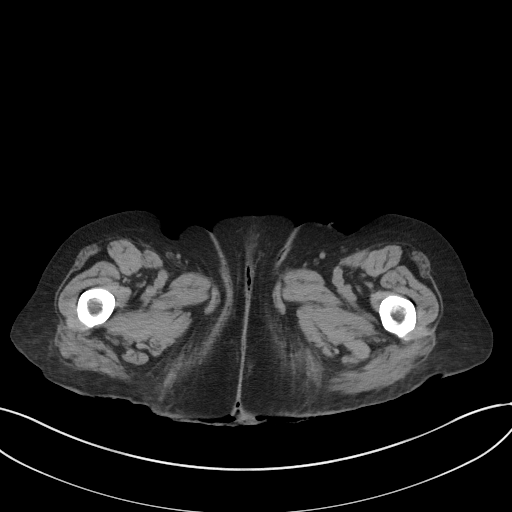
[im 4/91  bone]
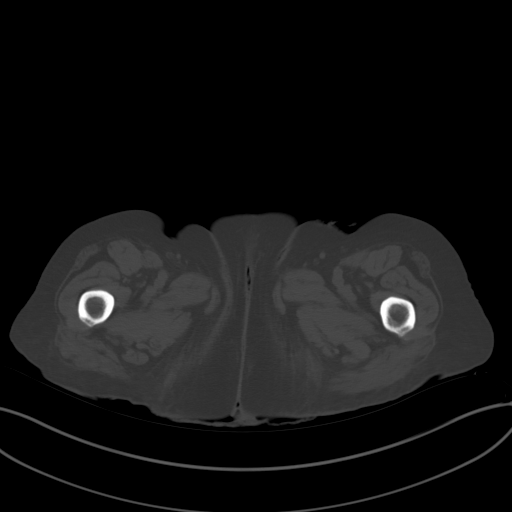
[im 12/91  soft-tissue]
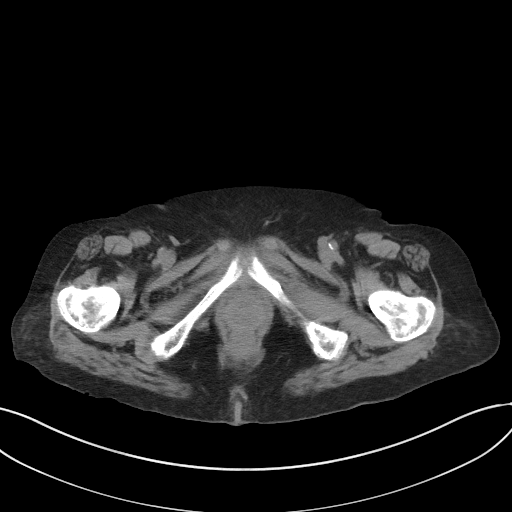
[im 19/91  soft-tissue]
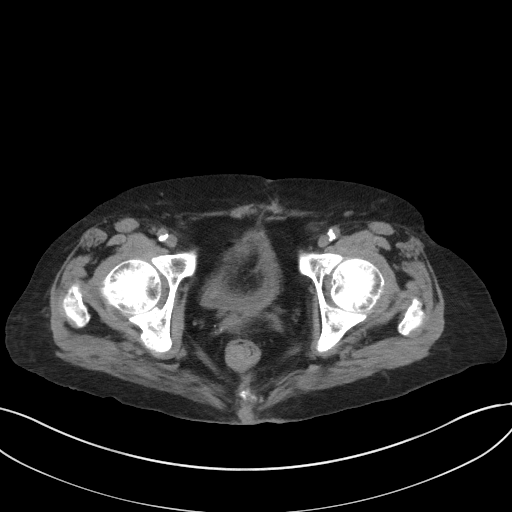
[im 27/91  soft-tissue]
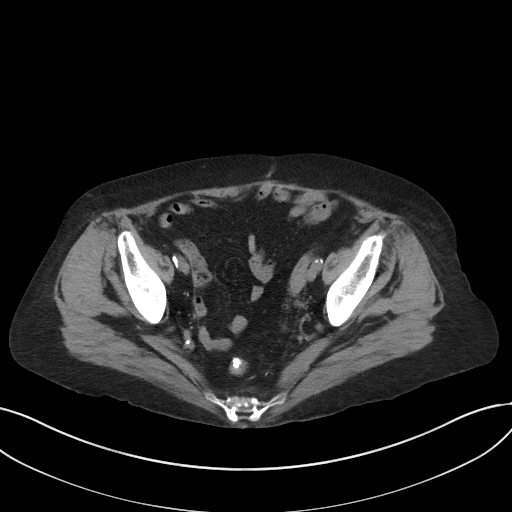
[im 31/91  soft-tissue]
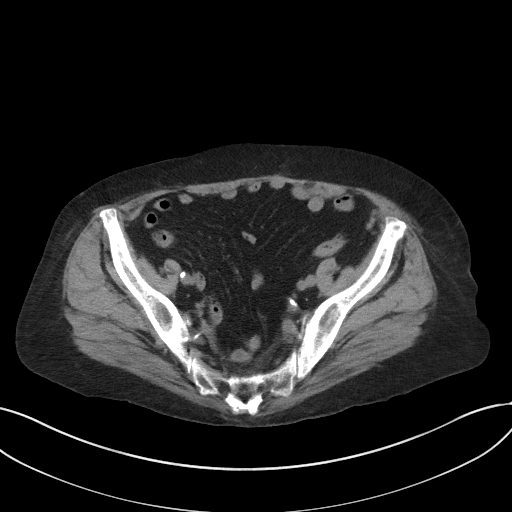
[im 38/91  soft-tissue]
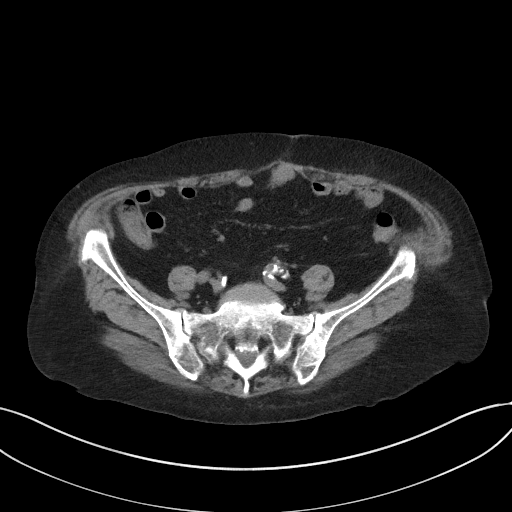
[im 46/91  soft-tissue]
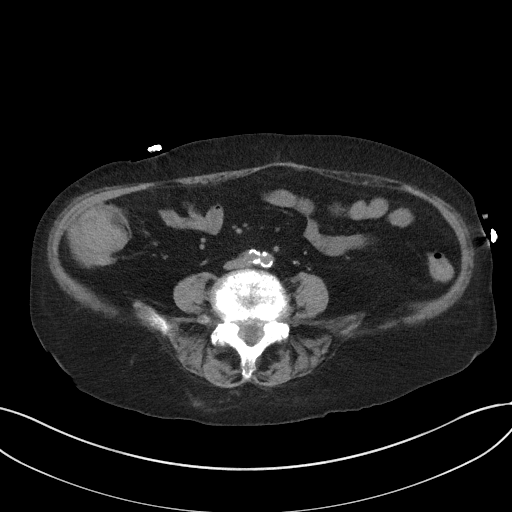
[im 53/91  soft-tissue]
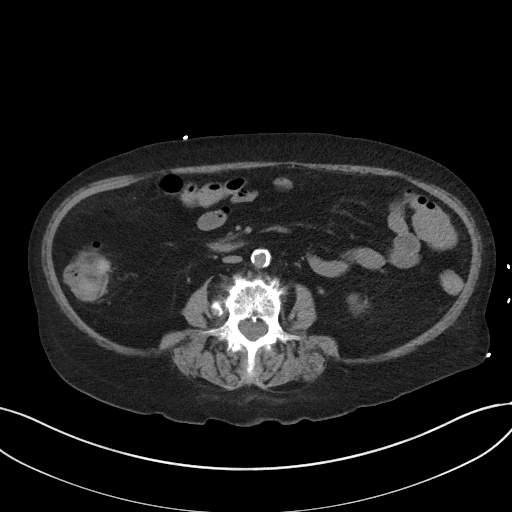
[im 61/91  soft-tissue]
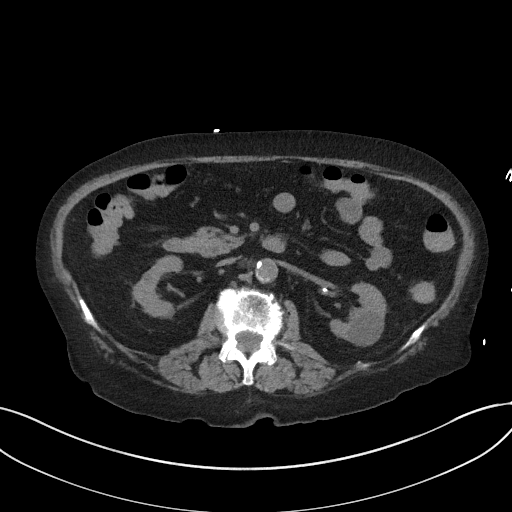
[im 61/91  bone]
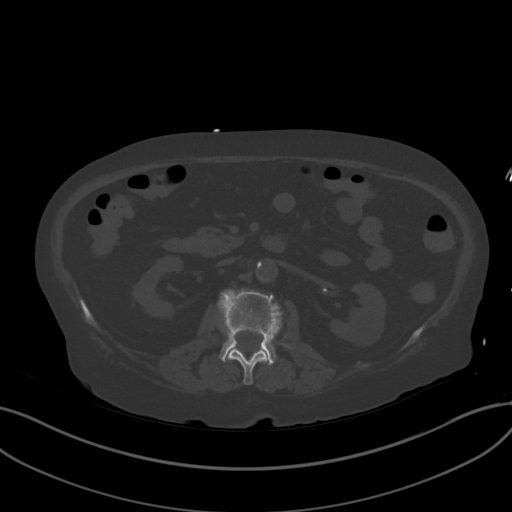
[im 64/91  soft-tissue]
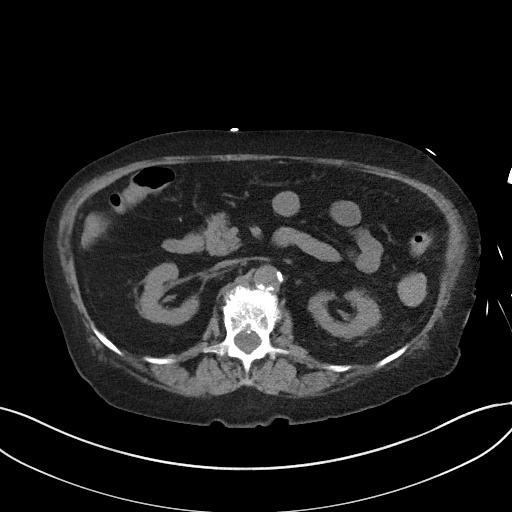
[im 72/91  soft-tissue]
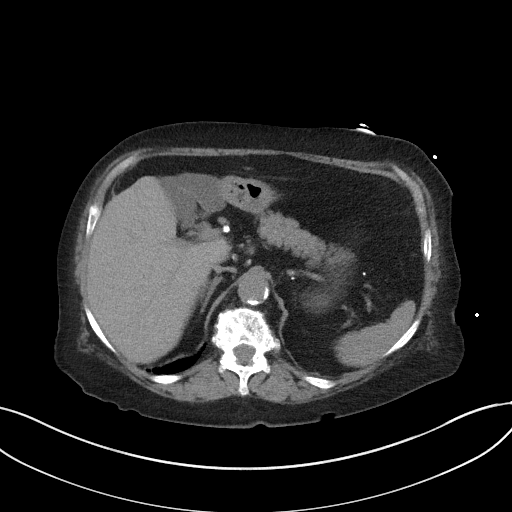
[im 79/91  soft-tissue]
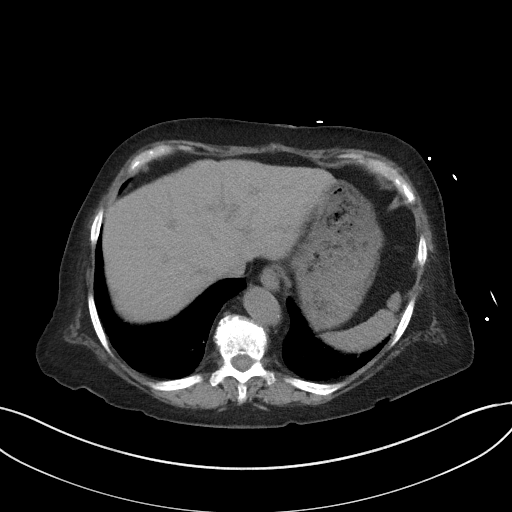
[im 87/91  soft-tissue]
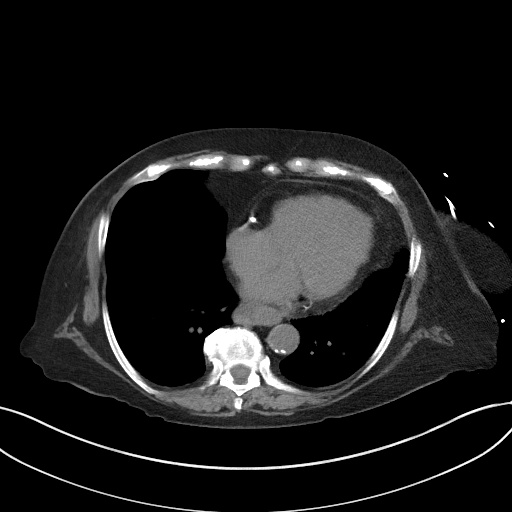

[Series 5: coronal st · coronal · 0.77mm/px · 3 of 83 slices shown]
[im 28/83  soft-tissue]
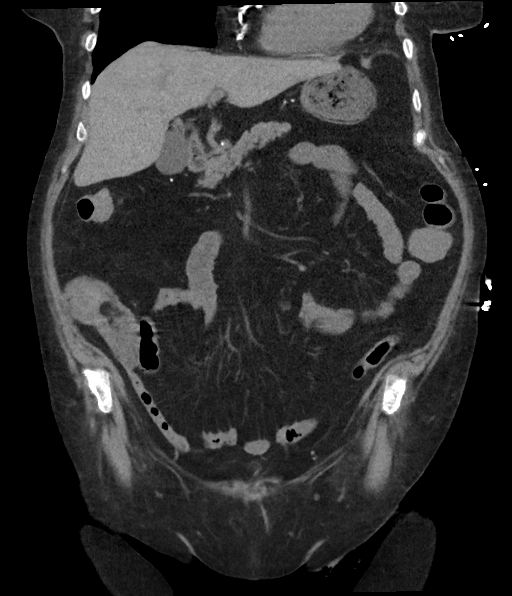
[im 37/83  soft-tissue]
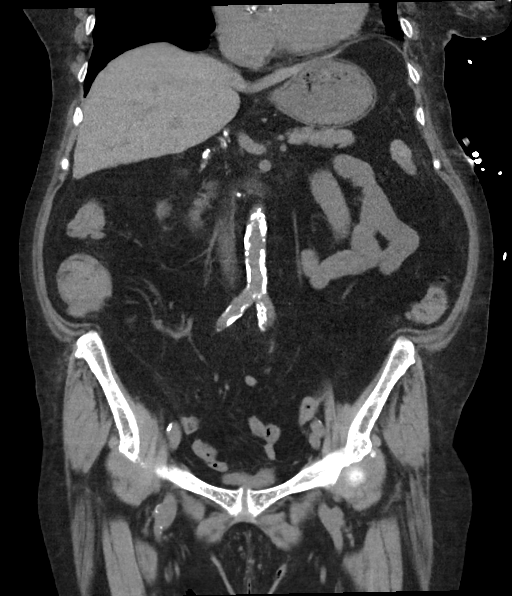
[im 46/83  soft-tissue]
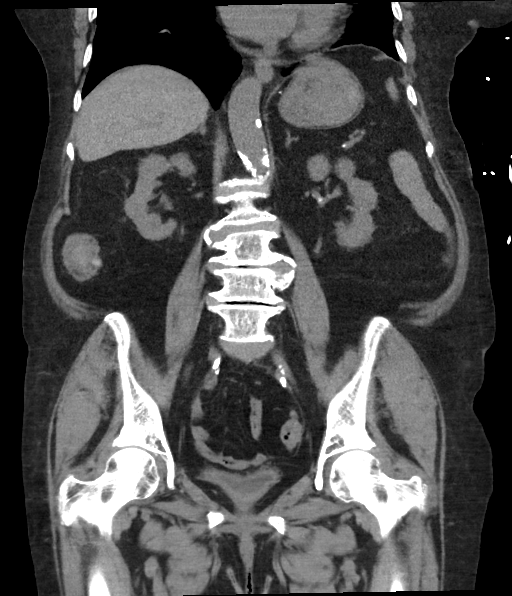

[16 of 46 positions shown; findings below may reference images not displayed]

FINDINGS: Lower Chest: Unchanged 6 mm nodule at the right lung base.

Hepatobiliary: Normal hepatic contours. No intra- or extrahepatic
biliary dilatation. There is cholelithiasis without acute
inflammation.

Pancreas: Normal pancreas. No ductal dilatation or peripancreatic
fluid collection.

Spleen: Normal.

Adrenals/Urinary Tract: The adrenal glands are normal. 2.5 cm simple
left renal cyst. No hydronephrosis. No nephroureterolithiasis. Renal
atrophy. The urinary bladder is normal for degree of distention

Stomach/Bowel: There is no hiatal hernia. Normal duodenal course and
caliber. No small bowel dilatation or inflammation. Ascending
colonic diverticulosis without acute inflammation. Rectosigmoid
anastomosis.

Vascular/Lymphatic: There is calcific atherosclerosis of the
abdominal aorta. No lymphadenopathy.

Reproductive: Status post hysterectomy. No adnexal mass.

Other: None.

Musculoskeletal: No bony spinal canal stenosis or focal osseous
abnormality.
IMPRESSION: 1. No acute abnormality of the abdomen or pelvis.
2. Cholelithiasis without acute inflammation.

Aortic Atherosclerosis ([id number]).

## 2022-12-04 ENCOUNTER — Encounter: Payer: Self-pay | Admitting: Neurology

## 2022-12-04 ENCOUNTER — Ambulatory Visit: Payer: Medicare Other | Admitting: Neurology

## 2022-12-26 ENCOUNTER — Other Ambulatory Visit (INDEPENDENT_AMBULATORY_CARE_PROVIDER_SITE_OTHER): Payer: Self-pay | Admitting: Nurse Practitioner

## 2022-12-26 DIAGNOSIS — N185 Chronic kidney disease, stage 5: Secondary | ICD-10-CM

## 2022-12-29 ENCOUNTER — Telehealth (INDEPENDENT_AMBULATORY_CARE_PROVIDER_SITE_OTHER): Payer: Self-pay

## 2022-12-29 NOTE — Telephone Encounter (Signed)
Patient called for appointment information

## 2023-01-03 ENCOUNTER — Ambulatory Visit (INDEPENDENT_AMBULATORY_CARE_PROVIDER_SITE_OTHER): Payer: Medicare Other

## 2023-01-03 ENCOUNTER — Encounter (INDEPENDENT_AMBULATORY_CARE_PROVIDER_SITE_OTHER): Payer: Self-pay | Admitting: Nurse Practitioner

## 2023-01-03 ENCOUNTER — Ambulatory Visit (INDEPENDENT_AMBULATORY_CARE_PROVIDER_SITE_OTHER): Payer: Medicare Other | Admitting: Nurse Practitioner

## 2023-01-03 VITALS — BP 163/83 | HR 72 | Resp 16 | Wt 142.2 lb

## 2023-01-03 DIAGNOSIS — N184 Chronic kidney disease, stage 4 (severe): Secondary | ICD-10-CM

## 2023-01-03 DIAGNOSIS — I10 Essential (primary) hypertension: Secondary | ICD-10-CM

## 2023-01-03 DIAGNOSIS — R600 Localized edema: Secondary | ICD-10-CM

## 2023-01-03 DIAGNOSIS — N185 Chronic kidney disease, stage 5: Secondary | ICD-10-CM

## 2023-01-03 DIAGNOSIS — E782 Mixed hyperlipidemia: Secondary | ICD-10-CM

## 2023-01-03 NOTE — Progress Notes (Signed)
Subjective:    Patient ID: Cassandra SaasFrances Labo, female    DOB: 1926-12-10, 89 y.o.   MRN: [id number] Chief Complaint  Patient presents with   New Patient (Initial Visit)    Ref Thedore MinsSingh consult vein mapp for access    Cassandra [name] is a 89 year old female who presents today as a referral from Dr. Thedore MinsSingh for evaluation for a hemodialysis access device.  The patient most recently had a GFR of 8.  She denies any uremic symptoms.  She denies any chest pain or shortness of breath at this time.  She does have some noted swelling more so on her right lower extremity but denies any open wounds or ulcerations in that instance.  As of right now she notes that her fluid volume has not yet become a problem.  Today she underwent upper extremity vein mapping which shows adequate vein diameter for creation of a left upper extremity brachiocephalic AV fistula.    Review of Systems  Cardiovascular:  Positive for leg swelling.  All other systems reviewed and are negative.      Objective:   Physical Exam Vitals reviewed.  Cardiovascular:     Rate and Rhythm: Normal rate.     Pulses: Normal pulses.  Pulmonary:     Effort: Pulmonary effort is normal.  Musculoskeletal:     Right lower leg: Edema present.  Neurological:     Mental Status: She is alert and oriented to person, place, and time.     Gait: Gait abnormal.  Psychiatric:        Mood and Affect: Mood normal.        Behavior: Behavior normal.        Thought Content: Thought content normal.        Judgment: Judgment normal.     BP (!) 163/83 (BP Location: Left Arm)   Pulse 72   Resp 16   Wt 142 lb 3.2 oz (64.5 kg)   BMI 27.77 kg/m   Past Medical History:  Diagnosis Date   Arthritis    osteoarthritis in hands, knees ( R torn cartilage, L meniscus removed)    CAD (coronary artery disease)    Diabetes mellitus without complication    Glaucoma    Hyperlipidemia    Hypertension    Hypothyroidism     Social History   Socioeconomic  History   Marital status: Married    Spouse name: Not on file   Number of children: Not on file   Years of education: Not on file   Highest education level: Not on file  Occupational History   Not on file  Tobacco Use   Smoking status: Former    Types: Cigarettes    Quit date: 01/21/1980    Years since quitting: 42.9   Smokeless tobacco: Never  Substance and Sexual Activity   Alcohol use: Yes    Alcohol/week: 0.0 standard drinks of alcohol    Comment: occasionally   Drug use: No   Sexual activity: Never  Other Topics Concern   Not on file  Social History Narrative   Not on file   Social Determinants of Health   Financial Resource Strain: Not on file  Food Insecurity: Not on file  Transportation Needs: Not on file  Physical Activity: Not on file  Stress: Not on file  Social Connections: Not on file  Intimate Partner Violence: Not on file    Past Surgical History:  Procedure Laterality Date   ABDOMINAL HYSTERECTOMY  CORONARY ANGIOPLASTY WITH STENT PLACEMENT     FOOT SURGERY Bilateral    KNEE ARTHROSCOPY W/ MENISCECTOMY Left    Also has R knee injury (torn cartilage)     Family History  Problem Relation Age of Onset   Hypertension Mother    Hypertension Father    Early death Father    Heart disease Father     Allergies  Allergen Reactions   Atorvastatin Other (See Comments)    MYALGIA   Cyclobenzaprine Other (See Comments)   Hydrocodone-Acetaminophen     Other reaction(s): Hallucination when taking whole pill, pt tolerates taking 1/2 pill   Mirtazapine     Other reaction(s): Hallucination   Oxycodone-Acetaminophen Other (See Comments)   Paroxetine Hcl Other (See Comments)   Penicillins Swelling    Lip and orbital swelling   Propoxyphene Other (See Comments)   Trazodone Other (See Comments)       Latest Ref Rng & Units 12/15/2021    4:36 AM 12/14/2021    5:00 AM 12/13/2021    4:49 AM  CBC  WBC 4.0 - 10.5 K/uL 13.3  16.2  19.9   Hemoglobin 12.0 -  15.0 g/dL 7.7  8.2  8.4   Hematocrit 36.0 - 46.0 % 25.1  26.0  27.1   Platelets 150 - 400 K/uL 425  434  473       CMP     Component Value Date/Time   NA 135 12/15/2021 0436   NA 137 12/07/2012 1641   K 4.0 12/15/2021 0436   K 4.2 12/07/2012 1641   CL 109 12/15/2021 0436   CL 106 12/07/2012 1641   CO2 18 (L) 12/15/2021 0436   CO2 27 12/07/2012 1641   GLUCOSE 75 12/15/2021 0436   GLUCOSE 75 12/07/2012 1641   BUN 25 (H) 12/15/2021 0436   BUN 18 12/07/2012 1641   CREATININE 3.04 (H) 12/15/2021 0436   CREATININE 1.13 12/07/2012 1641   CALCIUM 7.7 (L) 12/15/2021 0436   CALCIUM 8.9 12/07/2012 1641   PROT 7.3 12/10/2021 2213   PROT 8.6 (H) 12/07/2012 1641   ALBUMIN 2.9 (L) 12/10/2021 2213   ALBUMIN 3.2 (L) 12/07/2012 1641   AST 27 12/10/2021 2213   AST 37 12/07/2012 1641   ALT 9 12/10/2021 2213   ALT 33 12/07/2012 1641   ALKPHOS 47 12/10/2021 2213   ALKPHOS 72 12/07/2012 1641   BILITOT 0.7 12/10/2021 2213   BILITOT 0.4 12/07/2012 1641   GFRNONAA 14 (L) 12/15/2021 0436   GFRNONAA 45 (L) 12/07/2012 1641   GFRAA 25 (L) 03/30/2020 1026   GFRAA 52 (L) 12/07/2012 1641     No results found.     Assessment & Plan:   1. CKD (chronic kidney disease) stage 4, GFR 15-29 ml/min I had a long discussion with the patient regarding her results and next steps.  At this time the patient is unsure if she wishes to proceed with dialysis access placement.  Given her age, she is unsure whether she wishes to proceed with dialysis at all at this time.  She wants to wait until her visit with her primary care provider in July before she makes any final decisions.  I discussed with the patient that based on her ultrasound results she has adequate vein diameter size for placement of an AV fistula.  Placement of this fistula could be done but it would not cause a significant affecting her quality of life and if she ultimately decided that she did not want to  do dialysis or if it took an extended  timeframe before she decided to undergo dialysis, the fistula would be okay to sit in place without use.  Even with discussion the patient does not wish to proceed with placement at this time.  Will plan on having the patient return after her follow-up with Dr. Hyacinth Meeker to reevaluate and rediscuss access placement options as well as her choice as to whether she wishes to move forward with placement.  She is advised that if she changes her mind or if her status changes to contact our office and we will be happy to have her follow-up sooner.  2. Essential hypertension Continue antihypertensive medications as already ordered, these medications have been reviewed and there are no changes at this time.  Patient's blood pressure is somewhat elevated today.  Patient advised to continue to monitor at home if remains elevated to discuss with Dr. Hyacinth Meeker if medication adjustments are necessary.  3. Hyperlipidemia, mixed Continue statin as ordered and reviewed, no changes at this time  4. Leg edema I discussed with patient that the swelling is the likely being driven by some of her kidney dysfunction.  Patient is advised to elevate her lower extremities when not active in addition to utilizing medical grade compression daily.   Current Outpatient Medications on File Prior to Visit  Medication Sig Dispense Refill   albuterol (VENTOLIN HFA) 108 (90 Base) MCG/ACT inhaler Inhale into the lungs.     aspirin EC 81 MG EC tablet Take 1 tablet (81 mg total) by mouth daily. Swallow whole. 30 tablet 11   azelastine (ASTELIN) 0.1 % nasal spray Place 2 sprays into both nostrils 2 (two) times daily.     cetirizine (ZYRTEC) 10 MG tablet Take 1 tablet by mouth daily as needed for allergies.     DROXIA 300 MG capsule TAKE 1 CAPSULE (300 MG TOTAL) BY MOUTH DAILY. MAY TAKE WITH FOOD TO MINIMIZE GI SIDE EFFECTS. 30 capsule 0   glucose blood test strip Use once daily. Use as instructed.     latanoprost (XALATAN) 0.005 % ophthalmic  solution Place 1 drop into both eyes at bedtime.     levothyroxine (SYNTHROID) 88 MCG tablet Take 88 mcg by mouth daily. Take on an empty stomach 30 to 60 minutes before breakfast     LOTREL 5-20 MG capsule Take 1 capsule by mouth daily.     metoprolol succinate (TOPROL-XL) 50 MG 24 hr tablet Take 1 tablet by mouth daily.     naloxone (NARCAN) nasal spray 4 mg/0.1 mL Place 1 spray into the nose once as needed. Place 1 spray (4 mg total) into one nostril once as needed (if not breathing.) for up to 1 dose     polyethylene glycol (MIRALAX / GLYCOLAX) 17 g packet Take 17 g by mouth daily. 14 each 0   rOPINIRole (REQUIP) 0.5 MG tablet Take 0.5 mg by mouth at bedtime.     rosuvastatin (CRESTOR) 20 MG tablet Take 20 mg by mouth daily.     timolol (TIMOPTIC-XR) 0.5 % ophthalmic gel-forming Place 1 drop into both eyes every morning.     cloNIDine (CATAPRES) 0.1 MG tablet Take 1 tablet (0.1 mg total) by mouth 2 (two) times daily. 60 tablet 0   pantoprazole (PROTONIX) 40 MG tablet Take 1 tablet (40 mg total) by mouth daily. 30 tablet 2   No current facility-administered medications on file prior to visit.    There are no Patient Instructions on file for this  visit. No follow-ups on file.   Kris Hartmann, NP

## 2023-03-05 ENCOUNTER — Encounter
Admission: EM | Disposition: A | Discharge status: Skilled Nursing Facility | Payer: Self-pay | Source: Home / Self Care | Attending: Osteopathic Medicine

## 2023-03-05 ENCOUNTER — Inpatient Hospital Stay
Admission: EM | Admit: 2023-03-05 | Discharge: 2023-03-13 | DRG: 673 | Disposition: A | Discharge status: Skilled Nursing Facility | Payer: Medicare Other | Attending: Osteopathic Medicine | Admitting: Osteopathic Medicine

## 2023-03-05 ENCOUNTER — Encounter: Payer: Self-pay | Admitting: Emergency Medicine

## 2023-03-05 ENCOUNTER — Emergency Department: Payer: Medicare Other

## 2023-03-05 ENCOUNTER — Inpatient Hospital Stay: Payer: Medicare Other

## 2023-03-05 ENCOUNTER — Other Ambulatory Visit: Payer: Self-pay

## 2023-03-05 DIAGNOSIS — N2581 Secondary hyperparathyroidism of renal origin: Secondary | ICD-10-CM | POA: Diagnosis present

## 2023-03-05 DIAGNOSIS — E1151 Type 2 diabetes mellitus with diabetic peripheral angiopathy without gangrene: Secondary | ICD-10-CM | POA: Diagnosis present

## 2023-03-05 DIAGNOSIS — J9811 Atelectasis: Secondary | ICD-10-CM | POA: Diagnosis present

## 2023-03-05 DIAGNOSIS — E039 Hypothyroidism, unspecified: Secondary | ICD-10-CM | POA: Diagnosis present

## 2023-03-05 DIAGNOSIS — N19 Unspecified kidney failure: Secondary | ICD-10-CM | POA: Diagnosis present

## 2023-03-05 DIAGNOSIS — D631 Anemia in chronic kidney disease: Secondary | ICD-10-CM | POA: Diagnosis present

## 2023-03-05 DIAGNOSIS — F419 Anxiety disorder, unspecified: Secondary | ICD-10-CM | POA: Diagnosis present

## 2023-03-05 DIAGNOSIS — E1122 Type 2 diabetes mellitus with diabetic chronic kidney disease: Secondary | ICD-10-CM | POA: Diagnosis present

## 2023-03-05 DIAGNOSIS — N186 End stage renal disease: Secondary | ICD-10-CM | POA: Diagnosis present

## 2023-03-05 DIAGNOSIS — E785 Hyperlipidemia, unspecified: Secondary | ICD-10-CM | POA: Diagnosis present

## 2023-03-05 DIAGNOSIS — Z1152 Encounter for screening for COVID-19: Secondary | ICD-10-CM | POA: Diagnosis not present

## 2023-03-05 DIAGNOSIS — E1140 Type 2 diabetes mellitus with diabetic neuropathy, unspecified: Secondary | ICD-10-CM | POA: Diagnosis present

## 2023-03-05 DIAGNOSIS — E8721 Acute metabolic acidosis: Secondary | ICD-10-CM | POA: Diagnosis present

## 2023-03-05 DIAGNOSIS — H409 Unspecified glaucoma: Secondary | ICD-10-CM | POA: Diagnosis present

## 2023-03-05 DIAGNOSIS — E875 Hyperkalemia: Secondary | ICD-10-CM | POA: Insufficient documentation

## 2023-03-05 DIAGNOSIS — E119 Type 2 diabetes mellitus without complications: Secondary | ICD-10-CM

## 2023-03-05 DIAGNOSIS — N184 Chronic kidney disease, stage 4 (severe): Secondary | ICD-10-CM | POA: Diagnosis not present

## 2023-03-05 DIAGNOSIS — J9600 Acute respiratory failure, unspecified whether with hypoxia or hypercapnia: Secondary | ICD-10-CM | POA: Diagnosis not present

## 2023-03-05 DIAGNOSIS — Z7982 Long term (current) use of aspirin: Secondary | ICD-10-CM

## 2023-03-05 DIAGNOSIS — Z87891 Personal history of nicotine dependence: Secondary | ICD-10-CM

## 2023-03-05 DIAGNOSIS — E559 Vitamin D deficiency, unspecified: Secondary | ICD-10-CM | POA: Diagnosis present

## 2023-03-05 DIAGNOSIS — Z955 Presence of coronary angioplasty implant and graft: Secondary | ICD-10-CM

## 2023-03-05 DIAGNOSIS — J810 Acute pulmonary edema: Secondary | ICD-10-CM | POA: Diagnosis present

## 2023-03-05 DIAGNOSIS — N179 Acute kidney failure, unspecified: Secondary | ICD-10-CM | POA: Diagnosis present

## 2023-03-05 DIAGNOSIS — Z992 Dependence on renal dialysis: Secondary | ICD-10-CM

## 2023-03-05 DIAGNOSIS — G2581 Restless legs syndrome: Secondary | ICD-10-CM | POA: Diagnosis present

## 2023-03-05 DIAGNOSIS — Z79899 Other long term (current) drug therapy: Secondary | ICD-10-CM

## 2023-03-05 DIAGNOSIS — Z66 Do not resuscitate: Secondary | ICD-10-CM | POA: Diagnosis present

## 2023-03-05 DIAGNOSIS — J189 Pneumonia, unspecified organism: Secondary | ICD-10-CM | POA: Diagnosis present

## 2023-03-05 DIAGNOSIS — Z8249 Family history of ischemic heart disease and other diseases of the circulatory system: Secondary | ICD-10-CM

## 2023-03-05 DIAGNOSIS — I120 Hypertensive chronic kidney disease with stage 5 chronic kidney disease or end stage renal disease: Secondary | ICD-10-CM | POA: Diagnosis present

## 2023-03-05 DIAGNOSIS — Z79890 Hormone replacement therapy (postmenopausal): Secondary | ICD-10-CM

## 2023-03-05 DIAGNOSIS — Z880 Allergy status to penicillin: Secondary | ICD-10-CM

## 2023-03-05 DIAGNOSIS — K219 Gastro-esophageal reflux disease without esophagitis: Secondary | ICD-10-CM | POA: Diagnosis present

## 2023-03-05 DIAGNOSIS — I2510 Atherosclerotic heart disease of native coronary artery without angina pectoris: Secondary | ICD-10-CM | POA: Diagnosis present

## 2023-03-05 DIAGNOSIS — D45 Polycythemia vera: Secondary | ICD-10-CM | POA: Diagnosis present

## 2023-03-05 DIAGNOSIS — Z888 Allergy status to other drugs, medicaments and biological substances status: Secondary | ICD-10-CM

## 2023-03-05 DIAGNOSIS — I10 Essential (primary) hypertension: Secondary | ICD-10-CM | POA: Diagnosis present

## 2023-03-05 DIAGNOSIS — E8770 Fluid overload, unspecified: Secondary | ICD-10-CM | POA: Diagnosis present

## 2023-03-05 DIAGNOSIS — Z885 Allergy status to narcotic agent status: Secondary | ICD-10-CM

## 2023-03-05 DIAGNOSIS — G629 Polyneuropathy, unspecified: Secondary | ICD-10-CM

## 2023-03-05 DIAGNOSIS — J9601 Acute respiratory failure with hypoxia: Secondary | ICD-10-CM | POA: Diagnosis present

## 2023-03-05 DIAGNOSIS — Z8673 Personal history of transient ischemic attack (TIA), and cerebral infarction without residual deficits: Secondary | ICD-10-CM

## 2023-03-05 HISTORY — PX: TEMPORARY DIALYSIS CATHETER: CATH118312

## 2023-03-05 LAB — CBC WITH DIFFERENTIAL/PLATELET
Abs Immature Granulocytes: 0.41 10*3/uL — ABNORMAL HIGH (ref 0.00–0.07)
Basophils Absolute: 0.1 10*3/uL (ref 0.0–0.1)
Basophils Relative: 1 %
Eosinophils Absolute: 0.1 10*3/uL (ref 0.0–0.5)
Eosinophils Relative: 1 %
HCT: 40.1 % (ref 36.0–46.0)
Hemoglobin: 12.3 g/dL (ref 12.0–15.0)
Immature Granulocytes: 2 %
Lymphocytes Relative: 7 %
Lymphs Abs: 1.4 10*3/uL (ref 0.7–4.0)
MCH: 26.1 pg (ref 26.0–34.0)
MCHC: 30.7 g/dL (ref 30.0–36.0)
MCV: 85 fL (ref 80.0–100.0)
Monocytes Absolute: 2.9 10*3/uL — ABNORMAL HIGH (ref 0.1–1.0)
Monocytes Relative: 14 %
Neutro Abs: 16 10*3/uL — ABNORMAL HIGH (ref 1.7–7.7)
Neutrophils Relative %: 75 %
Platelets: 690 10*3/uL — ABNORMAL HIGH (ref 150–400)
RBC: 4.72 MIL/uL (ref 3.87–5.11)
RDW: 18.4 % — ABNORMAL HIGH (ref 11.5–15.5)
WBC: 21 10*3/uL — ABNORMAL HIGH (ref 4.0–10.5)
nRBC: 0 % (ref 0.0–0.2)

## 2023-03-05 LAB — HEPATIC FUNCTION PANEL
ALT: 15 U/L (ref 0–44)
AST: 23 U/L (ref 15–41)
Albumin: 2.6 g/dL — ABNORMAL LOW (ref 3.5–5.0)
Alkaline Phosphatase: 47 U/L (ref 38–126)
Bilirubin, Direct: <0.1 mg/dL (ref 0.0–0.2)
Total Bilirubin: 0.7 mg/dL (ref 0.3–1.2)
Total Protein: 6.9 g/dL (ref 6.5–8.1)

## 2023-03-05 LAB — RESPIRATORY PANEL BY PCR

## 2023-03-05 LAB — BASIC METABOLIC PANEL
Anion gap: 12 (ref 5–15)
BUN: 77 mg/dL — ABNORMAL HIGH (ref 8–23)
CO2: 16 mmol/L — ABNORMAL LOW (ref 22–32)
Calcium: 7.7 mg/dL — ABNORMAL LOW (ref 8.9–10.3)
Chloride: 109 mmol/L (ref 98–111)
Creatinine, Ser: 7.71 mg/dL — ABNORMAL HIGH (ref 0.44–1.00)
GFR, Estimated: 5 mL/min — ABNORMAL LOW (ref >60)
Glucose, Bld: 90 mg/dL (ref 70–99)
Potassium: 5.8 mmol/L — ABNORMAL HIGH (ref 3.5–5.1)
Sodium: 137 mmol/L (ref 135–145)

## 2023-03-05 LAB — TROPONIN I (HIGH SENSITIVITY)
Troponin I (High Sensitivity): 48 ng/L — ABNORMAL HIGH (ref <18)
Troponin I (High Sensitivity): 59 ng/L — ABNORMAL HIGH (ref <18)

## 2023-03-05 LAB — BLOOD GAS, VENOUS
Acid-base deficit: 8.4 mmol/L — ABNORMAL HIGH (ref 0.0–2.0)
Bicarbonate: 17.9 mmol/L — ABNORMAL LOW (ref 20.0–28.0)
O2 Saturation: 82.8 %
Patient temperature: 37
pCO2, Ven: 39 mmHg — ABNORMAL LOW (ref 44–60)
pH, Ven: 7.27 (ref 7.25–7.43)
pO2, Ven: 55 mmHg — ABNORMAL HIGH (ref 32–45)

## 2023-03-05 LAB — HEPATITIS B SURFACE ANTIGEN: Hepatitis B Surface Ag: NONREACTIVE

## 2023-03-05 LAB — BRAIN NATRIURETIC PEPTIDE: B Natriuretic Peptide: 792.3 pg/mL — ABNORMAL HIGH (ref 0.0–100.0)

## 2023-03-05 LAB — SARS CORONAVIRUS 2 BY RT PCR: SARS Coronavirus 2 by RT PCR: NEGATIVE

## 2023-03-05 LAB — PROCALCITONIN: Procalcitonin: 0.8 ng/mL

## 2023-03-05 LAB — HEPATITIS B CORE ANTIBODY, TOTAL: Hep B Core Total Ab: NONREACTIVE

## 2023-03-05 SURGERY — TEMPORARY DIALYSIS CATHETER
Anesthesia: Moderate Sedation

## 2023-03-05 MED ORDER — ALTEPLASE 2 MG IJ SOLR: 2.0000 mg | Freq: Once | INTRAMUSCULAR | Status: DC | PRN

## 2023-03-05 MED ORDER — IPRATROPIUM-ALBUTEROL 0.5-2.5 (3) MG/3ML IN SOLN
3.0000 mL | Freq: Once | RESPIRATORY_TRACT | Status: AC
  Administered 2023-03-05: 3 mL via RESPIRATORY_TRACT
  Filled 2023-03-05: qty 3

## 2023-03-05 MED ORDER — ANTICOAGULANT SODIUM CITRATE 4% (200MG/5ML) IV SOLN: 5.0000 mL | Status: DC | PRN

## 2023-03-05 MED ORDER — ROSUVASTATIN CALCIUM 10 MG PO TABS
20.0000 mg | ORAL_TABLET | Freq: Every day | ORAL | Status: DC
  Administered 2023-03-06 – 2023-03-11 (×6): 20 mg via ORAL
  Filled 2023-03-05: qty 1
  Filled 2023-03-05 (×3): qty 2
  Filled 2023-03-05: qty 1
  Filled 2023-03-05: qty 2

## 2023-03-05 MED ORDER — PENTAFLUOROPROP-TETRAFLUOROETH EX AERO: 1.0000 | INHALATION_SPRAY | CUTANEOUS | Status: DC | PRN

## 2023-03-05 MED ORDER — ALBUTEROL SULFATE (2.5 MG/3ML) 0.083% IN NEBU: 2.5000 mg | INHALATION_SOLUTION | RESPIRATORY_TRACT | Status: DC | PRN

## 2023-03-05 MED ORDER — ONDANSETRON HCL 4 MG PO TABS: 4.0000 mg | ORAL_TABLET | Freq: Four times a day (QID) | ORAL | Status: DC | PRN

## 2023-03-05 MED ORDER — ONDANSETRON HCL 4 MG/2ML IJ SOLN: 4.0000 mg | Freq: Four times a day (QID) | INTRAMUSCULAR | Status: DC | PRN

## 2023-03-05 MED ORDER — CHLORHEXIDINE GLUCONATE CLOTH 2 % EX PADS
6.0000 | MEDICATED_PAD | Freq: Every day | CUTANEOUS | Status: DC
  Administered 2023-03-08 – 2023-03-13 (×6): 6 via TOPICAL
  Filled 2023-03-05 (×2): qty 6

## 2023-03-05 MED ORDER — SODIUM CHLORIDE 0.9 % IV SOLN
2.0000 g | INTRAVENOUS | Status: AC
  Administered 2023-03-06 – 2023-03-09 (×4): 2 g via INTRAVENOUS
  Filled 2023-03-05 (×4): qty 20

## 2023-03-05 MED ORDER — ROPINIROLE HCL 1 MG PO TABS
0.5000 mg | ORAL_TABLET | Freq: Every day | ORAL | Status: DC
  Administered 2023-03-05 – 2023-03-12 (×8): 0.5 mg via ORAL
  Filled 2023-03-05: qty 1
  Filled 2023-03-05 (×2): qty 2
  Filled 2023-03-05 (×5): qty 1

## 2023-03-05 MED ORDER — FUROSEMIDE 10 MG/ML IJ SOLN
80.0000 mg | Freq: Once | INTRAMUSCULAR | Status: AC
  Administered 2023-03-05: 80 mg via INTRAVENOUS
  Filled 2023-03-05: qty 8

## 2023-03-05 MED ORDER — FUROSEMIDE 10 MG/ML IJ SOLN
8.0000 mg/h | INTRAVENOUS | Status: DC
  Administered 2023-03-05: 8 mg/h via INTRAVENOUS
  Filled 2023-03-05: qty 20

## 2023-03-05 MED ORDER — HEPARIN SODIUM (PORCINE) 1000 UNIT/ML DIALYSIS
1000.0000 [IU] | INTRAMUSCULAR | Status: DC | PRN
  Administered 2023-03-06 – 2023-03-10 (×2): 1000 [IU]
  Filled 2023-03-05: qty 1

## 2023-03-05 MED ORDER — LIDOCAINE-PRILOCAINE 2.5-2.5 % EX CREA: 1.0000 | TOPICAL_CREAM | CUTANEOUS | Status: DC | PRN

## 2023-03-05 MED ORDER — SODIUM CHLORIDE 0.9 % IV SOLN
500.0000 mg | INTRAVENOUS | Status: AC
  Administered 2023-03-06 – 2023-03-10 (×5): 500 mg via INTRAVENOUS
  Filled 2023-03-05 (×5): qty 5

## 2023-03-05 MED ORDER — HEPARIN SODIUM (PORCINE) 5000 UNIT/ML IJ SOLN
5000.0000 [IU] | Freq: Three times a day (TID) | INTRAMUSCULAR | Status: DC
  Administered 2023-03-05 – 2023-03-13 (×23): 5000 [IU] via SUBCUTANEOUS
  Filled 2023-03-05 (×25): qty 1

## 2023-03-05 MED ORDER — LIDOCAINE HCL (PF) 1 % IJ SOLN: 5.0000 mL | INTRAMUSCULAR | Status: DC | PRN

## 2023-03-05 MED ORDER — TIMOLOL MALEATE 0.5 % OP SOLN
1.0000 [drp] | Freq: Every morning | OPHTHALMIC | Status: DC
  Administered 2023-03-06 – 2023-03-13 (×6): 1 [drp] via OPHTHALMIC
  Filled 2023-03-05 (×2): qty 5

## 2023-03-05 MED ORDER — METOPROLOL SUCCINATE ER 50 MG PO TB24: 50.0000 mg | ORAL_TABLET | Freq: Every day | ORAL | Status: DC

## 2023-03-05 MED ORDER — SODIUM CHLORIDE 0.9 % IV SOLN
500.0000 mg | Freq: Once | INTRAVENOUS | Status: AC
  Administered 2023-03-05: 500 mg via INTRAVENOUS
  Filled 2023-03-05: qty 5

## 2023-03-05 MED ORDER — SODIUM CHLORIDE 0.9 % IV SOLN
2.0000 g | Freq: Once | INTRAVENOUS | Status: AC
  Administered 2023-03-05: 2 g via INTRAVENOUS
  Filled 2023-03-05: qty 20

## 2023-03-05 MED ORDER — LEVOTHYROXINE SODIUM 88 MCG PO TABS
88.0000 ug | ORAL_TABLET | Freq: Every day | ORAL | Status: DC
  Administered 2023-03-06 – 2023-03-13 (×8): 88 ug via ORAL
  Filled 2023-03-05 (×8): qty 1

## 2023-03-05 SURGICAL SUPPLY — 5 items
BIOPATCH RED 1 DISK 7.0 (GAUZE/BANDAGES/DRESSINGS) IMPLANT
COVER PROBE ULTRASOUND 5X96 (MISCELLANEOUS) IMPLANT
KIT DIALYSIS CATH TRI 30X13 (CATHETERS) IMPLANT
PACK ANGIOGRAPHY (CUSTOM PROCEDURE TRAY) ×1 IMPLANT
TRAY LACERAT/PLASTIC (MISCELLANEOUS) IMPLANT

## 2023-03-05 NOTE — Op Note (Signed)
  OPERATIVE NOTE   PROCEDURE: Ultrasound guidance for vascular access right femoral vein Placement of a 30 cm triple lumen dialysis catheter right femoral vein  PRE-OPERATIVE DIAGNOSIS: 1. Acute on chronic renal failure requiring dialysis  POST-OPERATIVE DIAGNOSIS: Same  SURGEON: Festus Barren, MD  ASSISTANT(S): None  ANESTHESIA: local  ESTIMATED BLOOD LOSS: Minimal   FINDING(S): 1.  None  SPECIMEN(S):  None  INDICATIONS:    Patient is a 89 y.o.female who presents with acute on chronic renal failure now requiring dialysis.  Risks and benefits were discussed, and informed consent was obtained..  DESCRIPTION: After obtaining full informed written consent, the patient was laid flat in the bed.  The right groin was sterilely prepped and draped in a sterile surgical field was created. The right femoral vein was visualized with ultrasound and found to be widely patent. It was then accessed under direct guidance without difficulty with a Seldinger needle and a permanent image was recorded. A J-wire was then placed. After skin nick and dilatation, a 30 cm triple lumen dialysis catheter was placed over the wire and the wire was removed. The lumens withdrew dark red nonpulsatile blood and flushed easily with sterile saline. The catheter was secured to the skin with 3 nylon sutures. Sterile dressing was placed.  COMPLICATIONS: None  CONDITION: Stable  Festus Barren 03/05/2023 3:22 PM  This note was created with Dragon Medical transcription system. Any errors in dictation are purely unintentional.

## 2023-03-05 NOTE — ED Provider Notes (Signed)
[facility] Provider Note    Event Date/Time   First MD Initiated Contact with Patient 03/05/23 1128     (approximate)   History   Shortness of Breath   HPI  Cassandra [name] is a 89 y.o. female  here with SOB.  Pt has reportedly had worsening SOB for the past week or so. She has been coughing, SOB and weak and now has had difficulty even getting around the house. She has had decreased appetite. She says she feels "awful." Denies any fevers. Husband was finally able to convince her to come in today. No med changes.      Physical Exam   Triage Vital Signs: ED Triage Vitals  Enc Vitals Group     BP 03/05/23 1131 123/81     Pulse Rate 03/05/23 1127 81     Resp 03/05/23 1127 (!) 23     Temp 03/05/23 1131 98.5 F (36.9 C)     Temp Source 03/05/23 1131 Oral     SpO2 03/05/23 1123 (!) 68 %     Weight 03/05/23 1128 153 lb 1.6 oz (69.4 kg)     Height --      Head Circumference --      Peak Flow --      Pain Score 03/05/23 1127 0     Pain Loc --      Pain Edu? --      Excl. in GC? --     Most recent vital signs: Vitals:   03/05/23 1914 03/05/23 1930  BP:  126/72  Pulse:  65  Resp:  18  Temp: 97.7 F (36.5 C)   SpO2:  93%     General: Awake, no distress.  CV:  Good peripheral perfusion. Regular rate and rhythm. Resp:  Tachypneic, scant expiratory wheezes. Abd:  No distention. No tenderness. Other:  Mild edema.   ED Results / Procedures / Treatments   Labs (all labs ordered are listed, but only abnormal results are displayed) Labs Reviewed  CBC WITH DIFFERENTIAL/PLATELET - Abnormal; Notable for the following components:      Result Value   WBC 21.0 (*)    RDW 18.4 (*)    Platelets 690 (*)    Neutro Abs 16.0 (*)    Monocytes Absolute 2.9 (*)    Abs Immature Granulocytes 0.41 (*)    All other components within normal limits  BASIC METABOLIC PANEL - Abnormal; Notable for the following components:   Potassium 5.8 (*)    CO2 16 (*)     BUN 77 (*)    Creatinine, Ser 7.71 (*)    Calcium 7.7 (*)    GFR, Estimated 5 (*)    All other components within normal limits  BLOOD GAS, VENOUS - Abnormal; Notable for the following components:   pCO2, Ven 39 (*)    pO2, Ven 55 (*)    Bicarbonate 17.9 (*)    Acid-base deficit 8.4 (*)    All other components within normal limits  BRAIN NATRIURETIC PEPTIDE - Abnormal; Notable for the following components:   B Natriuretic Peptide 792.3 (*)    All other components within normal limits  HEPATIC FUNCTION PANEL - Abnormal; Notable for the following components:   Albumin 2.6 (*)    All other components within normal limits  TROPONIN I (HIGH SENSITIVITY) - Abnormal; Notable for the following components:   Troponin I (High Sensitivity) 48 (*)    All other components within normal limits  TROPONIN  I (HIGH SENSITIVITY) - Abnormal; Notable for the following components:   Troponin I (High Sensitivity) 59 (*)    All other components within normal limits  SARS CORONAVIRUS 2 BY RT PCR  RESPIRATORY PANEL BY PCR  CULTURE, BLOOD (SINGLE)  EXPECTORATED SPUTUM ASSESSMENT W GRAM STAIN, RFLX TO RESP C  PROCALCITONIN  HEPATITIS B SURFACE ANTIGEN  HEPATITIS B CORE ANTIBODY, TOTAL  HEPATITIS B SURFACE ANTIBODY, QUANTITATIVE  STREP PNEUMONIAE URINARY ANTIGEN  LEGIONELLA PNEUMOPHILA SEROGP 1 UR AG  CBC  COMPREHENSIVE METABOLIC PANEL     EKG Normal sinus rhythm, VR 82. PR 161, QRS 90, QTc 408. Noa cute ST elevations or depressions.   RADIOLOGY CXR: Cardiomegaly, interstitial edema, hazy opacities c/f PNA   I also independently reviewed and agree with radiologist interpretations.   PROCEDURES:  Critical Care performed: Yes, see critical care procedure note(s)  .Critical Care  Performed by: Shaune Pollack, MD Authorized by: Shaune Pollack, MD   Critical care provider statement:    Critical care time (minutes):  30   Critical care was time spent personally by me on the following  activities:  Development of treatment plan with patient or surrogate, discussions with consultants, evaluation of patient's response to treatment, examination of patient, ordering and review of laboratory studies, ordering and review of radiographic studies, ordering and performing treatments and interventions, pulse oximetry, re-evaluation of patient's condition and review of old charts     MEDICATIONS ORDERED IN ED: Medications  furosemide (LASIX) 200 mg in dextrose 5 % 100 mL (2 mg/mL) infusion (8 mg/hr Intravenous New Bag/Given 03/05/23 1548)  albuterol (PROVENTIL) (2.5 MG/3ML) 0.083% nebulizer solution 2.5 mg (has no administration in time range)  levothyroxine (SYNTHROID) tablet 88 mcg (has no administration in time range)  rOPINIRole (REQUIP) tablet 0.5 mg (has no administration in time range)  rosuvastatin (CRESTOR) tablet 20 mg (0 mg Oral Hold 03/05/23 1530)  timolol (TIMOPTIC) 0.5 % ophthalmic solution 1 drop (has no administration in time range)  heparin injection 5,000 Units (5,000 Units Subcutaneous Given 03/05/23 1551)  ondansetron (ZOFRAN) tablet 4 mg (has no administration in time range)    Or  ondansetron (ZOFRAN) injection 4 mg (has no administration in time range)  cefTRIAXone (ROCEPHIN) 2 g in sodium chloride 0.9 % 100 mL IVPB (has no administration in time range)  azithromycin (ZITHROMAX) 500 mg in sodium chloride 0.9 % 250 mL IVPB (has no administration in time range)  Chlorhexidine Gluconate Cloth 2 % PADS 6 each (has no administration in time range)  pentafluoroprop-tetrafluoroeth (GEBAUERS) aerosol 1 Application (has no administration in time range)  lidocaine (PF) (XYLOCAINE) 1 % injection 5 mL (has no administration in time range)  lidocaine-prilocaine (EMLA) cream 1 Application (has no administration in time range)  heparin injection 1,000 Units (has no administration in time range)  anticoagulant sodium citrate solution 5 mL (has no administration in time range)   alteplase (CATHFLO ACTIVASE) injection 2 mg (has no administration in time range)  ipratropium-albuterol (DUONEB) 0.5-2.5 (3) MG/3ML nebulizer solution 3 mL (3 mLs Nebulization Given 03/05/23 1157)  ipratropium-albuterol (DUONEB) 0.5-2.5 (3) MG/3ML nebulizer solution 3 mL (3 mLs Nebulization Given 03/05/23 1156)  cefTRIAXone (ROCEPHIN) 2 g in sodium chloride 0.9 % 100 mL IVPB (0 g Intravenous Stopped 03/05/23 1432)  azithromycin (ZITHROMAX) 500 mg in sodium chloride 0.9 % 250 mL IVPB (0 mg Intravenous Stopped 03/05/23 1703)  furosemide (LASIX) injection 80 mg (80 mg Intravenous Given 03/05/23 1424)     IMPRESSION / MDM / ASSESSMENT AND  PLAN / ED COURSE  I reviewed the triage vital signs and the nursing notes.                              Differential diagnosis includes, but is not limited to, CHF, PNA, anemia, URI, ACS  Patient's presentation is most consistent with acute presentation with potential threat to life or bodily function.  The patient is on the cardiac monitor to evaluate for evidence of arrhythmia and/or significant heart rate changes  89 yo F with PMHx as above here with SOB, weakness, fatigue. Labs, imaging reviewed. Pt has likely acute on chronic now ESRD in setting of CKD, with significant metabolic acidosis and developing pulm edema. Reviewed prior records and Cr markedly above baseline, and she was being considered for starting HD as an outpt anyways. Moreover, pt also has leukocytosis, cough, and CXR concerning for PNA. This may be asymmetric edema but will cover empirically, admit for dialysis and further management.    FINAL CLINICAL IMPRESSION(S) / ED DIAGNOSES   Final diagnoses:  ESRD (end stage renal disease) (HCC)  Acute pulmonary edema (HCC)  Community acquired pneumonia, unspecified laterality     Rx / DC Orders   ED Discharge Orders     None        Note:  This document was prepared using Dragon voice recognition software and may include  unintentional dictation errors.   Shaune Pollack, MD 03/05/23 2005

## 2023-03-05 NOTE — Assessment & Plan Note (Signed)
Progressive renal failure to now require dialysis in the setting of significant volume overload Creatinine 7.7 today Bicarb 16 with stable VBG Pending temp cath placement in the ER Status post IV Lasix as patient does still make urine Follow-up formal nephrology recommendations

## 2023-03-05 NOTE — Assessment & Plan Note (Signed)
SSI A1c 

## 2023-03-05 NOTE — ED Notes (Signed)
Vascular team at bedside placing temp dialysis cath at this time.

## 2023-03-05 NOTE — Assessment & Plan Note (Signed)
Noted decompensated respiratory status requiring BiPAP with imaging concerning for pneumonia as well as volume overload Chest ray concerning for possible infiltrate Will check CT chest to better correlate White count 21 Procalcitonin 0.8 IV Rocephin azithromycin for infectious coverage Blood, sputum cultures Urine strep and Legionella Expanded respiratory panel Follow

## 2023-03-05 NOTE — ED Notes (Signed)
DNR band placed on L arm.

## 2023-03-05 NOTE — ED Triage Notes (Signed)
Pt to ED via [facility] from home for [facility] x 2 days. Pt SpO2 on EMS arrival was 64% on room air. Pt had expiratory wheezing and was given 2.5 mg of albuterol. Pt SpO2 improved to 98% on 15 L NRB. Pt is A & O at this time and in NAD.

## 2023-03-05 NOTE — Assessment & Plan Note (Signed)
No active chest pain Troponin negative x 1 EKG stable Follow

## 2023-03-05 NOTE — H&P (Addendum)
History and Physical    Patient: Cassandra [name] ZOX:[id number] DOB: 1927/05/19 DOA: 03/05/2023 DOS: the patient was seen and examined on 03/05/2023 PCP: Danella Penton, MD  Patient coming from: Home  Chief Complaint:  Chief Complaint  Patient presents with   Shortness of Breath   HPI: [name] is a 89 y.o. female with medical history significant of coronary artery disease, type 2 diabetes, stage IV CKD, hyperlipidemia, hypertension, hypothyroidism presenting with acute respiratory failure with hypoxia, volume overload, acute on chronic stage IV CKD, pneumonia.  Patient reports progressive increased work of breathing over the past 2 to 3 days.  Noted baseline stage IV CKD.  Still makes some urine.  Worsening lower extremity swelling as well as mild orthopnea.  No fevers or chills.  Positive generalized malaise.  Positive cough.  No reported chest pain or abdominal pain.  No reported diarrhea.  No reported hemiparesis or confusion.  No reported NSAID use. Presented to the ER afebrile, hemodynamically stable, satting in the 60s room air, transition from nonrebreather to BiPAP at 60% FiO2.  Given 80 mg IV Lasix.  As well as DuoNebs.  White count 21, hemoglobin 12, platelets 690, COVID-negative, VBG grossly stable, creatinine 7.7, potassium 5.8.  Bicarb 16.  BNP of 792.  Troponin 48.  Chest x-ray with borderline cardiomegaly and interstitial edema as well as hazy by basilar airspace opacities concerning for pneumonia.  Vascular surgery placing temporary cath at the bedside. Review of Systems: As mentioned in the history of present illness. All other systems reviewed and are negative. Past Medical History:  Diagnosis Date   Arthritis    osteoarthritis in hands, knees ( R torn cartilage, L meniscus removed)    CAD (coronary artery disease)    Diabetes mellitus without complication (HCC)    Glaucoma    Hyperlipidemia    Hypertension    Hypothyroidism    Past Surgical History:  Procedure  Laterality Date   ABDOMINAL HYSTERECTOMY     CORONARY ANGIOPLASTY WITH STENT PLACEMENT     FOOT SURGERY Bilateral    KNEE ARTHROSCOPY W/ MENISCECTOMY Left    Also has R knee injury (torn cartilage)    Social History:  reports that she quit smoking about 43 years ago. Her smoking use included cigarettes. She has never used smokeless tobacco. She reports current alcohol use. She reports that she does not use drugs.  Allergies  Allergen Reactions   Atorvastatin Other (See Comments)    MYALGIA   Cyclobenzaprine Other (See Comments)   Hydrocodone-Acetaminophen     Other reaction(s): Hallucination when taking whole pill, pt tolerates taking 1/2 pill   Mirtazapine     Other reaction(s): Hallucination   Oxycodone-Acetaminophen Other (See Comments)   Paroxetine Hcl Other (See Comments)   Penicillins Swelling    Lip and orbital swelling   Propoxyphene Other (See Comments)   Trazodone Other (See Comments)    Family History  Problem Relation Age of Onset   Hypertension Mother    Hypertension Father    Early death Father    Heart disease Father     Prior to Admission medications   Medication Sig Start Date End Date Taking? Authorizing Provider  amLODipine (NORVASC) 10 MG tablet Take 1 tablet by mouth daily. 10/16/22 03/01/24 Yes [provider]  amLODipine (NORVASC) 5 MG tablet Take 5 mg by mouth daily. 01/26/23  Yes [provider]  [employer] D3 25 MCG (1000 UT) capsule Take 1,000 Units by mouth daily. 02/21/23  Yes [provider]  timolol (TIMOPTIC) 0.5 % ophthalmic solution Place 1 drop into both eyes every morning. 10/26/22  Yes [provider]  torsemide (DEMADEX) 20 MG tablet Take 20 mg by mouth daily. 11/08/22 03/01/24 Yes [provider]  albuterol (VENTOLIN HFA) 108 (90 Base) MCG/ACT inhaler Inhale into the lungs. 03/20/17   [provider]  ALPRAZolam Prudy Feeler) 0.25 MG tablet Take 0.25 mg by mouth daily as needed.    [provider]   aspirin EC 81 MG EC tablet Take 1 tablet (81 mg total) by mouth daily. Swallow whole. 10/16/21   Gillis Santa, MD  azelastine (ASTELIN) 0.1 % nasal spray Place 2 sprays into both nostrils 2 (two) times daily.    [provider]  cetirizine (ZYRTEC) 10 MG tablet Take 1 tablet by mouth daily as needed for allergies.    [provider]  cloNIDine (CATAPRES) 0.1 MG tablet Take 1 tablet (0.1 mg total) by mouth 2 (two) times daily. 10/05/21 12/11/21  Marrion Coy, MD  DROXIA 300 MG capsule TAKE 1 CAPSULE (300 MG TOTAL) BY MOUTH DAILY. MAY TAKE WITH FOOD TO MINIMIZE GI SIDE EFFECTS. 04/23/20   Alinda Dooms, NP  gabapentin (NEURONTIN) 100 MG capsule Take by mouth.    [provider]  glucose blood test strip Use once daily. Use as instructed. 02/21/17   [provider]  hydrochlorothiazide (HYDRODIURIL) 25 MG tablet Take 25 mg by mouth daily as needed.    [provider]  HYDROcodone-acetaminophen (NORCO/VICODIN) 5-325 MG tablet Take 0.5-1 tablets by mouth 2 (two) times daily as needed.    [provider]  latanoprost (XALATAN) 0.005 % ophthalmic solution Place 1 drop into both eyes at bedtime. 09/13/15   [provider]  levothyroxine (SYNTHROID) 88 MCG tablet Take 88 mcg by mouth daily. Take on an empty stomach 30 to 60 minutes before breakfast 12/08/14   [provider]  LOTREL 5-20 MG capsule Take 1 capsule by mouth daily. 07/03/17   [provider]  metoprolol succinate (TOPROL-XL) 50 MG 24 hr tablet Take 1 tablet by mouth daily. 11/02/14   [provider]  naloxone [facility]) nasal spray 4 mg/0.1 mL Place 1 spray into the nose once as needed. Place 1 spray (4 mg total) into one nostril once as needed (if not breathing.) for up to 1 dose 12/15/20   [provider]  pantoprazole (PROTONIX) 40 MG tablet Take 1 tablet (40 mg total) by mouth daily. 10/16/21 01/14/22  Gillis Santa, MD  polyethylene glycol (MIRALAX /  GLYCOLAX) 17 g packet Take 17 g by mouth daily. 10/06/21   Marrion Coy, MD  rOPINIRole (REQUIP) 0.5 MG tablet Take 0.5 mg by mouth at bedtime. 11/16/21   [provider]  rosuvastatin (CRESTOR) 20 MG tablet Take 20 mg by mouth daily. 01/07/15   [provider]  timolol (TIMOPTIC-XR) 0.5 % ophthalmic gel-forming Place 1 drop into both eyes every morning. 05/06/21   [provider]    Physical Exam: Vitals:   03/05/23 1400 03/05/23 1430 03/05/23 1512 03/05/23 1520  BP: (!) 152/86 (!) 144/76 105/61   Pulse: 67 63 64 74  Resp: (!) 22 (!) 22 (!) 21 (!) 22  Temp:   97.6 F (36.4 C)   TempSrc:   Oral   SpO2: 98% 98% 96% 96%  Weight:       Physical Exam Constitutional:      Appearance: She is obese.     Comments: BiPAP in place  HENT:     Head: Normocephalic.     Nose: Nose normal.     Mouth/Throat:     Mouth: Mucous membranes are moist.  Cardiovascular:     Rate and Rhythm: Normal rate.  Pulmonary:     Effort: Pulmonary effort is normal.     Breath sounds: Rales present.  Abdominal:     General: Bowel sounds are normal.  Musculoskeletal:        General: Normal range of motion.     Cervical back: Normal range of motion.     Right lower leg: Edema present.     Left lower leg: Edema present.  Skin:    General: Skin is warm.  Neurological:     General: No focal deficit present.  Psychiatric:        Mood and Affect: Mood normal.     Data Reviewed:  There are no new results to review at this time.  Assessment and Plan: * Acute respiratory failure with hypoxia (HCC) Decompensated respiratory failure now requiring BiPAP in the setting of volume overload as well as pneumonia Pending temp cath placement as renal failure has progressed to the point of requiring dialysis Noted?  Infiltrates on imaging with white count 21 as well as elevated procalcitonin 0.8 VBG grossly stable IV Rocephin and azithromycin for infectious coverage Blood cultures, urine  strep Legionella, sputum culture, expanded respiratory panel Continue BiPAP for now Status post IV Lasix as patient does make some urine still Follow respiratory status with diuresis, hemodialysis and pneumonia treatment   Pneumonia Noted decompensated respiratory status requiring BiPAP with imaging concerning for pneumonia as well as volume overload Chest ray concerning for possible infiltrate Will check CT chest to better correlate White count 21 Procalcitonin 0.8 IV Rocephin azithromycin for infectious coverage Blood, sputum cultures Urine strep and Legionella Expanded respiratory panel Follow  Acute renal failure superimposed on stage 4 chronic kidney disease (HCC) Progressive renal failure to now require dialysis in the setting of significant volume overload Creatinine 7.7 today Bicarb 16 with stable VBG Pending temp cath placement in the ER Status post IV Lasix as patient does still make urine Follow-up formal nephrology recommendations   Diabetes mellitus, type II (HCC) SSI A1c  Hypertension BP stable Titrate home regimen  CAD (coronary artery disease), native coronary artery No active chest pain Troponin negative x 1 EKG stable Follow  Acquired hypothyroidism Continue Synthroid once respiratory status improves  Hyperkalemia K 5.8 in setting of acute on chronic stage 4 CKD- requiring HD  EKG stable  Pending HD setup  Monitor closely       Advance Care Planning:   Code Status: DNR   Consults: Nephrology, Vascular surgery   Family Communication: No family at the bedside   Severity of Illness: The appropriate patient status for this patient is INPATIENT. Inpatient status is judged to be reasonable and necessary in order to provide the required intensity of service to ensure the patient's safety. The patient's presenting symptoms, physical exam findings, and initial radiographic and laboratory data in the context of their chronic comorbidities is felt  to place them at high risk for further clinical deterioration. Furthermore, it is not anticipated that the patient will be medically stable for discharge from the hospital within 2 midnights of admission.   * I certify that at the point of admission it is my clinical judgment that the patient will require inpatient hospital care spanning beyond 2 midnights from the point of admission due to high intensity  of service, high risk for further deterioration and high frequency of surveillance required.*  Greater than 50% was spent in counseling and coordination of care with patient Total encounter time 80 minutes or more  Author: Floydene Flock, MD 03/05/2023 3:37 PM  For on call review [email or web address].

## 2023-03-05 NOTE — ED Notes (Signed)
Respiratory took patient off of Bipap and put on [place] pt SPO2 at 87-90% called respiratory to come look at patient and they are going to put patient on high flow [place].

## 2023-03-05 NOTE — Assessment & Plan Note (Signed)
BP stable Titrate home regimen 

## 2023-03-05 NOTE — Assessment & Plan Note (Signed)
Decompensated respiratory failure now requiring BiPAP in the setting of volume overload as well as pneumonia Pending temp cath placement as renal failure has progressed to the point of requiring dialysis Noted?  Infiltrates on imaging with white count 21 as well as elevated procalcitonin 0.8 VBG grossly stable IV Rocephin and azithromycin for infectious coverage Blood cultures, urine strep Legionella, sputum culture, expanded respiratory panel Continue BiPAP for now Status post IV Lasix as patient does make some urine still Follow respiratory status with diuresis, hemodialysis and pneumonia treatment

## 2023-03-05 NOTE — Assessment & Plan Note (Signed)
K 5.8 in setting of acute on chronic stage 4 CKD- requiring HD  EKG stable  Pending HD setup  Monitor closely

## 2023-03-05 NOTE — Assessment & Plan Note (Signed)
Continue Synthroid once respiratory status improves

## 2023-03-05 NOTE — Consult Note (Signed)
Hospital Consult    Reason for Consult: Dialysis catheter Requesting Physician: Andrena Mews MRN #:  [id number]  History of Present Illness: This is a 89 y.o. female extensive medical history if he cannot for coronary artery disease, type 2 diabetes, stage IV CKD, hyperlipidemia, hypertension, hypothyroidism presenting with acute respiratory failure with hypoxia, volume overload, acute on chronic stage IV CKD, pneumonia. Patient reports progressive increased work of breathing over the past 2 to 3 days. Noted baseline stage IV CKD. Still makes some urine.   Presented to the ER afebrile, hemodynamically stable, satting in the 60s room air, transition from nonrebreather to BiPAP at 60% FiO2. Vascular Surgery consulted for placement of temporary dialysis catheter.   Past Medical History:  Diagnosis Date   Arthritis    osteoarthritis in hands, knees ( R torn cartilage, L meniscus removed)    CAD (coronary artery disease)    Diabetes mellitus without complication (HCC)    Glaucoma    Hyperlipidemia    Hypertension    Hypothyroidism     Past Surgical History:  Procedure Laterality Date   ABDOMINAL HYSTERECTOMY     CORONARY ANGIOPLASTY WITH STENT PLACEMENT     FOOT SURGERY Bilateral    KNEE ARTHROSCOPY W/ MENISCECTOMY Left    Also has R knee injury (torn cartilage)     Allergies  Allergen Reactions   Atorvastatin Other (See Comments)    MYALGIA   Cyclobenzaprine Other (See Comments)   Hydrocodone-Acetaminophen     Other reaction(s): Hallucination when taking whole pill, pt tolerates taking 1/2 pill   Mirtazapine     Other reaction(s): Hallucination   Oxycodone-Acetaminophen Other (See Comments)   Paroxetine Hcl Other (See Comments)   Penicillins Swelling    Lip and orbital swelling   Propoxyphene Other (See Comments)   Trazodone Other (See Comments)    Prior to Admission medications   Medication Sig Start Date End Date Taking? Authorizing Provider  albuterol (VENTOLIN  HFA) 108 (90 Base) MCG/ACT inhaler Inhale into the lungs. 03/20/17   [provider]  aspirin EC 81 MG EC tablet Take 1 tablet (81 mg total) by mouth daily. Swallow whole. 10/16/21   Gillis Santa, MD  azelastine (ASTELIN) 0.1 % nasal spray Place 2 sprays into both nostrils 2 (two) times daily.    [provider]  cetirizine (ZYRTEC) 10 MG tablet Take 1 tablet by mouth daily as needed for allergies.    [provider]  cloNIDine (CATAPRES) 0.1 MG tablet Take 1 tablet (0.1 mg total) by mouth 2 (two) times daily. 10/05/21 12/11/21  Marrion Coy, MD  DROXIA 300 MG capsule TAKE 1 CAPSULE (300 MG TOTAL) BY MOUTH DAILY. MAY TAKE WITH FOOD TO MINIMIZE GI SIDE EFFECTS. 04/23/20   Alinda Dooms, NP  glucose blood test strip Use once daily. Use as instructed. 02/21/17   [provider]  latanoprost (XALATAN) 0.005 % ophthalmic solution Place 1 drop into both eyes at bedtime. 09/13/15   [provider]  levothyroxine (SYNTHROID) 88 MCG tablet Take 88 mcg by mouth daily. Take on an empty stomach 30 to 60 minutes before breakfast 12/08/14   [provider]  LOTREL 5-20 MG capsule Take 1 capsule by mouth daily. 07/03/17   [provider]  metoprolol succinate (TOPROL-XL) 50 MG 24 hr tablet Take 1 tablet by mouth daily. 11/02/14   [provider]  naloxone [facility]) nasal spray 4 mg/0.1 mL Place 1 spray into the nose once as needed. Place 1 spray (4  mg total) into one nostril once as needed (if not breathing.) for up to 1 dose 12/15/20   [provider]  pantoprazole (PROTONIX) 40 MG tablet Take 1 tablet (40 mg total) by mouth daily. 10/16/21 01/14/22  Gillis Santa, MD  polyethylene glycol (MIRALAX / GLYCOLAX) 17 g packet Take 17 g by mouth daily. 10/06/21   Marrion Coy, MD  rOPINIRole (REQUIP) 0.5 MG tablet Take 0.5 mg by mouth at bedtime. 11/16/21   [provider]  rosuvastatin (CRESTOR) 20 MG tablet Take 20 mg by mouth daily. 01/07/15    [provider]  timolol (TIMOPTIC-XR) 0.5 % ophthalmic gel-forming Place 1 drop into both eyes every morning. 05/06/21   [provider]    Social History   Socioeconomic History   Marital status: Married    Spouse name: Not on file   Number of children: Not on file   Years of education: Not on file   Highest education level: Not on file  Occupational History   Not on file  Tobacco Use   Smoking status: Former    Types: Cigarettes    Quit date: 01/21/1980    Years since quitting: 43.1   Smokeless tobacco: Never  Substance and Sexual Activity   Alcohol use: Yes    Alcohol/week: 0.0 standard drinks of alcohol    Comment: occasionally   Drug use: No   Sexual activity: Never  Other Topics Concern   Not on file  Social History Narrative   Not on file   Social Determinants of Health   Financial Resource Strain: Not on file  Food Insecurity: Not on file  Transportation Needs: Not on file  Physical Activity: Not on file  Stress: Not on file  Social Connections: Not on file  Intimate Partner Violence: Not on file     Family History  Problem Relation Age of Onset   Hypertension Mother    Hypertension Father    Early death Father    Heart disease Father     ROS: Otherwise negative unless mentioned in HPI  Physical Examination  Vitals:   03/05/23 1206 03/05/23 1250  BP:  119/62  Pulse: 76 (!) 161  Resp: (!) 25 (!) 23  Temp:    SpO2: 100% 96%   Body mass index is 29.9 kg/m.  General:  WDWN in NAD Gait: Not observed HENT: WNL, normocephalic Pulmonary: normal non-labored breathing, without Rales, rhonchi,  wheezing Cardiac: regular, without  Murmurs, rubs or gallops; without carotid bruits Abdomen: Positive bowel sounds,  soft, NT/ND, no masses Skin: without rashes Vascular Exam/Pulses: Positive palpable pulses throughout.  Extremities: without ischemic changes, without Gangrene , without cellulitis; without open wounds;  Musculoskeletal:  no muscle wasting or atrophy  Neurologic: A&O X 3;  No focal weakness or paresthesias are detected; speech is fluent/normal Psychiatric:  The pt has Normal affect. Lymph:  Unremarkable  CBC    Component Value Date/Time   WBC 21.0 (H) 03/05/2023 1146   RBC 4.72 03/05/2023 1146   HGB 12.3 03/05/2023 1146   HGB 11.9 (L) 12/07/2012 1641   HCT 40.1 03/05/2023 1146   HCT 37.2 12/07/2012 1641   PLT 690 (H) 03/05/2023 1146   PLT 216 12/07/2012 1641   MCV 85.0 03/05/2023 1146   MCV 88 12/07/2012 1641   MCH 26.1 03/05/2023 1146   MCHC 30.7 03/05/2023 1146   RDW 18.4 (H) 03/05/2023 1146   RDW 15.7 (H) 12/07/2012 1641   LYMPHSABS 1.4 03/05/2023 1146   LYMPHSABS 1.2  12/07/2012 1641   MONOABS 2.9 (H) 03/05/2023 1146   MONOABS 1.1 (H) 12/07/2012 1641   EOSABS 0.1 03/05/2023 1146   EOSABS 0.2 12/07/2012 1641   BASOSABS 0.1 03/05/2023 1146   BASOSABS 0.1 12/07/2012 1641    BMET    Component Value Date/Time   NA 137 03/05/2023 1146   NA 137 12/07/2012 1641   K 5.8 (H) 03/05/2023 1146   K 4.2 12/07/2012 1641   CL 109 03/05/2023 1146   CL 106 12/07/2012 1641   CO2 16 (L) 03/05/2023 1146   CO2 27 12/07/2012 1641   GLUCOSE 90 03/05/2023 1146   GLUCOSE 75 12/07/2012 1641   BUN 77 (H) 03/05/2023 1146   BUN 18 12/07/2012 1641   CREATININE 7.71 (H) 03/05/2023 1146   CREATININE 1.13 12/07/2012 1641   CALCIUM 7.7 (L) 03/05/2023 1146   CALCIUM 8.9 12/07/2012 1641   GFRNONAA 5 (L) 03/05/2023 1146   GFRNONAA 45 (L) 12/07/2012 1641   GFRAA 25 (L) 03/30/2020 1026   GFRAA 52 (L) 12/07/2012 1641    COAGS: Lab Results  Component Value Date   INR 1.1 12/10/2021   INR 1.0 10/12/2021   INR 1.1 05/15/2021     Non-Invasive Vascular Imaging:   EXAM:03/05/2023 CT CHEST WITHOUT CONTRAST   TECHNIQUE: Multidetector CT imaging of the chest was performed following the standard protocol without IV contrast.   RADIATION DOSE REDUCTION: This exam was performed according to the departmental  dose-optimization program which includes automated exposure control, adjustment of the mA and/or kV according to patient size and/or use of iterative reconstruction technique.   COMPARISON:  CT abdomen pelvis 12/11/2021   FINDINGS: Cardiovascular: Coronary artery calcification and aortic atherosclerotic calcification.   Mediastinum/Nodes: RIGHT lower paratracheal node measures 11 mm (50/2). There is mild hilar adenopathy surrounding the distal RIGHT bronchus intermedius (image 70/2). No clear obstructing lesion identified.   Lungs/Pleura: Moderate RIGHT effusion with RIGHT basilar atelectasis. Small LEFT effusion. Mild interstitial edema pattern with interstitial thickening in LEFT or RIGHT lung.   Upper Abdomen: Limited view of the liver, kidneys, pancreas are unremarkable. Normal adrenal glands.   Musculoskeletal: No aggressive osseous lesion.   IMPRESSION: Mild mediastinal adenopathy, RIGHT hilar adenopathy and RIGHT lower lobe atelectasis. Small RIGHT effusion. Differential include RIGHT hilar malignancy versus benign effusion and atelectasis versus pneumonia.   Mild interstitial edema pattern.  Statin:  Yes.   Beta Blocker:  Yes.   Aspirin:  Yes.   ACEI:  No. ARB:  No. CCB use:  No Other antiplatelets/anticoagulants:  No.    ASSESSMENT/PLAN: This is a 89 y.o. female who presents to [facility] ER in respiratory failure with ESRD in need of a temporary dialysis catheter placement.   PLAN:   Dr Festus Barren to place temporary dialysis catheter at the bedside in the emergency department. Successful placement. No complications to note.      Marcie Bal [facility] 03/05/2023 1:58 PM

## 2023-03-05 NOTE — Progress Notes (Signed)
[facility] [place], [place] 03/05/23  Subjective:   Hospital day # 0 Patient known to our practice from outpatient follow-up for CKD.  She presents for generalized malaise, increased work of breathing and worsening lower extremity edema.  COVID test is negative.  Chest x-ray shows cardiomegaly and interstitial edema and bibasilar airspace opacities concerning for pneumonia. Lab results from today show increase in creatinine to 7.71, potassium of 5.8, bicarb of 16. Patient does not have a dialysis access in place.  She has had vein mapping.   Renal: No intake/output data recorded. Lab Results  Component Value Date   CREATININE 7.71 (H) 03/05/2023   CREATININE 3.04 (H) 12/15/2021   CREATININE 3.37 (H) 12/14/2021     Objective:  Vital signs in last 24 hours:  Temp:  [97.6 F (36.4 C)-98.5 F (36.9 C)] 97.6 F (36.4 C) (06/10 1512) Pulse Rate:  [63-161] 70 (06/10 1542) Resp:  [21-31] 21 (06/10 1542) BP: (105-152)/(61-86) 105/61 (06/10 1512) SpO2:  [68 %-100 %] 92 % (06/10 1542) FiO2 (%):  [60 %] 60 % (06/10 1430) Weight:  [69.4 kg] 69.4 kg (06/10 1128)  Weight change:  Filed Weights   03/05/23 1128  Weight: 69.4 kg    Intake/Output:    Intake/Output Summary (Last 24 hours) at 03/05/2023 1645 Last data filed at 03/05/2023 1432 Gross per 24 hour  Intake 100 ml  Output --  Net 100 ml     Physical Exam: General: Ill-appearing  HEENT CPAP mask in place  Pulm/lungs Bilateral diffuse crackles  CVS/Heart Tachycardic, irregular  Abdomen:  Soft, nontender, nondistended  Extremities: Dependent edema present  Neurologic: Alert, able to answer questions  Skin: No acute rashes  Access: To be placed       Basic Metabolic Panel:  Recent Labs  Lab 03/05/23 1146  NA 137  K 5.8*  CL 109  CO2 16*  GLUCOSE 90  BUN 77*  CREATININE 7.71*  CALCIUM 7.7*     CBC: Recent Labs  Lab 03/05/23 1146  WBC 21.0*  NEUTROABS 16.0*  HGB 12.3  HCT 40.1   MCV 85.0  PLT 690*     No results found for: "HEPBSAG", "HEPBSAB", "HEPBIGM"    Microbiology:  Recent Results (from the past 240 hour(s))  SARS Coronavirus 2 by RT PCR (hospital order, performed in [facility] hospital lab) *cepheid single result test* Anterior Nasal Swab     Status: None   Collection Time: 03/05/23 12:02 PM   Specimen: Anterior Nasal Swab  Result Value Ref Range Status   SARS Coronavirus 2 by RT PCR NEGATIVE NEGATIVE Final    Comment: (NOTE) SARS-CoV-2 target nucleic acids are NOT DETECTED.  The SARS-CoV-2 RNA is generally detectable in upper and lower respiratory specimens during the acute phase of infection. The lowest concentration of SARS-CoV-2 viral copies this assay can detect is 250 copies / mL. A negative result does not preclude SARS-CoV-2 infection and should not be used as the sole basis for treatment or other patient management decisions.  A negative result may occur with improper specimen collection / handling, submission of specimen other than nasopharyngeal swab, presence of viral mutation(s) within the areas targeted by this assay, and inadequate number of viral copies (<250 copies / mL). A negative result must be combined with clinical observations, patient history, and epidemiological information.  Fact Sheet for Patients:   RoadLapTop.co.za  Fact Sheet for Healthcare Providers: http://kim-miller.com/  This test is not yet approved or  cleared by the [place]  FDA and has been authorized for detection and/or diagnosis of SARS-CoV-2 by FDA under an Emergency Use Authorization (EUA).  This EUA will remain in effect (meaning this test can be used) for the duration of the COVID-19 declaration under Section 564(b)(1) of the Act, 21 U.S.C. section 360bbb-3(b)(1), unless the authorization is terminated or revoked sooner.  Performed at [facility], [address]  [place], [place] [address]     Coagulation Studies: No results for input(s): "LABPROT", "INR" in the last 72 hours.  Urinalysis: No results for input(s): "COLORURINE", "LABSPEC", "PHURINE", "GLUCOSEU", "HGBUR", "BILIRUBINUR", "KETONESUR", "PROTEINUR", "UROBILINOGEN", "NITRITE", "LEUKOCYTESUR" in the last 72 hours.  Invalid input(s): "APPERANCEUR"    Imaging: PERIPHERAL VASCULAR CATHETERIZATION  Result Date: 03/05/2023 See surgical note for result.  DG Chest Portable 1 View  Result Date: 03/05/2023 CLINICAL DATA:  Shortness of breath for 2 days. Hypoxia. By protrude wheezing. EXAM: PORTABLE CHEST 1 VIEW COMPARISON:  08/25/2021 FINDINGS: Low lung volumes are present, causing crowding of the pulmonary vasculature. The patient is rotated to the left on today's radiograph, reducing diagnostic sensitivity and specificity. Indistinct pulmonary vasculature favoring pulmonary venous hypertension and possibly interstitial edema. Hazy basilar airspace opacities. Possible blunting of the costophrenic angles. Mild linear bands of suspected scarring in the left upper lobe. Atherosclerotic calcification of the aortic arch. Borderline cardiomegaly. Thoracic spondylosis. IMPRESSION: 1. Borderline cardiomegaly with pulmonary venous hypertension and possibly interstitial edema. 2. Hazy basilar airspace opacities, possibly from atelectasis or pneumonia. Equivocal blunting of the costophrenic angles. 3. Low lung volumes. 4. Atherosclerosis. 5. Thoracic spondylosis. Electronically Signed   By: Gaylyn Rong M.D.   On: 03/05/2023 13:27     Medications:    anticoagulant sodium citrate     [START ON 03/06/2023] azithromycin     [START ON 03/06/2023] cefTRIAXone (ROCEPHIN)  IV     furosemide (LASIX) 200 mg in dextrose 5 % 100 mL (2 mg/mL) infusion 8 mg/hr (03/05/23 1548)    [START ON 03/06/2023] Chlorhexidine Gluconate Cloth  6 each Topical Q0600   heparin  5,000 Units Subcutaneous Q8H   [START ON 03/06/2023]  levothyroxine  88 mcg Oral Daily   [START ON 03/06/2023] metoprolol succinate  50 mg Oral Daily   rOPINIRole  0.5 mg Oral QHS   rosuvastatin  20 mg Oral Daily   [START ON 03/06/2023] timolol  1 drop Both Eyes q morning   albuterol, alteplase, anticoagulant sodium citrate, heparin, lidocaine (PF), lidocaine-prilocaine, ondansetron **OR** ondansetron (ZOFRAN) IV, pentafluoroprop-tetrafluoroeth  Assessment/ Plan:  89 y.o. female with coronary artery disease, diabetes type 2, chronic kidney disease, hyperlipidemia, hypertension, hypothyroidism    admitted on 03/05/2023 for Acute respiratory failure with hypoxia (HCC) [J96.01]  End-stage renal disease Patient has advanced chronic kidney disease.  Her baseline creatinine is 5.96/GFR 6 from April 2024. She now comes in with volume overload, hyperkalemia and metabolic acidosis.  We discussed starting hemodialysis via temporary dialysis catheter.  Vascular surgery has been consulted regarding this. As the hospital course progresses, a PermCath will be requested.  Acute metabolic acidosis Expected to improve with hemodialysis treatments.  Volume overload/acute respiratory failure Currently requiring BiPAP. Patient will be started on IV furosemide drip until dialysis can be arranged. Volume status is expected to improve with hemodialysis treatments. There is also a concern for pneumonia with elevated white count.  She has been empirically started on azithromycin and Rocephin.  Hyperkalemia Potassium of 5.8. Expected to correct with hemodialysis.  Critical care coordination with ER, dialysis, hospitalist, vascular surgery team  LOS:  0 Cassandra [name] Thedore Mins 6/10/20244:45 PM  [facility] [address] [place], [place] [phone]  Note: This note was prepared with Dragon dictation. Any transcription errors are unintentional

## 2023-03-06 ENCOUNTER — Inpatient Hospital Stay
Admit: 2023-03-06 | Discharge: 2023-03-06 | Disposition: A | Discharge status: Home / Self Care | Payer: Medicare Other | Attending: Family Medicine | Admitting: Family Medicine

## 2023-03-06 ENCOUNTER — Encounter: Payer: Self-pay | Admitting: Vascular Surgery

## 2023-03-06 DIAGNOSIS — J9601 Acute respiratory failure with hypoxia: Secondary | ICD-10-CM | POA: Diagnosis not present

## 2023-03-06 DIAGNOSIS — N19 Unspecified kidney failure: Secondary | ICD-10-CM | POA: Diagnosis present

## 2023-03-06 LAB — CBC
HCT: 38.8 % (ref 36.0–46.0)
Hemoglobin: 11.7 g/dL — ABNORMAL LOW (ref 12.0–15.0)
MCH: 25.8 pg — ABNORMAL LOW (ref 26.0–34.0)
MCHC: 30.2 g/dL (ref 30.0–36.0)
MCV: 85.5 fL (ref 80.0–100.0)
Platelets: 652 10*3/uL — ABNORMAL HIGH (ref 150–400)
RBC: 4.54 MIL/uL (ref 3.87–5.11)
RDW: 18.3 % — ABNORMAL HIGH (ref 11.5–15.5)
WBC: 19.5 10*3/uL — ABNORMAL HIGH (ref 4.0–10.5)
nRBC: 0 % (ref 0.0–0.2)

## 2023-03-06 LAB — COMPREHENSIVE METABOLIC PANEL
ALT: 12 U/L (ref 0–44)
AST: 28 U/L (ref 15–41)
Albumin: 2.5 g/dL — ABNORMAL LOW (ref 3.5–5.0)
Alkaline Phosphatase: 46 U/L (ref 38–126)
Anion gap: 12 (ref 5–15)
BUN: 81 mg/dL — ABNORMAL HIGH (ref 8–23)
CO2: 18 mmol/L — ABNORMAL LOW (ref 22–32)
Calcium: 7.6 mg/dL — ABNORMAL LOW (ref 8.9–10.3)
Chloride: 109 mmol/L (ref 98–111)
Creatinine, Ser: 8.52 mg/dL — ABNORMAL HIGH (ref 0.44–1.00)
GFR, Estimated: 4 mL/min — ABNORMAL LOW (ref >60)
Glucose, Bld: 74 mg/dL (ref 70–99)
Potassium: 6.2 mmol/L — ABNORMAL HIGH (ref 3.5–5.1)
Sodium: 139 mmol/L (ref 135–145)
Total Bilirubin: 1 mg/dL (ref 0.3–1.2)
Total Protein: 6.5 g/dL (ref 6.5–8.1)

## 2023-03-06 LAB — CULTURE, BLOOD (SINGLE)

## 2023-03-06 LAB — ECHOCARDIOGRAM COMPLETE

## 2023-03-06 LAB — STREP PNEUMONIAE URINARY ANTIGEN: Strep Pneumo Urinary Antigen: NEGATIVE

## 2023-03-06 LAB — CBG MONITORING, ED
Glucose-Capillary: 67 mg/dL — ABNORMAL LOW (ref 70–99)
Glucose-Capillary: 90 mg/dL (ref 70–99)
Glucose-Capillary: 119 mg/dL — ABNORMAL HIGH (ref 70–99)

## 2023-03-06 MED ORDER — FUROSEMIDE 10 MG/ML IJ SOLN
60.0000 mg | Freq: Two times a day (BID) | INTRAMUSCULAR | Status: DC
  Administered 2023-03-07: 60 mg via INTRAVENOUS
  Filled 2023-03-06: qty 6
  Filled 2023-03-06: qty 8

## 2023-03-06 MED ORDER — FUROSEMIDE 10 MG/ML IJ SOLN
INTRAMUSCULAR | Status: AC
  Administered 2023-03-06: 60 mg via INTRAVENOUS
  Filled 2023-03-06: qty 8

## 2023-03-06 MED ORDER — HEPARIN SODIUM (PORCINE) 1000 UNIT/ML IJ SOLN
INTRAMUSCULAR | Status: AC
  Filled 2023-03-06: qty 4

## 2023-03-06 MED ORDER — ALPRAZOLAM 0.5 MG PO TABS
0.2500 mg | ORAL_TABLET | Freq: Every evening | ORAL | Status: AC | PRN
  Administered 2023-03-06: 0.25 mg via ORAL
  Filled 2023-03-06: qty 1

## 2023-03-06 NOTE — ED Notes (Signed)
Pt taken upstairs with transport and Brandy, RN on [facility]NC to dialysis

## 2023-03-06 NOTE — Progress Notes (Signed)
PROGRESS NOTE    Cassandra [name]   ZOX:[id number] DOB: 21-Apr-1927  DOA: 03/05/2023 Date of Service: 03/06/23 PCP: Danella Penton, MD     Brief Narrative / Hospital Course:  Cassandra [name] is a 89 y.o. female with medical history significant of coronary artery disease, type 2 diabetes, stage IV CKD, hyperlipidemia, hypertension, hypothyroidism presenting with acute respiratory failure with hypoxia, volume overload, acute on chronic stage IV CKD, pneumonia.  Patient reports progressive increased work of breathing over the past 2 to 3 days.  06/10: ER afebrile, hemodynamically stable, satting in the 60s room air, transition from nonrebreather to BiPAP at 60% FiO2. Given 80 mg IV Lasix. As well as DuoNebs. White count 21, hemoglobin 12, platelets 690, COVID-negative, VBG grossly stable, creatinine 7.7, potassium 5.8. Bicarb 16. BNP of 792. Troponin 48. Chest x-ray with borderline cardiomegaly and interstitial edema as well as hazy by basilar airspace opacities concerning for pneumonia. Vascular surgery placed temporary cath at the bedside. Resp PCR neg. CT chest adenopathy, mild interstitial edema and mild effusion, will need outpatient f/u.  06/11: hemodialysis initiated today   Consultants:  Vascular surgery Nephrology   Procedures: 06/10: R femoral temp cath placement for HD       ASSESSMENT & PLAN:   Principal Problem:   Acute respiratory failure with hypoxia (HCC) Active Problems:   Pneumonia   Acute renal failure superimposed on stage 4 chronic kidney disease (HCC)   Diabetes mellitus, type II (HCC)   Hypertension   CAD (coronary artery disease), native coronary artery   Acquired hypothyroidism   ESRD (end stage renal disease) (HCC)   Hyperkalemia   Acute respiratory failure with hypoxia (HCC) d/t fluid overload in setting of renal failure, also complicated by likely pneumonia noted on CXR and supported by leukocytosis and elevated procalcitonin O2 support noninvasive,  pt is DNR  Treat underlying causes as below  Follow respiratory status with diuresis, hemodialysis and pneumonia treatment  Acute renal failure superimposed on stage 4 chronic kidney disease (HCC) S/p temp cath placement in the ER Status post IV Lasix as patient does still make urine Hemodialysis today   Questionable Pneumonia Will check CT chest to better correlate --> nothing obvious  White count 21 - follow Procalcitonin 0.8 - follow trend  IV Rocephin azithromycin for infectious coverage --> d/c if cultures negative  Blood, sputum cultures Urine strep and Legionella pending Expanded respiratory panel negative for viral illness   Acquired hypothyroidism Continue Synthroid once respiratory status improves  CAD (coronary artery disease), native coronary artery No active chest pain Troponin negative x 1 EKG stable Follow  Diabetes mellitus, type II (HCC) SSI A1c  Hypertension BP stable Titrate home regimen  Hyperkalemia K 5.8 in setting of acute on chronic stage 4 CKD- requiring HD  EKG stable  Pending HD setup  Monitor closely  Abn CT chest  Mild mediastinal adenopathy, RIGHT hilar adenopathy and RIGHT lower lobe atelectasis. Small RIGHT effusion. Differential include RIGHT hilar malignancy versus benign effusion and atelectasis versus pneumonia. Mild interstitial edema pattern. Recommend clinical correlation and follow-up contrast CT in 4 to 6 weeks.      DVT prophylaxis: heparin  Pertinent IV fluids/nutrition: no continuous IV fluids  Central lines / invasive devices: R Femoral temp cath   Code Status: DNR ACP documentation reviewed: 03/06/23 none on file w/ [facility]   Current Admission Status: inpatient   TOC needs / Dispo plan: TBD Barriers to discharge / significant pending items: dialysis today, correction of underlying electrolyte  derangements as above, O2 requirement              Subjective / Brief ROS:  Patient reports feeling a bit  better now in terms of breathing  Denies CP Pain controlled.  Denies new weakness.  Reports no concerns w/ urination/defecation.   Family Communication: husband is at bedside on rounds     Objective Findings:  Vitals:   03/06/23 1530 03/06/23 1600 03/06/23 1620 03/06/23 1630  BP: (!) 176/110 (!) 182/92 (!) 188/88   Pulse: 93 86 98   Resp: (!) 25 (!) 22 (!) 22   Temp:   97.8 F (36.6 C)   TempSrc:   Oral   SpO2: 92% 94% 93%   Weight:    68.1 kg    Intake/Output Summary (Last 24 hours) at 03/06/2023 1654 Last data filed at 03/06/2023 1023 Gross per 24 hour  Intake --  Output 975 ml  Net -975 ml   Filed Weights   03/05/23 1128 03/06/23 1431 03/06/23 1630  Weight: 69.4 kg 68.1 kg 68.1 kg    Examination:  Physical Exam Constitutional:      General: She is not in acute distress.    Appearance: She is well-developed.  Cardiovascular:     Rate and Rhythm: Normal rate and regular rhythm.  Pulmonary:     Breath sounds: Examination of the right-middle field reveals rales. Examination of the left-middle field reveals rales. Examination of the right-lower field reveals decreased breath sounds. Examination of the left-lower field reveals decreased breath sounds. Decreased breath sounds and rales present.  Musculoskeletal:     Right lower leg: No edema.     Left lower leg: No edema.  Skin:    General: Skin is warm and dry.  Neurological:     General: No focal deficit present.     Mental Status: She is alert and oriented to person, place, and time.  Psychiatric:        Mood and Affect: Mood normal.        Behavior: Behavior normal.          Scheduled Medications:   Chlorhexidine Gluconate Cloth  6 each Topical Q0600   furosemide  60 mg Intravenous BID   heparin  5,000 Units Subcutaneous Q8H   levothyroxine  88 mcg Oral Daily   rOPINIRole  0.5 mg Oral QHS   rosuvastatin  20 mg Oral Daily   timolol  1 drop Both Eyes q morning    Continuous Infusions:   anticoagulant sodium citrate     azithromycin     cefTRIAXone (ROCEPHIN)  IV Stopped (03/06/23 1333)    PRN Medications:  albuterol, alteplase, anticoagulant sodium citrate, heparin, lidocaine (PF), lidocaine-prilocaine, ondansetron **OR** ondansetron (ZOFRAN) IV, pentafluoroprop-tetrafluoroeth  Antimicrobials from admission:  Anti-infectives (From admission, onward)    Start     Dose/Rate Route Frequency Ordered Stop   03/06/23 1500  azithromycin (ZITHROMAX) 500 mg in sodium chloride 0.9 % 250 mL IVPB        500 mg 250 mL/hr over 60 Minutes Intravenous Every 24 hours 03/05/23 1516 03/10/23 1459   03/06/23 1200  cefTRIAXone (ROCEPHIN) 2 g in sodium chloride 0.9 % 100 mL IVPB        2 g 200 mL/hr over 30 Minutes Intravenous Every 24 hours 03/05/23 1516 03/10/23 1159   03/05/23 1345  cefTRIAXone (ROCEPHIN) 2 g in sodium chloride 0.9 % 100 mL IVPB        2 g 200 mL/hr over 30 Minutes  Intravenous  Once 03/05/23 1330 03/05/23 1432   03/05/23 1345  azithromycin (ZITHROMAX) 500 mg in sodium chloride 0.9 % 250 mL IVPB        500 mg 250 mL/hr over 60 Minutes Intravenous  Once 03/05/23 1330 03/05/23 1703           Data Reviewed:  I have personally reviewed the following...  CBC: Recent Labs  Lab 03/05/23 1146 03/06/23 0609  WBC 21.0* 19.5*  NEUTROABS 16.0*  --   HGB 12.3 11.7*  HCT 40.1 38.8  MCV 85.0 85.5  PLT 690* 652*   Basic Metabolic Panel: Recent Labs  Lab 03/05/23 1146 03/06/23 0609  NA 137 139  K 5.8* 6.2*  CL 109 109  CO2 16* 18*  GLUCOSE 90 74  BUN 77* 81*  CREATININE 7.71* 8.52*  CALCIUM 7.7* 7.6*   GFR: CrCl cannot be calculated (Unknown ideal weight.). Liver Function Tests: Recent Labs  Lab 03/05/23 1146 03/06/23 0609  AST 23 28  ALT 15 12  ALKPHOS 47 46  BILITOT 0.7 1.0  PROT 6.9 6.5  ALBUMIN 2.6* 2.5*   No results for input(s): "LIPASE", "AMYLASE" in the last 168 hours. No results for input(s): "AMMONIA" in the last 168  hours. Coagulation Profile: No results for input(s): "INR", "PROTIME" in the last 168 hours. Cardiac Enzymes: No results for input(s): "CKTOTAL", "CKMB", "CKMBINDEX", "TROPONINI" in the last 168 hours. BNP (last 3 results) No results for input(s): "PROBNP" in the last 8760 hours. HbA1C: No results for input(s): "HGBA1C" in the last 72 hours. CBG: No results for input(s): "GLUCAP" in the last 168 hours. Lipid Profile: No results for input(s): "CHOL", "HDL", "LDLCALC", "TRIG", "CHOLHDL", "LDLDIRECT" in the last 72 hours. Thyroid Function Tests: No results for input(s): "TSH", "T4TOTAL", "FREET4", "T3FREE", "THYROIDAB" in the last 72 hours. Anemia Panel: No results for input(s): "VITAMINB12", "FOLATE", "FERRITIN", "TIBC", "IRON", "RETICCTPCT" in the last 72 hours. Most Recent Urinalysis On File:     Component Value Date/Time   COLORURINE STRAW (A) 12/12/2021 1702   APPEARANCEUR CLEAR (A) 12/12/2021 1702   LABSPEC 1.005 12/12/2021 1702   PHURINE 7.0 12/12/2021 1702   GLUCOSEU 50 (A) 12/12/2021 1702   HGBUR MODERATE (A) 12/12/2021 1702   BILIRUBINUR NEGATIVE 12/12/2021 1702   KETONESUR NEGATIVE 12/12/2021 1702   PROTEINUR 100 (A) 12/12/2021 1702   NITRITE NEGATIVE 12/12/2021 1702   LEUKOCYTESUR SMALL (A) 12/12/2021 1702   Sepsis Labs: @LABRCNTIP (procalcitonin:4,lacticidven:4) Microbiology: Recent Results (from the past 240 hour(s))  Blood culture (single)     Status: None (Preliminary result)   Collection Time: 03/05/23 11:45 AM   Specimen: BLOOD  Result Value Ref Range Status   Specimen Description BLOOD BLOOD LEFT HAND  Final   Special Requests   Final    BOTTLES DRAWN AEROBIC AND ANAEROBIC Blood Culture adequate volume   Culture   Final    NO GROWTH < 24 HOURS Performed at [facility], [address], [place], [place] [address]    Report Status PENDING  Incomplete  SARS Coronavirus 2 by RT PCR (hospital order, performed in [facility] hospital lab) *cepheid  single result test* Anterior Nasal Swab     Status: None   Collection Time: 03/05/23 12:02 PM   Specimen: Anterior Nasal Swab  Result Value Ref Range Status   SARS Coronavirus 2 by RT PCR NEGATIVE NEGATIVE Final    Comment: (NOTE) SARS-CoV-2 target nucleic acids are NOT DETECTED.  The SARS-CoV-2 RNA is generally detectable in upper and lower  respiratory specimens during the acute phase of infection. The lowest concentration of SARS-CoV-2 viral copies this assay can detect is 250 copies / mL. A negative result does not preclude SARS-CoV-2 infection and should not be used as the sole basis for treatment or other patient management decisions.  A negative result may occur with improper specimen collection / handling, submission of specimen other than nasopharyngeal swab, presence of viral mutation(s) within the areas targeted by this assay, and inadequate number of viral copies (<250 copies / mL). A negative result must be combined with clinical observations, patient history, and epidemiological information.  Fact Sheet for Patients:   RoadLapTop.co.za  Fact Sheet for Healthcare Providers: http://kim-miller.com/  This test is not yet approved or  cleared by the [place] FDA and has been authorized for detection and/or diagnosis of SARS-CoV-2 by FDA under an Emergency Use Authorization (EUA).  This EUA will remain in effect (meaning this test can be used) for the duration of the COVID-19 declaration under Section 564(b)(1) of the Act, 21 U.S.C. section 360bbb-3(b)(1), unless the authorization is terminated or revoked sooner.  Performed at [facility], [address] [place], [place] [address]   Respiratory (~20 pathogens) panel by PCR     Status: None   Collection Time: 03/05/23  4:00 PM   Specimen: Nasopharyngeal Swab; Respiratory  Result Value Ref Range Status   Adenovirus NOT DETECTED NOT DETECTED Final   Coronavirus  229E NOT DETECTED NOT DETECTED Final    Comment: (NOTE) The Coronavirus on the Respiratory Panel, DOES NOT test for the novel  Coronavirus (2019 nCoV)    Coronavirus HKU1 NOT DETECTED NOT DETECTED Final   Coronavirus NL63 NOT DETECTED NOT DETECTED Final   Coronavirus OC43 NOT DETECTED NOT DETECTED Final   Metapneumovirus NOT DETECTED NOT DETECTED Final   Rhinovirus / Enterovirus NOT DETECTED NOT DETECTED Final   Influenza A NOT DETECTED NOT DETECTED Final   Influenza B NOT DETECTED NOT DETECTED Final   Parainfluenza Virus 1 NOT DETECTED NOT DETECTED Final   Parainfluenza Virus 2 NOT DETECTED NOT DETECTED Final   Parainfluenza Virus 3 NOT DETECTED NOT DETECTED Final   Parainfluenza Virus 4 NOT DETECTED NOT DETECTED Final   Respiratory Syncytial Virus NOT DETECTED NOT DETECTED Final   Bordetella pertussis NOT DETECTED NOT DETECTED Final   Bordetella Parapertussis NOT DETECTED NOT DETECTED Final   Chlamydophila pneumoniae NOT DETECTED NOT DETECTED Final   Mycoplasma pneumoniae NOT DETECTED NOT DETECTED Final    Comment: Performed at [facility], [address] [place], [place] [address]      Radiology Studies last 3 days: CT CHEST WO CONTRAST  Result Date: 03/05/2023 CLINICAL DATA:  Respiratory illness. EXAM: CT CHEST WITHOUT CONTRAST TECHNIQUE: Multidetector CT imaging of the chest was performed following the standard protocol without IV contrast. RADIATION DOSE REDUCTION: This exam was performed according to the departmental dose-optimization program which includes automated exposure control, adjustment of the mA and/or kV according to patient size and/or use of iterative reconstruction technique. COMPARISON:  CT abdomen pelvis 12/11/2021 FINDINGS: Cardiovascular: Coronary artery calcification and aortic atherosclerotic calcification. Mediastinum/Nodes: RIGHT lower paratracheal node measures 11 mm (50/2). There is mild hilar adenopathy surrounding the distal RIGHT bronchus  intermedius (image 70/2). No clear obstructing lesion identified. Lungs/Pleura: Moderate RIGHT effusion with RIGHT basilar atelectasis. Small LEFT effusion. Mild interstitial edema pattern with interstitial thickening in LEFT or RIGHT lung. Upper Abdomen: Limited view of the liver, kidneys, pancreas are unremarkable. Normal adrenal glands. Musculoskeletal:  No aggressive osseous lesion. IMPRESSION: Mild mediastinal adenopathy, RIGHT hilar adenopathy and RIGHT lower lobe atelectasis. Small RIGHT effusion. Differential include RIGHT hilar malignancy versus benign effusion and atelectasis versus pneumonia. Mild interstitial edema pattern. Recommend clinical correlation and follow-up contrast CT in 4 to 6 weeks. Electronically Signed   By: Genevive Bi M.D.   On: 03/05/2023 17:09   PERIPHERAL VASCULAR CATHETERIZATION  Result Date: 03/05/2023 See surgical note for result.  DG Chest Portable 1 View  Result Date: 03/05/2023 CLINICAL DATA:  Shortness of breath for 2 days. Hypoxia. By protrude wheezing. EXAM: PORTABLE CHEST 1 VIEW COMPARISON:  08/25/2021 FINDINGS: Low lung volumes are present, causing crowding of the pulmonary vasculature. The patient is rotated to the left on today's radiograph, reducing diagnostic sensitivity and specificity. Indistinct pulmonary vasculature favoring pulmonary venous hypertension and possibly interstitial edema. Hazy basilar airspace opacities. Possible blunting of the costophrenic angles. Mild linear bands of suspected scarring in the left upper lobe. Atherosclerotic calcification of the aortic arch. Borderline cardiomegaly. Thoracic spondylosis. IMPRESSION: 1. Borderline cardiomegaly with pulmonary venous hypertension and possibly interstitial edema. 2. Hazy basilar airspace opacities, possibly from atelectasis or pneumonia. Equivocal blunting of the costophrenic angles. 3. Low lung volumes. 4. Atherosclerosis. 5. Thoracic spondylosis. Electronically Signed   By: Gaylyn Rong M.D.   On: 03/05/2023 13:27             LOS: 1 day    Time spent: 50 min    [name], DO [facility] 03/06/2023, 4:54 PM    Dictation software may have been used to generate the above note. Typos may occur and escape review in typed/dictated notes. Please contact Dr Lyn Hollingshead directly for clarity if needed.  Staff may message me via secure chat in Epic  but this may not receive an immediate response,  please page me for urgent matters!  If 7PM-7AM, please contact night coverage [email or web address]

## 2023-03-06 NOTE — ED Notes (Signed)
Signed consent for HD present

## 2023-03-06 NOTE — ED Notes (Signed)
Patient resting in bed watching television. States she did not receive dinner. Sandwich tray and graham crackers given to patient. Patient denies additional needs. No distress noted at this time.

## 2023-03-06 NOTE — ED Notes (Signed)
Patient states her breathing is "labored." This RN asked patient how long her breathing has been labored and patient stated "About a day." Patient denies increased shortness of breath over the past hour. Patient noted to be very low in the bed. Patient boosted up in bed and positioned for comfort. HFNC increased from 7 to 8L. Patient's oxygen saturation 93%. Within a few minutes patient stated, "You've helped me a lot." And reports her breathing has improved with interventions. Denies additional needs at this time.

## 2023-03-06 NOTE — ED Notes (Signed)
Patient requested to be checked to see if she had urine in brief and it was dry, put yellow socks on patient and turned the television back on for patient.

## 2023-03-06 NOTE — Progress Notes (Signed)
[facility] [place], [place] 03/06/23  Subjective:   Hospital day # 1 Patient known to our practice from outpatient follow-up for CKD.   Weaned on bipap, on 10L HFNC Ate small amount of breakfast  Will receive dialysis later today.  Renal: 06/10 0701 - 06/11 0700 In: 100 [IV Piggyback:100] Out: 400 [Urine:400] Lab Results  Component Value Date   CREATININE 8.52 (H) 03/06/2023   CREATININE 7.71 (H) 03/05/2023   CREATININE 3.04 (H) 12/15/2021     Objective:  Vital signs in last 24 hours:  Temp:  [97.7 F (36.5 C)-98.9 F (37.2 C)] 98.6 F (37 C) (06/11 1352) Pulse Rate:  [59-93] 86 (06/11 1600) Resp:  [15-36] 22 (06/11 1600) BP: (102-188)/(62-110) 182/92 (06/11 1600) SpO2:  [89 %-96 %] 94 % (06/11 1600) FiO2 (%):  [100 %] 100 % (06/11 0730) Weight:  [68.1 kg] 68.1 kg (06/11 1431)  Weight change:  Filed Weights   03/05/23 1128 03/06/23 1431  Weight: 69.4 kg 68.1 kg    Intake/Output:    Intake/Output Summary (Last 24 hours) at 03/06/2023 1619 Last data filed at 03/06/2023 1023 Gross per 24 hour  Intake --  Output 975 ml  Net -975 ml      Physical Exam: General: Ill-appearing  HEENT Normocephalic   Pulm/lungs Bilateral diffuse crackles, HFNC  CVS/Heart regular rate and rhythm   Abdomen:  Soft, nontender, nondistended  Extremities: Dependent edema present  Neurologic: Alert, able to answer questions  Skin: No acute rashes  Access: To be placed       Basic Metabolic Panel:  Recent Labs  Lab 03/05/23 1146 03/06/23 0609  NA 137 139  K 5.8* 6.2*  CL 109 109  CO2 16* 18*  GLUCOSE 90 74  BUN 77* 81*  CREATININE 7.71* 8.52*  CALCIUM 7.7* 7.6*      CBC: Recent Labs  Lab 03/05/23 1146 03/06/23 0609  WBC 21.0* 19.5*  NEUTROABS 16.0*  --   HGB 12.3 11.7*  HCT 40.1 38.8  MCV 85.0 85.5  PLT 690* 652*       Lab Results  Component Value Date   HEPBSAG NON REACTIVE 03/05/2023      Microbiology:  Recent Results  (from the past 240 hour(s))  Blood culture (single)     Status: None (Preliminary result)   Collection Time: 03/05/23 11:45 AM   Specimen: BLOOD  Result Value Ref Range Status   Specimen Description BLOOD BLOOD LEFT HAND  Final   Special Requests   Final    BOTTLES DRAWN AEROBIC AND ANAEROBIC Blood Culture adequate volume   Culture   Final    NO GROWTH < 24 HOURS Performed at [facility], [address], [place], [place] [address]    Report Status PENDING  Incomplete  SARS Coronavirus 2 by RT PCR (hospital order, performed in [facility] hospital lab) *cepheid single result test* Anterior Nasal Swab     Status: None   Collection Time: 03/05/23 12:02 PM   Specimen: Anterior Nasal Swab  Result Value Ref Range Status   SARS Coronavirus 2 by RT PCR NEGATIVE NEGATIVE Final    Comment: (NOTE) SARS-CoV-2 target nucleic acids are NOT DETECTED.  The SARS-CoV-2 RNA is generally detectable in upper and lower respiratory specimens during the acute phase of infection. The lowest concentration of SARS-CoV-2 viral copies this assay can detect is 250 copies / mL. A negative result does not preclude SARS-CoV-2 infection and should not be used as the sole basis for  treatment or other patient management decisions.  A negative result may occur with improper specimen collection / handling, submission of specimen other than nasopharyngeal swab, presence of viral mutation(s) within the areas targeted by this assay, and inadequate number of viral copies (<250 copies / mL). A negative result must be combined with clinical observations, patient history, and epidemiological information.  Fact Sheet for Patients:   RoadLapTop.co.za  Fact Sheet for Healthcare Providers: http://kim-miller.com/  This test is not yet approved or  cleared by the [place] FDA and has been authorized for detection and/or diagnosis of SARS-CoV-2 by FDA under an  Emergency Use Authorization (EUA).  This EUA will remain in effect (meaning this test can be used) for the duration of the COVID-19 declaration under Section 564(b)(1) of the Act, 21 U.S.C. section 360bbb-3(b)(1), unless the authorization is terminated or revoked sooner.  Performed at [facility], [address] [place], [place] [address]   Respiratory (~20 pathogens) panel by PCR     Status: None   Collection Time: 03/05/23  4:00 PM   Specimen: Nasopharyngeal Swab; Respiratory  Result Value Ref Range Status   Adenovirus NOT DETECTED NOT DETECTED Final   Coronavirus 229E NOT DETECTED NOT DETECTED Final    Comment: (NOTE) The Coronavirus on the Respiratory Panel, DOES NOT test for the novel  Coronavirus (2019 nCoV)    Coronavirus HKU1 NOT DETECTED NOT DETECTED Final   Coronavirus NL63 NOT DETECTED NOT DETECTED Final   Coronavirus OC43 NOT DETECTED NOT DETECTED Final   Metapneumovirus NOT DETECTED NOT DETECTED Final   Rhinovirus / Enterovirus NOT DETECTED NOT DETECTED Final   Influenza A NOT DETECTED NOT DETECTED Final   Influenza B NOT DETECTED NOT DETECTED Final   Parainfluenza Virus 1 NOT DETECTED NOT DETECTED Final   Parainfluenza Virus 2 NOT DETECTED NOT DETECTED Final   Parainfluenza Virus 3 NOT DETECTED NOT DETECTED Final   Parainfluenza Virus 4 NOT DETECTED NOT DETECTED Final   Respiratory Syncytial Virus NOT DETECTED NOT DETECTED Final   Bordetella pertussis NOT DETECTED NOT DETECTED Final   Bordetella Parapertussis NOT DETECTED NOT DETECTED Final   Chlamydophila pneumoniae NOT DETECTED NOT DETECTED Final   Mycoplasma pneumoniae NOT DETECTED NOT DETECTED Final    Comment: Performed at [facility], [address] [place], [place] [address]    Coagulation Studies: No results for input(s): "LABPROT", "INR" in the last 72 hours.  Urinalysis: No results for input(s): "COLORURINE", "LABSPEC", "PHURINE", "GLUCOSEU", "HGBUR", "BILIRUBINUR", "KETONESUR",  "PROTEINUR", "UROBILINOGEN", "NITRITE", "LEUKOCYTESUR" in the last 72 hours.  Invalid input(s): "APPERANCEUR"    Imaging: CT CHEST WO CONTRAST  Result Date: 03/05/2023 CLINICAL DATA:  Respiratory illness. EXAM: CT CHEST WITHOUT CONTRAST TECHNIQUE: Multidetector CT imaging of the chest was performed following the standard protocol without IV contrast. RADIATION DOSE REDUCTION: This exam was performed according to the departmental dose-optimization program which includes automated exposure control, adjustment of the mA and/or kV according to patient size and/or use of iterative reconstruction technique. COMPARISON:  CT abdomen pelvis 12/11/2021 FINDINGS: Cardiovascular: Coronary artery calcification and aortic atherosclerotic calcification. Mediastinum/Nodes: RIGHT lower paratracheal node measures 11 mm (50/2). There is mild hilar adenopathy surrounding the distal RIGHT bronchus intermedius (image 70/2). No clear obstructing lesion identified. Lungs/Pleura: Moderate RIGHT effusion with RIGHT basilar atelectasis. Small LEFT effusion. Mild interstitial edema pattern with interstitial thickening in LEFT or RIGHT lung. Upper Abdomen: Limited view of the liver, kidneys, pancreas are unremarkable. Normal adrenal glands. Musculoskeletal: No aggressive osseous lesion. IMPRESSION: Mild  mediastinal adenopathy, RIGHT hilar adenopathy and RIGHT lower lobe atelectasis. Small RIGHT effusion. Differential include RIGHT hilar malignancy versus benign effusion and atelectasis versus pneumonia. Mild interstitial edema pattern. Recommend clinical correlation and follow-up contrast CT in 4 to 6 weeks. Electronically Signed   By: Genevive Bi M.D.   On: 03/05/2023 17:09   PERIPHERAL VASCULAR CATHETERIZATION  Result Date: 03/05/2023 See surgical note for result.  DG Chest Portable 1 View  Result Date: 03/05/2023 CLINICAL DATA:  Shortness of breath for 2 days. Hypoxia. By protrude wheezing. EXAM: PORTABLE CHEST 1 VIEW  COMPARISON:  08/25/2021 FINDINGS: Low lung volumes are present, causing crowding of the pulmonary vasculature. The patient is rotated to the left on today's radiograph, reducing diagnostic sensitivity and specificity. Indistinct pulmonary vasculature favoring pulmonary venous hypertension and possibly interstitial edema. Hazy basilar airspace opacities. Possible blunting of the costophrenic angles. Mild linear bands of suspected scarring in the left upper lobe. Atherosclerotic calcification of the aortic arch. Borderline cardiomegaly. Thoracic spondylosis. IMPRESSION: 1. Borderline cardiomegaly with pulmonary venous hypertension and possibly interstitial edema. 2. Hazy basilar airspace opacities, possibly from atelectasis or pneumonia. Equivocal blunting of the costophrenic angles. 3. Low lung volumes. 4. Atherosclerosis. 5. Thoracic spondylosis. Electronically Signed   By: Gaylyn Rong M.D.   On: 03/05/2023 13:27     Medications:    anticoagulant sodium citrate     azithromycin     cefTRIAXone (ROCEPHIN)  IV Stopped (03/06/23 1333)   furosemide (LASIX) 200 mg in dextrose 5 % 100 mL (2 mg/mL) infusion 8 mg/hr (03/06/23 0740)    Chlorhexidine Gluconate Cloth  6 each Topical Q0600   heparin  5,000 Units Subcutaneous Q8H   levothyroxine  88 mcg Oral Daily   rOPINIRole  0.5 mg Oral QHS   rosuvastatin  20 mg Oral Daily   timolol  1 drop Both Eyes q morning   albuterol, alteplase, anticoagulant sodium citrate, heparin, lidocaine (PF), lidocaine-prilocaine, ondansetron **OR** ondansetron (ZOFRAN) IV, pentafluoroprop-tetrafluoroeth  Assessment/ Plan:  89 y.o. female with coronary artery disease, diabetes type 2, chronic kidney disease, hyperlipidemia, hypertension, hypothyroidism    admitted on 03/05/2023 for Acute pulmonary edema (HCC) [J81.0] ESRD (end stage renal disease) (HCC) [N18.6] Acute respiratory failure with hypoxia (HCC) [J96.01] Community acquired pneumonia, unspecified laterality  [J18.9]  End-stage renal disease Patient has advanced chronic kidney disease.  Her baseline creatinine is 5.96/GFR 6 from April 2024. She now comes in with volume overload, hyperkalemia and metabolic acidosis.    Appreciate vascular surgery placing HD temp cath. Patient will receive initial dialysis treatment today and 2 additional treatment over the next two days. Will seek permcath placement later this week.   Acute metabolic acidosis Will improve with hemodialysis treatments.  Volume overload/acute respiratory failure Weaned from Bipap to HFNC Will stop Furosemide drip and transition to IV furosemide 60mg  BID Volume status will improve hemodialysis treatments.  Continue azithromycin and Rocephin for treatment of suspected pneumonia..  Hyperkalemia Potassium 6.2 today, will dialyze on 1K bath    Critical care coordination with ER, dialysis, hospitalist, vascular surgery team   LOS: 1 [facility] 6/11/20244:19 PM  [facility] [place], [place] [phone]

## 2023-03-06 NOTE — Progress Notes (Signed)
*  PRELIMINARY RESULTS* Echocardiogram 2D Echocardiogram has been performed.  Carolyne Fiscal 03/06/2023, 1:49 PM

## 2023-03-06 NOTE — ED Notes (Signed)
Patient placed on hospital bed and positioned for comfort at this time. Warm blankets applied.  Labored respirations on 10L high flow oxygen with movement, NSR on monitor at 80's. Oxygen saturation 88% after moving from stretcher to bed but back up to 94% while resting in hospital bed. Patient denies further needs.

## 2023-03-06 NOTE — ED Notes (Signed)
Report given to dialysis nurse @this  time

## 2023-03-06 NOTE — Procedures (Signed)
Received patient in bed to unit.  Alert and oriented.  Informed consent signed and in chart.   TX duration: 2hrs  Patient tolerated well.  Transported back to the room  Alert, without acute distress.  Hand-off given to patient's nurse.   Access used: Right femoral Hd catehter. Access issues: NONE  Total UF removed: 0  Medication(s) given: NONE     [facility]

## 2023-03-06 NOTE — ED Notes (Signed)
Husband at [facility]. Pt alert, NAD, calm, interactive. Denies sx or complaints. VSS.

## 2023-03-06 NOTE — ED Notes (Signed)
This RN answered patient's call light, patient states she believes she may have had a BM. Brief clean and dry. Purewick discarded, peri care performed and new purewick and new brief applied. Patient noted to have desaturation to 86% with exertion of rolling side to side. Patient positioned for comfort and oxygen noted to increase to 92% on 8L HFNC at rest. Patient declines additional needs.

## 2023-03-06 NOTE — ED Notes (Addendum)
Estimated time for HD is ~1100.

## 2023-03-06 NOTE — Hospital Course (Addendum)
Cassandra [name] is a 89 y.o. female with medical history significant of coronary artery disease, type 2 diabetes, stage IV CKD, hyperlipidemia, hypertension, hypothyroidism presenting with acute respiratory failure with hypoxia, volume overload, acute on chronic stage IV CKD, pneumonia.  Patient reports progressive increased work of breathing over the past 2 to 3 days.  06/10: ER afebrile, hemodynamically stable, satting in the 60s room air, transition from nonrebreather to BiPAP at 60% FiO2. Given 80 mg IV Lasix. As well as DuoNebs. White count 21, hemoglobin 12, platelets 690, COVID-negative, VBG grossly stable, creatinine 7.7, potassium 5.8. Bicarb 16. BNP of 792. Troponin 48. Chest x-ray with borderline cardiomegaly and interstitial edema as well as hazy by basilar airspace opacities concerning for pneumonia. Vascular surgery placed temporary cath at the bedside. Resp PCR neg. CT chest adenopathy, mild interstitial edema and mild effusion, will need outpatient f/u.  06/11: hemodialysis initiated today. Requiring HFNC O2 06/12: continue HD, plan permcath placement tomorrow.  06/13: weaning on O2 down to 5L/min. Permcath placement  06/14: confirmed outpatient HD setup for Tuesday - plan HD tomorrow, hopefully can reduce O2 requirement, she is still needing 5L/min. Will try working w/ PT/OT.   Consultants:  Vascular surgery Nephrology   Procedures: 06/10: R femoral temp cath placement for HD - removed 06/13 06/13: R IJ perm cath placement      ASSESSMENT & PLAN:   Principal Problem:   Acute respiratory failure with hypoxia (HCC) Active Problems:   Pneumonia   Acute renal failure superimposed on stage 4 chronic kidney disease (HCC)   Diabetes mellitus, type II (HCC)   Hypertension   CAD (coronary artery disease), native coronary artery   Acquired hypothyroidism   ESRD (end stage renal disease) (HCC)   Hyperkalemia   Acute respiratory failure with hypoxia (HCC) d/t fluid overload in  setting of renal failure, also complicated by potential pneumonia noted on CXR and supported by leukocytosis and elevated procalcitonin O2 support noninvasive, pt is DNR  Treat underlying causes as below  Follow respiratory status with diuresis, hemodialysis and pneumonia treatment  ESRD Hemodialysis initiated Renal navigator confirmed TTS outpatient dialysis    Questionable Community Acquired Pneumonia Urine Legionella, urine strep, BCx, Expanded respiratory panel negative for viral illness. CT chest to better correlate --> no lobular infiltrate   White count trending down - monitor CBC Procalcitonin 0.8 --> 3.52, will continue azithromycin to cover pneumonia x5 days  Sputum cultures pending   Acquired hypothyroidism Continue Synthroid   CAD (coronary artery disease), native coronary artery No active chest pain Troponin negative x 1 EKG stable Follow  Diabetes mellitus, type II (HCC) Monitor Glc   Hypertension BP elevated Amlodipine, clonidine restarted Continue Lasix  Hyperkalemia - improved  K 5.8 in setting of acute on chronic stage 4 CKD- requiring HD  EKG stable  Monitor closely  Abn CT chest  Mild mediastinal adenopathy, RIGHT hilar adenopathy and RIGHT lower lobe atelectasis. Small RIGHT effusion. Differential include RIGHT hilar malignancy versus benign effusion and atelectasis versus pneumonia. Mild interstitial edema pattern. Recommend clinical correlation and follow-up contrast CT in 4 to 6 weeks.   GERD w/ Hx GI bleed  Protonix   HLD Crestor   Anxiety Alprazolam prn   Hx CVA ASA, statin   Neuropathy and RLS Gabapentin     DVT prophylaxis: heparin  Pertinent IV fluids/nutrition: no continuous IV fluids  Central lines / invasive devices: R IJ Permcath   Code Status: DNR ACP documentation reviewed: 03/06/23 none on file w/ [facility]  Current Admission Status: inpatient   TOC needs / Dispo plan: recs for SNF  Barriers to discharge /  significant pending items: O2 requirement, placement SNF

## 2023-03-07 DIAGNOSIS — J9601 Acute respiratory failure with hypoxia: Secondary | ICD-10-CM | POA: Diagnosis not present

## 2023-03-07 LAB — CULTURE, BLOOD (SINGLE): Special Requests: ADEQUATE

## 2023-03-07 LAB — ECHOCARDIOGRAM COMPLETE
AR max vel: 2.27 cm2
AV Area VTI: 2.16 cm2
AV Area mean vel: 2.19 cm2
AV Mean grad: 7.5 mmHg
AV Peak grad: 13.5 mmHg
Ao pk vel: 1.84 m/s
Area-P 1/2: 3.46 cm2
MV VTI: 2.57 cm2
S' Lateral: 2.9 cm
Weight: 2449.6 oz

## 2023-03-07 LAB — BASIC METABOLIC PANEL
Anion gap: 13 (ref 5–15)
BUN: 56 mg/dL — ABNORMAL HIGH (ref 8–23)
CO2: 22 mmol/L (ref 22–32)
Calcium: 7 mg/dL — ABNORMAL LOW (ref 8.9–10.3)
Chloride: 103 mmol/L (ref 98–111)
Creatinine, Ser: 6.19 mg/dL — ABNORMAL HIGH (ref 0.44–1.00)
GFR, Estimated: 6 mL/min — ABNORMAL LOW (ref >60)
Glucose, Bld: 96 mg/dL (ref 70–99)
Potassium: 4.2 mmol/L (ref 3.5–5.1)
Sodium: 138 mmol/L (ref 135–145)

## 2023-03-07 LAB — LEGIONELLA PNEUMOPHILA SEROGP 1 UR AG: L. pneumophila Serogp 1 Ur Ag: NEGATIVE

## 2023-03-07 LAB — CBC
HCT: 39 % (ref 36.0–46.0)
Hemoglobin: 12 g/dL (ref 12.0–15.0)
MCH: 25.4 pg — ABNORMAL LOW (ref 26.0–34.0)
MCHC: 30.8 g/dL (ref 30.0–36.0)
MCV: 82.5 fL (ref 80.0–100.0)
Platelets: 595 10*3/uL — ABNORMAL HIGH (ref 150–400)
RBC: 4.73 MIL/uL (ref 3.87–5.11)
RDW: 17.9 % — ABNORMAL HIGH (ref 11.5–15.5)
WBC: 17.6 10*3/uL — ABNORMAL HIGH (ref 4.0–10.5)
nRBC: 0 % (ref 0.0–0.2)

## 2023-03-07 LAB — PROCALCITONIN: Procalcitonin: 3.52 ng/mL

## 2023-03-07 LAB — HEPATITIS B SURFACE ANTIBODY, QUANTITATIVE: Hep B S AB Quant (Post): <3.5 m[IU]/mL — ABNORMAL LOW (ref >9.9)

## 2023-03-07 MED ORDER — PANTOPRAZOLE SODIUM 40 MG PO TBEC
40.0000 mg | DELAYED_RELEASE_TABLET | Freq: Every day | ORAL | Status: DC
  Administered 2023-03-07 – 2023-03-13 (×7): 40 mg via ORAL
  Filled 2023-03-07 (×7): qty 1

## 2023-03-07 MED ORDER — AMLODIPINE BESYLATE 10 MG PO TABS
10.0000 mg | ORAL_TABLET | Freq: Every day | ORAL | Status: DC
  Administered 2023-03-07 – 2023-03-13 (×7): 10 mg via ORAL
  Filled 2023-03-07: qty 2
  Filled 2023-03-07 (×7): qty 1

## 2023-03-07 MED ORDER — GABAPENTIN 100 MG PO CAPS
200.0000 mg | ORAL_CAPSULE | Freq: Every day | ORAL | Status: DC
  Administered 2023-03-07 – 2023-03-12 (×6): 200 mg via ORAL
  Filled 2023-03-07 (×6): qty 2

## 2023-03-07 MED ORDER — ACETAMINOPHEN 325 MG PO TABS
650.0000 mg | ORAL_TABLET | Freq: Four times a day (QID) | ORAL | Status: DC | PRN
  Administered 2023-03-07 – 2023-03-12 (×4): 650 mg via ORAL
  Filled 2023-03-07 (×4): qty 2

## 2023-03-07 MED ORDER — VITAMIN D 25 MCG (1000 UNIT) PO TABS
1000.0000 [IU] | ORAL_TABLET | Freq: Every day | ORAL | Status: DC
  Administered 2023-03-07 – 2023-03-13 (×7): 1000 [IU] via ORAL
  Filled 2023-03-07 (×7): qty 1

## 2023-03-07 MED ORDER — ASPIRIN 81 MG PO TBEC
81.0000 mg | DELAYED_RELEASE_TABLET | Freq: Every day | ORAL | Status: DC
  Administered 2023-03-07 – 2023-03-13 (×7): 81 mg via ORAL
  Filled 2023-03-07 (×7): qty 1

## 2023-03-07 MED ORDER — HEPARIN SODIUM (PORCINE) 1000 UNIT/ML IJ SOLN
INTRAMUSCULAR | Status: AC
  Filled 2023-03-07: qty 10

## 2023-03-07 MED ORDER — GABAPENTIN 100 MG PO CAPS
100.0000 mg | ORAL_CAPSULE | Freq: Two times a day (BID) | ORAL | Status: DC
  Administered 2023-03-08 – 2023-03-13 (×10): 100 mg via ORAL
  Filled 2023-03-07 (×10): qty 1

## 2023-03-07 MED ORDER — HYDRALAZINE HCL 10 MG PO TABS: 10.0000 mg | ORAL_TABLET | Freq: Three times a day (TID) | ORAL | Status: DC

## 2023-03-07 MED ORDER — LATANOPROST 0.005 % OP SOLN
1.0000 [drp] | Freq: Every day | OPHTHALMIC | Status: DC
  Administered 2023-03-07 – 2023-03-12 (×6): 1 [drp] via OPHTHALMIC
  Filled 2023-03-07: qty 2.5

## 2023-03-07 MED ORDER — LORATADINE 10 MG PO TABS
10.0000 mg | ORAL_TABLET | Freq: Every day | ORAL | Status: DC
  Administered 2023-03-07 – 2023-03-13 (×7): 10 mg via ORAL
  Filled 2023-03-07 (×8): qty 1

## 2023-03-07 MED ORDER — HYDROXYUREA 300 MG PO CAPS
300.0000 mg | ORAL_CAPSULE | Freq: Every day | ORAL | Status: DC
  Administered 2023-03-09 – 2023-03-12 (×4): 300 mg via ORAL
  Filled 2023-03-07 (×6): qty 1

## 2023-03-07 MED ORDER — ACETAMINOPHEN 325 MG PO TABS
ORAL_TABLET | ORAL | Status: AC
  Filled 2023-03-07: qty 2

## 2023-03-07 MED ORDER — POLYETHYLENE GLYCOL 3350 17 G PO PACK
17.0000 g | PACK | Freq: Every day | ORAL | Status: DC
  Administered 2023-03-07 – 2023-03-13 (×5): 17 g via ORAL
  Filled 2023-03-07 (×5): qty 1

## 2023-03-07 MED ORDER — ALPRAZOLAM 0.25 MG PO TABS
0.2500 mg | ORAL_TABLET | Freq: Two times a day (BID) | ORAL | Status: DC | PRN
  Administered 2023-03-08 – 2023-03-12 (×6): 0.25 mg via ORAL
  Filled 2023-03-07 (×6): qty 1

## 2023-03-07 MED ORDER — CLONIDINE HCL 0.1 MG PO TABS
0.1000 mg | ORAL_TABLET | Freq: Two times a day (BID) | ORAL | Status: DC
  Administered 2023-03-07 – 2023-03-13 (×12): 0.1 mg via ORAL
  Filled 2023-03-07 (×12): qty 1

## 2023-03-07 MED ORDER — FUROSEMIDE 40 MG PO TABS
80.0000 mg | ORAL_TABLET | Freq: Two times a day (BID) | ORAL | Status: DC
  Administered 2023-03-07 – 2023-03-13 (×12): 80 mg via ORAL
  Filled 2023-03-07 (×11): qty 2

## 2023-03-07 NOTE — Progress Notes (Addendum)
Transition of Care [facility]) - Inpatient Brief Assessment   Patient Details  Name: Cassandra [name] MRN: [id number] Date of Birth: 08/12/1927  [facility] CM/SW Contact:    Darolyn Rua, LCSW Phone Number: 03/07/2023, 9:26 AM   Clinical Narrative:  Patient from home with husband, presented to ED with SOB. Patient with hx of CAD, type 2 diabetes, stage IV CKD. Patient currently being treated for acute respiratory failure with hypoxia, volume overload, acute chronic PNA  PCP: Bethann Punches Insurance: Medicare A&B  No needs at this time, TOC will follow for potential oxygen needs at time of discharge as patient is currently on 10L HFNC.  Patient has hx of [facility] PT OT RN and SW, per chart review patient has BSC and shower chair at home, husband provides transport.      Transition of Care Asessment: Insurance and Status: Insurance coverage has been reviewed Patient has primary care physician: Yes Home environment has been reviewed: home with husband Prior level of function:: independent at baseline Prior/Current Home Services: No current home services Social Determinants of Health Reivew: SDOH reviewed no interventions necessary Readmission risk has been reviewed: Yes Transition of care needs: no transition of care needs at this time

## 2023-03-07 NOTE — Progress Notes (Signed)
[facility] [place], [place] 03/07/23  Subjective:   Hospital day # 2 Patient known to our practice from outpatient follow-up for CKD.   Patient seen and evaluated during dialysis   HEMODIALYSIS FLOWSHEET:  Blood Flow Rate (mL/min): 250 mL/min Arterial Pressure (mmHg): -130 mmHg Venous Pressure (mmHg): 160 mmHg TMP (mmHg): 0 mmHg Ultrafiltration Rate (mL/min): 133 mL/min Dialysate Flow Rate (mL/min): 300 ml/min  Tolerating treatment well  Renal: 06/11 0701 - 06/12 0700 In: 338.4 [I.V.:88.4; IV Piggyback:250] Out: 1025 [Urine:1025] Lab Results  Component Value Date   CREATININE 6.19 (H) 03/07/2023   CREATININE 8.52 (H) 03/06/2023   CREATININE 7.71 (H) 03/05/2023     Objective:  Vital signs in last 24 hours:  Temp:  [97.8 F (36.6 C)-99.4 F (37.4 C)] 97.8 F (36.6 C) (06/12 1257) Pulse Rate:  [89-110] 90 (06/12 1451) Resp:  [20-28] 20 (06/12 1451) BP: (154-197)/(69-100) 163/85 (06/12 1400) SpO2:  [88 %-100 %] 93 % (06/12 1451) Weight:  [66.3 kg-68.1 kg] 66.3 kg (06/12 1300)  Weight change: -1.346 kg Filed Weights   03/06/23 1630 03/07/23 1016 03/07/23 1300  Weight: 68.1 kg 66.3 kg 66.3 kg    Intake/Output:    Intake/Output Summary (Last 24 hours) at 03/07/2023 1625 Last data filed at 03/07/2023 1257 Gross per 24 hour  Intake 338.41 ml  Output 450 ml  Net -111.59 ml      Physical Exam: General: NAD  HEENT Normocephalic   Pulm/lungs Bilateral diffuse crackles, HFNC  CVS/Heart regular rate and rhythm   Abdomen:  Soft, nontender, nondistended  Extremities: Dependent edema present  Neurologic: Alert, able to answer questions  Skin: No acute rashes  Access: Rt groin HD temp cath       Basic Metabolic Panel:  Recent Labs  Lab 03/05/23 1146 03/06/23 0609 03/07/23 0505  NA 137 139 138  K 5.8* 6.2* 4.2  CL 109 109 103  CO2 16* 18* 22  GLUCOSE 90 74 96  BUN 77* 81* 56*  CREATININE 7.71* 8.52* 6.19*  CALCIUM 7.7* 7.6*  7.0*      CBC: Recent Labs  Lab 03/05/23 1146 03/06/23 0609 03/07/23 0505  WBC 21.0* 19.5* 17.6*  NEUTROABS 16.0*  --   --   HGB 12.3 11.7* 12.0  HCT 40.1 38.8 39.0  MCV 85.0 85.5 82.5  PLT 690* 652* 595*       Lab Results  Component Value Date   HEPBSAG NON REACTIVE 03/05/2023      Microbiology:  Recent Results (from the past 240 hour(s))  Blood culture (single)     Status: None (Preliminary result)   Collection Time: 03/05/23 11:45 AM   Specimen: BLOOD  Result Value Ref Range Status   Specimen Description BLOOD BLOOD LEFT HAND  Final   Special Requests   Final    BOTTLES DRAWN AEROBIC AND ANAEROBIC Blood Culture adequate volume   Culture   Final    NO GROWTH 2 DAYS Performed at [facility], [address], [place], [place] [address]    Report Status PENDING  Incomplete  SARS Coronavirus 2 by RT PCR (hospital order, performed in [facility] hospital lab) *cepheid single result test* Anterior Nasal Swab     Status: None   Collection Time: 03/05/23 12:02 PM   Specimen: Anterior Nasal Swab  Result Value Ref Range Status   SARS Coronavirus 2 by RT PCR NEGATIVE NEGATIVE Final    Comment: (NOTE) SARS-CoV-2 target nucleic acids are NOT DETECTED.  The SARS-CoV-2 RNA  is generally detectable in upper and lower respiratory specimens during the acute phase of infection. The lowest concentration of SARS-CoV-2 viral copies this assay can detect is 250 copies / mL. A negative result does not preclude SARS-CoV-2 infection and should not be used as the sole basis for treatment or other patient management decisions.  A negative result may occur with improper specimen collection / handling, submission of specimen other than nasopharyngeal swab, presence of viral mutation(s) within the areas targeted by this assay, and inadequate number of viral copies (<250 copies / mL). A negative result must be combined with clinical observations, patient history, and  epidemiological information.  Fact Sheet for Patients:   RoadLapTop.co.za  Fact Sheet for Healthcare Providers: http://kim-miller.com/  This test is not yet approved or  cleared by the [place] FDA and has been authorized for detection and/or diagnosis of SARS-CoV-2 by FDA under an Emergency Use Authorization (EUA).  This EUA will remain in effect (meaning this test can be used) for the duration of the COVID-19 declaration under Section 564(b)(1) of the Act, 21 U.S.C. section 360bbb-3(b)(1), unless the authorization is terminated or revoked sooner.  Performed at [facility], [address] [place], [place] [address]   Respiratory (~20 pathogens) panel by PCR     Status: None   Collection Time: 03/05/23  4:00 PM   Specimen: Nasopharyngeal Swab; Respiratory  Result Value Ref Range Status   Adenovirus NOT DETECTED NOT DETECTED Final   Coronavirus 229E NOT DETECTED NOT DETECTED Final    Comment: (NOTE) The Coronavirus on the Respiratory Panel, DOES NOT test for the novel  Coronavirus (2019 nCoV)    Coronavirus HKU1 NOT DETECTED NOT DETECTED Final   Coronavirus NL63 NOT DETECTED NOT DETECTED Final   Coronavirus OC43 NOT DETECTED NOT DETECTED Final   Metapneumovirus NOT DETECTED NOT DETECTED Final   Rhinovirus / Enterovirus NOT DETECTED NOT DETECTED Final   Influenza A NOT DETECTED NOT DETECTED Final   Influenza B NOT DETECTED NOT DETECTED Final   Parainfluenza Virus 1 NOT DETECTED NOT DETECTED Final   Parainfluenza Virus 2 NOT DETECTED NOT DETECTED Final   Parainfluenza Virus 3 NOT DETECTED NOT DETECTED Final   Parainfluenza Virus 4 NOT DETECTED NOT DETECTED Final   Respiratory Syncytial Virus NOT DETECTED NOT DETECTED Final   Bordetella pertussis NOT DETECTED NOT DETECTED Final   Bordetella Parapertussis NOT DETECTED NOT DETECTED Final   Chlamydophila pneumoniae NOT DETECTED NOT DETECTED Final   Mycoplasma pneumoniae  NOT DETECTED NOT DETECTED Final    Comment: Performed at [facility], [address], [place], [place] [address]    Coagulation Studies: No results for input(s): "LABPROT", "INR" in the last 72 hours.  Urinalysis: No results for input(s): "COLORURINE", "LABSPEC", "PHURINE", "GLUCOSEU", "HGBUR", "BILIRUBINUR", "KETONESUR", "PROTEINUR", "UROBILINOGEN", "NITRITE", "LEUKOCYTESUR" in the last 72 hours.  Invalid input(s): "APPERANCEUR"    Imaging: No results found.   Medications:    anticoagulant sodium citrate     azithromycin Stopped (03/07/23 1550)   cefTRIAXone (ROCEPHIN)  IV Stopped (03/07/23 1440)    amLODipine  10 mg Oral Daily   aspirin EC  81 mg Oral Daily   Chlorhexidine Gluconate Cloth  6 each Topical Q0600   cholecalciferol  1,000 Units Oral Daily   cloNIDine  0.1 mg Oral BID   furosemide  60 mg Intravenous BID   [START ON 03/08/2023] gabapentin  100 mg Oral BID WC   gabapentin  200 mg Oral QHS   heparin  5,000 Units Subcutaneous Q8H   [START ON 03/08/2023] hydroxyurea  300 mg Oral Daily   latanoprost  1 drop Both Eyes QHS   levothyroxine  88 mcg Oral Daily   loratadine  10 mg Oral Daily   pantoprazole  40 mg Oral Daily   polyethylene glycol  17 g Oral Daily   rOPINIRole  0.5 mg Oral QHS   rosuvastatin  20 mg Oral Daily   timolol  1 drop Both Eyes q morning   acetaminophen, albuterol, ALPRAZolam, alteplase, anticoagulant sodium citrate, heparin, lidocaine (PF), lidocaine-prilocaine, ondansetron **OR** ondansetron (ZOFRAN) IV, pentafluoroprop-tetrafluoroeth  Assessment/ Plan:  89 y.o. female with coronary artery disease, diabetes type 2, chronic kidney disease, hyperlipidemia, hypertension, hypothyroidism    admitted on 03/05/2023 for Acute pulmonary edema (HCC) [J81.0] Renal failure [N19] ESRD (end stage renal disease) (HCC) [N18.6] Acute respiratory failure with hypoxia (HCC) [J96.01] Community acquired pneumonia, unspecified laterality  [J18.9]  End-stage renal disease Patient has advanced chronic kidney disease.  Her baseline creatinine is 5.96/GFR 6 from April 2024. She now comes in with volume overload, hyperkalemia and metabolic acidosis.    Appreciate vascular surgery placing HD temp cath. Patient received second dialysis treatment today, no UF. Will provide third treatment tomorrow with low UF.   Vascular consulted to place permcath later this week.   Acute metabolic acidosis Corrected with dialysis  Volume overload/acute respiratory failure Weaned from Bipap to HFNC Continue IV furosemide 60mg  BID  Continue azithromycin and Rocephin for treatment of suspected pneumonia..  Hyperkalemia Corrected with dialysis   Critical care coordination with ER, dialysis, hospitalist, vascular surgery team   LOS: 2 Wendee Beavers 6/12/20244:25 PM  [facility] [place], [place] [phone]

## 2023-03-07 NOTE — ED Notes (Signed)
Dr. Para March messaged via secure chat to notify of increased oxygen demand after receiving dose of xanax. Patient awakens easily to verbal stimuli and when awake oxygen increases to 94% but patient falls back asleep shortly after interaction with oxygen saturation of 92% on 10 L HFNC.

## 2023-03-07 NOTE — Discharge Planning (Signed)
Working on Outpatient hemodialysis placement at [facility].

## 2023-03-07 NOTE — ED Notes (Signed)
Patient incontinent of stool in brief. Peri care performed. Dry brief and new purewick applied. No distress noted at this time.

## 2023-03-07 NOTE — ED Notes (Signed)
Pt repositioned in bed for comfort, denies further needs. Family is at bedside. Call light in reach, WCTM.

## 2023-03-07 NOTE — Progress Notes (Signed)
  Received patient in bed to unit.   Informed consent signed and in chart.    TX duration:2.5hrs     Transported back to ED  Hand-off given to patient's nurse. No c/o and no distress noted    Access used: R Femoral Catheter  Access issues: none   Total UF removed: 0L Medication(s) given: none Post HD VS: 197/90 Post HD weight: 66.3kg     Lynann Beaver  [facility]

## 2023-03-07 NOTE — Progress Notes (Signed)
New Dialysis Start    Patient identified as new dialysis start. Kidney Education packet assembled and given. Discussed the following items with patient:     Current medications and possible changes once started:  Discussed that patient's medications may change over time.  Ex; hypertension medications and diabetes medication.  Nephrologists will adjust as needed.   Fluid restrictions reviewed:  32 oz daily goal:  All liquids count; soups, ice, jello, fruits. Will also refer dietitian.   Phosphorus and potassium: Handout given showing high potassium and phosphorus foods.  Alternative food and drink options given. Will also refer dietitian.   Family support:  Husband of 40 years, patient gets ecstatic when talking about him.    Outpatient Clinic Resources:  Discussed roles of Outpatient clinic staff and advised to make a list of needs, if any, to talk with outpatient staff if needed.   Care plan schedule: Informed patient of Care Plans in outpatient setting and to participate in the care plan.  An invitation would be given from outpatient clinic.    Dialysis Access Options:  Reviewed access options with patients. Discussed in detail about care at home with new AVG & AVF. Reviewed checking bruit and thrill. If dialysis catheter present, educated that patient could not take showers.  Catheter dressing changes were to be done by outpatient clinic staff only.   Home therapy options:  Educated patient about home therapy options:  PD vs home hemo.     Patient verbalized understanding. Will continue to round on patient during admission.    Jean Rosenthal Dialysis Nurse Coordinator [phone]

## 2023-03-07 NOTE — H&P (View-Only) (Signed)
Progress Note    03/07/2023 11:11 AM 2 Days Post-Op  Subjective:  This is a 89 y.o. female extensive medical history if he cannot for coronary artery disease, type 2 diabetes, stage IV CKD, hyperlipidemia, hypertension, hypothyroidism presenting with acute respiratory failure with hypoxia, volume overload, acute on chronic stage IV CKD, pneumonia. Patient reports progressive increased work of breathing over the past 2 to 3 days. Noted baseline stage IV CKD. Still makes some urine.   Patient is now postop day 2 from temporary dialysis catheter placement into right groin.  This was placed in the bed in the emergency department.  Temporary catheter has been working well.  Patient now needs dialysis permacath placed for further dialysis treatment.  Vascular surgery was consulted.  Patient has no complaints.  Vitals all remained stable.   Vitals:   03/07/23 1030 03/07/23 1100  BP: (!) 168/94 (!) 154/99  Pulse: (!) 108 93  Resp: (!) 25 20  Temp:    SpO2: 97% 95%   Physical Exam: Cardiac:  RRR, normal S1, S2, no rubs clicks or gallops or murmurs. Lungs: On auscultation patient has bilateral diffuse rales with some rhonchi.  She remains on high flow nasal cannula oxygen. Incisions: Right groin incision with temporary dialysis catheter.  No complications to note. Extremities: Bilateral lower extremity +2 edema.  Unable to palpate pulses due to edema. Abdomen: Positive bowel sounds throughout, soft, nontender and nondistended. Neurologic: Alert and oriented x 3 and follows commands.  Answers all questions appropriately.  CBC    Component Value Date/Time   WBC 17.6 (H) 03/07/2023 0505   RBC 4.73 03/07/2023 0505   HGB 12.0 03/07/2023 0505   HGB 11.9 (L) 12/07/2012 1641   HCT 39.0 03/07/2023 0505   HCT 37.2 12/07/2012 1641   PLT 595 (H) 03/07/2023 0505   PLT 216 12/07/2012 1641   MCV 82.5 03/07/2023 0505   MCV 88 12/07/2012 1641   MCH 25.4 (L) 03/07/2023 0505   MCHC 30.8 03/07/2023  0505   RDW 17.9 (H) 03/07/2023 0505   RDW 15.7 (H) 12/07/2012 1641   LYMPHSABS 1.4 03/05/2023 1146   LYMPHSABS 1.2 12/07/2012 1641   MONOABS 2.9 (H) 03/05/2023 1146   MONOABS 1.1 (H) 12/07/2012 1641   EOSABS 0.1 03/05/2023 1146   EOSABS 0.2 12/07/2012 1641   BASOSABS 0.1 03/05/2023 1146   BASOSABS 0.1 12/07/2012 1641    BMET    Component Value Date/Time   NA 138 03/07/2023 0505   NA 137 12/07/2012 1641   K 4.2 03/07/2023 0505   K 4.2 12/07/2012 1641   CL 103 03/07/2023 0505   CL 106 12/07/2012 1641   CO2 22 03/07/2023 0505   CO2 27 12/07/2012 1641   GLUCOSE 96 03/07/2023 0505   GLUCOSE 75 12/07/2012 1641   BUN 56 (H) 03/07/2023 0505   BUN 18 12/07/2012 1641   CREATININE 6.19 (H) 03/07/2023 0505   CREATININE 1.13 12/07/2012 1641   CALCIUM 7.0 (L) 03/07/2023 0505   CALCIUM 8.9 12/07/2012 1641   GFRNONAA 6 (L) 03/07/2023 0505   GFRNONAA 45 (L) 12/07/2012 1641   GFRAA 25 (L) 03/30/2020 1026   GFRAA 52 (L) 12/07/2012 1641    INR    Component Value Date/Time   INR 1.1 12/10/2021 2213     Intake/Output Summary (Last 24 hours) at 03/07/2023 1111 Last data filed at 03/06/2023 1942 Gross per 24 hour  Intake 338.41 ml  Output 450 ml  Net -111.59 ml     Assessment/Plan:  89 y.o. female is s/p temporary dialysis catheter to right groin.  2 Days Post-Op   PLAN: Patient will be taken back to the vascular lab on 03/08/2023 for dialysis permacath placement for longer-term outpatient hemodialysis treatment.  I discussed in detail today the procedure, benefits, risks, and complications.  Patient verbalizes her understanding.  I answered all the patient's questions this morning.  Patient would like to proceed as soon as possible.  Patient will be made n.p.o. after midnight.  DVT prophylaxis: Heparin 5000 units subcu every 8 hours   Marcie Bal [facility] 03/07/2023 11:11 AM

## 2023-03-07 NOTE — Plan of Care (Signed)

## 2023-03-07 NOTE — ED Notes (Signed)
Pharmacy contacted due to infiltrated left AC IV. Sheema reported put ice on area.

## 2023-03-07 NOTE — Progress Notes (Signed)
Progress Note    03/07/2023 11:11 AM 2 Days Post-Op  Subjective:  This is a 89 y.o. female extensive medical history if he cannot for coronary artery disease, type 2 diabetes, stage IV CKD, hyperlipidemia, hypertension, hypothyroidism presenting with acute respiratory failure with hypoxia, volume overload, acute on chronic stage IV CKD, pneumonia. Patient reports progressive increased work of breathing over the past 2 to 3 days. Noted baseline stage IV CKD. Still makes some urine.   Patient is now postop day 2 from temporary dialysis catheter placement into right groin.  This was placed in the bed in the emergency department.  Temporary catheter has been working well.  Patient now needs dialysis permacath placed for further dialysis treatment.  Vascular surgery was consulted.  Patient has no complaints.  Vitals all remained stable.   Vitals:   03/07/23 1030 03/07/23 1100  BP: (!) 168/94 (!) 154/99  Pulse: (!) 108 93  Resp: (!) 25 20  Temp:    SpO2: 97% 95%   Physical Exam: Cardiac:  RRR, normal S1, S2, no rubs clicks or gallops or murmurs. Lungs: On auscultation patient has bilateral diffuse rales with some rhonchi.  She remains on high flow nasal cannula oxygen. Incisions: Right groin incision with temporary dialysis catheter.  No complications to note. Extremities: Bilateral lower extremity +2 edema.  Unable to palpate pulses due to edema. Abdomen: Positive bowel sounds throughout, soft, nontender and nondistended. Neurologic: Alert and oriented x 3 and follows commands.  Answers all questions appropriately.  CBC    Component Value Date/Time   WBC 17.6 (H) 03/07/2023 0505   RBC 4.73 03/07/2023 0505   HGB 12.0 03/07/2023 0505   HGB 11.9 (L) 12/07/2012 1641   HCT 39.0 03/07/2023 0505   HCT 37.2 12/07/2012 1641   PLT 595 (H) 03/07/2023 0505   PLT 216 12/07/2012 1641   MCV 82.5 03/07/2023 0505   MCV 88 12/07/2012 1641   MCH 25.4 (L) 03/07/2023 0505   MCHC 30.8 03/07/2023  0505   RDW 17.9 (H) 03/07/2023 0505   RDW 15.7 (H) 12/07/2012 1641   LYMPHSABS 1.4 03/05/2023 1146   LYMPHSABS 1.2 12/07/2012 1641   MONOABS 2.9 (H) 03/05/2023 1146   MONOABS 1.1 (H) 12/07/2012 1641   EOSABS 0.1 03/05/2023 1146   EOSABS 0.2 12/07/2012 1641   BASOSABS 0.1 03/05/2023 1146   BASOSABS 0.1 12/07/2012 1641    BMET    Component Value Date/Time   NA 138 03/07/2023 0505   NA 137 12/07/2012 1641   K 4.2 03/07/2023 0505   K 4.2 12/07/2012 1641   CL 103 03/07/2023 0505   CL 106 12/07/2012 1641   CO2 22 03/07/2023 0505   CO2 27 12/07/2012 1641   GLUCOSE 96 03/07/2023 0505   GLUCOSE 75 12/07/2012 1641   BUN 56 (H) 03/07/2023 0505   BUN 18 12/07/2012 1641   CREATININE 6.19 (H) 03/07/2023 0505   CREATININE 1.13 12/07/2012 1641   CALCIUM 7.0 (L) 03/07/2023 0505   CALCIUM 8.9 12/07/2012 1641   GFRNONAA 6 (L) 03/07/2023 0505   GFRNONAA 45 (L) 12/07/2012 1641   GFRAA 25 (L) 03/30/2020 1026   GFRAA 52 (L) 12/07/2012 1641    INR    Component Value Date/Time   INR 1.1 12/10/2021 2213     Intake/Output Summary (Last 24 hours) at 03/07/2023 1111 Last data filed at 03/06/2023 1942 Gross per 24 hour  Intake 338.41 ml  Output 450 ml  Net -111.59 ml     Assessment/Plan:    89 y.o. female is s/p temporary dialysis catheter to right groin.  2 Days Post-Op   PLAN: Patient will be taken back to the vascular lab on 03/08/2023 for dialysis permacath placement for longer-term outpatient hemodialysis treatment.  I discussed in detail today the procedure, benefits, risks, and complications.  Patient verbalizes her understanding.  I answered all the patient's questions this morning.  Patient would like to proceed as soon as possible.  Patient will be made n.p.o. after midnight.  DVT prophylaxis: Heparin 5000 units subcu every 8 hours   Yoan Sallade R Mabell Esguerra [facility] 03/07/2023 11:11 AM   

## 2023-03-07 NOTE — Progress Notes (Signed)
PROGRESS NOTE    Cassandra [name]   ZOX:[id number] DOB: 1926-11-27  DOA: 03/05/2023 Date of Service: 03/07/23 PCP: Danella Penton, MD     Brief Narrative / Hospital Course:  Cassandra [name] is a 89 y.o. female with medical history significant of coronary artery disease, type 2 diabetes, stage IV CKD, hyperlipidemia, hypertension, hypothyroidism presenting with acute respiratory failure with hypoxia, volume overload, acute on chronic stage IV CKD, pneumonia.  Patient reports progressive increased work of breathing over the past 2 to 3 days.  06/10: ER afebrile, hemodynamically stable, satting in the 60s room air, transition from nonrebreather to BiPAP at 60% FiO2. Given 80 mg IV Lasix. As well as DuoNebs. White count 21, hemoglobin 12, platelets 690, COVID-negative, VBG grossly stable, creatinine 7.7, potassium 5.8. Bicarb 16. BNP of 792. Troponin 48. Chest x-ray with borderline cardiomegaly and interstitial edema as well as hazy by basilar airspace opacities concerning for pneumonia. Vascular surgery placed temporary cath at the bedside. Resp PCR neg. CT chest adenopathy, mild interstitial edema and mild effusion, will need outpatient f/u.  06/11: hemodialysis initiated today  06/12: continue HD, plan permcath placement tomorrow.   Consultants:  Vascular surgery Nephrology   Procedures: 06/10: R femoral temp cath placement for HD       ASSESSMENT & PLAN:   Principal Problem:   Acute respiratory failure with hypoxia (HCC) Active Problems:   Pneumonia   Acute renal failure superimposed on stage 4 chronic kidney disease (HCC)   Diabetes mellitus, type II (HCC)   Hypertension   CAD (coronary artery disease), native coronary artery   Acquired hypothyroidism   ESRD (end stage renal disease) (HCC)   Hyperkalemia   Acute respiratory failure with hypoxia (HCC) d/t fluid overload in setting of renal failure, also complicated by potential pneumonia noted on CXR and supported by  leukocytosis and elevated procalcitonin O2 support noninvasive, pt is DNR  Treat underlying causes as below  Follow respiratory status with diuresis, hemodialysis and pneumonia treatment  Acute renal failure superimposed on stage 4 chronic kidney disease (HCC) S/p temp cath placement in the ER Status post IV Lasix as patient does still make urine Hemodialysis initiated Plan PermCath placement tomorrow 06/13   Questionable Community Acquired Pneumonia CT chest to better correlate --> nothing obvious  White count trending down - monitor CBC Procalcitonin 0.8 --> 3.52, will continue Abx IV Rocephin azithromycin to cover pneumonia  Urine Legionella pending; sputum cultures pending  Urine strep, BCx, Expanded respiratory panel negative for viral illness   Acquired hypothyroidism Continue Synthroid   CAD (coronary artery disease), native coronary artery No active chest pain Troponin negative x 1 EKG stable Follow  Diabetes mellitus, type II (HCC) SSI A1c  Hypertension BP elevated Amlodipine, clonidine restarted Continue Lasix  Hyperkalemia - improved  K 5.8 in setting of acute on chronic stage 4 CKD- requiring HD  EKG stable  Monitor closely  Abn CT chest  Mild mediastinal adenopathy, RIGHT hilar adenopathy and RIGHT lower lobe atelectasis. Small RIGHT effusion. Differential include RIGHT hilar malignancy versus benign effusion and atelectasis versus pneumonia. Mild interstitial edema pattern. Recommend clinical correlation and follow-up contrast CT in 4 to 6 weeks.   GERD w/ Hx GI bleed  Protonix   HLD Crestor   Anxiety Alprazolam prn   Hx CVA ASA, statin   Neuropathy and RLS Gabapentin     DVT prophylaxis: heparin  Pertinent IV fluids/nutrition: no continuous IV fluids  Central lines / invasive devices: R Femoral temp cath  Code Status: DNR ACP documentation reviewed: 03/06/23 none on file w/ [facility]   Current Admission Status: inpatient   TOC needs  / Dispo plan: TBD Barriers to discharge / significant pending items: dialysis today, correction of underlying electrolyte derangements as above, O2 requirement              Subjective / Brief ROS:  Patient reports breathing better, still needing HFNC  Denies CP Pain controlled.  Denies new weakness.  Reports no concerns w/ urination/defecation.   Family Communication: none at this time      Objective Findings:  Vitals:   03/07/23 1300 03/07/23 1338 03/07/23 1353 03/07/23 1400  BP:  (!) 182/100  (!) 163/85  Pulse:  (!) 101 99 90  Resp:  (!) 21 (!) 24 (!) 23  Temp:      TempSrc:      SpO2:  96% 94% 94%  Weight: 66.3 kg       Intake/Output Summary (Last 24 hours) at 03/07/2023 1449 Last data filed at 03/07/2023 1257 Gross per 24 hour  Intake 338.41 ml  Output 450 ml  Net -111.59 ml   Filed Weights   03/06/23 1630 03/07/23 1016 03/07/23 1300  Weight: 68.1 kg 66.3 kg 66.3 kg    Examination:  Physical Exam Constitutional:      General: She is not in acute distress.    Appearance: She is well-developed.  Cardiovascular:     Rate and Rhythm: Normal rate and regular rhythm.  Pulmonary:     Breath sounds: Examination of the right-lower field reveals decreased breath sounds. Examination of the left-lower field reveals decreased breath sounds. Decreased breath sounds present.  Musculoskeletal:     Right lower leg: No edema.     Left lower leg: No edema.  Skin:    General: Skin is warm and dry.  Neurological:     General: No focal deficit present.     Mental Status: She is alert and oriented to person, place, and time.  Psychiatric:        Mood and Affect: Mood normal.        Behavior: Behavior normal.          Scheduled Medications:   amLODipine  10 mg Oral Daily   aspirin EC  81 mg Oral Daily   Chlorhexidine Gluconate Cloth  6 each Topical Q0600   cholecalciferol  1,000 Units Oral Daily   cloNIDine  0.1 mg Oral BID   furosemide  60 mg Intravenous  BID   [START ON 03/08/2023] gabapentin  100 mg Oral BID WC   gabapentin  200 mg Oral QHS   heparin  5,000 Units Subcutaneous Q8H   [START ON 03/08/2023] hydroxyurea  300 mg Oral Daily   latanoprost  1 drop Both Eyes QHS   levothyroxine  88 mcg Oral Daily   loratadine  10 mg Oral Daily   pantoprazole  40 mg Oral Daily   polyethylene glycol  17 g Oral Daily   rOPINIRole  0.5 mg Oral QHS   rosuvastatin  20 mg Oral Daily   timolol  1 drop Both Eyes q morning    Continuous Infusions:  anticoagulant sodium citrate     azithromycin 500 mg (03/07/23 1447)   cefTRIAXone (ROCEPHIN)  IV Stopped (03/07/23 1440)    PRN Medications:  acetaminophen, albuterol, ALPRAZolam, alteplase, anticoagulant sodium citrate, heparin, lidocaine (PF), lidocaine-prilocaine, ondansetron **OR** ondansetron (ZOFRAN) IV, pentafluoroprop-tetrafluoroeth  Antimicrobials from admission:  Anti-infectives (From admission, onward)    Start  Dose/Rate Route Frequency Ordered Stop   03/06/23 1500  azithromycin (ZITHROMAX) 500 mg in sodium chloride 0.9 % 250 mL IVPB        500 mg 250 mL/hr over 60 Minutes Intravenous Every 24 hours 03/05/23 1516 03/10/23 1459   03/06/23 1200  cefTRIAXone (ROCEPHIN) 2 g in sodium chloride 0.9 % 100 mL IVPB        2 g 200 mL/hr over 30 Minutes Intravenous Every 24 hours 03/05/23 1516 03/10/23 1159   03/05/23 1345  cefTRIAXone (ROCEPHIN) 2 g in sodium chloride 0.9 % 100 mL IVPB        2 g 200 mL/hr over 30 Minutes Intravenous  Once 03/05/23 1330 03/05/23 1432   03/05/23 1345  azithromycin (ZITHROMAX) 500 mg in sodium chloride 0.9 % 250 mL IVPB        500 mg 250 mL/hr over 60 Minutes Intravenous  Once 03/05/23 1330 03/05/23 1703           Data Reviewed:  I have personally reviewed the following...  CBC: Recent Labs  Lab 03/05/23 1146 03/06/23 0609 03/07/23 0505  WBC 21.0* 19.5* 17.6*  NEUTROABS 16.0*  --   --   HGB 12.3 11.7* 12.0  HCT 40.1 38.8 39.0  MCV 85.0 85.5 82.5   PLT 690* 652* 595*   Basic Metabolic Panel: Recent Labs  Lab 03/05/23 1146 03/06/23 0609 03/07/23 0505  NA 137 139 138  K 5.8* 6.2* 4.2  CL 109 109 103  CO2 16* 18* 22  GLUCOSE 90 74 96  BUN 77* 81* 56*  CREATININE 7.71* 8.52* 6.19*  CALCIUM 7.7* 7.6* 7.0*   GFR: CrCl cannot be calculated (Unknown ideal weight.). Liver Function Tests: Recent Labs  Lab 03/05/23 1146 03/06/23 0609  AST 23 28  ALT 15 12  ALKPHOS 47 46  BILITOT 0.7 1.0  PROT 6.9 6.5  ALBUMIN 2.6* 2.5*   No results for input(s): "LIPASE", "AMYLASE" in the last 168 hours. No results for input(s): "AMMONIA" in the last 168 hours. Coagulation Profile: No results for input(s): "INR", "PROTIME" in the last 168 hours. Cardiac Enzymes: No results for input(s): "CKTOTAL", "CKMB", "CKMBINDEX", "TROPONINI" in the last 168 hours. BNP (last 3 results) No results for input(s): "PROBNP" in the last 8760 hours. HbA1C: No results for input(s): "HGBA1C" in the last 72 hours. CBG: Recent Labs  Lab 03/06/23 1802 03/06/23 1857 03/06/23 2219  GLUCAP 67* 90 119*   Lipid Profile: No results for input(s): "CHOL", "HDL", "LDLCALC", "TRIG", "CHOLHDL", "LDLDIRECT" in the last 72 hours. Thyroid Function Tests: No results for input(s): "TSH", "T4TOTAL", "FREET4", "T3FREE", "THYROIDAB" in the last 72 hours. Anemia Panel: No results for input(s): "VITAMINB12", "FOLATE", "FERRITIN", "TIBC", "IRON", "RETICCTPCT" in the last 72 hours. Most Recent Urinalysis On File:     Component Value Date/Time   COLORURINE STRAW (A) 12/12/2021 1702   APPEARANCEUR CLEAR (A) 12/12/2021 1702   LABSPEC 1.005 12/12/2021 1702   PHURINE 7.0 12/12/2021 1702   GLUCOSEU 50 (A) 12/12/2021 1702   HGBUR MODERATE (A) 12/12/2021 1702   BILIRUBINUR NEGATIVE 12/12/2021 1702   KETONESUR NEGATIVE 12/12/2021 1702   PROTEINUR 100 (A) 12/12/2021 1702   NITRITE NEGATIVE 12/12/2021 1702   LEUKOCYTESUR SMALL (A) 12/12/2021 1702   Sepsis  Labs: @LABRCNTIP (procalcitonin:4,lacticidven:4) Microbiology: Recent Results (from the past 240 hour(s))  Blood culture (single)     Status: None (Preliminary result)   Collection Time: 03/05/23 11:45 AM   Specimen: BLOOD  Result Value Ref Range Status   Specimen  Description BLOOD BLOOD LEFT HAND  Final   Special Requests   Final    BOTTLES DRAWN AEROBIC AND ANAEROBIC Blood Culture adequate volume   Culture   Final    NO GROWTH 2 DAYS Performed at [facility], [address], [place], [place] [address]    Report Status PENDING  Incomplete  SARS Coronavirus 2 by RT PCR (hospital order, performed in [facility] hospital lab) *cepheid single result test* Anterior Nasal Swab     Status: None   Collection Time: 03/05/23 12:02 PM   Specimen: Anterior Nasal Swab  Result Value Ref Range Status   SARS Coronavirus 2 by RT PCR NEGATIVE NEGATIVE Final    Comment: (NOTE) SARS-CoV-2 target nucleic acids are NOT DETECTED.  The SARS-CoV-2 RNA is generally detectable in upper and lower respiratory specimens during the acute phase of infection. The lowest concentration of SARS-CoV-2 viral copies this assay can detect is 250 copies / mL. A negative result does not preclude SARS-CoV-2 infection and should not be used as the sole basis for treatment or other patient management decisions.  A negative result may occur with improper specimen collection / handling, submission of specimen other than nasopharyngeal swab, presence of viral mutation(s) within the areas targeted by this assay, and inadequate number of viral copies (<250 copies / mL). A negative result must be combined with clinical observations, patient history, and epidemiological information.  Fact Sheet for Patients:   RoadLapTop.co.za  Fact Sheet for Healthcare Providers: http://kim-miller.com/  This test is not yet approved or  cleared by the [place] FDA and has been  authorized for detection and/or diagnosis of SARS-CoV-2 by FDA under an Emergency Use Authorization (EUA).  This EUA will remain in effect (meaning this test can be used) for the duration of the COVID-19 declaration under Section 564(b)(1) of the Act, 21 U.S.C. section 360bbb-3(b)(1), unless the authorization is terminated or revoked sooner.  Performed at [facility], [address] [place], [place] [address]   Respiratory (~20 pathogens) panel by PCR     Status: None   Collection Time: 03/05/23  4:00 PM   Specimen: Nasopharyngeal Swab; Respiratory  Result Value Ref Range Status   Adenovirus NOT DETECTED NOT DETECTED Final   Coronavirus 229E NOT DETECTED NOT DETECTED Final    Comment: (NOTE) The Coronavirus on the Respiratory Panel, DOES NOT test for the novel  Coronavirus (2019 nCoV)    Coronavirus HKU1 NOT DETECTED NOT DETECTED Final   Coronavirus NL63 NOT DETECTED NOT DETECTED Final   Coronavirus OC43 NOT DETECTED NOT DETECTED Final   Metapneumovirus NOT DETECTED NOT DETECTED Final   Rhinovirus / Enterovirus NOT DETECTED NOT DETECTED Final   Influenza A NOT DETECTED NOT DETECTED Final   Influenza B NOT DETECTED NOT DETECTED Final   Parainfluenza Virus 1 NOT DETECTED NOT DETECTED Final   Parainfluenza Virus 2 NOT DETECTED NOT DETECTED Final   Parainfluenza Virus 3 NOT DETECTED NOT DETECTED Final   Parainfluenza Virus 4 NOT DETECTED NOT DETECTED Final   Respiratory Syncytial Virus NOT DETECTED NOT DETECTED Final   Bordetella pertussis NOT DETECTED NOT DETECTED Final   Bordetella Parapertussis NOT DETECTED NOT DETECTED Final   Chlamydophila pneumoniae NOT DETECTED NOT DETECTED Final   Mycoplasma pneumoniae NOT DETECTED NOT DETECTED Final    Comment: Performed at [facility], [address] [place], [place] [address]      Radiology Studies last 3 days: CT CHEST WO CONTRAST  Result Date: 03/05/2023 CLINICAL  DATA:  Respiratory illness. EXAM: CT CHEST  WITHOUT CONTRAST TECHNIQUE: Multidetector CT imaging of the chest was performed following the standard protocol without IV contrast. RADIATION DOSE REDUCTION: This exam was performed according to the departmental dose-optimization program which includes automated exposure control, adjustment of the mA and/or kV according to patient size and/or use of iterative reconstruction technique. COMPARISON:  CT abdomen pelvis 12/11/2021 FINDINGS: Cardiovascular: Coronary artery calcification and aortic atherosclerotic calcification. Mediastinum/Nodes: RIGHT lower paratracheal node measures 11 mm (50/2). There is mild hilar adenopathy surrounding the distal RIGHT bronchus intermedius (image 70/2). No clear obstructing lesion identified. Lungs/Pleura: Moderate RIGHT effusion with RIGHT basilar atelectasis. Small LEFT effusion. Mild interstitial edema pattern with interstitial thickening in LEFT or RIGHT lung. Upper Abdomen: Limited view of the liver, kidneys, pancreas are unremarkable. Normal adrenal glands. Musculoskeletal: No aggressive osseous lesion. IMPRESSION: Mild mediastinal adenopathy, RIGHT hilar adenopathy and RIGHT lower lobe atelectasis. Small RIGHT effusion. Differential include RIGHT hilar malignancy versus benign effusion and atelectasis versus pneumonia. Mild interstitial edema pattern. Recommend clinical correlation and follow-up contrast CT in 4 to 6 weeks. Electronically Signed   By: Genevive Bi M.D.   On: 03/05/2023 17:09   PERIPHERAL VASCULAR CATHETERIZATION  Result Date: 03/05/2023 See surgical note for result.  DG Chest Portable 1 View  Result Date: 03/05/2023 CLINICAL DATA:  Shortness of breath for 2 days. Hypoxia. By protrude wheezing. EXAM: PORTABLE CHEST 1 VIEW COMPARISON:  08/25/2021 FINDINGS: Low lung volumes are present, causing crowding of the pulmonary vasculature. The patient is rotated to the left on today's radiograph, reducing diagnostic sensitivity and specificity. Indistinct  pulmonary vasculature favoring pulmonary venous hypertension and possibly interstitial edema. Hazy basilar airspace opacities. Possible blunting of the costophrenic angles. Mild linear bands of suspected scarring in the left upper lobe. Atherosclerotic calcification of the aortic arch. Borderline cardiomegaly. Thoracic spondylosis. IMPRESSION: 1. Borderline cardiomegaly with pulmonary venous hypertension and possibly interstitial edema. 2. Hazy basilar airspace opacities, possibly from atelectasis or pneumonia. Equivocal blunting of the costophrenic angles. 3. Low lung volumes. 4. Atherosclerosis. 5. Thoracic spondylosis. Electronically Signed   By: Gaylyn Rong M.D.   On: 03/05/2023 13:27             LOS: 2 days    Time spent: 50 min    [name], DO [facility] 03/07/2023, 2:49 PM    Dictation software may have been used to generate the above note. Typos may occur and escape review in typed/dictated notes. Please contact Dr Lyn Hollingshead directly for clarity if needed.  Staff may message me via secure chat in Epic  but this may not receive an immediate response,  please page me for urgent matters!  If 7PM-7AM, please contact night coverage [email or web address]

## 2023-03-08 ENCOUNTER — Encounter: Payer: Self-pay | Admitting: Certified Registered"

## 2023-03-08 ENCOUNTER — Encounter
Admission: EM | Disposition: A | Discharge status: Skilled Nursing Facility | Payer: Self-pay | Source: Home / Self Care | Attending: Osteopathic Medicine

## 2023-03-08 DIAGNOSIS — N186 End stage renal disease: Secondary | ICD-10-CM | POA: Diagnosis not present

## 2023-03-08 DIAGNOSIS — J9601 Acute respiratory failure with hypoxia: Secondary | ICD-10-CM | POA: Diagnosis not present

## 2023-03-08 DIAGNOSIS — Z992 Dependence on renal dialysis: Secondary | ICD-10-CM

## 2023-03-08 HISTORY — PX: DIALYSIS/PERMA CATHETER INSERTION: CATH118288

## 2023-03-08 LAB — RENAL FUNCTION PANEL
Albumin: 2.1 g/dL — ABNORMAL LOW (ref 3.5–5.0)
Anion gap: 12 (ref 5–15)
BUN: 38 mg/dL — ABNORMAL HIGH (ref 8–23)
CO2: 25 mmol/L (ref 22–32)
Calcium: 7.5 mg/dL — ABNORMAL LOW (ref 8.9–10.3)
Chloride: 102 mmol/L (ref 98–111)
Creatinine, Ser: 5.05 mg/dL — ABNORMAL HIGH (ref 0.44–1.00)
GFR, Estimated: 8 mL/min — ABNORMAL LOW (ref >60)
Glucose, Bld: 83 mg/dL (ref 70–99)
Phosphorus: 5.5 mg/dL — ABNORMAL HIGH (ref 2.5–4.6)
Potassium: 3.9 mmol/L (ref 3.5–5.1)
Sodium: 139 mmol/L (ref 135–145)

## 2023-03-08 LAB — CBC
HCT: 37.3 % (ref 36.0–46.0)
Hemoglobin: 11.4 g/dL — ABNORMAL LOW (ref 12.0–15.0)
MCH: 25.5 pg — ABNORMAL LOW (ref 26.0–34.0)
MCHC: 30.6 g/dL (ref 30.0–36.0)
MCV: 83.4 fL (ref 80.0–100.0)
Platelets: 520 10*3/uL — ABNORMAL HIGH (ref 150–400)
RBC: 4.47 MIL/uL (ref 3.87–5.11)
RDW: 17.8 % — ABNORMAL HIGH (ref 11.5–15.5)
WBC: 14.6 10*3/uL — ABNORMAL HIGH (ref 4.0–10.5)
nRBC: 0 % (ref 0.0–0.2)

## 2023-03-08 SURGERY — DIALYSIS/PERMA CATHETER INSERTION
Anesthesia: Moderate Sedation

## 2023-03-08 MED ORDER — METHYLPREDNISOLONE SODIUM SUCC 125 MG IJ SOLR: 125.0000 mg | Freq: Once | INTRAMUSCULAR | Status: DC | PRN

## 2023-03-08 MED ORDER — SODIUM CHLORIDE 0.9 % IV SOLN: INTRAVENOUS | Status: DC

## 2023-03-08 MED ORDER — HEPARIN SODIUM (PORCINE) 10000 UNIT/ML IJ SOLN
INTRAMUSCULAR | Status: AC
  Filled 2023-03-08: qty 1

## 2023-03-08 MED ORDER — MIDAZOLAM HCL 2 MG/2ML IJ SOLN
INTRAMUSCULAR | Status: DC | PRN
  Administered 2023-03-08: .5 mg via INTRAVENOUS

## 2023-03-08 MED ORDER — DIPHENHYDRAMINE HCL 50 MG/ML IJ SOLN: 50.0000 mg | Freq: Once | INTRAMUSCULAR | Status: DC | PRN

## 2023-03-08 MED ORDER — ONDANSETRON HCL 4 MG/2ML IJ SOLN: 4.0000 mg | Freq: Four times a day (QID) | INTRAMUSCULAR | Status: DC | PRN

## 2023-03-08 MED ORDER — HYDROMORPHONE HCL 1 MG/ML IJ SOLN: 1.0000 mg | Freq: Once | INTRAMUSCULAR | Status: DC | PRN

## 2023-03-08 MED ORDER — MIDAZOLAM HCL 2 MG/ML PO SYRP: 8.0000 mg | ORAL_SOLUTION | Freq: Once | ORAL | Status: DC | PRN

## 2023-03-08 MED ORDER — MIDAZOLAM HCL 2 MG/2ML IJ SOLN
INTRAMUSCULAR | Status: AC
  Filled 2023-03-08: qty 2

## 2023-03-08 MED ORDER — RENA-VITE PO TABS
1.0000 | ORAL_TABLET | Freq: Every day | ORAL | Status: DC
  Administered 2023-03-08 – 2023-03-12 (×5): 1 via ORAL
  Filled 2023-03-08 (×6): qty 1

## 2023-03-08 MED ORDER — FAMOTIDINE 20 MG PO TABS: 40.0000 mg | ORAL_TABLET | Freq: Once | ORAL | Status: DC | PRN

## 2023-03-08 MED ORDER — CEFAZOLIN SODIUM-DEXTROSE 1-4 GM/50ML-% IV SOLN: 1.0000 g | INTRAVENOUS | Status: DC

## 2023-03-08 MED ORDER — FENTANYL CITRATE (PF) 100 MCG/2ML IJ SOLN
INTRAMUSCULAR | Status: DC | PRN
  Administered 2023-03-08: 25 ug via INTRAVENOUS

## 2023-03-08 MED ORDER — FENTANYL CITRATE PF 50 MCG/ML IJ SOSY: 12.5000 ug | PREFILLED_SYRINGE | Freq: Once | INTRAMUSCULAR | Status: DC | PRN

## 2023-03-08 MED ORDER — FENTANYL CITRATE PF 50 MCG/ML IJ SOSY
PREFILLED_SYRINGE | INTRAMUSCULAR | Status: AC
  Filled 2023-03-08: qty 1

## 2023-03-08 SURGICAL SUPPLY — 7 items
ADH SKN CLS APL DERMABOND .7 (GAUZE/BANDAGES/DRESSINGS) ×1
CANNULA 5F STIFF (CANNULA) IMPLANT
CATH PALIN MAXID VT KIT 19CM (CATHETERS) IMPLANT
DERMABOND ADVANCED .7 DNX12 (GAUZE/BANDAGES/DRESSINGS) IMPLANT
PACK ANGIOGRAPHY (CUSTOM PROCEDURE TRAY) IMPLANT
SUT MNCRL AB 4-0 PS2 18 (SUTURE) IMPLANT
SUT PROLENE 0 CT 1 30 (SUTURE) IMPLANT

## 2023-03-08 NOTE — Progress Notes (Signed)
PROGRESS NOTE    Cassandra [name]   ZOX:[id number] DOB: May 13, 1927  DOA: 03/05/2023 Date of Service: 03/08/23 PCP: Danella Penton, MD     Brief Narrative / Hospital Course:  Cassandra [name] is a 89 y.o. female with medical history significant of coronary artery disease, type 2 diabetes, stage IV CKD, hyperlipidemia, hypertension, hypothyroidism presenting with acute respiratory failure with hypoxia, volume overload, acute on chronic stage IV CKD, pneumonia.  Patient reports progressive increased work of breathing over the past 2 to 3 days.  06/10: ER afebrile, hemodynamically stable, satting in the 60s room air, transition from nonrebreather to BiPAP at 60% FiO2. Given 80 mg IV Lasix. As well as DuoNebs. White count 21, hemoglobin 12, platelets 690, COVID-negative, VBG grossly stable, creatinine 7.7, potassium 5.8. Bicarb 16. BNP of 792. Troponin 48. Chest x-ray with borderline cardiomegaly and interstitial edema as well as hazy by basilar airspace opacities concerning for pneumonia. Vascular surgery placed temporary cath at the bedside. Resp PCR neg. CT chest adenopathy, mild interstitial edema and mild effusion, will need outpatient f/u.  06/11: hemodialysis initiated today. Requiring HFNC O2 06/12: continue HD, plan permcath placement tomorrow.  06/13: weaning on O2 down to 5L/min. Permcath placement   Consultants:  Vascular surgery Nephrology   Procedures: 06/10: R femoral temp cath placement for HD  06/13 [facility] Permcath placement]      ASSESSMENT & PLAN:   Principal Problem:   Acute respiratory failure with hypoxia (HCC) Active Problems:   Pneumonia   Acute renal failure superimposed on stage 4 chronic kidney disease (HCC)   Diabetes mellitus, type II (HCC)   Hypertension   CAD (coronary artery disease), native coronary artery   Acquired hypothyroidism   ESRD (end stage renal disease) (HCC)   Hyperkalemia   Acute respiratory failure with hypoxia (HCC) d/t fluid  overload in setting of renal failure, also complicated by potential pneumonia noted on CXR and supported by leukocytosis and elevated procalcitonin O2 support noninvasive, pt is DNR  Treat underlying causes as below  Follow respiratory status with diuresis, hemodialysis and pneumonia treatment  Acute renal failure superimposed on stage 4 chronic kidney disease (HCC) S/p temp cath placement in the ER Status post IV Lasix as patient does still make urine Hemodialysis initiated Plan PermCath placement 06/13  Renal navigator currently seeking outpatient dialysis clinic.   Questionable Community Acquired Pneumonia CT chest to better correlate --> no lobular infiltrate   White count trending down - monitor CBC Procalcitonin 0.8 --> 3.52, will continue Abx IV Rocephin azithromycin to cover pneumonia  Urine Legionella pending; sputum cultures pending  Urine strep, BCx, Expanded respiratory panel negative for viral illness   Acquired hypothyroidism Continue Synthroid   CAD (coronary artery disease), native coronary artery No active chest pain Troponin negative x 1 EKG stable Follow  Diabetes mellitus, type II (HCC) SSI A1c  Hypertension BP elevated Amlodipine, clonidine restarted Continue Lasix  Hyperkalemia - improved  K 5.8 in setting of acute on chronic stage 4 CKD- requiring HD  EKG stable  Monitor closely  Abn CT chest  Mild mediastinal adenopathy, RIGHT hilar adenopathy and RIGHT lower lobe atelectasis. Small RIGHT effusion. Differential include RIGHT hilar malignancy versus benign effusion and atelectasis versus pneumonia. Mild interstitial edema pattern. Recommend clinical correlation and follow-up contrast CT in 4 to 6 weeks.   GERD w/ Hx GI bleed  Protonix   HLD Crestor   Anxiety Alprazolam prn   Hx CVA ASA, statin   Neuropathy and RLS Gabapentin  DVT prophylaxis: heparin  Pertinent IV fluids/nutrition: no continuous IV fluids  Central lines /  invasive devices: R Femoral temp cath   Code Status: DNR ACP documentation reviewed: 03/06/23 none on file w/ [facility]   Current Admission Status: inpatient   TOC needs / Dispo plan: TBD Barriers to discharge / significant pending items: dialysis, O2 requirement              Subjective / Brief ROS:  Patient reports breathing better and better each day Denies CP Pain controlled.  Denies new weakness.  Reports no concerns w/ urination/defecation.   Family Communication: none at this time      Objective Findings:  Vitals:   03/08/23 1030 03/08/23 1100 03/08/23 1130 03/08/23 1133  BP: (!) 144/98 (!) 159/90 (!) 144/94 (!) 146/98  Pulse: (!) 105 (!) 107 (!) 107 (!) 106  Resp: (!) 25 18 (!) 21 18  Temp:    98 F (36.7 C)  TempSrc:    Oral  SpO2: 96% 97% 98% 97%  Weight:        Intake/Output Summary (Last 24 hours) at 03/08/2023 1402 Last data filed at 03/08/2023 1133 Gross per 24 hour  Intake 581.24 ml  Output 1500 ml  Net -918.76 ml   Filed Weights   03/07/23 1016 03/07/23 1300 03/08/23 0817  Weight: 66.3 kg 66.3 kg 61 kg    Examination:  Physical Exam Constitutional:      General: She is not in acute distress.    Appearance: She is well-developed.  Cardiovascular:     Rate and Rhythm: Normal rate and regular rhythm.  Pulmonary:     Effort: Pulmonary effort is normal.     Breath sounds: Normal breath sounds.  Musculoskeletal:     Right lower leg: No edema.     Left lower leg: No edema.  Skin:    General: Skin is warm and dry.  Neurological:     General: No focal deficit present.     Mental Status: She is alert and oriented to person, place, and time.  Psychiatric:        Mood and Affect: Mood normal.        Behavior: Behavior normal.          Scheduled Medications:   amLODipine  10 mg Oral Daily   aspirin EC  81 mg Oral Daily   Chlorhexidine Gluconate Cloth  6 each Topical Q0600   cholecalciferol  1,000 Units Oral Daily   cloNIDine  0.1  mg Oral BID   furosemide  80 mg Oral BID   gabapentin  100 mg Oral BID WC   gabapentin  200 mg Oral QHS   heparin  5,000 Units Subcutaneous Q8H   hydroxyurea  300 mg Oral Daily   latanoprost  1 drop Both Eyes QHS   levothyroxine  88 mcg Oral Daily   loratadine  10 mg Oral Daily   multivitamin  1 tablet Oral QHS   pantoprazole  40 mg Oral Daily   polyethylene glycol  17 g Oral Daily   rOPINIRole  0.5 mg Oral QHS   rosuvastatin  20 mg Oral Daily   timolol  1 drop Both Eyes q morning    Continuous Infusions:  sodium chloride     anticoagulant sodium citrate     azithromycin Stopped (03/07/23 1550)    ceFAZolin (ANCEF) IV     cefTRIAXone (ROCEPHIN)  IV 2 g (03/08/23 1237)    PRN Medications:  acetaminophen, albuterol, ALPRAZolam,  alteplase, anticoagulant sodium citrate, diphenhydrAMINE, famotidine, fentaNYL (SUBLIMAZE) injection, heparin, HYDROmorphone (DILAUDID) injection, lidocaine (PF), lidocaine-prilocaine, methylPREDNISolone (SOLU-MEDROL) injection, midazolam, ondansetron **OR** ondansetron (ZOFRAN) IV, pentafluoroprop-tetrafluoroeth  Antimicrobials from admission:  Anti-infectives (From admission, onward)    Start     Dose/Rate Route Frequency Ordered Stop   03/08/23 0803  ceFAZolin (ANCEF) IVPB 1 g/50 mL premix        1 g 100 mL/hr over 30 Minutes Intravenous 30 min pre-op 03/08/23 0803     03/06/23 1500  azithromycin (ZITHROMAX) 500 mg in sodium chloride 0.9 % 250 mL IVPB        500 mg 250 mL/hr over 60 Minutes Intravenous Every 24 hours 03/05/23 1516 03/10/23 1459   03/06/23 1200  cefTRIAXone (ROCEPHIN) 2 g in sodium chloride 0.9 % 100 mL IVPB        2 g 200 mL/hr over 30 Minutes Intravenous Every 24 hours 03/05/23 1516 03/10/23 1159   03/05/23 1345  cefTRIAXone (ROCEPHIN) 2 g in sodium chloride 0.9 % 100 mL IVPB        2 g 200 mL/hr over 30 Minutes Intravenous  Once 03/05/23 1330 03/05/23 1432   03/05/23 1345  azithromycin (ZITHROMAX) 500 mg in sodium chloride 0.9 %  250 mL IVPB        500 mg 250 mL/hr over 60 Minutes Intravenous  Once 03/05/23 1330 03/05/23 1703           Data Reviewed:  I have personally reviewed the following...  CBC: Recent Labs  Lab 03/05/23 1146 03/06/23 0609 03/07/23 0505 03/08/23 0835  WBC 21.0* 19.5* 17.6* 14.6*  NEUTROABS 16.0*  --   --   --   HGB 12.3 11.7* 12.0 11.4*  HCT 40.1 38.8 39.0 37.3  MCV 85.0 85.5 82.5 83.4  PLT 690* 652* 595* 520*   Basic Metabolic Panel: Recent Labs  Lab 03/05/23 1146 03/06/23 0609 03/07/23 0505 03/08/23 0835  NA 137 139 138 139  K 5.8* 6.2* 4.2 3.9  CL 109 109 103 102  CO2 16* 18* 22 25  GLUCOSE 90 74 96 83  BUN 77* 81* 56* 38*  CREATININE 7.71* 8.52* 6.19* 5.05*  CALCIUM 7.7* 7.6* 7.0* 7.5*  PHOS  --   --   --  5.5*   GFR: CrCl cannot be calculated (Unknown ideal weight.). Liver Function Tests: Recent Labs  Lab 03/05/23 1146 03/06/23 0609 03/08/23 0835  AST 23 28  --   ALT 15 12  --   ALKPHOS 47 46  --   BILITOT 0.7 1.0  --   PROT 6.9 6.5  --   ALBUMIN 2.6* 2.5* 2.1*   No results for input(s): "LIPASE", "AMYLASE" in the last 168 hours. No results for input(s): "AMMONIA" in the last 168 hours. Coagulation Profile: No results for input(s): "INR", "PROTIME" in the last 168 hours. Cardiac Enzymes: No results for input(s): "CKTOTAL", "CKMB", "CKMBINDEX", "TROPONINI" in the last 168 hours. BNP (last 3 results) No results for input(s): "PROBNP" in the last 8760 hours. HbA1C: No results for input(s): "HGBA1C" in the last 72 hours. CBG: Recent Labs  Lab 03/06/23 1802 03/06/23 1857 03/06/23 2219  GLUCAP 67* 90 119*   Lipid Profile: No results for input(s): "CHOL", "HDL", "LDLCALC", "TRIG", "CHOLHDL", "LDLDIRECT" in the last 72 hours. Thyroid Function Tests: No results for input(s): "TSH", "T4TOTAL", "FREET4", "T3FREE", "THYROIDAB" in the last 72 hours. Anemia Panel: No results for input(s): "VITAMINB12", "FOLATE", "FERRITIN", "TIBC", "IRON",  "RETICCTPCT" in the last 72 hours. Most Recent  Urinalysis On File:     Component Value Date/Time   COLORURINE STRAW (A) 12/12/2021 1702   APPEARANCEUR CLEAR (A) 12/12/2021 1702   LABSPEC 1.005 12/12/2021 1702   PHURINE 7.0 12/12/2021 1702   GLUCOSEU 50 (A) 12/12/2021 1702   HGBUR MODERATE (A) 12/12/2021 1702   BILIRUBINUR NEGATIVE 12/12/2021 1702   KETONESUR NEGATIVE 12/12/2021 1702   PROTEINUR 100 (A) 12/12/2021 1702   NITRITE NEGATIVE 12/12/2021 1702   LEUKOCYTESUR SMALL (A) 12/12/2021 1702   Sepsis Labs: @LABRCNTIP (procalcitonin:4,lacticidven:4) Microbiology: Recent Results (from the past 240 hour(s))  Blood culture (single)     Status: None (Preliminary result)   Collection Time: 03/05/23 11:45 AM   Specimen: BLOOD  Result Value Ref Range Status   Specimen Description BLOOD BLOOD LEFT HAND  Final   Special Requests   Final    BOTTLES DRAWN AEROBIC AND ANAEROBIC Blood Culture adequate volume   Culture   Final    NO GROWTH 2 DAYS Performed at [facility], [address], [place], [place] [address]    Report Status PENDING  Incomplete  SARS Coronavirus 2 by RT PCR (hospital order, performed in [facility] hospital lab) *cepheid single result test* Anterior Nasal Swab     Status: None   Collection Time: 03/05/23 12:02 PM   Specimen: Anterior Nasal Swab  Result Value Ref Range Status   SARS Coronavirus 2 by RT PCR NEGATIVE NEGATIVE Final    Comment: (NOTE) SARS-CoV-2 target nucleic acids are NOT DETECTED.  The SARS-CoV-2 RNA is generally detectable in upper and lower respiratory specimens during the acute phase of infection. The lowest concentration of SARS-CoV-2 viral copies this assay can detect is 250 copies / mL. A negative result does not preclude SARS-CoV-2 infection and should not be used as the sole basis for treatment or other patient management decisions.  A negative result may occur with improper specimen collection / handling, submission of specimen  other than nasopharyngeal swab, presence of viral mutation(s) within the areas targeted by this assay, and inadequate number of viral copies (<250 copies / mL). A negative result must be combined with clinical observations, patient history, and epidemiological information.  Fact Sheet for Patients:   RoadLapTop.co.za  Fact Sheet for Healthcare Providers: http://kim-miller.com/  This test is not yet approved or  cleared by the [place] FDA and has been authorized for detection and/or diagnosis of SARS-CoV-2 by FDA under an Emergency Use Authorization (EUA).  This EUA will remain in effect (meaning this test can be used) for the duration of the COVID-19 declaration under Section 564(b)(1) of the Act, 21 U.S.C. section 360bbb-3(b)(1), unless the authorization is terminated or revoked sooner.  Performed at [facility], [address] [place], [place] [address]   Respiratory (~20 pathogens) panel by PCR     Status: None   Collection Time: 03/05/23  4:00 PM   Specimen: Nasopharyngeal Swab; Respiratory  Result Value Ref Range Status   Adenovirus NOT DETECTED NOT DETECTED Final   Coronavirus 229E NOT DETECTED NOT DETECTED Final    Comment: (NOTE) The Coronavirus on the Respiratory Panel, DOES NOT test for the novel  Coronavirus (2019 nCoV)    Coronavirus HKU1 NOT DETECTED NOT DETECTED Final   Coronavirus NL63 NOT DETECTED NOT DETECTED Final   Coronavirus OC43 NOT DETECTED NOT DETECTED Final   Metapneumovirus NOT DETECTED NOT DETECTED Final   Rhinovirus / Enterovirus NOT DETECTED NOT DETECTED Final   Influenza A NOT DETECTED NOT DETECTED Final   Influenza B  NOT DETECTED NOT DETECTED Final   Parainfluenza Virus 1 NOT DETECTED NOT DETECTED Final   Parainfluenza Virus 2 NOT DETECTED NOT DETECTED Final   Parainfluenza Virus 3 NOT DETECTED NOT DETECTED Final   Parainfluenza Virus 4 NOT DETECTED NOT DETECTED Final   Respiratory  Syncytial Virus NOT DETECTED NOT DETECTED Final   Bordetella pertussis NOT DETECTED NOT DETECTED Final   Bordetella Parapertussis NOT DETECTED NOT DETECTED Final   Chlamydophila pneumoniae NOT DETECTED NOT DETECTED Final   Mycoplasma pneumoniae NOT DETECTED NOT DETECTED Final    Comment: Performed at [facility], [address], [place], [place] [address]      Radiology Studies last 3 days: ECHOCARDIOGRAM COMPLETE  Result Date: 03/07/2023    ECHOCARDIOGRAM REPORT   Patient Name:   CHARLYNNE Pilley Date of Exam: 03/06/2023 Medical Rec #:  [id number]       Height:       60.0 in Accession #:    [id number]      Weight:       153.1 lb Date of Birth:  09/21/1927      BSA:          1.666 m Patient Age:    89 years        BP:           150/67 mmHg Patient Gender: F               HR:           85 bpm. Exam Location:  [facility] Procedure: 2D Echo, Cardiac Doppler and Color Doppler Indications:     Other cardiac sounds  History:         Patient has prior history of Echocardiogram examinations, most                  recent 10/14/2021. CAD, Stroke; Risk Factors:Hypertension,                  Diabetes and Dyslipidemia. ESRD.  Sonographer:     Mikki Harbor Referring Phys:  [phone] Francoise Schaumann NEWTON Diagnosing Phys: Marcina Millard MD IMPRESSIONS  1. Left ventricular ejection fraction, by estimation, is 60 to 65%. The left ventricle has normal function. The left ventricle has no regional wall motion abnormalities. Left ventricular diastolic parameters are consistent with Grade I diastolic dysfunction (impaired relaxation).  2. Right ventricular systolic function is normal. The right ventricular size is normal. There is normal pulmonary artery systolic pressure.  3. The mitral valve is normal in structure. Mild to moderate mitral valve regurgitation. No evidence of mitral stenosis.  4. The aortic valve is normal in structure. Aortic valve regurgitation is mild to moderate. No aortic stenosis is present.  5. The  inferior vena cava is normal in size with greater than 50% respiratory variability, suggesting right atrial pressure of 3 mmHg. FINDINGS  Left Ventricle: Left ventricular ejection fraction, by estimation, is 60 to 65%. The left ventricle has normal function. The left ventricle has no regional wall motion abnormalities. The left ventricular internal cavity size was normal in size. There is  no left ventricular hypertrophy. Left ventricular diastolic parameters are consistent with Grade I diastolic dysfunction (impaired relaxation). Right Ventricle: The right ventricular size is normal. No increase in right ventricular wall thickness. Right ventricular systolic function is normal. There is normal pulmonary artery systolic pressure. The tricuspid regurgitant velocity is 2.37 m/s, and  with an assumed right atrial pressure of 3 mmHg, the estimated right ventricular systolic  pressure is 25.5 mmHg. Left Atrium: Left atrial size was normal in size. Right Atrium: Right atrial size was normal in size. Pericardium: There is no evidence of pericardial effusion. Mitral Valve: The mitral valve is normal in structure. Mild to moderate mitral valve regurgitation. No evidence of mitral valve stenosis. MV peak gradient, 9.6 mmHg. The mean mitral valve gradient is 3.0 mmHg. Tricuspid Valve: The tricuspid valve is normal in structure. Tricuspid valve regurgitation is not demonstrated. No evidence of tricuspid stenosis. Aortic Valve: The aortic valve is normal in structure. Aortic valve regurgitation is mild to moderate. No aortic stenosis is present. Aortic valve mean gradient measures 7.5 mmHg. Aortic valve peak gradient measures 13.5 mmHg. Aortic valve area, by VTI measures 2.16 cm. Pulmonic Valve: The pulmonic valve was normal in structure. Pulmonic valve regurgitation is not visualized. No evidence of pulmonic stenosis. Aorta: The aortic root is normal in size and structure. Venous: The inferior vena cava is normal in size with  greater than 50% respiratory variability, suggesting right atrial pressure of 3 mmHg. IAS/Shunts: No atrial level shunt detected by color flow Doppler.  LEFT VENTRICLE PLAX 2D LVIDd:         4.60 cm   Diastology LVIDs:         2.90 cm   LV e' medial:    6.53 cm/s LV PW:         1.40 cm   LV E/e' medial:  12.0 LV IVS:        1.20 cm   LV e' lateral:   9.03 cm/s LVOT diam:     2.00 cm   LV E/e' lateral: 8.7 LV SV:         86 LV SV Index:   52 LVOT Area:     3.14 cm  RIGHT VENTRICLE RV Basal diam:  2.75 cm RV Mid diam:    2.70 cm RV S prime:     16.00 cm/s LEFT ATRIUM             Index        RIGHT ATRIUM           Index LA diam:        3.80 cm 2.28 cm/m   RA Area:     13.60 cm LA Vol (A2C):   71.0 ml 42.61 ml/m  RA Volume:   29.80 ml  17.88 ml/m LA Vol (A4C):   42.5 ml 25.51 ml/m LA Biplane Vol: 58.3 ml 34.99 ml/m  AORTIC VALVE                     PULMONIC VALVE AV Area (Vmax):    2.27 cm      PV Vmax:       0.94 m/s AV Area (Vmean):   2.19 cm      PV Peak grad:  3.5 mmHg AV Area (VTI):     2.16 cm AV Vmax:           184.00 cm/s AV Vmean:          124.500 cm/s AV VTI:            0.400 m AV Peak Grad:      13.5 mmHg AV Mean Grad:      7.5 mmHg LVOT Vmax:         133.00 cm/s LVOT Vmean:        86.800 cm/s LVOT VTI:          0.275 m  LVOT/AV VTI ratio: 0.69  AORTA Ao Root diam: 2.90 cm MITRAL VALVE                TRICUSPID VALVE MV Area (PHT): 3.46 cm     TR Peak grad:   22.5 mmHg MV Area VTI:   2.57 cm     TR Vmax:        237.00 cm/s MV Peak grad:  9.6 mmHg MV Mean grad:  3.0 mmHg     SHUNTS MV Vmax:       1.55 m/s     Systemic VTI:  0.28 m MV Vmean:      83.4 cm/s    Systemic Diam: 2.00 cm MV Decel Time: 219 msec MV E velocity: 78.60 cm/s MV A velocity: 148.00 cm/s MV E/A ratio:  0.53 Marcina Millard MD Electronically signed by Marcina Millard MD Signature Date/Time: 03/07/2023/4:39:51 PM    Final    CT CHEST WO CONTRAST  Result Date: 03/05/2023 CLINICAL DATA:  Respiratory illness. EXAM: CT CHEST  WITHOUT CONTRAST TECHNIQUE: Multidetector CT imaging of the chest was performed following the standard protocol without IV contrast. RADIATION DOSE REDUCTION: This exam was performed according to the departmental dose-optimization program which includes automated exposure control, adjustment of the mA and/or kV according to patient size and/or use of iterative reconstruction technique. COMPARISON:  CT abdomen pelvis 12/11/2021 FINDINGS: Cardiovascular: Coronary artery calcification and aortic atherosclerotic calcification. Mediastinum/Nodes: RIGHT lower paratracheal node measures 11 mm (50/2). There is mild hilar adenopathy surrounding the distal RIGHT bronchus intermedius (image 70/2). No clear obstructing lesion identified. Lungs/Pleura: Moderate RIGHT effusion with RIGHT basilar atelectasis. Small LEFT effusion. Mild interstitial edema pattern with interstitial thickening in LEFT or RIGHT lung. Upper Abdomen: Limited view of the liver, kidneys, pancreas are unremarkable. Normal adrenal glands. Musculoskeletal: No aggressive osseous lesion. IMPRESSION: Mild mediastinal adenopathy, RIGHT hilar adenopathy and RIGHT lower lobe atelectasis. Small RIGHT effusion. Differential include RIGHT hilar malignancy versus benign effusion and atelectasis versus pneumonia. Mild interstitial edema pattern. Recommend clinical correlation and follow-up contrast CT in 4 to 6 weeks. Electronically Signed   By: Genevive Bi M.D.   On: 03/05/2023 17:09   PERIPHERAL VASCULAR CATHETERIZATION  Result Date: 03/05/2023 See surgical note for result.  DG Chest Portable 1 View  Result Date: 03/05/2023 CLINICAL DATA:  Shortness of breath for 2 days. Hypoxia. By protrude wheezing. EXAM: PORTABLE CHEST 1 VIEW COMPARISON:  08/25/2021 FINDINGS: Low lung volumes are present, causing crowding of the pulmonary vasculature. The patient is rotated to the left on today's radiograph, reducing diagnostic sensitivity and specificity. Indistinct  pulmonary vasculature favoring pulmonary venous hypertension and possibly interstitial edema. Hazy basilar airspace opacities. Possible blunting of the costophrenic angles. Mild linear bands of suspected scarring in the left upper lobe. Atherosclerotic calcification of the aortic arch. Borderline cardiomegaly. Thoracic spondylosis. IMPRESSION: 1. Borderline cardiomegaly with pulmonary venous hypertension and possibly interstitial edema. 2. Hazy basilar airspace opacities, possibly from atelectasis or pneumonia. Equivocal blunting of the costophrenic angles. 3. Low lung volumes. 4. Atherosclerosis. 5. Thoracic spondylosis. Electronically Signed   By: Gaylyn Rong M.D.   On: 03/05/2023 13:27             LOS: 3 days    Time spent: 50 min    [name], DO [facility] 03/08/2023, 2:02 PM    Dictation software may have been used to generate the above note. Typos may occur and escape review in typed/dictated notes. Please contact Dr Lyn Hollingshead directly for clarity if  needed.  Staff may message me via secure chat in [place]  but this may not receive an immediate response,  please page me for urgent matters!  If 7PM-7AM, please contact night coverage [email or web address]

## 2023-03-08 NOTE — Progress Notes (Signed)
OT Cancellation Note  Patient Details Name: Cassandra [name] MRN: [id number] DOB: 05-Jun-1927   Cancelled Treatment:    Reason Eval/Treat Not Completed: Patient at procedure or test/ unavailable. Consult received, chart reviewed. Pt out of the room for a procedure, per chart scheduled for permcath placement. Will re-attempt OT evaluation at later date/time as pt is appropriate.   Arman Filter., MPH, MS, OTR/L ascom [phone] 03/08/23, 4:05 PM

## 2023-03-08 NOTE — Progress Notes (Signed)
  Received patient in bed to unit.   Informed consent signed and in chart.    TX duration:3 hrs     Transported back to floor  Hand-off given to patient's nurse. No c/o and no distress noted    Access used: R femoral catheter  Access issues: none   Total UF removed: 1.3L Medication(s) given: none Post HD [id number] Post HD weight: 59.7kg     Lynann Beaver  [facility]

## 2023-03-08 NOTE — Progress Notes (Addendum)
Nutrition Education Note  RD consulted for Renal Education.   Pt with history of stage IV CKD. She is new to HD and is NPO for permacath placement with vascular surgery today. Outpatient HD center being arranged.   Attempted to speak with pt, however, not in room at time of visit. No family present.   Addendum (1444): Attempted to speak with pt via phone upon return to room, however, unable to reach pt.   RD provided "Nutrition for People on Dialysis" from [facility] Nutrition Care Manual. Attached to AVS/ discharge summary. RD will attempt to re-visit for in-person education as time allows. Pt will also have support from RD at outpatient HD center.  Current diet order is NPO, patient is consuming approximately n/a% of meals at this time. Labs and medications reviewed. No further nutrition interventions warranted at this time. RD contact information provided. If additional nutrition issues arise, please re-consult RD.  Cassandra [name], RD, LDN, CDCES Registered Dietitian II Certified Diabetes Care and Education Specialist Please refer to [facility] for RD and/or RD on-call/weekend/after hours pager

## 2023-03-08 NOTE — Progress Notes (Signed)
   03/08/23 1300  Spiritual Encounters  Type of Visit Initial  Care provided to: Patient  Referral source Nurse (RN/NT/LPN)  Reason for visit Routine spiritual support  OnCall Visit Yes  Spiritual Framework  Presenting Themes Meaning/purpose/sources of inspiration;Values and beliefs;Significant life change;Community and relationships  Values/beliefs Catholic  Community/Connection Family;Friend(s);Faith community;Spiritual leader  Patient Stress Factors None identified  Family Stress Factors None identified  Interventions  Spiritual Care Interventions Made Established relationship of care and support;Compassionate presence;Reflective listening;Narrative/life review;Encouragement  Intervention Outcomes  Outcomes Connection to spiritual care;Awareness around self/spiritual resourses  Spiritual Care Plan  Spiritual Care Issues Still Outstanding No further spiritual care needs at this time (see row info)   Chaplain was paged to speak with patient. Patient told the chaplain that she just wanted to talk. Chaplain and patient talked about her being Catholic, her family, her new condition with dialysis, and her faith in God. Patient told me she will have her priest come and visit her and prayer with her but that she would like for me to continue to stop by and see her. I told her that I would.

## 2023-03-08 NOTE — Care Management Important Message (Signed)
Important Message  Patient Details  Name: Cassandra [name] MRN: [id number] Date of Birth: [birth year]   Medicare Important Message Given:  Yes     Johnell Comings 03/08/2023, 1:55 PM

## 2023-03-08 NOTE — Interval H&P Note (Signed)
History and Physical Interval Note:  03/08/2023 3:41 PM  Cassandra [name]  has presented today for surgery, with the diagnosis of ESRD.  The various methods of treatment have been discussed with the patient and family. After consideration of risks, benefits and other options for treatment, the patient has consented to  Procedure(s): DIALYSIS/PERMA CATHETER INSERTION (N/A) as a surgical intervention.  The patient's history has been reviewed, patient examined, no change in status, stable for surgery.  I have reviewed the patient's chart and labs.  Questions were answered to the patient's satisfaction.     Festus Barren

## 2023-03-08 NOTE — Progress Notes (Signed)
[facility] [place], [place] 03/08/23  Subjective:   Hospital day # 3 Patient known to our practice from outpatient follow-up for CKD.   Patient seen and evaluated during dialysis   HEMODIALYSIS FLOWSHEET:  Blood Flow Rate (mL/min): 300 mL/min Arterial Pressure (mmHg): -200 mmHg Venous Pressure (mmHg): 160 mmHg TMP (mmHg): 0 mmHg Ultrafiltration Rate (mL/min): 600 mL/min Dialysate Flow Rate (mL/min): 300 ml/min  Receiving third treatment today Tolerating treatment  HFNC 6L  Renal: 06/12 0701 - 06/13 0700 In: 581.2 [P.O.:120; IV Piggyback:461.2] Out: 200 [Urine:200] Lab Results  Component Value Date   CREATININE 5.05 (H) 03/08/2023   CREATININE 6.19 (H) 03/07/2023   CREATININE 8.52 (H) 03/06/2023     Objective:  Vital signs in last 24 hours:  Temp:  [97.8 F (36.6 C)-98.4 F (36.9 C)] 98.4 F (36.9 C) (06/13 0811) Pulse Rate:  [84-110] 105 (06/13 1030) Resp:  [17-27] 25 (06/13 1030) BP: (133-197)/(69-100) 144/98 (06/13 1030) SpO2:  [91 %-100 %] 96 % (06/13 1030) Weight:  [61 kg-66.3 kg] 61 kg (06/13 0817)  Weight change: -1.8 kg Filed Weights   03/07/23 1016 03/07/23 1300 03/08/23 0817  Weight: 66.3 kg 66.3 kg 61 kg    Intake/Output:    Intake/Output Summary (Last 24 hours) at 03/08/2023 1107 Last data filed at 03/08/2023 0648 Gross per 24 hour  Intake 581.24 ml  Output 200 ml  Net 381.24 ml      Physical Exam: General: NAD  HEENT Normocephalic   Pulm/lungs Bilateral diffuse crackles, HFNC  CVS/Heart regular rate and rhythm   Abdomen:  Soft, nontender, nondistended  Extremities: Dependent edema present  Neurologic: Alert, able to answer questions  Skin: No acute rashes  Access: Rt groin HD temp cath       Basic Metabolic Panel:  Recent Labs  Lab 03/05/23 1146 03/06/23 0609 03/07/23 0505 03/08/23 0835  NA 137 139 138 139  K 5.8* 6.2* 4.2 3.9  CL 109 109 103 102  CO2 16* 18* 22 25  GLUCOSE 90 74 96 83  BUN 77*  81* 56* 38*  CREATININE 7.71* 8.52* 6.19* 5.05*  CALCIUM 7.7* 7.6* 7.0* 7.5*  PHOS  --   --   --  5.5*      CBC: Recent Labs  Lab 03/05/23 1146 03/06/23 0609 03/07/23 0505 03/08/23 0835  WBC 21.0* 19.5* 17.6* 14.6*  NEUTROABS 16.0*  --   --   --   HGB 12.3 11.7* 12.0 11.4*  HCT 40.1 38.8 39.0 37.3  MCV 85.0 85.5 82.5 83.4  PLT 690* 652* 595* 520*       Lab Results  Component Value Date   HEPBSAG NON REACTIVE 03/05/2023      Microbiology:  Recent Results (from the past 240 hour(s))  Blood culture (single)     Status: None (Preliminary result)   Collection Time: 03/05/23 11:45 AM   Specimen: BLOOD  Result Value Ref Range Status   Specimen Description BLOOD BLOOD LEFT HAND  Final   Special Requests   Final    BOTTLES DRAWN AEROBIC AND ANAEROBIC Blood Culture adequate volume   Culture   Final    NO GROWTH 2 DAYS Performed at [facility], [address] [place], [place] [address]    Report Status PENDING  Incomplete  SARS Coronavirus 2 by RT PCR (hospital order, performed in [facility] hospital lab) *cepheid single result test* Anterior Nasal Swab     Status: None   Collection Time: 03/05/23 12:02 PM  Specimen: Anterior Nasal Swab  Result Value Ref Range Status   SARS Coronavirus 2 by RT PCR NEGATIVE NEGATIVE Final    Comment: (NOTE) SARS-CoV-2 target nucleic acids are NOT DETECTED.  The SARS-CoV-2 RNA is generally detectable in upper and lower respiratory specimens during the acute phase of infection. The lowest concentration of SARS-CoV-2 viral copies this assay can detect is 250 copies / mL. A negative result does not preclude SARS-CoV-2 infection and should not be used as the sole basis for treatment or other patient management decisions.  A negative result may occur with improper specimen collection / handling, submission of specimen other than nasopharyngeal swab, presence of viral mutation(s) within the areas targeted by this assay, and  inadequate number of viral copies (<250 copies / mL). A negative result must be combined with clinical observations, patient history, and epidemiological information.  Fact Sheet for Patients:   RoadLapTop.co.za  Fact Sheet for Healthcare Providers: http://kim-miller.com/  This test is not yet approved or  cleared by the [place] FDA and has been authorized for detection and/or diagnosis of SARS-CoV-2 by FDA under an Emergency Use Authorization (EUA).  This EUA will remain in effect (meaning this test can be used) for the duration of the COVID-19 declaration under Section 564(b)(1) of the Act, 21 U.S.C. section 360bbb-3(b)(1), unless the authorization is terminated or revoked sooner.  Performed at [facility], [address] [place], [place] [address]   Respiratory (~20 pathogens) panel by PCR     Status: None   Collection Time: 03/05/23  4:00 PM   Specimen: Nasopharyngeal Swab; Respiratory  Result Value Ref Range Status   Adenovirus NOT DETECTED NOT DETECTED Final   Coronavirus 229E NOT DETECTED NOT DETECTED Final    Comment: (NOTE) The Coronavirus on the Respiratory Panel, DOES NOT test for the novel  Coronavirus (2019 nCoV)    Coronavirus HKU1 NOT DETECTED NOT DETECTED Final   Coronavirus NL63 NOT DETECTED NOT DETECTED Final   Coronavirus OC43 NOT DETECTED NOT DETECTED Final   Metapneumovirus NOT DETECTED NOT DETECTED Final   Rhinovirus / Enterovirus NOT DETECTED NOT DETECTED Final   Influenza A NOT DETECTED NOT DETECTED Final   Influenza B NOT DETECTED NOT DETECTED Final   Parainfluenza Virus 1 NOT DETECTED NOT DETECTED Final   Parainfluenza Virus 2 NOT DETECTED NOT DETECTED Final   Parainfluenza Virus 3 NOT DETECTED NOT DETECTED Final   Parainfluenza Virus 4 NOT DETECTED NOT DETECTED Final   Respiratory Syncytial Virus NOT DETECTED NOT DETECTED Final   Bordetella pertussis NOT DETECTED NOT DETECTED Final    Bordetella Parapertussis NOT DETECTED NOT DETECTED Final   Chlamydophila pneumoniae NOT DETECTED NOT DETECTED Final   Mycoplasma pneumoniae NOT DETECTED NOT DETECTED Final    Comment: Performed at [facility], [address], [place], [place] [address]    Coagulation Studies: No results for input(s): "LABPROT", "INR" in the last 72 hours.  Urinalysis: No results for input(s): "COLORURINE", "LABSPEC", "PHURINE", "GLUCOSEU", "HGBUR", "BILIRUBINUR", "KETONESUR", "PROTEINUR", "UROBILINOGEN", "NITRITE", "LEUKOCYTESUR" in the last 72 hours.  Invalid input(s): "APPERANCEUR"    Imaging: ECHOCARDIOGRAM COMPLETE  Result Date: 03/07/2023    ECHOCARDIOGRAM REPORT   Patient Name:   BRIAJA Forget Date of Exam: 03/06/2023 Medical Rec #:  [id number]       Height:       60.0 in Accession #:    [id number]      Weight:       153.1 lb Date of Birth:  11-08-1926  BSA:          1.666 m Patient Age:    89 years        BP:           150/67 mmHg Patient Gender: F               HR:           85 bpm. Exam Location:  [facility] Procedure: 2D Echo, Cardiac Doppler and Color Doppler Indications:     Other cardiac sounds  History:         Patient has prior history of Echocardiogram examinations, most                  recent 10/14/2021. CAD, Stroke; Risk Factors:Hypertension,                  Diabetes and Dyslipidemia. ESRD.  Sonographer:     Mikki Harbor Referring Phys:  [phone] Francoise Schaumann [place] Diagnosing Phys: Marcina Millard MD IMPRESSIONS  1. Left ventricular ejection fraction, by estimation, is 60 to 65%. The left ventricle has normal function. The left ventricle has no regional wall motion abnormalities. Left ventricular diastolic parameters are consistent with Grade I diastolic dysfunction (impaired relaxation).  2. Right ventricular systolic function is normal. The right ventricular size is normal. There is normal pulmonary artery systolic pressure.  3. The mitral valve is normal in structure. Mild to moderate  mitral valve regurgitation. No evidence of mitral stenosis.  4. The aortic valve is normal in structure. Aortic valve regurgitation is mild to moderate. No aortic stenosis is present.  5. The inferior vena cava is normal in size with greater than 50% respiratory variability, suggesting right atrial pressure of 3 mmHg. FINDINGS  Left Ventricle: Left ventricular ejection fraction, by estimation, is 60 to 65%. The left ventricle has normal function. The left ventricle has no regional wall motion abnormalities. The left ventricular internal cavity size was normal in size. There is  no left ventricular hypertrophy. Left ventricular diastolic parameters are consistent with Grade I diastolic dysfunction (impaired relaxation). Right Ventricle: The right ventricular size is normal. No increase in right ventricular wall thickness. Right ventricular systolic function is normal. There is normal pulmonary artery systolic pressure. The tricuspid regurgitant velocity is 2.37 m/s, and  with an assumed right atrial pressure of 3 mmHg, the estimated right ventricular systolic pressure is 25.5 mmHg. Left Atrium: Left atrial size was normal in size. Right Atrium: Right atrial size was normal in size. Pericardium: There is no evidence of pericardial effusion. Mitral Valve: The mitral valve is normal in structure. Mild to moderate mitral valve regurgitation. No evidence of mitral valve stenosis. MV peak gradient, 9.6 mmHg. The mean mitral valve gradient is 3.0 mmHg. Tricuspid Valve: The tricuspid valve is normal in structure. Tricuspid valve regurgitation is not demonstrated. No evidence of tricuspid stenosis. Aortic Valve: The aortic valve is normal in structure. Aortic valve regurgitation is mild to moderate. No aortic stenosis is present. Aortic valve mean gradient measures 7.5 mmHg. Aortic valve peak gradient measures 13.5 mmHg. Aortic valve area, by VTI measures 2.16 cm. Pulmonic Valve: The pulmonic valve was normal in structure.  Pulmonic valve regurgitation is not visualized. No evidence of pulmonic stenosis. Aorta: The aortic root is normal in size and structure. Venous: The inferior vena cava is normal in size with greater than 50% respiratory variability, suggesting right atrial pressure of 3 mmHg. IAS/Shunts: No atrial level shunt detected by color flow Doppler.  LEFT  VENTRICLE PLAX 2D LVIDd:         4.60 cm   Diastology LVIDs:         2.90 cm   LV e' medial:    6.53 cm/s LV PW:         1.40 cm   LV E/e' medial:  12.0 LV IVS:        1.20 cm   LV e' lateral:   9.03 cm/s LVOT diam:     2.00 cm   LV E/e' lateral: 8.7 LV SV:         86 LV SV Index:   52 LVOT Area:     3.14 cm  RIGHT VENTRICLE RV Basal diam:  2.75 cm RV Mid diam:    2.70 cm RV S prime:     16.00 cm/s LEFT ATRIUM             Index        RIGHT ATRIUM           Index LA diam:        3.80 cm 2.28 cm/m   RA Area:     13.60 cm LA Vol (A2C):   71.0 ml 42.61 ml/m  RA Volume:   29.80 ml  17.88 ml/m LA Vol (A4C):   42.5 ml 25.51 ml/m LA Biplane Vol: 58.3 ml 34.99 ml/m  AORTIC VALVE                     PULMONIC VALVE AV Area (Vmax):    2.27 cm      PV Vmax:       0.94 m/s AV Area (Vmean):   2.19 cm      PV Peak grad:  3.5 mmHg AV Area (VTI):     2.16 cm AV Vmax:           184.00 cm/s AV Vmean:          124.500 cm/s AV VTI:            0.400 m AV Peak Grad:      13.5 mmHg AV Mean Grad:      7.5 mmHg LVOT Vmax:         133.00 cm/s LVOT Vmean:        86.800 cm/s LVOT VTI:          0.275 m LVOT/AV VTI ratio: 0.69  AORTA Ao Root diam: 2.90 cm MITRAL VALVE                TRICUSPID VALVE MV Area (PHT): 3.46 cm     TR Peak grad:   22.5 mmHg MV Area VTI:   2.57 cm     TR Vmax:        237.00 cm/s MV Peak grad:  9.6 mmHg MV Mean grad:  3.0 mmHg     SHUNTS MV Vmax:       1.55 m/s     Systemic VTI:  0.28 m MV Vmean:      83.4 cm/s    Systemic Diam: 2.00 cm MV Decel Time: 219 msec MV E velocity: 78.60 cm/s MV A velocity: 148.00 cm/s MV E/A ratio:  0.53 Marcina Millard MD  Electronically signed by Marcina Millard MD Signature Date/Time: 03/07/2023/4:39:51 PM    Final      Medications:    sodium chloride     anticoagulant sodium citrate     azithromycin Stopped (03/07/23 1550)    ceFAZolin (ANCEF) IV  cefTRIAXone (ROCEPHIN)  IV Stopped (03/07/23 1440)    amLODipine  10 mg Oral Daily   aspirin EC  81 mg Oral Daily   Chlorhexidine Gluconate Cloth  6 each Topical Q0600   cholecalciferol  1,000 Units Oral Daily   cloNIDine  0.1 mg Oral BID   furosemide  80 mg Oral BID   gabapentin  100 mg Oral BID WC   gabapentin  200 mg Oral QHS   heparin  5,000 Units Subcutaneous Q8H   hydroxyurea  300 mg Oral Daily   latanoprost  1 drop Both Eyes QHS   levothyroxine  88 mcg Oral Daily   loratadine  10 mg Oral Daily   multivitamin  1 tablet Oral QHS   pantoprazole  40 mg Oral Daily   polyethylene glycol  17 g Oral Daily   rOPINIRole  0.5 mg Oral QHS   rosuvastatin  20 mg Oral Daily   timolol  1 drop Both Eyes q morning   acetaminophen, albuterol, ALPRAZolam, alteplase, anticoagulant sodium citrate, diphenhydrAMINE, famotidine, fentaNYL (SUBLIMAZE) injection, heparin, HYDROmorphone (DILAUDID) injection, lidocaine (PF), lidocaine-prilocaine, methylPREDNISolone (SOLU-MEDROL) injection, midazolam, ondansetron **OR** ondansetron (ZOFRAN) IV, pentafluoroprop-tetrafluoroeth  Assessment/ Plan:  89 y.o. female with coronary artery disease, diabetes type 2, chronic kidney disease, hyperlipidemia, hypertension, hypothyroidism    admitted on 03/05/2023 for Acute pulmonary edema (HCC) [J81.0] Renal failure [N19] ESRD (end stage renal disease) (HCC) [N18.6] Acute respiratory failure with hypoxia (HCC) [J96.01] Community acquired pneumonia, unspecified laterality [J18.9]  End-stage renal disease Patient has advanced chronic kidney disease.  Her baseline creatinine is 5.96/GFR 6 from April 2024. She now comes in with volume overload, hyperkalemia and metabolic acidosis.     Patient receiving dialysis today, UF 0.5-1L as tolerated. Appreciate vascular planning to place permcath later today.   Next treatment scheduled for Saturday  Renal navigator currently seeking outpatient dialysis clinic.   Acute metabolic acidosis Corrected with dialysis  Volume overload/acute respiratory failure Weaned from Bipap to 6L HFNC Continue IV furosemide 60mg  BID Continue azithromycin and Rocephin for treatment of pneumonia..  Hyperkalemia Corrected with dialysis   Critical care coordination with ER, dialysis, hospitalist, vascular surgery team   LOS: 3 [facility] 6/13/202411:07 AM  [facility] [place], [place] [phone]

## 2023-03-08 NOTE — Op Note (Signed)
OPERATIVE NOTE    PRE-OPERATIVE DIAGNOSIS: 1. ESRD   POST-OPERATIVE DIAGNOSIS: same as above  PROCEDURE: Ultrasound guidance for vascular access to the right internal jugular vein Fluoroscopic guidance for placement of catheter Placement of a 19 cm tip to cuff tunneled hemodialysis catheter via the right internal jugular vein  SURGEON: Festus Barren, MD  ANESTHESIA:  Local with Moderate conscious sedation for approximately 18 minutes using 0.5 mg of Versed and 25 mcg of Fentanyl  ESTIMATED BLOOD LOSS: 3 cc  FLUORO TIME: less than one minute  CONTRAST: none  FINDING(S): 1.  Patent right internal jugular vein  SPECIMEN(S):  None  INDICATIONS:   Cassandra [name] is a 89 y.o.female who presents with renal failure.  The patient needs long term dialysis access for their ESRD, and a Permcath is necessary.  Risks and benefits are discussed and informed consent is obtained.    DESCRIPTION: After obtaining full informed written consent, the patient was brought back to the vascular suited. The patient's right neck and chest were sterilely prepped and draped in a sterile surgical field was created. Moderate conscious sedation was administered during a face to face encounter with the patient throughout the procedure with my supervision of the RN administering medicines and monitoring the patient's vital signs, pulse oximetry, telemetry and mental status throughout from the start of the procedure until the patient was taken to the recovery room.  The right internal jugular vein was visualized with ultrasound and found to be patent. It was then accessed under direct ultrasound guidance and a permanent image was recorded. A wire was placed. After skin nick and dilatation, the peel-away sheath was placed over the wire. I then turned my attention to an area under the clavicle. Approximately 1-2 fingerbreadths below the clavicle a small counterincision was created and tunneled from the subclavicular  incision to the access site. Using fluoroscopic guidance, a 19 centimeter tip to cuff tunneled hemodialysis catheter was selected, and tunneled from the subclavicular incision to the access site. It was then placed through the peel-away sheath and the peel-away sheath was removed. Using fluoroscopic guidance the catheter tips were parked in the right atrium. The appropriate distal connectors were placed. It withdrew blood well and flushed easily with heparinized saline and a concentrated heparin solution was then placed. It was secured to the chest wall with 2 Prolene sutures. The access incision was closed single 4-0 Monocryl. A 4-0 Monocryl pursestring suture was placed around the exit site. Sterile dressings were placed. The patient tolerated the procedure well and was taken to the recovery room in stable condition.  COMPLICATIONS: None  CONDITION: Stable  Festus Barren, MD 03/08/2023 4:29 PM   This note was created with Dragon Medical transcription system. Any errors in dictation are purely unintentional.

## 2023-03-09 ENCOUNTER — Encounter: Payer: Self-pay | Admitting: Vascular Surgery

## 2023-03-09 DIAGNOSIS — J9601 Acute respiratory failure with hypoxia: Secondary | ICD-10-CM | POA: Diagnosis not present

## 2023-03-09 MED ORDER — NEPRO/CARBSTEADY PO LIQD
237.0000 mL | Freq: Two times a day (BID) | ORAL | Status: DC
  Administered 2023-03-10 – 2023-03-13 (×6): 237 mL via ORAL

## 2023-03-09 NOTE — TOC Progression Note (Signed)
Transition of Care [facility]) - Progression Note    Patient Details  Name: Cassandra [name] MRN: [id number] Date of Birth: [birth year]  [facility]) CM/SW Contact  Kreg Shropshire, RN Phone Number: 03/09/2023, 4:01 PM  Clinical Narrative:    Cm spoke with patient and spouse. PT/OT requested SNF. Patient declined SNF and would like to go to [facility] where she was a previous patient. She has walker, cane, and BSC. Declined Wheelchair at this time. Sent referral to [employer] of [facility].  Husband available to help at home and provide transportation.    Expected Discharge Plan: Home w Home Health Services    Expected Discharge Plan and Services                         DME Arranged:  (May need home oxygen)   Date DME Agency Contacted: 03/09/23 (sent referral awaiting response) Time DME Agency Contacted: 1557 Representative spoke with at DME Agency: Mayra Reel of [facility] HH Arranged: PT, OT [facility] Agency: [facility] Date [facility] Agency Contacted: 03/09/23 Time HH Agency Contacted: 1559 Representative spoke with at [facility] Agency: Amy Hyatt   Social Determinants of Health (SDOH) Interventions SDOH Screenings   Food Insecurity: No Food Insecurity (03/06/2023)  Housing: Low Risk  (03/06/2023)  Transportation Needs: No Transportation Needs (03/06/2023)  Utilities: Not At Risk (03/06/2023)  Tobacco Use: Medium Risk (03/09/2023)    Readmission Risk Interventions     No data to display

## 2023-03-09 NOTE — Evaluation (Signed)
Physical Therapy Evaluation Patient Details Name: Cassandra [name] MRN: [id number] DOB: 1927/07/28 Today's Date: 03/09/2023  History of Present Illness  Pt is a 89 y.o. female presenting to hospital 03/05/23 with c/o SOB for about the past week; has been coughing and weak.  Pt admitted with acute respiratory failure with hypoxia, PNA, acute renal failure superimposed on stage 4 CKD, acquired hypothyroidism, and hyperkalemia.  R femoral temp cath placement for HD 6/10.  R IJ permcath placed 03/08/23.  PMH includes CAD, DM, stage IV CKD, HLD, htn, hypothyroidism, foot sx, L knee arthroscopy with meniscectomy.  Clinical Impression  Prior to hospital admission, pt reports being ambulatory with 4ww; lives with her husband in 1st floor apt (level entry).  Currently pt is min assist with transfers and taking steps in place with UE support on RW; pt requiring assist for balance d/t posterior lean in standing (even with vc's to correct); limited activity d/t pt fatigue/generalized weakness.  Pt would currently benefit from skilled PT to address noted impairments and functional limitations (see below for any additional details).  Upon hospital discharge, pt would benefit from ongoing therapy; pt requesting home discharge.    Recommendations for follow up therapy are one component of a multi-disciplinary discharge planning process, led by the attending physician.  Recommendations may be updated based on patient status, additional functional criteria and insurance authorization.        Assistance Recommended at Discharge Frequent or constant Supervision/Assistance  Patient can return home with the following  A lot of help with walking and/or transfers;A little help with bathing/dressing/bathroom;Assistance with cooking/housework;Assist for transportation;Help with stairs or ramp for entrance    Equipment Recommendations Rolling walker (2 wheels) (youth sized)  Recommendations for Other Services  OT consult     Functional Status Assessment Patient has had a recent decline in their functional status and demonstrates the ability to make significant improvements in function in a reasonable and predictable amount of time.     Precautions / Restrictions Precautions Precautions: Fall Restrictions Weight Bearing Restrictions: No      Mobility  Bed Mobility Overal bed mobility: Needs Assistance Bed Mobility: Sit to Supine       Sit to supine: Supervision   General bed mobility comments: SBA for safety    Transfers Overall transfer level: Needs assistance Equipment used: Rolling walker (2 wheels) Transfers: Sit to/from Stand Sit to Stand: Min assist   Step pivot transfers: Min assist (recliner to bed with RW use)       General transfer comment: x2 trials standing from recliner and x2 trials standing from bed; vc's for UE/LE placement; assist to stand and control descent sitting    Ambulation/Gait Ambulation/Gait assistance: Min assist Gait Distance (Feet):  (2x10 stepping in place B LE's) Assistive device: Rolling walker (2 wheels)   Gait velocity: decreased     General Gait Details: decreased B LE step height; assist/cueing for balance d/t posterior lean  Stairs            Wheelchair Mobility    Modified Rankin (Stroke Patients Only)       Balance Overall balance assessment: Needs assistance Sitting-balance support: No upper extremity supported, Feet supported Sitting balance-Leahy Scale: Good Sitting balance - Comments: steady reaching within BOS   Standing balance support: Bilateral upper extremity supported, Reliant on assistive device for balance Standing balance-Leahy Scale: Poor Standing balance comment: posterior lean noted in standing activities requiring assist and cueing to correct  Pertinent Vitals/Pain Pain Assessment Pain Assessment: Faces Faces Pain Scale: Hurts a little bit Pain Location: R IJ permcath  site Pain Descriptors / Indicators: Tender, Sore Pain Intervention(s): Limited activity within patient's tolerance, Monitored during session, Repositioned Vitals (HR and O2 on 5 L via nasal cannula) stable and WFL throughout treatment session.    Home Living Family/patient expects to be discharged to:: Private residence Living Arrangements: Spouse/significant other Available Help at Discharge: Family;Available 24 hours/day Type of Home: Apartment (1st floor) Home Access: Level entry       Home Layout: One level Home Equipment: Rollator (4 wheels);Shower seat      Prior Function Prior Level of Function : Needs assist       Physical Assist : Mobility (physical);ADLs (physical)     Mobility Comments: Pt reports being ambulatory with rollator; husband assists with "heavy things"; pt reports no recent falls. ADLs Comments: Spouse assists with showers; pt can dress herself.     Hand Dominance   Dominant Hand: Right    Extremity/Trunk Assessment   Upper Extremity Assessment Upper Extremity Assessment: Generalized weakness    Lower Extremity Assessment Lower Extremity Assessment: Generalized weakness       Communication   Communication: No difficulties  Cognition Arousal/Alertness: Awake/alert Behavior During Therapy: WFL for tasks assessed/performed Overall Cognitive Status: Within Functional Limits for tasks assessed                                 General Comments: A&Ox4; decreased awareness of safety and impairments with mobility        General Comments  Nursing cleared pt for participation in physical therapy.  Pt agreeable to PT session.    Exercises  Transfer training   Assessment/Plan    PT Assessment Patient needs continued PT services  PT Problem List Decreased strength;Decreased activity tolerance;Decreased balance;Decreased mobility;Decreased knowledge of use of DME;Decreased safety awareness;Decreased knowledge of precautions;Pain        PT Treatment Interventions DME instruction;Gait training;Functional mobility training;Therapeutic activities;Therapeutic exercise;Balance training;Patient/family education    PT Goals (Current goals can be found in the Care Plan section)  Acute Rehab PT Goals Patient Stated Goal: to improve strength and mobility PT Goal Formulation: With patient Time For Goal Achievement: 03/23/23 Potential to Achieve Goals: Good    Frequency Min 4X/week     Co-evaluation               AM-PAC PT "6 Clicks" Mobility  Outcome Measure Help needed turning from your back to your side while in a flat bed without using bedrails?: None Help needed moving from lying on your back to sitting on the side of a flat bed without using bedrails?: A Little Help needed moving to and from a bed to a chair (including a wheelchair)?: A Little Help needed standing up from a chair using your arms (e.g., wheelchair or bedside chair)?: A Little Help needed to walk in hospital room?: A Lot Help needed climbing 3-5 steps with a railing? : A Lot 6 Click Score: 17    End of Session Equipment Utilized During Treatment: Gait belt Activity Tolerance: Patient limited by fatigue Patient left: in bed;with call bell/phone within reach;with bed alarm set;with nursing/sitter in room;Other (comment) (fall mat in place; NT present placing purewick) Nurse Communication: Mobility status;Precautions PT Visit Diagnosis: Unsteadiness on feet (R26.81);Other abnormalities of gait and mobility (R26.89);Muscle weakness (generalized) (M62.81);Pain Pain - Right/Left:  (R IJ permcath site)  Time: 4098-1191 PT Time Calculation (min) (ACUTE ONLY): 22 min   Charges:   PT Evaluation $PT Eval Low Complexity: 1 Low PT Treatments $Therapeutic Activity: 8-22 mins       Hendricks Limes, PT 03/09/23, 11:59 AM

## 2023-03-09 NOTE — Evaluation (Signed)
Occupational Therapy Evaluation Patient Details Name: Cassandra [name] MRN: [id number] DOB: 01-29-1927 Today's Date: 03/09/2023   History of Present Illness Cassandra [name] is a 89 y.o. female with medical history significant of coronary artery disease, type 2 diabetes, stage IV CKD, hyperlipidemia, hypertension, hypothyroidism presenting with acute respiratory failure with hypoxia, volume overload, acute on chronic stage IV CKD, pneumonia.  Patient reports progressive increased work of breathing over the past 2 to 3 days. 6/13 permcath placement.   Clinical Impression   Patient presenting with decreased strength, limited activity tolerance, global weakness and decreased performance in ADLs and functional transfers. Patient lives with spouse PTA, she reports he provides physical assist for transfers and lifting items but she can perform basic self-care tasks such as dressing herself seated EOB. She stopped driving awhile ago, but goes to the grocery store with her husband. Patient currently functioning below baseline, requiring 5L SpO2% via [place] with sats varying from 87% 99% with activity. Pt does not utilize O2 at home. MIN A required for functional transfers and MIN A - CGA for ADL performance. Mild cognitive deficits with decreased safety awareness and increased cuing needed for RW mgmt/hand placement. Patient will benefit from acute OT to increase overall independence in the areas of ADLs, functional mobility, energy conservation techniques in order to safely discharge.       Recommendations for follow up therapy are one component of a multi-disciplinary discharge planning process, led by the attending physician.  Recommendations may be updated based on patient status, additional functional criteria and insurance authorization.   Assistance Recommended at Discharge Frequent or constant Supervision/Assistance  Patient can return home with the following A little help with walking and/or transfers;A  little help with bathing/dressing/bathroom;Assistance with cooking/housework;Direct supervision/assist for medications management;Direct supervision/assist for financial management;Assist for transportation    Functional Status Assessment  Patient has had a recent decline in their functional status and demonstrates the ability to make significant improvements in function in a reasonable and predictable amount of time.  Equipment Recommendations  Other (comment) (Defer to next LOC)       Precautions / Restrictions Precautions Precautions: Fall Restrictions Weight Bearing Restrictions: No      Mobility Bed Mobility Overal bed mobility: Needs Assistance Bed Mobility: Supine to Sit     Supine to sit: Min guard (Use of bed rails and HHA to come fully to seated position)     General bed mobility comments: Increased time and vcs for sequencing of BLE    Transfers Overall transfer level: Needs assistance Equipment used: 1 person hand held assist Transfers: Sit to/from Stand, Bed to chair/wheelchair/BSC Sit to Stand: Min assist Stand pivot transfers: Min assist         General transfer comment: Squat pivot performed      Balance Overall balance assessment: Needs assistance Sitting-balance support: Feet supported, Single extremity supported Sitting balance-Leahy Scale: Fair     Standing balance support: Bilateral upper extremity supported, Reliant on assistive device for balance Standing balance-Leahy Scale: Fair Standing balance comment: STS from EOB with MIN A, cuing to place hands on bed vs pulling back from RW                           ADL either performed or assessed with clinical judgement   ADL Overall ADL's : Needs assistance/impaired Eating/Feeding: Sitting;Set up                   Lower Body Dressing:  Sitting/lateral leans;Minimal assistance (Pt performs sock donning/doffing seated EOB with min A for unsupported seated balance)   Toilet  Transfer: Minimal assistance;Stand-pivot;Cueing for sequencing (Bed to recliner stand pivot) Toilet Transfer Details (indicate cue type and reason): min vcs for sequencing and feet placement         Functional mobility during ADLs: Minimal assistance;Rolling walker (2 wheels) (Pt steps forwards/backwards and laterally with RW and vcs) General ADL Comments: Pt functioning below baseline with current ADL performance. Pt eating sitting up in recliner with setup, dons socks seated EOB with min A for trunk support, and anticipate overall ADL performance for UB CGA with LB CGA-MIN A at this time. Pt with mild decreased safety awareness with mgmt of RW, and needed increased cuing to perform STD from EOB pushing up from bed instead of pulling RW.     Vision Baseline Vision/History: 1 Wears glasses Ability to See in Adequate Light: 0 Adequate              Pertinent Vitals/Pain Pain Assessment Pain Assessment: Faces Faces Pain Scale: Hurts a little bit Pain Location: R clavicle Pain Descriptors / Indicators: Pins and needles Pain Intervention(s): Limited activity within patient's tolerance, Repositioned     Hand Dominance Right   Extremity/Trunk Assessment Upper Extremity Assessment Upper Extremity Assessment: Generalized weakness   Lower Extremity Assessment Lower Extremity Assessment: Generalized weakness;Defer to PT evaluation       Communication Communication Communication: No difficulties   Cognition Arousal/Alertness: Awake/alert Behavior During Therapy: WFL for tasks assessed/performed Overall Cognitive Status: Within Functional Limits for tasks assessed (Follows 1-2 step commands, decreased safety awareness with RW mgmt and transfers)                                                  Home Living Family/patient expects to be discharged to:: Private residence Living Arrangements: Spouse/significant other Available Help at Discharge: Family;Available 24  hours/day Type of Home: Apartment Home Access: Level entry     Home Layout: One level     Bathroom Shower/Tub: Producer, television/film/video: Standard Bathroom Accessibility: Yes How Accessible: Accessible via walker Home Equipment: Rollator (4 wheels);Shower seat - built in;Grab bars - tub/shower          Prior Functioning/Environment Prior Level of Function : Needs assist       Physical Assist : Mobility (physical);ADLs (physical)     Mobility Comments: Pt states her husband assists with transfers, lifting items and helping with IADLs ADLs Comments: Spouse assists with showers, pt can dress herself        OT Problem List: Decreased strength;Decreased activity tolerance;Impaired balance (sitting and/or standing);Decreased safety awareness;Decreased knowledge of use of DME or AE;Cardiopulmonary status limiting activity      OT Treatment/Interventions: Self-care/ADL training;Therapeutic exercise;Energy conservation;DME and/or AE instruction;Therapeutic activities;Balance training;Patient/family education    OT Goals(Current goals can be found in the care plan section) Acute Rehab OT Goals Patient Stated Goal: To get better OT Goal Formulation: With patient Time For Goal Achievement: 03/23/23 Potential to Achieve Goals: Good  OT Frequency: Min 2X/week       AM-PAC OT "6 Clicks" Daily Activity     Outcome Measure Help from another person eating meals?: None Help from another person taking care of personal grooming?: A Little Help from another person toileting, which includes using toliet, bedpan, or urinal?:  A Little Help from another person bathing (including washing, rinsing, drying)?: A Little Help from another person to put on and taking off regular upper body clothing?: A Little Help from another person to put on and taking off regular lower body clothing?: A Little 6 Click Score: 19   End of Session Equipment Utilized During Treatment: Oxygen;Rolling  walker (2 wheels);Gait belt (Pt on 5L Sp02% via [place] throughout OT eval. O2 sats varied from 87%-99% with activity. Dropped after transfer, but returned to 98% quickly with breathing techniques.) Nurse Communication: Mobility status;Other (comment) (NT aware of O2%, mobility status and current positioning.)  Activity Tolerance: Patient tolerated treatment well Patient left: in chair;with call bell/phone within reach;with chair alarm set  OT Visit Diagnosis: Unsteadiness on feet (R26.81);Other abnormalities of gait and mobility (R26.89);Repeated falls (R29.6)                Time: 1610-9604 OT Time Calculation (min): 30 min Charges:  OT General Charges $OT Visit: 1 Visit OT Evaluation $OT Eval Low Complexity: 1 Low OT Treatments $Self Care/Home Management : 8-22 mins Adama Ferber L. Falecia Vannatter, OTR/L  03/09/23, 10:16 AM

## 2023-03-09 NOTE — Progress Notes (Signed)
PROGRESS NOTE    Cassandra [name]   ZOX:[id number] DOB: 1927-06-08  DOA: 03/05/2023 Date of Service: 03/09/23 PCP: Danella Penton, MD     Brief Narrative / Hospital Course:  Cassandra [name] is a 89 y.o. female with medical history significant of coronary artery disease, type 2 diabetes, stage IV CKD, hyperlipidemia, hypertension, hypothyroidism presenting with acute respiratory failure with hypoxia, volume overload, acute on chronic stage IV CKD, pneumonia.  Patient reports progressive increased work of breathing over the past 2 to 3 days.  06/10: ER afebrile, hemodynamically stable, satting in the 60s room air, transition from nonrebreather to BiPAP at 60% FiO2. Given 80 mg IV Lasix. As well as DuoNebs. White count 21, hemoglobin 12, platelets 690, COVID-negative, VBG grossly stable, creatinine 7.7, potassium 5.8. Bicarb 16. BNP of 792. Troponin 48. Chest x-ray with borderline cardiomegaly and interstitial edema as well as hazy by basilar airspace opacities concerning for pneumonia. Vascular surgery placed temporary cath at the bedside. Resp PCR neg. CT chest adenopathy, mild interstitial edema and mild effusion, will need outpatient f/u.  06/11: hemodialysis initiated today. Requiring HFNC O2 06/12: continue HD, plan permcath placement tomorrow.  06/13: weaning on O2 down to 5L/min. Permcath placement  06/14: confirmed outpatient HD setup for Tuesday - plan HD tomorrow, hopefully can reduce O2 requirement, she is still needing 5L/min. Will try working w/ PT/OT.   Consultants:  Vascular surgery Nephrology   Procedures: 06/10: R femoral temp cath placement for HD - removed 06/13 06/13: R IJ perm cath placement      ASSESSMENT & PLAN:   Principal Problem:   Acute respiratory failure with hypoxia (HCC) Active Problems:   Pneumonia   Acute renal failure superimposed on stage 4 chronic kidney disease (HCC)   Diabetes mellitus, type II (HCC)   Hypertension   CAD (coronary artery  disease), native coronary artery   Acquired hypothyroidism   ESRD (end stage renal disease) (HCC)   Hyperkalemia   Acute respiratory failure with hypoxia (HCC) d/t fluid overload in setting of renal failure, also complicated by potential pneumonia noted on CXR and supported by leukocytosis and elevated procalcitonin O2 support noninvasive, pt is DNR  Treat underlying causes as below  Follow respiratory status with diuresis, hemodialysis and pneumonia treatment  ESRD Hemodialysis initiated Renal navigator confirmed TTS outpatient dialysis    Questionable Community Acquired Pneumonia Urine Legionella, urine strep, BCx, Expanded respiratory panel negative for viral illness. CT chest to better correlate --> no lobular infiltrate   White count trending down - monitor CBC Procalcitonin 0.8 --> 3.52, will continue azithromycin to cover pneumonia x5 days  Sputum cultures pending   Acquired hypothyroidism Continue Synthroid   CAD (coronary artery disease), native coronary artery No active chest pain Troponin negative x 1 EKG stable Follow  Diabetes mellitus, type II (HCC) Monitor Glc   Hypertension BP elevated Amlodipine, clonidine restarted Continue Lasix  Hyperkalemia - improved  K 5.8 in setting of acute on chronic stage 4 CKD- requiring HD  EKG stable  Monitor closely  Abn CT chest  Mild mediastinal adenopathy, RIGHT hilar adenopathy and RIGHT lower lobe atelectasis. Small RIGHT effusion. Differential include RIGHT hilar malignancy versus benign effusion and atelectasis versus pneumonia. Mild interstitial edema pattern. Recommend clinical correlation and follow-up contrast CT in 4 to 6 weeks.   GERD w/ Hx GI bleed  Protonix   HLD Crestor   Anxiety Alprazolam prn   Hx CVA ASA, statin   Neuropathy and RLS Gabapentin  DVT prophylaxis: heparin  Pertinent IV fluids/nutrition: no continuous IV fluids  Central lines / invasive devices: R IJ Permcath    Code Status: DNR ACP documentation reviewed: 03/06/23 none on file w/ [facility]   Current Admission Status: inpatient   TOC needs / Dispo plan: recs for SNF  Barriers to discharge / significant pending items: O2 requirement, placement SNF              Subjective / Brief ROS:  Patient reports breathing better and better each day Denies CP Pain controlled.  Denies new weakness.  Reports no concerns w/ urination/defecation.   Family Communication: none at this time      Objective Findings:  Vitals:   03/08/23 1734 03/08/23 1951 03/09/23 0443 03/09/23 0727  BP: (!) 143/79 (!) 143/71 (!) 147/98 (!) 144/89  Pulse: (!) 107 (!) 105 86 90  Resp: 17 16  16   Temp: 98.2 F (36.8 C) 98.5 F (36.9 C) 98.3 F (36.8 C) 98 F (36.7 C)  TempSrc: Axillary Oral Oral Axillary  SpO2: 95% 97% 94% 95%  Weight:        Intake/Output Summary (Last 24 hours) at 03/09/2023 1301 Last data filed at 03/08/2023 2228 Gross per 24 hour  Intake 360 ml  Output 250 ml  Net 110 ml    Filed Weights   03/07/23 1016 03/07/23 1300 03/08/23 0817  Weight: 66.3 kg 66.3 kg 61 kg    Examination:  Physical Exam Constitutional:      General: She is not in acute distress.    Appearance: She is well-developed.  Cardiovascular:     Rate and Rhythm: Normal rate and regular rhythm.  Pulmonary:     Effort: Pulmonary effort is normal.     Breath sounds: Normal breath sounds.  Musculoskeletal:     Right lower leg: No edema.     Left lower leg: No edema.  Skin:    General: Skin is warm and dry.  Neurological:     General: No focal deficit present.     Mental Status: She is alert and oriented to person, place, and time.  Psychiatric:        Mood and Affect: Mood normal.        Behavior: Behavior normal.          Scheduled Medications:   amLODipine  10 mg Oral Daily   aspirin EC  81 mg Oral Daily   Chlorhexidine Gluconate Cloth  6 each Topical Q0600   cholecalciferol  1,000 Units Oral  Daily   cloNIDine  0.1 mg Oral BID   furosemide  80 mg Oral BID   gabapentin  100 mg Oral BID WC   gabapentin  200 mg Oral QHS   heparin  5,000 Units Subcutaneous Q8H   hydroxyurea  300 mg Oral Daily   latanoprost  1 drop Both Eyes QHS   levothyroxine  88 mcg Oral Daily   loratadine  10 mg Oral Daily   multivitamin  1 tablet Oral QHS   pantoprazole  40 mg Oral Daily   polyethylene glycol  17 g Oral Daily   rOPINIRole  0.5 mg Oral QHS   rosuvastatin  20 mg Oral Daily   timolol  1 drop Both Eyes q morning    Continuous Infusions:  anticoagulant sodium citrate     azithromycin 500 mg (03/08/23 1417)   cefTRIAXone (ROCEPHIN)  IV 2 g (03/08/23 1237)    PRN Medications:  acetaminophen, albuterol, ALPRAZolam, alteplase, anticoagulant sodium citrate,  heparin, lidocaine (PF), lidocaine-prilocaine, ondansetron **OR** ondansetron (ZOFRAN) IV, pentafluoroprop-tetrafluoroeth  Antimicrobials from admission:  Anti-infectives (From admission, onward)    Start     Dose/Rate Route Frequency Ordered Stop   03/08/23 0803  ceFAZolin (ANCEF) IVPB 1 g/50 mL premix  Status:  Discontinued        1 g 100 mL/hr over 30 Minutes Intravenous 30 min pre-op 03/08/23 0803 03/08/23 1706   03/06/23 1500  azithromycin (ZITHROMAX) 500 mg in sodium chloride 0.9 % 250 mL IVPB        500 mg 250 mL/hr over 60 Minutes Intravenous Every 24 hours 03/05/23 1516 03/11/23 1459   03/06/23 1200  cefTRIAXone (ROCEPHIN) 2 g in sodium chloride 0.9 % 100 mL IVPB        2 g 200 mL/hr over 30 Minutes Intravenous Every 24 hours 03/05/23 1516 03/10/23 1159   03/05/23 1345  cefTRIAXone (ROCEPHIN) 2 g in sodium chloride 0.9 % 100 mL IVPB        2 g 200 mL/hr over 30 Minutes Intravenous  Once 03/05/23 1330 03/05/23 1432   03/05/23 1345  azithromycin (ZITHROMAX) 500 mg in sodium chloride 0.9 % 250 mL IVPB        500 mg 250 mL/hr over 60 Minutes Intravenous  Once 03/05/23 1330 03/05/23 1703           Data Reviewed:  I have  personally reviewed the following...  CBC: Recent Labs  Lab 03/05/23 1146 03/06/23 0609 03/07/23 0505 03/08/23 0835  WBC 21.0* 19.5* 17.6* 14.6*  NEUTROABS 16.0*  --   --   --   HGB 12.3 11.7* 12.0 11.4*  HCT 40.1 38.8 39.0 37.3  MCV 85.0 85.5 82.5 83.4  PLT 690* 652* 595* 520*    Basic Metabolic Panel: Recent Labs  Lab 03/05/23 1146 03/06/23 0609 03/07/23 0505 03/08/23 0835  NA 137 139 138 139  K 5.8* 6.2* 4.2 3.9  CL 109 109 103 102  CO2 16* 18* 22 25  GLUCOSE 90 74 96 83  BUN 77* 81* 56* 38*  CREATININE 7.71* 8.52* 6.19* 5.05*  CALCIUM 7.7* 7.6* 7.0* 7.5*  PHOS  --   --   --  5.5*    GFR: CrCl cannot be calculated (Unknown ideal weight.). Liver Function Tests: Recent Labs  Lab 03/05/23 1146 03/06/23 0609 03/08/23 0835  AST 23 28  --   ALT 15 12  --   ALKPHOS 47 46  --   BILITOT 0.7 1.0  --   PROT 6.9 6.5  --   ALBUMIN 2.6* 2.5* 2.1*    No results for input(s): "LIPASE", "AMYLASE" in the last 168 hours. No results for input(s): "AMMONIA" in the last 168 hours. Coagulation Profile: No results for input(s): "INR", "PROTIME" in the last 168 hours. Cardiac Enzymes: No results for input(s): "CKTOTAL", "CKMB", "CKMBINDEX", "TROPONINI" in the last 168 hours. BNP (last 3 results) No results for input(s): "PROBNP" in the last 8760 hours. HbA1C: No results for input(s): "HGBA1C" in the last 72 hours. CBG: Recent Labs  Lab 03/06/23 1802 03/06/23 1857 03/06/23 2219  GLUCAP 67* 90 119*    Lipid Profile: No results for input(s): "CHOL", "HDL", "LDLCALC", "TRIG", "CHOLHDL", "LDLDIRECT" in the last 72 hours. Thyroid Function Tests: No results for input(s): "TSH", "T4TOTAL", "FREET4", "T3FREE", "THYROIDAB" in the last 72 hours. Anemia Panel: No results for input(s): "VITAMINB12", "FOLATE", "FERRITIN", "TIBC", "IRON", "RETICCTPCT" in the last 72 hours. Most Recent Urinalysis On File:     Component Value  Date/Time   COLORURINE STRAW (A) 12/12/2021 1702    APPEARANCEUR CLEAR (A) 12/12/2021 1702   LABSPEC 1.005 12/12/2021 1702   PHURINE 7.0 12/12/2021 1702   GLUCOSEU 50 (A) 12/12/2021 1702   HGBUR MODERATE (A) 12/12/2021 1702   BILIRUBINUR NEGATIVE 12/12/2021 1702   KETONESUR NEGATIVE 12/12/2021 1702   PROTEINUR 100 (A) 12/12/2021 1702   NITRITE NEGATIVE 12/12/2021 1702   LEUKOCYTESUR SMALL (A) 12/12/2021 1702   Sepsis Labs: @LABRCNTIP (procalcitonin:4,lacticidven:4) Microbiology: Recent Results (from the past 240 hour(s))  Blood culture (single)     Status: None (Preliminary result)   Collection Time: 03/05/23 11:45 AM   Specimen: BLOOD  Result Value Ref Range Status   Specimen Description BLOOD BLOOD LEFT HAND  Final   Special Requests   Final    BOTTLES DRAWN AEROBIC AND ANAEROBIC Blood Culture adequate volume   Culture   Final    NO GROWTH 4 DAYS Performed at [facility] [address] [place], [place] [address]    Report Status PENDING  Incomplete  SARS Coronavirus 2 by RT PCR (hospital order, performed in [facility] hospital lab) *cepheid single result test* Anterior Nasal Swab     Status: None   Collection Time: 03/05/23 12:02 PM   Specimen: Anterior Nasal Swab  Result Value Ref Range Status   SARS Coronavirus 2 by RT PCR NEGATIVE NEGATIVE Final    Comment: (NOTE) SARS-CoV-2 target nucleic acids are NOT DETECTED.  The SARS-CoV-2 RNA is generally detectable in upper and lower respiratory specimens during the acute phase of infection. The lowest concentration of SARS-CoV-2 viral copies this assay can detect is 250 copies / mL. A negative result does not preclude SARS-CoV-2 infection and should not be used as the sole basis for treatment or other patient management decisions.  A negative result may occur with improper specimen collection / handling, submission of specimen other than nasopharyngeal swab, presence of viral mutation(s) within the areas targeted by this assay, and inadequate number of viral  copies (<250 copies / mL). A negative result must be combined with clinical observations, patient history, and epidemiological information.  Fact Sheet for Patients:   RoadLapTop.co.za  Fact Sheet for Healthcare Providers: http://kim-miller.com/  This test is not yet approved or  cleared by the [place] FDA and has been authorized for detection and/or diagnosis of SARS-CoV-2 by FDA under an Emergency Use Authorization (EUA).  This EUA will remain in effect (meaning this test can be used) for the duration of the COVID-19 declaration under Section 564(b)(1) of the Act, 21 U.S.C. section 360bbb-3(b)(1), unless the authorization is terminated or revoked sooner.  Performed at [facility], [address] [place], [place] [address]   Respiratory (~20 pathogens) panel by PCR     Status: None   Collection Time: 03/05/23  4:00 PM   Specimen: Nasopharyngeal Swab; Respiratory  Result Value Ref Range Status   Adenovirus NOT DETECTED NOT DETECTED Final   Coronavirus 229E NOT DETECTED NOT DETECTED Final    Comment: (NOTE) The Coronavirus on the Respiratory Panel, DOES NOT test for the novel  Coronavirus (2019 nCoV)    Coronavirus HKU1 NOT DETECTED NOT DETECTED Final   Coronavirus NL63 NOT DETECTED NOT DETECTED Final   Coronavirus OC43 NOT DETECTED NOT DETECTED Final   Metapneumovirus NOT DETECTED NOT DETECTED Final   Rhinovirus / Enterovirus NOT DETECTED NOT DETECTED Final   Influenza A NOT DETECTED NOT DETECTED Final   Influenza B NOT DETECTED NOT DETECTED Final   Parainfluenza  Virus 1 NOT DETECTED NOT DETECTED Final   Parainfluenza Virus 2 NOT DETECTED NOT DETECTED Final   Parainfluenza Virus 3 NOT DETECTED NOT DETECTED Final   Parainfluenza Virus 4 NOT DETECTED NOT DETECTED Final   Respiratory Syncytial Virus NOT DETECTED NOT DETECTED Final   Bordetella pertussis NOT DETECTED NOT DETECTED Final   Bordetella Parapertussis NOT  DETECTED NOT DETECTED Final   Chlamydophila pneumoniae NOT DETECTED NOT DETECTED Final   Mycoplasma pneumoniae NOT DETECTED NOT DETECTED Final    Comment: Performed at [facility], [address], [place], [place] [address]      Radiology Studies last 3 days: PERIPHERAL VASCULAR CATHETERIZATION  Result Date: 03/08/2023 See surgical note for result.  ECHOCARDIOGRAM COMPLETE  Result Date: 03/07/2023    ECHOCARDIOGRAM REPORT   Patient Name:   ANNA-MARIE Kovacich Date of Exam: 03/06/2023 Medical Rec #:  [id number]       Height:       60.0 in Accession #:    [id number]      Weight:       153.1 lb Date of Birth:  [birth year]      BSA:          1.666 m Patient Age:    89 years        BP:           150/67 mmHg Patient Gender: F               HR:           85 bpm. Exam Location:  [facility] Procedure: 2D Echo, Cardiac Doppler and Color Doppler Indications:     Other cardiac sounds  History:         Patient has prior history of Echocardiogram examinations, most                  recent 10/14/2021. CAD, Stroke; Risk Factors:Hypertension,                  Diabetes and Dyslipidemia. ESRD.  Sonographer:     Mikki Harbor Referring Phys:  [phone] Francoise Schaumann [place] Diagnosing Phys: Marcina Millard MD IMPRESSIONS  1. Left ventricular ejection fraction, by estimation, is 60 to 65%. The left ventricle has normal function. The left ventricle has no regional wall motion abnormalities. Left ventricular diastolic parameters are consistent with Grade I diastolic dysfunction (impaired relaxation).  2. Right ventricular systolic function is normal. The right ventricular size is normal. There is normal pulmonary artery systolic pressure.  3. The mitral valve is normal in structure. Mild to moderate mitral valve regurgitation. No evidence of mitral stenosis.  4. The aortic valve is normal in structure. Aortic valve regurgitation is mild to moderate. No aortic stenosis is present.  5. The inferior vena cava is normal in size with  greater than 50% respiratory variability, suggesting right atrial pressure of 3 mmHg. FINDINGS  Left Ventricle: Left ventricular ejection fraction, by estimation, is 60 to 65%. The left ventricle has normal function. The left ventricle has no regional wall motion abnormalities. The left ventricular internal cavity size was normal in size. There is  no left ventricular hypertrophy. Left ventricular diastolic parameters are consistent with Grade I diastolic dysfunction (impaired relaxation). Right Ventricle: The right ventricular size is normal. No increase in right ventricular wall thickness. Right ventricular systolic function is normal. There is normal pulmonary artery systolic pressure. The tricuspid regurgitant velocity is 2.37 m/s, and  with an assumed right atrial pressure of 3 mmHg,  the estimated right ventricular systolic pressure is 25.5 mmHg. Left Atrium: Left atrial size was normal in size. Right Atrium: Right atrial size was normal in size. Pericardium: There is no evidence of pericardial effusion. Mitral Valve: The mitral valve is normal in structure. Mild to moderate mitral valve regurgitation. No evidence of mitral valve stenosis. MV peak gradient, 9.6 mmHg. The mean mitral valve gradient is 3.0 mmHg. Tricuspid Valve: The tricuspid valve is normal in structure. Tricuspid valve regurgitation is not demonstrated. No evidence of tricuspid stenosis. Aortic Valve: The aortic valve is normal in structure. Aortic valve regurgitation is mild to moderate. No aortic stenosis is present. Aortic valve mean gradient measures 7.5 mmHg. Aortic valve peak gradient measures 13.5 mmHg. Aortic valve area, by VTI measures 2.16 cm. Pulmonic Valve: The pulmonic valve was normal in structure. Pulmonic valve regurgitation is not visualized. No evidence of pulmonic stenosis. Aorta: The aortic root is normal in size and structure. Venous: The inferior vena cava is normal in size with greater than 50% respiratory variability,  suggesting right atrial pressure of 3 mmHg. IAS/Shunts: No atrial level shunt detected by color flow Doppler.  LEFT VENTRICLE PLAX 2D LVIDd:         4.60 cm   Diastology LVIDs:         2.90 cm   LV e' medial:    6.53 cm/s LV PW:         1.40 cm   LV E/e' medial:  12.0 LV IVS:        1.20 cm   LV e' lateral:   9.03 cm/s LVOT diam:     2.00 cm   LV E/e' lateral: 8.7 LV SV:         86 LV SV Index:   52 LVOT Area:     3.14 cm  RIGHT VENTRICLE RV Basal diam:  2.75 cm RV Mid diam:    2.70 cm RV S prime:     16.00 cm/s LEFT ATRIUM             Index        RIGHT ATRIUM           Index LA diam:        3.80 cm 2.28 cm/m   RA Area:     13.60 cm LA Vol (A2C):   71.0 ml 42.61 ml/m  RA Volume:   29.80 ml  17.88 ml/m LA Vol (A4C):   42.5 ml 25.51 ml/m LA Biplane Vol: 58.3 ml 34.99 ml/m  AORTIC VALVE                     PULMONIC VALVE AV Area (Vmax):    2.27 cm      PV Vmax:       0.94 m/s AV Area (Vmean):   2.19 cm      PV Peak grad:  3.5 mmHg AV Area (VTI):     2.16 cm AV Vmax:           184.00 cm/s AV Vmean:          124.500 cm/s AV VTI:            0.400 m AV Peak Grad:      13.5 mmHg AV Mean Grad:      7.5 mmHg LVOT Vmax:         133.00 cm/s LVOT Vmean:        86.800 cm/s LVOT VTI:  0.275 m LVOT/AV VTI ratio: 0.69  AORTA Ao Root diam: 2.90 cm MITRAL VALVE                TRICUSPID VALVE MV Area (PHT): 3.46 cm     TR Peak grad:   22.5 mmHg MV Area VTI:   2.57 cm     TR Vmax:        237.00 cm/s MV Peak grad:  9.6 mmHg MV Mean grad:  3.0 mmHg     SHUNTS MV Vmax:       1.55 m/s     Systemic VTI:  0.28 m MV Vmean:      83.4 cm/s    Systemic Diam: 2.00 cm MV Decel Time: 219 msec MV E velocity: 78.60 cm/s MV A velocity: 148.00 cm/s MV E/A ratio:  0.53 Marcina Millard MD Electronically signed by Marcina Millard MD Signature Date/Time: 03/07/2023/4:39:51 PM    Final    CT CHEST WO CONTRAST  Result Date: 03/05/2023 CLINICAL DATA:  Respiratory illness. EXAM: CT CHEST WITHOUT CONTRAST TECHNIQUE: Multidetector  CT imaging of the chest was performed following the standard protocol without IV contrast. RADIATION DOSE REDUCTION: This exam was performed according to the departmental dose-optimization program which includes automated exposure control, adjustment of the mA and/or kV according to patient size and/or use of iterative reconstruction technique. COMPARISON:  CT abdomen pelvis 12/11/2021 FINDINGS: Cardiovascular: Coronary artery calcification and aortic atherosclerotic calcification. Mediastinum/Nodes: RIGHT lower paratracheal node measures 11 mm (50/2). There is mild hilar adenopathy surrounding the distal RIGHT bronchus intermedius (image 70/2). No clear obstructing lesion identified. Lungs/Pleura: Moderate RIGHT effusion with RIGHT basilar atelectasis. Small LEFT effusion. Mild interstitial edema pattern with interstitial thickening in LEFT or RIGHT lung. Upper Abdomen: Limited view of the liver, kidneys, pancreas are unremarkable. Normal adrenal glands. Musculoskeletal: No aggressive osseous lesion. IMPRESSION: Mild mediastinal adenopathy, RIGHT hilar adenopathy and RIGHT lower lobe atelectasis. Small RIGHT effusion. Differential include RIGHT hilar malignancy versus benign effusion and atelectasis versus pneumonia. Mild interstitial edema pattern. Recommend clinical correlation and follow-up contrast CT in 4 to 6 weeks. Electronically Signed   By: Genevive Bi M.D.   On: 03/05/2023 17:09   PERIPHERAL VASCULAR CATHETERIZATION  Result Date: 03/05/2023 See surgical note for result.            LOS: 4 days    Time spent: 50 min    [name], DO [facility] 03/09/2023, 1:01 PM    Dictation software may have been used to generate the above note. Typos may occur and escape review in typed/dictated notes. Please contact Dr Lyn Hollingshead directly for clarity if needed.  Staff may message me via secure chat in Epic  but this may not receive an immediate response,  please page me  for urgent matters!  If 7PM-7AM, please contact night coverage [email or web address]

## 2023-03-09 NOTE — Discharge Planning (Signed)
PLACEMENT RESOLVED: Outpatient facility [facility]  [address], [place] [address] [phone]  Schedule: Tuesday Thursday and Saturday Time: 7:30am Start: 6/18 @ 7:00am

## 2023-03-09 NOTE — Progress Notes (Signed)
  Progress Note    03/09/2023 4:10 PM 1 Day Post-Op  Subjective:  This is a 89 y.o. female with an extensive medical history who had a temporary dialysis catheter placed last week and is now POD #1 from Dialysis Perma Catheter placement. She is resting comfortably in bed. She endorses soreness around the site but no other complaints. Vitals all remain stable.      Vitals:   03/09/23 0443 03/09/23 0727  BP: (!) 147/98 (!) 144/89  Pulse: 86 90  Resp:  16  Temp: 98.3 F (36.8 C) 98 F (36.7 C)  SpO2: 94% 95%   Physical Exam: Cardiac:  RRR, normal S1, S2, no rubs clicks or gallops or murmurs. Lungs: On auscultation patient has bilateral diffuse rales with some rhonchi.  She remains on high flow nasal cannula oxygen. Incisions: Right groin incision with temporary dialysis catheter.  No complications to note. Extremities: Bilateral lower extremity +2 edema.  Unable to palpate pulses due to edema. Abdomen: Positive bowel sounds throughout, soft, nontender and nondistended. Neurologic: Alert and oriented x 3 and follows commands.  Answers all questions appropriately.  CBC    Component Value Date/Time   WBC 14.6 (H) 03/08/2023 0835   RBC 4.47 03/08/2023 0835   HGB 11.4 (L) 03/08/2023 0835   HGB 11.9 (L) 12/07/2012 1641   HCT 37.3 03/08/2023 0835   HCT 37.2 12/07/2012 1641   PLT 520 (H) 03/08/2023 0835   PLT 216 12/07/2012 1641   MCV 83.4 03/08/2023 0835   MCV 88 12/07/2012 1641   MCH 25.5 (L) 03/08/2023 0835   MCHC 30.6 03/08/2023 0835   RDW 17.8 (H) 03/08/2023 0835   RDW 15.7 (H) 12/07/2012 1641   LYMPHSABS 1.4 03/05/2023 1146   LYMPHSABS 1.2 12/07/2012 1641   MONOABS 2.9 (H) 03/05/2023 1146   MONOABS 1.1 (H) 12/07/2012 1641   EOSABS 0.1 03/05/2023 1146   EOSABS 0.2 12/07/2012 1641   BASOSABS 0.1 03/05/2023 1146   BASOSABS 0.1 12/07/2012 1641    BMET    Component Value Date/Time   NA 139 03/08/2023 0835   NA 137 12/07/2012 1641   K 3.9 03/08/2023 0835   K 4.2  12/07/2012 1641   CL 102 03/08/2023 0835   CL 106 12/07/2012 1641   CO2 25 03/08/2023 0835   CO2 27 12/07/2012 1641   GLUCOSE 83 03/08/2023 0835   GLUCOSE 75 12/07/2012 1641   BUN 38 (H) 03/08/2023 0835   BUN 18 12/07/2012 1641   CREATININE 5.05 (H) 03/08/2023 0835   CREATININE 1.13 12/07/2012 1641   CALCIUM 7.5 (L) 03/08/2023 0835   CALCIUM 8.9 12/07/2012 1641   GFRNONAA 8 (L) 03/08/2023 0835   GFRNONAA 45 (L) 12/07/2012 1641   GFRAA 25 (L) 03/30/2020 1026   GFRAA 52 (L) 12/07/2012 1641    INR    Component Value Date/Time   INR 1.1 12/10/2021 2213     Intake/Output Summary (Last 24 hours) at 03/09/2023 1610 Last data filed at 03/08/2023 2228 Gross per 24 hour  Intake 360 ml  Output 250 ml  Net 110 ml     Assessment/Plan:  89 y.o. female is s/p Dialysis Perma Catheter Placement 1 Day Post-Op   PLAN: Dialysis catheter is working well. Vascular surgery will sign off. If needed in the future please reconsult.       Marcie [facility] 03/09/2023 4:10 PM

## 2023-03-09 NOTE — Progress Notes (Signed)
[facility] [place], [place] 03/09/23  Subjective:   Hospital day # 4 Patient known to our practice from outpatient follow-up for CKD.   Patient seen resting in bed Alert and oriented No family present States she feels well today, request discharge plan Tolerating meals Remains on high flow nasal cannula Preparing to work with therapy  Renal: 06/13 0701 - 06/14 0700 In: 360 [P.O.:360] Out: 1550 [Urine:250] Lab Results  Component Value Date   CREATININE 5.05 (H) 03/08/2023   CREATININE 6.19 (H) 03/07/2023   CREATININE 8.52 (H) 03/06/2023     Objective:  Vital signs in last 24 hours:  Temp:  [98 F (36.7 C)-99.2 F (37.3 C)] 98 F (36.7 C) (06/14 0727) Pulse Rate:  [86-121] 90 (06/14 0727) Resp:  [16-26] 16 (06/14 0727) BP: (137-165)/(71-98) 144/89 (06/14 0727) SpO2:  [90 %-98 %] 95 % (06/14 0727)  Weight change: -5.3 kg Filed Weights   03/07/23 1016 03/07/23 1300 03/08/23 0817  Weight: 66.3 kg 66.3 kg 61 kg    Intake/Output:    Intake/Output Summary (Last 24 hours) at 03/09/2023 1312 Last data filed at 03/08/2023 2228 Gross per 24 hour  Intake 360 ml  Output 250 ml  Net 110 ml      Physical Exam: General: NAD  HEENT Normocephalic   Pulm/lungs Diminished in bases., HFNC  CVS/Heart regular rate and rhythm   Abdomen:  Soft, nontender, nondistended  Extremities: Dependent edema present  Neurologic: Alert, able to answer questions  Skin: No acute rashes  Access: Rt chest PermCath       Basic Metabolic Panel:  Recent Labs  Lab 03/05/23 1146 03/06/23 0609 03/07/23 0505 03/08/23 0835  NA 137 139 138 139  K 5.8* 6.2* 4.2 3.9  CL 109 109 103 102  CO2 16* 18* 22 25  GLUCOSE 90 74 96 83  BUN 77* 81* 56* 38*  CREATININE 7.71* 8.52* 6.19* 5.05*  CALCIUM 7.7* 7.6* 7.0* 7.5*  PHOS  --   --   --  5.5*      CBC: Recent Labs  Lab 03/05/23 1146 03/06/23 0609 03/07/23 0505 03/08/23 0835  WBC 21.0* 19.5* 17.6* 14.6*   NEUTROABS 16.0*  --   --   --   HGB 12.3 11.7* 12.0 11.4*  HCT 40.1 38.8 39.0 37.3  MCV 85.0 85.5 82.5 83.4  PLT 690* 652* 595* 520*       Lab Results  Component Value Date   HEPBSAG NON REACTIVE 03/05/2023      Microbiology:  Recent Results (from the past 240 hour(s))  Blood culture (single)     Status: None (Preliminary result)   Collection Time: 03/05/23 11:45 AM   Specimen: BLOOD  Result Value Ref Range Status   Specimen Description BLOOD BLOOD LEFT HAND  Final   Special Requests   Final    BOTTLES DRAWN AEROBIC AND ANAEROBIC Blood Culture adequate volume   Culture   Final    NO GROWTH 4 DAYS Performed at [facility], [address], [place], [place] [address]    Report Status PENDING  Incomplete  SARS Coronavirus 2 by RT PCR (hospital order, performed in [facility] hospital lab) *cepheid single result test* Anterior Nasal Swab     Status: None   Collection Time: 03/05/23 12:02 PM   Specimen: Anterior Nasal Swab  Result Value Ref Range Status   SARS Coronavirus 2 by RT PCR NEGATIVE NEGATIVE Final    Comment: (NOTE) SARS-CoV-2 target nucleic acids are NOT  DETECTED.  The SARS-CoV-2 RNA is generally detectable in upper and lower respiratory specimens during the acute phase of infection. The lowest concentration of SARS-CoV-2 viral copies this assay can detect is 250 copies / mL. A negative result does not preclude SARS-CoV-2 infection and should not be used as the sole basis for treatment or other patient management decisions.  A negative result may occur with improper specimen collection / handling, submission of specimen other than nasopharyngeal swab, presence of viral mutation(s) within the areas targeted by this assay, and inadequate number of viral copies (<250 copies / mL). A negative result must be combined with clinical observations, patient history, and epidemiological information.  Fact Sheet for Patients:    RoadLapTop.co.za  Fact Sheet for Healthcare Providers: http://kim-miller.com/  This test is not yet approved or  cleared by the [place] FDA and has been authorized for detection and/or diagnosis of SARS-CoV-2 by FDA under an Emergency Use Authorization (EUA).  This EUA will remain in effect (meaning this test can be used) for the duration of the COVID-19 declaration under Section 564(b)(1) of the Act, 21 U.S.C. section 360bbb-3(b)(1), unless the authorization is terminated or revoked sooner.  Performed at [facility], [address] [place], [place] [address]   Respiratory (~20 pathogens) panel by PCR     Status: None   Collection Time: 03/05/23  4:00 PM   Specimen: Nasopharyngeal Swab; Respiratory  Result Value Ref Range Status   Adenovirus NOT DETECTED NOT DETECTED Final   Coronavirus 229E NOT DETECTED NOT DETECTED Final    Comment: (NOTE) The Coronavirus on the Respiratory Panel, DOES NOT test for the novel  Coronavirus (2019 nCoV)    Coronavirus HKU1 NOT DETECTED NOT DETECTED Final   Coronavirus NL63 NOT DETECTED NOT DETECTED Final   Coronavirus OC43 NOT DETECTED NOT DETECTED Final   Metapneumovirus NOT DETECTED NOT DETECTED Final   Rhinovirus / Enterovirus NOT DETECTED NOT DETECTED Final   Influenza A NOT DETECTED NOT DETECTED Final   Influenza B NOT DETECTED NOT DETECTED Final   Parainfluenza Virus 1 NOT DETECTED NOT DETECTED Final   Parainfluenza Virus 2 NOT DETECTED NOT DETECTED Final   Parainfluenza Virus 3 NOT DETECTED NOT DETECTED Final   Parainfluenza Virus 4 NOT DETECTED NOT DETECTED Final   Respiratory Syncytial Virus NOT DETECTED NOT DETECTED Final   Bordetella pertussis NOT DETECTED NOT DETECTED Final   Bordetella Parapertussis NOT DETECTED NOT DETECTED Final   Chlamydophila pneumoniae NOT DETECTED NOT DETECTED Final   Mycoplasma pneumoniae NOT DETECTED NOT DETECTED Final    Comment: Performed at  [facility], [address], [place], [place] [address]    Coagulation Studies: No results for input(s): "LABPROT", "INR" in the last 72 hours.  Urinalysis: No results for input(s): "COLORURINE", "LABSPEC", "PHURINE", "GLUCOSEU", "HGBUR", "BILIRUBINUR", "KETONESUR", "PROTEINUR", "UROBILINOGEN", "NITRITE", "LEUKOCYTESUR" in the last 72 hours.  Invalid input(s): "APPERANCEUR"    Imaging: PERIPHERAL VASCULAR CATHETERIZATION  Result Date: 03/08/2023 See surgical note for result.    Medications:    anticoagulant sodium citrate     azithromycin 500 mg (03/08/23 1417)   cefTRIAXone (ROCEPHIN)  IV 2 g (03/09/23 1303)    amLODipine  10 mg Oral Daily   aspirin EC  81 mg Oral Daily   Chlorhexidine Gluconate Cloth  6 each Topical Q0600   cholecalciferol  1,000 Units Oral Daily   cloNIDine  0.1 mg Oral BID   furosemide  80 mg Oral BID   gabapentin  100 mg Oral  BID WC   gabapentin  200 mg Oral QHS   heparin  5,000 Units Subcutaneous Q8H   hydroxyurea  300 mg Oral Daily   latanoprost  1 drop Both Eyes QHS   levothyroxine  88 mcg Oral Daily   loratadine  10 mg Oral Daily   multivitamin  1 tablet Oral QHS   pantoprazole  40 mg Oral Daily   polyethylene glycol  17 g Oral Daily   rOPINIRole  0.5 mg Oral QHS   rosuvastatin  20 mg Oral Daily   timolol  1 drop Both Eyes q morning   acetaminophen, albuterol, ALPRAZolam, alteplase, anticoagulant sodium citrate, heparin, lidocaine (PF), lidocaine-prilocaine, ondansetron **OR** ondansetron (ZOFRAN) IV, pentafluoroprop-tetrafluoroeth  Assessment/ Plan:  89 y.o. female with coronary artery disease, diabetes type 2, chronic kidney disease, hyperlipidemia, hypertension, hypothyroidism    admitted on 03/05/2023 for Acute pulmonary edema (HCC) [J81.0] Renal failure [N19] ESRD (end stage renal disease) (HCC) [N18.6] Acute respiratory failure with hypoxia (HCC) [J96.01] Community acquired pneumonia, unspecified laterality [J18.9]  End-stage  renal disease Patient has advanced chronic kidney disease.  Her baseline creatinine is 5.96/GFR 6 from April 2024. She now comes in with volume overload, hyperkalemia and metabolic acidosis.    Patient received third dialysis treatment yesterday, tolerated treatment well.  Next treatment scheduled for Saturday, preferably with patient seated in chair  Appreciate vascular surgery placing right chest PermCath and nursing has removed the right groin temp cath.  Renal navigator has confirmed outpatient dialysis clinic at [facility] on a TTS schedule, patient cleared to begin outpatient treatments on Tuesday.  Acute metabolic acidosis Corrected  Volume overload/acute respiratory failure BiPAP required on admission.  Currently on 5L HFNC Continue IV furosemide 80mg  BID Continue azithromycin and Rocephin for treatment of pneumonia..  Hyperkalemia Corrected    LOS: 4 [facility] 6/14/20241:12 PM  [facility] [place], [place] [phone]

## 2023-03-10 DIAGNOSIS — J9601 Acute respiratory failure with hypoxia: Secondary | ICD-10-CM | POA: Diagnosis not present

## 2023-03-10 LAB — RENAL FUNCTION PANEL
Albumin: 2.2 g/dL — ABNORMAL LOW (ref 3.5–5.0)
Anion gap: 13 (ref 5–15)
BUN: 36 mg/dL — ABNORMAL HIGH (ref 8–23)
CO2: 25 mmol/L (ref 22–32)
Calcium: 7.3 mg/dL — ABNORMAL LOW (ref 8.9–10.3)
Chloride: 93 mmol/L — ABNORMAL LOW (ref 98–111)
Creatinine, Ser: 5.48 mg/dL — ABNORMAL HIGH (ref 0.44–1.00)
GFR, Estimated: 7 mL/min — ABNORMAL LOW (ref >60)
Glucose, Bld: 147 mg/dL — ABNORMAL HIGH (ref 70–99)
Phosphorus: 5.7 mg/dL — ABNORMAL HIGH (ref 2.5–4.6)
Potassium: 3.5 mmol/L (ref 3.5–5.1)
Sodium: 131 mmol/L — ABNORMAL LOW (ref 135–145)

## 2023-03-10 LAB — CBC
HCT: 36.5 % (ref 36.0–46.0)
Hemoglobin: 10.9 g/dL — ABNORMAL LOW (ref 12.0–15.0)
MCH: 25.6 pg — ABNORMAL LOW (ref 26.0–34.0)
MCHC: 29.9 g/dL — ABNORMAL LOW (ref 30.0–36.0)
MCV: 85.7 fL (ref 80.0–100.0)
Platelets: 420 10*3/uL — ABNORMAL HIGH (ref 150–400)
RBC: 4.26 MIL/uL (ref 3.87–5.11)
RDW: 17.7 % — ABNORMAL HIGH (ref 11.5–15.5)
WBC: 13.6 10*3/uL — ABNORMAL HIGH (ref 4.0–10.5)
nRBC: 0 % (ref 0.0–0.2)

## 2023-03-10 LAB — CULTURE, BLOOD (SINGLE): Culture: NO GROWTH

## 2023-03-10 MED ORDER — METOPROLOL TARTRATE 25 MG PO TABS
12.5000 mg | ORAL_TABLET | Freq: Two times a day (BID) | ORAL | Status: DC
  Administered 2023-03-10 – 2023-03-13 (×6): 12.5 mg via ORAL
  Filled 2023-03-10 (×6): qty 1

## 2023-03-10 NOTE — Progress Notes (Signed)
Physical Therapy Treatment Patient Details Name: Cassandra [name] MRN: [id number] DOB: Dec 02, 1926 Today's Date: 03/10/2023   History of Present Illness Pt is a 89 y.o. female presenting to hospital 03/05/23 with c/o SOB for about the past week; has been coughing and weak.  Pt admitted with acute respiratory failure with hypoxia, PNA, acute renal failure superimposed on stage 4 CKD, acquired hypothyroidism, and hyperkalemia.  R femoral temp cath placement for HD 6/10.  R IJ permcath placed 03/08/23.  PMH includes CAD, DM, stage IV CKD, HLD, htn, hypothyroidism, foot sx, L knee arthroscopy with meniscectomy.    PT Comments    Pt progressing with activity tolerance ambulating from bed around to recliner with RW, Min A, and on RA (Sp02 >93%) put pt back on 2L02 per RN request after PT session.  Pt performed bed mobility with supervision and transfers with Min A, pt had some posterior lean with initial sit>stand but decreased lean once pt starting ambulating forward.  Pt would currently benefit from skilled PT to address noted impairments and functional limitations (see below for any additional details).  Upon hospital discharge, pt would benefit from ongoing therapy; pt reported to this author changing request to STR at discharge.      Recommendations for follow up therapy are one component of a multi-disciplinary discharge planning process, led by the attending physician.  Recommendations may be updated based on patient status, additional functional criteria and insurance authorization.  Follow Up Recommendations  Can patient physically be transported by private vehicle: Yes    Assistance Recommended at Discharge Frequent or constant Supervision/Assistance  Patient can return home with the following A lot of help with walking and/or transfers;A little help with bathing/dressing/bathroom;Assistance with cooking/housework;Assist for transportation;Help with stairs or ramp for entrance   Equipment  Recommendations  Rolling walker (2 wheels)    Recommendations for Other Services OT consult     Precautions / Restrictions Precautions Precautions: Fall Restrictions Weight Bearing Restrictions: No     Mobility  Bed Mobility Overal bed mobility: Needs Assistance Bed Mobility: Sit to Supine     Supine to sit: Supervision     General bed mobility comments: pt used bedrail lightly.    Transfers Overall transfer level: Needs assistance Equipment used: Rolling walker (2 wheels) Transfers: Sit to/from Stand, Bed to chair/wheelchair/BSC Sit to Stand: Min assist   Step pivot transfers: Min guard       General transfer comment: vc's for UE/LE placement and cues for weightshifting foward vs. posterior lean; assist to stand and control descent sitting    Ambulation/Gait Ambulation/Gait assistance: Min assist Gait Distance (Feet): 15 Feet Assistive device: Rolling walker (2 wheels) Gait Pattern/deviations: Step-through pattern, Shuffle, Trunk flexed Gait velocity: decreased     General Gait Details: assist/cueing for balance d/t posterior lean   Stairs             Wheelchair Mobility    Modified Rankin (Stroke Patients Only)       Balance Overall balance assessment: Needs assistance Sitting-balance support: Feet supported, No upper extremity supported Sitting balance-Leahy Scale: Good Sitting balance - Comments: steady reaching within BOS   Standing balance support: Bilateral upper extremity supported, Reliant on assistive device for balance Standing balance-Leahy Scale: Fair Standing balance comment: posterior lean noted in standing activities requiring assist and cueing to correct. Pt able to correct posterior lean when starting to ambulate.  Cognition Arousal/Alertness: Awake/alert Behavior During Therapy: WFL for tasks assessed/performed Overall Cognitive Status: Within Functional Limits for tasks assessed                                  General Comments: A&Ox4; decreased awareness of safety and impairments with mobility        Exercises Total Joint Exercises Ankle Circles/Pumps: AROM, Strengthening, Both, 10 reps Quad Sets: AROM, Strengthening, Both, 10 reps Gluteal Sets: AROM, Strengthening, Both, 10 reps    General Comments        Pertinent Vitals/Pain Pain Assessment Pain Assessment: Faces Faces Pain Scale: Hurts a little bit Pain Location: R IJ permcath site Pain Descriptors / Indicators: Tender, Sore Pain Intervention(s): Monitored during session    Home Living                          Prior Function            PT Goals (current goals can now be found in the care plan section) Acute Rehab PT Goals Patient Stated Goal: to improve strength and mobility PT Goal Formulation: With patient Time For Goal Achievement: 03/23/23 Potential to Achieve Goals: Good Progress towards PT goals: Progressing toward goals    Frequency    Min 4X/week      PT Plan      Co-evaluation              AM-PAC PT "6 Clicks" Mobility   Outcome Measure  Help needed turning from your back to your side while in a flat bed without using bedrails?: None Help needed moving from lying on your back to sitting on the side of a flat bed without using bedrails?: A Little Help needed moving to and from a bed to a chair (including a wheelchair)?: A Little Help needed standing up from a chair using your arms (e.g., wheelchair or bedside chair)?: A Little   Help needed climbing 3-5 steps with a railing? : A Lot 6 Click Score: 15    End of Session Equipment Utilized During Treatment: Gait belt Activity Tolerance: Patient tolerated treatment well Patient left: with call bell/phone within reach;in chair;with chair alarm set Nurse Communication: Mobility status;Precautions;Other (comment) (Pt tolerated activity on RA) PT Visit Diagnosis: Unsteadiness on feet  (R26.81);Other abnormalities of gait and mobility (R26.89);Muscle weakness (generalized) (M62.81);Pain Pain - Right/Left:  (R IJ permcath site)     Time:  -     Charges:  $Therapeutic Exercise: 8-22 mins $Therapeutic Activity: 8-22 mins                     Hortencia Conradi, PTA  03/10/23, 9:40 AM

## 2023-03-10 NOTE — Progress Notes (Signed)
[facility]  Dialysis Note   Subjective:   Seen and examined on hemodialysis treatment.  Stable dialysis.   HEMODIALYSIS FLOWSHEET:  Blood Flow Rate (mL/min): 400 mL/min Arterial Pressure (mmHg): -260 mmHg Venous Pressure (mmHg): 160 mmHg TMP (mmHg): -17 mmHg Ultrafiltration Rate (mL/min): 600 mL/min Dialysate Flow Rate (mL/min): 300 ml/min Dialysis Fluid Bolus: Normal Saline    Objective:  Vital signs in last 24 hours:  Temp:  [97.6 F (36.4 C)-98.5 F (36.9 C)] 97.7 F (36.5 C) (06/15 1240) Pulse Rate:  [88-106] 93 (06/15 1606) Resp:  [14-25] 20 (06/15 1606) BP: (108-162)/(61-99) 141/87 (06/15 1606) SpO2:  [92 %-100 %] 96 % (06/15 1606) Weight:  [61 kg] 61 kg (06/15 1240)  Weight change:  Filed Weights   03/07/23 1300 03/08/23 0817 03/10/23 1240  Weight: 66.3 kg 61 kg 61 kg    Intake/Output: I/O last 3 completed shifts: In: 979.6 [P.O.:600; IV Piggyback:379.6] Out: 1250 [Urine:1250]   Intake/Output this shift:  No intake/output data recorded.  Physical Exam: General: NAD,   Head: Normocephalic, atraumatic. Moist oral mucosal membranes  Eyes: Anicteric, PERRL  Neck: Supple, trachea midline  Lungs:  Clear to auscultation  Heart: Regular rate and rhythm  Abdomen:  Soft, nontender,   Extremities:  peripheral edema.  Neurologic: Nonfocal, moving all four extremities  Skin: No lesions  Access:     Basic Metabolic Panel: Recent Labs  Lab 03/05/23 1146 03/06/23 0609 03/07/23 0505 03/08/23 0835 03/10/23 1250  NA 137 139 138 139 131*  K 5.8* 6.2* 4.2 3.9 3.5  CL 109 109 103 102 93*  CO2 16* 18* 22 25 25   GLUCOSE 90 74 96 83 147*  BUN 77* 81* 56* 38* 36*  CREATININE 7.71* 8.52* 6.19* 5.05* 5.48*  CALCIUM 7.7* 7.6* 7.0* 7.5* 7.3*  PHOS  --   --   --  5.5* 5.7*    Liver Function Tests: Recent Labs  Lab 03/05/23 1146 03/06/23 0609 03/08/23 0835 03/10/23 1250  AST 23 28  --   --   ALT 15 12  --   --   ALKPHOS 47 46  --   --    BILITOT 0.7 1.0  --   --   PROT 6.9 6.5  --   --   ALBUMIN 2.6* 2.5* 2.1* 2.2*   No results for input(s): "LIPASE", "AMYLASE" in the last 168 hours. No results for input(s): "AMMONIA" in the last 168 hours.  CBC: Recent Labs  Lab 03/05/23 1146 03/06/23 0609 03/07/23 0505 03/08/23 0835 03/10/23 1250  WBC 21.0* 19.5* 17.6* 14.6* 13.6*  NEUTROABS 16.0*  --   --   --   --   HGB 12.3 11.7* 12.0 11.4* 10.9*  HCT 40.1 38.8 39.0 37.3 36.5  MCV 85.0 85.5 82.5 83.4 85.7  PLT 690* 652* 595* 520* 420*    Cardiac Enzymes: No results for input(s): "CKTOTAL", "CKMB", "CKMBINDEX", "TROPONINI" in the last 168 hours.  BNP: Invalid input(s): "POCBNP"  CBG: Recent Labs  Lab 03/06/23 1802 03/06/23 1857 03/06/23 2219  GLUCAP 67* 90 119*    Microbiology: Results for orders placed or performed during the hospital encounter of 03/05/23  Blood culture (single)     Status: None   Collection Time: 03/05/23 11:45 AM   Specimen: BLOOD  Result Value Ref Range Status   Specimen Description BLOOD BLOOD LEFT HAND  Final   Special Requests   Final    BOTTLES DRAWN AEROBIC AND ANAEROBIC Blood Culture adequate volume   Culture  Final    NO GROWTH 5 DAYS Performed at [facility], [address] [place], [place] [address]    Report Status 03/10/2023 FINAL  Final  SARS Coronavirus 2 by RT PCR (hospital order, performed in [facility] hospital lab) *cepheid single result test* Anterior Nasal Swab     Status: None   Collection Time: 03/05/23 12:02 PM   Specimen: Anterior Nasal Swab  Result Value Ref Range Status   SARS Coronavirus 2 by RT PCR NEGATIVE NEGATIVE Final    Comment: (NOTE) SARS-CoV-2 target nucleic acids are NOT DETECTED.  The SARS-CoV-2 RNA is generally detectable in upper and lower respiratory specimens during the acute phase of infection. The lowest concentration of SARS-CoV-2 viral copies this assay can detect is 250 copies / mL. A negative result does not preclude  SARS-CoV-2 infection and should not be used as the sole basis for treatment or other patient management decisions.  A negative result may occur with improper specimen collection / handling, submission of specimen other than nasopharyngeal swab, presence of viral mutation(s) within the areas targeted by this assay, and inadequate number of viral copies (<250 copies / mL). A negative result must be combined with clinical observations, patient history, and epidemiological information.  Fact Sheet for Patients:   RoadLapTop.co.za  Fact Sheet for Healthcare Providers: http://kim-miller.com/  This test is not yet approved or  cleared by the [place] FDA and has been authorized for detection and/or diagnosis of SARS-CoV-2 by FDA under an Emergency Use Authorization (EUA).  This EUA will remain in effect (meaning this test can be used) for the duration of the COVID-19 declaration under Section 564(b)(1) of the Act, 21 U.S.C. section 360bbb-3(b)(1), unless the authorization is terminated or revoked sooner.  Performed at [facility], [address] [place], [place] [address]   Respiratory (~20 pathogens) panel by PCR     Status: None   Collection Time: 03/05/23  4:00 PM   Specimen: Nasopharyngeal Swab; Respiratory  Result Value Ref Range Status   Adenovirus NOT DETECTED NOT DETECTED Final   Coronavirus 229E NOT DETECTED NOT DETECTED Final    Comment: (NOTE) The Coronavirus on the Respiratory Panel, DOES NOT test for the novel  Coronavirus (2019 nCoV)    Coronavirus HKU1 NOT DETECTED NOT DETECTED Final   Coronavirus NL63 NOT DETECTED NOT DETECTED Final   Coronavirus OC43 NOT DETECTED NOT DETECTED Final   Metapneumovirus NOT DETECTED NOT DETECTED Final   Rhinovirus / Enterovirus NOT DETECTED NOT DETECTED Final   Influenza A NOT DETECTED NOT DETECTED Final   Influenza B NOT DETECTED NOT DETECTED Final   Parainfluenza Virus 1  NOT DETECTED NOT DETECTED Final   Parainfluenza Virus 2 NOT DETECTED NOT DETECTED Final   Parainfluenza Virus 3 NOT DETECTED NOT DETECTED Final   Parainfluenza Virus 4 NOT DETECTED NOT DETECTED Final   Respiratory Syncytial Virus NOT DETECTED NOT DETECTED Final   Bordetella pertussis NOT DETECTED NOT DETECTED Final   Bordetella Parapertussis NOT DETECTED NOT DETECTED Final   Chlamydophila pneumoniae NOT DETECTED NOT DETECTED Final   Mycoplasma pneumoniae NOT DETECTED NOT DETECTED Final    Comment: Performed at [facility], [address] [place], [place] [address]    Coagulation Studies: No results for input(s): "LABPROT", "INR" in the last 72 hours.  Urinalysis: No results for input(s): "COLORURINE", "LABSPEC", "PHURINE", "GLUCOSEU", "HGBUR", "BILIRUBINUR", "KETONESUR", "PROTEINUR", "UROBILINOGEN", "NITRITE", "LEUKOCYTESUR" in the last 72 hours.  Invalid input(s): "APPERANCEUR"    Imaging: PERIPHERAL VASCULAR  CATHETERIZATION  Result Date: 03/08/2023 See surgical note for result.    Medications:    anticoagulant sodium citrate     azithromycin Stopped (03/09/23 1751)    amLODipine  10 mg Oral Daily   aspirin EC  81 mg Oral Daily   Chlorhexidine Gluconate Cloth  6 each Topical Q0600   cholecalciferol  1,000 Units Oral Daily   cloNIDine  0.1 mg Oral BID   feeding supplement (NEPRO CARB STEADY)  237 mL Oral BID BM   furosemide  80 mg Oral BID   gabapentin  100 mg Oral BID WC   gabapentin  200 mg Oral QHS   heparin  5,000 Units Subcutaneous Q8H   hydroxyurea  300 mg Oral Daily   latanoprost  1 drop Both Eyes QHS   levothyroxine  88 mcg Oral Daily   loratadine  10 mg Oral Daily   metoprolol tartrate  12.5 mg Oral BID   multivitamin  1 tablet Oral QHS   pantoprazole  40 mg Oral Daily   polyethylene glycol  17 g Oral Daily   rOPINIRole  0.5 mg Oral QHS   rosuvastatin  20 mg Oral Daily   timolol  1 drop Both Eyes q morning   acetaminophen, albuterol, ALPRAZolam,  alteplase, anticoagulant sodium citrate, heparin, lidocaine (PF), lidocaine-prilocaine, ondansetron **OR** ondansetron (ZOFRAN) IV, pentafluoroprop-tetrafluoroeth  Assessment/ Plan:  [name] is a 89 y.o.  female with coronary artery disease, diabetes type 2, chronic kidney disease, hyperlipidemia, hypertension, hypothyroidism    admitted on 03/05/2023 for Acute pulmonary edema (HCC) [J81.0] Renal failure [N19] ESRD (end stage renal disease) (HCC) [N18.6] Acute respiratory failure with hypoxia (HCC) [J96.01] Community acquired pneumonia, unspecified laterality [J18.9]    Principal Problem:   Acute respiratory failure with hypoxia (HCC) Active Problems:   Hypertension   Diabetes mellitus, type II (HCC)   Restless legs syndrome   Acquired hypothyroidism   CAD (coronary artery disease), native coronary artery   Peripheral neuropathy   Vitamin D deficiency   Acute renal failure superimposed on stage 4 chronic kidney disease (HCC)   Pneumonia   ESRD (end stage renal disease) (HCC)   Hyperkalemia   Renal failure     End Stage Renal Disease on hemodialysis: This is her second treatment and has been tolerating well.  No results found for: "POTASSIUM"  Intake/Output Summary (Last 24 hours) at 03/10/2023 1612 Last data filed at 03/10/2023 0500 Gross per 24 hour  Intake 619.63 ml  Output 1000 ml  Net -380.37 ml    2. Hypertension with chronic kidney disease: Blood pressure is well-controlled.   BP (!) 141/87 (BP Location: Right Arm)   Pulse 93   Temp 97.7 F (36.5 C) (Oral)   Resp 20   Wt 61 kg   SpO2 96%   BMI 26.26 kg/m   3. Anemia of chronic kidney disease/ kidney injury/chronic disease/acute blood loss:   Lab Results  Component Value Date   HGB 10.9 (L) 03/10/2023   Will continue anemia protocols with [facility]. 4. Secondary Hyperparathyroidism:     Lab Results  Component Value Date   CALCIUM 7.3 (L) 03/10/2023   PHOS 5.7 (H) 03/10/2023   Will check  PTH.  She may need phosphate binders.   Will continue to monitor closely.    LOS: 5 Lorain Childes, MD [facility] 6/15/20244:12 PM

## 2023-03-10 NOTE — TOC Progression Note (Signed)
Transition of Care [facility]) - Progression Note    Patient Details  Name: Cassandra [name] MRN: [id number] Date of Birth: May 05, 1927  Transition of Care [facility]) CM/SW Contact  Liliana Cline, LCSW Phone Number: 03/10/2023, 10:48 AM  Clinical Narrative:    Patient reported to PT that she now wants SNF. Called patient's spouse Cassandra [name] who confirms they want SNF now. He is not familiar with the options in the area. SNF work up started.    Expected Discharge Plan: Home w Home Health Services    Expected Discharge Plan and Services                         DME Arranged:  (May need home oxygen)   Date DME Agency Contacted: 03/09/23 (sent referral awaiting response) Time DME Agency Contacted: 1557 Representative spoke with at DME Agency: Mayra Reel of [facility] HH Arranged: PT, OT [facility] Agency: [facility] Date [facility] Agency Contacted: 03/09/23 Time HH Agency Contacted: 1559 Representative spoke with at [facility] Agency: Amy Hyatt   Social Determinants of Health (SDOH) Interventions SDOH Screenings   Food Insecurity: No Food Insecurity (03/06/2023)  Housing: Low Risk  (03/06/2023)  Transportation Needs: No Transportation Needs (03/06/2023)  Utilities: Not At Risk (03/06/2023)  Tobacco Use: Medium Risk (03/09/2023)    Readmission Risk Interventions    03/09/2023    4:09 PM  Readmission Risk Prevention Plan  Transportation Screening Complete  PCP or Specialist Appt within 3-5 Days Complete  HRI or Home Care Consult Complete  Social Work Consult for Recovery Care Planning/Counseling Complete  Palliative Care Screening Not Applicable  Medication Review [employer]) Complete

## 2023-03-10 NOTE — Progress Notes (Signed)
Hemodialysis note  Received patient in bed to unit. Alert and oriented.  Informed consent signed and in chart.  Treatment initiated: 1258 Treatment completed: 1606  Patient tolerated well. Transported back to room, alert without acute distress.  Report given to patient's RN.   Access used: Right Chest HD Catheter Access issues: none  Total UF removed: 1L Medication(s) given:  none Post HD weight: 60.3 kg   [facility]

## 2023-03-10 NOTE — Plan of Care (Signed)
  Problem: Activity: Goal: Ability to tolerate increased activity will improve Outcome: Progressing   Problem: Clinical Measurements: Goal: Ability to maintain a body temperature in the normal range will improve Outcome: Progressing   Problem: Respiratory: Goal: Ability to maintain adequate ventilation will improve Outcome: Progressing Goal: Ability to maintain a clear airway will improve Outcome: Progressing   Problem: Education: Goal: Knowledge of disease and its progression will improve Outcome: Progressing Goal: Individualized Educational Video(s) Outcome: Progressing   Problem: Fluid Volume: Goal: Compliance with measures to maintain balanced fluid volume will improve Outcome: Progressing   Problem: Health Behavior/Discharge Planning: Goal: Ability to manage health-related needs will improve Outcome: Progressing   Problem: Nutritional: Goal: Ability to make healthy dietary choices will improve Outcome: Progressing   Problem: Clinical Measurements: Goal: Complications related to the disease process, condition or treatment will be avoided or minimized Outcome: Progressing   Problem: Education: Goal: Knowledge of General Education information will improve Description: Including pain rating scale, medication(s)/side effects and non-pharmacologic comfort measures Outcome: Progressing   Problem: Health Behavior/Discharge Planning: Goal: Ability to manage health-related needs will improve Outcome: Progressing   Problem: Clinical Measurements: Goal: Ability to maintain clinical measurements within normal limits will improve Outcome: Progressing Goal: Will remain free from infection Outcome: Progressing Goal: Diagnostic test results will improve Outcome: Progressing Goal: Respiratory complications will improve Outcome: Progressing Goal: Cardiovascular complication will be avoided Outcome: Progressing   Problem: Activity: Goal: Risk for activity intolerance will  decrease Outcome: Progressing   Problem: Nutrition: Goal: Adequate nutrition will be maintained Outcome: Progressing   Problem: Coping: Goal: Level of anxiety will decrease Outcome: Progressing   Problem: Elimination: Goal: Will not experience complications related to bowel motility Outcome: Progressing Goal: Will not experience complications related to urinary retention Outcome: Progressing   Problem: Pain Managment: Goal: General experience of comfort will improve Outcome: Progressing   Problem: Safety: Goal: Ability to remain free from injury will improve Outcome: Progressing   Problem: Skin Integrity: Goal: Risk for impaired skin integrity will decrease Outcome: Progressing

## 2023-03-10 NOTE — NC FL2 (Signed)
MEDICAID FL2 LEVEL OF CARE FORM     IDENTIFICATION  Patient Name: Cassandra [name] Birthdate: 09-17-1927 Sex: female Admission Date (Current Location): 03/05/2023  [facility] and [place] Number:  Chiropodist and Address:  [facility], [address], [place], [place] [address]      Provider Number:    Attending Physician Name and Address:  [name], DO  Relative Name and Phone Number:  Kawanda Burlingham [phone]    Current Level of Care: Hospital Recommended Level of Care: Skilled Nursing Facility Prior Approval Number:    Date Approved/Denied:   PASRR Number: [id number]  Discharge Plan: SNF    Current Diagnoses: Patient Active Problem List   Diagnosis Date Noted   Renal failure 03/06/2023   Acute respiratory failure with hypoxia (HCC) 03/05/2023   Pneumonia 03/05/2023   ESRD (end stage renal disease) (HCC) 03/05/2023   Hyperkalemia 03/05/2023   Overweight (BMI 25.0-29.9) 12/14/2021   Acute renal failure superimposed on stage 4 chronic kidney disease (HCC) 12/14/2021   Hematochezia, recurrent 12/11/2021   Recent cerebrovascular accident 10/12/2021 (CVA) 12/11/2021   Hypotension 12/11/2021   GI bleed 12/11/2021   Acute blood loss anemia 12/11/2021   Cholelithiasis 12/11/2021   Acute CVA (cerebrovascular accident) (HCC) 10/13/2021   Leukocytosis 10/13/2021   Rectal bleeding 10/03/2021   Bright red rectal bleeding 10/02/2021   GIB (gastrointestinal bleeding) 10/02/2021   Hypoxemia 05/15/2021   Hypoxia 05/14/2021   Hypertensive urgency 05/14/2021   Tibial plateau fracture 04/21/21, left, closed, with routine healing, subsequent encounter 05/14/2021   CKD (chronic kidney disease) stage 4, GFR 15-29 ml/min (HCC) 04/07/2021   Acquired hypothyroidism 12/02/2020   Hyperlipidemia, mixed 12/02/2020   Aortic atherosclerosis (HCC) 10/04/2020   Polycythemia vera (HCC) 06/02/2020   Thrombocytosis 02/18/2020   Vitamin  D deficiency 03/25/2018   Hypertension 01/24/2015   Diabetes mellitus, type II (HCC) 01/24/2015   Restless legs syndrome 01/24/2015   Chronic constipation 01/24/2015   Lumbar stenosis with neurogenic claudication 05/04/2014   Peripheral neuropathy 03/09/2014   CAD (coronary artery disease), native coronary artery 01/07/2014    Orientation RESPIRATION BLADDER Height & Weight     Self, Place  Other (Comment) (Nasal [place]) Continent, External catheter Weight: 134 lb 7.7 oz (61 kg) Height:     BEHAVIORAL SYMPTOMS/MOOD NEUROLOGICAL BOWEL NUTRITION STATUS   (None)   Incontinent Diet (Renal w. Fluid Restriction)  AMBULATORY STATUS COMMUNICATION OF NEEDS Skin   Extensive Assist Verbally Other (Comment) (Rash-L Upper Arm)                       Personal Care Assistance Level of Assistance  Bathing, Feeding, Dressing Bathing Assistance: Maximum assistance Feeding assistance: Independent Dressing Assistance: Maximum assistance Total Care Assistance: Limited assistance   Functional Limitations Info  Sight, Hearing, Speech Sight Info: Adequate Hearing Info: Adequate Speech Info: Adequate    SPECIAL CARE FACTORS FREQUENCY  PT (By licensed PT), OT (By licensed OT)     PT Frequency: 5x per week OT Frequency: 5x per week            Contractures Contractures Info: Not present    Additional Factors Info  Allergies, Code Status Code Status Info: DNR Allergies Info: Atorvastatin  Cyclobenzaprine  Hydrocodone-acetaminophen  Mirtazapine  Oxycodone-acetaminophen  Paroxetine Hcl  Penicillins  Propoxyphene  Trazodone           Current Medications (03/10/2023):  This is the current hospital active medication list Current Facility-Administered Medications  Medication Dose Route Frequency Provider Last Rate Last Admin   acetaminophen (TYLENOL) tablet 650 mg  650 mg Oral Q6H PRN Annice Needy, MD   650 mg at 03/07/23 1323   albuterol (PROVENTIL) (2.5 MG/3ML) 0.083% nebulizer  solution 2.5 mg  2.5 mg Inhalation Q4H PRN Annice Needy, MD       ALPRAZolam Prudy Feeler) tablet 0.25 mg  0.25 mg Oral BID PRN Annice Needy, MD   0.25 mg at 03/09/23 2240   alteplase (CATHFLO ACTIVASE) injection 2 mg  2 mg Intracatheter Once PRN Annice Needy, MD       amLODipine (NORVASC) tablet 10 mg  10 mg Oral Daily Annice Needy, MD   10 mg at 03/10/23 0847   anticoagulant sodium citrate solution 5 mL  5 mL Intracatheter PRN Annice Needy, MD       aspirin EC tablet 81 mg  81 mg Oral Daily Annice Needy, MD   81 mg at 03/10/23 0849   azithromycin (ZITHROMAX) 500 mg in sodium chloride 0.9 % 250 mL IVPB  500 mg Intravenous Q24H [name], DO   Stopped at 03/09/23 1751   Chlorhexidine Gluconate Cloth 2 % PADS 6 each  6 each Topical Q0600 Annice Needy, MD   6 each at 03/10/23 0615   cholecalciferol (VITAMIN D3) 25 MCG (1000 UNIT) tablet 1,000 Units  1,000 Units Oral Daily Annice Needy, MD   1,000 Units at 03/10/23 0849   cloNIDine (CATAPRES) tablet 0.1 mg  0.1 mg Oral BID Annice Needy, MD   0.1 mg at 03/10/23 0847   feeding supplement (NEPRO CARB STEADY) liquid 237 mL  237 mL Oral BID BM [name], DO   237 mL at 03/10/23 0851   furosemide (LASIX) tablet 80 mg  80 mg Oral BID Annice Needy, MD   80 mg at 03/10/23 0847   gabapentin (NEURONTIN) capsule 100 mg  100 mg Oral BID WC Annice Needy, MD   100 mg at 03/10/23 0847   gabapentin (NEURONTIN) capsule 200 mg  200 mg Oral QHS Annice Needy, MD   200 mg at 03/09/23 2233   heparin injection 1,000 Units  1,000 Units Intracatheter PRN Annice Needy, MD   1,000 Units at 03/06/23 1553   heparin injection 5,000 Units  5,000 Units Subcutaneous Q8H Annice Needy, MD   5,000 Units at 03/10/23 1610   hydroxyurea (DROXIA) capsule 300 mg  300 mg Oral Daily Annice Needy, MD   300 mg at 03/09/23 1001   latanoprost (XALATAN) 0.005 % ophthalmic solution 1 drop  1 drop Both Eyes QHS Annice Needy, MD   1 drop at 03/09/23 2235   levothyroxine (SYNTHROID) tablet  88 mcg  88 mcg Oral Daily Annice Needy, MD   88 mcg at 03/10/23 0614   lidocaine (PF) (XYLOCAINE) 1 % injection 5 mL  5 mL Intradermal PRN Annice Needy, MD       lidocaine-prilocaine (EMLA) cream 1 Application  1 Application Topical PRN Annice Needy, MD       loratadine (CLARITIN) tablet 10 mg  10 mg Oral Daily Annice Needy, MD   10 mg at 03/10/23 0849   multivitamin (RENA-VIT) tablet 1 tablet  1 tablet Oral QHS Annice Needy, MD   1 tablet at 03/09/23 2234   ondansetron (ZOFRAN) tablet 4 mg  4 mg Oral Q6H PRN Annice Needy, MD  Or   ondansetron (ZOFRAN) injection 4 mg  4 mg Intravenous Q6H PRN Annice Needy, MD       pantoprazole (PROTONIX) EC tablet 40 mg  40 mg Oral Daily Annice Needy, MD   40 mg at 03/10/23 0849   pentafluoroprop-tetrafluoroeth (GEBAUERS) aerosol 1 Application  1 Application Topical PRN Annice Needy, MD       polyethylene glycol (MIRALAX / GLYCOLAX) packet 17 g  17 g Oral Daily Annice Needy, MD   17 g at 03/10/23 0849   rOPINIRole (REQUIP) tablet 0.5 mg  0.5 mg Oral QHS Annice Needy, MD   0.5 mg at 03/09/23 2233   rosuvastatin (CRESTOR) tablet 20 mg  20 mg Oral Daily Annice Needy, MD   20 mg at 03/10/23 0847   timolol (TIMOPTIC) 0.5 % ophthalmic solution 1 drop  1 drop Both Eyes q morning Annice Needy, MD   1 drop at 03/09/23 1008     Discharge Medications: Please see discharge summary for a list of discharge medications.  Relevant Imaging Results:  Relevant Lab Results:   Additional Information [id number]  Colette Ribas, LCSWA

## 2023-03-10 NOTE — Progress Notes (Signed)
PROGRESS NOTE    Cassandra [name]   WGN:[id number] DOB: 02/05/1927  DOA: 03/05/2023 Date of Service: 03/10/23 PCP: Danella Penton, MD     Brief Narrative / Hospital Course:  Cassandra [name] is a 89 y.o. female with medical history significant of coronary artery disease, type 2 diabetes, stage IV CKD, hyperlipidemia, hypertension, hypothyroidism presenting with acute respiratory failure with hypoxia, volume overload, acute on chronic stage IV CKD, pneumonia.  Patient reports progressive increased work of breathing over the past 2 to 3 days.  06/10: ER afebrile, hemodynamically stable, satting in the 60s room air, transition from nonrebreather to BiPAP at 60% FiO2. Given 80 mg IV Lasix. As well as DuoNebs. White count 21, hemoglobin 12, platelets 690, COVID-negative, VBG grossly stable, creatinine 7.7, potassium 5.8. Bicarb 16. BNP of 792. Troponin 48. Chest x-ray with borderline cardiomegaly and interstitial edema as well as hazy by basilar airspace opacities concerning for pneumonia. Vascular surgery placed temporary cath at the bedside. Resp PCR neg. CT chest adenopathy, mild interstitial edema and mild effusion, will need outpatient f/u.  06/11: hemodialysis initiated today. Requiring HFNC O2 06/12: continue HD, plan permcath placement tomorrow.  06/13: weaning on O2 down to 5L/min. Permcath placement  06/14: confirmed outpatient HD setup for Tuesday - plan HD tomorrow, hopefully can reduce O2 requirement, she is still needing 5L/min. PT/OT recs for SNF 06/15 (Sat): HD today, placement pending, O2 down to 2L   Consultants:  Vascular surgery Nephrology   Procedures: 06/10: R femoral temp cath placement for HD - removed 06/13 06/13: R IJ perm cath placement      ASSESSMENT & PLAN:   Principal Problem:   Acute respiratory failure with hypoxia (HCC) Active Problems:   Pneumonia   Acute renal failure superimposed on stage 4 chronic kidney disease (HCC)   Diabetes mellitus, type  II (HCC)   Hypertension   CAD (coronary artery disease), native coronary artery   Acquired hypothyroidism   ESRD (end stage renal disease) (HCC)   Hyperkalemia   Acute respiratory failure with hypoxia (HCC) d/t fluid overload in setting of renal failure, also complicated by potential pneumonia noted on CXR and supported by leukocytosis and elevated procalcitonin O2 support noninvasive, pt is DNR  Treat underlying causes as below  Follow respiratory status with diuresis, hemodialysis and pneumonia treatment - improving   ESRD Hemodialysis initiated Renal navigator confirmed TTS outpatient dialysis    Questionable Community Acquired Pneumonia Urine Legionella, urine strep, BCx, Expanded respiratory panel negative for viral illness. CT chest to better correlate --> no lobular infiltrate   White count trending down - monitor CBC Procalcitonin 0.8 --> 3.52, s/p azithromycin to cover pneumonia x5 days   Acquired hypothyroidism Continue Synthroid   CAD (coronary artery disease), native coronary artery No active chest pain Troponin negative x 1 EKG stable Follow ASA, statin, BB  Diabetes mellitus, type II (HCC) Monitor Glc   Hypertension BP elevated Amlodipine, clonidine restarted Continue Lasix  Hyperkalemia - improved  K 5.8 in setting of acute on chronic stage 4 CKD- requiring HD  EKG stable  Monitor closely  Abn CT chest  Mild mediastinal adenopathy, RIGHT hilar adenopathy and RIGHT lower lobe atelectasis. Small RIGHT effusion. Differential include RIGHT hilar malignancy versus benign effusion and atelectasis versus pneumonia. Mild interstitial edema pattern. Recommend clinical correlation and follow-up contrast CT in 4 to 6 weeks.   GERD w/ Hx GI bleed  Protonix   HLD Crestor   Anxiety Alprazolam prn   Hx CVA ASA, statin  Neuropathy and RLS Gabapentin     DVT prophylaxis: heparin  Pertinent IV fluids/nutrition: no continuous IV fluids  Central  lines / invasive devices: R IJ Permcath   Code Status: DNR ACP documentation reviewed: 03/06/23 none on file w/ [facility]   Current Admission Status: inpatient   TOC needs / Dispo plan: recs for SNF  Barriers to discharge / significant pending items: placement SNF              Subjective / Brief ROS:  Patient reports breathing better and better each day Asking about discharge - thinking now she might want to go to SNF as opposed to [facility]  Denies CP Pain controlled.  Denies new weakness.  Reports no concerns w/ urination/defecation.   Family Communication: none at this time  TOC has been in contact w/ husband     Objective Findings:  Vitals:   03/10/23 0527 03/10/23 0814 03/10/23 1240 03/10/23 1258  BP: (!) 150/99 (!) 162/68 131/77 128/69  Pulse: 88 88 (!) 101 100  Resp: 18 20 16  (!) 23  Temp: 97.6 F (36.4 C)  97.7 F (36.5 C)   TempSrc: Oral  Oral   SpO2: 94% 92% 95% 100%  Weight:   61 kg     Intake/Output Summary (Last 24 hours) at 03/10/2023 1327 Last data filed at 03/10/2023 0500 Gross per 24 hour  Intake 619.63 ml  Output 1000 ml  Net -380.37 ml   Filed Weights   03/07/23 1300 03/08/23 0817 03/10/23 1240  Weight: 66.3 kg 61 kg 61 kg    Examination:  Physical Exam Constitutional:      General: She is not in acute distress.    Appearance: She is well-developed.  Cardiovascular:     Rate and Rhythm: Normal rate and regular rhythm.  Pulmonary:     Effort: Pulmonary effort is normal.     Breath sounds: Normal breath sounds.  Musculoskeletal:     Right lower leg: No edema.     Left lower leg: No edema.  Skin:    General: Skin is warm and dry.  Neurological:     General: No focal deficit present.     Mental Status: She is alert and oriented to person, place, and time.  Psychiatric:        Mood and Affect: Mood normal.        Behavior: Behavior normal.          Scheduled Medications:   amLODipine  10 mg Oral Daily   aspirin EC  81 mg Oral  Daily   Chlorhexidine Gluconate Cloth  6 each Topical Q0600   cholecalciferol  1,000 Units Oral Daily   cloNIDine  0.1 mg Oral BID   feeding supplement (NEPRO CARB STEADY)  237 mL Oral BID BM   furosemide  80 mg Oral BID   gabapentin  100 mg Oral BID WC   gabapentin  200 mg Oral QHS   heparin  5,000 Units Subcutaneous Q8H   hydroxyurea  300 mg Oral Daily   latanoprost  1 drop Both Eyes QHS   levothyroxine  88 mcg Oral Daily   loratadine  10 mg Oral Daily   metoprolol tartrate  12.5 mg Oral BID   multivitamin  1 tablet Oral QHS   pantoprazole  40 mg Oral Daily   polyethylene glycol  17 g Oral Daily   rOPINIRole  0.5 mg Oral QHS   rosuvastatin  20 mg Oral Daily   timolol  1 drop  Both Eyes q morning    Continuous Infusions:  anticoagulant sodium citrate     azithromycin Stopped (03/09/23 1751)    PRN Medications:  acetaminophen, albuterol, ALPRAZolam, alteplase, anticoagulant sodium citrate, heparin, lidocaine (PF), lidocaine-prilocaine, ondansetron **OR** ondansetron (ZOFRAN) IV, pentafluoroprop-tetrafluoroeth  Antimicrobials from admission:  Anti-infectives (From admission, onward)    Start     Dose/Rate Route Frequency Ordered Stop   03/08/23 0803  ceFAZolin (ANCEF) IVPB 1 g/50 mL premix  Status:  Discontinued        1 g 100 mL/hr over 30 Minutes Intravenous 30 min pre-op 03/08/23 0803 03/08/23 1706   03/06/23 1500  azithromycin (ZITHROMAX) 500 mg in sodium chloride 0.9 % 250 mL IVPB        500 mg 250 mL/hr over 60 Minutes Intravenous Every 24 hours 03/05/23 1516 03/11/23 1459   03/06/23 1200  cefTRIAXone (ROCEPHIN) 2 g in sodium chloride 0.9 % 100 mL IVPB        2 g 200 mL/hr over 30 Minutes Intravenous Every 24 hours 03/05/23 1516 03/09/23 1333   03/05/23 1345  cefTRIAXone (ROCEPHIN) 2 g in sodium chloride 0.9 % 100 mL IVPB        2 g 200 mL/hr over 30 Minutes Intravenous  Once 03/05/23 1330 03/05/23 1432   03/05/23 1345  azithromycin (ZITHROMAX) 500 mg in sodium  chloride 0.9 % 250 mL IVPB        500 mg 250 mL/hr over 60 Minutes Intravenous  Once 03/05/23 1330 03/05/23 1703           Data Reviewed:  I have personally reviewed the following...  CBC: Recent Labs  Lab 03/05/23 1146 03/06/23 0609 03/07/23 0505 03/08/23 0835 03/10/23 1250  WBC 21.0* 19.5* 17.6* 14.6* PENDING  NEUTROABS 16.0*  --   --   --   --   HGB 12.3 11.7* 12.0 11.4* 10.9*  HCT 40.1 38.8 39.0 37.3 36.5  MCV 85.0 85.5 82.5 83.4 85.7  PLT 690* 652* 595* 520* 420*   Basic Metabolic Panel: Recent Labs  Lab 03/05/23 1146 03/06/23 0609 03/07/23 0505 03/08/23 0835  NA 137 139 138 139  K 5.8* 6.2* 4.2 3.9  CL 109 109 103 102  CO2 16* 18* 22 25  GLUCOSE 90 74 96 83  BUN 77* 81* 56* 38*  CREATININE 7.71* 8.52* 6.19* 5.05*  CALCIUM 7.7* 7.6* 7.0* 7.5*  PHOS  --   --   --  5.5*   GFR: CrCl cannot be calculated (Unknown ideal weight.). Liver Function Tests: Recent Labs  Lab 03/05/23 1146 03/06/23 0609 03/08/23 0835  AST 23 28  --   ALT 15 12  --   ALKPHOS 47 46  --   BILITOT 0.7 1.0  --   PROT 6.9 6.5  --   ALBUMIN 2.6* 2.5* 2.1*   No results for input(s): "LIPASE", "AMYLASE" in the last 168 hours. No results for input(s): "AMMONIA" in the last 168 hours. Coagulation Profile: No results for input(s): "INR", "PROTIME" in the last 168 hours. Cardiac Enzymes: No results for input(s): "CKTOTAL", "CKMB", "CKMBINDEX", "TROPONINI" in the last 168 hours. BNP (last 3 results) No results for input(s): "PROBNP" in the last 8760 hours. HbA1C: No results for input(s): "HGBA1C" in the last 72 hours. CBG: Recent Labs  Lab 03/06/23 1802 03/06/23 1857 03/06/23 2219  GLUCAP 67* 90 119*   Lipid Profile: No results for input(s): "CHOL", "HDL", "LDLCALC", "TRIG", "CHOLHDL", "LDLDIRECT" in the last 72 hours. Thyroid Function Tests: No results  for input(s): "TSH", "T4TOTAL", "FREET4", "T3FREE", "THYROIDAB" in the last 72 hours. Anemia Panel: No results for  input(s): "VITAMINB12", "FOLATE", "FERRITIN", "TIBC", "IRON", "RETICCTPCT" in the last 72 hours. Most Recent Urinalysis On File:     Component Value Date/Time   COLORURINE STRAW (A) 12/12/2021 1702   APPEARANCEUR CLEAR (A) 12/12/2021 1702   LABSPEC 1.005 12/12/2021 1702   PHURINE 7.0 12/12/2021 1702   GLUCOSEU 50 (A) 12/12/2021 1702   HGBUR MODERATE (A) 12/12/2021 1702   BILIRUBINUR NEGATIVE 12/12/2021 1702   KETONESUR NEGATIVE 12/12/2021 1702   PROTEINUR 100 (A) 12/12/2021 1702   NITRITE NEGATIVE 12/12/2021 1702   LEUKOCYTESUR SMALL (A) 12/12/2021 1702   Sepsis Labs: @LABRCNTIP (procalcitonin:4,lacticidven:4) Microbiology: Recent Results (from the past 240 hour(s))  Blood culture (single)     Status: None   Collection Time: 03/05/23 11:45 AM   Specimen: BLOOD  Result Value Ref Range Status   Specimen Description BLOOD BLOOD LEFT HAND  Final   Special Requests   Final    BOTTLES DRAWN AEROBIC AND ANAEROBIC Blood Culture adequate volume   Culture   Final    NO GROWTH 5 DAYS Performed at [facility], [address], [place], [place] [address]    Report Status 03/10/2023 FINAL  Final  SARS Coronavirus 2 by RT PCR (hospital order, performed in [facility] hospital lab) *cepheid single result test* Anterior Nasal Swab     Status: None   Collection Time: 03/05/23 12:02 PM   Specimen: Anterior Nasal Swab  Result Value Ref Range Status   SARS Coronavirus 2 by RT PCR NEGATIVE NEGATIVE Final    Comment: (NOTE) SARS-CoV-2 target nucleic acids are NOT DETECTED.  The SARS-CoV-2 RNA is generally detectable in upper and lower respiratory specimens during the acute phase of infection. The lowest concentration of SARS-CoV-2 viral copies this assay can detect is 250 copies / mL. A negative result does not preclude SARS-CoV-2 infection and should not be used as the sole basis for treatment or other patient management decisions.  A negative result may occur with improper specimen  collection / handling, submission of specimen other than nasopharyngeal swab, presence of viral mutation(s) within the areas targeted by this assay, and inadequate number of viral copies (<250 copies / mL). A negative result must be combined with clinical observations, patient history, and epidemiological information.  Fact Sheet for Patients:   RoadLapTop.co.za  Fact Sheet for Healthcare Providers: http://kim-miller.com/  This test is not yet approved or  cleared by the [place] FDA and has been authorized for detection and/or diagnosis of SARS-CoV-2 by FDA under an Emergency Use Authorization (EUA).  This EUA will remain in effect (meaning this test can be used) for the duration of the COVID-19 declaration under Section 564(b)(1) of the Act, 21 U.S.C. section 360bbb-3(b)(1), unless the authorization is terminated or revoked sooner.  Performed at [facility], [address] [place], [place] [address]   Respiratory (~20 pathogens) panel by PCR     Status: None   Collection Time: 03/05/23  4:00 PM   Specimen: Nasopharyngeal Swab; Respiratory  Result Value Ref Range Status   Adenovirus NOT DETECTED NOT DETECTED Final   Coronavirus 229E NOT DETECTED NOT DETECTED Final    Comment: (NOTE) The Coronavirus on the Respiratory Panel, DOES NOT test for the novel  Coronavirus (2019 nCoV)    Coronavirus HKU1 NOT DETECTED NOT DETECTED Final   Coronavirus NL63 NOT DETECTED NOT DETECTED Final   Coronavirus OC43 NOT DETECTED NOT DETECTED Final  Metapneumovirus NOT DETECTED NOT DETECTED Final   Rhinovirus / Enterovirus NOT DETECTED NOT DETECTED Final   Influenza A NOT DETECTED NOT DETECTED Final   Influenza B NOT DETECTED NOT DETECTED Final   Parainfluenza Virus 1 NOT DETECTED NOT DETECTED Final   Parainfluenza Virus 2 NOT DETECTED NOT DETECTED Final   Parainfluenza Virus 3 NOT DETECTED NOT DETECTED Final   Parainfluenza Virus 4  NOT DETECTED NOT DETECTED Final   Respiratory Syncytial Virus NOT DETECTED NOT DETECTED Final   Bordetella pertussis NOT DETECTED NOT DETECTED Final   Bordetella Parapertussis NOT DETECTED NOT DETECTED Final   Chlamydophila pneumoniae NOT DETECTED NOT DETECTED Final   Mycoplasma pneumoniae NOT DETECTED NOT DETECTED Final    Comment: Performed at [facility], [address], [place], [place] [address]      Radiology Studies last 3 days: PERIPHERAL VASCULAR CATHETERIZATION  Result Date: 03/08/2023 See surgical note for result.  ECHOCARDIOGRAM COMPLETE  Result Date: 03/07/2023    ECHOCARDIOGRAM REPORT   Patient Name:   JANIELYS Weinel Date of Exam: 03/06/2023 Medical Rec #:  [id number]       Height:       60.0 in Accession #:    [id number]      Weight:       153.1 lb Date of Birth:  [birth year]      BSA:          1.666 m Patient Age:    89 years        BP:           150/67 mmHg Patient Gender: F               HR:           85 bpm. Exam Location:  [facility] Procedure: 2D Echo, Cardiac Doppler and Color Doppler Indications:     Other cardiac sounds  History:         Patient has prior history of Echocardiogram examinations, most                  recent 10/14/2021. CAD, Stroke; Risk Factors:Hypertension,                  Diabetes and Dyslipidemia. ESRD.  Sonographer:     Mikki Harbor Referring Phys:  [phone] Francoise Schaumann [place] Diagnosing Phys: Marcina Millard MD IMPRESSIONS  1. Left ventricular ejection fraction, by estimation, is 60 to 65%. The left ventricle has normal function. The left ventricle has no regional wall motion abnormalities. Left ventricular diastolic parameters are consistent with Grade I diastolic dysfunction (impaired relaxation).  2. Right ventricular systolic function is normal. The right ventricular size is normal. There is normal pulmonary artery systolic pressure.  3. The mitral valve is normal in structure. Mild to moderate mitral valve regurgitation. No evidence of mitral  stenosis.  4. The aortic valve is normal in structure. Aortic valve regurgitation is mild to moderate. No aortic stenosis is present.  5. The inferior vena cava is normal in size with greater than 50% respiratory variability, suggesting right atrial pressure of 3 mmHg. FINDINGS  Left Ventricle: Left ventricular ejection fraction, by estimation, is 60 to 65%. The left ventricle has normal function. The left ventricle has no regional wall motion abnormalities. The left ventricular internal cavity size was normal in size. There is  no left ventricular hypertrophy. Left ventricular diastolic parameters are consistent with Grade I diastolic dysfunction (impaired relaxation). Right Ventricle: The right ventricular size is normal. No  increase in right ventricular wall thickness. Right ventricular systolic function is normal. There is normal pulmonary artery systolic pressure. The tricuspid regurgitant velocity is 2.37 m/s, and  with an assumed right atrial pressure of 3 mmHg, the estimated right ventricular systolic pressure is 25.5 mmHg. Left Atrium: Left atrial size was normal in size. Right Atrium: Right atrial size was normal in size. Pericardium: There is no evidence of pericardial effusion. Mitral Valve: The mitral valve is normal in structure. Mild to moderate mitral valve regurgitation. No evidence of mitral valve stenosis. MV peak gradient, 9.6 mmHg. The mean mitral valve gradient is 3.0 mmHg. Tricuspid Valve: The tricuspid valve is normal in structure. Tricuspid valve regurgitation is not demonstrated. No evidence of tricuspid stenosis. Aortic Valve: The aortic valve is normal in structure. Aortic valve regurgitation is mild to moderate. No aortic stenosis is present. Aortic valve mean gradient measures 7.5 mmHg. Aortic valve peak gradient measures 13.5 mmHg. Aortic valve area, by VTI measures 2.16 cm. Pulmonic Valve: The pulmonic valve was normal in structure. Pulmonic valve regurgitation is not visualized. No  evidence of pulmonic stenosis. Aorta: The aortic root is normal in size and structure. Venous: The inferior vena cava is normal in size with greater than 50% respiratory variability, suggesting right atrial pressure of 3 mmHg. IAS/Shunts: No atrial level shunt detected by color flow Doppler.  LEFT VENTRICLE PLAX 2D LVIDd:         4.60 cm   Diastology LVIDs:         2.90 cm   LV e' medial:    6.53 cm/s LV PW:         1.40 cm   LV E/e' medial:  12.0 LV IVS:        1.20 cm   LV e' lateral:   9.03 cm/s LVOT diam:     2.00 cm   LV E/e' lateral: 8.7 LV SV:         86 LV SV Index:   52 LVOT Area:     3.14 cm  RIGHT VENTRICLE RV Basal diam:  2.75 cm RV Mid diam:    2.70 cm RV S prime:     16.00 cm/s LEFT ATRIUM             Index        RIGHT ATRIUM           Index LA diam:        3.80 cm 2.28 cm/m   RA Area:     13.60 cm LA Vol (A2C):   71.0 ml 42.61 ml/m  RA Volume:   29.80 ml  17.88 ml/m LA Vol (A4C):   42.5 ml 25.51 ml/m LA Biplane Vol: 58.3 ml 34.99 ml/m  AORTIC VALVE                     PULMONIC VALVE AV Area (Vmax):    2.27 cm      PV Vmax:       0.94 m/s AV Area (Vmean):   2.19 cm      PV Peak grad:  3.5 mmHg AV Area (VTI):     2.16 cm AV Vmax:           184.00 cm/s AV Vmean:          124.500 cm/s AV VTI:            0.400 m AV Peak Grad:      13.5 mmHg AV Mean Grad:  7.5 mmHg LVOT Vmax:         133.00 cm/s LVOT Vmean:        86.800 cm/s LVOT VTI:          0.275 m LVOT/AV VTI ratio: 0.69  AORTA Ao Root diam: 2.90 cm MITRAL VALVE                TRICUSPID VALVE MV Area (PHT): 3.46 cm     TR Peak grad:   22.5 mmHg MV Area VTI:   2.57 cm     TR Vmax:        237.00 cm/s MV Peak grad:  9.6 mmHg MV Mean grad:  3.0 mmHg     SHUNTS MV Vmax:       1.55 m/s     Systemic VTI:  0.28 m MV Vmean:      83.4 cm/s    Systemic Diam: 2.00 cm MV Decel Time: 219 msec MV E velocity: 78.60 cm/s MV A velocity: 148.00 cm/s MV E/A ratio:  0.53 Marcina Millard MD Electronically signed by Marcina Millard MD Signature  Date/Time: 03/07/2023/4:39:51 PM    Final              LOS: 5 days     [name], DO [facility] 03/10/2023, 1:27 PM    Dictation software may have been used to generate the above note. Typos may occur and escape review in typed/dictated notes. Please contact Dr Lyn Hollingshead directly for clarity if needed.  Staff may message me via secure chat in Epic  but this may not receive an immediate response,  please page me for urgent matters!  If 7PM-7AM, please contact night coverage [email or web address]

## 2023-03-11 DIAGNOSIS — J9601 Acute respiratory failure with hypoxia: Secondary | ICD-10-CM | POA: Diagnosis not present

## 2023-03-11 MED ORDER — ROSUVASTATIN CALCIUM 10 MG PO TABS
10.0000 mg | ORAL_TABLET | Freq: Every day | ORAL | Status: DC
  Administered 2023-03-12 – 2023-03-13 (×2): 10 mg via ORAL
  Filled 2023-03-11 (×2): qty 1

## 2023-03-11 NOTE — Progress Notes (Signed)
[facility]  PROGRESS NOTE   Subjective:   Patient feels much better.  Had stable dialysis yesterday.  Objective:  Vital signs: Blood pressure 113/60, pulse 67, temperature 98.3 F (36.8 C), temperature source Oral, resp. rate 16, weight 60.3 kg, SpO2 93 %.  Intake/Output Summary (Last 24 hours) at 03/11/2023 1953 Last data filed at 03/11/2023 1853 Gross per 24 hour  Intake 720 ml  Output 650 ml  Net 70 ml   Filed Weights   03/08/23 0817 03/10/23 1240 03/10/23 1620  Weight: 61 kg 61 kg 60.3 kg     Physical Exam: General:  No acute distress  Head:  Normocephalic, atraumatic. Moist oral mucosal membranes  Eyes:  Anicteric  Neck:  Supple  Lungs:   Clear to auscultation, normal effort  Heart:  S1S2 no rubs  Abdomen:   Soft, nontender, bowel sounds present  Extremities:  peripheral edema.  Neurologic:  Awake, alert, following commands  Skin:  No lesions  Access:     Basic Metabolic Panel: Recent Labs  Lab 03/05/23 1146 03/06/23 0609 03/07/23 0505 03/08/23 0835 03/10/23 1250  NA 137 139 138 139 131*  K 5.8* 6.2* 4.2 3.9 3.5  CL 109 109 103 102 93*  CO2 16* 18* 22 25 25   GLUCOSE 90 74 96 83 147*  BUN 77* 81* 56* 38* 36*  CREATININE 7.71* 8.52* 6.19* 5.05* 5.48*  CALCIUM 7.7* 7.6* 7.0* 7.5* 7.3*  PHOS  --   --   --  5.5* 5.7*   GFR: CrCl cannot be calculated (Unknown ideal weight.).  Liver Function Tests: Recent Labs  Lab 03/05/23 1146 03/06/23 0609 03/08/23 0835 03/10/23 1250  AST 23 28  --   --   ALT 15 12  --   --   ALKPHOS 47 46  --   --   BILITOT 0.7 1.0  --   --   PROT 6.9 6.5  --   --   ALBUMIN 2.6* 2.5* 2.1* 2.2*   No results for input(s): "LIPASE", "AMYLASE" in the last 168 hours. No results for input(s): "AMMONIA" in the last 168 hours.  CBC: Recent Labs  Lab 03/05/23 1146 03/06/23 0609 03/07/23 0505 03/08/23 0835 03/10/23 1250  WBC 21.0* 19.5* 17.6* 14.6* 13.6*  NEUTROABS 16.0*  --   --   --   --   HGB 12.3 11.7*  12.0 11.4* 10.9*  HCT 40.1 38.8 39.0 37.3 36.5  MCV 85.0 85.5 82.5 83.4 85.7  PLT 690* 652* 595* 520* 420*     HbA1C: Hgb A1c MFr Bld  Date/Time Value Ref Range Status  10/13/2021 05:41 AM 6.4 (H) 4.8 - 5.6 % Final    Comment:    (NOTE) Pre diabetes:          5.7%-6.4%  Diabetes:              >6.4%  Glycemic control for   <7.0% adults with diabetes   10/13/2021 05:41 AM 6.4 (H) 4.8 - 5.6 % Final    Comment:    (NOTE) Pre diabetes:          5.7%-6.4%  Diabetes:              >6.4%  Glycemic control for   <7.0% adults with diabetes     Urinalysis: No results for input(s): "COLORURINE", "LABSPEC", "PHURINE", "GLUCOSEU", "HGBUR", "BILIRUBINUR", "KETONESUR", "PROTEINUR", "UROBILINOGEN", "NITRITE", "LEUKOCYTESUR" in the last 72 hours.  Invalid input(s): "APPERANCEUR"    Imaging: No results found.   Medications:  anticoagulant sodium citrate      amLODipine  10 mg Oral Daily   aspirin EC  81 mg Oral Daily   Chlorhexidine Gluconate Cloth  6 each Topical Q0600   cholecalciferol  1,000 Units Oral Daily   cloNIDine  0.1 mg Oral BID   feeding supplement (NEPRO CARB STEADY)  237 mL Oral BID BM   furosemide  80 mg Oral BID   gabapentin  100 mg Oral BID WC   gabapentin  200 mg Oral QHS   heparin  5,000 Units Subcutaneous Q8H   hydroxyurea  300 mg Oral Daily   latanoprost  1 drop Both Eyes QHS   levothyroxine  88 mcg Oral Daily   loratadine  10 mg Oral Daily   metoprolol tartrate  12.5 mg Oral BID   multivitamin  1 tablet Oral QHS   pantoprazole  40 mg Oral Daily   polyethylene glycol  17 g Oral Daily   rOPINIRole  0.5 mg Oral QHS   [START ON 03/12/2023] rosuvastatin  10 mg Oral Daily   timolol  1 drop Both Eyes q morning    Assessment/ Plan:     89 year old female with history of hypertension, coronary artery disease, congestive heart failure, diabetes, peripheral vascular disease, chronic kidney disease stage V, hyperlipidemia now admitted for shortness of  breath and acute pulmonary edema.  She is found to be in end-stage renal disease and was started on dialysis.  Principal Problem:   Acute respiratory failure with hypoxia (HCC) Active Problems:   Hypertension   Diabetes mellitus, type II (HCC)   Restless legs syndrome   Acquired hypothyroidism   CAD (coronary artery disease), native coronary artery   Peripheral neuropathy   Vitamin D deficiency   Acute renal failure superimposed on stage 4 chronic kidney disease (HCC)   Pneumonia   ESRD (end stage renal disease) (HCC)   Hyperkalemia   Renal failure  #1: End-stage renal disease: Patient with 2 dialysis treatments via permacath so far.  Will schedule her next dialysis on Tuesday.  Patient for outpatient dialysis placement.  #2: Anemia: Anemia secondary to chronic kidney disease.  Continue iron supplementation.  #3: Hypertension: Will continue the amlodipine, clonidine, furosemide and metoprolol.  Patient is advised on importance of 2 g salt restricted diet.  #4: Second hyperparathyroidism: We will check PTH, calcium and phosphorus levels.  Will monitor closely.    LOS: 6 Lorain Childes, MD [facility] 6/16/20247:53 PM

## 2023-03-11 NOTE — TOC Progression Note (Signed)
Transition of Care [facility]) - Progression Note    Patient Details  Name: Cassandra [name] MRN: [id number] Date of Birth: [birth year]  Transition of Care [facility]) CM/SW Contact  Liliana Cline, LCSW Phone Number: 03/11/2023, 9:31 AM  Clinical Narrative:    Awaiting bed offers.   Expected Discharge Plan: Home w Home Health Services    Expected Discharge Plan and Services                         DME Arranged:  (May need home oxygen)   Date DME Agency Contacted: 03/09/23 (sent referral awaiting response) Time DME Agency Contacted: 1557 Representative spoke with at DME Agency: Mayra Reel of [facility] HH Arranged: PT, OT [facility] Agency: [facility] Date [facility] Agency Contacted: 03/09/23 Time HH Agency Contacted: 1559 Representative spoke with at [facility] Agency: Amy Hyatt   Social Determinants of Health (SDOH) Interventions SDOH Screenings   Food Insecurity: No Food Insecurity (03/06/2023)  Housing: Low Risk  (03/06/2023)  Transportation Needs: No Transportation Needs (03/06/2023)  Utilities: Not At Risk (03/06/2023)  Tobacco Use: Medium Risk (03/09/2023)    Readmission Risk Interventions    03/09/2023    4:09 PM  Readmission Risk Prevention Plan  Transportation Screening Complete  PCP or Specialist Appt within 3-5 Days Complete  HRI or Home Care Consult Complete  Social Work Consult for Recovery Care Planning/Counseling Complete  Palliative Care Screening Not Applicable  Medication Review Oceanographer) Complete

## 2023-03-11 NOTE — Progress Notes (Signed)
PROGRESS NOTE    Cassandra [name]   ZOX:[id number] DOB: [birth year]  DOA: 03/05/2023 Date of Service: 03/11/23 PCP: Danella Penton, MD     Brief Narrative / Hospital Course:  Cassandra [name] is a 89 y.o. female with medical history significant of coronary artery disease, type 2 diabetes, stage IV CKD, hyperlipidemia, hypertension, hypothyroidism presenting with acute respiratory failure with hypoxia, volume overload, acute on chronic stage IV CKD, pneumonia.  Patient reports progressive increased work of breathing over the past 2 to 3 days.  06/10: ER afebrile, hemodynamically stable, satting in the 60s room air, transition from nonrebreather to BiPAP at 60% FiO2. Given 80 mg IV Lasix. As well as DuoNebs. White count 21, hemoglobin 12, platelets 690, COVID-negative, VBG grossly stable, creatinine 7.7, potassium 5.8. Bicarb 16. BNP of 792. Troponin 48. Chest x-ray with borderline cardiomegaly and interstitial edema as well as hazy by basilar airspace opacities concerning for pneumonia. Vascular surgery placed temporary cath at the bedside. Resp PCR neg. CT chest adenopathy, mild interstitial edema and mild effusion, will need outpatient f/u.  06/11: hemodialysis initiated today. Requiring HFNC O2 06/12: continue HD, plan permcath placement tomorrow.  06/13: weaning on O2 down to 5L/min. Permcath placement  06/14: confirmed outpatient HD setup for Tuesday - plan HD tomorrow, hopefully can reduce O2 requirement, she is still needing 5L/min. PT/OT recs for SNF 06/15 (Sat): HD today, placement pending, O2 down to 2L  06/16: placement pending   Consultants:  Vascular surgery Nephrology   Procedures: 06/10: R femoral temp cath placement for HD - removed 06/13 06/13: R IJ perm cath placement      ASSESSMENT & PLAN:   Principal Problem:   Acute respiratory failure with hypoxia (HCC) Active Problems:   Pneumonia   Acute renal failure superimposed on stage 4 chronic kidney disease (HCC)    Diabetes mellitus, type II (HCC)   Hypertension   CAD (coronary artery disease), native coronary artery   Acquired hypothyroidism   ESRD (end stage renal disease) (HCC)   Hyperkalemia   Acute respiratory failure with hypoxia (HCC) d/t fluid overload in setting of renal failure, also complicated by potential pneumonia noted on CXR and supported by leukocytosis and elevated procalcitonin - improving  O2 support noninvasive, pt is DNR  Treat underlying causes as below  Follow respiratory status with diuresis, hemodialysis and pneumonia treatment - improving   ESRD Hemodialysis initiated Renal navigator confirmed TTS outpatient dialysis    Questionable Community Acquired Pneumonia - completed tx  Urine Legionella, urine strep, BCx, Expanded respiratory panel negative for viral illness. CT chest to better correlate --> no lobular infiltrate   White count trending down - monitor CBC s/p azithromycin to cover pneumonia x5 days   Acquired hypothyroidism Continue Synthroid   CAD (coronary artery disease), native coronary artery No active chest pain Troponin negative x 1 EKG stable Follow ASA, statin, BB  Diabetes mellitus, type II (HCC) Monitor Glc   Hypertension BP elevated Amlodipine, clonidine restarted Continue Lasix  Hyperkalemia - improved  K 5.8 in setting of acute on chronic stage 4 CKD- requiring HD  EKG stable  Monitor closely  Abn CT chest  Mild mediastinal adenopathy, RIGHT hilar adenopathy and RIGHT lower lobe atelectasis. Small RIGHT effusion. Differential include RIGHT hilar malignancy versus benign effusion and atelectasis versus pneumonia. Mild interstitial edema pattern. Recommend clinical correlation and follow-up contrast CT in 4 to 6 weeks.   GERD w/ Hx GI bleed  Protonix   HLD Crestor   Anxiety Alprazolam  prn   Hx CVA ASA, statin   Neuropathy and RLS Gabapentin     DVT prophylaxis: heparin  Pertinent IV fluids/nutrition: no  continuous IV fluids  Central lines / invasive devices: R IJ Permcath   Code Status: DNR ACP documentation reviewed: 03/06/23 none on file w/ [facility]   Current Admission Status: inpatient   TOC needs / Dispo plan: recs for SNF  Barriers to discharge / significant pending items: placement SNF hopefully can go tomorrow / Tues               Subjective / Brief ROS:  Patient resting comfortably, no concerns   Family Communication: none at this time  TOC has been in contact w/ husband     Objective Findings:  Vitals:   03/10/23 1620 03/10/23 2207 03/11/23 0630 03/11/23 0817  BP:  (!) 140/68 (!) 146/70 122/66  Pulse:  73 70 95  Resp:  20 20 20   Temp:  98 F (36.7 C) 98 F (36.7 C) 98.7 F (37.1 C)  TempSrc:  Oral Oral   SpO2:  96% 92% 98%  Weight: 60.3 kg       Intake/Output Summary (Last 24 hours) at 03/11/2023 1504 Last data filed at 03/11/2023 1014 Gross per 24 hour  Intake 720 ml  Output 1650 ml  Net -930 ml   Filed Weights   03/08/23 0817 03/10/23 1240 03/10/23 1620  Weight: 61 kg 61 kg 60.3 kg    Examination:  Physical Exam Constitutional:      General: She is not in acute distress.    Appearance: She is well-developed.  Cardiovascular:     Rate and Rhythm: Normal rate and regular rhythm.  Pulmonary:     Effort: Pulmonary effort is normal.     Breath sounds: Normal breath sounds.  Musculoskeletal:     Right lower leg: No edema.     Left lower leg: No edema.  Skin:    General: Skin is warm and dry.          Scheduled Medications:   amLODipine  10 mg Oral Daily   aspirin EC  81 mg Oral Daily   Chlorhexidine Gluconate Cloth  6 each Topical Q0600   cholecalciferol  1,000 Units Oral Daily   cloNIDine  0.1 mg Oral BID   feeding supplement (NEPRO CARB STEADY)  237 mL Oral BID BM   furosemide  80 mg Oral BID   gabapentin  100 mg Oral BID WC   gabapentin  200 mg Oral QHS   heparin  5,000 Units Subcutaneous Q8H   hydroxyurea  300 mg Oral  Daily   latanoprost  1 drop Both Eyes QHS   levothyroxine  88 mcg Oral Daily   loratadine  10 mg Oral Daily   metoprolol tartrate  12.5 mg Oral BID   multivitamin  1 tablet Oral QHS   pantoprazole  40 mg Oral Daily   polyethylene glycol  17 g Oral Daily   rOPINIRole  0.5 mg Oral QHS   [START ON 03/12/2023] rosuvastatin  10 mg Oral Daily   timolol  1 drop Both Eyes q morning    Continuous Infusions:  anticoagulant sodium citrate      PRN Medications:  acetaminophen, albuterol, ALPRAZolam, alteplase, anticoagulant sodium citrate, heparin, lidocaine (PF), lidocaine-prilocaine, ondansetron **OR** ondansetron (ZOFRAN) IV, pentafluoroprop-tetrafluoroeth  Antimicrobials from admission:  Anti-infectives (From admission, onward)    Start     Dose/Rate Route Frequency Ordered Stop   03/08/23 0803  ceFAZolin (  ANCEF) IVPB 1 g/50 mL premix  Status:  Discontinued        1 g 100 mL/hr over 30 Minutes Intravenous 30 min pre-op 03/08/23 0803 03/08/23 1706   03/06/23 1500  azithromycin (ZITHROMAX) 500 mg in sodium chloride 0.9 % 250 mL IVPB        500 mg 250 mL/hr over 60 Minutes Intravenous Every 24 hours 03/05/23 1516 03/10/23 2022   03/06/23 1200  cefTRIAXone (ROCEPHIN) 2 g in sodium chloride 0.9 % 100 mL IVPB        2 g 200 mL/hr over 30 Minutes Intravenous Every 24 hours 03/05/23 1516 03/09/23 1333   03/05/23 1345  cefTRIAXone (ROCEPHIN) 2 g in sodium chloride 0.9 % 100 mL IVPB        2 g 200 mL/hr over 30 Minutes Intravenous  Once 03/05/23 1330 03/05/23 1432   03/05/23 1345  azithromycin (ZITHROMAX) 500 mg in sodium chloride 0.9 % 250 mL IVPB        500 mg 250 mL/hr over 60 Minutes Intravenous  Once 03/05/23 1330 03/05/23 1703           Data Reviewed:  I have personally reviewed the following...  CBC: Recent Labs  Lab 03/05/23 1146 03/06/23 0609 03/07/23 0505 03/08/23 0835 03/10/23 1250  WBC 21.0* 19.5* 17.6* 14.6* 13.6*  NEUTROABS 16.0*  --   --   --   --   HGB 12.3  11.7* 12.0 11.4* 10.9*  HCT 40.1 38.8 39.0 37.3 36.5  MCV 85.0 85.5 82.5 83.4 85.7  PLT 690* 652* 595* 520* 420*   Basic Metabolic Panel: Recent Labs  Lab 03/05/23 1146 03/06/23 0609 03/07/23 0505 03/08/23 0835 03/10/23 1250  NA 137 139 138 139 131*  K 5.8* 6.2* 4.2 3.9 3.5  CL 109 109 103 102 93*  CO2 16* 18* 22 25 25   GLUCOSE 90 74 96 83 147*  BUN 77* 81* 56* 38* 36*  CREATININE 7.71* 8.52* 6.19* 5.05* 5.48*  CALCIUM 7.7* 7.6* 7.0* 7.5* 7.3*  PHOS  --   --   --  5.5* 5.7*   GFR: CrCl cannot be calculated (Unknown ideal weight.). Liver Function Tests: Recent Labs  Lab 03/05/23 1146 03/06/23 0609 03/08/23 0835 03/10/23 1250  AST 23 28  --   --   ALT 15 12  --   --   ALKPHOS 47 46  --   --   BILITOT 0.7 1.0  --   --   PROT 6.9 6.5  --   --   ALBUMIN 2.6* 2.5* 2.1* 2.2*   No results for input(s): "LIPASE", "AMYLASE" in the last 168 hours. No results for input(s): "AMMONIA" in the last 168 hours. Coagulation Profile: No results for input(s): "INR", "PROTIME" in the last 168 hours. Cardiac Enzymes: No results for input(s): "CKTOTAL", "CKMB", "CKMBINDEX", "TROPONINI" in the last 168 hours. BNP (last 3 results) No results for input(s): "PROBNP" in the last 8760 hours. HbA1C: No results for input(s): "HGBA1C" in the last 72 hours. CBG: Recent Labs  Lab 03/06/23 1802 03/06/23 1857 03/06/23 2219  GLUCAP 67* 90 119*   Lipid Profile: No results for input(s): "CHOL", "HDL", "LDLCALC", "TRIG", "CHOLHDL", "LDLDIRECT" in the last 72 hours. Thyroid Function Tests: No results for input(s): "TSH", "T4TOTAL", "FREET4", "T3FREE", "THYROIDAB" in the last 72 hours. Anemia Panel: No results for input(s): "VITAMINB12", "FOLATE", "FERRITIN", "TIBC", "IRON", "RETICCTPCT" in the last 72 hours. Most Recent Urinalysis On File:     Component Value Date/Time  COLORURINE STRAW (A) 12/12/2021 1702   APPEARANCEUR CLEAR (A) 12/12/2021 1702   LABSPEC 1.005 12/12/2021 1702   PHURINE  7.0 12/12/2021 1702   GLUCOSEU 50 (A) 12/12/2021 1702   HGBUR MODERATE (A) 12/12/2021 1702   BILIRUBINUR NEGATIVE 12/12/2021 1702   KETONESUR NEGATIVE 12/12/2021 1702   PROTEINUR 100 (A) 12/12/2021 1702   NITRITE NEGATIVE 12/12/2021 1702   LEUKOCYTESUR SMALL (A) 12/12/2021 1702   Sepsis Labs: @LABRCNTIP (procalcitonin:4,lacticidven:4) Microbiology: Recent Results (from the past 240 hour(s))  Blood culture (single)     Status: None   Collection Time: 03/05/23 11:45 AM   Specimen: BLOOD  Result Value Ref Range Status   Specimen Description BLOOD BLOOD LEFT HAND  Final   Special Requests   Final    BOTTLES DRAWN AEROBIC AND ANAEROBIC Blood Culture adequate volume   Culture   Final    NO GROWTH 5 DAYS Performed at [facility], [address], [place], [place] [address]    Report Status 03/10/2023 FINAL  Final  SARS Coronavirus 2 by RT PCR (hospital order, performed in [facility] hospital lab) *cepheid single result test* Anterior Nasal Swab     Status: None   Collection Time: 03/05/23 12:02 PM   Specimen: Anterior Nasal Swab  Result Value Ref Range Status   SARS Coronavirus 2 by RT PCR NEGATIVE NEGATIVE Final    Comment: (NOTE) SARS-CoV-2 target nucleic acids are NOT DETECTED.  The SARS-CoV-2 RNA is generally detectable in upper and lower respiratory specimens during the acute phase of infection. The lowest concentration of SARS-CoV-2 viral copies this assay can detect is 250 copies / mL. A negative result does not preclude SARS-CoV-2 infection and should not be used as the sole basis for treatment or other patient management decisions.  A negative result may occur with improper specimen collection / handling, submission of specimen other than nasopharyngeal swab, presence of viral mutation(s) within the areas targeted by this assay, and inadequate number of viral copies (<250 copies / mL). A negative result must be combined with clinical observations, patient  history, and epidemiological information.  Fact Sheet for Patients:   RoadLapTop.co.za  Fact Sheet for Healthcare Providers: http://kim-miller.com/  This test is not yet approved or  cleared by the [place] FDA and has been authorized for detection and/or diagnosis of SARS-CoV-2 by FDA under an Emergency Use Authorization (EUA).  This EUA will remain in effect (meaning this test can be used) for the duration of the COVID-19 declaration under Section 564(b)(1) of the Act, 21 U.S.C. section 360bbb-3(b)(1), unless the authorization is terminated or revoked sooner.  Performed at [facility], [address] [place], [place] [address]   Respiratory (~20 pathogens) panel by PCR     Status: None   Collection Time: 03/05/23  4:00 PM   Specimen: Nasopharyngeal Swab; Respiratory  Result Value Ref Range Status   Adenovirus NOT DETECTED NOT DETECTED Final   Coronavirus 229E NOT DETECTED NOT DETECTED Final    Comment: (NOTE) The Coronavirus on the Respiratory Panel, DOES NOT test for the novel  Coronavirus (2019 nCoV)    Coronavirus HKU1 NOT DETECTED NOT DETECTED Final   Coronavirus NL63 NOT DETECTED NOT DETECTED Final   Coronavirus OC43 NOT DETECTED NOT DETECTED Final   Metapneumovirus NOT DETECTED NOT DETECTED Final   Rhinovirus / Enterovirus NOT DETECTED NOT DETECTED Final   Influenza A NOT DETECTED NOT DETECTED Final   Influenza B NOT DETECTED NOT DETECTED Final   Parainfluenza Virus 1 NOT DETECTED  NOT DETECTED Final   Parainfluenza Virus 2 NOT DETECTED NOT DETECTED Final   Parainfluenza Virus 3 NOT DETECTED NOT DETECTED Final   Parainfluenza Virus 4 NOT DETECTED NOT DETECTED Final   Respiratory Syncytial Virus NOT DETECTED NOT DETECTED Final   Bordetella pertussis NOT DETECTED NOT DETECTED Final   Bordetella Parapertussis NOT DETECTED NOT DETECTED Final   Chlamydophila pneumoniae NOT DETECTED NOT DETECTED Final    Mycoplasma pneumoniae NOT DETECTED NOT DETECTED Final    Comment: Performed at [facility], [address] [place], [place] [address]      Radiology Studies last 3 days: PERIPHERAL VASCULAR CATHETERIZATION  Result Date: 03/08/2023 See surgical note for result.            LOS: 6 days     [name], DO [facility] 03/11/2023, 3:04 PM    Dictation software may have been used to generate the above note. Typos may occur and escape review in typed/dictated notes. Please contact Dr Lyn Hollingshead directly for clarity if needed.  Staff may message me via secure chat in Epic  but this may not receive an immediate response,  please page me for urgent matters!  If 7PM-7AM, please contact night coverage [email or web address]

## 2023-03-12 DIAGNOSIS — J9601 Acute respiratory failure with hypoxia: Secondary | ICD-10-CM | POA: Diagnosis not present

## 2023-03-12 MED ORDER — ACETAMINOPHEN 325 MG PO TABS: 650.0000 mg | ORAL_TABLET | Freq: Four times a day (QID) | ORAL | Status: DC | PRN

## 2023-03-12 MED ORDER — HYDROCODONE-ACETAMINOPHEN 5-325 MG PO TABS: 0.5000 | ORAL_TABLET | Freq: Two times a day (BID) | ORAL | 0 refills | Status: DC | PRN

## 2023-03-12 MED ORDER — FUROSEMIDE 80 MG PO TABS: 80.0000 mg | ORAL_TABLET | Freq: Two times a day (BID) | ORAL | 0 refills | Status: DC

## 2023-03-12 MED ORDER — GABAPENTIN 100 MG PO CAPS: 100.0000 mg | ORAL_CAPSULE | Freq: Two times a day (BID) | ORAL | 0 refills | Status: DC

## 2023-03-12 MED ORDER — GABAPENTIN 100 MG PO CAPS: 200.0000 mg | ORAL_CAPSULE | Freq: Every day | ORAL | 0 refills | Status: DC

## 2023-03-12 MED ORDER — ALPRAZOLAM 0.25 MG PO TABS: 0.2500 mg | ORAL_TABLET | Freq: Every day | ORAL | 0 refills | Status: DC | PRN

## 2023-03-12 MED ORDER — PANTOPRAZOLE SODIUM 40 MG PO TBEC: 40.0000 mg | DELAYED_RELEASE_TABLET | Freq: Every day | ORAL | 2 refills | Status: DC

## 2023-03-12 MED ORDER — RENA-VITE PO TABS: 1.0000 | ORAL_TABLET | Freq: Every day | ORAL | 0 refills | Status: DC

## 2023-03-12 MED ORDER — NEPRO/CARBSTEADY PO LIQD: 237.0000 mL | Freq: Two times a day (BID) | ORAL | 0 refills | Status: AC

## 2023-03-12 MED ORDER — METOPROLOL TARTRATE 25 MG PO TABS: 12.5000 mg | ORAL_TABLET | Freq: Two times a day (BID) | ORAL | 0 refills | Status: DC

## 2023-03-12 NOTE — Progress Notes (Signed)
[facility] [place], [place] 03/12/23  Subjective:   Hospital day # 7 Patient known to our practice from outpatient follow-up for CKD.   Patient seen sitting up in chair Completed breakfast tray at bedside  Complains of right chest and neck soreness from permcath.   Agreeable to discharge to rehab   Renal: 06/16 0701 - 06/17 0700 In: 480 [P.O.:480] Out: 650 [Urine:650] Lab Results  Component Value Date   CREATININE 5.48 (H) 03/10/2023   CREATININE 5.05 (H) 03/08/2023   CREATININE 6.19 (H) 03/07/2023     Objective:  Vital signs in last 24 hours:  Temp:  [97.6 F (36.4 C)-98.4 F (36.9 C)] 97.9 F (36.6 C) (06/17 0824) Pulse Rate:  [60-67] 60 (06/17 0824) Resp:  [16-18] 18 (06/17 0824) BP: (113-145)/(54-86) 121/86 (06/17 0824) SpO2:  [92 %-94 %] 92 % (06/17 0824)  Weight change:  Filed Weights   03/08/23 0817 03/10/23 1240 03/10/23 1620  Weight: 61 kg 61 kg 60.3 kg    Intake/Output:    Intake/Output Summary (Last 24 hours) at 03/12/2023 1359 Last data filed at 03/12/2023 1610 Gross per 24 hour  Intake 240 ml  Output 550 ml  Net -310 ml      Physical Exam: General: NAD  HEENT Normocephalic   Pulm/lungs Diminished in bases., HFNC  CVS/Heart regular rate and rhythm   Abdomen:  Soft, nontender, nondistended  Extremities: Dependent edema present  Neurologic: Alert, able to answer questions  Skin: No acute rashes  Access: Rt chest PermCath       Basic Metabolic Panel:  Recent Labs  Lab 03/06/23 0609 03/07/23 0505 03/08/23 0835 03/10/23 1250  NA 139 138 139 131*  K 6.2* 4.2 3.9 3.5  CL 109 103 102 93*  CO2 18* 22 25 25   GLUCOSE 74 96 83 147*  BUN 81* 56* 38* 36*  CREATININE 8.52* 6.19* 5.05* 5.48*  CALCIUM 7.6* 7.0* 7.5* 7.3*  PHOS  --   --  5.5* 5.7*      CBC: Recent Labs  Lab 03/06/23 0609 03/07/23 0505 03/08/23 0835 03/10/23 1250  WBC 19.5* 17.6* 14.6* 13.6*  HGB 11.7* 12.0 11.4* 10.9*  HCT 38.8 39.0 37.3  36.5  MCV 85.5 82.5 83.4 85.7  PLT 652* 595* 520* 420*       Lab Results  Component Value Date   HEPBSAG NON REACTIVE 03/05/2023      Microbiology:  Recent Results (from the past 240 hour(s))  Blood culture (single)     Status: None   Collection Time: 03/05/23 11:45 AM   Specimen: BLOOD  Result Value Ref Range Status   Specimen Description BLOOD BLOOD LEFT HAND  Final   Special Requests   Final    BOTTLES DRAWN AEROBIC AND ANAEROBIC Blood Culture adequate volume   Culture   Final    NO GROWTH 5 DAYS Performed at [facility], [address], [place], [place] [address]    Report Status 03/10/2023 FINAL  Final  SARS Coronavirus 2 by RT PCR (hospital order, performed in [facility] hospital lab) *cepheid single result test* Anterior Nasal Swab     Status: None   Collection Time: 03/05/23 12:02 PM   Specimen: Anterior Nasal Swab  Result Value Ref Range Status   SARS Coronavirus 2 by RT PCR NEGATIVE NEGATIVE Final    Comment: (NOTE) SARS-CoV-2 target nucleic acids are NOT DETECTED.  The SARS-CoV-2 RNA is generally detectable in upper and lower respiratory specimens during the acute phase of  infection. The lowest concentration of SARS-CoV-2 viral copies this assay can detect is 250 copies / mL. A negative result does not preclude SARS-CoV-2 infection and should not be used as the sole basis for treatment or other patient management decisions.  A negative result may occur with improper specimen collection / handling, submission of specimen other than nasopharyngeal swab, presence of viral mutation(s) within the areas targeted by this assay, and inadequate number of viral copies (<250 copies / mL). A negative result must be combined with clinical observations, patient history, and epidemiological information.  Fact Sheet for Patients:   RoadLapTop.co.za  Fact Sheet for Healthcare  Providers: http://kim-miller.com/  This test is not yet approved or  cleared by the [place] FDA and has been authorized for detection and/or diagnosis of SARS-CoV-2 by FDA under an Emergency Use Authorization (EUA).  This EUA will remain in effect (meaning this test can be used) for the duration of the COVID-19 declaration under Section 564(b)(1) of the Act, 21 U.S.C. section 360bbb-3(b)(1), unless the authorization is terminated or revoked sooner.  Performed at [facility], [address] [place], [place] [address]   Respiratory (~20 pathogens) panel by PCR     Status: None   Collection Time: 03/05/23  4:00 PM   Specimen: Nasopharyngeal Swab; Respiratory  Result Value Ref Range Status   Adenovirus NOT DETECTED NOT DETECTED Final   Coronavirus 229E NOT DETECTED NOT DETECTED Final    Comment: (NOTE) The Coronavirus on the Respiratory Panel, DOES NOT test for the novel  Coronavirus (2019 nCoV)    Coronavirus HKU1 NOT DETECTED NOT DETECTED Final   Coronavirus NL63 NOT DETECTED NOT DETECTED Final   Coronavirus OC43 NOT DETECTED NOT DETECTED Final   Metapneumovirus NOT DETECTED NOT DETECTED Final   Rhinovirus / Enterovirus NOT DETECTED NOT DETECTED Final   Influenza A NOT DETECTED NOT DETECTED Final   Influenza B NOT DETECTED NOT DETECTED Final   Parainfluenza Virus 1 NOT DETECTED NOT DETECTED Final   Parainfluenza Virus 2 NOT DETECTED NOT DETECTED Final   Parainfluenza Virus 3 NOT DETECTED NOT DETECTED Final   Parainfluenza Virus 4 NOT DETECTED NOT DETECTED Final   Respiratory Syncytial Virus NOT DETECTED NOT DETECTED Final   Bordetella pertussis NOT DETECTED NOT DETECTED Final   Bordetella Parapertussis NOT DETECTED NOT DETECTED Final   Chlamydophila pneumoniae NOT DETECTED NOT DETECTED Final   Mycoplasma pneumoniae NOT DETECTED NOT DETECTED Final    Comment: Performed at [facility], [address] [place], [place] [address]     Coagulation Studies: No results for input(s): "LABPROT", "INR" in the last 72 hours.  Urinalysis: No results for input(s): "COLORURINE", "LABSPEC", "PHURINE", "GLUCOSEU", "HGBUR", "BILIRUBINUR", "KETONESUR", "PROTEINUR", "UROBILINOGEN", "NITRITE", "LEUKOCYTESUR" in the last 72 hours.  Invalid input(s): "APPERANCEUR"    Imaging: No results found.   Medications:    anticoagulant sodium citrate      amLODipine  10 mg Oral Daily   aspirin EC  81 mg Oral Daily   Chlorhexidine Gluconate Cloth  6 each Topical Q0600   cholecalciferol  1,000 Units Oral Daily   cloNIDine  0.1 mg Oral BID   feeding supplement (NEPRO CARB STEADY)  237 mL Oral BID BM   furosemide  80 mg Oral BID   gabapentin  100 mg Oral BID WC   gabapentin  200 mg Oral QHS   heparin  5,000 Units Subcutaneous Q8H   hydroxyurea  300 mg Oral Daily   latanoprost  1 drop Both  Eyes QHS   levothyroxine  88 mcg Oral Daily   loratadine  10 mg Oral Daily   metoprolol tartrate  12.5 mg Oral BID   multivitamin  1 tablet Oral QHS   pantoprazole  40 mg Oral Daily   polyethylene glycol  17 g Oral Daily   rOPINIRole  0.5 mg Oral QHS   rosuvastatin  10 mg Oral Daily   timolol  1 drop Both Eyes q morning   acetaminophen, albuterol, ALPRAZolam, alteplase, anticoagulant sodium citrate, heparin, lidocaine (PF), lidocaine-prilocaine, ondansetron **OR** ondansetron (ZOFRAN) IV, pentafluoroprop-tetrafluoroeth  Assessment/ Plan:  89 y.o. female with coronary artery disease, diabetes type 2, chronic kidney disease, hyperlipidemia, hypertension, hypothyroidism    admitted on 03/05/2023 for Acute pulmonary edema (HCC) [J81.0] Renal failure [N19] ESRD (end stage renal disease) (HCC) [N18.6] Acute respiratory failure with hypoxia (HCC) [J96.01] Community acquired pneumonia, unspecified laterality [J18.9]  End-stage renal disease Patient has advanced chronic kidney disease.  Her baseline creatinine is 5.96/GFR 6 from April 2024. She now  comes in with volume overload, hyperkalemia and metabolic acidosis.    Next treatment scheduled for Tuesday  Renal navigator has confirmed outpatient dialysis clinic at [facility] on a TTS schedule.  Acute metabolic acidosis Corrected  Volume overload/acute respiratory failure BiPAP required on admission.  Currently on 2L HFNC IV furosemide 80mg  BID  Hyperkalemia Corrected    LOS: 7 [employer] 6/17/20241:59 PM  [facility] [place], [place] [phone]

## 2023-03-12 NOTE — TOC Progression Note (Addendum)
Transition of Care [facility]) - Progression Note    Patient Details  Name: Cassandra [name] MRN: [id number] Date of Birth: 1926/10/09  Transition [facility]) CM/SW Contact  Kreg Shropshire, RN Phone Number: 03/12/2023, 10:36 AM  Clinical Narrative:     Spoke with patient and husband regarding bed search. They both agreed ton [facility]. Per Marchelle Folks dialysis coordination, pt will have to see a different Nephrologist temporary. Cm informed husband. Cm sent message to [facility] admissions coordinator at [facility] to verify  Expected Discharge Plan: Skilled Nursing Facility    Expected Discharge Plan and Services                         DME Arranged:  (May need home oxygen)   Date DME Agency Contacted: 03/09/23 (sent referral awaiting response) Time DME Agency Contacted: 1557 Representative spoke with at DME Agency: Mayra Reel of [facility] HH Arranged: PT, OT [facility] Agency: [facility] Date [facility] Agency Contacted: 03/09/23 Time HH Agency Contacted: 1559 Representative spoke with at [facility] Agency: Amy Hyatt   Social Determinants of Health (SDOH) Interventions SDOH Screenings   Food Insecurity: No Food Insecurity (03/06/2023)  Housing: Low Risk  (03/06/2023)  Transportation Needs: No Transportation Needs (03/06/2023)  Utilities: Not At Risk (03/06/2023)  Tobacco Use: Medium Risk (03/09/2023)    Readmission Risk Interventions    03/09/2023    4:09 PM  Readmission Risk Prevention Plan  Transportation Screening Complete  PCP or Specialist Appt within 3-5 Days Complete  HRI or Home Care Consult Complete  Social Work Consult for Recovery Care Planning/Counseling Complete  Palliative Care Screening Not Applicable  Medication Review Ocean[employer]) Complete

## 2023-03-12 NOTE — TOC Progression Note (Addendum)
Transition of Care [facility]) - Progression Note    Patient Details  Name: Cassandra [name] MRN: [id number] Date of Birth: 10/21/1926  Transition of Care [facility]) CM/SW Contact  Kreg Shropshire, RN Phone Number: 03/12/2023, 9:26 AM  Clinical Narrative:     Pt offered a bed at [facility] Sent secure messages to admissions coordinators for review of [employer], [address], [employer], [facility], and [facility] due to them being pending over the weekend. Will update pt and family on options  Expected Discharge Plan: Home w Home Health Services    Expected Discharge Plan and Services                         DME Arranged:  (May need home oxygen)   Date DME Agency Contacted: 03/09/23 (sent referral awaiting response) Time DME Agency Contacted: 1557 Representative spoke with at DME Agency: Mayra Reel of [facility] HH Arranged: PT, OT [facility] Agency: [facility] Date [facility] Agency Contacted: 03/09/23 Time HH Agency Contacted: 1559 Representative spoke with at [facility] Agency: Amy Hyatt   Social Determinants of Health (SDOH) Interventions SDOH Screenings   Food Insecurity: No Food Insecurity (03/06/2023)  Housing: Low Risk  (03/06/2023)  Transportation Needs: No Transportation Needs (03/06/2023)  Utilities: Not At Risk (03/06/2023)  Tobacco Use: Medium Risk (03/09/2023)    Readmission Risk Interventions    03/09/2023    4:09 PM  Readmission Risk Prevention Plan  Transportation Screening Complete  PCP or Specialist Appt within 3-5 Days Complete  HRI or Home Care Consult Complete  Social Work Consult for Recovery Care Planning/Counseling Complete  Palliative Care Screening Not Applicable  Medication Review Oceanographer) Complete

## 2023-03-12 NOTE — Care Management Important Message (Signed)
Important Message  Patient Details  Name: Cassandra [name] MRN: [id number] Date of Birth: 1927/05/08   Medicare Important Message Given:  Yes  Patient asleep upon time of visit, copy of Medicare IM left in room for reference.   Johnell Comings 03/12/2023, 12:26 PM

## 2023-03-12 NOTE — Progress Notes (Signed)
Occupational Therapy Treatment Patient Details Name: Cassandra [name] MRN: [id number] DOB: 1926-11-29 Today's Date: 03/12/2023   History of present illness Pt is a 89 y.o. female presenting to hospital 03/05/23 with c/o SOB for about the past week; has been coughing and weak.  Pt admitted with acute respiratory failure with hypoxia, PNA, acute renal failure superimposed on stage 4 CKD, acquired hypothyroidism, and hyperkalemia.  R femoral temp cath placement for HD 6/10.  R IJ permcath placed 03/08/23.  PMH includes CAD, DM, stage IV CKD, HLD, htn, hypothyroidism, foot sx, L knee arthroscopy with meniscectomy.   OT comments  Pt seen for OT treatment on this date. Upon arrival to room pt just return to bed with NT after being up in recliner t/o the morning. Pt declines OOB/EOB activity, agreeable to bed-level OT Tx session. NT requesting assistance with bed mobility and to support CHG wipe down at bed level. OT facilitated ADL management as described below. See ADL section for additional details regarding occupational performance. Pt continues to be functionally limited by generalized weakness and decreased activity tolerance Pt return verbalizes understanding of education provided t/o session. Pt is progressing toward OT goals and continues to benefit from skilled OT services to maximize return to PLOF and minimize risk of future falls, injury, caregiver burden, and readmission. Will continue to follow POC as written. Discharge recommendation remains appropriate.     Recommendations for follow up therapy are one component of a multi-disciplinary discharge planning process, led by the attending physician.  Recommendations may be updated based on patient status, additional functional criteria and insurance authorization.    Assistance Recommended at Discharge Frequent or constant Supervision/Assistance  Patient can return home with the following  A little help with walking and/or transfers;A little help  with bathing/dressing/bathroom;Assistance with cooking/housework;Direct supervision/assist for medications management;Direct supervision/assist for financial management;Assist for transportation   Equipment Recommendations  Other (comment) (Defer)    Recommendations for Other Services      Precautions / Restrictions Precautions Precautions: Fall Precaution Comments: R IJ permcath Restrictions Weight Bearing Restrictions: No       Mobility Bed Mobility Overal bed mobility: Needs Assistance Bed Mobility: Rolling Rolling: Min assist         General bed mobility comments: Min a for rolling R<>L multiple times during session. MOD A to boost up in bed and re-position.    Transfers Overall transfer level: Needs assistance                 General transfer comment: deferred. Pt back to bed with NT just prior to OT arrival. Declines further EOB/OOB activity this date.     Balance Overall balance assessment: Needs assistance                                         ADL either performed or assessed with clinical judgement   ADL Overall ADL's : Needs assistance/impaired Eating/Feeding: Bed level;Set up   Grooming: Bed level;Wash/dry hands;Set up;Wash/dry face   Upper Body Bathing: Bed level;Set up Upper Body Bathing Details (indicate cue type and reason): Pt participates in CHG wipe down at bed level with OT/NT. she declines further EOB/OOB activity. Lower Body Bathing: Bed level;Moderate assistance Lower Body Bathing Details (indicate cue type and reason): Pt participates in CHG wipe down at bed level with OT/NT. she declines further EOB/OOB activity.  Extremity/Trunk Assessment Upper Extremity Assessment Upper Extremity Assessment: Generalized weakness   Lower Extremity Assessment Lower Extremity Assessment: Generalized weakness        Vision Baseline Vision/History: 1 Wears glasses Patient Visual Report:  No change from baseline     Perception     Praxis      Cognition Arousal/Alertness: Awake/alert Behavior During Therapy: WFL for tasks assessed/performed Overall Cognitive Status: Within Functional Limits for tasks assessed                                          Exercises Other Exercises Other Exercises: Pt educated on importance of functional activity during hospital stay, bed mobility techniques, and DC recs.    Shoulder Instructions       General Comments      Pertinent Vitals/ Pain       Pain Assessment Pain Assessment: No/denies pain  Home Living                                          Prior Functioning/Environment              Frequency  Min 2X/week        Progress Toward Goals  OT Goals(current goals can now be found in the care plan section)  Progress towards OT goals: Progressing toward goals  Acute Rehab OT Goals Patient Stated Goal: To get better OT Goal Formulation: With patient Time For Goal Achievement: 03/23/23 Potential to Achieve Goals: Good  Plan Discharge plan remains appropriate;Frequency remains appropriate    Co-evaluation                 AM-PAC OT "6 Clicks" Daily Activity     Outcome Measure   Help from another person eating meals?: None Help from another person taking care of personal grooming?: A Little Help from another person toileting, which includes using toliet, bedpan, or urinal?: A Little Help from another person bathing (including washing, rinsing, drying)?: A Lot Help from another person to put on and taking off regular upper body clothing?: A Little Help from another person to put on and taking off regular lower body clothing?: A Little 6 Click Score: 18    End of Session Equipment Utilized During Treatment: Oxygen  OT Visit Diagnosis: Unsteadiness on feet (R26.81);Other abnormalities of gait and mobility (R26.89);Repeated falls (R29.6)   Activity Tolerance  Patient tolerated treatment well   Patient Left in bed;with bed alarm set;with call bell/phone within reach   Nurse Communication          Time: 1400-1420 OT Time Calculation (min): 20 min  Charges: OT General Charges $OT Visit: 1 Visit OT Treatments $Self Care/Home Management : 8-22 mins  Rockney Ghee, M.S., OTR/L 03/12/23, 3:15 PM

## 2023-03-12 NOTE — Progress Notes (Signed)
Physical Therapy Treatment Patient Details Name: Cassandra [name] MRN: [id number] DOB: 11/28/1926 Today's Date: 03/12/2023   History of Present Illness Pt is a 89 y.o. female presenting to hospital 03/05/23 with c/o SOB for about the past week; has been coughing and weak.  Pt admitted with acute respiratory failure with hypoxia, PNA, acute renal failure superimposed on stage 4 CKD, acquired hypothyroidism, and hyperkalemia.  R femoral temp cath placement for HD 6/10.  R IJ permcath placed 03/08/23.  PMH includes CAD, DM, stage IV CKD, HLD, htn, hypothyroidism, foot sx, L knee arthroscopy with meniscectomy.    PT Comments    Pt resting in recliner upon PT arrival; agreeable to therapy.  Pt min assist with transfers (assist with standing balance d/t posterior lean even with use of RW) and min assist with ambulation 14 feet with RW use (assist required for balance during ambulation; limited distance ambulating d/t pt fatigue).  Pt demonstrating generalized weakness and impaired activity tolerance.  Will continue to focus on strengthening, balance, and progressive functional mobility during hospitalization.    Recommendations for follow up therapy are one component of a multi-disciplinary discharge planning process, led by the attending physician.  Recommendations may be updated based on patient status, additional functional criteria and insurance authorization.        Assistance Recommended at Discharge Frequent or constant Supervision/Assistance  Patient can return home with the following A little help with bathing/dressing/bathroom;Assistance with cooking/housework;Assist for transportation;Help with stairs or ramp for entrance;A little help with walking and/or transfers   Equipment Recommendations  Rolling walker (2 wheels)    Recommendations for Other Services       Precautions / Restrictions Precautions Precautions: Fall Precaution Comments: R IJ permcath Restrictions Weight Bearing  Restrictions: No     Mobility  Bed Mobility               General bed mobility comments: Deferred (pt in recliner beginning/end of session)    Transfers Overall transfer level: Needs assistance Equipment used: Rolling walker (2 wheels) Transfers: Sit to/from Stand Sit to Stand: Min assist           General transfer comment: x2 trials standing from recliner; vc's for UE placement; assist to steady d/t posterior lean initially when standing    Ambulation/Gait Ambulation/Gait assistance: Min assist Gait Distance (Feet): 14 Feet Assistive device: Rolling walker (2 wheels)   Gait velocity: decreased     General Gait Details: decreased B LE step length/foot clearance; assist to steady; increased effort/time to take steps; limited d/t pt fatigue   Stairs             Wheelchair Mobility    Modified Rankin (Stroke Patients Only)       Balance Overall balance assessment: Needs assistance Sitting-balance support: Feet supported, No upper extremity supported Sitting balance-Leahy Scale: Good Sitting balance - Comments: steady reaching within BOS   Standing balance support: Bilateral upper extremity supported, Reliant on assistive device for balance Standing balance-Leahy Scale: Poor Standing balance comment: posterior lean in standing and unsteady walking with RW requiring assist for balance                            Cognition Arousal/Alertness: Awake/alert Behavior During Therapy: WFL for tasks assessed/performed Overall Cognitive Status: Within Functional Limits for tasks assessed  General Comments: A&Ox4; decreased awareness of safety and impairments with mobility        Exercises      General Comments  Pt agreeable to PT session.      Pertinent Vitals/Pain Pain Assessment Pain Assessment: No/denies pain Pain Intervention(s): Limited activity within patient's tolerance, Monitored  during session, Repositioned Vitals (HR and O2 on 2 L via nasal cannula) stable and WFL throughout treatment session.    Home Living                          Prior Function            PT Goals (current goals can now be found in the care plan section) Acute Rehab PT Goals Patient Stated Goal: to improve strength and mobility PT Goal Formulation: With patient Time For Goal Achievement: 03/23/23 Potential to Achieve Goals: Good Progress towards PT goals: Progressing toward goals    Frequency    Min 4X/week      PT Plan Current plan remains appropriate    Co-evaluation              AM-PAC PT "6 Clicks" Mobility   Outcome Measure  Help needed turning from your back to your side while in a flat bed without using bedrails?: None Help needed moving from lying on your back to sitting on the side of a flat bed without using bedrails?: A Little Help needed moving to and from a bed to a chair (including a wheelchair)?: A Little Help needed standing up from a chair using your arms (e.g., wheelchair or bedside chair)?: A Little Help needed to walk in hospital room?: A Lot Help needed climbing 3-5 steps with a railing? : Total 6 Click Score: 16    End of Session Equipment Utilized During Treatment: Gait belt Activity Tolerance: Patient limited by fatigue Patient left: in chair;with call bell/phone within reach;with chair alarm set Nurse Communication: Mobility status;Precautions PT Visit Diagnosis: Unsteadiness on feet (R26.81);Other abnormalities of gait and mobility (R26.89);Muscle weakness (generalized) (M62.81)     Time: 2956-2130 PT Time Calculation (min) (ACUTE ONLY): 23 min  Charges:  $Gait Training: 8-22 mins $Therapeutic Activity: 8-22 mins                     Hendricks Limes, PT 03/12/23, 11:39 AM

## 2023-03-12 NOTE — Discharge Planning (Signed)
Rerouted patient's OPHD referral to [facility] [place], this morning, to accommodate the needed facility and schedule for SNF placement at [facility]. Currently referral is still in process awaiting MD approval.

## 2023-03-12 NOTE — Discharge Summary (Signed)
Physician Discharge Summary   Patient: Cassandra [name] MRN: [id number]  DOB: 1927-03-21   Admit:     Date of Admission: 03/05/2023 Admitted from: home   Discharge: Date of discharge: TBD Disposition: Skilled nursing facility Condition at discharge: good  CODE STATUS: DNR     Discharge Physician: [name], DO [facility]     PCP: Danella Penton, MD  Recommendations for Outpatient Follow-up:  Follow up with PCP Danella Penton, MD in 2-4 weeks Please obtain labs/tests: CBC, BMP in 2-4 weeks Follow as directed for dialysis / nephrology visits  Needs contrast CT chest in 4-6 to follow lung nodule  Please follow up on the following pending results: none     Discharge Instructions     (HEART FAILURE PATIENTS) Call MD:  Anytime you have any of the following symptoms: 1) 3 pound weight gain in 24 hours or 5 pounds in 1 week 2) shortness of breath, with or without a dry hacking cough 3) swelling in the hands, feet or stomach 4) if you have to sleep on extra pillows at night in order to breathe.   Complete by: As directed    Call MD for:  difficulty breathing, headache or visual disturbances   Complete by: As directed    Call MD for:  severe uncontrolled pain   Complete by: As directed    Diet - low sodium heart healthy   Complete by: As directed    Discharge wound care:   Complete by: As directed    Keep Permcath site clean and dry. Dressing change as needed to temp cath site until healed   Increase activity slowly   Complete by: As directed          Discharge Diagnoses: Principal Problem:   Acute respiratory failure with hypoxia (HCC) Active Problems:   Pneumonia   Acute renal failure superimposed on stage 4 chronic kidney disease (HCC)   Diabetes mellitus, type II (HCC)   Hypertension   CAD (coronary artery disease), native coronary artery   Restless legs syndrome   Acquired hypothyroidism   Peripheral neuropathy   Vitamin D deficiency    ESRD (end stage renal disease) (HCC)   Hyperkalemia   Renal failure       Hospital Course: Cassandra [name] is a 89 y.o. female with medical history significant of coronary artery disease, type 2 diabetes, stage IV CKD, hyperlipidemia, hypertension, hypothyroidism presenting with acute respiratory failure with hypoxia, volume overload, acute on chronic stage IV CKD, pneumonia.  Patient reports progressive increased work of breathing over the past 2 to 3 days.  06/10: ER afebrile, hemodynamically stable, satting in the 60s room air, transition from nonrebreather to BiPAP at 60% FiO2. Given 80 mg IV Lasix. As well as DuoNebs. White count 21, hemoglobin 12, platelets 690, COVID-negative, VBG grossly stable, creatinine 7.7, potassium 5.8. Bicarb 16. BNP of 792. Troponin 48. Chest x-ray with borderline cardiomegaly and interstitial edema as well as hazy by basilar airspace opacities concerning for pneumonia. Vascular surgery placed temporary cath at the bedside. Resp PCR neg. CT chest adenopathy, mild interstitial edema and mild effusion, will need outpatient f/u.  06/11: hemodialysis initiated today. Requiring HFNC O2 06/12: continue HD, plan permcath placement tomorrow.  06/13: weaning on O2 down to 5L/min. Permcath placement  06/14: confirmed outpatient HD setup for Tuesday - plan HD tomorrow, hopefully can reduce O2 requirement, she is still needing 5L/min. PT/OT recs for SNF 06/15 (Sat): HD today, placement pending, O2  down to 2L  06/16: placement pending  06/17: pending dialysis outpatient but medically stable   Consultants:  Vascular surgery Nephrology   Procedures: 06/10: R femoral temp cath placement for HD - removed 06/13 06/13: R IJ perm cath placement      ASSESSMENT & PLAN:   Acute respiratory failure with hypoxia (HCC) d/t fluid overload in setting of renal failure, also complicated by potential pneumonia noted on CXR and supported by leukocytosis and elevated procalcitonin  - improving  O2 support noninvasive, pt is DNR  Treat underlying causes as below  Follow respiratory status with diuresis, hemodialysis and pneumonia treatment - improving   ESRD Hemodialysis initiated Renal navigator confirmed TTS outpatient dialysis but then needed to change this d/t placement     Questionable Community Acquired Pneumonia - completed tx  Urine Legionella, urine strep, BCx, Expanded respiratory panel negative for viral illness. CT chest to better correlate --> no lobular infiltrate   White count trending down - monitor CBC s/p azithromycin to cover pneumonia x5 days   Acquired hypothyroidism Continue Synthroid   CAD (coronary artery disease), native coronary artery No active chest pain Troponin negative x 1 EKG stable Follow ASA, statin, BB  Diabetes mellitus, type II (HCC) Monitor Glc   Hypertension BP elevated Amlodipine, clonidine restarted Continue Lasix  Hyperkalemia - improved  K 5.8 in setting of acute on chronic stage 4 CKD- requiring HD  EKG stable  Monitor closely  Abn CT chest  Mild mediastinal adenopathy, RIGHT hilar adenopathy and RIGHT lower lobe atelectasis. Small RIGHT effusion. Differential include RIGHT hilar malignancy versus benign effusion and atelectasis versus pneumonia. Mild interstitial edema pattern. Recommend clinical correlation and follow-up contrast CT in 4 to 6 weeks.   GERD w/ Hx GI bleed  Protonix   HLD Crestor   Anxiety Alprazolam prn   Hx CVA ASA, statin   Neuropathy and RLS Gabapentin             Discharge Instructions  Allergies as of 03/12/2023       Reactions   Atorvastatin Other (See Comments)   MYALGIA   Cyclobenzaprine Other (See Comments)   Hydrocodone-acetaminophen    Other reaction(s): Hallucination when taking whole pill, pt tolerates taking 1/2 pill   Mirtazapine    Other reaction(s): Hallucination   Oxycodone-acetaminophen Other (See Comments)   Paroxetine Hcl Other (See  Comments)   Penicillins Swelling   Lip and orbital swelling   Propoxyphene Other (See Comments)   Trazodone Other (See Comments)        Medication List     STOP taking these medications    hydrochlorothiazide 25 MG tablet Commonly known as: HYDRODIURIL   naloxone 4 MG/0.1ML Liqd nasal spray kit Commonly known as: NARCAN   torsemide 20 MG tablet Commonly known as: DEMADEX       TAKE these medications    acetaminophen 325 MG tablet Commonly known as: TYLENOL Take 2 tablets (650 mg total) by mouth every 6 (six) hours as needed for mild pain.   albuterol 108 (90 Base) MCG/ACT inhaler Commonly known as: VENTOLIN HFA Inhale 2 puffs into the lungs every 4 (four) hours as needed.   ALPRAZolam 0.25 MG tablet Commonly known as: XANAX Take 1 tablet (0.25 mg total) by mouth daily as needed.   amLODipine 10 MG tablet Commonly known as: NORVASC Take 1 tablet by mouth daily.   aspirin EC 81 MG tablet Take 1 tablet (81 mg total) by mouth daily. Swallow whole.   azelastine  0.1 % nasal spray Commonly known as: ASTELIN Place 2 sprays into both nostrils 2 (two) times daily.   cetirizine 10 MG tablet Commonly known as: ZYRTEC Take 1 tablet by mouth daily as needed for allergies.   cloNIDine 0.1 MG tablet Commonly known as: Catapres Take 1 tablet (0.1 mg total) by mouth 2 (two) times daily.   [employer] D3 25 MCG (1000 UT) capsule Generic drug: Cholecalciferol Take 1,000 Units by mouth daily.   Droxia 300 MG capsule Generic drug: hydroxyurea TAKE 1 CAPSULE (300 MG TOTAL) BY MOUTH DAILY. MAY TAKE WITH FOOD TO MINIMIZE GI SIDE EFFECTS.   feeding supplement (NEPRO CARB STEADY) Liqd Take 237 mLs by mouth 2 (two) times daily between meals.   furosemide 80 MG tablet Commonly known as: LASIX Take 1 tablet (80 mg total) by mouth 2 (two) times daily.   gabapentin 100 MG capsule Commonly known as: NEURONTIN Take 2 capsules (200 mg total) by mouth at bedtime. What changed: You  were already taking a medication with the same name, and this prescription was added. Make sure you understand how and when to take each.   gabapentin 100 MG capsule Commonly known as: NEURONTIN Take 1 capsule (100 mg total) by mouth 2 (two) times daily with breakfast and lunch. Start taking on: March 13, 2023 What changed:  how much to take when to take this additional instructions   glucose blood test strip Use once daily. Use as instructed.   HYDROcodone-acetaminophen 5-325 MG tablet Commonly known as: NORCO/VICODIN Take 0.5-1 tablets by mouth 2 (two) times daily as needed for severe pain. What changed: reasons to take this   latanoprost 0.005 % ophthalmic solution Commonly known as: XALATAN Place 1 drop into both eyes at bedtime.   levothyroxine 88 MCG tablet Commonly known as: SYNTHROID Take 88 mcg by mouth daily. Take on an empty stomach 30 to 60 minutes before breakfast   metoprolol tartrate 25 MG tablet Commonly known as: LOPRESSOR Take 0.5 tablets (12.5 mg total) by mouth 2 (two) times daily.   multivitamin Tabs tablet Take 1 tablet by mouth at bedtime.   pantoprazole 40 MG tablet Commonly known as: PROTONIX Take 1 tablet (40 mg total) by mouth daily.   polyethylene glycol 17 g packet Commonly known as: MIRALAX / GLYCOLAX Take 17 g by mouth daily.   rOPINIRole 0.5 MG tablet Commonly known as: REQUIP Take 0.5 mg by mouth at bedtime.   rosuvastatin 20 MG tablet Commonly known as: CRESTOR Take 20 mg by mouth daily.   timolol 0.5 % ophthalmic gel-forming Commonly known as: TIMOPTIC-XR Place 1 drop into both eyes every morning.               Discharge Care Instructions  (From admission, onward)           Start     Ordered   03/12/23 0000  Discharge wound care:       Comments: Keep Permcath site clean and dry. Dressing change as needed to temp cath site until healed   03/12/23 1619             Contact information for after-discharge  care     Destination     [facility] Preferred SNF .   Service: Skilled Nursing Contact information: [address] [place] [address] [phone]                     Allergies  Allergen Reactions  Atorvastatin Other (See Comments)    MYALGIA   Cyclobenzaprine Other (See Comments)   Hydrocodone-Acetaminophen     Other reaction(s): Hallucination when taking whole pill, pt tolerates taking 1/2 pill   Mirtazapine     Other reaction(s): Hallucination   Oxycodone-Acetaminophen Other (See Comments)   Paroxetine Hcl Other (See Comments)   Penicillins Swelling    Lip and orbital swelling   Propoxyphene Other (See Comments)   Trazodone Other (See Comments)     Subjective: pt feeling well today, no complaints    Discharge Exam: BP 121/86 (BP Location: Left Arm)   Pulse 60   Temp 97.9 F (36.6 C)   Resp 18   Wt 60.3 kg   SpO2 92%   BMI 25.96 kg/m  General: Pt is alert, awake, not in acute distress Cardiovascular: RRR, S1/S2 +, no rubs, no gallops Respiratory: CTA bilaterally, no wheezing, no rhonchi Abdominal: Soft, NT, ND, bowel sounds + Extremities: no edema, no cyanosis     The results of significant diagnostics from this hospitalization (including imaging, microbiology, ancillary and laboratory) are listed below for reference.     Microbiology: Recent Results (from the past 240 hour(s))  Blood culture (single)     Status: None   Collection Time: 03/05/23 11:45 AM   Specimen: BLOOD  Result Value Ref Range Status   Specimen Description BLOOD BLOOD LEFT HAND  Final   Special Requests   Final    BOTTLES DRAWN AEROBIC AND ANAEROBIC Blood Culture adequate volume   Culture   Final    NO GROWTH 5 DAYS Performed at [facility], [address] [place], [place] [address]    Report Status 03/10/2023 FINAL  Final  SARS Coronavirus 2 by RT PCR (hospital order, performed in [facility] hospital  lab) *cepheid single result test* Anterior Nasal Swab     Status: None   Collection Time: 03/05/23 12:02 PM   Specimen: Anterior Nasal Swab  Result Value Ref Range Status   SARS Coronavirus 2 by RT PCR NEGATIVE NEGATIVE Final    Comment: (NOTE) SARS-CoV-2 target nucleic acids are NOT DETECTED.  The SARS-CoV-2 RNA is generally detectable in upper and lower respiratory specimens during the acute phase of infection. The lowest concentration of SARS-CoV-2 viral copies this assay can detect is 250 copies / mL. A negative result does not preclude SARS-CoV-2 infection and should not be used as the sole basis for treatment or other patient management decisions.  A negative result may occur with improper specimen collection / handling, submission of specimen other than nasopharyngeal swab, presence of viral mutation(s) within the areas targeted by this assay, and inadequate number of viral copies (<250 copies / mL). A negative result must be combined with clinical observations, patient history, and epidemiological information.  Fact Sheet for Patients:   RoadLapTop.co.za  Fact Sheet for Healthcare Providers: http://kim-miller.com/  This test is not yet approved or  cleared by the [place] FDA and has been authorized for detection and/or diagnosis of SARS-CoV-2 by FDA under an Emergency Use Authorization (EUA).  This EUA will remain in effect (meaning this test can be used) for the duration of the COVID-19 declaration under Section 564(b)(1) of the Act, 21 U.S.C. section 360bbb-3(b)(1), unless the authorization is terminated or revoked sooner.  Performed at [facility], [address] [place], [place] [address]   Respiratory (~20 pathogens) panel by PCR     Status: None   Collection Time: 03/05/23  4:00 PM   Specimen:  Nasopharyngeal Swab; Respiratory  Result Value Ref Range Status   Adenovirus NOT DETECTED NOT DETECTED Final    Coronavirus 229E NOT DETECTED NOT DETECTED Final    Comment: (NOTE) The Coronavirus on the Respiratory Panel, DOES NOT test for the novel  Coronavirus (2019 nCoV)    Coronavirus HKU1 NOT DETECTED NOT DETECTED Final   Coronavirus NL63 NOT DETECTED NOT DETECTED Final   Coronavirus OC43 NOT DETECTED NOT DETECTED Final   Metapneumovirus NOT DETECTED NOT DETECTED Final   Rhinovirus / Enterovirus NOT DETECTED NOT DETECTED Final   Influenza A NOT DETECTED NOT DETECTED Final   Influenza B NOT DETECTED NOT DETECTED Final   Parainfluenza Virus 1 NOT DETECTED NOT DETECTED Final   Parainfluenza Virus 2 NOT DETECTED NOT DETECTED Final   Parainfluenza Virus 3 NOT DETECTED NOT DETECTED Final   Parainfluenza Virus 4 NOT DETECTED NOT DETECTED Final   Respiratory Syncytial Virus NOT DETECTED NOT DETECTED Final   Bordetella pertussis NOT DETECTED NOT DETECTED Final   Bordetella Parapertussis NOT DETECTED NOT DETECTED Final   Chlamydophila pneumoniae NOT DETECTED NOT DETECTED Final   Mycoplasma pneumoniae NOT DETECTED NOT DETECTED Final    Comment: Performed at [facility], [address] [place], [place] [address]     Labs: BNP (last 3 results) Recent Labs    03/05/23 1146  BNP 792.3*   Basic Metabolic Panel: Recent Labs  Lab 03/06/23 0609 03/07/23 0505 03/08/23 0835 03/10/23 1250  NA 139 138 139 131*  K 6.2* 4.2 3.9 3.5  CL 109 103 102 93*  CO2 18* 22 25 25   GLUCOSE 74 96 83 147*  BUN 81* 56* 38* 36*  CREATININE 8.52* 6.19* 5.05* 5.48*  CALCIUM 7.6* 7.0* 7.5* 7.3*  PHOS  --   --  5.5* 5.7*   Liver Function Tests: Recent Labs  Lab 03/06/23 0609 03/08/23 0835 03/10/23 1250  AST 28  --   --   ALT 12  --   --   ALKPHOS 46  --   --   BILITOT 1.0  --   --   PROT 6.5  --   --   ALBUMIN 2.5* 2.1* 2.2*   No results for input(s): "LIPASE", "AMYLASE" in the last 168 hours. No results for input(s): "AMMONIA" in the last 168 hours. CBC: Recent Labs  Lab 03/06/23 0609  03/07/23 0505 03/08/23 0835 03/10/23 1250  WBC 19.5* 17.6* 14.6* 13.6*  HGB 11.7* 12.0 11.4* 10.9*  HCT 38.8 39.0 37.3 36.5  MCV 85.5 82.5 83.4 85.7  PLT 652* 595* 520* 420*   Cardiac Enzymes: No results for input(s): "CKTOTAL", "CKMB", "CKMBINDEX", "TROPONINI" in the last 168 hours. BNP: Invalid input(s): "POCBNP" CBG: Recent Labs  Lab 03/06/23 1802 03/06/23 1857 03/06/23 2219  GLUCAP 67* 90 119*   D-Dimer No results for input(s): "DDIMER" in the last 72 hours. Hgb A1c No results for input(s): "HGBA1C" in the last 72 hours. Lipid Profile No results for input(s): "CHOL", "HDL", "LDLCALC", "TRIG", "CHOLHDL", "LDLDIRECT" in the last 72 hours. Thyroid function studies No results for input(s): "TSH", "T4TOTAL", "T3FREE", "THYROIDAB" in the last 72 hours.  Invalid input(s): "FREET3" Anemia work up No results for input(s): "VITAMINB12", "FOLATE", "FERRITIN", "TIBC", "IRON", "RETICCTPCT" in the last 72 hours. Urinalysis    Component Value Date/Time   COLORURINE STRAW (A) 12/12/2021 1702   APPEARANCEUR CLEAR (A) 12/12/2021 1702   LABSPEC 1.005 12/12/2021 1702   PHURINE 7.0 12/12/2021 1702   GLUCOSEU 50 (A) 12/12/2021 1702  HGBUR MODERATE (A) 12/12/2021 1702   BILIRUBINUR NEGATIVE 12/12/2021 1702   KETONESUR NEGATIVE 12/12/2021 1702   PROTEINUR 100 (A) 12/12/2021 1702   NITRITE NEGATIVE 12/12/2021 1702   LEUKOCYTESUR SMALL (A) 12/12/2021 1702   Sepsis Labs Recent Labs  Lab 03/06/23 0609 03/07/23 0505 03/08/23 0835 03/10/23 1250  WBC 19.5* 17.6* 14.6* 13.6*   Microbiology Recent Results (from the past 240 hour(s))  Blood culture (single)     Status: None   Collection Time: 03/05/23 11:45 AM   Specimen: BLOOD  Result Value Ref Range Status   Specimen Description BLOOD BLOOD LEFT HAND  Final   Special Requests   Final    BOTTLES DRAWN AEROBIC AND ANAEROBIC Blood Culture adequate volume   Culture   Final    NO GROWTH 5 DAYS Performed at [facility],  [address] [place], [place] [address]    Report Status 03/10/2023 FINAL  Final  SARS Coronavirus 2 by RT PCR (hospital order, performed in [facility] hospital lab) *cepheid single result test* Anterior Nasal Swab     Status: None   Collection Time: 03/05/23 12:02 PM   Specimen: Anterior Nasal Swab  Result Value Ref Range Status   SARS Coronavirus 2 by RT PCR NEGATIVE NEGATIVE Final    Comment: (NOTE) SARS-CoV-2 target nucleic acids are NOT DETECTED.  The SARS-CoV-2 RNA is generally detectable in upper and lower respiratory specimens during the acute phase of infection. The lowest concentration of SARS-CoV-2 viral copies this assay can detect is 250 copies / mL. A negative result does not preclude SARS-CoV-2 infection and should not be used as the sole basis for treatment or other patient management decisions.  A negative result may occur with improper specimen collection / handling, submission of specimen other than nasopharyngeal swab, presence of viral mutation(s) within the areas targeted by this assay, and inadequate number of viral copies (<250 copies / mL). A negative result must be combined with clinical observations, patient history, and epidemiological information.  Fact Sheet for Patients:   RoadLapTop.co.za  Fact Sheet for Healthcare Providers: http://kim-miller.com/  This test is not yet approved or  cleared by the [place] FDA and has been authorized for detection and/or diagnosis of SARS-CoV-2 by FDA under an Emergency Use Authorization (EUA).  This EUA will remain in effect (meaning this test can be used) for the duration of the COVID-19 declaration under Section 564(b)(1) of the Act, 21 U.S.C. section 360bbb-3(b)(1), unless the authorization is terminated or revoked sooner.  Performed at [facility], [address] [place], [place] [address]   Respiratory (~20 pathogens) panel by PCR      Status: None   Collection Time: 03/05/23  4:00 PM   Specimen: Nasopharyngeal Swab; Respiratory  Result Value Ref Range Status   Adenovirus NOT DETECTED NOT DETECTED Final   Coronavirus 229E NOT DETECTED NOT DETECTED Final    Comment: (NOTE) The Coronavirus on the Respiratory Panel, DOES NOT test for the novel  Coronavirus (2019 nCoV)    Coronavirus HKU1 NOT DETECTED NOT DETECTED Final   Coronavirus NL63 NOT DETECTED NOT DETECTED Final   Coronavirus OC43 NOT DETECTED NOT DETECTED Final   Metapneumovirus NOT DETECTED NOT DETECTED Final   Rhinovirus / Enterovirus NOT DETECTED NOT DETECTED Final   Influenza A NOT DETECTED NOT DETECTED Final   Influenza B NOT DETECTED NOT DETECTED Final   Parainfluenza Virus 1 NOT DETECTED NOT DETECTED Final   Parainfluenza Virus 2 NOT DETECTED NOT DETECTED Final  Parainfluenza Virus 3 NOT DETECTED NOT DETECTED Final   Parainfluenza Virus 4 NOT DETECTED NOT DETECTED Final   Respiratory Syncytial Virus NOT DETECTED NOT DETECTED Final   Bordetella pertussis NOT DETECTED NOT DETECTED Final   Bordetella Parapertussis NOT DETECTED NOT DETECTED Final   Chlamydophila pneumoniae NOT DETECTED NOT DETECTED Final   Mycoplasma pneumoniae NOT DETECTED NOT DETECTED Final    Comment: Performed at [facility] [place] [facility], [address], [place], [place] [address]   Imaging ECHOCARDIOGRAM COMPLETE  Result Date: 03/07/2023    ECHOCARDIOGRAM REPORT   Patient Name:   Cassandra [name] Date of Exam: 03/06/2023 Medical Rec #:  [id number]       Height:       60.0 in Accession #:    [id number]      Weight:       153.1 lb Date of Birth:  26-Jan-1927      BSA:          1.666 m Patient Age:    89 years        BP:           150/67 mmHg Patient Gender: F               HR:           85 bpm. Exam Location:  [facility] Procedure: 2D Echo, Cardiac Doppler and Color Doppler Indications:     Other cardiac sounds  History:         Patient has prior history of Echocardiogram examinations, most                   recent 10/14/2021. CAD, Stroke; Risk Factors:Hypertension,                  Diabetes and Dyslipidemia. ESRD.  Sonographer:     Mikki Harbor Referring Phys:  [phone] Francoise Schaumann [place] Diagnosing Phys: Marcina Millard MD IMPRESSIONS  1. Left ventricular ejection fraction, by estimation, is 60 to 65%. The left ventricle has normal function. The left ventricle has no regional wall motion abnormalities. Left ventricular diastolic parameters are consistent with Grade I diastolic dysfunction (impaired relaxation).  2. Right ventricular systolic function is normal. The right ventricular size is normal. There is normal pulmonary artery systolic pressure.  3. The mitral valve is normal in structure. Mild to moderate mitral valve regurgitation. No evidence of mitral stenosis.  4. The aortic valve is normal in structure. Aortic valve regurgitation is mild to moderate. No aortic stenosis is present.  5. The inferior vena cava is normal in size with greater than 50% respiratory variability, suggesting right atrial pressure of 3 mmHg. FINDINGS  Left Ventricle: Left ventricular ejection fraction, by estimation, is 60 to 65%. The left ventricle has normal function. The left ventricle has no regional wall motion abnormalities. The left ventricular internal cavity size was normal in size. There is  no left ventricular hypertrophy. Left ventricular diastolic parameters are consistent with Grade I diastolic dysfunction (impaired relaxation). Right Ventricle: The right ventricular size is normal. No increase in right ventricular wall thickness. Right ventricular systolic function is normal. There is normal pulmonary artery systolic pressure. The tricuspid regurgitant velocity is 2.37 m/s, and  with an assumed right atrial pressure of 3 mmHg, the estimated right ventricular systolic pressure is 25.5 mmHg. Left Atrium: Left atrial size was normal in size. Right Atrium: Right atrial size was normal in size. Pericardium: There is no  evidence of pericardial effusion. Mitral Valve: The mitral  valve is normal in structure. Mild to moderate mitral valve regurgitation. No evidence of mitral valve stenosis. MV peak gradient, 9.6 mmHg. The mean mitral valve gradient is 3.0 mmHg. Tricuspid Valve: The tricuspid valve is normal in structure. Tricuspid valve regurgitation is not demonstrated. No evidence of tricuspid stenosis. Aortic Valve: The aortic valve is normal in structure. Aortic valve regurgitation is mild to moderate. No aortic stenosis is present. Aortic valve mean gradient measures 7.5 mmHg. Aortic valve peak gradient measures 13.5 mmHg. Aortic valve area, by VTI measures 2.16 cm. Pulmonic Valve: The pulmonic valve was normal in structure. Pulmonic valve regurgitation is not visualized. No evidence of pulmonic stenosis. Aorta: The aortic root is normal in size and structure. Venous: The inferior vena cava is normal in size with greater than 50% respiratory variability, suggesting right atrial pressure of 3 mmHg. IAS/Shunts: No atrial level shunt detected by color flow Doppler.  LEFT VENTRICLE PLAX 2D LVIDd:         4.60 cm   Diastology LVIDs:         2.90 cm   LV e' medial:    6.53 cm/s LV PW:         1.40 cm   LV E/e' medial:  12.0 LV IVS:        1.20 cm   LV e' lateral:   9.03 cm/s LVOT diam:     2.00 cm   LV E/e' lateral: 8.7 LV SV:         86 LV SV Index:   52 LVOT Area:     3.14 cm  RIGHT VENTRICLE RV Basal diam:  2.75 cm RV Mid diam:    2.70 cm RV S prime:     16.00 cm/s LEFT ATRIUM             Index        RIGHT ATRIUM           Index LA diam:        3.80 cm 2.28 cm/m   RA Area:     13.60 cm LA Vol (A2C):   71.0 ml 42.61 ml/m  RA Volume:   29.80 ml  17.88 ml/m LA Vol (A4C):   42.5 ml 25.51 ml/m LA Biplane Vol: 58.3 ml 34.99 ml/m  AORTIC VALVE                     PULMONIC VALVE AV Area (Vmax):    2.27 cm      PV Vmax:       0.94 m/s AV Area (Vmean):   2.19 cm      PV Peak grad:  3.5 mmHg AV Area (VTI):     2.16 cm AV Vmax:            184.00 cm/s AV Vmean:          124.500 cm/s AV VTI:            0.400 m AV Peak Grad:      13.5 mmHg AV Mean Grad:      7.5 mmHg LVOT Vmax:         133.00 cm/s LVOT Vmean:        86.800 cm/s LVOT VTI:          0.275 m LVOT/AV VTI ratio: 0.69  AORTA Ao Root diam: 2.90 cm MITRAL VALVE                TRICUSPID VALVE MV Area (PHT): 3.46  cm     TR Peak grad:   22.5 mmHg MV Area VTI:   2.57 cm     TR Vmax:        237.00 cm/s MV Peak grad:  9.6 mmHg MV Mean grad:  3.0 mmHg     SHUNTS MV Vmax:       1.55 m/s     Systemic VTI:  0.28 m MV Vmean:      83.4 cm/s    Systemic Diam: 2.00 cm MV Decel Time: 219 msec MV E velocity: 78.60 cm/s MV A velocity: 148.00 cm/s MV E/A ratio:  0.53 Marcina Millard MD Electronically signed by Marcina Millard MD Signature Date/Time: 03/07/2023/4:39:51 PM    Final       Time coordinating discharge: over 30 minutes  SIGNED:  [name] DO [facility]

## 2023-03-13 DIAGNOSIS — J9601 Acute respiratory failure with hypoxia: Secondary | ICD-10-CM | POA: Diagnosis not present

## 2023-03-13 LAB — RENAL FUNCTION PANEL
Albumin: 2.4 g/dL — ABNORMAL LOW (ref 3.5–5.0)
Anion gap: 14 (ref 5–15)
BUN: 38 mg/dL — ABNORMAL HIGH (ref 8–23)
CO2: 23 mmol/L (ref 22–32)
Calcium: 7.4 mg/dL — ABNORMAL LOW (ref 8.9–10.3)
Chloride: 90 mmol/L — ABNORMAL LOW (ref 98–111)
Creatinine, Ser: 6.17 mg/dL — ABNORMAL HIGH (ref 0.44–1.00)
GFR, Estimated: 6 mL/min — ABNORMAL LOW (ref >60)
Glucose, Bld: 87 mg/dL (ref 70–99)
Phosphorus: 6.1 mg/dL — ABNORMAL HIGH (ref 2.5–4.6)
Potassium: 3.7 mmol/L (ref 3.5–5.1)
Sodium: 127 mmol/L — ABNORMAL LOW (ref 135–145)

## 2023-03-13 LAB — CBC
HCT: 35.1 % — ABNORMAL LOW (ref 36.0–46.0)
Hemoglobin: 10.6 g/dL — ABNORMAL LOW (ref 12.0–15.0)
MCH: 25.3 pg — ABNORMAL LOW (ref 26.0–34.0)
MCHC: 30.2 g/dL (ref 30.0–36.0)
MCV: 83.8 fL (ref 80.0–100.0)
Platelets: 450 10*3/uL — ABNORMAL HIGH (ref 150–400)
RBC: 4.19 MIL/uL (ref 3.87–5.11)
RDW: 17.8 % — ABNORMAL HIGH (ref 11.5–15.5)
WBC: 17.6 10*3/uL — ABNORMAL HIGH (ref 4.0–10.5)
nRBC: 0 % (ref 0.0–0.2)

## 2023-03-13 MED ORDER — FUROSEMIDE 80 MG PO TABS: 80.0000 mg | ORAL_TABLET | Freq: Every day | ORAL | 0 refills | Status: DC

## 2023-03-13 MED ORDER — MUSCLE RUB 10-15 % EX CREA
TOPICAL_CREAM | CUTANEOUS | Status: DC | PRN
  Administered 2023-03-13: 1 via TOPICAL
  Filled 2023-03-13: qty 85

## 2023-03-13 NOTE — Discharge Planning (Signed)
Placement Resolved for SNF placement Outpatient Facility  [facility]  [address]  [place] [address] [phone]  Scheduled days: Monday Wednesday and Friday  Treatment time: 5:40am Start: Friday 6/21 @ 5:20am

## 2023-03-13 NOTE — Plan of Care (Signed)
  Problem: Activity: Goal: Ability to tolerate increased activity will improve Outcome: Progressing   Problem: Clinical Measurements: Goal: Ability to maintain a body temperature in the normal range will improve Outcome: Progressing   Problem: Respiratory: Goal: Ability to maintain adequate ventilation will improve Outcome: Progressing Goal: Ability to maintain a clear airway will improve Outcome: Progressing   Problem: Education: Goal: Knowledge of disease and its progression will improve Outcome: Progressing   Problem: Fluid Volume: Goal: Compliance with measures to maintain balanced fluid volume will improve Outcome: Progressing   Problem: Health Behavior/Discharge Planning: Goal: Ability to manage health-related needs will improve Outcome: Not Progressing   Problem: Nutritional: Goal: Ability to make healthy dietary choices will improve Outcome: Progressing   Problem: Clinical Measurements: Goal: Complications related to the disease process, condition or treatment will be avoided or minimized Outcome: Progressing   Problem: Education: Goal: Knowledge of General Education information will improve Description: Including pain rating scale, medication(s)/side effects and non-pharmacologic comfort measures Outcome: Progressing   Problem: Health Behavior/Discharge Planning: Goal: Ability to manage health-related needs will improve Outcome: Not Progressing   Problem: Clinical Measurements: Goal: Ability to maintain clinical measurements within normal limits will improve Outcome: Progressing Goal: Will remain free from infection Outcome: Progressing Goal: Diagnostic test results will improve Outcome: Progressing Goal: Respiratory complications will improve Outcome: Progressing Goal: Cardiovascular complication will be avoided Outcome: Progressing   Problem: Activity: Goal: Risk for activity intolerance will decrease Outcome: Progressing   Problem:  Nutrition: Goal: Adequate nutrition will be maintained Outcome: Progressing   Problem: Elimination: Goal: Will not experience complications related to bowel motility Outcome: Progressing Goal: Will not experience complications related to urinary retention Outcome: Progressing   Problem: Skin Integrity: Goal: Risk for impaired skin integrity will decrease Outcome: Progressing   Problem: Safety: Goal: Ability to remain free from injury will improve Outcome: Progressing

## 2023-03-13 NOTE — Progress Notes (Signed)
PT Cancellation Note  Patient Details Name: Cassandra [name] MRN: [id number] DOB: 12/31/1926   Cancelled Treatment:    Reason Eval/Treat Not Completed: Patient at procedure or test/unavailable.  Pt currently off unit at dialysis.  Will re-attempt PT session at a later date/time.   Hendricks Limes, PT 03/13/23, 12:27 PM

## 2023-03-13 NOTE — Progress Notes (Signed)
[facility] [place], [place] 03/13/23  Subjective:   Hospital day # 8 Patient known to our practice from outpatient follow-up for CKD.   Patient seen and evaluated during dialysis    HEMODIALYSIS FLOWSHEET:  Blood Flow Rate (mL/min): 350 mL/min Arterial Pressure (mmHg): -200 mmHg Venous Pressure (mmHg): 150 mmHg TMP (mmHg): 6 mmHg Ultrafiltration Rate (mL/min): 522 mL/min Dialysate Flow Rate (mL/min): 300 ml/min Dialysis Fluid Bolus: Normal Saline Bolus Amount (mL): 100 mL  Tolerating treatment well   Renal: 06/17 0701 - 06/18 0700 In: 240 [P.O.:240] Out: 1050 [Urine:1050] Lab Results  Component Value Date   CREATININE 6.17 (H) 03/13/2023   CREATININE 5.48 (H) 03/10/2023   CREATININE 5.05 (H) 03/08/2023     Objective:  Vital signs in last 24 hours:  Temp:  [97.5 F (36.4 C)-98.2 F (36.8 C)] 97.8 F (36.6 C) (06/18 0836) Pulse Rate:  [56-68] 68 (06/18 1030) Resp:  [15-24] 19 (06/18 1030) BP: (98-142)/(55-79) 133/70 (06/18 1030) SpO2:  [92 %-96 %] 92 % (06/18 1030) Weight:  [60.7 kg] 60.7 kg (06/18 0836)  Weight change:  Filed Weights   03/10/23 1240 03/10/23 1620 03/13/23 0836  Weight: 61 kg 60.3 kg 60.7 kg    Intake/Output:    Intake/Output Summary (Last 24 hours) at 03/13/2023 1203 Last data filed at 03/13/2023 0500 Gross per 24 hour  Intake 240 ml  Output 500 ml  Net -260 ml      Physical Exam: General: NAD  HEENT Normocephalic   Pulm/lungs Diminished in bases., [place] O2  CVS/Heart regular rate and rhythm   Abdomen:  Soft, nontender, nondistended  Extremities: Dependent edema present  Neurologic: Alert, able to answer questions  Skin: No acute rashes  Access: Rt chest PermCath       Basic Metabolic Panel:  Recent Labs  Lab 03/07/23 0505 03/08/23 0835 03/10/23 1250 03/13/23 0742  NA 138 139 131* 127*  K 4.2 3.9 3.5 3.7  CL 103 102 93* 90*  CO2 22 25 25 23   GLUCOSE 96 83 147* 87  BUN 56* 38* 36* 38*   CREATININE 6.19* 5.05* 5.48* 6.17*  CALCIUM 7.0* 7.5* 7.3* 7.4*  PHOS  --  5.5* 5.7* 6.1*      CBC: Recent Labs  Lab 03/07/23 0505 03/08/23 0835 03/10/23 1250 03/13/23 0742  WBC 17.6* 14.6* 13.6* 17.6*  HGB 12.0 11.4* 10.9* 10.6*  HCT 39.0 37.3 36.5 35.1*  MCV 82.5 83.4 85.7 83.8  PLT 595* 520* 420* 450*       Lab Results  Component Value Date   HEPBSAG NON REACTIVE 03/05/2023      Microbiology:  Recent Results (from the past 240 hour(s))  Blood culture (single)     Status: None   Collection Time: 03/05/23 11:45 AM   Specimen: BLOOD  Result Value Ref Range Status   Specimen Description BLOOD BLOOD LEFT HAND  Final   Special Requests   Final    BOTTLES DRAWN AEROBIC AND ANAEROBIC Blood Culture adequate volume   Culture   Final    NO GROWTH 5 DAYS Performed at [facility], [address] [place], [place] [address]    Report Status 03/10/2023 FINAL  Final  SARS Coronavirus 2 by RT PCR (hospital order, performed in [facility] hospital lab) *cepheid single result test* Anterior Nasal Swab     Status: None   Collection Time: 03/05/23 12:02 PM   Specimen: Anterior Nasal Swab  Result Value Ref Range Status   SARS Coronavirus 2  by RT PCR NEGATIVE NEGATIVE Final    Comment: (NOTE) SARS-CoV-2 target nucleic acids are NOT DETECTED.  The SARS-CoV-2 RNA is generally detectable in upper and lower respiratory specimens during the acute phase of infection. The lowest concentration of SARS-CoV-2 viral copies this assay can detect is 250 copies / mL. A negative result does not preclude SARS-CoV-2 infection and should not be used as the sole basis for treatment or other patient management decisions.  A negative result may occur with improper specimen collection / handling, submission of specimen other than nasopharyngeal swab, presence of viral mutation(s) within the areas targeted by this assay, and inadequate number of viral copies (<250 copies / mL). A  negative result must be combined with clinical observations, patient history, and epidemiological information.  Fact Sheet for Patients:   RoadLapTop.co.za  Fact Sheet for Healthcare Providers: http://kim-miller.com/  This test is not yet approved or  cleared by the [place] FDA and has been authorized for detection and/or diagnosis of SARS-CoV-2 by FDA under an Emergency Use Authorization (EUA).  This EUA will remain in effect (meaning this test can be used) for the duration of the COVID-19 declaration under Section 564(b)(1) of the Act, 21 U.S.C. section 360bbb-3(b)(1), unless the authorization is terminated or revoked sooner.  Performed at [facility], [address] [place], [place] [address]   Respiratory (~20 pathogens) panel by PCR     Status: None   Collection Time: 03/05/23  4:00 PM   Specimen: Nasopharyngeal Swab; Respiratory  Result Value Ref Range Status   Adenovirus NOT DETECTED NOT DETECTED Final   Coronavirus 229E NOT DETECTED NOT DETECTED Final    Comment: (NOTE) The Coronavirus on the Respiratory Panel, DOES NOT test for the novel  Coronavirus (2019 nCoV)    Coronavirus HKU1 NOT DETECTED NOT DETECTED Final   Coronavirus NL63 NOT DETECTED NOT DETECTED Final   Coronavirus OC43 NOT DETECTED NOT DETECTED Final   Metapneumovirus NOT DETECTED NOT DETECTED Final   Rhinovirus / Enterovirus NOT DETECTED NOT DETECTED Final   Influenza A NOT DETECTED NOT DETECTED Final   Influenza B NOT DETECTED NOT DETECTED Final   Parainfluenza Virus 1 NOT DETECTED NOT DETECTED Final   Parainfluenza Virus 2 NOT DETECTED NOT DETECTED Final   Parainfluenza Virus 3 NOT DETECTED NOT DETECTED Final   Parainfluenza Virus 4 NOT DETECTED NOT DETECTED Final   Respiratory Syncytial Virus NOT DETECTED NOT DETECTED Final   Bordetella pertussis NOT DETECTED NOT DETECTED Final   Bordetella Parapertussis NOT DETECTED NOT DETECTED Final    Chlamydophila pneumoniae NOT DETECTED NOT DETECTED Final   Mycoplasma pneumoniae NOT DETECTED NOT DETECTED Final    Comment: Performed at [facility], [address] [place], [place] [address]    Coagulation Studies: No results for input(s): "LABPROT", "INR" in the last 72 hours.  Urinalysis: No results for input(s): "COLORURINE", "LABSPEC", "PHURINE", "GLUCOSEU", "HGBUR", "BILIRUBINUR", "KETONESUR", "PROTEINUR", "UROBILINOGEN", "NITRITE", "LEUKOCYTESUR" in the last 72 hours.  Invalid input(s): "APPERANCEUR"    Imaging: No results found.   Medications:    anticoagulant sodium citrate      amLODipine  10 mg Oral Daily   aspirin EC  81 mg Oral Daily   Chlorhexidine Gluconate Cloth  6 each Topical Q0600   cholecalciferol  1,000 Units Oral Daily   cloNIDine  0.1 mg Oral BID   feeding supplement (NEPRO CARB STEADY)  237 mL Oral BID BM   furosemide  80 mg Oral BID   gabapentin  100 mg Oral BID WC   gabapentin  200 mg Oral QHS   heparin  5,000 Units Subcutaneous Q8H   hydroxyurea  300 mg Oral Daily   latanoprost  1 drop Both Eyes QHS   levothyroxine  88 mcg Oral Daily   loratadine  10 mg Oral Daily   metoprolol tartrate  12.5 mg Oral BID   multivitamin  1 tablet Oral QHS   pantoprazole  40 mg Oral Daily   polyethylene glycol  17 g Oral Daily   rOPINIRole  0.5 mg Oral QHS   rosuvastatin  10 mg Oral Daily   timolol  1 drop Both Eyes q morning   acetaminophen, albuterol, ALPRAZolam, alteplase, anticoagulant sodium citrate, heparin, lidocaine (PF), lidocaine-prilocaine, Muscle Rub, ondansetron **OR** ondansetron (ZOFRAN) IV, pentafluoroprop-tetrafluoroeth  Assessment/ Plan:  89 y.o. female with coronary artery disease, diabetes type 2, chronic kidney disease, hyperlipidemia, hypertension, hypothyroidism    admitted on 03/05/2023 for Acute pulmonary edema (HCC) [J81.0] Renal failure [N19] ESRD (end stage renal disease) (HCC) [N18.6] Acute respiratory failure with  hypoxia (HCC) [J96.01] Community acquired pneumonia, unspecified laterality [J18.9]  End-stage renal disease Patient has advanced chronic kidney disease.  Her baseline creatinine is 5.96/GFR 6 from April 2024. She now comes in with volume overload, hyperkalemia and metabolic acidosis.    Receiving dialysis today, UF 1L as tolerated. Patient cleared to discharge from renal stance. Due to full dialysis treatment today, Next treatment scheduled for Friday at assigned outpatient clinic.   Appreciate renal navigator confirming outpatient dialysis clinic at [facility] on a MWF schedule.   Acute metabolic acidosis Corrected  Volume overload/acute respiratory failure BiPAP required on admission.  Currently on 2L [place] Oral furosemide 80mg  BID on nondialysis days only  Hyperkalemia Corrected    LOS: 8 [facility] 6/18/202412:03 PM  [facility] [place], [place] [phone]

## 2023-03-13 NOTE — Discharge Summary (Signed)
Physician Discharge Summary   Patient: Cassandra [name] MRN: [id number]  DOB: 08/03/1927   Admit:     Date of Admission: 03/05/2023 Admitted from: home   Discharge: Date of discharge: 03/13/23  Disposition: Skilled nursing facility Condition at discharge: good  CODE STATUS: DNR     Discharge Physician: [name], DO [facility]     PCP: Danella Penton, MD  Recommendations for Outpatient Follow-up:  Follow up with PCP Danella Penton, MD in 2-4 weeks Please obtain labs/tests: CBC, BMP in 2-4 weeks Follow as directed for dialysis / nephrology visits  Needs contrast CT chest in 4-6 to follow lung nodule  Please follow up on the following pending results: none     Discharge Instructions     (HEART FAILURE PATIENTS) Call MD:  Anytime you have any of the following symptoms: 1) 3 pound weight gain in 24 hours or 5 pounds in 1 week 2) shortness of breath, with or without a dry hacking cough 3) swelling in the hands, feet or stomach 4) if you have to sleep on extra pillows at night in order to breathe.   Complete by: As directed    Call MD for:  difficulty breathing, headache or visual disturbances   Complete by: As directed    Call MD for:  severe uncontrolled pain   Complete by: As directed    Diet - low sodium heart healthy   Complete by: As directed    Discharge patient   Complete by: As directed    Discharge disposition: 03-Skilled Nursing Facility   Discharge patient date: 03/13/2023   Discharge wound care:   Complete by: As directed    Keep Permcath site clean and dry. Dressing change as needed to temp cath site until healed   Increase activity slowly   Complete by: As directed          Discharge Diagnoses: Principal Problem:   Acute respiratory failure with hypoxia (HCC) Active Problems:   Pneumonia   Acute renal failure superimposed on stage 4 chronic kidney disease (HCC)   Diabetes mellitus, type II (HCC)   Hypertension   CAD  (coronary artery disease), native coronary artery   Restless legs syndrome   Acquired hypothyroidism   Peripheral neuropathy   Vitamin D deficiency   ESRD (end stage renal disease) (HCC)   Hyperkalemia   Renal failure       Hospital Course: Cassandra [name] is a 89 y.o. female with medical history significant of coronary artery disease, type 2 diabetes, stage IV CKD, hyperlipidemia, hypertension, hypothyroidism presenting with acute respiratory failure with hypoxia, volume overload, acute on chronic stage IV CKD, pneumonia.  Patient reports progressive increased work of breathing over the past 2 to 3 days.  06/10: ER needing BiPap, apparent fluid overload, diuresed and also tc CAP. Vascular surgery placed temporary cath at the bedside. Resp PCR neg. CT chest adenopathy, mild interstitial edema and mild effusion, will need outpatient f/u.  06/11: hemodialysis initiated today. Requiring HFNC O2 06/12: continue HD 06/13: O2 down to 5L/min. Permcath placement  06/14: confirmed outpatient HD setup - plan HD tomorrow, hopefully can reduce O2 requirement, she is still needing 5L/min. PT/OT recs for SNF 06/15 (Sat): HD today, placement pending, O2 down to 2L  06/16: placement pending  06/17: pending dialysis outpatient (needed to change HD facility d/t SNF location) but medically stable  06/18: confirmed outpatient HD plan, stable fir discharge to SNF   Consultants:  Vascular surgery Nephrology  Procedures: 06/10: R femoral temp cath placement for HD - removed 06/13 06/13: R IJ perm cath placement      ASSESSMENT & PLAN:   Acute respiratory failure with hypoxia (HCC) d/t fluid overload in setting of renal failure, also complicated by potential pneumonia noted on CXR and supported by leukocytosis and elevated procalcitonin - improving  O2 support noninvasive, pt is DNR  Treat underlying causes as below  Follow respiratory status with diuresis, hemodialysis and pneumonia treatment -  improving   ESRD Hemodialysis initiated Renal navigator confirmed TTS outpatient dialysis but then needed to change this d/t placement     Questionable Community Acquired Pneumonia - completed tx  Urine Legionella, urine strep, BCx, Expanded respiratory panel negative for viral illness. CT chest to better correlate --> no lobular infiltrate   White count trending down - monitor CBC s/p azithromycin to cover pneumonia x5 days   Acquired hypothyroidism Continue Synthroid   CAD (coronary artery disease), native coronary artery No active chest pain Troponin negative x 1 EKG stable Follow ASA, statin, BB  Diabetes mellitus, type II (HCC) Monitor Glc   Hypertension BP elevated Amlodipine, clonidine restarted Continue Lasix  Hyperkalemia - improved  K 5.8 in setting of acute on chronic stage 4 CKD- requiring HD  EKG stable  Monitor closely  Abn CT chest  Mild mediastinal adenopathy, RIGHT hilar adenopathy and RIGHT lower lobe atelectasis. Small RIGHT effusion. Differential include RIGHT hilar malignancy versus benign effusion and atelectasis versus pneumonia. Mild interstitial edema pattern. Recommend clinical correlation and follow-up contrast CT in 4 to 6 weeks.   GERD w/ Hx GI bleed  Protonix   HLD Crestor   Anxiety Alprazolam prn   Hx CVA ASA, statin   Neuropathy and RLS Gabapentin             Discharge Instructions  Allergies as of 03/13/2023       Reactions   Atorvastatin Other (See Comments)   MYALGIA   Cyclobenzaprine Other (See Comments)   Hydrocodone-acetaminophen    Other reaction(s): Hallucination when taking whole pill, pt tolerates taking 1/2 pill   Mirtazapine    Other reaction(s): Hallucination   Oxycodone-acetaminophen Other (See Comments)   Paroxetine Hcl Other (See Comments)   Penicillins Swelling   Lip and orbital swelling   Propoxyphene Other (See Comments)   Trazodone Other (See Comments)        Medication List      STOP taking these medications    hydrochlorothiazide 25 MG tablet Commonly known as: HYDRODIURIL   naloxone 4 MG/0.1ML Liqd nasal spray kit Commonly known as: NARCAN   torsemide 20 MG tablet Commonly known as: DEMADEX       TAKE these medications    acetaminophen 325 MG tablet Commonly known as: TYLENOL Take 2 tablets (650 mg total) by mouth every 6 (six) hours as needed for mild pain.   albuterol 108 (90 Base) MCG/ACT inhaler Commonly known as: VENTOLIN HFA Inhale 2 puffs into the lungs every 4 (four) hours as needed.   ALPRAZolam 0.25 MG tablet Commonly known as: XANAX Take 1 tablet (0.25 mg total) by mouth daily as needed.   amLODipine 10 MG tablet Commonly known as: NORVASC Take 1 tablet by mouth daily.   aspirin EC 81 MG tablet Take 1 tablet (81 mg total) by mouth daily. Swallow whole.   azelastine 0.1 % nasal spray Commonly known as: ASTELIN Place 2 sprays into both nostrils 2 (two) times daily.   cetirizine 10 MG tablet  Commonly known as: ZYRTEC Take 1 tablet by mouth daily as needed for allergies.   cloNIDine 0.1 MG tablet Commonly known as: Catapres Take 1 tablet (0.1 mg total) by mouth 2 (two) times daily.   [employer] D3 25 MCG (1000 UT) capsule Generic drug: Cholecalciferol Take 1,000 Units by mouth daily.   Droxia 300 MG capsule Generic drug: hydroxyurea TAKE 1 CAPSULE (300 MG TOTAL) BY MOUTH DAILY. MAY TAKE WITH FOOD TO MINIMIZE GI SIDE EFFECTS.   feeding supplement (NEPRO CARB STEADY) Liqd Take 237 mLs by mouth 2 (two) times daily between meals.   furosemide 80 MG tablet Commonly known as: LASIX Take 1 tablet (80 mg total) by mouth daily.   gabapentin 100 MG capsule Commonly known as: NEURONTIN Take 2 capsules (200 mg total) by mouth at bedtime. What changed: You were already taking a medication with the same name, and this prescription was added. Make sure you understand how and when to take each.   gabapentin 100 MG capsule Commonly  known as: NEURONTIN Take 1 capsule (100 mg total) by mouth 2 (two) times daily with breakfast and lunch. What changed:  how much to take when to take this additional instructions   glucose blood test strip Use once daily. Use as instructed.   HYDROcodone-acetaminophen 5-325 MG tablet Commonly known as: NORCO/VICODIN Take 0.5-1 tablets by mouth 2 (two) times daily as needed for severe pain. What changed: reasons to take this   latanoprost 0.005 % ophthalmic solution Commonly known as: XALATAN Place 1 drop into both eyes at bedtime.   levothyroxine 88 MCG tablet Commonly known as: SYNTHROID Take 88 mcg by mouth daily. Take on an empty stomach 30 to 60 minutes before breakfast   metoprolol tartrate 25 MG tablet Commonly known as: LOPRESSOR Take 0.5 tablets (12.5 mg total) by mouth 2 (two) times daily.   multivitamin Tabs tablet Take 1 tablet by mouth at bedtime.   pantoprazole 40 MG tablet Commonly known as: PROTONIX Take 1 tablet (40 mg total) by mouth daily.   polyethylene glycol 17 g packet Commonly known as: MIRALAX / GLYCOLAX Take 17 g by mouth daily.   rOPINIRole 0.5 MG tablet Commonly known as: REQUIP Take 0.5 mg by mouth at bedtime.   rosuvastatin 20 MG tablet Commonly known as: CRESTOR Take 20 mg by mouth daily.   timolol 0.5 % ophthalmic gel-forming Commonly known as: TIMOPTIC-XR Place 1 drop into both eyes every morning.               Discharge Care Instructions  (From admission, onward)           Start     Ordered   03/12/23 0000  Discharge wound care:       Comments: Keep Permcath site clean and dry. Dressing change as needed to temp cath site until healed   03/12/23 1619             Contact information for after-discharge care     Destination     [facility] Preferred SNF .   Service: Skilled Nursing Contact information: [address] [place] [address] [phone]                      Allergies  Allergen Reactions   Atorvastatin Other (See Comments)    MYALGIA   Cyclobenzaprine Other (See Comments)   Hydrocodone-Acetaminophen     Other reaction(s): Hallucination when taking whole pill, pt tolerates taking 1/2  pill   Mirtazapine     Other reaction(s): Hallucination   Oxycodone-Acetaminophen Other (See Comments)   Paroxetine Hcl Other (See Comments)   Penicillins Swelling    Lip and orbital swelling   Propoxyphene Other (See Comments)   Trazodone Other (See Comments)     Subjective: pt feeling well today, no complaints. Examined in dialysis suite, feeling well today and eager for d/c to rehab    Discharge Exam: BP (!) 148/108 (BP Location: Left Arm)   Pulse 79   Temp 97.8 F (36.6 C) (Oral)   Resp (!) 26   Wt 60.7 kg   SpO2 93%   BMI 26.13 kg/m  General: Pt is alert, awake, not in acute distress Cardiovascular: RRR, S1/S2 +, no rubs, no gallops Respiratory: CTA bilaterally, no wheezing, no rhonchi Abdominal: Soft, NT, ND, bowel sounds + Extremities: no edema, no cyanosis     The results of significant diagnostics from this hospitalization (including imaging, microbiology, ancillary and laboratory) are listed below for reference.     Microbiology: Recent Results (from the past 240 hour(s))  Blood culture (single)     Status: None   Collection Time: 03/05/23 11:45 AM   Specimen: BLOOD  Result Value Ref Range Status   Specimen Description BLOOD BLOOD LEFT HAND  Final   Special Requests   Final    BOTTLES DRAWN AEROBIC AND ANAEROBIC Blood Culture adequate volume   Culture   Final    NO GROWTH 5 DAYS Performed at [facility], [address], [place], [place] [address]    Report Status 03/10/2023 FINAL  Final  SARS Coronavirus 2 by RT PCR (hospital order, performed in [facility] hospital lab) *cepheid single result test* Anterior Nasal Swab     Status: None   Collection Time: 03/05/23 12:02 PM   Specimen:  Anterior Nasal Swab  Result Value Ref Range Status   SARS Coronavirus 2 by RT PCR NEGATIVE NEGATIVE Final    Comment: (NOTE) SARS-CoV-2 target nucleic acids are NOT DETECTED.  The SARS-CoV-2 RNA is generally detectable in upper and lower respiratory specimens during the acute phase of infection. The lowest concentration of SARS-CoV-2 viral copies this assay can detect is 250 copies / mL. A negative result does not preclude SARS-CoV-2 infection and should not be used as the sole basis for treatment or other patient management decisions.  A negative result may occur with improper specimen collection / handling, submission of specimen other than nasopharyngeal swab, presence of viral mutation(s) within the areas targeted by this assay, and inadequate number of viral copies (<250 copies / mL). A negative result must be combined with clinical observations, patient history, and epidemiological information.  Fact Sheet for Patients:   RoadLapTop.co.za  Fact Sheet for Healthcare Providers: http://kim-miller.com/  This test is not yet approved or  cleared by the [place] FDA and has been authorized for detection and/or diagnosis of SARS-CoV-2 by FDA under an Emergency Use Authorization (EUA).  This EUA will remain in effect (meaning this test can be used) for the duration of the COVID-19 declaration under Section 564(b)(1) of the Act, 21 U.S.C. section 360bbb-3(b)(1), unless the authorization is terminated or revoked sooner.  Performed at [facility], [address] [place], [place] [address]   Respiratory (~20 pathogens) panel by PCR     Status: None   Collection Time: 03/05/23  4:00 PM   Specimen: Nasopharyngeal Swab; Respiratory  Result Value Ref Range Status   Adenovirus NOT DETECTED NOT DETECTED  Final   Coronavirus 229E NOT DETECTED NOT DETECTED Final    Comment: (NOTE) The Coronavirus on the Respiratory Panel, DOES  NOT test for the novel  Coronavirus (2019 nCoV)    Coronavirus HKU1 NOT DETECTED NOT DETECTED Final   Coronavirus NL63 NOT DETECTED NOT DETECTED Final   Coronavirus OC43 NOT DETECTED NOT DETECTED Final   Metapneumovirus NOT DETECTED NOT DETECTED Final   Rhinovirus / Enterovirus NOT DETECTED NOT DETECTED Final   Influenza A NOT DETECTED NOT DETECTED Final   Influenza B NOT DETECTED NOT DETECTED Final   Parainfluenza Virus 1 NOT DETECTED NOT DETECTED Final   Parainfluenza Virus 2 NOT DETECTED NOT DETECTED Final   Parainfluenza Virus 3 NOT DETECTED NOT DETECTED Final   Parainfluenza Virus 4 NOT DETECTED NOT DETECTED Final   Respiratory Syncytial Virus NOT DETECTED NOT DETECTED Final   Bordetella pertussis NOT DETECTED NOT DETECTED Final   Bordetella Parapertussis NOT DETECTED NOT DETECTED Final   Chlamydophila pneumoniae NOT DETECTED NOT DETECTED Final   Mycoplasma pneumoniae NOT DETECTED NOT DETECTED Final    Comment: Performed at [facility], [address] [place], [place] [address]     Labs: BNP (last 3 results) Recent Labs    03/05/23 1146  BNP 792.3*   Basic Metabolic Panel: Recent Labs  Lab 03/07/23 0505 03/08/23 0835 03/10/23 1250 03/13/23 0742  NA 138 139 131* 127*  K 4.2 3.9 3.5 3.7  CL 103 102 93* 90*  CO2 22 25 25 23   GLUCOSE 96 83 147* 87  BUN 56* 38* 36* 38*  CREATININE 6.19* 5.05* 5.48* 6.17*  CALCIUM 7.0* 7.5* 7.3* 7.4*  PHOS  --  5.5* 5.7* 6.1*   Liver Function Tests: Recent Labs  Lab 03/08/23 0835 03/10/23 1250 03/13/23 0742  ALBUMIN 2.1* 2.2* 2.4*   No results for input(s): "LIPASE", "AMYLASE" in the last 168 hours. No results for input(s): "AMMONIA" in the last 168 hours. CBC: Recent Labs  Lab 03/07/23 0505 03/08/23 0835 03/10/23 1250 03/13/23 0742  WBC 17.6* 14.6* 13.6* 17.6*  HGB 12.0 11.4* 10.9* 10.6*  HCT 39.0 37.3 36.5 35.1*  MCV 82.5 83.4 85.7 83.8  PLT 595* 520* 420* 450*   Cardiac Enzymes: No results for input(s):  "CKTOTAL", "CKMB", "CKMBINDEX", "TROPONINI" in the last 168 hours. BNP: Invalid input(s): "POCBNP" CBG: Recent Labs  Lab 03/06/23 1802 03/06/23 1857 03/06/23 2219  GLUCAP 67* 90 119*   D-Dimer No results for input(s): "DDIMER" in the last 72 hours. Hgb A1c No results for input(s): "HGBA1C" in the last 72 hours. Lipid Profile No results for input(s): "CHOL", "HDL", "LDLCALC", "TRIG", "CHOLHDL", "LDLDIRECT" in the last 72 hours. Thyroid function studies No results for input(s): "TSH", "T4TOTAL", "T3FREE", "THYROIDAB" in the last 72 hours.  Invalid input(s): "FREET3" Anemia work up No results for input(s): "VITAMINB12", "FOLATE", "FERRITIN", "TIBC", "IRON", "RETICCTPCT" in the last 72 hours. Urinalysis    Component Value Date/Time   COLORURINE STRAW (A) 12/12/2021 1702   APPEARANCEUR CLEAR (A) 12/12/2021 1702   LABSPEC 1.005 12/12/2021 1702   PHURINE 7.0 12/12/2021 1702   GLUCOSEU 50 (A) 12/12/2021 1702   HGBUR MODERATE (A) 12/12/2021 1702   BILIRUBINUR NEGATIVE 12/12/2021 1702   KETONESUR NEGATIVE 12/12/2021 1702   PROTEINUR 100 (A) 12/12/2021 1702   NITRITE NEGATIVE 12/12/2021 1702   LEUKOCYTESUR SMALL (A) 12/12/2021 1702   Sepsis Labs Recent Labs  Lab 03/07/23 0505 03/08/23 0835 03/10/23 1250 03/13/23 0742  WBC 17.6* 14.6* 13.6* 17.6*   Microbiology Recent  Results (from the past 240 hour(s))  Blood culture (single)     Status: None   Collection Time: 03/05/23 11:45 AM   Specimen: BLOOD  Result Value Ref Range Status   Specimen Description BLOOD BLOOD LEFT HAND  Final   Special Requests   Final    BOTTLES DRAWN AEROBIC AND ANAEROBIC Blood Culture adequate volume   Culture   Final    NO GROWTH 5 DAYS Performed at [facility], [address], [place], [place] [address]    Report Status 03/10/2023 FINAL  Final  SARS Coronavirus 2 by RT PCR (hospital order, performed in [facility] hospital lab) *cepheid single result test* Anterior Nasal Swab      Status: None   Collection Time: 03/05/23 12:02 PM   Specimen: Anterior Nasal Swab  Result Value Ref Range Status   SARS Coronavirus 2 by RT PCR NEGATIVE NEGATIVE Final    Comment: (NOTE) SARS-CoV-2 target nucleic acids are NOT DETECTED.  The SARS-CoV-2 RNA is generally detectable in upper and lower respiratory specimens during the acute phase of infection. The lowest concentration of SARS-CoV-2 viral copies this assay can detect is 250 copies / mL. A negative result does not preclude SARS-CoV-2 infection and should not be used as the sole basis for treatment or other patient management decisions.  A negative result may occur with improper specimen collection / handling, submission of specimen other than nasopharyngeal swab, presence of viral mutation(s) within the areas targeted by this assay, and inadequate number of viral copies (<250 copies / mL). A negative result must be combined with clinical observations, patient history, and epidemiological information.  Fact Sheet for Patients:   RoadLapTop.co.za  Fact Sheet for Healthcare Providers: http://kim-miller.com/  This test is not yet approved or  cleared by the [place] FDA and has been authorized for detection and/or diagnosis of SARS-CoV-2 by FDA under an Emergency Use Authorization (EUA).  This EUA will remain in effect (meaning this test can be used) for the duration of the COVID-19 declaration under Section 564(b)(1) of the Act, 21 U.S.C. section 360bbb-3(b)(1), unless the authorization is terminated or revoked sooner.  Performed at [facility], [address] [place], [place] [address]   Respiratory (~20 pathogens) panel by PCR     Status: None   Collection Time: 03/05/23  4:00 PM   Specimen: Nasopharyngeal Swab; Respiratory  Result Value Ref Range Status   Adenovirus NOT DETECTED NOT DETECTED Final   Coronavirus 229E NOT DETECTED NOT DETECTED Final     Comment: (NOTE) The Coronavirus on the Respiratory Panel, DOES NOT test for the novel  Coronavirus (2019 nCoV)    Coronavirus HKU1 NOT DETECTED NOT DETECTED Final   Coronavirus NL63 NOT DETECTED NOT DETECTED Final   Coronavirus OC43 NOT DETECTED NOT DETECTED Final   Metapneumovirus NOT DETECTED NOT DETECTED Final   Rhinovirus / Enterovirus NOT DETECTED NOT DETECTED Final   Influenza A NOT DETECTED NOT DETECTED Final   Influenza B NOT DETECTED NOT DETECTED Final   Parainfluenza Virus 1 NOT DETECTED NOT DETECTED Final   Parainfluenza Virus 2 NOT DETECTED NOT DETECTED Final   Parainfluenza Virus 3 NOT DETECTED NOT DETECTED Final   Parainfluenza Virus 4 NOT DETECTED NOT DETECTED Final   Respiratory Syncytial Virus NOT DETECTED NOT DETECTED Final   Bordetella pertussis NOT DETECTED NOT DETECTED Final   Bordetella Parapertussis NOT DETECTED NOT DETECTED Final   Chlamydophila pneumoniae NOT DETECTED NOT DETECTED Final   Mycoplasma pneumoniae NOT DETECTED NOT  DETECTED Final    Comment: Performed at [facility], [address] [place], [place] [address]   Imaging ECHOCARDIOGRAM COMPLETE  Result Date: 03/07/2023    ECHOCARDIOGRAM REPORT   Patient Name:   VAUDA Scouten Date of Exam: 03/06/2023 Medical Rec #:  [id number]       Height:       60.0 in Accession #:    [id number]      Weight:       153.1 lb Date of Birth:  1927-03-05      BSA:          1.666 m Patient Age:    89 years        BP:           150/67 mmHg Patient Gender: F               HR:           85 bpm. Exam Location:  [facility] Procedure: 2D Echo, Cardiac Doppler and Color Doppler Indications:     Other cardiac sounds  History:         Patient has prior history of Echocardiogram examinations, most                  recent 10/14/2021. CAD, Stroke; Risk Factors:Hypertension,                  Diabetes and Dyslipidemia. ESRD.  Sonographer:     Mikki Harbor Referring Phys:  [phone] Francoise Schaumann [place] Diagnosing Phys: Marcina Millard MD  IMPRESSIONS  1. Left ventricular ejection fraction, by estimation, is 60 to 65%. The left ventricle has normal function. The left ventricle has no regional wall motion abnormalities. Left ventricular diastolic parameters are consistent with Grade I diastolic dysfunction (impaired relaxation).  2. Right ventricular systolic function is normal. The right ventricular size is normal. There is normal pulmonary artery systolic pressure.  3. The mitral valve is normal in structure. Mild to moderate mitral valve regurgitation. No evidence of mitral stenosis.  4. The aortic valve is normal in structure. Aortic valve regurgitation is mild to moderate. No aortic stenosis is present.  5. The inferior vena cava is normal in size with greater than 50% respiratory variability, suggesting right atrial pressure of 3 mmHg. FINDINGS  Left Ventricle: Left ventricular ejection fraction, by estimation, is 60 to 65%. The left ventricle has normal function. The left ventricle has no regional wall motion abnormalities. The left ventricular internal cavity size was normal in size. There is  no left ventricular hypertrophy. Left ventricular diastolic parameters are consistent with Grade I diastolic dysfunction (impaired relaxation). Right Ventricle: The right ventricular size is normal. No increase in right ventricular wall thickness. Right ventricular systolic function is normal. There is normal pulmonary artery systolic pressure. The tricuspid regurgitant velocity is 2.37 m/s, and  with an assumed right atrial pressure of 3 mmHg, the estimated right ventricular systolic pressure is 25.5 mmHg. Left Atrium: Left atrial size was normal in size. Right Atrium: Right atrial size was normal in size. Pericardium: There is no evidence of pericardial effusion. Mitral Valve: The mitral valve is normal in structure. Mild to moderate mitral valve regurgitation. No evidence of mitral valve stenosis. MV peak gradient, 9.6 mmHg. The mean mitral valve  gradient is 3.0 mmHg. Tricuspid Valve: The tricuspid valve is normal in structure. Tricuspid valve regurgitation is not demonstrated. No evidence of tricuspid stenosis. Aortic Valve: The aortic valve is normal in structure. Aortic valve regurgitation  is mild to moderate. No aortic stenosis is present. Aortic valve mean gradient measures 7.5 mmHg. Aortic valve peak gradient measures 13.5 mmHg. Aortic valve area, by VTI measures 2.16 cm. Pulmonic Valve: The pulmonic valve was normal in structure. Pulmonic valve regurgitation is not visualized. No evidence of pulmonic stenosis. Aorta: The aortic root is normal in size and structure. Venous: The inferior vena cava is normal in size with greater than 50% respiratory variability, suggesting right atrial pressure of 3 mmHg. IAS/Shunts: No atrial level shunt detected by color flow Doppler.  LEFT VENTRICLE PLAX 2D LVIDd:         4.60 cm   Diastology LVIDs:         2.90 cm   LV e' medial:    6.53 cm/s LV PW:         1.40 cm   LV E/e' medial:  12.0 LV IVS:        1.20 cm   LV e' lateral:   9.03 cm/s LVOT diam:     2.00 cm   LV E/e' lateral: 8.7 LV SV:         86 LV SV Index:   52 LVOT Area:     3.14 cm  RIGHT VENTRICLE RV Basal diam:  2.75 cm RV Mid diam:    2.70 cm RV S prime:     16.00 cm/s LEFT ATRIUM             Index        RIGHT ATRIUM           Index LA diam:        3.80 cm 2.28 cm/m   RA Area:     13.60 cm LA Vol (A2C):   71.0 ml 42.61 ml/m  RA Volume:   29.80 ml  17.88 ml/m LA Vol (A4C):   42.5 ml 25.51 ml/m LA Biplane Vol: 58.3 ml 34.99 ml/m  AORTIC VALVE                     PULMONIC VALVE AV Area (Vmax):    2.27 cm      PV Vmax:       0.94 m/s AV Area (Vmean):   2.19 cm      PV Peak grad:  3.5 mmHg AV Area (VTI):     2.16 cm AV Vmax:           184.00 cm/s AV Vmean:          124.500 cm/s AV VTI:            0.400 m AV Peak Grad:      13.5 mmHg AV Mean Grad:      7.5 mmHg LVOT Vmax:         133.00 cm/s LVOT Vmean:        86.800 cm/s LVOT VTI:          0.275  m LVOT/AV VTI ratio: 0.69  AORTA Ao Root diam: 2.90 cm MITRAL VALVE                TRICUSPID VALVE MV Area (PHT): 3.46 cm     TR Peak grad:   22.5 mmHg MV Area VTI:   2.57 cm     TR Vmax:        237.00 cm/s MV Peak grad:  9.6 mmHg MV Mean grad:  3.0 mmHg     SHUNTS MV Vmax:       1.55 m/s  Systemic VTI:  0.28 m MV Vmean:      83.4 cm/s    Systemic Diam: 2.00 cm MV Decel Time: 219 msec MV E velocity: 78.60 cm/s MV A velocity: 148.00 cm/s MV E/A ratio:  0.53 Marcina Millard MD Electronically signed by Marcina Millard MD Signature Date/Time: 03/07/2023/4:39:51 PM    Final       Time coordinating discharge: over 30 minutes  SIGNED:  [name] DO [facility]

## 2023-03-13 NOTE — Progress Notes (Signed)
  Received patient in bed to unit.   Informed consent signed and in chart.    TX duration: 3hrs     Transported back to floor  Hand-off given to patient's nurse. No c/o and no distress noted    Access used: R HD Catheter  Access issues: none   Total UF removed: 1.0L Medication(s) given: none Post HD VS: 156/63 Post HD weight: 59.9kg     Lynann Beaver  [facility]

## 2023-03-13 NOTE — TOC Transition Note (Signed)
Transition of Care [facility]) - CM/SW Discharge Note   Patient Details  Name: Cassandra [name] MRN: [id number] Date of Birth: 02/21/1927  [facility]) CM/SW Contact:  Kreg Shropshire, RN Phone Number: 03/13/2023, 12:50 PM   Clinical Narrative:    Patient will DC to: [facility] Anticipated DC date: 03/13/23 Family notified: [facility] Transport by: Non emergent EMS  Per MD patient ready for DC to . RN, patient, patient's family, and facility notified of DC. Discharge Summary sent to facility. RN given number for report. DC packet on chart. Ambulance transport requested for patient.  TOC signing off.    Final next level of care: Skilled Nursing Facility     Patient Goals and CMS Choice      Discharge Placement                  Patient to be transferred to facility by: non emergency ems Name of family member notified: Mayford Knife husband Patient and family notified of of transfer: 03/13/23  Discharge Plan and Services Additional resources added to the After Visit Summary for                  DME Arranged:  (May need home oxygen)   Date DME Agency Contacted: 03/09/23 (sent referral awaiting response) Time DME Agency Contacted: [id number] Representative spoke with at DME Agency: Mayra Reel of [facility] HH Arranged: PT, OT [facility] Agency: [facility] Date [facility] Agency Contacted: 03/09/23 Time HH Agency Contacted: 1559 Representative spoke with at [facility] [place] Agency: Amy Hyatt  Social Determinants of Health (SDOH) Interventions SDOH Screenings   Food Insecurity: No Food Insecurity (03/06/2023)  Housing: Low Risk  (03/06/2023)  Transportation Needs: No Transportation Needs (03/06/2023)  Utilities: Not At Risk (03/06/2023)  Tobacco Use: Medium Risk (03/09/2023)     Readmission Risk Interventions    03/09/2023    4:09 PM  Readmission Risk Prevention Plan  Transportation Screening Complete  PCP or Specialist Appt within 3-5 Days Complete  HRI or Home Care  Consult Complete  Social Work Consult for Recovery Care Planning/Counseling Complete  Palliative Care Screening Not Applicable  Medication Review Oceanographer) Complete

## 2023-04-17 ENCOUNTER — Ambulatory Visit (INDEPENDENT_AMBULATORY_CARE_PROVIDER_SITE_OTHER): Payer: Medicare Other | Admitting: Vascular Surgery

## 2023-05-16 ENCOUNTER — Ambulatory Visit (INDEPENDENT_AMBULATORY_CARE_PROVIDER_SITE_OTHER): Payer: Medicare Other | Admitting: Nurse Practitioner

## 2023-05-16 ENCOUNTER — Encounter (INDEPENDENT_AMBULATORY_CARE_PROVIDER_SITE_OTHER): Payer: Self-pay | Admitting: Nurse Practitioner

## 2023-05-16 VITALS — BP 106/65 | HR 63 | Resp 18 | Ht 60.0 in | Wt 132.0 lb

## 2023-05-16 DIAGNOSIS — I10 Essential (primary) hypertension: Secondary | ICD-10-CM | POA: Diagnosis not present

## 2023-05-16 DIAGNOSIS — N186 End stage renal disease: Secondary | ICD-10-CM | POA: Diagnosis not present

## 2023-05-16 DIAGNOSIS — E782 Mixed hyperlipidemia: Secondary | ICD-10-CM | POA: Diagnosis not present

## 2023-05-17 NOTE — Progress Notes (Signed)
Subjective:    Patient ID: Cassandra [name], female    DOB: 07/18/1927, 89 y.o.   MRN: [id number] Chief Complaint  Patient presents with   Follow-up    LS 12/2022. consult. access placement. v-map done this year. lateef    Cassandra [name] is a 89 year old female who returns today for evaluation of a hemodialysis access device.  At the last time we saw the patient she was unsure as to whether she wished to do dialysis however she suffered an emergency event and ultimately began dialysis.  She is currently maintained via PermCath.  She notes that it is uncomfortable for her but it is functioning well. Previously, she underwent upper extremity vein mapping which shows adequate borderline vein diameter for creation of a left upper extremity brachiocephalic AV fistula.    Review of Systems  Cardiovascular:  Positive for leg swelling.  Neurological:  Positive for weakness.  All other systems reviewed and are negative.      Objective:   Physical Exam Vitals reviewed.  Cardiovascular:     Rate and Rhythm: Normal rate.     Pulses:          Radial pulses are 2+ on the right side and 2+ on the left side.  Pulmonary:     Effort: Pulmonary effort is normal.  Neurological:     Mental Status: She is alert and oriented to person, place, and time.     Gait: Gait abnormal.  Psychiatric:        Mood and Affect: Mood normal.        Behavior: Behavior normal.        Thought Content: Thought content normal.        Judgment: Judgment normal.     BP 106/65 (BP Location: Left Arm)   Pulse 63   Resp 18   Ht 5' (1.524 m)   Wt 132 lb (59.9 kg)   BMI 25.78 kg/m   Past Medical History:  Diagnosis Date   Arthritis    osteoarthritis in hands, knees ( R torn cartilage, L meniscus removed)    CAD (coronary artery disease)    Diabetes mellitus without complication (HCC)    Glaucoma    Hyperlipidemia    Hypertension    Hypothyroidism     Social History   Socioeconomic History   Marital  status: Married    Spouse name: Not on file   Number of children: Not on file   Years of education: Not on file   Highest education level: Not on file  Occupational History   Not on file  Tobacco Use   Smoking status: Former    Current packs/day: 0.00    Types: Cigarettes    Quit date: 01/21/1980    Years since quitting: 43.3   Smokeless tobacco: Never  Substance and Sexual Activity   Alcohol use: Yes    Alcohol/week: 0.0 standard drinks of alcohol    Comment: occasionally   Drug use: No   Sexual activity: Never  Other Topics Concern   Not on file  Social History Narrative   Not on file   Social Determinants of Health   Financial Resource Strain: Not on file  Food Insecurity: No Food Insecurity (03/06/2023)   Hunger Vital Sign    Worried About Running Out of Food in the Last Year: Never true    Ran Out of Food in the Last Year: Never true  Transportation Needs: No Transportation Needs (03/06/2023)   PRAPARE - Transportation  Lack of Transportation (Medical): No    Lack of Transportation (Non-Medical): No  Physical Activity: Not on file  Stress: Not on file  Social Connections: Not on file  Intimate Partner Violence: Not At Risk (03/06/2023)   Humiliation, Afraid, Rape, and Kick questionnaire    Fear of Current or Ex-Partner: No    Emotionally Abused: No    Physically Abused: No    Sexually Abused: No    Past Surgical History:  Procedure Laterality Date   ABDOMINAL HYSTERECTOMY     CORONARY ANGIOPLASTY WITH STENT PLACEMENT     DIALYSIS/PERMA CATHETER INSERTION N/A 03/08/2023   Procedure: DIALYSIS/PERMA CATHETER INSERTION;  Surgeon: Annice Needy, MD;  Location: [facility] INVASIVE CV LAB;  Service: Cardiovascular;  Laterality: N/A;   FOOT SURGERY Bilateral    KNEE ARTHROSCOPY W/ MENISCECTOMY Left    Also has R knee injury (torn cartilage)    TEMPORARY DIALYSIS CATHETER N/A 03/05/2023   Procedure: TEMPORARY DIALYSIS CATHETER;  Surgeon: Annice Needy, MD;  Location: [facility]  INVASIVE CV LAB;  Service: Cardiovascular;  Laterality: N/A;    Family History  Problem Relation Age of Onset   Hypertension Mother    Hypertension Father    Early death Father    Heart disease Father     Allergies  Allergen Reactions   Atorvastatin Other (See Comments)    MYALGIA   Cyclobenzaprine Other (See Comments)   Hydrocodone-Acetaminophen     Other reaction(s): Hallucination when taking whole pill, pt tolerates taking 1/2 pill   Mirtazapine     Other reaction(s): Hallucination   Oxycodone-Acetaminophen Other (See Comments)   Paroxetine Hcl Other (See Comments)   Penicillins Swelling    Lip and orbital swelling   Propoxyphene Other (See Comments)   Trazodone Other (See Comments)       Latest Ref Rng & Units 03/13/2023    7:42 AM 03/10/2023   12:50 PM 03/08/2023    8:35 AM  CBC  WBC 4.0 - 10.5 K/uL 17.6  13.6  14.6   Hemoglobin 12.0 - 15.0 g/dL 04.5  40.9  81.1   Hematocrit 36.0 - 46.0 % 35.1  36.5  37.3   Platelets 150 - 400 K/uL 450  420  520       CMP     Component Value Date/Time   NA 127 (L) 03/13/2023 0742   NA 137 12/07/2012 1641   K 3.7 03/13/2023 0742   K 4.2 12/07/2012 1641   CL 90 (L) 03/13/2023 0742   CL 106 12/07/2012 1641   CO2 23 03/13/2023 0742   CO2 27 12/07/2012 1641   GLUCOSE 87 03/13/2023 0742   GLUCOSE 75 12/07/2012 1641   BUN 38 (H) 03/13/2023 0742   BUN 18 12/07/2012 1641   CREATININE 6.17 (H) 03/13/2023 0742   CREATININE 1.13 12/07/2012 1641   CALCIUM 7.4 (L) 03/13/2023 0742   CALCIUM 8.9 12/07/2012 1641   PROT 6.5 03/06/2023 0609   PROT 8.6 (H) 12/07/2012 1641   ALBUMIN 2.4 (L) 03/13/2023 0742   ALBUMIN 3.2 (L) 12/07/2012 1641   AST 28 03/06/2023 0609   AST 37 12/07/2012 1641   ALT 12 03/06/2023 0609   ALT 33 12/07/2012 1641   ALKPHOS 46 03/06/2023 0609   ALKPHOS 72 12/07/2012 1641   BILITOT 1.0 03/06/2023 0609   BILITOT 0.4 12/07/2012 1641   GFRNONAA 6 (L) 03/13/2023 0742   GFRNONAA 45 (L) 12/07/2012 1641   GFRAA  25 (L) 03/30/2020 1026   GFRAA 52 (L)  12/07/2012 1641     No results found.     Assessment & Plan:   1. ESRD (end stage renal disease) (HCC) Recommend:  At this time the patient does not have appropriate extremity access for dialysis  Patient should have a left brachiocephalic AV fistula created.  Because her vein diameters are somewhat borderline I discussed that in some instances when the vessel is exposed it may proved to be smaller.  If that is true, she would have a brachial axillary AV graft inserted.  The patient is agreeable to this.  The risks, benefits and alternative therapies were reviewed in detail with the patient.  All questions were answered.  The patient agrees to proceed with surgery.   The patient will follow up with me in the office after the surgery.   2. Essential hypertension Continue antihypertensive medications as already ordered, these medications have been reviewed and there are no changes at this time.  Patient's blood pressure is somewhat elevated today.  Patient advised to continue to monitor at home if remains elevated to discuss with Dr. Hyacinth Meeker if medication adjustments are necessary.  3. Hyperlipidemia, mixed Continue statin as ordered and reviewed, no changes at this time     Current Outpatient Medications on File Prior to Visit  Medication Sig Dispense Refill   acetaminophen (TYLENOL) 325 MG tablet Take 2 tablets (650 mg total) by mouth every 6 (six) hours as needed for mild pain.     albuterol (VENTOLIN HFA) 108 (90 Base) MCG/ACT inhaler Inhale 2 puffs into the lungs every 4 (four) hours as needed.     ALPRAZolam (XANAX) 0.25 MG tablet Take 1 tablet (0.25 mg total) by mouth daily as needed. 10 tablet 0   amLODipine (NORVASC) 10 MG tablet Take 1 tablet by mouth daily.     aspirin EC 81 MG EC tablet Take 1 tablet (81 mg total) by mouth daily. Swallow whole. 30 tablet 11   azelastine (ASTELIN) 0.1 % nasal spray Place 2 sprays into both nostrils 2  (two) times daily.     calcium acetate (PHOSLO) 667 MG capsule Take 1,334 mg by mouth 3 (three) times daily.     cetirizine (ZYRTEC) 10 MG tablet Take 1 tablet by mouth daily as needed for allergies.     cloNIDine (CATAPRES) 0.1 MG tablet Take 1 tablet (0.1 mg total) by mouth 2 (two) times daily. 60 tablet 0   [employer] D3 25 MCG (1000 UT) capsule Take 1,000 Units by mouth daily.     DROXIA 300 MG capsule TAKE 1 CAPSULE (300 MG TOTAL) BY MOUTH DAILY. MAY TAKE WITH FOOD TO MINIMIZE GI SIDE EFFECTS. 30 capsule 0   furosemide (LASIX) 80 MG tablet Take 1 tablet (80 mg total) by mouth daily. 60 tablet 0   gabapentin (NEURONTIN) 100 MG capsule Take 1 capsule (100 mg total) by mouth 2 (two) times daily with breakfast and lunch. 60 capsule 0   gabapentin (NEURONTIN) 100 MG capsule Take 2 capsules (200 mg total) by mouth at bedtime. 60 capsule 0   glucose blood test strip Use once daily. Use as instructed.     HYDROcodone-acetaminophen (NORCO/VICODIN) 5-325 MG tablet Take 0.5-1 tablets by mouth 2 (two) times daily as needed for severe pain. 10 tablet 0   latanoprost (XALATAN) 0.005 % ophthalmic solution Place 1 drop into both eyes at bedtime.     levothyroxine (SYNTHROID) 88 MCG tablet Take 88 mcg by mouth daily. Take on an empty stomach 30 to 60 minutes  before breakfast     metoprolol tartrate (LOPRESSOR) 25 MG tablet Take 0.5 tablets (12.5 mg total) by mouth 2 (two) times daily. 60 tablet 0   multivitamin (RENA-VIT) TABS tablet Take 1 tablet by mouth at bedtime.  0   Nutritional Supplements (FEEDING SUPPLEMENT, NEPRO CARB STEADY,) LIQD Take 237 mLs by mouth 2 (two) times daily between meals.  0   pantoprazole (PROTONIX) 40 MG tablet Take 1 tablet (40 mg total) by mouth daily. 30 tablet 2   polyethylene glycol (MIRALAX / GLYCOLAX) 17 g packet Take 17 g by mouth daily. 14 each 0   rOPINIRole (REQUIP) 0.5 MG tablet Take 0.5 mg by mouth at bedtime.     rosuvastatin (CRESTOR) 20 MG tablet Take 20 mg by mouth daily.      timolol (TIMOPTIC-XR) 0.5 % ophthalmic gel-forming Place 1 drop into both eyes every morning.     No current facility-administered medications on file prior to visit.    There are no Patient Instructions on file for this visit. No follow-ups on file.   Georgiana Spinner, NP

## 2023-05-17 NOTE — H&P (View-Only) (Signed)
 Subjective:    Patient ID: Cassandra [name], female    DOB: 07/18/1927, 89 y.o.   MRN: [id number] Chief Complaint  Patient presents with   Follow-up    LS 12/2022. consult. access placement. v-map done this year. lateef    Cassandra [name] is a 89 year old female who returns today for evaluation of a hemodialysis access device.  At the last time we saw the patient she was unsure as to whether she wished to do dialysis however she suffered an emergency event and ultimately began dialysis.  She is currently maintained via PermCath.  She notes that it is uncomfortable for her but it is functioning well. Previously, she underwent upper extremity vein mapping which shows adequate borderline vein diameter for creation of a left upper extremity brachiocephalic AV fistula.    Review of Systems  Cardiovascular:  Positive for leg swelling.  Neurological:  Positive for weakness.  All other systems reviewed and are negative.      Objective:   Physical Exam Vitals reviewed.  Cardiovascular:     Rate and Rhythm: Normal rate.     Pulses:          Radial pulses are 2+ on the right side and 2+ on the left side.  Pulmonary:     Effort: Pulmonary effort is normal.  Neurological:     Mental Status: She is alert and oriented to person, place, and time.     Gait: Gait abnormal.  Psychiatric:        Mood and Affect: Mood normal.        Behavior: Behavior normal.        Thought Content: Thought content normal.        Judgment: Judgment normal.     BP 106/65 (BP Location: Left Arm)   Pulse 63   Resp 18   Ht 5' (1.524 m)   Wt 132 lb (59.9 kg)   BMI 25.78 kg/m   Past Medical History:  Diagnosis Date   Arthritis    osteoarthritis in hands, knees ( R torn cartilage, L meniscus removed)    CAD (coronary artery disease)    Diabetes mellitus without complication (HCC)    Glaucoma    Hyperlipidemia    Hypertension    Hypothyroidism     Social History   Socioeconomic History   Marital  status: Married    Spouse name: Not on file   Number of children: Not on file   Years of education: Not on file   Highest education level: Not on file  Occupational History   Not on file  Tobacco Use   Smoking status: Former    Current packs/day: 0.00    Types: Cigarettes    Quit date: 01/21/1980    Years since quitting: 43.3   Smokeless tobacco: Never  Substance and Sexual Activity   Alcohol use: Yes    Alcohol/week: 0.0 standard drinks of alcohol    Comment: occasionally   Drug use: No   Sexual activity: Never  Other Topics Concern   Not on file  Social History Narrative   Not on file   Social Determinants of Health   Financial Resource Strain: Not on file  Food Insecurity: No Food Insecurity (03/06/2023)   Hunger Vital Sign    Worried About Running Out of Food in the Last Year: Never true    Ran Out of Food in the Last Year: Never true  Transportation Needs: No Transportation Needs (03/06/2023)   PRAPARE - Transportation  Lack of Transportation (Medical): No    Lack of Transportation (Non-Medical): No  Physical Activity: Not on file  Stress: Not on file  Social Connections: Not on file  Intimate Partner Violence: Not At Risk (03/06/2023)   Humiliation, Afraid, Rape, and Kick questionnaire    Fear of Current or Ex-Partner: No    Emotionally Abused: No    Physically Abused: No    Sexually Abused: No    Past Surgical History:  Procedure Laterality Date   ABDOMINAL HYSTERECTOMY     CORONARY ANGIOPLASTY WITH STENT PLACEMENT     DIALYSIS/PERMA CATHETER INSERTION N/A 03/08/2023   Procedure: DIALYSIS/PERMA CATHETER INSERTION;  Surgeon: Annice Needy, MD;  Location: [facility] INVASIVE CV LAB;  Service: Cardiovascular;  Laterality: N/A;   FOOT SURGERY Bilateral    KNEE ARTHROSCOPY W/ MENISCECTOMY Left    Also has R knee injury (torn cartilage)    TEMPORARY DIALYSIS CATHETER N/A 03/05/2023   Procedure: TEMPORARY DIALYSIS CATHETER;  Surgeon: Annice Needy, MD;  Location: [facility]  INVASIVE CV LAB;  Service: Cardiovascular;  Laterality: N/A;    Family History  Problem Relation Age of Onset   Hypertension Mother    Hypertension Father    Early death Father    Heart disease Father     Allergies  Allergen Reactions   Atorvastatin Other (See Comments)    MYALGIA   Cyclobenzaprine Other (See Comments)   Hydrocodone-Acetaminophen     Other reaction(s): Hallucination when taking whole pill, pt tolerates taking 1/2 pill   Mirtazapine     Other reaction(s): Hallucination   Oxycodone-Acetaminophen Other (See Comments)   Paroxetine Hcl Other (See Comments)   Penicillins Swelling    Lip and orbital swelling   Propoxyphene Other (See Comments)   Trazodone Other (See Comments)       Latest Ref Rng & Units 03/13/2023    7:42 AM 03/10/2023   12:50 PM 03/08/2023    8:35 AM  CBC  WBC 4.0 - 10.5 K/uL 17.6  13.6  14.6   Hemoglobin 12.0 - 15.0 g/dL 04.5  40.9  81.1   Hematocrit 36.0 - 46.0 % 35.1  36.5  37.3   Platelets 150 - 400 K/uL 450  420  520       CMP     Component Value Date/Time   NA 127 (L) 03/13/2023 0742   NA 137 12/07/2012 1641   K 3.7 03/13/2023 0742   K 4.2 12/07/2012 1641   CL 90 (L) 03/13/2023 0742   CL 106 12/07/2012 1641   CO2 23 03/13/2023 0742   CO2 27 12/07/2012 1641   GLUCOSE 87 03/13/2023 0742   GLUCOSE 75 12/07/2012 1641   BUN 38 (H) 03/13/2023 0742   BUN 18 12/07/2012 1641   CREATININE 6.17 (H) 03/13/2023 0742   CREATININE 1.13 12/07/2012 1641   CALCIUM 7.4 (L) 03/13/2023 0742   CALCIUM 8.9 12/07/2012 1641   PROT 6.5 03/06/2023 0609   PROT 8.6 (H) 12/07/2012 1641   ALBUMIN 2.4 (L) 03/13/2023 0742   ALBUMIN 3.2 (L) 12/07/2012 1641   AST 28 03/06/2023 0609   AST 37 12/07/2012 1641   ALT 12 03/06/2023 0609   ALT 33 12/07/2012 1641   ALKPHOS 46 03/06/2023 0609   ALKPHOS 72 12/07/2012 1641   BILITOT 1.0 03/06/2023 0609   BILITOT 0.4 12/07/2012 1641   GFRNONAA 6 (L) 03/13/2023 0742   GFRNONAA 45 (L) 12/07/2012 1641   GFRAA  25 (L) 03/30/2020 1026   GFRAA 52 (L)  12/07/2012 1641     No results found.     Assessment & Plan:   1. ESRD (end stage renal disease) (HCC) Recommend:  At this time the patient does not have appropriate extremity access for dialysis  Patient should have a left brachiocephalic AV fistula created.  Because her vein diameters are somewhat borderline I discussed that in some instances when the vessel is exposed it may proved to be smaller.  If that is true, she would have a brachial axillary AV graft inserted.  The patient is agreeable to this.  The risks, benefits and alternative therapies were reviewed in detail with the patient.  All questions were answered.  The patient agrees to proceed with surgery.   The patient will follow up with me in the office after the surgery.   2. Essential hypertension Continue antihypertensive medications as already ordered, these medications have been reviewed and there are no changes at this time.  Patient's blood pressure is somewhat elevated today.  Patient advised to continue to monitor at home if remains elevated to discuss with Dr. Hyacinth Meeker if medication adjustments are necessary.  3. Hyperlipidemia, mixed Continue statin as ordered and reviewed, no changes at this time     Current Outpatient Medications on File Prior to Visit  Medication Sig Dispense Refill   acetaminophen (TYLENOL) 325 MG tablet Take 2 tablets (650 mg total) by mouth every 6 (six) hours as needed for mild pain.     albuterol (VENTOLIN HFA) 108 (90 Base) MCG/ACT inhaler Inhale 2 puffs into the lungs every 4 (four) hours as needed.     ALPRAZolam (XANAX) 0.25 MG tablet Take 1 tablet (0.25 mg total) by mouth daily as needed. 10 tablet 0   amLODipine (NORVASC) 10 MG tablet Take 1 tablet by mouth daily.     aspirin EC 81 MG EC tablet Take 1 tablet (81 mg total) by mouth daily. Swallow whole. 30 tablet 11   azelastine (ASTELIN) 0.1 % nasal spray Place 2 sprays into both nostrils 2  (two) times daily.     calcium acetate (PHOSLO) 667 MG capsule Take 1,334 mg by mouth 3 (three) times daily.     cetirizine (ZYRTEC) 10 MG tablet Take 1 tablet by mouth daily as needed for allergies.     cloNIDine (CATAPRES) 0.1 MG tablet Take 1 tablet (0.1 mg total) by mouth 2 (two) times daily. 60 tablet 0   [employer]S D3 25 MCG (1000 UT) capsule Take 1,000 Units by mouth daily.     DROXIA 300 MG capsule TAKE 1 CAPSULE (300 MG TOTAL) BY MOUTH DAILY. MAY TAKE WITH FOOD TO MINIMIZE GI SIDE EFFECTS. 30 capsule 0   furosemide (LASIX) 80 MG tablet Take 1 tablet (80 mg total) by mouth daily. 60 tablet 0   gabapentin (NEURONTIN) 100 MG capsule Take 1 capsule (100 mg total) by mouth 2 (two) times daily with breakfast and lunch. 60 capsule 0   gabapentin (NEURONTIN) 100 MG capsule Take 2 capsules (200 mg total) by mouth at bedtime. 60 capsule 0   glucose blood test strip Use once daily. Use as instructed.     HYDROcodone-acetaminophen (NORCO/VICODIN) 5-325 MG tablet Take 0.5-1 tablets by mouth 2 (two) times daily as needed for severe pain. 10 tablet 0   latanoprost (XALATAN) 0.005 % ophthalmic solution Place 1 drop into both eyes at bedtime.     levothyroxine (SYNTHROID) 88 MCG tablet Take 88 mcg by mouth daily. Take on an empty stomach 30 to 60 minutes  before breakfast     metoprolol tartrate (LOPRESSOR) 25 MG tablet Take 0.5 tablets (12.5 mg total) by mouth 2 (two) times daily. 60 tablet 0   multivitamin (RENA-VIT) TABS tablet Take 1 tablet by mouth at bedtime.  0   Nutritional Supplements (FEEDING SUPPLEMENT, NEPRO CARB STEADY,) LIQD Take 237 mLs by mouth 2 (two) times daily between meals.  0   pantoprazole (PROTONIX) 40 MG tablet Take 1 tablet (40 mg total) by mouth daily. 30 tablet 2   polyethylene glycol (MIRALAX / GLYCOLAX) 17 g packet Take 17 g by mouth daily. 14 each 0   rOPINIRole (REQUIP) 0.5 MG tablet Take 0.5 mg by mouth at bedtime.     rosuvastatin (CRESTOR) 20 MG tablet Take 20 mg by mouth daily.      timolol (TIMOPTIC-XR) 0.5 % ophthalmic gel-forming Place 1 drop into both eyes every morning.     No current facility-administered medications on file prior to visit.    There are no Patient Instructions on file for this visit. No follow-ups on file.   Georgiana Spinner, NP

## 2023-05-21 ENCOUNTER — Telehealth (INDEPENDENT_AMBULATORY_CARE_PROVIDER_SITE_OTHER): Payer: Self-pay

## 2023-05-21 NOTE — Telephone Encounter (Signed)
Spoke with the patient and she is scheduled with Dr. Gilda Crease on 05/25/23 for a left brachial cephalic AV fistula at [facility]. Pre-op is on 05/23/23 at 2:00 pm at the [facility]. Pre-surgical instructions were discussed and patient stated she wrote them down.

## 2023-05-22 ENCOUNTER — Other Ambulatory Visit (INDEPENDENT_AMBULATORY_CARE_PROVIDER_SITE_OTHER): Payer: Self-pay | Admitting: Nurse Practitioner

## 2023-05-22 DIAGNOSIS — N186 End stage renal disease: Secondary | ICD-10-CM

## 2023-05-23 ENCOUNTER — Encounter: Payer: Self-pay | Admitting: Vascular Surgery

## 2023-05-23 ENCOUNTER — Encounter: Payer: Self-pay | Admitting: Urgent Care

## 2023-05-23 ENCOUNTER — Encounter
Admission: RE | Admit: 2023-05-23 | Discharge: 2023-05-23 | Disposition: A | Discharge status: Home / Self Care | Payer: Medicare Other | Source: Ambulatory Visit | Attending: Vascular Surgery | Admitting: Vascular Surgery

## 2023-05-23 ENCOUNTER — Other Ambulatory Visit (INDEPENDENT_AMBULATORY_CARE_PROVIDER_SITE_OTHER): Payer: Self-pay | Admitting: Nurse Practitioner

## 2023-05-23 VITALS — BP 117/56 | HR 58 | Temp 97.5°F | Resp 16 | Ht 60.0 in | Wt 127.0 lb

## 2023-05-23 DIAGNOSIS — Z01818 Encounter for other preprocedural examination: Secondary | ICD-10-CM | POA: Diagnosis present

## 2023-05-23 DIAGNOSIS — N186 End stage renal disease: Secondary | ICD-10-CM | POA: Insufficient documentation

## 2023-05-23 DIAGNOSIS — Z01812 Encounter for preprocedural laboratory examination: Secondary | ICD-10-CM

## 2023-05-23 HISTORY — DX: Atherosclerosis of aorta: I70.0

## 2023-05-23 HISTORY — DX: Thrombocytosis, unspecified: D75.839

## 2023-05-23 HISTORY — DX: Pure hypercholesterolemia, unspecified: E78.00

## 2023-05-23 HISTORY — DX: Restless legs syndrome: G25.81

## 2023-05-23 HISTORY — DX: Spinal stenosis, lumbar region with neurogenic claudication: M48.062

## 2023-05-23 HISTORY — DX: Atherosclerotic heart disease of native coronary artery with unstable angina pectoris: I25.110

## 2023-05-23 HISTORY — DX: Anxiety disorder, unspecified: F41.9

## 2023-05-23 HISTORY — DX: Type 2 diabetes mellitus with diabetic chronic kidney disease: E11.22

## 2023-05-23 LAB — TYPE AND SCREEN
ABO/RH(D): O POS
Antibody Screen: NEGATIVE

## 2023-05-23 LAB — BASIC METABOLIC PANEL
Anion gap: 13 (ref 5–15)
BUN: 21 mg/dL (ref 8–23)
CO2: 26 mmol/L (ref 22–32)
Calcium: 8.5 mg/dL — ABNORMAL LOW (ref 8.9–10.3)
Chloride: 92 mmol/L — ABNORMAL LOW (ref 98–111)
Creatinine, Ser: 5.78 mg/dL — ABNORMAL HIGH (ref 0.44–1.00)
GFR, Estimated: 6 mL/min — ABNORMAL LOW (ref >60)
Glucose, Bld: 157 mg/dL — ABNORMAL HIGH (ref 70–99)
Potassium: 3 mmol/L — ABNORMAL LOW (ref 3.5–5.1)
Sodium: 131 mmol/L — ABNORMAL LOW (ref 135–145)

## 2023-05-23 LAB — CBC WITH DIFFERENTIAL/PLATELET
Abs Immature Granulocytes: 0.13 10*3/uL — ABNORMAL HIGH (ref 0.00–0.07)
Basophils Absolute: 0.1 10*3/uL (ref 0.0–0.1)
Basophils Relative: 1 %
Eosinophils Absolute: 0.3 10*3/uL (ref 0.0–0.5)
Eosinophils Relative: 2 %
HCT: 40.2 % (ref 36.0–46.0)
Hemoglobin: 12.5 g/dL (ref 12.0–15.0)
Immature Granulocytes: 1 %
Lymphocytes Relative: 16 %
Lymphs Abs: 2.2 10*3/uL (ref 0.7–4.0)
MCH: 30.6 pg (ref 26.0–34.0)
MCHC: 31.1 g/dL (ref 30.0–36.0)
MCV: 98.3 fL (ref 80.0–100.0)
Monocytes Absolute: 1 10*3/uL (ref 0.1–1.0)
Monocytes Relative: 7 %
Neutro Abs: 10.2 10*3/uL — ABNORMAL HIGH (ref 1.7–7.7)
Neutrophils Relative %: 73 %
Platelets: 508 10*3/uL — ABNORMAL HIGH (ref 150–400)
RBC: 4.09 MIL/uL (ref 3.87–5.11)
RDW: 19.5 % — ABNORMAL HIGH (ref 11.5–15.5)
Smear Review: NORMAL
WBC: 14 10*3/uL — ABNORMAL HIGH (ref 4.0–10.5)
nRBC: 0 % (ref 0.0–0.2)

## 2023-05-23 NOTE — Progress Notes (Signed)
  [facility] Perioperative Services: Pre-Admission/Anesthesia Testing  Abnormal Lab Notification   Date: 05/23/23  Name: Cassandra [name] MRN:   [id number]  Re: Abnormal labs noted during PAT appointment   Notified:  Provider Name Provider Role Notification Mode  Sheppard Plumber, [facility] Vascular Surgery APP Routed and/or faxed via [facility]   Clinical Information and Notes:  ABNORMAL LAB VALUE(S): Lab Results  Component Value Date   K 3.0 (L) 05/23/2023   Cassandra [name] is scheduled for LEFT ARTERIOVENOUS (AV) FISTULA CREATION (BRACHIALCEPHALIC on 05/25/2023.   In review of her medication reconciliation, it is noted that the patient is NOT taking any type of prescribed diuretic medications. Patient is on hemodialysis with a Tuesday, Thursday, Saturday schedule.   Please note, in efforts to promote a safe and effective anesthetic course, per current guidelines/standards set by the [facility] anesthesia team, the minimal acceptable K+ level for the patient to proceed with general anesthesia is 3.0 mmol/L. With that being said, if the patient drops any lower, her procedure will need to be postponed until K+ is better optimized. Abnormal result is being forwarded to primary attending surgeon for review and consideration of optimization. Order placed to have K+ rechecked on the day of her procedure to ensure correction of the noted derangement.    [name], MSN, APRN, FNP-C, CEN [facility]  Peri-operative Services Nurse Practitioner Phone: [phone] Fax: [phone] 05/23/23 3:14 PM

## 2023-05-23 NOTE — Progress Notes (Signed)
Perioperative Services Pre-Admission/Anesthesia Testing   Date: 05/23/23 Name: Cassandra [name] MRN:   [id number]  Re: Consideration of preoperative prophylactic antibiotic change   Request sent to: Schnier, Latina Craver, MD (routed and/or faxed via [facility])  Planned Surgical Procedure(s):    Case: [id number] Date/Time: 05/25/23 6962   Procedure: ARTERIOVENOUS (AV) FISTULA CREATION (BRACHIALCEPHALIC) (Left)   Anesthesia type: General   Pre-op diagnosis: ESRD   Location: [facility] OR ROOM 08 / [facility] ORS FOR ANESTHESIA GROUP   Surgeons: Renford Dills, MD   Clinical Notes:  Patient has a documented allergy/intolerance to PCN  Advising that PCN has caused her to experience periorbital and perioral swelling in the past.   EMR review indicated that patient received PCN and/or cephalosporin in the past as follows: CEFTRIAXONE received between 03/05/2023 and 03/09/2023 (multiple doses) with no documented ADRs. Allergy section in EMR updated to reflect tolerance.    Screened as appropriate for cephalosporin use during medication reconciliation No immediate dysphagia, SOB, anaphylaxis symptoms. No severe rash involving mucous membranes or skin necrosis. No hospital admissions related to side effects of PCN/cephalosporin use.  No documented reaction to PCN or cephalosporin in the last 10 years.  Request:  As an evidence based approach to reducing the rate of incidence for post-operative SSI and the development of MDROs, could an agent that allows for narrower antimicrobial coverage for preoperative prophylaxis in this patient's upcoming surgical course be considered?   Currently ordered preoperative prophylactic ABX: vancomycin.   Specifically requesting change to cephalosporin (CEFAZOLIN).   Drug of choice for many procedures; it is the most widely studied antimicrobial agent with proven efficacy for antimicrobial prophylaxis.   Desirable duration of action, spectrum of activity against  organisms commonly encountered in surgery, and it has an excellent safety profile and low cost.   Active against streptococci, methicillin-susceptible staphylococci, and many gram-negative organisms.  Despite being a first-generation cephalosporin, cefazolin is structurally different than other cephalosporins. Cefazolin has two side chains (R1 and R2) whose structures do not match those of any other FDA-approved ?-lactams, including PCN.  True allergies to cefazolin are extremely rare (<1%) and are usually specific for its side chains, not the shared core ring.  Please communicate decision with me and I will change the orders in Epic as per your direction.   Things to consider: Many patients report that they were "allergic" to PCN earlier in life, however this does not translate into a true lifelong allergy. Patients can lose sensitivity to specific IgE antibodies over time if PCN is avoided ([name] & [name], 2019).  Penicillin allergies are reported by 8% to 15% of the [place] population, but up to 95% of these allergies do not correspond to a true allergy when tested [name] et al., 2022).   Cross-sensitivity between PCN and cephalosporins has been documented as being as high as 10%, however this estimation included data believed to have been collected in a setting where there was contamination. Newer data suggests that the prevalence of cross-sensitivity between PCN and cephalosporins is actually estimated to be closer to 1% (Hermanides et al., 2018).   Patients labeled as PCN allergic, whether they are truly allergic or not, have been found to have inferior outcomes in terms of rates of serious infection, and these patients tend to have longer hospital stays [facility] & [name], 2019).  Treatment related secondary infections, such as Clostridioides difficile, have been linked to the improper use of broad spectrum antibiotics in patients improperly labeled as PCN allergic ([name] & [name], 2019).  Anaphylaxis from cephalosporins is rare and the evidence suggests that there is no increased risk of an anaphylactic type reaction when cephalosporins are used in a PCN allergic patient ([name], 2006).  Citations: Hermanides J, Lemkes BA, Prins [name], [name]. Presumed ?-Lactam Allergy and Cross-reactivity in the Operating Theater: A Practical Approach. Anesthesiology. 2018 Aug;129(2):335-342. doi: 10.1097/ALN.0000000000002252. PMID: 53664403.  [name], R. S., & [name], P. L. (2019). Things We Do For No Reason: Failing to Question a Penicillin Allergy History. Journal of hospital medicine, 14(10), 903 497 8305. Advance online publication. airportbarriers.com  [name], M. E. (2006). Cephalosporins can be prescribed safely for penicillin-allergic patients. Journal of family medicine, 55(2), 106-112. Accessed: https://cdn.mdedge.com/files/s9fs-public/Document/September-2017/5502JFP_AppliedEvidence1.pdf  [name], & [name], D. R. (2022). Understanding Penicillin Allergy, Cross-reactivity, and Antibiotic Selection in the Preoperative Setting. The Journal of the American Academy of Orthopaedic Surgeons, 30(1), e1-e5. CallRank.tn   [name], MSN, APRN, FNP-C, CEN [facility]  Peri-operative Services Nurse Practitioner FAX: [phone] 05/23/23 3:29 PM

## 2023-05-23 NOTE — Patient Instructions (Signed)
Your procedure is scheduled on: Friday, August 30 Report to the Registration Desk on the 1st floor of the [facility] To find out your arrival time, please call [phone] between 1PM - 3PM on: Thursday, August 29 If your arrival time is 6:00 am, do not arrive before that time as the [facility] entrance doors do not open until 6:00 am.  REMEMBER: Instructions that are not followed completely may result in serious medical risk, up to and including death; or upon the discretion of your surgeon and anesthesiologist your surgery may need to be rescheduled.  Do not eat or drink after midnight the night before surgery.  No gum chewing or hard candies.  One week prior to surgery: starting today, August 28 Stop Anti-inflammatories (NSAIDS) such as Advil, Aleve, Ibuprofen, Motrin, Naproxen, Naprosyn and Aspirin based products such as Excedrin, Goody's Powder, BC Powder. Stop ANY OVER THE COUNTER supplements until after surgery. You may however, continue to take Tylenol if needed for pain up until the day of surgery.  Continue taking the aspirin.  Continue taking all prescribed medications   TAKE ONLY THESE MEDICATIONS THE MORNING OF SURGERY WITH A SIP OF WATER:  Alprazolam (Xanax) if needed for anxiety Amlodipine Droxia Gabapentin Levothyroxine Rosuvastatin Timolol eye drops  No Alcohol for 24 hours before or after surgery.  No Smoking including e-cigarettes for 24 hours before surgery.  No chewable tobacco products for at least 6 hours before surgery.  No nicotine patches on the day of surgery.  On the morning of surgery brush your teeth with toothpaste and water, you may rinse your mouth with mouthwash if you wish. Do not swallow any toothpaste or mouthwash.  Use CHG wipes as directed on instruction sheet.  Do not wear jewelry, make-up, hairpins, clips or nail polish.  Do not wear lotions, powders, or perfumes.   Do not shave body hair from the neck down 48 hours before  surgery.  Contact lenses, hearing aids and dentures may not be worn into surgery.  Do not bring valuables to the hospital. [facility] is not responsible for any missing/lost belongings or valuables.   Notify your doctor if there is any change in your medical condition (cold, fever, infection).  Wear comfortable clothing (specific to your surgery type) to the hospital.  After surgery, you can help prevent lung complications by doing breathing exercises.  Take deep breaths and cough every 1-2 hours.   If you are being discharged the day of surgery, you will not be allowed to drive home. You will need a responsible individual to drive you home and stay with you for 24 hours after surgery.   If you are taking public transportation, you will need to have a responsible individual with you.  Please call the Pre-admissions Testing Dept. at [phone] if you have any questions about these instructions.  Surgery Visitation Policy:  Patients having surgery or a procedure may have two visitors.  Children under the age of 55 must have an adult with them who is not the patient.  Preparing the Skin Before Surgery     To help prevent the risk of infection at your surgical site, we are now providing you with rinse-free Sage 2% Chlorhexidine Gluconate (CHG) disposable wipes.  Chlorhexidine Gluconate (CHG) Soap  o An antiseptic cleaner that kills germs and bonds with the skin to continue killing germs even after washing  o Used for showering the night before surgery and morning of surgery  The night before surgery:  Shower or bathe with warm water. Do not apply perfume, lotions, powders. Wait one hour after shower. Skin should be dry and cool. Open Sage wipe package - use 6 disposable cloths. Wipe body using one cloth for the right arm, one cloth for the left arm, one cloth for the right leg, one cloth for the left leg, one cloth for the chest/abdomen area, and one cloth for the back. Do  not use on open wounds or sores. Do not use on face or genitals (private parts). If you are breast feeding, do not use on breasts. 5. Do not rinse, allow to dry. 6. Skin may feel "tacky" for several minutes. 7. Dress in clean clothes. 8. Place clean sheets on your bed and do not sleep with pets.  REPEAT ABOVE ON THE MORNING OF SURGERY BEFORE ARRIVING TO THE HOSPITAL.

## 2023-05-24 MED ORDER — CHLORHEXIDINE GLUCONATE CLOTH 2 % EX PADS: 6.0000 | MEDICATED_PAD | Freq: Once | CUTANEOUS | Status: DC

## 2023-05-24 MED ORDER — ORAL CARE MOUTH RINSE: 15.0000 mL | Freq: Once | OROMUCOSAL | Status: AC

## 2023-05-24 MED ORDER — CHLORHEXIDINE GLUCONATE CLOTH 2 % EX PADS
6.0000 | MEDICATED_PAD | Freq: Once | CUTANEOUS | Status: AC
  Administered 2023-05-25: 6 via TOPICAL

## 2023-05-24 MED ORDER — SODIUM CHLORIDE 0.9 % IV SOLN: INTRAVENOUS | Status: DC

## 2023-05-24 MED ORDER — CHLORHEXIDINE GLUCONATE 0.12 % MT SOLN
15.0000 mL | Freq: Once | OROMUCOSAL | Status: AC
  Administered 2023-05-25: 15 mL via OROMUCOSAL

## 2023-05-24 MED ORDER — FAMOTIDINE 20 MG PO TABS
20.0000 mg | ORAL_TABLET | Freq: Once | ORAL | Status: AC
  Administered 2023-05-25: 20 mg via ORAL

## 2023-05-25 ENCOUNTER — Other Ambulatory Visit: Payer: Self-pay

## 2023-05-25 ENCOUNTER — Ambulatory Visit
Admission: RE | Admit: 2023-05-25 | Discharge: 2023-05-25 | Disposition: A | Discharge status: Home / Self Care | Payer: Medicare Other | Attending: Vascular Surgery | Admitting: Vascular Surgery

## 2023-05-25 ENCOUNTER — Ambulatory Visit: Payer: Medicare Other | Admitting: Urgent Care

## 2023-05-25 ENCOUNTER — Encounter
Admission: RE | Disposition: A | Discharge status: Home / Self Care | Payer: Self-pay | Source: Home / Self Care | Attending: Vascular Surgery

## 2023-05-25 ENCOUNTER — Encounter: Payer: Self-pay | Admitting: Vascular Surgery

## 2023-05-25 DIAGNOSIS — G2581 Restless legs syndrome: Secondary | ICD-10-CM | POA: Insufficient documentation

## 2023-05-25 DIAGNOSIS — I120 Hypertensive chronic kidney disease with stage 5 chronic kidney disease or end stage renal disease: Secondary | ICD-10-CM | POA: Insufficient documentation

## 2023-05-25 DIAGNOSIS — H409 Unspecified glaucoma: Secondary | ICD-10-CM | POA: Diagnosis not present

## 2023-05-25 DIAGNOSIS — M1990 Unspecified osteoarthritis, unspecified site: Secondary | ICD-10-CM | POA: Diagnosis not present

## 2023-05-25 DIAGNOSIS — Z7982 Long term (current) use of aspirin: Secondary | ICD-10-CM | POA: Insufficient documentation

## 2023-05-25 DIAGNOSIS — R600 Localized edema: Secondary | ICD-10-CM | POA: Diagnosis not present

## 2023-05-25 DIAGNOSIS — I25119 Atherosclerotic heart disease of native coronary artery with unspecified angina pectoris: Secondary | ICD-10-CM | POA: Insufficient documentation

## 2023-05-25 DIAGNOSIS — Z992 Dependence on renal dialysis: Secondary | ICD-10-CM | POA: Insufficient documentation

## 2023-05-25 DIAGNOSIS — Z87891 Personal history of nicotine dependence: Secondary | ICD-10-CM | POA: Diagnosis not present

## 2023-05-25 DIAGNOSIS — Z79899 Other long term (current) drug therapy: Secondary | ICD-10-CM | POA: Insufficient documentation

## 2023-05-25 DIAGNOSIS — N186 End stage renal disease: Secondary | ICD-10-CM | POA: Diagnosis not present

## 2023-05-25 DIAGNOSIS — E039 Hypothyroidism, unspecified: Secondary | ICD-10-CM | POA: Diagnosis not present

## 2023-05-25 DIAGNOSIS — E1122 Type 2 diabetes mellitus with diabetic chronic kidney disease: Secondary | ICD-10-CM | POA: Insufficient documentation

## 2023-05-25 DIAGNOSIS — D631 Anemia in chronic kidney disease: Secondary | ICD-10-CM | POA: Insufficient documentation

## 2023-05-25 DIAGNOSIS — E782 Mixed hyperlipidemia: Secondary | ICD-10-CM | POA: Insufficient documentation

## 2023-05-25 DIAGNOSIS — E7800 Pure hypercholesterolemia, unspecified: Secondary | ICD-10-CM | POA: Insufficient documentation

## 2023-05-25 DIAGNOSIS — I700 Atherosclerosis of aorta: Secondary | ICD-10-CM | POA: Insufficient documentation

## 2023-05-25 DIAGNOSIS — E785 Hyperlipidemia, unspecified: Secondary | ICD-10-CM | POA: Diagnosis not present

## 2023-05-25 DIAGNOSIS — F419 Anxiety disorder, unspecified: Secondary | ICD-10-CM | POA: Diagnosis not present

## 2023-05-25 DIAGNOSIS — R2689 Other abnormalities of gait and mobility: Secondary | ICD-10-CM | POA: Insufficient documentation

## 2023-05-25 DIAGNOSIS — Z01812 Encounter for preprocedural laboratory examination: Secondary | ICD-10-CM

## 2023-05-25 DIAGNOSIS — Z955 Presence of coronary angioplasty implant and graft: Secondary | ICD-10-CM | POA: Insufficient documentation

## 2023-05-25 HISTORY — DX: Dependence on renal dialysis: N18.6

## 2023-05-25 HISTORY — DX: End stage renal disease: N18.6

## 2023-05-25 HISTORY — DX: End stage renal disease: Z99.2

## 2023-05-25 HISTORY — PX: AV FISTULA PLACEMENT: SHX1204

## 2023-05-25 LAB — POCT I-STAT, CHEM 8
BUN: 14 mg/dL (ref 8–23)
Calcium, Ion: 1.11 mmol/L — ABNORMAL LOW (ref 1.15–1.40)
Chloride: 96 mmol/L — ABNORMAL LOW (ref 98–111)
Creatinine, Ser: 7.8 mg/dL — ABNORMAL HIGH (ref 0.44–1.00)
Glucose, Bld: 96 mg/dL (ref 70–99)
HCT: 46 % (ref 36.0–46.0)
Hemoglobin: 15.6 g/dL — ABNORMAL HIGH (ref 12.0–15.0)
Potassium: 3.2 mmol/L — ABNORMAL LOW (ref 3.5–5.1)
Sodium: 139 mmol/L (ref 135–145)
TCO2: 30 mmol/L (ref 22–32)

## 2023-05-25 LAB — GLUCOSE, CAPILLARY: Glucose-Capillary: 81 mg/dL (ref 70–99)

## 2023-05-25 SURGERY — ARTERIOVENOUS (AV) FISTULA CREATION
Anesthesia: General | Laterality: Left

## 2023-05-25 MED ORDER — CEFAZOLIN SODIUM-DEXTROSE 2-4 GM/100ML-% IV SOLN
INTRAVENOUS | Status: AC
  Filled 2023-05-25: qty 100

## 2023-05-25 MED ORDER — HEPARIN SODIUM (PORCINE) 5000 UNIT/ML IJ SOLN
INTRAMUSCULAR | Status: AC
  Filled 2023-05-25: qty 1

## 2023-05-25 MED ORDER — ACETAMINOPHEN 10 MG/ML IV SOLN
INTRAVENOUS | Status: AC
  Filled 2023-05-25: qty 100

## 2023-05-25 MED ORDER — CEFAZOLIN SODIUM-DEXTROSE 2-4 GM/100ML-% IV SOLN
2.0000 g | INTRAVENOUS | Status: AC
  Administered 2023-05-25: 2 g via INTRAVENOUS

## 2023-05-25 MED ORDER — ACETAMINOPHEN 10 MG/ML IV SOLN
INTRAVENOUS | Status: DC | PRN
  Administered 2023-05-25: 1000 mg via INTRAVENOUS

## 2023-05-25 MED ORDER — FENTANYL CITRATE (PF) 100 MCG/2ML IJ SOLN
INTRAMUSCULAR | Status: DC | PRN
  Administered 2023-05-25: 50 ug via INTRAVENOUS
  Administered 2023-05-25: 25 ug via INTRAVENOUS

## 2023-05-25 MED ORDER — GLYCOPYRROLATE 0.2 MG/ML IJ SOLN
INTRAMUSCULAR | Status: AC
  Filled 2023-05-25: qty 1

## 2023-05-25 MED ORDER — PROPOFOL 10 MG/ML IV BOLUS
INTRAVENOUS | Status: DC | PRN
  Administered 2023-05-25: 120 mg via INTRAVENOUS
  Administered 2023-05-25: 40 mg via INTRAVENOUS

## 2023-05-25 MED ORDER — ONDANSETRON HCL 4 MG/2ML IJ SOLN
INTRAMUSCULAR | Status: AC
  Filled 2023-05-25: qty 2

## 2023-05-25 MED ORDER — PHENYLEPHRINE 80 MCG/ML (10ML) SYRINGE FOR IV PUSH (FOR BLOOD PRESSURE SUPPORT)
PREFILLED_SYRINGE | INTRAVENOUS | Status: AC
  Filled 2023-05-25: qty 10

## 2023-05-25 MED ORDER — PROPOFOL 10 MG/ML IV BOLUS
INTRAVENOUS | Status: AC
  Filled 2023-05-25: qty 20

## 2023-05-25 MED ORDER — ONDANSETRON HCL 4 MG/2ML IJ SOLN
INTRAMUSCULAR | Status: DC | PRN
  Administered 2023-05-25: 4 mg via INTRAVENOUS

## 2023-05-25 MED ORDER — SODIUM CHLORIDE 0.9 % IV SOLN
INTRAVENOUS | Status: DC | PRN
  Administered 2023-05-25: 501 mL via SURGICAL_CAVITY

## 2023-05-25 MED ORDER — BUPIVACAINE HCL (PF) 0.5 % IJ SOLN
INTRAMUSCULAR | Status: AC
  Filled 2023-05-25: qty 30

## 2023-05-25 MED ORDER — TRAMADOL HCL 50 MG PO TABS: 50.0000 mg | ORAL_TABLET | Freq: Four times a day (QID) | ORAL | 0 refills | Status: DC | PRN

## 2023-05-25 MED ORDER — BUPIVACAINE LIPOSOME 1.3 % IJ SUSP
INTRAMUSCULAR | Status: DC | PRN
  Administered 2023-05-25: 15 mL

## 2023-05-25 MED ORDER — GLYCOPYRROLATE 0.2 MG/ML IJ SOLN
INTRAMUSCULAR | Status: DC | PRN
  Administered 2023-05-25: .2 mg via INTRAVENOUS

## 2023-05-25 MED ORDER — LIDOCAINE HCL (CARDIAC) PF 100 MG/5ML IV SOSY
PREFILLED_SYRINGE | INTRAVENOUS | Status: DC | PRN
  Administered 2023-05-25: 50 mg via INTRAVENOUS

## 2023-05-25 MED ORDER — EPHEDRINE 5 MG/ML INJ
INTRAVENOUS | Status: AC
  Filled 2023-05-25: qty 5

## 2023-05-25 MED ORDER — FENTANYL CITRATE (PF) 100 MCG/2ML IJ SOLN
INTRAMUSCULAR | Status: AC
  Filled 2023-05-25: qty 2

## 2023-05-25 MED ORDER — FAMOTIDINE 20 MG PO TABS
ORAL_TABLET | ORAL | Status: AC
  Filled 2023-05-25: qty 1

## 2023-05-25 MED ORDER — PROMETHAZINE HCL 25 MG/ML IJ SOLN: 6.2500 mg | INTRAMUSCULAR | Status: DC | PRN

## 2023-05-25 MED ORDER — DROPERIDOL 2.5 MG/ML IJ SOLN: 0.6250 mg | Freq: Once | INTRAMUSCULAR | Status: DC | PRN

## 2023-05-25 MED ORDER — BUPIVACAINE LIPOSOME 1.3 % IJ SUSP
INTRAMUSCULAR | Status: AC
  Filled 2023-05-25: qty 20

## 2023-05-25 MED ORDER — CHLORHEXIDINE GLUCONATE 0.12 % MT SOLN
OROMUCOSAL | Status: AC
  Filled 2023-05-25: qty 15

## 2023-05-25 MED ORDER — DEXAMETHASONE SODIUM PHOSPHATE 10 MG/ML IJ SOLN
INTRAMUSCULAR | Status: AC
  Filled 2023-05-25: qty 1

## 2023-05-25 MED ORDER — LIDOCAINE HCL (PF) 2 % IJ SOLN
INTRAMUSCULAR | Status: AC
  Filled 2023-05-25: qty 5

## 2023-05-25 MED ORDER — PHENYLEPHRINE 80 MCG/ML (10ML) SYRINGE FOR IV PUSH (FOR BLOOD PRESSURE SUPPORT)
PREFILLED_SYRINGE | INTRAVENOUS | Status: DC | PRN
  Administered 2023-05-25 (×5): 80 ug via INTRAVENOUS

## 2023-05-25 MED ORDER — ACETAMINOPHEN 10 MG/ML IV SOLN: 1000.0000 mg | Freq: Once | INTRAVENOUS | Status: DC | PRN

## 2023-05-25 MED ORDER — EPHEDRINE SULFATE (PRESSORS) 50 MG/ML IJ SOLN
INTRAMUSCULAR | Status: DC | PRN
  Administered 2023-05-25: 5 mg via INTRAVENOUS
  Administered 2023-05-25 (×2): 10 mg via INTRAVENOUS

## 2023-05-25 MED ORDER — FENTANYL CITRATE (PF) 100 MCG/2ML IJ SOLN: 25.0000 ug | INTRAMUSCULAR | Status: DC | PRN

## 2023-05-25 MED ORDER — DEXAMETHASONE SODIUM PHOSPHATE 10 MG/ML IJ SOLN
INTRAMUSCULAR | Status: DC | PRN
  Administered 2023-05-25: 5 mg via INTRAVENOUS

## 2023-05-25 SURGICAL SUPPLY — 49 items
ADH SKN CLS APL DERMABOND .7 (GAUZE/BANDAGES/DRESSINGS) ×1
APL PRP STRL LF DISP 70% ISPRP (MISCELLANEOUS) ×1
BAG DECANTER FOR FLEXI CONT (MISCELLANEOUS) ×1 IMPLANT
BLADE SURG SZ11 CARB STEEL (BLADE) ×1 IMPLANT
BOOT SUTURE VASCULAR YLW (MISCELLANEOUS) ×1
BRUSH SCRUB EZ 4% CHG (MISCELLANEOUS) ×1 IMPLANT
CHLORAPREP W/TINT 26 (MISCELLANEOUS) ×1 IMPLANT
CLAMP SUTURE YELLOW 5 PAIRS (MISCELLANEOUS) ×1 IMPLANT
DERMABOND ADVANCED .7 DNX12 (GAUZE/BANDAGES/DRESSINGS) ×1 IMPLANT
DRESSING SURGICEL FIBRLLR 1X2 (HEMOSTASIS) ×1 IMPLANT
DRSG SURGICEL FIBRILLAR 1X2 (HEMOSTASIS) ×1
ELECT CAUTERY BLADE 6.4 (BLADE) ×1 IMPLANT
ELECT REM PT RETURN 9FT ADLT (ELECTROSURGICAL) ×1
ELECTRODE REM PT RTRN 9FT ADLT (ELECTROSURGICAL) ×1 IMPLANT
GLOVE BIO SURGEON STRL SZ7 (GLOVE) ×1 IMPLANT
GLOVE SURG SYN 8.0 (GLOVE) ×1 IMPLANT
GLOVE SURG SYN 8.0 PF PI (GLOVE) ×1 IMPLANT
GOWN STRL REUS W/ TWL LRG LVL3 (GOWN DISPOSABLE) ×2 IMPLANT
GOWN STRL REUS W/ TWL XL LVL3 (GOWN DISPOSABLE) ×1 IMPLANT
GOWN STRL REUS W/TWL LRG LVL3 (GOWN DISPOSABLE) ×2
GOWN STRL REUS W/TWL XL LVL3 (GOWN DISPOSABLE) ×1
IV NS 500ML (IV SOLUTION) ×1
IV NS 500ML BAXH (IV SOLUTION) ×1 IMPLANT
KIT TURNOVER KIT A (KITS) ×1 IMPLANT
LABEL OR SOLS (LABEL) ×1 IMPLANT
LOOP VESSEL MAXI 1X406 RED (MISCELLANEOUS) ×1 IMPLANT
LOOP VESSEL MINI 0.8X406 BLUE (MISCELLANEOUS) ×2 IMPLANT
MANIFOLD NEPTUNE II (INSTRUMENTS) ×1 IMPLANT
NDL FILTER BLUNT 18X1 1/2 (NEEDLE) ×1 IMPLANT
NEEDLE FILTER BLUNT 18X1 1/2 (NEEDLE) ×1 IMPLANT
PACK EXTREMITY ARMC (MISCELLANEOUS) ×1 IMPLANT
PAD PREP OB/GYN DISP 24X41 (PERSONAL CARE ITEMS) ×1 IMPLANT
STOCKINETTE 48X4 2 PLY STRL (GAUZE/BANDAGES/DRESSINGS) ×1 IMPLANT
STOCKINETTE STRL 4IN 9604848 (GAUZE/BANDAGES/DRESSINGS) ×1 IMPLANT
SUT MNCRL+ 5-0 UNDYED PC-3 (SUTURE) ×1 IMPLANT
SUT PROLENE 6 0 BV (SUTURE) ×4 IMPLANT
SUT SILK 2 0 (SUTURE) ×1
SUT SILK 2-0 18XBRD TIE 12 (SUTURE) ×1 IMPLANT
SUT SILK 3 0 (SUTURE) ×1
SUT SILK 3-0 18XBRD TIE 12 (SUTURE) ×1 IMPLANT
SUT SILK 4 0 (SUTURE) ×1
SUT SILK 4-0 18XBRD TIE 12 (SUTURE) ×1 IMPLANT
SUT VIC AB 3-0 SH 27 (SUTURE) ×1
SUT VIC AB 3-0 SH 27X BRD (SUTURE) ×1 IMPLANT
SYR 20ML LL LF (SYRINGE) ×1 IMPLANT
SYR 3ML LL SCALE MARK (SYRINGE) ×1 IMPLANT
TAG SUTURE CLAMP YLW 5PR (MISCELLANEOUS) ×1
TRAP FLUID SMOKE EVACUATOR (MISCELLANEOUS) ×1 IMPLANT
WATER STERILE IRR 500ML POUR (IV SOLUTION) ×1 IMPLANT

## 2023-05-25 NOTE — Anesthesia Postprocedure Evaluation (Signed)
Anesthesia Post Note  Patient: Cassandra [name]  Procedure(s) Performed: ARTERIOVENOUS (AV) FISTULA CREATION (BRACHIALCEPHALIC) (Left)  Patient location during evaluation: PACU Anesthesia Type: General Level of consciousness: awake and alert Pain management: pain level controlled Vital Signs Assessment: post-procedure vital signs reviewed and stable Respiratory status: spontaneous breathing, nonlabored ventilation, respiratory function stable and patient connected to nasal cannula oxygen Cardiovascular status: blood pressure returned to baseline and stable Postop Assessment: no apparent nausea or vomiting Anesthetic complications: no   No notable events documented.   Last Vitals:  Vitals:   05/25/23 1157 05/25/23 1200  BP: 115/68 (!) 119/102  Pulse: 70 71  Resp: (!) 7 16  Temp: (!) 36.3 C   SpO2: 95% 93%    Last Pain:  Vitals:   05/25/23 0854  TempSrc: Temporal  PainSc: 0-No pain                 Yevette Edwards

## 2023-05-25 NOTE — Anesthesia Procedure Notes (Signed)
Procedure Name: LMA Insertion Date/Time: 05/25/2023 10:10 AM  Performed by: Lily Lovings, CRNAPre-anesthesia Checklist: Patient identified, Patient being monitored, Timeout performed, Emergency Drugs available and Suction available Patient Re-evaluated:Patient Re-evaluated prior to induction Oxygen Delivery Method: Circle system utilized Preoxygenation: Pre-oxygenation with 100% oxygen Induction Type: IV induction Ventilation: Mask ventilation without difficulty LMA: LMA inserted LMA Size: 4.0 Tube type: Oral Number of attempts: 1 Placement Confirmation: positive ETCO2 and breath sounds checked- equal and bilateral Tube secured with: Tape Dental Injury: Teeth and Oropharynx as per pre-operative assessment

## 2023-05-25 NOTE — Transfer of Care (Signed)
Immediate Anesthesia Transfer of Care Note  Patient: Cassandra [name]  Procedure(s) Performed: ARTERIOVENOUS (AV) FISTULA CREATION (BRACHIALCEPHALIC) (Left)  Patient Location: PACU  Anesthesia Type:General  Level of Consciousness: drowsy and patient cooperative  Airway & Oxygen Therapy: Patient Spontanous Breathing and Patient connected to nasal cannula oxygen  Post-op Assessment: Report given to RN and Patient moving all extremities X 4  Post vital signs: Reviewed and stable  Last Vitals:  Vitals Value Taken Time  BP    Temp    Pulse 75 05/25/23 1158  Resp 15 05/25/23 1158  SpO2 93 % 05/25/23 1158  Vitals shown include unfiled device data.  Last Pain:  Vitals:   05/25/23 0854  TempSrc: Temporal  PainSc: 0-No pain         Complications: No notable events documented.

## 2023-05-25 NOTE — Discharge Instructions (Signed)

## 2023-05-25 NOTE — Interval H&P Note (Signed)
History and Physical Interval Note:  05/25/2023 9:38 AM  Cassandra [name]  has presented today for surgery, with the diagnosis of ESRD.  The various methods of treatment have been discussed with the patient and family. After consideration of risks, benefits and other options for treatment, the patient has consented to  Procedure(s): ARTERIOVENOUS (AV) FISTULA CREATION (BRACHIALCEPHALIC) (Left) as a surgical intervention.  The patient's history has been reviewed, patient examined, no change in status, stable for surgery.  I have reviewed the patient's chart and labs.  Questions were answered to the patient's satisfaction.     Levora Dredge

## 2023-05-25 NOTE — Anesthesia Preprocedure Evaluation (Signed)
Anesthesia Evaluation  Patient identified by MRN, date of birth, ID band Patient awake    Reviewed: Allergy & Precautions, H&P , NPO status , Patient's Chart, lab work & pertinent test results, reviewed documented beta blocker date and time   Airway Mallampati: II  TM Distance: >3 FB Neck ROM: full    Dental  (+) Teeth Intact   Pulmonary pneumonia, resolved, former smoker   Pulmonary exam normal        Cardiovascular Exercise Tolerance: Poor hypertension, On Medications + angina with exertion + CAD  Normal cardiovascular exam Rhythm:regular Rate:Normal     Neuro/Psych   Anxiety      Neuromuscular disease CVA  negative psych ROS   GI/Hepatic negative GI ROS, Neg liver ROS,,,  Endo/Other  diabetesHypothyroidism    Renal/GU ESRF and DialysisRenal disease  negative genitourinary   Musculoskeletal   Abdominal   Peds  Hematology  (+) Blood dyscrasia, anemia   Anesthesia Other Findings Past Medical History: No date: Anxiety No date: Aortic atherosclerosis (HCC) No date: Arthritis     Comment:  osteoarthritis in hands, knees ( R torn cartilage, L               meniscus removed)  No date: Atherosclerosis of native coronary artery of native heart  with unstable angina pectoris (HCC) 1999: CAD (coronary artery disease)     Comment:  a.) s/p PCI (details unkown) in [place] in 1999 11/29/2014: Campylobacter antigen positive     Comment:  a.) presented [facility] ED with N/V/D; stool culture (+) No date: ESRD on hemodialysis (HCC)     Comment:  a.) T-Th-Sat No date: Glaucoma No date: Hypercholesterolemia No date: Hyperlipidemia No date: Hypertension No date: Hypothyroidism No date: Lumbar stenosis with neurogenic claudication 2010: Perforation of colon as colonoscopy complication (HCC)     Comment:  a.) required short term colostomy with subsequent take               down/reversal No date: RLS (restless legs  syndrome) No date: Thrombocytosis No date: Type 2 diabetes mellitus with chronic kidney disease (HCC) Past Surgical History: No date: ABDOMINAL HYSTERECTOMY     Comment:  left ovary removed No date: BLEPHAROPLASTY; Bilateral No date: CATARACT EXTRACTION W/ INTRAOCULAR LENS  IMPLANT, BILATERAL 2010: COLONOSCOPY 03/2009: COLOSTOMY     Comment:  perforated colon from colonoscopy 08/2009: COLOSTOMY REVERSAL 1999: CORONARY ANGIOPLASTY WITH STENT PLACEMENT 03/08/2023: DIALYSIS/PERMA CATHETER INSERTION; N/A     Comment:  Procedure: DIALYSIS/PERMA CATHETER INSERTION;  Surgeon:               Annice Needy, MD;  Location: [facility] INVASIVE CV LAB;                Service: Cardiovascular;  Laterality: N/A; No date: FOOT SURGERY; Bilateral     Comment:  toes correction No date: KNEE ARTHROSCOPY; Right     Comment:  torn cartilage 01/2012: KNEE ARTHROSCOPY W/ MENISCECTOMY; Left 03/05/2023: TEMPORARY DIALYSIS CATHETER; N/A     Comment:  Procedure: TEMPORARY DIALYSIS CATHETER;  Surgeon: Annice Needy, MD;  Location: [facility] INVASIVE CV LAB;  Service:               Cardiovascular;  Laterality: N/A;   Reproductive/Obstetrics negative OB ROS  Anesthesia Physical Anesthesia Plan  ASA: 3  Anesthesia Plan: General LMA   Post-op Pain Management:    Induction:   PONV Risk Score and Plan: 4 or greater  Airway Management Planned:   Additional Equipment:   Intra-op Plan:   Post-operative Plan:   Informed Consent: I have reviewed the patients History and Physical, chart, labs and discussed the procedure including the risks, benefits and alternatives for the proposed anesthesia with the patient or authorized representative who has indicated his/her understanding and acceptance.     Dental Advisory Given  Plan Discussed with: CRNA  Anesthesia Plan Comments:        Anesthesia Quick Evaluation

## 2023-05-25 NOTE — Op Note (Signed)
     OPERATIVE NOTE   PROCEDURE: left brachial cephalic arteriovenous fistula placement  PRE-OPERATIVE DIAGNOSIS: End Stage Renal Disease  POST-OPERATIVE DIAGNOSIS: End Stage Renal Disease  SURGEON: Earl Lites Nataleah Scioneaux  ASSISTANT(S): Rolla Plate, NP  ANESTHESIA: general  ESTIMATED BLOOD LOSS: <50 cc  FINDING(S): 4.5 mm cephalic vein  SPECIMEN(S):  none  INDICATIONS:   Cassandra [name] is a 89 y.o. female who presents with end stage renal disease.  The patient is scheduled for left brachiocephalic arteriovenous fistula placement.  The patient is aware the risks include but are not limited to: bleeding, infection, steal syndrome, nerve damage, ischemic monomelic neuropathy, failure to mature, and need for additional procedures.  The patient is aware of the risks of the procedure and elects to proceed forward.  DESCRIPTION: After full informed written consent was obtained from the patient, the patient was brought back to the operating room and placed supine upon the operating table.  Prior to induction, the patient received IV antibiotics.   After obtaining adequate anesthesia, the patient was then prepped and draped in the standard fashion for a left arm access procedure.   A first assistant was required to provide a safe and appropriate environment for executing the surgery.  The assistant was integral in providing retraction, exposure, running suture providing suction and in the closing process.   A curvilinear incision was then created midway between the radial impulse and the cephalic vein. The cephalic vein was then identified and dissected circumferentially. It was marked with a surgical marker.    Attention was then turned to the brachial artery which was exposed through the same incision and looped proximally and distally. Side branches were controlled with 4-0 silk ties.  The distal segment of the vein was ligated with a  2-0 silk, and the vein was transected.  The proximal  segment was interrogated with serial dilators.  The vein accepted up to a 4.5 mm dilator without any difficulty. Heparinized saline was infused into the vein and clamped it with a small bulldog.  At this point, I reset my exposure of the brachial artery and controlled the artery with vessel loops proximally and distally.  An arteriotomy was then made with a #11 blade, and extended with a Potts scissor.  Heparinized saline was injected proximal and distal into the radial artery.  The vein was then approximated to the artery while the artery was in its native bed and subsequently the vein was beveled using Potts scissors. The vein was then sewn to the artery in an end-to-side configuration with a running stitch of 6-0 Prolene.  Prior to completing this anastomosis Flushing maneuvers were performed and the artery was allowed to forward and back bleed.  There was no evidence of clot from any vessels.  I completed the anastomosis in the usual fashion and then released all vessel loops and clamps.    There was good  thrill in the venous outflow, and there was 1+ palpable radial pulse.  At this point, I irrigated out the surgical wound.  There was no further active bleeding.  The subcutaneous tissue was reapproximated with a running stitch of 3-0 Vicryl.  The skin was then reapproximated with a running subcuticular stitch of 4-0 Vicryl.  The skin was then cleaned, dried, and reinforced with Dermabond.    The patient tolerated this procedure well.   COMPLICATIONS: None  CONDITION: [name]  Office: [phone]   05/25/2023, 11:57 AM

## 2023-05-26 ENCOUNTER — Encounter: Payer: Self-pay | Admitting: Vascular Surgery

## 2023-05-30 ENCOUNTER — Ambulatory Visit (INDEPENDENT_AMBULATORY_CARE_PROVIDER_SITE_OTHER): Payer: Medicare Other | Admitting: Nurse Practitioner

## 2023-05-30 ENCOUNTER — Encounter (INDEPENDENT_AMBULATORY_CARE_PROVIDER_SITE_OTHER): Payer: Self-pay | Admitting: Nurse Practitioner

## 2023-05-30 VITALS — BP 110/59 | HR 67 | Resp 16

## 2023-05-30 DIAGNOSIS — N186 End stage renal disease: Secondary | ICD-10-CM

## 2023-05-30 MED ORDER — DOXYCYCLINE HYCLATE 100 MG PO CAPS: 100.0000 mg | ORAL_CAPSULE | Freq: Two times a day (BID) | ORAL | 0 refills | Status: DC

## 2023-05-31 ENCOUNTER — Encounter (INDEPENDENT_AMBULATORY_CARE_PROVIDER_SITE_OTHER): Payer: Self-pay | Admitting: Nurse Practitioner

## 2023-05-31 NOTE — Progress Notes (Signed)
Subjective:    Patient ID: Cassandra [name], female    DOB: [birth year], 89 y.o.   MRN: [id number] Chief Complaint  Patient presents with   Follow-up    Access draining    The patient presents today for wound evaluation after left brachiocephalic AV fistula placement on 05/25/2023.  She subsequently had swelling and some slight dehiscence of the wound with drainage from the area.  Currently the wound is dehiscence she has a good thrill or bruit noted    Review of Systems  Skin:  Positive for color change.  All other systems reviewed and are negative.      Objective:   Physical Exam Vitals reviewed.  HENT:     Head: Normocephalic.  Cardiovascular:     Rate and Rhythm: Normal rate.  Pulmonary:     Effort: Pulmonary effort is normal.  Skin:    General: Skin is warm and dry.  Neurological:     Mental Status: She is alert and oriented to person, place, and time.  Psychiatric:        Mood and Affect: Mood normal.        Behavior: Behavior normal.        Thought Content: Thought content normal.        Judgment: Judgment normal.     BP (!) 110/59 (BP Location: Right Arm)   Pulse 67   Resp 16   Past Medical History:  Diagnosis Date   Anxiety    Aortic atherosclerosis (HCC)    Arthritis    osteoarthritis in hands, knees ( R torn cartilage, L meniscus removed)    Atherosclerosis of native coronary artery of native heart with unstable angina pectoris (HCC)    CAD (coronary artery disease) 1999   a.) s/p PCI (details unkown) in [place] in 1999   Campylobacter antigen positive 11/29/2014   a.) presented to [facility] ED with N/V/D; stool culture (+)   ESRD on hemodialysis (HCC)    a.) T-Th-Sat   Glaucoma    Hypercholesterolemia    Hyperlipidemia    Hypertension    Hypothyroidism    Lumbar stenosis with neurogenic claudication    Perforation of colon as colonoscopy complication (HCC) 2010   a.) required short term colostomy with subsequent take down/reversal   RLS  (restless legs syndrome)    Thrombocytosis    Type 2 diabetes mellitus with chronic kidney disease (HCC)     Social History   Socioeconomic History   Marital status: Married    Spouse name: Chrissie Noa   Number of children: 3   Years of education: Not on file   Highest education level: Not on file  Occupational History   Not on file  Tobacco Use   Smoking status: Former    Current packs/day: 0.00    Types: Cigarettes    Quit date: 01/21/1980    Years since quitting: 43.3   Smokeless tobacco: Never  Vaping Use   Vaping status: Never Used  Substance and Sexual Activity   Alcohol use: Yes    Alcohol/week: 0.0 standard drinks of alcohol    Comment: occasionally   Drug use: No   Sexual activity: Never  Other Topics Concern   Not on file  Social History Narrative   Not on file   Social Determinants of Health   Financial Resource Strain: Not on file  Food Insecurity: No Food Insecurity (03/06/2023)   Hunger Vital Sign    Worried About Running Out of Food in the Last  Year: Never true    Ran Out of Food in the Last Year: Never true  Transportation Needs: No Transportation Needs (03/06/2023)   PRAPARE - Administrator, Civil Service (Medical): No    Lack of Transportation (Non-Medical): No  Physical Activity: Not on file  Stress: Not on file  Social Connections: Not on file  Intimate Partner Violence: Not At Risk (03/06/2023)   Humiliation, Afraid, Rape, and Kick questionnaire    Fear of Current or Ex-Partner: No    Emotionally Abused: No    Physically Abused: No    Sexually Abused: No    Past Surgical History:  Procedure Laterality Date   ABDOMINAL HYSTERECTOMY     left ovary removed   AV FISTULA PLACEMENT Left 05/25/2023   Procedure: ARTERIOVENOUS (AV) FISTULA CREATION (BRACHIALCEPHALIC);  Surgeon: Renford Dills, MD;  Location: [facility] ORS;  Service: Vascular;  Laterality: Left;   BLEPHAROPLASTY Bilateral    CATARACT EXTRACTION W/ INTRAOCULAR LENS  IMPLANT,  BILATERAL     COLONOSCOPY  2010   COLOSTOMY  03/2009   perforated colon from colonoscopy   COLOSTOMY REVERSAL  08/2009   CORONARY ANGIOPLASTY WITH STENT PLACEMENT  1999   DIALYSIS/PERMA CATHETER INSERTION N/A 03/08/2023   Procedure: DIALYSIS/PERMA CATHETER INSERTION;  Surgeon: Annice Needy, MD;  Location: [facility];  Service: Cardiovascular;  Laterality: N/A;   FOOT SURGERY Bilateral    toes correction   KNEE ARTHROSCOPY Right    torn cartilage   KNEE ARTHROSCOPY W/ MENISCECTOMY Left 01/2012   TEMPORARY DIALYSIS CATHETER N/A 03/05/2023   Procedure: TEMPORARY DIALYSIS CATHETER;  Surgeon: Annice Needy, MD;  Location: [facility];  Service: Cardiovascular;  Laterality: N/A;    Family History  Problem Relation Age of Onset   Hypertension Mother    Hypertension Father    Early death Father    Heart disease Father     Allergies  Allergen Reactions   Atorvastatin Other (See Comments)    MYALGIA   Cyclobenzaprine Other (See Comments)    hallucination   Hydrocodone-Acetaminophen     Other reaction(s): Hallucination when taking whole pill, pt tolerates taking 1/2 pill   Mirtazapine     Other reaction(s): Hallucination   Oxycodone-Acetaminophen Other (See Comments)    hallucination   Paroxetine Hcl Other (See Comments)    hallucination   Penicillins Swelling    Lip and orbital swelling Tolerated 3rd generation cephalosporin (CEFTRIAXONE) between 03/05/2023 and 03/09/2023 with no documented ADRs.   Propoxyphene Other (See Comments)    hallucination   Trazodone Other (See Comments)    hallucination       Latest Ref Rng & Units 05/25/2023    8:53 AM 05/23/2023    2:29 PM 03/13/2023    7:42 AM  CBC  WBC 4.0 - 10.5 K/uL  14.0  17.6   Hemoglobin 12.0 - 15.0 g/dL 40.9  81.1  91.4   Hematocrit 36.0 - 46.0 % 46.0  40.2  35.1   Platelets 150 - 400 K/uL  508  450       CMP     Component Value Date/Time   NA 139 05/25/2023 0853   NA 137 12/07/2012 1641   K  3.2 (L) 05/25/2023 0853   K 4.2 12/07/2012 1641   CL 96 (L) 05/25/2023 0853   CL 106 12/07/2012 1641   CO2 26 05/23/2023 1429   CO2 27 12/07/2012 1641   GLUCOSE 96 05/25/2023 0853   GLUCOSE 75  12/07/2012 1641   BUN 14 05/25/2023 0853   BUN 18 12/07/2012 1641   CREATININE 7.80 (H) 05/25/2023 0853   CREATININE 1.13 12/07/2012 1641   CALCIUM 8.5 (L) 05/23/2023 1429   CALCIUM 8.9 12/07/2012 1641   PROT 6.5 03/06/2023 0609   PROT 8.6 (H) 12/07/2012 1641   ALBUMIN 2.4 (L) 03/13/2023 0742   ALBUMIN 3.2 (L) 12/07/2012 1641   AST 28 03/06/2023 0609   AST 37 12/07/2012 1641   ALT 12 03/06/2023 0609   ALT 33 12/07/2012 1641   ALKPHOS 46 03/06/2023 0609   ALKPHOS 72 12/07/2012 1641   BILITOT 1.0 03/06/2023 0609   BILITOT 0.4 12/07/2012 1641   GFRNONAA 6 (L) 05/23/2023 1429   GFRNONAA 45 (L) 12/07/2012 1641     No results found.     Assessment & Plan:   1. ESRD (end stage renal disease) (HCC) Today the wound was cleaned and Steri-Strips placed over it.  Will plan on having her return in the next several weeks in order to evaluate the wound again   Current Outpatient Medications on File Prior to Visit  Medication Sig Dispense Refill   ALPRAZolam (XANAX) 0.25 MG tablet Take 1 tablet (0.25 mg total) by mouth daily as needed. 10 tablet 0   amLODipine (NORVASC) 10 MG tablet Take 10 mg by mouth daily.     aspirin EC 81 MG EC tablet Take 1 tablet (81 mg total) by mouth daily. Swallow whole. 30 tablet 11   cetirizine (ZYRTEC) 10 MG tablet Take 1 tablet by mouth daily as needed for allergies.     cloNIDine (CATAPRES) 0.1 MG tablet Take 1 tablet (0.1 mg total) by mouth 2 (two) times daily. (Patient taking differently: Take 0.1 mg by mouth 2 (two) times daily as needed.) 60 tablet 0   [employer] D3 25 MCG (1000 UT) capsule Take 1,000 Units by mouth daily.     DROXIA 300 MG capsule TAKE 1 CAPSULE (300 MG TOTAL) BY MOUTH DAILY. MAY TAKE WITH FOOD TO MINIMIZE GI SIDE EFFECTS. 30 capsule 0   gabapentin  (NEURONTIN) 100 MG capsule Take 2 capsules (200 mg total) by mouth at bedtime. (Patient taking differently: Take 100-200 mg by mouth 3 (three) times daily. 100 mg am 100 mg afternoon 200 mg bedtime) 60 capsule 0   glucose blood test strip Use once daily. Use as instructed.     latanoprost (XALATAN) 0.005 % ophthalmic solution Place 1 drop into both eyes at bedtime.     levothyroxine (SYNTHROID) 88 MCG tablet Take 88 mcg by mouth daily. Take on an empty stomach 30 to 60 minutes before breakfast     multivitamin (RENA-VIT) TABS tablet Take 1 tablet by mouth at bedtime.  0   Nutritional Supplements (FEEDING SUPPLEMENT, NEPRO CARB STEADY,) LIQD Take 237 mLs by mouth 2 (two) times daily between meals.  0   rOPINIRole (REQUIP) 0.5 MG tablet Take 0.5 mg by mouth at bedtime.     rosuvastatin (CRESTOR) 20 MG tablet Take 20 mg by mouth daily.     timolol (TIMOPTIC-XR) 0.5 % ophthalmic gel-forming Place 1 drop into both eyes every morning.     traMADol (ULTRAM) 50 MG tablet Take 1 tablet (50 mg total) by mouth every 6 (six) hours as needed. 26 tablet 0   No current facility-administered medications on file prior to visit.    There are no Patient Instructions on file for this visit. No follow-ups on file.   Georgiana Spinner, NP

## 2023-06-07 NOTE — Progress Notes (Signed)
Patient ID: Cassandra [name], female   DOB: [birth year], 89 y.o.   MRN: [id number]  No chief complaint on file.   HPI Cassandra [name] is a 89 y.o. female.    No c/o notes it is itching   Past Medical History:  Diagnosis Date   Anxiety    Aortic atherosclerosis (HCC)    Arthritis    osteoarthritis in hands, knees ( R torn cartilage, L meniscus removed)    Atherosclerosis of native coronary artery of native heart with unstable angina pectoris (HCC)    CAD (coronary artery disease) 1999   a.) s/p PCI (details unkown) in [place] in 1999   Campylobacter antigen positive 11/29/2014   a.) presented to [facility] ED with N/V/D; stool culture (+)   ESRD on hemodialysis (HCC)    a.) T-Th-Sat   Glaucoma    Hypercholesterolemia    Hyperlipidemia    Hypertension    Hypothyroidism    Lumbar stenosis with neurogenic claudication    Perforation of colon as colonoscopy complication (HCC) 2010   a.) required short term colostomy with subsequent take down/reversal   RLS (restless legs syndrome)    Thrombocytosis    Type 2 diabetes mellitus with chronic kidney disease (HCC)     Past Surgical History:  Procedure Laterality Date   ABDOMINAL HYSTERECTOMY     left ovary removed   AV FISTULA PLACEMENT Left 05/25/2023   Procedure: ARTERIOVENOUS (AV) FISTULA CREATION (BRACHIALCEPHALIC);  Surgeon: Cassandra Dills, MD;  Location: [facility] ORS;  Service: Vascular;  Laterality: Left;   BLEPHAROPLASTY Bilateral    CATARACT EXTRACTION W/ INTRAOCULAR LENS  IMPLANT, BILATERAL     COLONOSCOPY  2010   COLOSTOMY  03/2009   perforated colon from colonoscopy   COLOSTOMY REVERSAL  08/2009   CORONARY ANGIOPLASTY WITH STENT PLACEMENT  1999   DIALYSIS/PERMA CATHETER INSERTION N/A 03/08/2023   Procedure: DIALYSIS/PERMA CATHETER INSERTION;  Surgeon: Cassandra Needy, MD;  Location: [facility];  Service: Cardiovascular;  Laterality: N/A;   FOOT SURGERY Bilateral    toes correction   KNEE ARTHROSCOPY Right     torn cartilage   KNEE ARTHROSCOPY W/ MENISCECTOMY Left 01/2012   TEMPORARY DIALYSIS CATHETER N/A 03/05/2023   Procedure: TEMPORARY DIALYSIS CATHETER;  Surgeon: Cassandra Needy, MD;  Location: [facility];  Service: Cardiovascular;  Laterality: N/A;      Allergies  Allergen Reactions   Atorvastatin Other (See Comments)    MYALGIA   Cyclobenzaprine Other (See Comments)    hallucination   Hydrocodone-Acetaminophen     Other reaction(s): Hallucination when taking whole pill, pt tolerates taking 1/2 pill   Mirtazapine     Other reaction(s): Hallucination   Oxycodone-Acetaminophen Other (See Comments)    hallucination   Paroxetine Hcl Other (See Comments)    hallucination   Penicillins Swelling    Lip and orbital swelling Tolerated 3rd generation cephalosporin (CEFTRIAXONE) between 03/05/2023 and 03/09/2023 with no documented ADRs.   Propoxyphene Other (See Comments)    hallucination   Trazodone Other (See Comments)    hallucination    Current Outpatient Medications  Medication Sig Dispense Refill   ALPRAZolam (XANAX) 0.25 MG tablet Take 1 tablet (0.25 mg total) by mouth daily as needed. 10 tablet 0   amLODipine (NORVASC) 10 MG tablet Take 10 mg by mouth daily.     aspirin EC 81 MG EC tablet Take 1 tablet (81 mg total) by mouth daily. Swallow whole. 30 tablet 11   cetirizine (  ZYRTEC) 10 MG tablet Take 1 tablet by mouth daily as needed for allergies.     cloNIDine (CATAPRES) 0.1 MG tablet Take 1 tablet (0.1 mg total) by mouth 2 (two) times daily. (Patient taking differently: Take 0.1 mg by mouth 2 (two) times daily as needed.) 60 tablet 0   CVS D3 25 MCG (1000 UT) capsule Take 1,000 Units by mouth daily.     doxycycline (VIBRAMYCIN) 100 MG capsule Take 1 capsule (100 mg total) by mouth 2 (two) times daily. 14 capsule 0   DROXIA 300 MG capsule TAKE 1 CAPSULE (300 MG TOTAL) BY MOUTH DAILY. MAY TAKE WITH FOOD TO MINIMIZE GI SIDE EFFECTS. 30 capsule 0   gabapentin (NEURONTIN)  100 MG capsule Take 2 capsules (200 mg total) by mouth at bedtime. (Patient taking differently: Take 100-200 mg by mouth 3 (three) times daily. 100 mg am 100 mg afternoon 200 mg bedtime) 60 capsule 0   glucose blood test strip Use once daily. Use as instructed.     latanoprost (XALATAN) 0.005 % ophthalmic solution Place 1 drop into both eyes at bedtime.     levothyroxine (SYNTHROID) 88 MCG tablet Take 88 mcg by mouth daily. Take on an empty stomach 30 to 60 minutes before breakfast     multivitamin (RENA-VIT) TABS tablet Take 1 tablet by mouth at bedtime.  0   Nutritional Supplements (FEEDING SUPPLEMENT, NEPRO CARB STEADY,) LIQD Take 237 mLs by mouth 2 (two) times daily between meals.  0   rOPINIRole (REQUIP) 0.5 MG tablet Take 0.5 mg by mouth at bedtime.     rosuvastatin (CRESTOR) 20 MG tablet Take 20 mg by mouth daily.     timolol (TIMOPTIC-XR) 0.5 % ophthalmic gel-forming Place 1 drop into both eyes every morning.     traMADol (ULTRAM) 50 MG tablet Take 1 tablet (50 mg total) by mouth every 6 (six) hours as needed. 26 tablet 0   No current facility-administered medications for this visit.        Physical Exam There were no vitals taken for this visit. Gen:  WD/WN, NAD Skin: incision lt arm is essentially healed, excellent thrill and bruit     Assessment/Plan:  1. ESRD (end stage renal disease) (HCC) Recommend:  The patient is doing well and currently has adequate dialysis access. The patient's dialysis center is not reporting any access issues. Not ready for accessing yet but her fistula certainly seems to be doing quite well.  Incision is now nearly healed  The patient should have a duplex ultrasound of the dialysis access in 4 weeks. The patient will follow-up with me in the office after each ultrasound   - VAS US DUPLEX DIALYSIS ACCESS (AVF, AVG); Future      Cassandra [name] 06/07/2023, 12:34 PM   This note was created with Dragon medical transcription system.   Any errors from dictation are unintentional.

## 2023-06-11 ENCOUNTER — Encounter (INDEPENDENT_AMBULATORY_CARE_PROVIDER_SITE_OTHER): Payer: Self-pay | Admitting: Vascular Surgery

## 2023-06-11 ENCOUNTER — Ambulatory Visit (INDEPENDENT_AMBULATORY_CARE_PROVIDER_SITE_OTHER): Payer: Medicare Other | Admitting: Vascular Surgery

## 2023-06-11 VITALS — BP 132/80 | HR 65 | Resp 18 | Ht 61.0 in | Wt 127.4 lb

## 2023-06-11 DIAGNOSIS — N186 End stage renal disease: Secondary | ICD-10-CM

## 2023-06-22 ENCOUNTER — Encounter (INDEPENDENT_AMBULATORY_CARE_PROVIDER_SITE_OTHER): Payer: Medicare Other

## 2023-06-22 ENCOUNTER — Ambulatory Visit (INDEPENDENT_AMBULATORY_CARE_PROVIDER_SITE_OTHER): Payer: Medicare Other | Admitting: Nurse Practitioner

## 2023-07-01 NOTE — Progress Notes (Signed)
Patient ID: Cassandra [name], female   DOB: 1927/05/21, 89 y.o.   MRN: [id number]  No chief complaint on file.   HPI Cassandra [name] is a 89 y.o. female.    Procedure 05/25/2023: left brachial cephalic arteriovenous fistula placement   The patient denies any complaints.  She is anxious to use the fistula.  Duplex ultrasound of the fistula demonstrates a flow volume of 1326.  Past Medical History:  Diagnosis Date   Anxiety    Aortic atherosclerosis (HCC)    Arthritis    osteoarthritis in hands, knees ( R torn cartilage, L meniscus removed)    Atherosclerosis of native coronary artery of native heart with unstable angina pectoris (HCC)    CAD (coronary artery disease) 1999   a.) s/p PCI (details unkown) in [place] in 1999   Campylobacter antigen positive 11/29/2014   a.) presented to [facility] ED with N/V/D; stool culture (+)   ESRD on hemodialysis (HCC)    a.) T-Th-Sat   Glaucoma    Hypercholesterolemia    Hyperlipidemia    Hypertension    Hypothyroidism    Lumbar stenosis with neurogenic claudication    Perforation of colon as colonoscopy complication (HCC) 2010   a.) required short term colostomy with subsequent take down/reversal   RLS (restless legs syndrome)    Thrombocytosis    Type 2 diabetes mellitus with chronic kidney disease (HCC)     Past Surgical History:  Procedure Laterality Date   ABDOMINAL HYSTERECTOMY     left ovary removed   AV FISTULA PLACEMENT Left 05/25/2023   Procedure: ARTERIOVENOUS (AV) FISTULA CREATION (BRACHIALCEPHALIC);  Surgeon: Renford Dills, MD;  Location: [facility];  Service: Vascular;  Laterality: Left;   BLEPHAROPLASTY Bilateral    CATARACT EXTRACTION W/ INTRAOCULAR LENS  IMPLANT, BILATERAL     COLONOSCOPY  2010   COLOSTOMY  03/2009   perforated colon from colonoscopy   COLOSTOMY REVERSAL  08/2009   CORONARY ANGIOPLASTY WITH STENT PLACEMENT  1999   DIALYSIS/PERMA CATHETER INSERTION N/A 03/08/2023   Procedure: DIALYSIS/PERMA  CATHETER INSERTION;  Surgeon: Annice Needy, MD;  Location: [facility];  Service: Cardiovascular;  Laterality: N/A;   FOOT SURGERY Bilateral    toes correction   KNEE ARTHROSCOPY Right    torn cartilage   KNEE ARTHROSCOPY W/ MENISCECTOMY Left 01/2012   TEMPORARY DIALYSIS CATHETER N/A 03/05/2023   Procedure: TEMPORARY DIALYSIS CATHETER;  Surgeon: Annice Needy, MD;  Location: [facility];  Service: Cardiovascular;  Laterality: N/A;      Allergies  Allergen Reactions   Atorvastatin Other (See Comments)    MYALGIA   Cyclobenzaprine Other (See Comments)    hallucination   Hydrocodone-Acetaminophen     Other reaction(s): Hallucination when taking whole pill, pt tolerates taking 1/2 pill   Mirtazapine     Other reaction(s): Hallucination   Oxycodone-Acetaminophen Other (See Comments)    hallucination   Paroxetine Hcl Other (See Comments)    hallucination   Penicillins Swelling    Lip and orbital swelling Tolerated 3rd generation cephalosporin (CEFTRIAXONE) between 03/05/2023 and 03/09/2023 with no documented ADRs.   Propoxyphene Other (See Comments)    hallucination   Trazodone Other (See Comments)    hallucination    Current Outpatient Medications  Medication Sig Dispense Refill   ALPRAZolam (XANAX) 0.25 MG tablet Take 1 tablet (0.25 mg total) by mouth daily as needed. 10 tablet 0   amLODipine (NORVASC) 10 MG tablet Take 10 mg by mouth  daily.     aspirin EC 81 MG EC tablet Take 1 tablet (81 mg total) by mouth daily. Swallow whole. 30 tablet 11   cetirizine (ZYRTEC) 10 MG tablet Take 1 tablet by mouth daily as needed for allergies.     cloNIDine (CATAPRES) 0.1 MG tablet Take 1 tablet (0.1 mg total) by mouth 2 (two) times daily. (Patient taking differently: Take 0.1 mg by mouth 2 (two) times daily as needed.) 60 tablet 0   CVS D3 25 MCG (1000 UT) capsule Take 1,000 Units by mouth daily.     doxycycline (VIBRAMYCIN) 100 MG capsule Take 1 capsule (100 mg total) by  mouth 2 (two) times daily. 14 capsule 0   DROXIA 300 MG capsule TAKE 1 CAPSULE (300 MG TOTAL) BY MOUTH DAILY. MAY TAKE WITH FOOD TO MINIMIZE GI SIDE EFFECTS. 30 capsule 0   gabapentin (NEURONTIN) 100 MG capsule Take 2 capsules (200 mg total) by mouth at bedtime. (Patient taking differently: Take 100-200 mg by mouth 3 (three) times daily. 100 mg am 100 mg afternoon 200 mg bedtime) 60 capsule 0   glucose blood test strip Use once daily. Use as instructed.     latanoprost (XALATAN) 0.005 % ophthalmic solution Place 1 drop into both eyes at bedtime.     levothyroxine (SYNTHROID) 88 MCG tablet Take 88 mcg by mouth daily. Take on an empty stomach 30 to 60 minutes before breakfast     multivitamin (RENA-VIT) TABS tablet Take 1 tablet by mouth at bedtime.  0   Nutritional Supplements (FEEDING SUPPLEMENT, NEPRO CARB STEADY,) LIQD Take 237 mLs by mouth 2 (two) times daily between meals.  0   rOPINIRole (REQUIP) 0.5 MG tablet Take 0.5 mg by mouth at bedtime.     rosuvastatin (CRESTOR) 20 MG tablet Take 20 mg by mouth daily.     timolol (TIMOPTIC-XR) 0.5 % ophthalmic gel-forming Place 1 drop into both eyes every morning.     traMADol (ULTRAM) 50 MG tablet Take 1 tablet (50 mg total) by mouth every 6 (six) hours as needed. 26 tablet 0   No current facility-administered medications for this visit.        Physical Exam There were no vitals taken for this visit. Gen:  WD/WN, NAD Skin: incision C/D/I, the fistula is easily palpable and appears to be 8 to 10 mm in diameter.  Excellent bruit excellent thrill     Assessment/Plan: 1. ESRD (end stage renal disease) (HCC) Recommend:  The patient is doing well and currently has adequate dialysis access.  The patient's dialysis center can begin accessing the fistula next week.    Flow pattern is excellent.  The patient should have a duplex ultrasound of the dialysis access in 6 months. The patient will follow-up with me in the office after each  ultrasound   - VAS US DUPLEX DIALYSIS ACCESS (AVF, AVG); Future       Levora Dredge 07/01/2023, 3:32 PM   This note was created with Dragon medical transcription system.  Any errors from dictation are unintentional.

## 2023-07-02 ENCOUNTER — Ambulatory Visit (INDEPENDENT_AMBULATORY_CARE_PROVIDER_SITE_OTHER): Payer: Medicare Other | Admitting: Vascular Surgery

## 2023-07-02 ENCOUNTER — Ambulatory Visit (INDEPENDENT_AMBULATORY_CARE_PROVIDER_SITE_OTHER): Payer: Medicare Other

## 2023-07-02 ENCOUNTER — Encounter (INDEPENDENT_AMBULATORY_CARE_PROVIDER_SITE_OTHER): Payer: Self-pay | Admitting: Vascular Surgery

## 2023-07-02 VITALS — BP 139/66 | HR 73 | Resp 16 | Wt 129.4 lb

## 2023-07-02 DIAGNOSIS — N186 End stage renal disease: Secondary | ICD-10-CM

## 2023-07-06 ENCOUNTER — Encounter (INDEPENDENT_AMBULATORY_CARE_PROVIDER_SITE_OTHER): Payer: Self-pay | Admitting: Vascular Surgery

## 2023-07-23 ENCOUNTER — Encounter (INDEPENDENT_AMBULATORY_CARE_PROVIDER_SITE_OTHER): Payer: Medicare Other

## 2023-07-23 ENCOUNTER — Ambulatory Visit (INDEPENDENT_AMBULATORY_CARE_PROVIDER_SITE_OTHER): Payer: Medicare Other | Admitting: Vascular Surgery

## 2023-08-03 ENCOUNTER — Other Ambulatory Visit (INDEPENDENT_AMBULATORY_CARE_PROVIDER_SITE_OTHER): Payer: Self-pay

## 2023-08-03 ENCOUNTER — Telehealth (INDEPENDENT_AMBULATORY_CARE_PROVIDER_SITE_OTHER): Payer: Self-pay

## 2023-08-03 NOTE — Telephone Encounter (Addendum)
Patient called saying she is having serious problems and pain with her fistula. She would like to be seen to get evaluated. Called patient to get more information, patient didn't answer and her voicemail isn't available   Please advise

## 2023-08-03 NOTE — Telephone Encounter (Addendum)
She stated they can't get the needle in and she has bruising up her to shoulder, She stated she is worried and hurting.  She would like an appt.

## 2023-08-06 ENCOUNTER — Ambulatory Visit (INDEPENDENT_AMBULATORY_CARE_PROVIDER_SITE_OTHER): Payer: Medicare Other | Admitting: Vascular Surgery

## 2023-08-06 ENCOUNTER — Encounter (INDEPENDENT_AMBULATORY_CARE_PROVIDER_SITE_OTHER): Payer: Self-pay | Admitting: Vascular Surgery

## 2023-08-06 VITALS — BP 129/69 | HR 72 | Resp 16

## 2023-08-06 DIAGNOSIS — N186 End stage renal disease: Secondary | ICD-10-CM

## 2023-08-06 DIAGNOSIS — T829XXA Unspecified complication of cardiac and vascular prosthetic device, implant and graft, initial encounter: Secondary | ICD-10-CM

## 2023-08-06 DIAGNOSIS — I25119 Atherosclerotic heart disease of native coronary artery with unspecified angina pectoris: Secondary | ICD-10-CM

## 2023-08-06 DIAGNOSIS — I10 Essential (primary) hypertension: Secondary | ICD-10-CM

## 2023-08-06 NOTE — Progress Notes (Unsigned)
MRN : [id number]  Cassandra [name] is a 89 y.o. (1926-10-21) female who presents with chief complaint of check access.  History of Present Illness:   Patient is seen sooner than expected secondary to an infiltration at dialysis.  Procedure 05/25/2023: left brachial cephalic arteriovenous fistula placement    Previousuplex ultrasound of the fistula demonstrates a flow volume of 1326.  No outpatient medications have been marked as taking for the 08/06/23 encounter (Appointment) with Gilda Crease, Latina Craver, MD.    Past Medical History:  Diagnosis Date   Anxiety    Aortic atherosclerosis (HCC)    Arthritis    osteoarthritis in hands, knees ( R torn cartilage, L meniscus removed)    Atherosclerosis of native coronary artery of native heart with unstable angina pectoris (HCC)    CAD (coronary artery disease) 1999   a.) s/p PCI (details unkown) in [place] in 1999   Campylobacter antigen positive 11/29/2014   a.) presented to [facility] ED with N/V/D; stool culture (+)   ESRD on hemodialysis (HCC)    a.) T-Th-Sat   Glaucoma    Hypercholesterolemia    Hyperlipidemia    Hypertension    Hypothyroidism    Lumbar stenosis with neurogenic claudication    Perforation of colon as colonoscopy complication (HCC) 2010   a.) required short term colostomy with subsequent take down/reversal   RLS (restless legs syndrome)    Thrombocytosis    Type 2 diabetes mellitus with chronic kidney disease (HCC)     Past Surgical History:  Procedure Laterality Date   ABDOMINAL HYSTERECTOMY     left ovary removed   AV FISTULA PLACEMENT Left 05/25/2023   Procedure: ARTERIOVENOUS (AV) FISTULA CREATION (BRACHIALCEPHALIC);  Surgeon: Renford Dills, MD;  Location: [facility];  Service: Vascular;  Laterality: Left;   BLEPHAROPLASTY Bilateral    CATARACT EXTRACTION W/ INTRAOCULAR LENS  IMPLANT, BILATERAL     COLONOSCOPY  2010   COLOSTOMY  03/2009   perforated colon from colonoscopy    COLOSTOMY REVERSAL  08/2009   CORONARY ANGIOPLASTY WITH STENT PLACEMENT  1999   DIALYSIS/PERMA CATHETER INSERTION N/A 03/08/2023   Procedure: DIALYSIS/PERMA CATHETER INSERTION;  Surgeon: Annice Needy, MD;  Location: [facility];  Service: Cardiovascular;  Laterality: N/A;   FOOT SURGERY Bilateral    toes correction   KNEE ARTHROSCOPY Right    torn cartilage   KNEE ARTHROSCOPY W/ MENISCECTOMY Left 01/2012   TEMPORARY DIALYSIS CATHETER N/A 03/05/2023   Procedure: TEMPORARY DIALYSIS CATHETER;  Surgeon: Annice Needy, MD;  Location: [facility];  Service: Cardiovascular;  Laterality: N/A;    Social History Social History   Tobacco Use   Smoking status: Former    Current packs/day: 0.00    Types: Cigarettes    Quit date: 01/21/1980    Years since quitting: 43.5   Smokeless tobacco: Never  Vaping Use   Vaping status: Never Used  Substance Use Topics   Alcohol use: Yes    Alcohol/week: 0.0 standard drinks of alcohol    Comment: occasionally   Drug use: No    Family History Family History  Problem Relation Age of Onset   Hypertension Mother    Hypertension Father    Early death Father    Heart disease Father     Allergies  Allergen Reactions   Atorvastatin Other (See Comments)    MYALGIA  Cyclobenzaprine Other (See Comments)    hallucination   Hydrocodone-Acetaminophen     Other reaction(s): Hallucination when taking whole pill, pt tolerates taking 1/2 pill   Mirtazapine     Other reaction(s): Hallucination   Oxycodone-Acetaminophen Other (See Comments)    hallucination   Paroxetine Hcl Other (See Comments)    hallucination   Penicillins Swelling    Lip and orbital swelling Tolerated 3rd generation cephalosporin (CEFTRIAXONE) between 03/05/2023 and 03/09/2023 with no documented ADRs.   Propoxyphene Other (See Comments)    hallucination   Trazodone Other (See Comments)    hallucination     REVIEW OF SYSTEMS (Negative unless  checked)  Constitutional: [] Weight loss  [] Fever  [] Chills Cardiac: [] Chest pain   [] Chest pressure   [] Palpitations   [] Shortness of breath when laying flat   [] Shortness of breath with exertion. Vascular:  [] Pain in legs with walking   [] Pain in legs at rest  [] History of DVT   [] Phlebitis   [] Swelling in legs   [] Varicose veins   [] Non-healing ulcers Pulmonary:   [] Uses home oxygen   [] Productive cough   [] Hemoptysis   [] Wheeze  [] COPD   [] Asthma Neurologic:  [] Dizziness   [] Seizures   [] History of stroke   [] History of TIA  [] Aphasia   [] Vissual changes   [] Weakness or numbness in arm   [] Weakness or numbness in leg Musculoskeletal:   [] Joint swelling   [] Joint pain   [] Low back pain Hematologic:  [] Easy bruising  [] Easy bleeding   [] Hypercoagulable state   [] Anemic Gastrointestinal:  [] Diarrhea   [] Vomiting  [] Gastroesophageal reflux/heartburn   [] Difficulty swallowing. Genitourinary:  [x] Chronic kidney disease   [] Difficult urination  [] Frequent urination   [] Blood in urine Skin:  [] Rashes   [] Ulcers  Psychological:  [] History of anxiety   []  History of major depression.  Physical Examination  There were no vitals filed for this visit. There is no height or weight on file to calculate BMI. Gen: WD/WN, NAD Head: [place]/AT, No temporalis wasting.  Ear/Nose/Throat: Hearing grossly intact, nares w/o erythema or drainage Eyes: PER, EOMI, sclera nonicteric.  Neck: Supple, no gross masses or lesions.  No JVD.  Pulmonary:  Good air movement, no audible wheezing, no use of accessory muscles.  Cardiac: RRR, precordium non-hyperdynamic. Vascular:   *** Vessel Right Left  Radial Palpable Palpable  Brachial Palpable Palpable  Gastrointestinal: soft, non-distended. No guarding/no peritoneal signs.  Musculoskeletal: M/S 5/5 throughout.  No deformity.  Neurologic: CN 2-12 intact. Pain and light touch intact in extremities.  Symmetrical.  Speech is fluent. Motor exam as listed above. Psychiatric:  Judgment intact, Mood & affect appropriate for pt's clinical situation. Dermatologic: No rashes or ulcers noted.  No changes consistent with cellulitis.   CBC Lab Results  Component Value Date   WBC 14.0 (H) 05/23/2023   HGB 15.6 (H) 05/25/2023   HCT 46.0 05/25/2023   MCV 98.3 05/23/2023   PLT 508 (H) 05/23/2023    BMET    Component Value Date/Time   NA 139 05/25/2023 0853   NA 137 12/07/2012 1641   K 3.2 (L) 05/25/2023 0853   K 4.2 12/07/2012 1641   CL 96 (L) 05/25/2023 0853   CL 106 12/07/2012 1641   CO2 26 05/23/2023 1429   CO2 27 12/07/2012 1641   GLUCOSE 96 05/25/2023 0853   GLUCOSE 75 12/07/2012 1641   BUN 14 05/25/2023 0853   BUN 18 12/07/2012 1641   CREATININE 7.80 (H) 05/25/2023 0853   CREATININE 1.13  12/07/2012 1641   CALCIUM 8.5 (L) 05/23/2023 1429   CALCIUM 8.9 12/07/2012 1641   GFRNONAA 6 (L) 05/23/2023 1429   GFRNONAA 45 (L) 12/07/2012 1641   GFRAA 25 (L) 03/30/2020 1026   GFRAA 52 (L) 12/07/2012 1641   CrCl cannot be calculated (Patient's most recent lab result is older than the maximum 21 days allowed.).  COAG Lab Results  Component Value Date   INR 1.1 12/10/2021   INR 1.0 10/12/2021   INR 1.1 05/15/2021    Radiology No results found.   Assessment/Plan There are no diagnoses linked to this encounter.   Levora Dredge, MD  08/06/2023 8:55 AM

## 2023-08-06 NOTE — H&P (View-Only) (Signed)
 MRN : [id number]  Cassandra [name] is a 89 y.o. (1926-10-21) female who presents with chief complaint of check access.  History of Present Illness:   Patient is seen sooner than expected secondary to an infiltration at dialysis.  Procedure 05/25/2023: left brachial cephalic arteriovenous fistula placement    Previousuplex ultrasound of the fistula demonstrates a flow volume of 1326.  No outpatient medications have been marked as taking for the 08/06/23 encounter (Appointment) with Gilda Crease, Latina Craver, MD.    Past Medical History:  Diagnosis Date   Anxiety    Aortic atherosclerosis (HCC)    Arthritis    osteoarthritis in hands, knees ( R torn cartilage, L meniscus removed)    Atherosclerosis of native coronary artery of native heart with unstable angina pectoris (HCC)    CAD (coronary artery disease) 1999   a.) s/p PCI (details unkown) in [place] in 1999   Campylobacter antigen positive 11/29/2014   a.) presented to [facility] ED with N/V/D; stool culture (+)   ESRD on hemodialysis (HCC)    a.) T-Th-Sat   Glaucoma    Hypercholesterolemia    Hyperlipidemia    Hypertension    Hypothyroidism    Lumbar stenosis with neurogenic claudication    Perforation of colon as colonoscopy complication (HCC) 2010   a.) required short term colostomy with subsequent take down/reversal   RLS (restless legs syndrome)    Thrombocytosis    Type 2 diabetes mellitus with chronic kidney disease (HCC)     Past Surgical History:  Procedure Laterality Date   ABDOMINAL HYSTERECTOMY     left ovary removed   AV FISTULA PLACEMENT Left 05/25/2023   Procedure: ARTERIOVENOUS (AV) FISTULA CREATION (BRACHIALCEPHALIC);  Surgeon: Renford Dills, MD;  Location: [facility];  Service: Vascular;  Laterality: Left;   BLEPHAROPLASTY Bilateral    CATARACT EXTRACTION W/ INTRAOCULAR LENS  IMPLANT, BILATERAL     COLONOSCOPY  2010   COLOSTOMY  03/2009   perforated colon from colonoscopy    COLOSTOMY REVERSAL  08/2009   CORONARY ANGIOPLASTY WITH STENT PLACEMENT  1999   DIALYSIS/PERMA CATHETER INSERTION N/A 03/08/2023   Procedure: DIALYSIS/PERMA CATHETER INSERTION;  Surgeon: Annice Needy, MD;  Location: [facility];  Service: Cardiovascular;  Laterality: N/A;   FOOT SURGERY Bilateral    toes correction   KNEE ARTHROSCOPY Right    torn cartilage   KNEE ARTHROSCOPY W/ MENISCECTOMY Left 01/2012   TEMPORARY DIALYSIS CATHETER N/A 03/05/2023   Procedure: TEMPORARY DIALYSIS CATHETER;  Surgeon: Annice Needy, MD;  Location: [facility];  Service: Cardiovascular;  Laterality: N/A;    Social History Social History   Tobacco Use   Smoking status: Former    Current packs/day: 0.00    Types: Cigarettes    Quit date: 01/21/1980    Years since quitting: 43.5   Smokeless tobacco: Never  Vaping Use   Vaping status: Never Used  Substance Use Topics   Alcohol use: Yes    Alcohol/week: 0.0 standard drinks of alcohol    Comment: occasionally   Drug use: No    Family History Family History  Problem Relation Age of Onset   Hypertension Mother    Hypertension Father    Early death Father    Heart disease Father     Allergies  Allergen Reactions   Atorvastatin Other (See Comments)    MYALGIA  Cyclobenzaprine Other (See Comments)    hallucination   Hydrocodone-Acetaminophen     Other reaction(s): Hallucination when taking whole pill, pt tolerates taking 1/2 pill   Mirtazapine     Other reaction(s): Hallucination   Oxycodone-Acetaminophen Other (See Comments)    hallucination   Paroxetine Hcl Other (See Comments)    hallucination   Penicillins Swelling    Lip and orbital swelling Tolerated 3rd generation cephalosporin (CEFTRIAXONE) between 03/05/2023 and 03/09/2023 with no documented ADRs.   Propoxyphene Other (See Comments)    hallucination   Trazodone Other (See Comments)    hallucination     REVIEW OF SYSTEMS (Negative unless  checked)  Constitutional: [] Weight loss  [] Fever  [] Chills Cardiac: [] Chest pain   [] Chest pressure   [] Palpitations   [] Shortness of breath when laying flat   [] Shortness of breath with exertion. Vascular:  [] Pain in legs with walking   [] Pain in legs at rest  [] History of DVT   [] Phlebitis   [] Swelling in legs   [] Varicose veins   [] Non-healing ulcers Pulmonary:   [] Uses home oxygen   [] Productive cough   [] Hemoptysis   [] Wheeze  [] COPD   [] Asthma Neurologic:  [] Dizziness   [] Seizures   [] History of stroke   [] History of TIA  [] Aphasia   [] Vissual changes   [] Weakness or numbness in arm   [] Weakness or numbness in leg Musculoskeletal:   [] Joint swelling   [] Joint pain   [] Low back pain Hematologic:  [] Easy bruising  [] Easy bleeding   [] Hypercoagulable state   [] Anemic Gastrointestinal:  [] Diarrhea   [] Vomiting  [] Gastroesophageal reflux/heartburn   [] Difficulty swallowing. Genitourinary:  [x] Chronic kidney disease   [] Difficult urination  [] Frequent urination   [] Blood in urine Skin:  [] Rashes   [] Ulcers  Psychological:  [] History of anxiety   []  History of major depression.  Physical Examination  There were no vitals filed for this visit. There is no height or weight on file to calculate BMI. Gen: WD/WN, NAD Head: [place]/AT, No temporalis wasting.  Ear/Nose/Throat: Hearing grossly intact, nares w/o erythema or drainage Eyes: PER, EOMI, sclera nonicteric.  Neck: Supple, no gross masses or lesions.  No JVD.  Pulmonary:  Good air movement, no audible wheezing, no use of accessory muscles.  Cardiac: RRR, precordium non-hyperdynamic. Vascular:   *** Vessel Right Left  Radial Palpable Palpable  Brachial Palpable Palpable  Gastrointestinal: soft, non-distended. No guarding/no peritoneal signs.  Musculoskeletal: M/S 5/5 throughout.  No deformity.  Neurologic: CN 2-12 intact. Pain and light touch intact in extremities.  Symmetrical.  Speech is fluent. Motor exam as listed above. Psychiatric:  Judgment intact, Mood & affect appropriate for pt's clinical situation. Dermatologic: No rashes or ulcers noted.  No changes consistent with cellulitis.   CBC Lab Results  Component Value Date   WBC 14.0 (H) 05/23/2023   HGB 15.6 (H) 05/25/2023   HCT 46.0 05/25/2023   MCV 98.3 05/23/2023   PLT 508 (H) 05/23/2023    BMET    Component Value Date/Time   NA 139 05/25/2023 0853   NA 137 12/07/2012 1641   K 3.2 (L) 05/25/2023 0853   K 4.2 12/07/2012 1641   CL 96 (L) 05/25/2023 0853   CL 106 12/07/2012 1641   CO2 26 05/23/2023 1429   CO2 27 12/07/2012 1641   GLUCOSE 96 05/25/2023 0853   GLUCOSE 75 12/07/2012 1641   BUN 14 05/25/2023 0853   BUN 18 12/07/2012 1641   CREATININE 7.80 (H) 05/25/2023 0853   CREATININE 1.13  12/07/2012 1641   CALCIUM 8.5 (L) 05/23/2023 1429   CALCIUM 8.9 12/07/2012 1641   GFRNONAA 6 (L) 05/23/2023 1429   GFRNONAA 45 (L) 12/07/2012 1641   GFRAA 25 (L) 03/30/2020 1026   GFRAA 52 (L) 12/07/2012 1641   CrCl cannot be calculated (Patient's most recent lab result is older than the maximum 21 days allowed.).  COAG Lab Results  Component Value Date   INR 1.1 12/10/2021   INR 1.0 10/12/2021   INR 1.1 05/15/2021    Radiology No results found.   Assessment/Plan There are no diagnoses linked to this encounter.   Levora Dredge, MD  08/06/2023 8:55 AM

## 2023-08-08 ENCOUNTER — Encounter (INDEPENDENT_AMBULATORY_CARE_PROVIDER_SITE_OTHER): Payer: Self-pay | Admitting: Vascular Surgery

## 2023-08-22 ENCOUNTER — Encounter: Payer: Self-pay | Admitting: Neurology

## 2023-08-22 ENCOUNTER — Ambulatory Visit (INDEPENDENT_AMBULATORY_CARE_PROVIDER_SITE_OTHER): Payer: Medicare Other | Admitting: Neurology

## 2023-08-22 VITALS — BP 139/68 | HR 74 | Ht 60.0 in | Wt 135.0 lb

## 2023-08-22 DIAGNOSIS — G6289 Other specified polyneuropathies: Secondary | ICD-10-CM

## 2023-08-22 DIAGNOSIS — Z5181 Encounter for therapeutic drug level monitoring: Secondary | ICD-10-CM

## 2023-08-22 DIAGNOSIS — G2581 Restless legs syndrome: Secondary | ICD-10-CM | POA: Diagnosis not present

## 2023-08-22 MED ORDER — ROPINIROLE HCL 0.5 MG PO TABS: 0.5000 mg | ORAL_TABLET | Freq: Two times a day (BID) | ORAL | 3 refills | Status: AC

## 2023-08-22 NOTE — Progress Notes (Signed)
[facility]  PATIENT: Cassandra [name] DOB: 03-26-1927  REQUESTING CLINICIAN: Danella Penton, MD HISTORY FROM: Patient  REASON FOR VISIT: Stroke    HISTORICAL  CHIEF COMPLAINT:  Chief Complaint  Patient presents with   Room 12    Pt is here husband . Pt states that her feet have a burning sensation. Pr states that she still has RLS. Pt states that she is on dialysis since June of this year.    INTERVAL HISTORY 08/22/2023 Patient presents today for follow-up, she is accompanied by her husband, last visit was in March 2023.  At that time she presented with acute right basal ganglia stroke.  She did well in terms of the stroke.  Today she is complaining of worsening restless leg syndrome and neuropathy.  She has now end-stage renal disease and on dialysis Tuesday Thursday and Saturday.  For her neuropathy and restless leg syndrome she is on gabapentin 100/100/200 mg and Ropinirole 0.5 mg nightly.  She tells me that her symptoms are worse at night and now feels like she needs to increase her medication.   HISTORY OF PRESENT ILLNESS:  89 year old woman with vascular risk factor including diabetes mellitus, hypertension, hyperlipidemia who is presenting to the clinic after being admitted to the hospital following a right hemispheric stroke.  Patient presented to the ED due to sudden onset of left-sided weakness and garbled speech, CT head did not show any acute finding but MRI of the brain showed acute ischemic infarcts involving the right basal ganglia, right occipital lobe and right cerebellar hemisphere.  Her angiogram shows severe right distal P2 P3 stenosis and occlusion of the right vertebral artery.  Her strokes were multifocal and were due to likely dehydration or drop in blood pressure.  She was loaded on aspirin 325 mg and started on aspirin 81 mg daily, her echocardiogram show EF of 60 to 65%, she also had an EEG which was negative for any acute seizure.  Her  left-sided weakness improved, she was seen and cleared by PT for an acute rehab. Since leaving the hospital, patient reported her symptoms improved, currently she is getting physical therapy once a week but knows to continue self physical therapy at home.    OTHER MEDICAL CONDITIONS: Hypertension, hypothyroidism, hyperlipidemia, RLS, Diabetes    REVIEW OF SYSTEMS: Full 14 system review of systems performed and negative with exception of: as noted in the HPI   ALLERGIES: Allergies  Allergen Reactions   Atorvastatin Other (See Comments)    MYALGIA   Cyclobenzaprine Other (See Comments)    hallucination   Hydrocodone-Acetaminophen     Other reaction(s): Hallucination when taking whole pill, pt tolerates taking 1/2 pill   Mirtazapine     Other reaction(s): Hallucination   Oxycodone-Acetaminophen Other (See Comments)    hallucination   Paroxetine Hcl Other (See Comments)    hallucination   Penicillins Swelling    Lip and orbital swelling Tolerated 3rd generation cephalosporin (CEFTRIAXONE) between 03/05/2023 and 03/09/2023 with no documented ADRs.   Propoxyphene Other (See Comments)    hallucination   Trazodone Other (See Comments)    hallucination    HOME MEDICATIONS: Outpatient Medications Prior to Visit  Medication Sig Dispense Refill   ALPRAZolam (XANAX) 0.25 MG tablet Take 1 tablet (0.25 mg total) by mouth daily as needed. 10 tablet 0   amLODipine (NORVASC) 10 MG tablet Take 10 mg by mouth daily.     aspirin EC 81 MG EC tablet Take 1 tablet (81 mg  total) by mouth daily. Swallow whole. 30 tablet 11   cetirizine (ZYRTEC) 10 MG tablet Take 1 tablet by mouth daily as needed for allergies.     cloNIDine (CATAPRES) 0.1 MG tablet Take 1 tablet (0.1 mg total) by mouth 2 (two) times daily. (Patient taking differently: Take 0.1 mg by mouth 2 (two) times daily as needed.) 60 tablet 0   [facility] D3 25 MCG (1000 UT) capsule Take 1,000 Units by mouth daily.     doxycycline (VIBRAMYCIN) 100 MG  capsule Take 1 capsule (100 mg total) by mouth 2 (two) times daily. 14 capsule 0   DROXIA 300 MG capsule TAKE 1 CAPSULE (300 MG TOTAL) BY MOUTH DAILY. MAY TAKE WITH FOOD TO MINIMIZE GI SIDE EFFECTS. 30 capsule 0   gabapentin (NEURONTIN) 100 MG capsule Take 2 capsules (200 mg total) by mouth at bedtime. (Patient taking differently: Take 100-200 mg by mouth 3 (three) times daily. 100 mg am 100 mg afternoon 200 mg bedtime) 60 capsule 0   glucose blood test strip Use once daily. Use as instructed.     latanoprost (XALATAN) 0.005 % ophthalmic solution Place 1 drop into both eyes at bedtime.     levothyroxine (SYNTHROID) 88 MCG tablet Take 88 mcg by mouth daily. Take on an empty stomach 30 to 60 minutes before breakfast     multivitamin (RENA-VIT) TABS tablet Take 1 tablet by mouth at bedtime.  0   Nutritional Supplements (FEEDING SUPPLEMENT, NEPRO CARB STEADY,) LIQD Take 237 mLs by mouth 2 (two) times daily between meals.  0   rosuvastatin (CRESTOR) 20 MG tablet Take 20 mg by mouth daily.     timolol (TIMOPTIC-XR) 0.5 % ophthalmic gel-forming Place 1 drop into both eyes every morning.     traMADol (ULTRAM) 50 MG tablet Take 1 tablet (50 mg total) by mouth every 6 (six) hours as needed. 26 tablet 0   rOPINIRole (REQUIP) 0.5 MG tablet Take 0.5 mg by mouth at bedtime.     No facility-administered medications prior to visit.    PAST MEDICAL HISTORY: Past Medical History:  Diagnosis Date   Anxiety    Aortic atherosclerosis (HCC)    Arthritis    osteoarthritis in hands, knees ( R torn cartilage, L meniscus removed)    Atherosclerosis of native coronary artery of native heart with unstable angina pectoris (HCC)    CAD (coronary artery disease) 1999   a.) s/p PCI (details unkown) in [place] in 1999   Campylobacter antigen positive 11/29/2014   a.) presented to [facility] ED with N/V/D; stool culture (+)   ESRD on hemodialysis (HCC)    a.) T-Th-Sat   Glaucoma    Hypercholesterolemia     Hyperlipidemia    Hypertension    Hypothyroidism    Lumbar stenosis with neurogenic claudication    Perforation of colon as colonoscopy complication (HCC) 2010   a.) required short term colostomy with subsequent take down/reversal   RLS (restless legs syndrome)    Thrombocytosis    Type 2 diabetes mellitus with chronic kidney disease (HCC)     PAST SURGICAL HISTORY: Past Surgical History:  Procedure Laterality Date   ABDOMINAL HYSTERECTOMY     left ovary removed   AV FISTULA PLACEMENT Left 05/25/2023   Procedure: ARTERIOVENOUS (AV) FISTULA CREATION (BRACHIALCEPHALIC);  Surgeon: Renford Dills, MD;  Location: [facility] ORS;  Service: Vascular;  Laterality: Left;   BLEPHAROPLASTY Bilateral    CATARACT EXTRACTION W/ INTRAOCULAR LENS  IMPLANT, BILATERAL  COLONOSCOPY  2010   COLOSTOMY  03/2009   perforated colon from colonoscopy   COLOSTOMY REVERSAL  08/2009   CORONARY ANGIOPLASTY WITH STENT PLACEMENT  1999   DIALYSIS/PERMA CATHETER INSERTION N/A 03/08/2023   Procedure: DIALYSIS/PERMA CATHETER INSERTION;  Surgeon: Annice Needy, MD;  Location: [facility] INVASIVE CV LAB;  Service: Cardiovascular;  Laterality: N/A;   FOOT SURGERY Bilateral    toes correction   KNEE ARTHROSCOPY Right    torn cartilage   KNEE ARTHROSCOPY W/ MENISCECTOMY Left 01/2012   TEMPORARY DIALYSIS CATHETER N/A 03/05/2023   Procedure: TEMPORARY DIALYSIS CATHETER;  Surgeon: Annice Needy, MD;  Location: [facility] INVAS[facility] CV LAB;  Service: Cardiovascular;  Laterality: N/A;    FAMILY HISTORY: Family History  Problem Relation Age of Onset   Hypertension Mother    Hypertension Father    Early death Father    Heart disease Father     SOCIAL HISTORY: Social History   Socioeconomic History   Marital status: Married    Spouse name: Chrissie Noa   Number of children: 3   Years of education: Not on file   Highest education level: Not on file  Occupational History   Not on file  Tobacco Use   Smoking status: Former     Current packs/day: 0.00    Types: Cigarettes    Quit date: 01/21/1980    Years since quitting: 43.6   Smokeless tobacco: Never  Vaping Use   Vaping status: Never Used  Substance and Sexual Activity   Alcohol use: Yes    Alcohol/week: 0.0 standard drinks of alcohol    Comment: occasionally   Drug use: No   Sexual activity: Never  Other Topics Concern   Not on file  Social History Narrative   Not on file   Social Determinants of Health   Financial Resource Strain: Low Risk  (06/20/2023)   Received from [facility]   Overall Financial Resource Strain (CARDIA)    Difficulty of Paying Living Expenses: Not hard at all  Food Insecurity: No Food Insecurity (06/20/2023)   Received from [facility]   Hunger Vital Sign    Worried About Running Out of Food in the Last Year: Never true    Ran Out of Food in the Last Year: Never true  Transportation Needs: No Transportation Needs (06/20/2023)   Received from [facility] - Transportation    In the past 12 months, has lack of transportation kept you from medical appointments or from getting medications?: No    Lack of Transportation (Non-Medical): No  Physical Activity: Not on file  Stress: Not on file  Social Connections: Not on file  Intimate Partner Violence: Not At Risk (03/06/2023)   Humiliation, Afraid, Rape, and Kick questionnaire    Fear of Current or Ex-Partner: No    Emotionally Abused: No    Physically Abused: No    Sexually Abused: No    PHYSICAL EXAM  GENERAL EXAM/CONSTITUTIONAL: Vitals:  Vitals:   08/22/23 1154  BP: 139/68  Pulse: 74  Weight: 135 lb (61.2 kg)  Height: 5' (1.524 m)    Body mass index is 26.37 kg/m. Wt Readings from Last 3 Encounters:  08/22/23 135 lb (61.2 kg)  07/02/23 129 lb 6.4 oz (58.7 kg)  06/11/23 127 lb 6.4 oz (57.8 kg)   Patient is in no distress; well developed, nourished and groomed; neck is  supple  MUSCULOSKELETAL: Gait, strength, tone, movements noted in Neurologic exam  below  NEUROLOGIC: MENTAL STATUS:      No data to display         awake, alert, oriented to person, place and time recent and remote memory intact normal attention and concentration language fluent, comprehension intact, naming intact fund of knowledge appropriate  CRANIAL NERVE:  2nd, 3rd, 4th, 6th - visual fields full to confrontation, extraocular muscles intact, no nystagmus 5th - facial sensation symmetric 7th - facial strength symmetric 8th - hearing intact 9th - palate elevates symmetrically, uvula midline 11th - shoulder shrug symmetric 12th - tongue protrusion midline  MOTOR:  normal bulk and tone, full strength in the BUE, BLE.    SENSORY:  normal and symmetric to light touch  COORDINATION:  finger-nose-finger, fine finger movements normal  GAIT/STATION:  Uses a rolling walker    DIAGNOSTIC DATA (LABS, IMAGING, TESTING) - I reviewed patient records, labs, notes, testing and imaging myself where available.  Lab Results  Component Value Date   WBC 14.0 (H) 05/23/2023   HGB 15.6 (H) 05/25/2023   HCT 46.0 05/25/2023   MCV 98.3 05/23/2023   PLT 508 (H) 05/23/2023      Component Value Date/Time   NA 139 05/25/2023 0853   NA 137 12/07/2012 1641   K 3.2 (L) 05/25/2023 0853   K 4.2 12/07/2012 1641   CL 96 (L) 05/25/2023 0853   CL 106 12/07/2012 1641   CO2 26 05/23/2023 1429   CO2 27 12/07/2012 1641   GLUCOSE 96 05/25/2023 0853   GLUCOSE 75 12/07/2012 1641   BUN 14 05/25/2023 0853   BUN 18 12/07/2012 1641   CREATININE 7.80 (H) 05/25/2023 0853   CREATININE 1.13 12/07/2012 1641   CALCIUM 8.5 (L) 05/23/2023 1429   CALCIUM 8.9 12/07/2012 1641   PROT 6.5 03/06/2023 0609   PROT 8.6 (H) 12/07/2012 1641   ALBUMIN 2.4 (L) 03/13/2023 0742   ALBUMIN 3.2 (L) 12/07/2012 1641   AST 28 03/06/2023 0609   AST 37 12/07/2012 1641   ALT 12 03/06/2023 0609   ALT 33 12/07/2012 1641    ALKPHOS 46 03/06/2023 0609   ALKPHOS 72 12/07/2012 1641   BILITOT 1.0 03/06/2023 0609   BILITOT 0.4 12/07/2012 1641   GFRNONAA 6 (L) 05/23/2023 1429   GFRNONAA 45 (L) 12/07/2012 1641   GFRAA 25 (L) 03/30/2020 1026   GFRAA 52 (L) 12/07/2012 1641   Lab Results  Component Value Date   CHOL 176 10/13/2021   HDL 44 10/13/2021   LDLCALC 115 (H) 10/13/2021   TRIG 83 10/13/2021   CHOLHDL 4.0 10/13/2021   Lab Results  Component Value Date   HGBA1C 6.4 (H) 10/13/2021   HGBA1C 6.4 (H) 10/13/2021   Lab Results  Component Value Date   VITAMINB12 324 10/13/2021   Lab Results  Component Value Date   TSH 12.489 (H) 10/13/2021    MRI HEAD IMPRESSION: 1. Patchy small volume acute ischemic infarcts involving the right basal ganglia, right occipital lobe, and right cerebellar hemisphere as above. No associated hemorrhage or mass effect. 2. Underlying age-related cerebral atrophy with mild chronic small vessel ischemic disease.   MRA HEAD IMPRESSION: 1. Technically limited exam due to motion artifact. 2. 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 Attenuated flow within the distal right V4 segment suspected to be retrograde in nature across the vertebrobasilar junction. 3. Severe distal right P2/P3 stenosis. 4. Otherwise grossly patent intracranial circulation. No other hemodynamically significant or correctable stenosis.   MRA NECK  IMPRESSION: 1. Severely limited and nearly nondiagnostic study due to extensive motion artifact. 2. Possible focal severe high-grade stenosis involving the proximal cervical right ICA. Finding not entirely certain and may simply be due to vascular tortuosity given the technical limitations of this exam. Correlation with carotid Doppler ultrasound and/or CTA could be performed for further evaluation as warranted. 3. No appreciable flow-limiting stenosis about the left carotid artery system on this limited exam. 4. Left vertebral  artery dominant and grossly patent within the neck. Attenuated and irregular flow within the diminutive right vertebral artery. Again, the right vertebral artery appears to largely occluded at the skull base are corresponding intracranial MRA.   Carotid US  1. Elevated peak systolic velocity in the left internal carotid artery indicative of 50-69% stenosis. 2. Less than 50% stenosis of the right internal carotid artery.  ASSESSMENT AND PLAN  89 y.o. year old female with vascular risk factors including hypertension, hyperlipidemia, diabetes mellitus, hypothyroidism, RLS and neuropathy who is presenting with worsening of her restless leg syndrome.  She is on ropinirole 0.5 mg nightly, will increase to 0.5 mg twice daily.  For the gabapentin, she is on the maximum dose for end-stage renal dialysis.  I did recommend her to take on dialysis day 200 mg after dialysis and 200 mg in the evening and nondialysis day to continue with 100/100/200 mg.  She voiced understanding.  I will see her in 6 to 8 months for follow-up or sooner if worse, we will also obtain iron studies today.    1. Restless leg syndrome   2. Other polyneuropathy   3. Therapeutic drug monitoring      Patient Instructions  Increase ropinirole to 0.5 mg twice daily On dialysis day, take gabapentin 200 mg after dialysis and 200 mg in the evening On nondialysis day, take gabapentin 100/100/200 mg Iron studies today with gabapentin level Follow-up in 6 to 8 months or sooner if worse.  Orders Placed This Encounter  Procedures   Iron, TIBC and Ferritin Panel   Gabapentin level   Gabapentin level    Meds ordered this encounter  Medications   rOPINIRole (REQUIP) 0.5 MG tablet    Sig: Take 1 tablet (0.5 mg total) by mouth in the morning and at bedtime.    Dispense:  180 tablet    Refill:  3    Return in about 6 months (around 02/19/2024).   Windell Norfolk, MD 08/22/2023, 12:56 PM  [facility] [address], [address] [place], [place] [address] [phone]

## 2023-08-22 NOTE — Patient Instructions (Signed)
Increase ropinirole to 0.5 mg twice daily On dialysis day, take gabapentin 200 mg after dialysis and 200 mg in the evening On nondialysis day, take gabapentin 100/100/200 mg Iron studies today with gabapentin level Follow-up in 6 to 8 months or sooner if worse.

## 2023-08-23 LAB — IRON,TIBC AND FERRITIN PANEL
Ferritin: 505 ng/mL — ABNORMAL HIGH (ref 15–150)
Iron Saturation: 13 % — ABNORMAL LOW (ref 15–55)
Iron: 31 ug/dL (ref 27–139)
Total Iron Binding Capacity: 238 ug/dL — ABNORMAL LOW (ref 250–450)
UIBC: 207 ug/dL (ref 118–369)

## 2023-08-24 ENCOUNTER — Other Ambulatory Visit: Payer: Self-pay

## 2023-08-24 ENCOUNTER — Emergency Department: Payer: Medicare Other

## 2023-08-24 ENCOUNTER — Observation Stay
Admission: EM | Admit: 2023-08-24 | Discharge: 2023-08-27 | Disposition: A | Discharge status: Home / Self Care | Payer: Medicare Other | Attending: Internal Medicine | Admitting: Internal Medicine

## 2023-08-24 DIAGNOSIS — I2510 Atherosclerotic heart disease of native coronary artery without angina pectoris: Secondary | ICD-10-CM | POA: Diagnosis present

## 2023-08-24 DIAGNOSIS — Z933 Colostomy status: Secondary | ICD-10-CM | POA: Diagnosis not present

## 2023-08-24 DIAGNOSIS — E039 Hypothyroidism, unspecified: Secondary | ICD-10-CM | POA: Diagnosis not present

## 2023-08-24 DIAGNOSIS — Z7982 Long term (current) use of aspirin: Secondary | ICD-10-CM | POA: Diagnosis not present

## 2023-08-24 DIAGNOSIS — Z79899 Other long term (current) drug therapy: Secondary | ICD-10-CM | POA: Diagnosis not present

## 2023-08-24 DIAGNOSIS — I10 Essential (primary) hypertension: Secondary | ICD-10-CM | POA: Diagnosis present

## 2023-08-24 DIAGNOSIS — Z87891 Personal history of nicotine dependence: Secondary | ICD-10-CM | POA: Diagnosis not present

## 2023-08-24 DIAGNOSIS — E1122 Type 2 diabetes mellitus with diabetic chronic kidney disease: Secondary | ICD-10-CM

## 2023-08-24 DIAGNOSIS — M6281 Muscle weakness (generalized): Secondary | ICD-10-CM | POA: Insufficient documentation

## 2023-08-24 DIAGNOSIS — Z955 Presence of coronary angioplasty implant and graft: Secondary | ICD-10-CM | POA: Diagnosis not present

## 2023-08-24 DIAGNOSIS — R2681 Unsteadiness on feet: Secondary | ICD-10-CM | POA: Insufficient documentation

## 2023-08-24 DIAGNOSIS — I120 Hypertensive chronic kidney disease with stage 5 chronic kidney disease or end stage renal disease: Secondary | ICD-10-CM | POA: Insufficient documentation

## 2023-08-24 DIAGNOSIS — D45 Polycythemia vera: Secondary | ICD-10-CM | POA: Diagnosis present

## 2023-08-24 DIAGNOSIS — E875 Hyperkalemia: Secondary | ICD-10-CM | POA: Insufficient documentation

## 2023-08-24 DIAGNOSIS — R2689 Other abnormalities of gait and mobility: Secondary | ICD-10-CM | POA: Insufficient documentation

## 2023-08-24 DIAGNOSIS — E119 Type 2 diabetes mellitus without complications: Secondary | ICD-10-CM

## 2023-08-24 DIAGNOSIS — G629 Polyneuropathy, unspecified: Secondary | ICD-10-CM | POA: Diagnosis not present

## 2023-08-24 DIAGNOSIS — N186 End stage renal disease: Secondary | ICD-10-CM

## 2023-08-24 DIAGNOSIS — D72829 Elevated white blood cell count, unspecified: Secondary | ICD-10-CM | POA: Diagnosis not present

## 2023-08-24 DIAGNOSIS — J9601 Acute respiratory failure with hypoxia: Principal | ICD-10-CM | POA: Diagnosis present

## 2023-08-24 DIAGNOSIS — Z992 Dependence on renal dialysis: Secondary | ICD-10-CM | POA: Diagnosis not present

## 2023-08-24 DIAGNOSIS — R0602 Shortness of breath: Secondary | ICD-10-CM | POA: Diagnosis present

## 2023-08-24 LAB — CBC WITH DIFFERENTIAL/PLATELET
Abs Immature Granulocytes: 0.63 10*3/uL — ABNORMAL HIGH (ref 0.00–0.07)
Basophils Absolute: 0.2 10*3/uL — ABNORMAL HIGH (ref 0.0–0.1)
Basophils Relative: 1 %
Eosinophils Absolute: 0 10*3/uL (ref 0.0–0.5)
Eosinophils Relative: 0 %
HCT: 37.3 % (ref 36.0–46.0)
Hemoglobin: 12.2 g/dL (ref 12.0–15.0)
Immature Granulocytes: 2 %
Lymphocytes Relative: 3 %
Lymphs Abs: 1.1 10*3/uL (ref 0.7–4.0)
MCH: 33.2 pg (ref 26.0–34.0)
MCHC: 32.7 g/dL (ref 30.0–36.0)
MCV: 101.4 fL — ABNORMAL HIGH (ref 80.0–100.0)
Monocytes Absolute: 3 10*3/uL — ABNORMAL HIGH (ref 0.1–1.0)
Monocytes Relative: 9 %
Neutro Abs: 26.8 10*3/uL — ABNORMAL HIGH (ref 1.7–7.7)
Neutrophils Relative %: 85 %
Platelets: 409 10*3/uL — ABNORMAL HIGH (ref 150–400)
RBC: 3.68 MIL/uL — ABNORMAL LOW (ref 3.87–5.11)
RDW: 15.7 % — ABNORMAL HIGH (ref 11.5–15.5)
Smear Review: NORMAL
WBC: 31.7 10*3/uL — ABNORMAL HIGH (ref 4.0–10.5)
nRBC: 0 % (ref 0.0–0.2)

## 2023-08-24 LAB — CBC
HCT: 36.7 % (ref 36.0–46.0)
Hemoglobin: 12.1 g/dL (ref 12.0–15.0)
MCH: 32.6 pg (ref 26.0–34.0)
MCHC: 33 g/dL (ref 30.0–36.0)
MCV: 98.9 fL (ref 80.0–100.0)
Platelets: 409 10*3/uL — ABNORMAL HIGH (ref 150–400)
RBC: 3.71 MIL/uL — ABNORMAL LOW (ref 3.87–5.11)
RDW: 15.6 % — ABNORMAL HIGH (ref 11.5–15.5)
WBC: 32.5 10*3/uL — ABNORMAL HIGH (ref 4.0–10.5)
nRBC: 0 % (ref 0.0–0.2)

## 2023-08-24 LAB — TROPONIN I (HIGH SENSITIVITY): Troponin I (High Sensitivity): 32 ng/L — ABNORMAL HIGH (ref <18)

## 2023-08-24 LAB — COMPREHENSIVE METABOLIC PANEL
ALT: 17 U/L (ref 0–44)
AST: 29 U/L (ref 15–41)
Albumin: 3.2 g/dL — ABNORMAL LOW (ref 3.5–5.0)
Alkaline Phosphatase: 98 U/L (ref 38–126)
Anion gap: 16 — ABNORMAL HIGH (ref 5–15)
BUN: 54 mg/dL — ABNORMAL HIGH (ref 8–23)
CO2: 20 mmol/L — ABNORMAL LOW (ref 22–32)
Calcium: 7.9 mg/dL — ABNORMAL LOW (ref 8.9–10.3)
Chloride: 98 mmol/L (ref 98–111)
Creatinine, Ser: 7.67 mg/dL — ABNORMAL HIGH (ref 0.44–1.00)
GFR, Estimated: 5 mL/min — ABNORMAL LOW (ref >60)
Glucose, Bld: 198 mg/dL — ABNORMAL HIGH (ref 70–99)
Potassium: 4.9 mmol/L (ref 3.5–5.1)
Sodium: 134 mmol/L — ABNORMAL LOW (ref 135–145)
Total Bilirubin: 0.9 mg/dL (ref <1.2)
Total Protein: 8 g/dL (ref 6.5–8.1)

## 2023-08-24 LAB — BRAIN NATRIURETIC PEPTIDE: B Natriuretic Peptide: 429.9 pg/mL — ABNORMAL HIGH (ref 0.0–100.0)

## 2023-08-24 LAB — PROCALCITONIN: Procalcitonin: 1.95 ng/mL

## 2023-08-24 LAB — GABAPENTIN LEVEL: Gabapentin Lvl: 25.7 ug/mL (ref 4.0–16.0)

## 2023-08-24 MED ORDER — ONDANSETRON HCL 4 MG PO TABS: 4.0000 mg | ORAL_TABLET | Freq: Four times a day (QID) | ORAL | Status: DC | PRN

## 2023-08-24 MED ORDER — SODIUM CHLORIDE 0.9% FLUSH
3.0000 mL | Freq: Two times a day (BID) | INTRAVENOUS | Status: DC
  Administered 2023-08-25 – 2023-08-26 (×4): 3 mL via INTRAVENOUS

## 2023-08-24 MED ORDER — LEVOTHYROXINE SODIUM 88 MCG PO TABS
88.0000 ug | ORAL_TABLET | Freq: Every day | ORAL | Status: DC
  Administered 2023-08-26 – 2023-08-27 (×2): 88 ug via ORAL
  Filled 2023-08-24 (×3): qty 1

## 2023-08-24 MED ORDER — AMLODIPINE BESYLATE 10 MG PO TABS
10.0000 mg | ORAL_TABLET | Freq: Every day | ORAL | Status: DC
  Administered 2023-08-25 – 2023-08-27 (×2): 10 mg via ORAL
  Filled 2023-08-24 (×3): qty 1

## 2023-08-24 MED ORDER — CHLORHEXIDINE GLUCONATE CLOTH 2 % EX PADS
6.0000 | MEDICATED_PAD | Freq: Every day | CUTANEOUS | Status: DC
  Administered 2023-08-26 – 2023-08-27 (×2): 6 via TOPICAL
  Filled 2023-08-24: qty 6

## 2023-08-24 MED ORDER — HYDROXYUREA 300 MG PO CAPS
300.0000 mg | ORAL_CAPSULE | Freq: Every day | ORAL | Status: DC
  Administered 2023-08-26 – 2023-08-27 (×2): 300 mg via ORAL
  Filled 2023-08-24 (×4): qty 1

## 2023-08-24 MED ORDER — HEPARIN SODIUM (PORCINE) 5000 UNIT/ML IJ SOLN
5000.0000 [IU] | Freq: Three times a day (TID) | INTRAMUSCULAR | Status: DC
  Administered 2023-08-25 – 2023-08-27 (×7): 5000 [IU] via SUBCUTANEOUS
  Filled 2023-08-24 (×7): qty 1

## 2023-08-24 MED ORDER — ROPINIROLE HCL 1 MG PO TABS
0.5000 mg | ORAL_TABLET | Freq: Two times a day (BID) | ORAL | Status: DC
Start: 2023-08-24 — End: 2023-08-27
  Administered 2023-08-25 – 2023-08-27 (×5): 0.5 mg via ORAL
  Filled 2023-08-24: qty 1
  Filled 2023-08-24: qty 2
  Filled 2023-08-24: qty 1
  Filled 2023-08-24: qty 2
  Filled 2023-08-24 (×3): qty 1

## 2023-08-24 MED ORDER — TIMOLOL MALEATE 0.5 % OP SOLN
1.0000 [drp] | Freq: Every morning | OPHTHALMIC | Status: DC
  Administered 2023-08-26: 1 [drp] via OPHTHALMIC
  Filled 2023-08-24 (×2): qty 5

## 2023-08-24 MED ORDER — ROSUVASTATIN CALCIUM 10 MG PO TABS
20.0000 mg | ORAL_TABLET | Freq: Every day | ORAL | Status: DC
  Administered 2023-08-25 – 2023-08-27 (×3): 20 mg via ORAL
  Filled 2023-08-24: qty 2
  Filled 2023-08-24: qty 1
  Filled 2023-08-24 (×2): qty 2

## 2023-08-24 MED ORDER — GABAPENTIN 100 MG PO CAPS
200.0000 mg | ORAL_CAPSULE | Freq: Every day | ORAL | Status: DC
  Administered 2023-08-25 – 2023-08-26 (×2): 200 mg via ORAL
  Filled 2023-08-24 (×2): qty 2

## 2023-08-24 MED ORDER — ACETAMINOPHEN 650 MG RE SUPP: 650.0000 mg | Freq: Four times a day (QID) | RECTAL | Status: DC | PRN

## 2023-08-24 MED ORDER — ALPRAZOLAM 0.5 MG PO TABS
0.2500 mg | ORAL_TABLET | Freq: Two times a day (BID) | ORAL | Status: DC | PRN
  Administered 2023-08-25 – 2023-08-27 (×3): 0.25 mg via ORAL
  Filled 2023-08-24 (×3): qty 1

## 2023-08-24 MED ORDER — GABAPENTIN 100 MG PO CAPS: 100.0000 mg | ORAL_CAPSULE | Freq: Three times a day (TID) | ORAL | Status: DC

## 2023-08-24 MED ORDER — ASPIRIN 81 MG PO TBEC
81.0000 mg | DELAYED_RELEASE_TABLET | Freq: Every day | ORAL | Status: DC
  Administered 2023-08-24 – 2023-08-27 (×4): 81 mg via ORAL
  Filled 2023-08-24 (×4): qty 1

## 2023-08-24 MED ORDER — FUROSEMIDE 10 MG/ML IJ SOLN
80.0000 mg | Freq: Once | INTRAMUSCULAR | Status: AC
  Administered 2023-08-24: 80 mg via INTRAVENOUS
  Filled 2023-08-24: qty 8

## 2023-08-24 MED ORDER — ACETAMINOPHEN 325 MG PO TABS
650.0000 mg | ORAL_TABLET | Freq: Four times a day (QID) | ORAL | Status: DC | PRN
  Administered 2023-08-25 – 2023-08-26 (×4): 650 mg via ORAL
  Filled 2023-08-24 (×4): qty 2

## 2023-08-24 MED ORDER — LATANOPROST 0.005 % OP SOLN
1.0000 [drp] | Freq: Every day | OPHTHALMIC | Status: DC
  Administered 2023-08-26: 1 [drp] via OPHTHALMIC
  Filled 2023-08-24 (×2): qty 2.5

## 2023-08-24 MED ORDER — ONDANSETRON HCL 4 MG/2ML IJ SOLN: 4.0000 mg | Freq: Four times a day (QID) | INTRAMUSCULAR | Status: DC | PRN

## 2023-08-24 MED ORDER — GABAPENTIN 100 MG PO CAPS
100.0000 mg | ORAL_CAPSULE | Freq: Two times a day (BID) | ORAL | Status: DC
  Administered 2023-08-25 – 2023-08-27 (×4): 100 mg via ORAL
  Filled 2023-08-24 (×4): qty 1

## 2023-08-24 NOTE — Assessment & Plan Note (Signed)
Troponin is minimally elevated in the setting of ESRD.  No chest pain reported.  EKG at baseline.  - Continue home regimen

## 2023-08-24 NOTE — Assessment & Plan Note (Signed)
Platelets stable at this time.  - Continue home hydroxyurea

## 2023-08-24 NOTE — Assessment & Plan Note (Signed)
-   Continue home Synthroid °

## 2023-08-24 NOTE — Assessment & Plan Note (Signed)
-   Continue home regimen 

## 2023-08-24 NOTE — Assessment & Plan Note (Signed)
Abbreviated dialysis session today due to hypoxia.  - Nephrology consulted; appreciate their recommendations

## 2023-08-24 NOTE — Assessment & Plan Note (Addendum)
Patient presenting with hypoxia requiring 3 L, in the setting of hypervolemia after missed dialysis yesterday (center was closed for holidays).  Given lack of infectious symptoms (fever, cough, malaise, N/V etc), low suspicion for pneumonia despite elevated WBC.  - Continue supplemental oxygen to maintain oxygen saturation above 88% - Wean as tolerated - Lasix 80 mg IV once - Dialysis per nephrology

## 2023-08-24 NOTE — H&P (Signed)
History and Physical    Patient: Cassandra [name] ZOX:[id number] DOB: 09-05-1927 DOA: 08/24/2023 DOS: the patient was seen and examined on 08/24/2023 PCP: Danella Penton, MD  Patient coming from: Home  Chief Complaint:  Chief Complaint  Patient presents with   Shortness of Breath   HPI: Cassandra [name] is a 89 y.o. female with medical history significant of ESRD on HD (TThSa), chronic leukocytosis, type 2 diabetes, hypertension, lumbar stenosis with neurogenic claudication, hypertension, hyperlipidemia, CAD s/p PCI 1999, who presents to the ED due to shortness of breath.  Cassandra [name] states that she was scheduled for dialysis yesterday but the center was closed due to Thanksgiving.  She was set up for a session today, however in the middle of her session, she was noted to have low oxygen levels.  She endorsed shortness of breath at that time.  She notes that yesterday, she felt more tired than usual, but otherwise denies any fever, chills, nausea, vomiting, diarrhea, abdominal pain, cough, runny nose, congestion, chest pain or lower extremity swelling.  She notes that overall, she feels in her usual state of health.  ED course: On arrival to the ED, patient was normotensive at 113/88 with heart rate of 90.  She was saturating at 93% on room air before being weaned down to 3 L.  She was afebrile at 97.9. Initial workup notable for WBC of 32.5, platelets of 409, sodium 134, bicarb 20, glucose 198, BUN 54, creatinine 7.67 with GFR 5.  BNP elevated 429.  Chest x-ray with bilateral interstitial patchy opacities concerning for edema versus atelectasis with small bilateral pleural effusions.  Nephrology consulted for dialysis, [facility] contacted for admission.  Review of Systems: As mentioned in the history of present illness. All other systems reviewed and are negative.  Past Medical History:  Diagnosis Date   Anxiety    Aortic atherosclerosis (HCC)    Arthritis    osteoarthritis in hands, knees (  R torn cartilage, L meniscus removed)    Atherosclerosis of native coronary artery of native heart with unstable angina pectoris (HCC)    CAD (coronary artery disease) 1999   a.) s/p PCI (details unkown) in [place] in 1999   Campylobacter antigen positive 11/29/2014   a.) presented to [facility] ED with N/V/D; stool culture (+)   ESRD on hemodialysis (HCC)    a.) T-Th-Sat   Glaucoma    Hypercholesterolemia    Hyperlipidemia    Hypertension    Hypothyroidism    Lumbar stenosis with neurogenic claudication    Perforation of colon as colonoscopy complication (HCC) 2010   a.) required short term colostomy with subsequent take down/reversal   RLS (restless legs syndrome)    Thrombocytosis    Type 2 diabetes mellitus with chronic kidney disease (HCC)    Past Surgical History:  Procedure Laterality Date   ABDOMINAL HYSTERECTOMY     left ovary removed   AV FISTULA PLACEMENT Left 05/25/2023   Procedure: ARTERIOVENOUS (AV) FISTULA CREATION (BRACHIALCEPHALIC);  Surgeon: Renford Dills, MD;  Location: [facility] ORS;  Service: Vascular;  Laterality: Left;   BLEPHAROPLASTY Bilateral    CATARACT EXTRACTION W/ INTRAOCULAR LENS  IMPLANT, BILATERAL     COLONOSCOPY  2010   COLOSTOMY  03/2009   perforated colon from colonoscopy   COLOSTOMY REVERSAL  08/2009   CORONARY ANGIOPLASTY WITH STENT PLACEMENT  1999   DIALYSIS/PERMA CATHETER INSERTION N/A 03/08/2023   Procedure: DIALYSIS/PERMA CATHETER INSERTION;  Surgeon: Annice Needy, MD;  Location: [facility] INV[facility];  Service: Cardiovascular;  Laterality: N/A;   FOOT SURGERY Bilateral    toes correction   KNEE ARTHROSCOPY Right    torn cartilage   KNEE ARTHROSCOPY W/ MENISCECTOMY Left 01/2012   TEMPORARY DIALYSIS CATHETER N/A 03/05/2023   Procedure: TEMPORARY DIALYSIS CATHETER;  Surgeon: Annice Needy, MD;  Location: [facility];  Service: Cardiovascular;  Laterality: N/A;   Social History:  reports that she quit smoking about 43 years ago. Her  smoking use included cigarettes. She has never used smokeless tobacco. She reports current alcohol use. She reports that she does not use drugs.  Allergies  Allergen Reactions   Atorvastatin Other (See Comments)    MYALGIA   Cyclobenzaprine Other (See Comments)    hallucination   Hydrocodone-Acetaminophen     Other reaction(s): Hallucination when taking whole pill, pt tolerates taking 1/2 pill   Mirtazapine     Other reaction(s): Hallucination   Oxycodone-Acetaminophen Other (See Comments)    hallucination   Paroxetine Hcl Other (See Comments)    hallucination   Penicillins Swelling    Lip and orbital swelling Tolerated 3rd generation cephalosporin (CEFTRIAXONE) between 03/05/2023 and 03/09/2023 with no documented ADRs.   Propoxyphene Other (See Comments)    hallucination   Trazodone Other (See Comments)    hallucination    Family History  Problem Relation Age of Onset   Hypertension Mother    Hypertension Father    Early death Father    Heart disease Father     Prior to Admission medications   Medication Sig Start Date End Date Taking? Authorizing Provider  ALPRAZolam (XANAX) 0.25 MG tablet Take 1 tablet (0.25 mg total) by mouth daily as needed. 03/12/23   [name], DO  amLODipine (NORVASC) 10 MG tablet Take 10 mg by mouth daily. 10/16/22 03/01/24  [provider]  aspirin EC 81 MG EC tablet Take 1 tablet (81 mg total) by mouth daily. Swallow whole. 10/16/21   Gillis Santa, MD  cetirizine (ZYRTEC) 10 MG tablet Take 1 tablet by mouth daily as needed for allergies.    [provider]  cloNIDine (CATAPRES) 0.1 MG tablet Take 1 tablet (0.1 mg total) by mouth 2 (two) times daily. Patient taking differently: Take 0.1 mg by mouth 2 (two) times daily as needed. 10/05/21 05/15/24  Marrion Coy, MD  CVS D3 25 MCG (1000 UT) capsule Take 1,000 Units by mouth daily. 02/21/23   [provider]  doxycycline (VIBRAMYCIN) 100 MG capsule Take 1 capsule (100 mg  total) by mouth 2 (two) times daily. 05/30/23   Georgiana Spinner, NP  DROXIA 300 MG capsule TAKE 1 CAPSULE (300 MG TOTAL) BY MOUTH DAILY. MAY TAKE WITH FOOD TO MINIMIZE GI SIDE EFFECTS. 04/23/20   Alinda Dooms, NP  gabapentin (NEURONTIN) 100 MG capsule Take 2 capsules (200 mg total) by mouth at bedtime. Patient taking differently: Take 100-200 mg by mouth 3 (three) times daily. 100 mg am 100 mg afternoon 200 mg bedtime 03/12/23   [name], DO  glucose blood test strip Use once daily. Use as instructed. 02/21/17   [provider]  latanoprost (XALATAN) 0.005 % ophthalmic solution Place 1 drop into both eyes at bedtime. 09/13/15   [provider]  levothyroxine (SYNTHROID) 88 MCG tablet Take 88 mcg by mouth daily. Take on an empty stomach 30 to 60 minutes before breakfast 12/08/14   [provider]  multivitamin (RENA-VIT) TABS tablet Take 1 tablet by mouth at bedtime. 03/12/23   Lyn Hollingshead,  Dorene Grebe, DO  Nutritional Supplements (FEEDING SUPPLEMENT, NEPRO CARB STEADY,) LIQD Take 237 mLs by mouth 2 (two) times daily between meals. 03/12/23   [name], DO  rOPINIRole (REQUIP) 0.5 MG tablet Take 1 tablet (0.5 mg total) by mouth in the morning and at bedtime. 08/22/23 08/16/24  Windell Norfolk, MD  rosuvastatin (CRESTOR) 20 MG tablet Take 20 mg by mouth daily. 01/07/15   [provider]  timolol (TIMOPTIC-XR) 0.5 % ophthalmic gel-forming Place 1 drop into both eyes every morning. 05/06/21   [provider]  traMADol (ULTRAM) 50 MG tablet Take 1 tablet (50 mg total) by mouth every 6 (six) hours as needed. 05/25/23 05/24/24  Schnier, Latina Craver, MD    Physical Exam: Vitals:   08/24/23 1319 08/24/23 1445 08/24/23 1447 08/24/23 1500  BP: 113/88 (!) 125/108  (!) 132/55  Pulse:    82  Resp:  (!) 27 19 (!) 24  Temp:      SpO2:    91%  Weight:      Height:       Physical Exam Vitals and nursing note reviewed.  Constitutional:      General: She is  not in acute distress.    Appearance: She is normal weight. She is not toxic-appearing.  HENT:     Head: Normocephalic and atraumatic.     Mouth/Throat:     Mouth: Mucous membranes are moist.  Cardiovascular:     Rate and Rhythm: Normal rate and regular rhythm.  Pulmonary:     Effort: No tachypnea.     Breath sounds: Rales (Up to mid lung fields bilaterally) present. No decreased breath sounds or wheezing.  Abdominal:     Palpations: Abdomen is soft.     Tenderness: There is no abdominal tenderness.  Musculoskeletal:     Right lower leg: No edema.     Left lower leg: No edema.  Skin:    General: Skin is warm and dry.  Neurological:     General: No focal deficit present.     Mental Status: She is alert and oriented to person, place, and time.  Psychiatric:        Mood and Affect: Mood normal.        Behavior: Behavior normal.    Data Reviewed: CBC with WBC of 32.5, hemoglobin of 12.1, platelets of 409 CMP with sodium of 134, potassium 4.9, bicarb 20, glucose 198, BUN 54, creatinine 0.67, anion gap 16, AST 29, ALT 17 and GFR 5 BNP 429 Troponin 32  Recent ferritin elevated at 505 Recent iron panel with iron saturation of 13%, iron level of 31 and TBI CO2 38  EKG personally reviewed.  Sinus rhythm with rate of 93.  No ST or T wave changes concerning for acute ischemia.  DG Chest 2 View  Result Date: 08/24/2023 CLINICAL DATA:  Shortness of breath EXAM: CHEST - 2 VIEW COMPARISON:  Chest radiograph dated 03/05/2023 FINDINGS: Right internal jugular venous catheter tip projects over the superior cavoatrial junction. Normal lung volumes. Mild bilateral interstitial and bibasilar patchy opacities. Blunting of the bilateral costophrenic angles. No pneumothorax. Similar mildly enlarged cardiomediastinal silhouette. No acute osseous abnormality. IMPRESSION: 1. Mild bilateral interstitial and bibasilar patchy opacities, may reflect a combination of pulmonary edema and atelectasis. 2. Small  bilateral pleural effusions. Electronically Signed   By: Agustin Cree M.D.   On: 08/24/2023 13:50    Results are pending, will review when available.  Assessment and Plan:  * Acute hypoxic respiratory failure (  HCC) Patient presenting with hypoxia requiring 3 L, in the setting of hypervolemia after missed dialysis yesterday (center was closed for holidays).  Given lack of infectious symptoms (fever, cough, malaise, N/V etc), low suspicion for pneumonia despite elevated WBC.  - Continue supplemental oxygen to maintain oxygen saturation above 88% - Wean as tolerated - Lasix 80 mg IV once - Dialysis per nephrology  Leukocytosis Per chart review, patient has a history of elevated WBC dating back till at least 2020.  Previous highest was 20.6 in September 2024, currently 35.2.  Patient has had a recent epidural steroid injection, however I would not expect this to cause elevation more than a week out.  Given also markedly elevated ferritin and hilar adenopathy seen on CT back in June 2024, I am concerned for underlying malignancy.  Potentially also related to her history of polycythemia vera.  - Oncology consulted; appreciate their recommendations - Blood cultures ordered - Procalcitonin pending - Hold off on antimicrobial therapy at this time  ESRD (end stage renal disease) (HCC) Abbreviated dialysis session today due to hypoxia.  - Nephrology consulted; appreciate their recommendations  Polycythemia vera (HCC) Platelets stable at this time.  - Continue home hydroxyurea  CAD (coronary artery disease), native coronary artery Troponin is minimally elevated in the setting of ESRD.  No chest pain reported.  EKG at baseline.  - Continue home regimen  Acquired hypothyroidism - Continue home Synthroid  Diabetes mellitus, type II (HCC) Diet controlled at this time.  - No indication for SSI  Hypertension - Continue home regimen  Advance Care Planning:   Code Status: Full Code patient  states that given that her husband would want her to receive full resuscitation in the event of cardiac or pulmonary arrest, she would like to remain as a full code for now.  She would not want to be on long-term life support though.  Has been at bedside for goals of care discussion.  Consults: Nephrology  Family Communication: Patient's husband updated at bedside  Severity of Illness: The appropriate patient status for this patient is OBSERVATION. Observation status is judged to be reasonable and necessary in order to provide the required intensity of service to ensure the patient's safety. The patient's presenting symptoms, physical exam findings, and initial radiographic and laboratory data in the context of their medical condition is felt to place them at decreased risk for further clinical deterioration. Furthermore, it is anticipated that the patient will be medically stable for discharge from the hospital within 2 midnights of admission.   Author: Verdene Lennert, MD 08/24/2023 4:36 PM  For on call review [email or web address].

## 2023-08-24 NOTE — ED Notes (Signed)
IV team at bedside 

## 2023-08-24 NOTE — Assessment & Plan Note (Signed)
Diet controlled at this time.  No indication for SSI.

## 2023-08-24 NOTE — Assessment & Plan Note (Addendum)
Per chart review, patient has a history of elevated WBC dating back till at least 2020.  Previous highest was 20.6 in September 2024, currently 35.2.  Patient has had a recent epidural steroid injection, however I would not expect this to cause elevation more than a week out.  Given also markedly elevated ferritin and hilar adenopathy seen on CT back in June 2024, I am concerned for underlying malignancy.  Potentially also related to her history of polycythemia vera.  - Oncology consulted; appreciate their recommendations - Blood cultures ordered - Procalcitonin pending - Hold off on antimicrobial therapy at this time

## 2023-08-24 NOTE — ED Notes (Signed)
This NT and NT Melissa moved pt up in the bed per ED secretary and RT request.

## 2023-08-24 NOTE — Progress Notes (Signed)
[facility], [place] 08/24/23  Subjective:   LOS: 0  Patient known to our practice From outpatient dialysis.  She dialyzes at [facility] T[facility] first shift.  She was sent over from dialysis today for shortness of breath and hypoxia.  She received about 45 minutes of treatment.  Postweight of 61.2 kg.  Target weight of 58.5 kg.  No fluid was removed.  This evening oxygen saturations have improved with oxygen supplementation by nasal cannula.  Patient does not have any peripheral edema but she does feel fluid accumulated in her midsection.  Objective:  Vital signs in last 24 hours:  Temp:  [97.9 F (36.6 C)] 97.9 F (36.6 C) (11/29 1318) Pulse Rate:  [76-90] 82 (11/29 1700) Resp:  [17-27] 18 (11/29 1700) BP: (113-132)/(55-108) 117/57 (11/29 1700) SpO2:  [91 %-93 %] 91 % (11/29 1700) Weight:  [61.2 kg] 61.2 kg (11/29 1318)  Weight change:  Filed Weights   08/24/23 1318  Weight: 61.2 kg    Intake/Output:   No intake or output data in the 24 hours ending 08/24/23 1734   Physical Exam: General: Laying in the bed, no acute distress  HEENT Moist oral mucous membranes  Pulm/lungs [place] O2, normal breathing effort, decreased breath sounds at bases  CVS/Heart Regular, no rub  Abdomen:  Soft, nontender, nondistended  Extremities: Trace edema  Neurologic: Alert, oriented  Skin: No acute rashes  Access: Right IJ PermCath, left upper arm AV fistula developing       Basic Metabolic Panel:  Recent Labs  Lab 08/24/23 1332  NA 134*  K 4.9  CL 98  CO2 20*  GLUCOSE 198*  BUN 54*  CREATININE 7.67*  CALCIUM 7.9*     CBC: Recent Labs  Lab 08/24/23 1321 08/24/23 1332  WBC 31.7* 32.5*  NEUTROABS 26.8*  --   HGB 12.2 12.1  HCT 37.3 36.7  MCV 101.4* 98.9  PLT 409* 409*      Lab Results  Component Value Date   HEPBSAG NON REACTIVE 03/05/2023      Microbiology:  No results found for this or any previous visit (from the past 240  hour(s)).  Coagulation Studies: No results for input(s): "LABPROT", "INR" in the last 72 hours.  Urinalysis: No results for input(s): "COLORURINE", "LABSPEC", "PHURINE", "GLUCOSEU", "HGBUR", "BILIRUBINUR", "KETONESUR", "PROTEINUR", "UROBILINOGEN", "NITRITE", "LEUKOCYTESUR" in the last 72 hours.  Invalid input(s): "APPERANCEUR"    Imaging: DG Chest 2 View  Result Date: 08/24/2023 CLINICAL DATA:  Shortness of breath EXAM: CHEST - 2 VIEW COMPARISON:  Chest radiograph dated 03/05/2023 FINDINGS: Right internal jugular venous catheter tip projects over the superior cavoatrial junction. Normal lung volumes. Mild bilateral interstitial and bibasilar patchy opacities. Blunting of the bilateral costophrenic angles. No pneumothorax. Similar mildly enlarged cardiomediastinal silhouette. No acute osseous abnormality. IMPRESSION: 1. Mild bilateral interstitial and bibasilar patchy opacities, may reflect a combination of pulmonary edema and atelectasis. 2. Small bilateral pleural effusions. Electronically Signed   By: Agustin Cree M.D.   On: 08/24/2023 13:50     Medications:     [START ON 08/25/2023] Chlorhexidine Gluconate Cloth  6 each Topical Q0600   furosemide  80 mg Intravenous Once   heparin  5,000 Units Subcutaneous Q8H   sodium chloride flush  3 mL Intravenous Q12H   acetaminophen **OR** acetaminophen, ondansetron **OR** ondansetron (ZOFRAN) IV  Assessment/ Plan:  89 y.o. female with diabetes, hypertension, hypothyroidism, coronary artery disease, diastolic CHF, thrombocytosis and erythrocytosis, end-stage renal disease was admitted on 08/24/2023  for  Principal Problem:   Acute hypoxic respiratory failure (HCC) Active Problems:   Hypertension   Diabetes mellitus, type II (HCC)   Acquired hypothyroidism   CAD (coronary artery disease), native coronary artery   Polycythemia vera (HCC)   Leukocytosis   ESRD (end stage renal disease) (HCC)  Acute hypoxic respiratory failure (HCC)  [J96.01]  #.  Shortness of breath, acute hypoxic respiratory failure in the setting of ESRD -Likely secondary to volume overload. -Oxygen saturations have improved with oxygen supplementation by nasal cannula. -Chest x-ray shows Mild bilateral interstitial and bibasilar patchy opacities may be combination of pulmonary edema and atelectasis.  Small bilateral pleural effusions.  Blood cultures are pending.  WBC count elevated at 32.5, predominantly neutrophils.  Potassium level is normal at 4.9.  BNP 430.  Troponin of 32. 2D echo from March 06, 2023 shows LVEF of 60 to 65%, grade 1 diastolic dysfunction, normal right ventricular function.  Mild to moderate mitral regurgitation, mild to moderate aortic regurgitation.  No aortic stenosis.  -Plan for urgent hemodialysis either today or tomorrow morning based on staff availability.  Ultrafiltration goal of about 2 L.  Continue oxygen supplementation.  EKG without any signs of ischemia.  #. Anemia of CKD, history of thrombocytosis, macrocytosis, JAK2 gene mutation  Lab Results  Component Value Date   HGB 12.1 08/24/2023   Low dose EPO with HD if hemoglobin trends lower than 10  #. Secondary hyperparathyroidism of renal origin N 25.81   No results found for: "PTH" Lab Results  Component Value Date   PHOS 6.1 (H) 03/13/2023   Monitor calcium and phos level during this admission    LOS: 0 Alyson Ki 11/29/20245:34 PM  [facility] [place], [place] [phone]

## 2023-08-24 NOTE — ED Triage Notes (Signed)
Pt here from [facility] dialysis with SOB. Pt oxygen was low during her tx, tx was then stopped, unsure of how much was dialyzed off of pt. Pt denies cp.   163/79 96 93% RA

## 2023-08-24 NOTE — ED Provider Notes (Signed)
[facility] Provider Note    Event Date/Time   First MD Initiated Contact with Patient 08/24/23 1500     (approximate)   History   Shortness of Breath   HPI  Cassandra [name] is a 89 y.o. female who comes in from [facility] for shortness of breath.  Patient started to have dialysis but had to be stopped due to low oxygen levels while.  She denied any chest pain but she did report shortness of breath.  Patient denies having any oxygen at home.  She denies this ever happening previously.  She reports that she was getting dialysis when they noted her oxygen levels were low so they sent her to the ER.  They said that she probably has a little bit of fluid on her.  She denies any fevers, coughing, swelling in her legs, any other concerns.     Physical Exam   Triage Vital Signs: ED Triage Vitals  Encounter Vitals Group     BP 08/24/23 1319 113/88     Systolic BP Percentile --      Diastolic BP Percentile --      Pulse Rate 08/24/23 1318 90     Resp 08/24/23 1318 17     Temp 08/24/23 1318 97.9 F (36.6 C)     Temp src --      SpO2 08/24/23 1318 93 %     Weight 08/24/23 1318 134 lb 14.7 oz (61.2 kg)     Height 08/24/23 1318 5' (1.524 m)     Head Circumference --      Peak Flow --      Pain Score 08/24/23 1318 0     Pain Loc --      Pain Education --      Exclude from Growth Chart --     Most recent vital signs: Vitals:   08/24/23 1445 08/24/23 1447  BP: (!) 125/108   Pulse:    Resp: (!) 27 19  Temp:    SpO2:       General: Awake, no distress.  CV:  Good peripheral perfusion.  Resp:  Normal effort.  Abd:  No distention.  Other:  No swelling noted in the legs, no calf tenderness   ED Results / Procedures / Treatments   Labs (all labs ordered are listed, but only abnormal results are displayed) Labs Reviewed  CBC - Abnormal; Notable for the following components:      Result Value   WBC 32.5 (*)    RBC 3.71 (*)    RDW 15.6 (*)     Platelets 409 (*)    All other components within normal limits  COMPREHENSIVE METABOLIC PANEL - Abnormal; Notable for the following components:   Sodium 134 (*)    CO2 20 (*)    Glucose, Bld 198 (*)    BUN 54 (*)    Creatinine, Ser 7.67 (*)    Calcium 7.9 (*)    Albumin 3.2 (*)    GFR, Estimated 5 (*)    Anion gap 16 (*)    All other components within normal limits  BRAIN NATRIURETIC PEPTIDE - Abnormal; Notable for the following components:   B Natriuretic Peptide 429.9 (*)    All other components within normal limits     EKG  My interpretation of EKG:  Normal sinus rhythm 93 without any ST elevation, T wave version in lead III, normal intervals  RADIOLOGY I have reviewed the xray personally and interpreted and  patient has some small bilateral pleural effusions as well as bilateral patchy opacities.  PROCEDURES:  Critical Care performed: Yes, see critical care procedure note(s)  .Critical Care  Performed by: Concha Se, MD Authorized by: Concha Se, MD   Critical care provider statement:    Critical care time (minutes):  30   Critical care was necessary to treat or prevent imminent or life-threatening deterioration of the following conditions:  Respiratory failure   Critical care was time spent personally by me on the following activities:  Development of treatment plan with patient or surrogate, discussions with consultants, evaluation of patient's response to treatment, examination of patient, ordering and review of laboratory studies, ordering and review of radiographic studies, ordering and performing treatments and interventions, pulse oximetry, re-evaluation of patient's condition and review of old charts .1-3 Lead EKG Interpretation  Performed by: Concha Se, MD Authorized by: Concha Se, MD     Interpretation: normal     ECG rate:  80   ECG rate assessment: normal     Rhythm: sinus rhythm     Ectopy: none     Conduction: normal      MEDICATIONS  ORDERED IN ED: Medications - No data to display   IMPRESSION / MDM / ASSESSMENT AND PLAN / ED COURSE  I reviewed the triage vital signs and the nursing notes.   Patient's presentation is most consistent with acute presentation with potential threat to life or bodily function.  Patient comes in with concerns for shortness of breath.  Patient was noted to be 86% on 2 L when I went to the room as I turned her up to 3 L.  Suspect related to fluid overload.  No swelling in legs or calf tenderness to suggest DVT.  Her white count was elevated but she does report having steroid injections.  I reviewed the notes and she did undergo a procedure visit on 08/15/2023 with steroid injections.  She chronically runs very elevated and at this time denied any symptoms.  Will get blood cultures, lactate to further evaluate.  Will add on procalcitonin  BNP is elevated but down from before.  White count is significantly elevated at 32,000 and.  CMP shows normal potassium of 4.9  Will discuss the hospital team for admission.  I did discuss antibiotics and we are going to hold off at this time given she is completely asymptomatic without fevers and I suspect again this is more likely related to the steroid injections.  I did discuss the case with Dr. Thedore Mins for nephrology  The patient is on the cardiac monitor to evaluate for evidence of arrhythmia and/or significant heart rate changes.      FINAL CLINICAL IMPRESSION(S) / ED DIAGNOSES   Final diagnoses:  Acute respiratory failure with hypoxia (HCC)  ESRD (end stage renal disease) on dialysis [facility])     Rx / DC Orders   ED Discharge Orders     None        Note:  This document was prepared using Dragon voice recognition software and may include unintentional dictation errors.   Concha Se, MD 08/24/23 [phone]

## 2023-08-25 ENCOUNTER — Encounter: Payer: Self-pay | Admitting: Internal Medicine

## 2023-08-25 DIAGNOSIS — J9601 Acute respiratory failure with hypoxia: Secondary | ICD-10-CM | POA: Diagnosis not present

## 2023-08-25 DIAGNOSIS — D45 Polycythemia vera: Secondary | ICD-10-CM | POA: Diagnosis not present

## 2023-08-25 DIAGNOSIS — N186 End stage renal disease: Secondary | ICD-10-CM | POA: Diagnosis not present

## 2023-08-25 DIAGNOSIS — D72829 Elevated white blood cell count, unspecified: Secondary | ICD-10-CM | POA: Diagnosis not present

## 2023-08-25 LAB — BASIC METABOLIC PANEL
Anion gap: 13 (ref 5–15)
BUN: 62 mg/dL — ABNORMAL HIGH (ref 8–23)
CO2: 22 mmol/L (ref 22–32)
Calcium: 7.8 mg/dL — ABNORMAL LOW (ref 8.9–10.3)
Chloride: 100 mmol/L (ref 98–111)
Creatinine, Ser: 8.64 mg/dL — ABNORMAL HIGH (ref 0.44–1.00)
GFR, Estimated: 4 mL/min — ABNORMAL LOW (ref >60)
Glucose, Bld: 86 mg/dL (ref 70–99)
Potassium: 5.3 mmol/L — ABNORMAL HIGH (ref 3.5–5.1)
Sodium: 135 mmol/L (ref 135–145)

## 2023-08-25 LAB — HEPATITIS B SURFACE ANTIGEN: Hepatitis B Surface Ag: NONREACTIVE

## 2023-08-25 LAB — MRSA NEXT GEN BY PCR, NASAL: MRSA by PCR Next Gen: NOT DETECTED

## 2023-08-25 LAB — CBC
HCT: 31 % — ABNORMAL LOW (ref 36.0–46.0)
Hemoglobin: 10.5 g/dL — ABNORMAL LOW (ref 12.0–15.0)
MCH: 33.5 pg (ref 26.0–34.0)
MCHC: 33.9 g/dL (ref 30.0–36.0)
MCV: 99 fL (ref 80.0–100.0)
Platelets: 364 10*3/uL (ref 150–400)
RBC: 3.13 MIL/uL — ABNORMAL LOW (ref 3.87–5.11)
RDW: 15.5 % (ref 11.5–15.5)
WBC: 19.7 10*3/uL — ABNORMAL HIGH (ref 4.0–10.5)
nRBC: 0 % (ref 0.0–0.2)

## 2023-08-25 MED ORDER — ANTICOAGULANT SODIUM CITRATE 4% (200MG/5ML) IV SOLN: 5.0000 mL | Status: DC | PRN

## 2023-08-25 MED ORDER — PENTAFLUOROPROP-TETRAFLUOROETH EX AERO: 1.0000 | INHALATION_SPRAY | CUTANEOUS | Status: DC | PRN

## 2023-08-25 MED ORDER — HEPARIN SODIUM (PORCINE) 1000 UNIT/ML DIALYSIS: 20.0000 [IU]/kg | INTRAMUSCULAR | Status: DC | PRN

## 2023-08-25 MED ORDER — ALTEPLASE 2 MG IJ SOLR: 2.0000 mg | Freq: Once | INTRAMUSCULAR | Status: DC | PRN

## 2023-08-25 MED ORDER — LIDOCAINE HCL (PF) 1 % IJ SOLN: 5.0000 mL | INTRAMUSCULAR | Status: DC | PRN

## 2023-08-25 MED ORDER — NEPRO/CARBSTEADY PO LIQD: 237.0000 mL | ORAL | Status: DC | PRN

## 2023-08-25 MED ORDER — HEPARIN SODIUM (PORCINE) 1000 UNIT/ML DIALYSIS: 1000.0000 [IU] | INTRAMUSCULAR | Status: DC | PRN

## 2023-08-25 MED ORDER — LIDOCAINE-PRILOCAINE 2.5-2.5 % EX CREA: 1.0000 | TOPICAL_CREAM | CUTANEOUS | Status: DC | PRN

## 2023-08-25 NOTE — ED Notes (Signed)
Patient called out needing to use the restroom. This tech assisted patient in getting up to the bedside commode as well as getting undressed and into the hospital gown as patient stated this was okay. Pt voided along with a small bowel movement, the liner was emptied and correctly disposed of. Pt was provided with two warm blankets as requested. Call bell in reach, personal belongings at bedside, and side rails up for patient safety.

## 2023-08-25 NOTE — Progress Notes (Signed)
PROGRESS NOTE    Cassandra [name]  EAV:[id number] DOB: 24-May-1927 DOA: 08/24/2023 PCP: Danella Penton, MD   Assessment & Plan:   Principal Problem:   Acute hypoxic respiratory failure (HCC) Active Problems:   Leukocytosis   ESRD (end stage renal disease) (HCC)   Hypertension   Diabetes mellitus, type II (HCC)   Acquired hypothyroidism   CAD (coronary artery disease), native coronary artery   Polycythemia vera (HCC)  Assessment and Plan: Acute hypoxic respiratory failure: likely secondary to not getting HD ( center was closed for the holidays). Continue on supplemental oxygen and wean as tolerated. Found to be 88% on RA.    Leukocytosis: chronic. Etiology unclear, ddx secondary to hx of polycythemia vera vs malignancy vs infection. Onco consulted    ESRD: on HD. Nephro following and recs apprec   Hyperkalemia: will be managed w/ HD    Polycythemia vera: continue on home dose of hydroxyurea    Hx of CAD: no CP currently    Hypothyroidism: continue on home dose of synthroid    DM2: well controlled. Diet controlled   Peripheral neuropathy: continue home dose of gabapentin   HTN: continue on home dose of amlodipine       DVT prophylaxis: heparin  Code Status: full  Family Communication: discussed pt's care w/ pt's husband, Chrissie Noa, and answered his questions  Disposition Plan: likely d/c back home   Level of care: Telemetry Medical  Status is: Observation The patient remains OBS appropriate and will d/c before 2 midnights.    Consultants:  Nephro   Procedures:   Antimicrobials:   Subjective: Pt c/o malaise  Objective: Vitals:   08/25/23 0200 08/25/23 0314 08/25/23 0538 08/25/23 0808  BP: (!) 108/51  (!) 125/54 (!) 132/56  Pulse: 76  80 80  Resp: (!) 32  (!) 22 16  Temp:  98.4 F (36.9 C) 98.6 F (37 C) 98.3 F (36.8 C)  TempSrc:  Oral Oral Oral  SpO2: 95%  94% 97%  Weight:      Height:       No intake or output data in the 24 hours ending  08/25/23 0818 Filed Weights   08/24/23 1318  Weight: 61.2 kg    Examination:  General exam: Appears calm and comfortable  Respiratory system: decreased breath sounds b/l Cardiovascular system: S1 & S2+. No rubs, gallops or clicks.  Gastrointestinal system: Abdomen is nondistended, soft and nontender. Normal bowel sounds heard. Central nervous system: Alert and oriented. Moves all extremities Psychiatry: Judgement and insight appear normal. Flat mood and affect     Data Reviewed: I have personally reviewed following labs and imaging studies  CBC: Recent Labs  Lab 08/24/23 1321 08/24/23 1332 08/25/23 0549  WBC 31.7* 32.5* 19.7*  NEUTROABS 26.8*  --   --   HGB 12.2 12.1 10.5*  HCT 37.3 36.7 31.0*  MCV 101.4* 98.9 99.0  PLT 409* 409* 364   Basic Metabolic Panel: Recent Labs  Lab 08/24/23 1332 08/25/23 0549  NA 134* 135  K 4.9 5.3*  CL 98 100  CO2 20* 22  GLUCOSE 198* 86  BUN 54* 62*  CREATININE 7.67* 8.64*  CALCIUM 7.9* 7.8*   GFR: Estimated Creatinine Clearance: 3.3 mL/min (A) (by C-G formula based on SCr of 8.64 mg/dL (H)). Liver Function Tests: Recent Labs  Lab 08/24/23 1332  AST 29  ALT 17  ALKPHOS 98  BILITOT 0.9  PROT 8.0  ALBUMIN 3.2*   No results for input(s): "LIPASE", "AMYLASE" in  the last 168 hours. No results for input(s): "AMMONIA" in the last 168 hours. Coagulation Profile: No results for input(s): "INR", "PROTIME" in the last 168 hours. Cardiac Enzymes: No results for input(s): "CKTOTAL", "CKMB", "CKMBINDEX", "TROPONINI" in the last 168 hours. BNP (last 3 results) No results for input(s): "PROBNP" in the last 8760 hours. HbA1C: No results for input(s): "HGBA1C" in the last 72 hours. CBG: No results for input(s): "GLUCAP" in the last 168 hours. Lipid Profile: No results for input(s): "CHOL", "HDL", "LDLCALC", "TRIG", "CHOLHDL", "LDLDIRECT" in the last 72 hours. Thyroid Function Tests: No results for input(s): "TSH", "T4TOTAL",  "FREET4", "T3FREE", "THYROIDAB" in the last 72 hours. Anemia Panel: Recent Labs    08/22/23 1227  FERRITIN 505*  TIBC 238*  IRON 31   Sepsis Labs: Recent Labs  Lab 08/24/23 1332  PROCALCITON 1.95    Recent Results (from the past 240 hour(s))  Blood culture (routine x 2)     Status: None (Preliminary result)   Collection Time: 08/24/23  5:21 PM   Specimen: BLOOD  Result Value Ref Range Status   Specimen Description BLOOD BLOOD RIGHT FOREARM  Final   Special Requests   Final    BOTTLES DRAWN AEROBIC AND ANAEROBIC Blood Culture adequate volume   Culture   Final    NO GROWTH < 12 HOURS Performed at [facility], [address] [place], [place] [address]    Report Status PENDING  Incomplete  Blood culture (routine x 2)     Status: None (Preliminary result)   Collection Time: 08/24/23  8:36 PM   Specimen: BLOOD RIGHT ARM  Result Value Ref Range Status   Specimen Description BLOOD RIGHT ARM  Final   Special Requests   Final    BOTTLES DRAWN AEROBIC AND ANAEROBIC Blood Culture results may not be optimal due to an excessive volume of blood received in culture bottles   Culture   Final    NO GROWTH < 12 HOURS Performed at [facility], [address] [place], [place] [address]    Report Status PENDING  Incomplete         Radiology Studies: DG Chest 2 View  Result Date: 08/24/2023 CLINICAL DATA:  Shortness of breath EXAM: CHEST - 2 VIEW COMPARISON:  Chest radiograph dated 03/05/2023 FINDINGS: Right internal jugular venous catheter tip projects over the superior cavoatrial junction. Normal lung volumes. Mild bilateral interstitial and bibasilar patchy opacities. Blunting of the bilateral costophrenic angles. No pneumothorax. Similar mildly enlarged cardiomediastinal silhouette. No acute osseous abnormality. IMPRESSION: 1. Mild bilateral interstitial and bibasilar patchy opacities, may reflect a combination of pulmonary edema and atelectasis. 2. Small  bilateral pleural effusions. Electronically Signed   By: Agustin Cree M.D.   On: 08/24/2023 13:50        Scheduled Meds:  amLODipine  10 mg Oral Daily   aspirin EC  81 mg Oral Daily   Chlorhexidine Gluconate Cloth  6 each Topical Q0600   gabapentin  100 mg Oral BID   gabapentin  200 mg Oral QHS   heparin  5,000 Units Subcutaneous Q8H   hydroxyurea  300 mg Oral QAC breakfast   latanoprost  1 drop Both Eyes QHS   levothyroxine  88 mcg Oral Q0600   rOPINIRole  0.5 mg Oral BID   rosuvastatin  20 mg Oral Daily   sodium chloride flush  3 mL Intravenous Q12H   timolol  1 drop Both Eyes q morning   Continuous Infusions:  anticoagulant sodium  citrate       LOS: 0 days       Charise Killian, MD [facility] Pager [phone]  If 7PM-7AM, please contact night-coverage [email or web address]  08/25/2023, 8:18 AM

## 2023-08-25 NOTE — Consult Note (Signed)
Hematology/Oncology Consult note [phone] [phone]      Patient Care Team: Danella Penton, MD as PCP - General (Internal Medicine)   Name of the patient: Cassandra [name]  [id number]  [birth year]   REASON FOR COSULTATION:  Leukocytosis History of presenting illness-  89 y.o. female with PMH listed at below who is currently admitted due to shortness of breath and hypoxia episode during her dialysis.  She felt more tired than usual.  Denies any fever, chills, nausea vomiting diarrhea abdominal pain.  It was noted that she has leukocytosis with a white count of 32.5, significantly higher than her baseline. Patient has history of JAK2 mutation positive myeloproliferative neoplasm/polycythemia vera.  She has been on hydroxyurea.  She will also follow-up visits with me and was last seen by me in July 2021. Patient reports that she continues on hydroxyurea 300 mg daily, not managed by me. 08/24/2023 chest x-ray showed 1. Mild bilateral interstitial and bibasilar patchy opacities, may reflect a combination of pulmonary edema and atelectasis. 2. Small bilateral pleural effusions.  Patient is currently in hemodialysis session.  She reports feeling well and shortness of breath has improved.   Allergies  Allergen Reactions   Atorvastatin Other (See Comments)    MYALGIA   Cyclobenzaprine Other (See Comments)    hallucination   Hydrocodone-Acetaminophen     Other reaction(s): Hallucination when taking whole pill, pt tolerates taking 1/2 pill   Mirtazapine     Other reaction(s): Hallucination   Oxycodone-Acetaminophen Other (See Comments)    hallucination   Paroxetine Hcl Other (See Comments)    hallucination   Penicillins Swelling    Lip and orbital swelling Tolerated 3rd generation cephalosporin (CEFTRIAXONE) between 03/05/2023 and 03/09/2023 with no documented ADRs.   Propoxyphene Other (See Comments)    hallucination   Trazodone Other (See Comments)     hallucination    Patient Active Problem List   Diagnosis Date Noted   Complication of AV dialysis fistula, initial encounter 08/06/2023   Renal failure 03/06/2023   Acute hypoxic respiratory failure (HCC) 03/05/2023   Pneumonia 03/05/2023   ESRD (end stage renal disease) (HCC) 03/05/2023   Hyperkalemia 03/05/2023   Overweight (BMI 25.0-29.9) 12/14/2021   Acute renal failure superimposed on stage 4 chronic kidney disease (HCC) 12/14/2021   Hematochezia, recurrent 12/11/2021   Recent cerebrovascular accident 10/12/2021 (CVA) 12/11/2021   Hypotension 12/11/2021   GI bleed 12/11/2021   Acute blood loss anemia 12/11/2021   Cholelithiasis 12/11/2021   Acute CVA (cerebrovascular accident) (HCC) 10/13/2021   Leukocytosis 10/13/2021   Rectal bleeding 10/03/2021   Bright red rectal bleeding 10/02/2021   GIB (gastrointestinal bleeding) 10/02/2021   Hypoxemia 05/15/2021   Hypoxia 05/14/2021   Hypertensive urgency 05/14/2021   Tibial plateau fracture 04/21/21, left, closed, with routine healing, subsequent encounter 05/14/2021   CKD (chronic kidney disease) stage 4, GFR 15-29 ml/min (HCC) 04/07/2021   Acquired hypothyroidism 12/02/2020   Hyperlipidemia, mixed 12/02/2020   Aortic atherosclerosis (HCC) 10/04/2020   Polycythemia vera (HCC) 06/02/2020   Thrombocytosis 02/18/2020   Vitamin D deficiency 03/25/2018   Hypertension 01/24/2015   Diabetes mellitus, type II (HCC) 01/24/2015   Restless legs syndrome 01/24/2015   Chronic constipation 01/24/2015   Lumbar stenosis with neurogenic claudication 05/04/2014   Peripheral neuropathy 03/09/2014   CAD (coronary artery disease), native coronary artery 01/07/2014     Past Medical History:  Diagnosis Date   Anxiety    Aortic atherosclerosis (HCC)    Arthritis  osteoarthritis in hands, knees ( R torn cartilage, L meniscus removed)    Atherosclerosis of native coronary artery of native heart with unstable angina pectoris (HCC)    CAD  (coronary artery disease) 1999   a.) s/p PCI (details unkown) in [place] in 1999   Campylobacter antigen positive 11/29/2014   a.) presented to [facility] ED with N/V/D; stool culture (+)   ESRD on hemodialysis (HCC)    a.) T-Th-Sat   Glaucoma    Hypercholesterolemia    Hyperlipidemia    Hypertension    Hypothyroidism    Lumbar stenosis with neurogenic claudication    Perforation of colon as colonoscopy complication (HCC) 2010   a.) required short term colostomy with subsequent take down/reversal   RLS (restless legs syndrome)    Thrombocytosis    Type 2 diabetes mellitus with chronic kidney disease (HCC)      Past Surgical History:  Procedure Laterality Date   ABDOMINAL HYSTERECTOMY     left ovary removed   AV FISTULA PLACEMENT Left 05/25/2023   Procedure: ARTERIOVENOUS (AV) FISTULA CREATION (BRACHIALCEPHALIC);  Surgeon: Renford Dills, MD;  Location: [facility] ORS;  Service: Vascular;  Laterality: Left;   BLEPHAROPLASTY Bilateral    CATARACT EXTRACTION W/ INTRAOCULAR LENS  IMPLANT, BILATERAL     COLONOSCOPY  2010   COLOSTOMY  03/2009   perforated colon from colonoscopy   COLOSTOMY REVERSAL  08/2009   CORONARY ANGIOPLASTY WITH STENT PLACEMENT  1999   DIALYSIS/PERMA CATHETER INSERTION N/A 03/08/2023   Procedure: DIALYSIS/PERMA CATHETER INSERTION;  Surgeon: Annice Needy, MD;  Location: [facility] INVASIVE CV LAB;  Service: Cardiovascular;  Laterality: N/A;   FOOT SURGERY Bilateral    toes correction   KNEE ARTHROSCOPY Right    torn cartilage   KNEE ARTHROSCOPY W/ MENISCECTOMY Left 01/2012   TEMPORARY DIALYSIS CATHETER N/A 03/05/2023   Procedure: TEMPORARY DIALYSIS CATHETER;  Surgeon: Annice Needy, MD;  Location: [facility] INVASIVE CV LAB;  Service: Cardiovascular;  Laterality: N/A;    Social History   Socioeconomic History   Marital status: Married    Spouse name: Chrissie Noa   Number of children: 3   Years of education: Not on file   Highest education level: Not on file  Occupational  History   Not on file  Tobacco Use   Smoking status: Former    Current packs/day: 0.00    Types: Cigarettes    Quit date: 01/21/1980    Years since quitting: 43.6   Smokeless tobacco: Never  Vaping Use   Vaping status: Never Used  Substance and Sexual Activity   Alcohol use: Yes    Alcohol/week: 0.0 standard drinks of alcohol    Comment: occasionally   Drug use: No   Sexual activity: Never  Other Topics Concern   Not on file  Social History Narrative   Not on file   Social Determinants of Health   Financial Resource Strain: Low Risk  (06/20/2023)   Received from [facility]   Overall Financial Resource Strain (CARDIA)    Difficulty of Paying Living Expenses: Not hard at all  Food Insecurity: No Food Insecurity (06/20/2023)   Received from [facility]   Hunger Vital Sign    Worried About Running Out of Food in the Last Year: Never true    Ran Out of Food in the Last Year: Never true  Transportation Needs: No Transportation Needs (06/20/2023)   Received from [facility]   [facility] - Transportation  In the past 12 months, has lack of transportation kept you from medical appointments or from getting medications?: No    Lack of Transportation (Non-Medical): No  Physical Activity: Not on file  Stress: Not on file  Social Connections: Not on file  Intimate Partner Violence: Not At Risk (03/06/2023)   Humiliation, Afraid, Rape, and Kick questionnaire    Fear of Current or Ex-Partner: No    Emotionally Abused: No    Physically Abused: No    Sexually Abused: No     Family History  Problem Relation Age of Onset   Hypertension Mother    Hypertension Father    Early death Father    Heart disease Father      Current Facility-Administered Medications:    acetaminophen (TYLENOL) tablet 650 mg, 650 mg, Oral, Q6H PRN **OR** acetaminophen (TYLENOL) suppository 650 mg, 650 mg, Rectal, Q6H PRN, Verdene Lennert, MD   ALPRAZolam  Prudy Feeler) tablet 0.25 mg, 0.25 mg, Oral, BID PRN, Verdene Lennert, MD   alteplase (CATHFLO ACTIVASE) injection 2 mg, 2 mg, Intracatheter, Once PRN, Thedore Mins, Harmeet, MD   amLODipine (NORVASC) tablet 10 mg, 10 mg, Oral, Daily, Verdene Lennert, MD   anticoagulant sodium citrate solution 5 mL, 5 mL, Intracatheter, PRN, Mosetta Pigeon, MD   aspirin EC tablet 81 mg, 81 mg, Oral, Daily, Verdene Lennert, MD, 81 mg at 08/24/23 1843   Chlorhexidine Gluconate Cloth 2 % PADS 6 each, 6 each, Topical, Q0600, Mosetta Pigeon, MD   feeding supplement (NEPRO CARB STEADY) liquid 237 mL, 237 mL, Oral, PRN, Thedore Mins, Harmeet, MD   gabapentin (NEURONTIN) capsule 100 mg, 100 mg, Oral, BID, Foye Deer, RPH   gabapentin (NEURONTIN) capsule 200 mg, 200 mg, Oral, QHS, Foye Deer, RPH   heparin injection 1,000 Units, 1,000 Units, Intracatheter, PRN, Thedore Mins, Harmeet, MD   heparin injection 1,200 Units, 20 Units/kg, Dialysis, PRN, Thedore Mins, Harmeet, MD   heparin injection 5,000 Units, 5,000 Units, Subcutaneous, Q8H, Verdene Lennert, MD   hydroxyurea (DROXIA) capsule 300 mg, 300 mg, Oral, QAC breakfast, Verdene Lennert, MD   latanoprost (XALATAN) 0.005 % ophthalmic solution 1 drop, 1 drop, Both Eyes, QHS, Verdene Lennert, MD   levothyroxine (SYNTHROID) tablet 88 mcg, 88 mcg, Oral, Q0600, Verdene Lennert, MD   lidocaine (PF) (XYLOCAINE) 1 % injection 5 mL, 5 mL, Intradermal, PRN, Thedore Mins, Harmeet, MD   lidocaine-prilocaine (EMLA) cream 1 Application, 1 Application, Topical, PRN, Thedore Mins, Harmeet, MD   ondansetron (ZOFRAN) tablet 4 mg, 4 mg, Oral, Q6H PRN **OR** ondansetron (ZOFRAN) injection 4 mg, 4 mg, Intravenous, Q6H PRN, Verdene Lennert, MD   pentafluoroprop-tetrafluoroeth (GEBAUERS) aerosol 1 Application, 1 Application, Topical, PRN, Thedore Mins, Harmeet, MD   rOPINIRole (REQUIP) tablet 0.5 mg, 0.5 mg, Oral, BID, Verdene Lennert, MD   rosuvastatin (CRESTOR) tablet 20 mg, 20 mg, Oral, Daily, Huel Cote, Iulia, MD   sodium chloride flush  (NS) 0.9 % injection 3 mL, 3 mL, Intravenous, Q12H, Huel Cote, Iulia, MD   timolol (TIMOPTIC) 0.5 % ophthalmic solution 1 drop, 1 drop, Both Eyes, q morning, Verdene Lennert, MD  Review of Systems  Constitutional:  Negative for chills, fatigue and fever.  HENT:   Negative for hearing loss and voice change.   Eyes:  Negative for eye problems.  Respiratory:  Positive for shortness of breath. Negative for chest tightness and cough.   Cardiovascular:  Negative for chest pain.  Gastrointestinal:  Negative for abdominal distention and abdominal pain.  Endocrine: Negative for hot flashes.  Genitourinary:  Negative for difficulty urinating and  frequency.   Musculoskeletal:  Positive for arthralgias.  Skin:  Negative for itching and rash.  Neurological:  Negative for extremity weakness.  Hematological:  Negative for adenopathy.  Psychiatric/Behavioral:  Negative for confusion.     PHYSICAL EXAM Vitals:   08/25/23 0844 08/25/23 0900 08/25/23 0920 08/25/23 0930  BP: 131/60 111/61 (!) 129/56 (!) 102/52  Pulse: 84 81 79 80  Resp: (!) 25 (!) 21 20 (!) 21  Temp:      TempSrc:      SpO2: 93% 96% 94% 92%  Weight: 127 lb 13.9 oz (58 kg)     Height:       Physical Exam    LABORATORY STUDIES    Latest Ref Rng & Units 08/25/2023    5:49 AM 08/24/2023    1:32 PM 08/24/2023    1:21 PM  CBC  WBC 4.0 - 10.5 K/uL 19.7  32.5  31.7   Hemoglobin 12.0 - 15.0 g/dL 04.5  40.9  81.1   Hematocrit 36.0 - 46.0 % 31.0  36.7  37.3   Platelets 150 - 400 K/uL 364  409  409       Latest Ref Rng & Units 08/25/2023    5:49 AM 08/24/2023    1:32 PM 05/25/2023    8:53 AM  CMP  Glucose 70 - 99 mg/dL 86  914  96   BUN 8 - 23 mg/dL 62  54  14   Creatinine 0.44 - 1.00 mg/dL 7.82  9.56  2.13   Sodium 135 - 145 mmol/L 135  134  139   Potassium 3.5 - 5.1 mmol/L 5.3  4.9  3.2   Chloride 98 - 111 mmol/L 100  98  96   CO2 22 - 32 mmol/L 22  20    Calcium 8.9 - 10.3 mg/dL 7.8  7.9    Total Protein 6.5 - 8.1 g/dL   8.0    Total Bilirubin <1.2 mg/dL  0.9    Alkaline Phos 38 - 126 U/L  98    AST 15 - 41 U/L  29    ALT 0 - 44 U/L  17       RADIOGRAPHIC STUDIES: I have personally reviewed the radiological images as listed and agreed with the findings in the report. DG Chest 2 View  Result Date: 08/24/2023 CLINICAL DATA:  Shortness of breath EXAM: CHEST - 2 VIEW COMPARISON:  Chest radiograph dated 03/05/2023 FINDINGS: Right internal jugular venous catheter tip projects over the superior cavoatrial junction. Normal lung volumes. Mild bilateral interstitial and bibasilar patchy opacities. Blunting of the bilateral costophrenic angles. No pneumothorax. Similar mildly enlarged cardiomediastinal silhouette. No acute osseous abnormality. IMPRESSION: 1. Mild bilateral interstitial and bibasilar patchy opacities, may reflect a combination of pulmonary edema and atelectasis. 2. Small bilateral pleural effusions. Electronically Signed   By: Agustin Cree M.D.   On: 08/24/2023 13:50   VAS US DUPLEX DIALYSIS ACCESS (AVF, AVG)  Result Date: 07/12/2023 DIALYSIS ACCESS Patient Name:  CHRISETTE FILION  Date of Exam:   07/02/2023 Medical Rec #: [id number]        Accession #:    [id number] Date of Birth: May 25, 1927       Patient Gender: F Patient Age:   48 years Exam Location:  [facility] Procedure:      VAS US DUPLEX DIALYSIS ACCESS (AVF, AVG) Referring Phys: Levora Dredge --------------------------------------------------------------------------------  Access Site: Left Upper Extremity. Access Type: Brachial-cephalic AVF. History: 05/25/2023: Lt Brachial Cephalic AVF  created. Performing Technologist: Debbe Bales RVS  Examination Guidelines: A complete evaluation includes B-mode imaging, spectral Doppler, color Doppler, and power Doppler as needed of all accessible portions of each vessel. Unilateral testing is considered an integral part of a complete examination. Limited examinations for reoccurring indications may  be performed as noted.  Findings: +--------------------+----------+-----------------+--------+ AVF                 PSV (cm/s)Flow Vol (mL/min)Comments +--------------------+----------+-----------------+--------+ Native artery inflow   309          1326                +--------------------+----------+-----------------+--------+ AVF Anastomosis        385                              +--------------------+----------+-----------------+--------+  +---------------+----------+-------------+----------+--------+ OUTFLOW VEIN   PSV (cm/s)Diameter (cm)Depth (cm)Describe +---------------+----------+-------------+----------+--------+ Subclavian vein    80                                    +---------------+----------+-------------+----------+--------+ Confluence        141                                    +---------------+----------+-------------+----------+--------+ Shoulder           66                                    +---------------+----------+-------------+----------+--------+ Prox UA            76                                    +---------------+----------+-------------+----------+--------+ Mid UA            110                                    +---------------+----------+-------------+----------+--------+ Dist UA           302                                    +---------------+----------+-------------+----------+--------+  +--------------+-------------+---------+---------+---------+-------------------+               Diameter (cm)  Depth  Branching   PSV       Flow Volume                                  (cm)             (cm/s)       (ml/min)       +--------------+-------------+---------+---------+---------+-------------------+ Lt Rad Art                                      100  Dist                                                                       +--------------+-------------+---------+---------+---------+-------------------+  Summary: First Post Imaging of the Left Brachial Cephalic AVF. The Flow Volume appears to be Normal.  *See table(s) above for measurements and observations.  Diagnosing physician: Levora Dredge MD Electronically signed by Levora Dredge MD on 07/12/2023 at 5:55:43 PM.   --------------------------------------------------------------------------------   Final      Assessment and plan-   # ESRD on hemodialysis managed by nephrology. # Shortness of breath is likely secondary to volume overload due to change of dialysis schedule due to holidays # Leukocytosis, acute on chronic.  Due to Jak2 mutation positive myeloproliferative neoplasm/PV Continue hydroxyurea 300 mg daily.  Today, leukocytosis has decreased and close to her baseline.  Outpatient follow-up.  # Anemia is likely due to chronic kidney disease.   Thank you for allowing me to participate in the care of this patient.   Rickard Patience, MD, PhD Hematology Oncology 08/25/2023

## 2023-08-25 NOTE — Progress Notes (Signed)
[facility], [place] 08/25/23  Subjective:   LOS: 0  Patient known to our practice From outpatient dialysis.  She dialyzes at [facility] T[facility] first shift.  She was sent over from dialysis with shortness of breath and hypoxia.  She received about 45 minutes of treatment.  Postweight of 61.2 kg.  Target weight of 58.5 kg.    Patient seen and evaluated during dialysis   HEMODIALYSIS FLOWSHEET:  Blood Flow Rate (mL/min): 350 mL/min Arterial Pressure (mmHg): -270 mmHg Venous Pressure (mmHg): 130 mmHg TMP (mmHg): 13 mmHg Ultrafiltration Rate (mL/min): 1000 mL/min Dialysate Flow Rate (mL/min): 300 ml/min  States she feels well today Remains on 6L [place] Reports improved respiratory status  Objective:  Vital signs in last 24 hours:  Temp:  [97.9 F (36.6 C)-98.8 F (37.1 C)] 98.2 F (36.8 C) (11/30 8295) Pulse Rate:  [69-90] 76 (11/30 1200) Resp:  [16-32] 20 (11/30 1200) BP: (93-134)/(38-108) 121/54 (11/30 1200) SpO2:  [88 %-97 %] 95 % (11/30 1200) Weight:  [58 kg-61.2 kg] 58 kg (11/30 0844)  Weight change:  Filed Weights   08/24/23 1318 08/25/23 0844  Weight: 61.2 kg 58 kg    Intake/Output:   No intake or output data in the 24 hours ending 08/25/23 1226   Physical Exam: General: Laying in the bed, no acute distress  HEENT Moist oral mucous membranes  Pulm/lungs Mutual O2, normal breathing effort, decreased breath sounds at bases  CVS/Heart Regular, no rub  Abdomen:  Soft, nontender, nondistended  Extremities: Trace edema  Neurologic: Alert, oriented  Skin: No acute rashes  Access: Right IJ PermCath, left upper arm AV fistula developing       Basic Metabolic Panel:  Recent Labs  Lab 08/24/23 1332 08/25/23 0549  NA 134* 135  K 4.9 5.3*  CL 98 100  CO2 20* 22  GLUCOSE 198* 86  BUN 54* 62*  CREATININE 7.67* 8.64*  CALCIUM 7.9* 7.8*     CBC: Recent Labs  Lab 08/24/23 1321 08/24/23 1332 08/25/23 0549  WBC 31.7* 32.5*  19.7*  NEUTROABS 26.8*  --   --   HGB 12.2 12.1 10.5*  HCT 37.3 36.7 31.0*  MCV 101.4* 98.9 99.0  PLT 409* 409* 364      Lab Results  Component Value Date   HEPBSAG NON REACTIVE 08/24/2023      Microbiology:  Recent Results (from the past 240 hour(s))  Blood culture (routine x 2)     Status: None (Preliminary result)   Collection Time: 08/24/23  5:21 PM   Specimen: BLOOD  Result Value Ref Range Status   Specimen Description BLOOD BLOOD RIGHT FOREARM  Final   Special Requests   Final    BOTTLES DRAWN AEROBIC AND ANAEROBIC Blood Culture adequate volume   Culture   Final    NO GROWTH < 12 HOURS Performed at [facility], [address] [place], [place] [address]    Report Status PENDING  Incomplete  Blood culture (routine x 2)     Status: None (Preliminary result)   Collection Time: 08/24/23  8:36 PM   Specimen: BLOOD RIGHT ARM  Result Value Ref Range Status   Specimen Description BLOOD RIGHT ARM  Final   Special Requests   Final    BOTTLES DRAWN AEROBIC AND ANAEROBIC Blood Culture results may not be optimal due to an excessive volume of blood received in culture bottles   Culture   Final    NO GROWTH < 12  HOURS Performed at [facility], [address] [place], [place] [address]    Report Status PENDING  Incomplete    Coagulation Studies: No results for input(s): "LABPROT", "INR" in the last 72 hours.  Urinalysis: No results for input(s): "COLORURINE", "LABSPEC", "PHURINE", "GLUCOSEU", "HGBUR", "BILIRUBINUR", "KETONESUR", "PROTEINUR", "UROBILINOGEN", "NITRITE", "LEUKOCYTESUR" in the last 72 hours.  Invalid input(s): "APPERANCEUR"    Imaging: DG Chest 2 View  Result Date: 08/24/2023 CLINICAL DATA:  Shortness of breath EXAM: CHEST - 2 VIEW COMPARISON:  Chest radiograph dated 03/05/2023 FINDINGS: Right internal jugular venous catheter tip projects over the superior cavoatrial junction. Normal lung volumes. Mild bilateral interstitial and  bibasilar patchy opacities. Blunting of the bilateral costophrenic angles. No pneumothorax. Similar mildly enlarged cardiomediastinal silhouette. No acute osseous abnormality. IMPRESSION: 1. Mild bilateral interstitial and bibasilar patchy opacities, may reflect a combination of pulmonary edema and atelectasis. 2. Small bilateral pleural effusions. Electronically Signed   By: Agustin Cree M.D.   On: 08/24/2023 13:50     Medications:    anticoagulant sodium citrate      amLODipine  10 mg Oral Daily   aspirin EC  81 mg Oral Daily   Chlorhexidine Gluconate Cloth  6 each Topical Q0600   gabapentin  100 mg Oral BID   gabapentin  200 mg Oral QHS   heparin  5,000 Units Subcutaneous Q8H   hydroxyurea  300 mg Oral QAC breakfast   latanoprost  1 drop Both Eyes QHS   levothyroxine  88 mcg Oral Q0600   rOPINIRole  0.5 mg Oral BID   rosuvastatin  20 mg Oral Daily   sodium chloride flush  3 mL Intravenous Q12H   timolol  1 drop Both Eyes q morning   acetaminophen **OR** acetaminophen, ALPRAZolam, alteplase, anticoagulant sodium citrate, feeding supplement (NEPRO CARB STEADY), heparin, heparin, lidocaine (PF), lidocaine-prilocaine, ondansetron **OR** ondansetron (ZOFRAN) IV, pentafluoroprop-tetrafluoroeth  Assessment/ Plan:  89 y.o. female with diabetes, hypertension, hypothyroidism, coronary artery disease, diastolic CHF, thrombocytosis and erythrocytosis, end-stage renal disease was admitted on 08/24/2023 for  Principal Problem:   Acute hypoxic respiratory failure (HCC) Active Problems:   Hypertension   Diabetes mellitus, type II (HCC)   Acquired hypothyroidism   CAD (coronary artery disease), native coronary artery   Polycythemia vera (HCC)   Leukocytosis   ESRD (end stage renal disease) on dialysis (HCC)  ESRD (end stage renal disease) on dialysis (HCC) [N18.6, Z99.2] Acute respiratory failure with hypoxia (HCC) [J96.01] Acute hypoxic respiratory failure (HCC) [J96.01]  #.  Shortness of  breath, acute hypoxic respiratory failure in the setting of ESRD -Likely secondary to volume overload. -Oxygen saturations have improved with oxygen supplementation by nasal cannula. -Chest x-ray shows Mild bilateral interstitial and bibasilar patchy opacities may be combination of pulmonary edema and atelectasis.  Small bilateral pleural effusions.  Blood cultures are pending.  WBC count elevated at 32.5, predominantly neutrophils.  Potassium level is normal at 4.9.  BNP 430.  Troponin of 32. 2D echo from March 06, 2023 shows LVEF of 60 to 65%, grade 1 diastolic dysfunction, normal right ventricular function.  Mild to moderate mitral regurgitation, mild to moderate aortic regurgitation.  No aortic stenosis.  - Receiving dialysis today, UF 2L as tolerated.  - Respiratory status has improved since admission - Remains on 6L  - Next treatment scheduled for Tuesday.   #. Anemia of CKD, history of thrombocytosis, macrocytosis, JAK2 gene mutation  Lab Results  Component Value Date   HGB 10.5 (L) 08/25/2023   Low  dose EPO with HD if hemoglobin trends lower than 10. Hgb stable  #. Secondary hyperparathyroidism of renal origin N 25.81   No results found for: "PTH" Lab Results  Component Value Date   PHOS 6.1 (H) 03/13/2023   Calcium levels slightly decreased, 7.8. Will monitor and consider supplementation.   #. Mild hyperkalemia, potassium 5.3. Will dialyze on 2K bath to manage.     LOS: 0 Wendee Beavers 11/30/202412:26 PM  [facility] [address] [place], [place] [phone]

## 2023-08-25 NOTE — Plan of Care (Signed)
  Problem: Education: Goal: Knowledge of General Education information will improve Description: Including pain rating scale, medication(s)/side effects and non-pharmacologic comfort measures Outcome: Progressing   Problem: Clinical Measurements: Goal: Ability to maintain clinical measurements within normal limits will improve Outcome: Progressing Goal: Respiratory complications will improve Outcome: Progressing   

## 2023-08-25 NOTE — Progress Notes (Signed)
Hemodialysis note  Received patient in bed to unit. Alert and oriented.  Informed consent signed and in chart.  Treatment initiated: 0920 Treatment completed: 1300  Patient tolerated well. Transported back to room, alert without acute distress.  Report given to patient's RN.   Access used: Right Chest HD Catheter Access issues: High Venous pressure. Lines were reversed during treatment.  Total UF removed: 2L Medication(s) given:  none  Post HD weight: 56.4 kg   Cassandra [name] [facility]

## 2023-08-26 DIAGNOSIS — J9601 Acute respiratory failure with hypoxia: Secondary | ICD-10-CM | POA: Diagnosis not present

## 2023-08-26 LAB — BASIC METABOLIC PANEL
Anion gap: 13 (ref 5–15)
BUN: 36 mg/dL — ABNORMAL HIGH (ref 8–23)
CO2: 25 mmol/L (ref 22–32)
Calcium: 7.8 mg/dL — ABNORMAL LOW (ref 8.9–10.3)
Chloride: 95 mmol/L — ABNORMAL LOW (ref 98–111)
Creatinine, Ser: 5.7 mg/dL — ABNORMAL HIGH (ref 0.44–1.00)
GFR, Estimated: 6 mL/min — ABNORMAL LOW (ref >60)
Glucose, Bld: 105 mg/dL — ABNORMAL HIGH (ref 70–99)
Potassium: 4.1 mmol/L (ref 3.5–5.1)
Sodium: 133 mmol/L — ABNORMAL LOW (ref 135–145)

## 2023-08-26 LAB — CBC
HCT: 34.2 % — ABNORMAL LOW (ref 36.0–46.0)
Hemoglobin: 11.5 g/dL — ABNORMAL LOW (ref 12.0–15.0)
MCH: 32.7 pg (ref 26.0–34.0)
MCHC: 33.6 g/dL (ref 30.0–36.0)
MCV: 97.2 fL (ref 80.0–100.0)
Platelets: 460 10*3/uL — ABNORMAL HIGH (ref 150–400)
RBC: 3.52 MIL/uL — ABNORMAL LOW (ref 3.87–5.11)
RDW: 15.3 % (ref 11.5–15.5)
WBC: 14.4 10*3/uL — ABNORMAL HIGH (ref 4.0–10.5)
nRBC: 0 % (ref 0.0–0.2)

## 2023-08-26 NOTE — Progress Notes (Signed)
SATURATION QUALIFICATIONS: (This note is used to comply with regulatory documentation for home oxygen)  Patient Saturations on Room Air at Rest = 83%  Patient Saturations on Room Air while Ambulating = 72%  Patient Saturations on 2 Liters of oxygen while Ambulating = 93%  Please briefly explain why patient needs home oxygen:  Pt is unaware of her need for O2. She agrees that she will need oxygen at home but is requesting a portable tank.

## 2023-08-26 NOTE — Evaluation (Addendum)
Physical Therapy Evaluation Patient Details Name: Cassandra [name] MRN: [id number] DOB: 09-26-1926 Today's Date: 08/26/2023  History of Present Illness  Pt is a 89 y/o F admitted on 08/24/23 after presenting with c/o SOB. Pt is being treated for acute hypoxic respiratory failure in the setting of hypervolemia after missed dialysis yesterday (closed for the holidays). PMH: ESRD on HD (TTSat), chronic leukocytosis, DM2, HTN, lumbar stenosis with neurogenic claudication, HTN, HLD, CAD s/p PCI 1999, anxiety, glaucoma  Clinical Impression  Pt seen for PT evaluation with pt agreeable to tx. Pt reports prior to admission she was ambulatory with rollator, denies falls, assists with cleaning/dusting. On this date, pt is able to complete supine>sit with use of hospital bed features, STS with min assist, & ambulate with RW & CGA. Pt with improving overall tolerance on 2nd gait trial. PT encouraged pt to have son stay & assist her at d/c but pt reports "I don't need him". Will continue to follow pt acutely to address strengthening, balance, endurance, & gait with LRAD.  Pt ambulated on room air, SPO2 dropped as low as 83% Ambulated on 2L/min via nasal cannula SpO2 dropped as low as 86% Pt able to increase SpO2 to >/= 90% with rest on 2L/min Educated pt on importance of using supplemental O2 at this time even though pt denies feeling SOB.   Pt left on 2L/min in care of nurse at end of session.      If plan is discharge home, recommend the following: A little help with walking and/or transfers;A little help with bathing/dressing/bathroom;Assistance with cooking/housework;Assist for transportation;Help with stairs or ramp for entrance   Can travel by private vehicle        Equipment Recommendations None recommended by PT (pt has rolator)  Recommendations for Other Services       Functional Status Assessment       Precautions / Restrictions Precautions Precautions: Fall Restrictions Weight Bearing  Restrictions: No      Mobility  Bed Mobility Overal bed mobility: Modified Independent Bed Mobility: Supine to Sit     Supine to sit: Modified independent (Device/Increase time), HOB elevated, Used rails     General bed mobility comments: exit R side of bed    Transfers Overall transfer level: Needs assistance Equipment used: Rolling walker (2 wheels) Transfers: Sit to/from Stand Sit to Stand: Min assist           General transfer comment: STS from EOB, ongoing cuing re: pushing to standing vs pulling up on RW, pt with posterior lean onto EOB with BLE to assist with STS transfer    Ambulation/Gait Ambulation/Gait assistance: Min assist Gait Distance (Feet): 25 Feet (+ 50 ft) Assistive device: Rolling walker (2 wheels) Gait Pattern/deviations: Decreased step length - right, Decreased stride length, Decreased step length - left Gait velocity: decreased     General Gait Details: During initial gait trial pt resting L forearm on RW vs holding it with hand - PT providing cuing for hand placement, improved hand placement on 2nd trial.  Stairs            Wheelchair Mobility     Tilt Bed    Modified Rankin (Stroke Patients Only)       Balance Overall balance assessment: Needs assistance Sitting-balance support: Feet supported Sitting balance-Leahy Scale: Good Sitting balance - Comments: Pt able to don socks sitting EOB without LOB.   Standing balance support: During functional activity, Bilateral upper extremity supported, Reliant on assistive device for balance Standing balance-Leahy  Scale: Poor                               Pertinent Vitals/Pain Pain Assessment Pain Assessment: No/denies pain    Home Living Family/patient expects to be discharged to:: Private residence Living Arrangements: Spouse/significant other Available Help at Discharge: Family;Available 24 hours/day Type of Home: Apartment Home Access: Level entry       Home  Layout: One level Home Equipment: Rollator (4 wheels);Shower seat      Prior Function Prior Level of Function : Needs assist             Mobility Comments: Ambulatory with rollator, denies falls ADLs Comments: reports she still dusts, bathes & dresses herself     Extremity/Trunk Assessment                Communication   Communication Communication: No apparent difficulties  Cognition Arousal: Alert Behavior During Therapy: WFL for tasks assessed/performed Overall Cognitive Status: Within Functional Limits for tasks assessed                                          General Comments      Exercises     Assessment/Plan    PT Assessment    PT Problem List         PT Treatment Interventions      PT Goals (Current goals can be found in the Care Plan section)  Acute Rehab PT Goals Patient Stated Goal: go home PT Goal Formulation: With patient Time For Goal Achievement: 09/09/23 Potential to Achieve Goals: Good    Frequency Min 1X/week     Co-evaluation               AM-PAC PT "6 Clicks" Mobility  Outcome Measure Help needed turning from your back to your side while in a flat bed without using bedrails?: None Help needed moving from lying on your back to sitting on the side of a flat bed without using bedrails?: A Little Help needed moving to and from a bed to a chair (including a wheelchair)?: A Little Help needed standing up from a chair using your arms (e.g., wheelchair or bedside chair)?: A Little Help needed to walk in hospital room?: A Little Help needed climbing 3-5 steps with a railing? : A Little 6 Click Score: 19    End of Session   Activity Tolerance: Patient tolerated treatment well Patient left: in bed;with nursing/sitter in room Nurse Communication: Mobility status PT Visit Diagnosis: Unsteadiness on feet (R26.81);Muscle weakness (generalized) (M62.81);Other abnormalities of gait and mobility (R26.89)    Time:  3664-4034 PT Time Calculation (min) (ACUTE ONLY): 21 min   Charges:   PT Evaluation $PT Eval Low Complexity: 1 Low   PT General Charges $$ ACUTE PT VISIT: 1 Visit         Aleda Grana, PT, DPT 08/26/23, 1:15 PM   Sandi Mariscal 08/26/2023, 1:13 PM

## 2023-08-26 NOTE — TOC Progression Note (Addendum)
Transition of Care [facility]) - Progression Note    Patient Details  Name: Cassandra [name] MRN: [id number] Date of Birth: [birth year]  Transition of Care [facility]) CM/SW Contact  Bing Quarry, RN Phone Number: 08/26/2023, 12:44 PM  Clinical Narrative: 12/1: Patient set up with [facility] via [facility] after speaking with patient and HH orders placed. Start of service will be Monday 08/27/23. Oxygen saturation ambulation test done, DME oxygen orders in and [employer] notified as patient had no preference.  Requested portable condenser to make it easier to get to Dialysis on TTS schedule at [facility].   Portable will be delivered to hospital room.  Patient states she has transportation home today.  Has a RW at home.  Declined any other needs.  Nephrology following in acute care setting as well.    Follow ups in the AVS:  PCP: Bethann Punches Please follow up with in 7-14 days with PCP. 12/2: Duplex Dialysis Access at [facility]. 245 pm.  12/2: Follow up with Levora Dredge 415 pm. [facility].    Gabriel Cirri MSN RN CM  Care Management Department.  [place]  [facility] Direct Dial: [phone] Main Office Phone: [phone] Weekends Only          Barriers to Discharge: No Barriers Identified, Barriers Resolved  Expected Discharge Plan and Services                         DME Arranged: Oxygen DME Agency: [employer] Date DME Agency Contacted: 08/26/23 Time DME Agency Contacted: 1233 Representative spoke with at DME Agency: Selena Batten HH Arranged: PT, OT HH Agency: [facility] Date [facility] Agency Contacted: 08/26/23 Time HH Agency Contacted: 1233 Representative spoke with at [facility] Agency: Elnita Maxwell   Social Determinants of Health (SDOH) Interventions SDOH Screenings   Food Insecurity: No Food Insecurity (08/25/2023)  Housing: Low Risk  (08/25/2023)  Transportation Needs: No Transportation Needs (08/25/2023)  Utilities: Not At Risk (08/25/2023)   Financial Resource Strain: Low Risk  (06/20/2023)   Received from [facility]  Tobacco Use: Medium Risk (08/25/2023)    Readmission Risk Interventions    03/09/2023    4:09 PM  Readmission Risk Prevention Plan  Transportation Screening Complete  PCP or Specialist Appt within 3-5 Days Complete  HRI or Home Care Consult Complete  Social Work Consult for Recovery Care Planning/Counseling Complete  Palliative Care Screening Not Applicable  Medication Review [facility]) Complete

## 2023-08-26 NOTE — Progress Notes (Signed)
Transition of Care [facility]) - Inpatient Brief Assessment   Patient Details  Name: Cassandra [name] MRN: [id number] Date of Birth: 1927/06/13  Transition of Care [facility]) CM/SW Contact:    Bing Quarry, RN Phone Number: 08/26/2023, 1:00 PM   Clinical Narrative: 12/1: Patient admitted via EMS from [facility] HD facility for SOB and hypoxia. Sig PMH for ESRD on HD at [facility] HD center on [address] [place]), chronic leukocytosis, type 2 diabetes, hypertension, lumbar stenosis with neurogenic claudication, hypertension, hyperlipidemia, CAD s/p PCI 1999.  Dx with Acute hypoxic resp.failure per provider notes. No SDOH alerts noted for TOC follow up on this admission. Patient has transportation. TOC to follow for discharge planning/needs.   Gabriel Cirri MSN RN CM  Care Management Department.  [place]  [facility] Direct Dial: [phone] Main Office Phone: [phone] Weekends Only     Transition of Care Asessment: Insurance and Status: Insurance coverage has been reviewed Patient has primary care physician: Yes Home environment has been reviewed: From home with spouse Prior level of function:: Needs assist, Was at [facility] June 2024 Prior/Current Home Services: No current home services Social Determinants of Health Reivew: SDOH reviewed no interventions necessary Readmission risk has been reviewed: Yes Transition of care needs: transition of care needs identified, TOC will continue to follow

## 2023-08-26 NOTE — Progress Notes (Signed)
PROGRESS NOTE    Cassandra [name]  RUE:[id number] DOB: 10-11-1926 DOA: 08/24/2023 PCP: Danella Penton, MD   Assessment & Plan:   Principal Problem:   Acute hypoxic respiratory failure (HCC) Active Problems:   Leukocytosis   ESRD (end stage renal disease) on dialysis (HCC)   Hypertension   Diabetes mellitus, type II (HCC)   Acquired hypothyroidism   CAD (coronary artery disease), native coronary artery   Polycythemia vera (HCC)  Assessment and Plan: Acute hypoxic respiratory failure: likely secondary to not getting HD ( center was closed for the holidays). Found to be 88% on RA. Still dropping into the 80s at rest off of oxygen & into 70s w/ exertion. Continue on supplemental oxygen and wean as tolerated    Leukocytosis: chronic. Likely secondary polycythemia vera as per onco. Onco recs apprec   ESRD: on HD. Nephro following and recs apprec   Hyperkalemia: resolved     Polycythemia vera: continue on home dose of hydroxyurea    Hx of CAD: no CP currently    Hypothyroidism: continue on home dose of levothyroxine    DM2: well controlled. Diet controlled   Peripheral neuropathy: continue on home dose of gabapentin   HTN: continue on home dose of amlodipine       DVT prophylaxis: heparin  Code Status: full  Family Communication: discussed pt's care w/ pt's husband, Chrissie Noa, and answered his questions  Disposition Plan: likely d/c back home   Level of care: Telemetry Medical  Status is: Observation The patient remains OBS appropriate and will d/c before 2 midnights.    Consultants:  Nephro   Procedures:   Antimicrobials:   Subjective: Pt c/o frustration   Objective: Vitals:   08/25/23 1354 08/25/23 1540 08/25/23 2316 08/26/23 0745  BP: (!) 126/57 (!) 120/55 (!) 114/46 (!) 111/55  Pulse: 84 83 77 73  Resp: 20 17 19 17   Temp: 98.2 F (36.8 C) 98.3 F (36.8 C) 98.2 F (36.8 C) 98.3 F (36.8 C)  TempSrc: Oral Oral  Oral  SpO2: 93% 93% 91% 96%   Weight:      Height:       No intake or output data in the 24 hours ending 08/26/23 1445 Filed Weights   08/24/23 1318 08/25/23 0844 08/25/23 1300  Weight: 61.2 kg 58 kg 56.4 kg    Examination:  General exam: appears frustrated  Respiratory system: diminished breath sounds b/l  Cardiovascular system: S1/S2+. No rubs or clicks Gastrointestinal system: abd is soft, NT, ND & hypoactive bowel sounds  Central nervous system: alert & oriented. Moves all extremities  Psychiatry: Judgement and insight appears normal. Frustrated mood and affect    Data Reviewed: I have personally reviewed following labs and imaging studies  CBC: Recent Labs  Lab 08/24/23 1321 08/24/23 1332 08/25/23 0549 08/26/23 0949  WBC 31.7* 32.5* 19.7* 14.4*  NEUTROABS 26.8*  --   --   --   HGB 12.2 12.1 10.5* 11.5*  HCT 37.3 36.7 31.0* 34.2*  MCV 101.4* 98.9 99.0 97.2  PLT 409* 409* 364 460*   Basic Metabolic Panel: Recent Labs  Lab 08/24/23 1332 08/25/23 0549 08/26/23 0949  NA 134* 135 133*  K 4.9 5.3* 4.1  CL 98 100 95*  CO2 20* 22 25  GLUCOSE 198* 86 105*  BUN 54* 62* 36*  CREATININE 7.67* 8.64* 5.70*  CALCIUM 7.9* 7.8* 7.8*   GFR: Estimated Creatinine Clearance: 4.8 mL/min (A) (by C-G formula based on SCr of 5.7 mg/dL (H)). Liver  Function Tests: Recent Labs  Lab 08/24/23 1332  AST 29  ALT 17  ALKPHOS 98  BILITOT 0.9  PROT 8.0  ALBUMIN 3.2*   No results for input(s): "LIPASE", "AMYLASE" in the last 168 hours. No results for input(s): "AMMONIA" in the last 168 hours. Coagulation Profile: No results for input(s): "INR", "PROTIME" in the last 168 hours. Cardiac Enzymes: No results for input(s): "CKTOTAL", "CKMB", "CKMBINDEX", "TROPONINI" in the last 168 hours. BNP (last 3 results) No results for input(s): "PROBNP" in the last 8760 hours. HbA1C: No results for input(s): "HGBA1C" in the last 72 hours. CBG: No results for input(s): "GLUCAP" in the last 168 hours. Lipid  Profile: No results for input(s): "CHOL", "HDL", "LDLCALC", "TRIG", "CHOLHDL", "LDLDIRECT" in the last 72 hours. Thyroid Function Tests: No results for input(s): "TSH", "T4TOTAL", "FREET4", "T3FREE", "THYROIDAB" in the last 72 hours. Anemia Panel: No results for input(s): "VITAMINB12", "FOLATE", "FERRITIN", "TIBC", "IRON", "RETICCTPCT" in the last 72 hours.  Sepsis Labs: Recent Labs  Lab 08/24/23 1332  PROCALCITON 1.95    Recent Results (from the past 240 hour(s))  Blood culture (routine x 2)     Status: None (Preliminary result)   Collection Time: 08/24/23  5:21 PM   Specimen: BLOOD  Result Value Ref Range Status   Specimen Description BLOOD BLOOD RIGHT FOREARM  Final   Special Requests   Final    BOTTLES DRAWN AEROBIC AND ANAEROBIC Blood Culture adequate volume   Culture   Final    NO GROWTH 2 DAYS Performed at [facility], [address], [place], [place] [address]    Report Status PENDING  Incomplete  Blood culture (routine x 2)     Status: None (Preliminary result)   Collection Time: 08/24/23  8:36 PM   Specimen: BLOOD RIGHT ARM  Result Value Ref Range Status   Specimen Description BLOOD RIGHT ARM  Final   Special Requests   Final    BOTTLES DRAWN AEROBIC AND ANAEROBIC Blood Culture results may not be optimal due to an excessive volume of blood received in culture bottles   Culture   Final    NO GROWTH 2 DAYS Performed at [facility], [address] [place], [place] [address]    Report Status PENDING  Incomplete  MRSA Next Gen by PCR, Nasal     Status: None   Collection Time: 08/25/23  5:38 PM   Specimen: Nasal Mucosa; Nasal Swab  Result Value Ref Range Status   MRSA by PCR Next Gen NOT DETECTED NOT DETECTED Final    Comment: (NOTE) The GeneXpert MRSA Assay (FDA approved for NASAL specimens only), is one component of a comprehensive MRSA colonization surveillance program. It is not intended to diagnose MRSA infection nor to guide or monitor  treatment for MRSA infections. [name] performance is not FDA approved in patients less than 48 years old. Performed at [facility], [address], [place], [place] [address]          Radiology Studies: No results found.      Scheduled Meds:  amLODipine  10 mg Oral Daily   aspirin EC  81 mg Oral Daily   Chlorhexidine Gluconate Cloth  6 each Topical Q0600   gabapentin  100 mg Oral BID   gabapentin  200 mg Oral QHS   heparin  5,000 Units Subcutaneous Q8H   hydroxyurea  300 mg Oral QAC breakfast   latanoprost  1 drop Both Eyes QHS   levothyroxine  88 mcg Oral  [id number]   rOPINIRole  0.5 mg Oral BID   rosuvastatin  20 mg Oral Daily   sodium chloride flush  3 mL Intravenous Q12H   timolol  1 drop Both Eyes q morning   Continuous Infusions:     LOS: 0 days       Charise Killian, MD [facility] Pager [phone]  If 7PM-7AM, please contact night-coverage [email or web address]  08/26/2023, 2:45 PM

## 2023-08-26 NOTE — Progress Notes (Signed)
[facility], [place] 08/26/23  Subjective:   LOS: 0  Patient known to our practice From outpatient dialysis.  She dialyzes at [facility] TTS first shift.  She was sent over from dialysis with shortness of breath and hypoxia.  She received about 45 minutes of treatment.  Postweight of 61.2 kg.  Target weight of 58.5 kg.    Patient sitting up in bed Alert and oriented Denies pain Weaned to 3L [place] Requesting to transfer to different outpatient dialysis clinic.   Objective:  Vital signs in last 24 hours:  Temp:  [98 F (36.7 C)-98.3 F (36.8 C)] 98.3 F (36.8 C) (12/01 0745) Pulse Rate:  [73-84] 73 (12/01 0745) Resp:  [17-20] 17 (12/01 0745) BP: (111-134)/(46-60) 111/55 (12/01 0745) SpO2:  [91 %-96 %] 96 % (12/01 0745) Weight:  [56.4 kg] 56.4 kg (11/30 1300)  Weight change: -3.2 kg Filed Weights   08/24/23 1318 08/25/23 0844 08/25/23 1300  Weight: 61.2 kg 58 kg 56.4 kg    Intake/Output:    Intake/Output Summary (Last 24 hours) at 08/26/2023 1232 Last data filed at 08/25/2023 1439 Gross per 24 hour  Intake 240 ml  Output 2000 ml  Net -1760 ml     Physical Exam: General: Laying in the bed, no acute distress  HEENT Moist oral mucous membranes  Pulm/lungs  O2, normal breathing effort, decreased breath sounds at bases  CVS/Heart Regular, no rub  Abdomen:  Soft, nontender, nondistended  Extremities: Trace edema  Neurologic: Alert, oriented  Skin: No acute rashes  Access: Right IJ PermCath, left upper arm AV fistula developing       Basic Metabolic Panel:  Recent Labs  Lab 08/24/23 1332 08/25/23 0549 08/26/23 0949  NA 134* 135 133*  K 4.9 5.3* 4.1  CL 98 100 95*  CO2 20* 22 25  GLUCOSE 198* 86 105*  BUN 54* 62* 36*  CREATININE 7.67* 8.64* 5.70*  CALCIUM 7.9* 7.8* 7.8*     CBC: Recent Labs  Lab 08/24/23 1321 08/24/23 1332 08/25/23 0549 08/26/23 0949  WBC 31.7* 32.5* 19.7* 14.4*  NEUTROABS 26.8*  --   --    --   HGB 12.2 12.1 10.5* 11.5*  HCT 37.3 36.7 31.0* 34.2*  MCV 101.4* 98.9 99.0 97.2  PLT 409* 409* 364 460*      Lab Results  Component Value Date   HEPBSAG NON REACTIVE 08/24/2023      Microbiology:  Recent Results (from the past 240 hour(s))  Blood culture (routine x 2)     Status: None (Preliminary result)   Collection Time: 08/24/23  5:21 PM   Specimen: BLOOD  Result Value Ref Range Status   Specimen Description BLOOD BLOOD RIGHT FOREARM  Final   Special Requests   Final    BOTTLES DRAWN AEROBIC AND ANAEROBIC Blood Culture adequate volume   Culture   Final    NO GROWTH 2 DAYS Performed at [facility], [address] [place], [place] [address]    Report Status PENDING  Incomplete  Blood culture (routine x 2)     Status: None (Preliminary result)   Collection Time: 08/24/23  8:36 PM   Specimen: BLOOD RIGHT ARM  Result Value Ref Range Status   Specimen Description BLOOD RIGHT ARM  Final   Special Requests   Final    BOTTLES DRAWN AEROBIC AND ANAEROBIC Blood Culture results may not be optimal due to an excessive volume of blood received in culture bottles  Culture   Final    NO GROWTH 2 DAYS Performed at [facility], [address] [place], [place] [address]    Report Status PENDING  Incomplete  MRSA Next Gen by PCR, Nasal     Status: None   Collection Time: 08/25/23  5:38 PM   Specimen: Nasal Mucosa; Nasal Swab  Result Value Ref Range Status   MRSA by PCR Next Gen NOT DETECTED NOT DETECTED Final    Comment: (NOTE) The GeneXpert MRSA Assay (FDA approved for NASAL specimens only), is one component of a comprehensive MRSA colonization surveillance program. It is not intended to diagnose MRSA infection nor to guide or monitor treatment for MRSA infections. Test performance is not FDA approved in patients less than 7 years old. Performed at [facility], [address] [place], [place] [address]     Coagulation Studies: No  results for input(s): "LABPROT", "INR" in the last 72 hours.  Urinalysis: No results for input(s): "COLORURINE", "LABSPEC", "PHURINE", "GLUCOSEU", "HGBUR", "BILIRUBINUR", "KETONESUR", "PROTEINUR", "UROBILINOGEN", "NITRITE", "LEUKOCYTESUR" in the last 72 hours.  Invalid input(s): "APPERANCEUR"    Imaging: DG Chest 2 View  Result Date: 08/24/2023 CLINICAL DATA:  Shortness of breath EXAM: CHEST - 2 VIEW COMPARISON:  Chest radiograph dated 03/05/2023 FINDINGS: Right internal jugular venous catheter tip projects over the superior cavoatrial junction. Normal lung volumes. Mild bilateral interstitial and bibasilar patchy opacities. Blunting of the bilateral costophrenic angles. No pneumothorax. Similar mildly enlarged cardiomediastinal silhouette. No acute osseous abnormality. IMPRESSION: 1. Mild bilateral interstitial and bibasilar patchy opacities, may reflect a combination of pulmonary edema and atelectasis. 2. Small bilateral pleural effusions. Electronically Signed   By: Agustin Cree M.D.   On: 08/24/2023 13:50     Medications:      amLODipine  10 mg Oral Daily   aspirin EC  81 mg Oral Daily   Chlorhexidine Gluconate Cloth  6 each Topical Q0600   gabapentin  100 mg Oral BID   gabapentin  200 mg Oral QHS   heparin  5,000 Units Subcutaneous Q8H   hydroxyurea  300 mg Oral QAC breakfast   latanoprost  1 drop Both Eyes QHS   levothyroxine  88 mcg Oral Q0600   rOPINIRole  0.5 mg Oral BID   rosuvastatin  20 mg Oral Daily   sodium chloride flush  3 mL Intravenous Q12H   timolol  1 drop Both Eyes q morning   acetaminophen **OR** acetaminophen, ALPRAZolam, ondansetron **OR** ondansetron (ZOFRAN) IV  Assessment/ Plan:  89 y.o. female with diabetes, hypertension, hypothyroidism, coronary artery disease, diastolic CHF, thrombocytosis and erythrocytosis, end-stage renal disease was admitted on 08/24/2023 for  Principal Problem:   Acute hypoxic respiratory failure (HCC) Active Problems:    Hypertension   Diabetes mellitus, type II (HCC)   Acquired hypothyroidism   CAD (coronary artery disease), native coronary artery   Polycythemia vera (HCC)   Leukocytosis   ESRD (end stage renal disease) on dialysis (HCC)  ESRD (end stage renal disease) on dialysis (HCC) [N18.6, Z99.2] Acute respiratory failure with hypoxia (HCC) [J96.01] Acute hypoxic respiratory failure (HCC) [J96.01]  #.  Shortness of breath, acute hypoxic respiratory failure in the setting of ESRD -Likely secondary to volume overload. -Oxygen saturations have improved with oxygen supplementation by nasal cannula. -Chest x-ray shows Mild bilateral interstitial and bibasilar patchy opacities may be combination of pulmonary edema and atelectasis.  Small bilateral pleural effusions.  Blood cultures are pending.  WBC count elevated at 32.5, predominantly neutrophils.  Potassium level  is normal at 4.9.  BNP 430.  Troponin of 32. 2D echo from March 06, 2023 shows LVEF of 60 to 65%, grade 1 diastolic dysfunction, normal right ventricular function.  Mild to moderate mitral regurgitation, mild to moderate aortic regurgitation.  No aortic stenosis.  - Dialysis received yesterday with UF 2L achieved.  - Weaned to 3L [place] - Next treatment scheduled for Tuesday.  - Patient cleared to discharge from renal stance.  - Will follow up outpatient with outpatient clinic transfer  #. Anemia of CKD, history of thrombocytosis, macrocytosis, JAK2 gene mutation  Lab Results  Component Value Date   HGB 11.5 (L) 08/26/2023   Hgb remains acceptable. Will consider low dose EPO with HD if hemoglobin trends lower than 10.   #. Secondary hyperparathyroidism of renal origin N 25.81   No results found for: "PTH" Lab Results  Component Value Date   PHOS 6.1 (H) 03/13/2023   Calcium remains 7.8. Will continue to monitor bone minerals  #. Mild hyperkalemia, potassium corrected with dialysis    LOS: 0 Cassandra [name] 12/1/202412:32  PM  [facility] [place], [place] [phone]

## 2023-08-27 ENCOUNTER — Ambulatory Visit (INDEPENDENT_AMBULATORY_CARE_PROVIDER_SITE_OTHER): Payer: Medicare Other | Admitting: Vascular Surgery

## 2023-08-27 ENCOUNTER — Encounter (INDEPENDENT_AMBULATORY_CARE_PROVIDER_SITE_OTHER): Payer: Medicare Other

## 2023-08-27 DIAGNOSIS — J9601 Acute respiratory failure with hypoxia: Secondary | ICD-10-CM | POA: Diagnosis not present

## 2023-08-27 LAB — BASIC METABOLIC PANEL
Anion gap: 11 (ref 5–15)
BUN: 49 mg/dL — ABNORMAL HIGH (ref 8–23)
CO2: 26 mmol/L (ref 22–32)
Calcium: 7.7 mg/dL — ABNORMAL LOW (ref 8.9–10.3)
Chloride: 95 mmol/L — ABNORMAL LOW (ref 98–111)
Creatinine, Ser: 6.71 mg/dL — ABNORMAL HIGH (ref 0.44–1.00)
GFR, Estimated: 5 mL/min — ABNORMAL LOW (ref >60)
Glucose, Bld: 86 mg/dL (ref 70–99)
Potassium: 4.5 mmol/L (ref 3.5–5.1)
Sodium: 132 mmol/L — ABNORMAL LOW (ref 135–145)

## 2023-08-27 LAB — CBC
HCT: 35.8 % — ABNORMAL LOW (ref 36.0–46.0)
Hemoglobin: 12.1 g/dL (ref 12.0–15.0)
MCH: 32.8 pg (ref 26.0–34.0)
MCHC: 33.8 g/dL (ref 30.0–36.0)
MCV: 97 fL (ref 80.0–100.0)
Platelets: 524 10*3/uL — ABNORMAL HIGH (ref 150–400)
RBC: 3.69 MIL/uL — ABNORMAL LOW (ref 3.87–5.11)
RDW: 15 % (ref 11.5–15.5)
WBC: 12.1 10*3/uL — ABNORMAL HIGH (ref 4.0–10.5)
nRBC: 0 % (ref 0.0–0.2)

## 2023-08-27 LAB — HEPATITIS B SURFACE ANTIBODY, QUANTITATIVE: Hep B S AB Quant (Post): <3.5 m[IU]/mL — ABNORMAL LOW

## 2023-08-27 MED ORDER — LIDOCAINE 5 % EX PTCH
1.0000 | MEDICATED_PATCH | Freq: Once | CUTANEOUS | Status: AC
  Administered 2023-08-27: 1 via TRANSDERMAL
  Filled 2023-08-27: qty 1

## 2023-08-27 MED ORDER — HYDROCODONE-ACETAMINOPHEN 5-325 MG PO TABS
1.0000 | ORAL_TABLET | Freq: Four times a day (QID) | ORAL | Status: DC | PRN
  Administered 2023-08-27: 1 via ORAL
  Filled 2023-08-27: qty 1

## 2023-08-27 MED ORDER — HEPARIN SODIUM (PORCINE) 1000 UNIT/ML IJ SOLN
INTRAMUSCULAR | Status: AC
  Filled 2023-08-27: qty 10

## 2023-08-27 NOTE — Progress Notes (Signed)
Please call and advise the patient that the recent Gabapentin level we checked was elevated but this is due on the fact that she taking the highest possible dose of gabapentin while being on dialysis. Her Ferritin was elevated but this is due to her ESRD. Continue with medications as prescribed and please remind patient to keep any upcoming appointments or tests and to call us with any interim questions, concerns, problems or updates. Thanks,   Windell Norfolk, MD

## 2023-08-27 NOTE — Progress Notes (Signed)
[facility], [place] 08/27/23  Subjective:   LOS: 0  Patient known to our practice From outpatient dialysis.  She dialyzes at [facility] TTS first shift.  She was sent over from dialysis with shortness of breath and hypoxia.  She received about 45 minutes of treatment.  Postweight of 61.2 kg.  Target weight of 58.5 kg.    Update: Patient seen and evaluated during dialysis   HEMODIALYSIS FLOWSHEET:  Blood Flow Rate (mL/min): 399 mL/min Arterial Pressure (mmHg): -238.17 mmHg Venous Pressure (mmHg): 177.77 mmHg TMP (mmHg): 0.61 mmHg Ultrafiltration Rate (mL/min): 829 mL/min Dialysate Flow Rate (mL/min): 300 ml/min  Complaining of generalized pain HD RN to offer pain medication Remains on 3L [place]   Objective:  Vital signs in last 24 hours:  Temp:  [97.5 F (36.4 C)-98.2 F (36.8 C)] 98 F (36.7 C) (12/02 1011) Pulse Rate:  [71-76] 73 (12/02 1031) Resp:  [16-20] 20 (12/02 1031) BP: (110-127)/(49-96) 127/49 (12/02 1031) SpO2:  [91 %-98 %] 95 % (12/02 1031) Weight:  [57.7 kg] 57.7 kg (12/02 1018)  Weight change:  Filed Weights   08/25/23 0844 08/25/23 1300 08/27/23 1018  Weight: 58 kg 56.4 kg 57.7 kg    Intake/Output:    Intake/Output Summary (Last 24 hours) at 08/27/2023 1102 Last data filed at 08/27/2023 0650 Gross per 24 hour  Intake 360 ml  Output --  Net 360 ml     Physical Exam: General: Laying in the bed, no acute distress  HEENT Moist oral mucous membranes  Pulm/lungs [place] O2, normal breathing effort, decreased breath sounds at bases  CVS/Heart Regular, no rub  Abdomen:  Soft, nontender, nondistended  Extremities: Trace edema  Neurologic: Alert, oriented  Skin: No acute rashes  Access: Right IJ PermCath, left upper arm AV fistula developing       Basic Metabolic Panel:  Recent Labs  Lab 08/24/23 1332 08/25/23 0549 08/26/23 0949 08/27/23 0408  NA 134* 135 133* 132*  K 4.9 5.3* 4.1 4.5  CL 98 100 95* 95*   CO2 20* 22 25 26   GLUCOSE 198* 86 105* 86  BUN 54* 62* 36* 49*  CREATININE 7.67* 8.64* 5.70* 6.71*  CALCIUM 7.9* 7.8* 7.8* 7.7*     CBC: Recent Labs  Lab 08/24/23 1321 08/24/23 1332 08/25/23 0549 08/26/23 0949 08/27/23 0408  WBC 31.7* 32.5* 19.7* 14.4* 12.1*  NEUTROABS 26.8*  --   --   --   --   HGB 12.2 12.1 10.5* 11.5* 12.1  HCT 37.3 36.7 31.0* 34.2* 35.8*  MCV 101.4* 98.9 99.0 97.2 97.0  PLT 409* 409* 364 460* 524*      Lab Results  Component Value Date   HEPBSAG NON REACTIVE 08/24/2023      Microbiology:  Recent Results (from the past 240 hour(s))  Blood culture (routine x 2)     Status: None (Preliminary result)   Collection Time: 08/24/23  5:21 PM   Specimen: BLOOD  Result Value Ref Range Status   Specimen Description BLOOD BLOOD RIGHT FOREARM  Final   Special Requests   Final    BOTTLES DRAWN AEROBIC AND ANAEROBIC Blood Culture adequate volume   Culture   Final    NO GROWTH 3 DAYS Performed at [facility], [address] [place], [place] [address]    Report Status PENDING  Incomplete  Blood culture (routine x 2)     Status: None (Preliminary result)   Collection Time: 08/24/23  8:36 PM  Specimen: BLOOD RIGHT ARM  Result Value Ref Range Status   Specimen Description BLOOD RIGHT ARM  Final   Special Requests   Final    BOTTLES DRAWN AEROBIC AND ANAEROBIC Blood Culture results may not be optimal due to an excessive volume of blood received in culture bottles   Culture   Final    NO GROWTH 3 DAYS Performed at [facility], [address], [place], [place] [address]    Report Status PENDING  Incomplete  MRSA Next Gen by PCR, Nasal     Status: None   Collection Time: 08/25/23  5:38 PM   Specimen: Nasal Mucosa; Nasal Swab  Result Value Ref Range Status   MRSA by PCR Next Gen NOT DETECTED NOT DETECTED Final    Comment: (NOTE) The GeneXpert MRSA Assay (FDA approved for NASAL specimens only), is one component of a comprehensive  MRSA colonization surveillance program. It is not intended to diagnose MRSA infection nor to guide or monitor treatment for MRSA infections. Test performance is not FDA approved in patients less than 17 years old. Performed at [facility], [address] [place], [place] [address]     Coagulation Studies: No results for input(s): "LABPROT", "INR" in the last 72 hours.  Urinalysis: No results for input(s): "COLORURINE", "LABSPEC", "PHURINE", "GLUCOSEU", "HGBUR", "BILIRUBINUR", "KETONESUR", "PROTEINUR", "UROBILINOGEN", "NITRITE", "LEUKOCYTESUR" in the last 72 hours.  Invalid input(s): "APPERANCEUR"    Imaging: No results found.   Medications:      amLODipine  10 mg Oral Daily   aspirin EC  81 mg Oral Daily   Chlorhexidine Gluconate Cloth  6 each Topical Q0600   gabapentin  100 mg Oral BID   gabapentin  200 mg Oral QHS   heparin  5,000 Units Subcutaneous Q8H   hydroxyurea  300 mg Oral QAC breakfast   latanoprost  1 drop Both Eyes QHS   levothyroxine  88 mcg Oral Q0600   lidocaine  1 patch Transdermal Once   rOPINIRole  0.5 mg Oral BID   rosuvastatin  20 mg Oral Daily   sodium chloride flush  3 mL Intravenous Q12H   timolol  1 drop Both Eyes q morning   acetaminophen **OR** acetaminophen, ALPRAZolam, HYDROcodone-acetaminophen, ondansetron **OR** ondansetron (ZOFRAN) IV  Assessment/ Plan:  89 y.o. female with diabetes, hypertension, hypothyroidism, coronary artery disease, diastolic CHF, thrombocytosis and erythrocytosis, end-stage renal disease was admitted on 08/24/2023 for  Principal Problem:   Acute hypoxic respiratory failure (HCC) Active Problems:   Hypertension   Diabetes mellitus, type II (HCC)   Acquired hypothyroidism   CAD (coronary artery disease), native coronary artery   Polycythemia vera (HCC)   Leukocytosis   ESRD (end stage renal disease) on dialysis (HCC)  ESRD (end stage renal disease) on dialysis (HCC) [N18.6, Z99.2] Acute  respiratory failure with hypoxia (HCC) [J96.01] Acute hypoxic respiratory failure (HCC) [J96.01]  #.  Shortness of breath, acute hypoxic respiratory failure in the setting of ESRD -Likely secondary to volume overload. -Oxygen saturations have improved with oxygen supplementation by nasal cannula. -Chest x-ray shows Mild bilateral interstitial and bibasilar patchy opacities may be combination of pulmonary edema and atelectasis.  Small bilateral pleural effusions.  Blood cultures are pending.  WBC count elevated at 32.5, predominantly neutrophils.  Potassium level is normal at 4.9.  BNP 430.  Troponin of 32. 2D echo from March 06, 2023 shows LVEF of 60 to 65%, grade 1 diastolic dysfunction, normal right ventricular function.  Mild to moderate mitral regurgitation, mild  to moderate aortic regurgitation.  No aortic stenosis.  -Receiving dialysis today for additional fluid removal.  -Will attempt 2L UF - Next treatment scheduled for Tuesday  #. Anemia of CKD, history of thrombocytosis, macrocytosis, JAK2 gene mutation  Lab Results  Component Value Date   HGB 12.1 08/27/2023   Hgb within desired range. No need for ESA during this admission  #. Secondary hyperparathyroidism of renal origin N 25.81   No results found for: "PTH" Lab Results  Component Value Date   PHOS 6.1 (H) 03/13/2023   Calcium 7.7. Will continue to monitor bone minerals  #. Mild hyperkalemia, potassium corrected     LOS: 0 [facility] 12/2/202411:02 AM  [facility] [place], [place] [phone]

## 2023-08-27 NOTE — Progress Notes (Signed)
PT Cancellation Note  Patient Details Name: Cassandra [name] MRN: [id number] DOB: September 05, 1927   Cancelled Treatment:    Reason Eval/Treat Not Completed: Patient at procedure or test/unavailable Pt noted to be off the floor at dialysis at this time. Will f/u as able.  Aleda Grana, PT, DPT 08/27/23, 1:03 PM   Sandi Mariscal 08/27/2023, 1:03 PM

## 2023-08-27 NOTE — Discharge Summary (Signed)
Physician Discharge Summary  Cassandra [name] OZD:[id number] DOB: 1927/08/27 DOA: 08/24/2023  PCP: Danella Penton, MD  Admit date: 08/24/2023 Discharge date: 08/27/2023  Admitted From: home  Disposition:  home w/ home health   Recommendations for Outpatient Follow-up:  Follow up with PCP in 1-2 weeks F/u w/ nephro in 1-2 weeks  Home Health: yes  Equipment/Devices: 2L [place]   Discharge Condition: stable  CODE STATUS: full  Diet recommendation: Carb Modified / Renal   Brief/Interim Summary: HPI was taken from Dr. Huel Cote: Cassandra [name] is a 89 y.o. female with medical history significant of ESRD on HD (TThSa), chronic leukocytosis, type 2 diabetes, hypertension, lumbar stenosis with neurogenic claudication, hypertension, hyperlipidemia, CAD s/p PCI 1999, who presents to the ED due to shortness of breath.   [name] states that she was scheduled for dialysis yesterday but the center was closed due to Thanksgiving.  She was set up for a session today, however in the middle of her session, she was noted to have low oxygen levels.  She endorsed shortness of breath at that time.  She notes that yesterday, she felt more tired than usual, but otherwise denies any fever, chills, nausea, vomiting, diarrhea, abdominal pain, cough, runny nose, congestion, chest pain or lower extremity swelling.  She notes that overall, she feels in her usual state of health.   ED course: On arrival to the ED, patient was normotensive at 113/88 with heart rate of 90.  She was saturating at 93% on room air before being weaned down to 3 L.  She was afebrile at 97.9. Initial workup notable for WBC of 32.5, platelets of 409, sodium 134, bicarb 20, glucose 198, BUN 54, creatinine 7.67 with GFR 5.  BNP elevated 429.  Chest x-ray with bilateral interstitial patchy opacities concerning for edema versus atelectasis with small bilateral pleural effusions.  Nephrology consulted for dialysis, [facility] contacted for  admission.    Discharge Diagnoses:  Principal Problem:   Acute hypoxic respiratory failure (HCC) Active Problems:   Leukocytosis   ESRD (end stage renal disease) on dialysis (HCC)   Hypertension   Diabetes mellitus, type II (HCC)   Acquired hypothyroidism   CAD (coronary artery disease), native coronary artery   Polycythemia vera (HCC)  Acute hypoxic respiratory failure: likely secondary to not getting HD ( center was closed for the holidays). Found to be 88% on RA. Continue on supplemental oxygen and wean as tolerated    Leukocytosis: chronic. Likely secondary polycythemia vera as per onco. Onco recs apprec   ESRD: on HD. Nephro following and recs apprec    Hyperkalemia: resolved     Polycythemia vera: continue on home dose of hydroxyurea    Hx of CAD: no CP currently    Hypothyroidism: continue on home dose of levothyroxine    DM2: well controlled. Diet controlled    Peripheral neuropathy: continue on home dose of gabapentin    HTN: continue on home dose of amlodipine   Discharge Instructions  Discharge Instructions     Diet Carb Modified   Complete by: As directed    And renal diet   Discharge instructions   Complete by: As directed    F/u w/ PCP in 1-2 weeks. F/u w/ nephro in 1-2 weeks   Increase activity slowly   Complete by: As directed       Allergies as of 08/27/2023       Reactions   Atorvastatin Other (See Comments)   MYALGIA   Cyclobenzaprine Other (See Comments)  hallucination   Hydrocodone-acetaminophen    Other reaction(s): Hallucination when taking whole pill, pt tolerates taking 1/2 pill   Mirtazapine    Other reaction(s): Hallucination   Oxycodone-acetaminophen Other (See Comments)   hallucination   Paroxetine Hcl Other (See Comments)   hallucination   Penicillins Swelling   Lip and orbital swelling Tolerated 3rd generation cephalosporin (CEFTRIAXONE) between 03/05/2023 and 03/09/2023 with no documented ADRs.   Propoxyphene Other  (See Comments)   hallucination   Trazodone Other (See Comments)   hallucination        Medication List     STOP taking these medications    cloNIDine 0.1 MG tablet Commonly known as: Catapres   doxycycline 100 MG capsule Commonly known as: VIBRAMYCIN       TAKE these medications    ALPRAZolam 0.25 MG tablet Commonly known as: XANAX Take 1 tablet (0.25 mg total) by mouth daily as needed.   amLODipine 10 MG tablet Commonly known as: NORVASC Take 10 mg by mouth daily.   aspirin EC 81 MG tablet Take 1 tablet (81 mg total) by mouth daily. Swallow whole.   calcium acetate 667 MG capsule Commonly known as: PHOSLO Take 1,334 mg by mouth 3 (three) times daily.   cetirizine 10 MG tablet Commonly known as: ZYRTEC Take 1 tablet by mouth daily as needed for allergies.   [employer] D3 25 MCG (1000 UT) capsule Generic drug: Cholecalciferol Take 1,000 Units by mouth daily.   Droxia 300 MG capsule Generic drug: hydroxyurea TAKE 1 CAPSULE (300 MG TOTAL) BY MOUTH DAILY. MAY TAKE WITH FOOD TO MINIMIZE GI SIDE EFFECTS.   feeding supplement (NEPRO CARB STEADY) Liqd Take 237 mLs by mouth 2 (two) times daily between meals.   gabapentin 100 MG capsule Commonly known as: NEURONTIN Take 2 capsules (200 mg total) by mouth at bedtime. What changed:  how much to take when to take this additional instructions   glucose blood test strip Use once daily. Use as instructed.   HYDROcodone-acetaminophen 5-325 MG tablet Commonly known as: NORCO/VICODIN Take 1 tablet by mouth every 6 (six) hours as needed for moderate pain (pain score 4-6) or severe pain (pain score 7-10).   latanoprost 0.005 % ophthalmic solution Commonly known as: XALATAN Place 1 drop into both eyes at bedtime.   levothyroxine 88 MCG tablet Commonly known as: SYNTHROID Take 88 mcg by mouth daily. Take on an empty stomach 30 to 60 minutes before breakfast   multivitamin Tabs tablet Take 1 tablet by mouth at  bedtime.   rOPINIRole 0.5 MG tablet Commonly known as: REQUIP Take 1 tablet (0.5 mg total) by mouth in the morning and at bedtime.   rosuvastatin 20 MG tablet Commonly known as: CRESTOR Take 20 mg by mouth daily.   timolol 0.5 % ophthalmic gel-forming Commonly known as: TIMOPTIC-XR Place 1 drop into both eyes every morning.   traMADol 50 MG tablet Commonly known as: Ultram Take 1 tablet (50 mg total) by mouth every 6 (six) hours as needed.               Durable Medical Equipment  (From admission, onward)           Start     Ordered   08/26/23 1245  For home use only DME oxygen  Once       Question Answer Comment  Length of Need 6 Months   Mode or (Route) Nasal cannula   Liters per Minute 2   Frequency Continuous (stationary and  portable oxygen unit needed)   Oxygen conserving device Yes   Oxygen delivery system Gas      08/26/23 1244            Allergies  Allergen Reactions   Atorvastatin Other (See Comments)    MYALGIA   Cyclobenzaprine Other (See Comments)    hallucination   Hydrocodone-Acetaminophen     Other reaction(s): Hallucination when taking whole pill, pt tolerates taking 1/2 pill   Mirtazapine     Other reaction(s): Hallucination   Oxycodone-Acetaminophen Other (See Comments)    hallucination   Paroxetine Hcl Other (See Comments)    hallucination   Penicillins Swelling    Lip and orbital swelling Tolerated 3rd generation cephalosporin (CEFTRIAXONE) between 03/05/2023 and 03/09/2023 with no documented ADRs.   Propoxyphene Other (See Comments)    hallucination   Trazodone Other (See Comments)    hallucination    Consultations: Nephro    Procedures/Studies: DG Chest 2 View  Result Date: 08/24/2023 CLINICAL DATA:  Shortness of breath EXAM: CHEST - 2 VIEW COMPARISON:  Chest radiograph dated 03/05/2023 FINDINGS: Right internal jugular venous catheter tip projects over the superior cavoatrial junction. Normal lung volumes. Mild  bilateral interstitial and bibasilar patchy opacities. Blunting of the bilateral costophrenic angles. No pneumothorax. Similar mildly enlarged cardiomediastinal silhouette. No acute osseous abnormality. IMPRESSION: 1. Mild bilateral interstitial and bibasilar patchy opacities, may reflect a combination of pulmonary edema and atelectasis. 2. Small bilateral pleural effusions. Electronically Signed   By: Agustin Cree M.D.   On: 08/24/2023 13:50   (Echo, Carotid, EGD, Colonoscopy, ERCP)    Subjective: Pt c/o fatigue    Discharge Exam: Vitals:   08/27/23 1400 08/27/23 1406  BP: (!) 110/54 124/62  Pulse: 79 76  Resp:  12  Temp:  97.9 F (36.6 C)  SpO2: 99% 97%   Vitals:   08/27/23 1330 08/27/23 1400 08/27/23 1406 08/27/23 1432  BP: 106/60 (!) 110/54 124/62   Pulse: 73 79 76   Resp:   12   Temp:   97.9 F (36.6 C)   TempSrc:   Oral   SpO2: 96% 99% 97%   Weight:    56.5 kg  Height:        General: Pt is alert, awake, not in acute distress Cardiovascular: S1/S2 +, no rubs, no gallops Respiratory: decreased breath sounds b/l otherwise clear  Abdominal: Soft, NT, ND, bowel sounds + Extremities: no edema, no cyanosis    The results of significant diagnostics from this hospitalization (including imaging, microbiology, ancillary and laboratory) are listed below for reference.     Microbiology: Recent Results (from the past 240 hour(s))  Blood culture (routine x 2)     Status: None (Preliminary result)   Collection Time: 08/24/23  5:21 PM   Specimen: BLOOD  Result Value Ref Range Status   Specimen Description BLOOD BLOOD RIGHT FOREARM  Final   Special Requests   Final    BOTTLES DRAWN AEROBIC AND ANAEROBIC Blood Culture adequate volume   Culture   Final    NO GROWTH 3 DAYS Performed at [facility], [address] [place], [place] [address]    Report Status PENDING  Incomplete  Blood culture (routine x 2)     Status: None (Preliminary result)   Collection Time:  08/24/23  8:36 PM   Specimen: BLOOD RIGHT ARM  Result Value Ref Range Status   Specimen Description BLOOD RIGHT ARM  Final   Special Requests   Final  BOTTLES DRAWN AEROBIC AND ANAEROBIC Blood Culture results may not be optimal due to an excessive volume of blood received in culture bottles   Culture   Final    NO GROWTH 3 DAYS Performed at [facility], [address] [place], [place] [address]    Report Status PENDING  Incomplete  MRSA Next Gen by PCR, Nasal     Status: None   Collection Time: 08/25/23  5:38 PM   Specimen: Nasal Mucosa; Nasal Swab  Result Value Ref Range Status   MRSA by PCR Next Gen NOT DETECTED NOT DETECTED Final    Comment: (NOTE) The GeneXpert MRSA Assay (FDA approved for NASAL specimens only), is one component of a comprehensive MRSA colonization surveillance program. It is not intended to diagnose MRSA infection nor to guide or monitor treatment for MRSA infections. Test performance is not FDA approved in patients less than 27 years old. Performed at [facility], [address] [place], [place] [address]      Labs: BNP (last 3 results) Recent Labs    03/05/23 1146 08/24/23 1332  BNP 792.3* 429.9*   Basic Metabolic Panel: Recent Labs  Lab 08/24/23 1332 08/25/23 0549 08/26/23 0949 08/27/23 0408  NA 134* 135 133* 132*  K 4.9 5.3* 4.1 4.5  CL 98 100 95* 95*  CO2 20* 22 25 26   GLUCOSE 198* 86 105* 86  BUN 54* 62* 36* 49*  CREATININE 7.67* 8.64* 5.70* 6.71*  CALCIUM 7.9* 7.8* 7.8* 7.7*   Liver Function Tests: Recent Labs  Lab 08/24/23 1332  AST 29  ALT 17  ALKPHOS 98  BILITOT 0.9  PROT 8.0  ALBUMIN 3.2*   No results for input(s): "LIPASE", "AMYLASE" in the last 168 hours. No results for input(s): "AMMONIA" in the last 168 hours. CBC: Recent Labs  Lab 08/24/23 1321 08/24/23 1332 08/25/23 0549 08/26/23 0949 08/27/23 0408  WBC 31.7* 32.5* 19.7* 14.4* 12.1*  NEUTROABS 26.8*  --   --   --   --   HGB 12.2  12.1 10.5* 11.5* 12.1  HCT 37.3 36.7 31.0* 34.2* 35.8*  MCV 101.4* 98.9 99.0 97.2 97.0  PLT 409* 409* 364 460* 524*   Cardiac Enzymes: No results for input(s): "CKTOTAL", "CKMB", "CKMBINDEX", "TROPONINI" in the last 168 hours. BNP: Invalid input(s): "POCBNP" CBG: No results for input(s): "GLUCAP" in the last 168 hours. D-Dimer No results for input(s): "DDIMER" in the last 72 hours. Hgb A1c No results for input(s): "HGBA1C" in the last 72 hours. Lipid Profile No results for input(s): "CHOL", "HDL", "LDLCALC", "TRIG", "CHOLHDL", "LDLDIRECT" in the last 72 hours. Thyroid function studies No results for input(s): "TSH", "T4TOTAL", "T3FREE", "THYROIDAB" in the last 72 hours.  Invalid input(s): "FREET3" Anemia work up No results for input(s): "VITAMINB12", "FOLATE", "FERRITIN", "TIBC", "IRON", "RETICCTPCT" in the last 72 hours. Urinalysis    Component Value Date/Time   COLORURINE STRAW (A) 12/12/2021 1702   APPEARANCEUR CLEAR (A) 12/12/2021 1702   LABSPEC 1.005 12/12/2021 1702   PHURINE 7.0 12/12/2021 1702   GLUCOSEU 50 (A) 12/12/2021 1702   HGBUR MODERATE (A) 12/12/2021 1702   BILIRUBINUR NEGATIVE 12/12/2021 1702   KETONESUR NEGATIVE 12/12/2021 1702   PROTEINUR 100 (A) 12/12/2021 1702   NITRITE NEGATIVE 12/12/2021 1702   LEUKOCYTESUR SMALL (A) 12/12/2021 1702   Sepsis Labs Recent Labs  Lab 08/24/23 1332 08/25/23 0549 08/26/23 0949 08/27/23 0408  WBC 32.5* 19.7* 14.4* 12.1*   Microbiology Recent Results (from the past 240 hour(s))  Blood culture (routine  x 2)     Status: None (Preliminary result)   Collection Time: 08/24/23  5:21 PM   Specimen: BLOOD  Result Value Ref Range Status   Specimen Description BLOOD BLOOD RIGHT FOREARM  Final   Special Requests   Final    BOTTLES DRAWN AEROBIC AND ANAEROBIC Blood Culture adequate volume   Culture   Final    NO GROWTH 3 DAYS Performed at [facility], [address], [place], [place] [address]    Report Status  PENDING  Incomplete  Blood culture (routine x 2)     Status: None (Preliminary result)   Collection Time: 08/24/23  8:36 PM   Specimen: BLOOD RIGHT ARM  Result Value Ref Range Status   Specimen Description BLOOD RIGHT ARM  Final   Special Requests   Final    BOTTLES DRAWN AEROBIC AND ANAEROBIC Blood Culture results may not be optimal due to an excessive volume of blood received in culture bottles   Culture   Final    NO GROWTH 3 DAYS Performed at [facility], [address], [place], [place] [address]    Report Status PENDING  Incomplete  MRSA Next Gen by PCR, Nasal     Status: None   Collection Time: 08/25/23  5:38 PM   Specimen: Nasal Mucosa; Nasal Swab  Result Value Ref Range Status   MRSA by PCR Next Gen NOT DETECTED NOT DETECTED Final    Comment: (NOTE) The GeneXpert MRSA Assay (FDA approved for NASAL specimens only), is one component of a comprehensive MRSA colonization surveillance program. It is not intended to diagnose MRSA infection nor to guide or monitor treatment for MRSA infections. Test performance is not FDA approved in patients less than 17 years old. Performed at [facility], [address], [place], [place] [address]      Time coordinating discharge: Over 30 minutes  SIGNED:   Charise Killian, MD  [facility] 08/27/2023, 3:01 PM Pager   If 7PM-7AM, please contact night-coverage [email or web address]

## 2023-08-27 NOTE — Progress Notes (Signed)
  Received patient in bed to unit.   Informed consent signed and in chart.    TX duration: 3.5hrs     Transported back to floor  Hand-off given to patient's nurse. Pt has 5/10 back pain .  Primary nurse notified     Access used: R HD catheter  Access issues: none   Total UF removed: 2.0 L Medication(s) given: Norco  Post HD weight: 56.5kg     Lynann Beaver  [facility]

## 2023-08-27 NOTE — Progress Notes (Signed)
Pt c/o back pain, took tylenol with no change. pt stated she takes hydrocodone at home to help with that. On call provider notified order received for lidocaine patch. Pt then c/o spasm in her back on call provider notified and aware, no new order received.

## 2023-08-27 NOTE — Plan of Care (Signed)

## 2023-08-27 NOTE — TOC Progression Note (Signed)
Transition of Care [facility]) - Progression Note    Patient Details  Name: Najiyyah Russek MRN: [id number] Date of Birth: 06/20/1927  Transition of Care [facility]) CM/SW Contact  Marlowe Sax, RN Phone Number: 08/27/2023, 3:56 PM  Clinical Narrative:     Sherron Monday to [place]is [place] with [employer], he is going to come and pick up the 3 in 1 that the patient is now saying she does not want, he is also going to show her how to use the portable concentrator for Oxygen again along with her spouse to ensure they are both familiar with how to use it    Barriers to Discharge: No Barriers Identified, Barriers Resolved  Expected Discharge Plan and Services         Expected Discharge Date: 08/27/23               DME Arranged: Oxygen DME Agency: [employer] Date DME Agency Contacted: 08/26/23 Time DME Agency Contacted: 1233 Representative spoke with at DME Agency: Selena Batten HH Arranged: PT, OT [facility] Agency: [facility] Date [facility] Agency Contacted: 08/26/23 Time HH Agency Contacted: 1233 Representative spoke with at [facility] Agency: Elnita Maxwell   Social Determinants of Health (SDOH) Interventions SDOH Screenings   Food Insecurity: No Food Insecurity (08/25/2023)  Housing: Low Risk  (08/25/2023)  Transportation Needs: No Transportation Needs (08/25/2023)  Utilities: Not At Risk (08/25/2023)  Financial Resource Strain: Low Risk  (06/20/2023)   Received from [facility]  Tobacco Use: Medium Risk (08/25/2023)    Readmission Risk Interventions    03/09/2023    4:09 PM  Readmission Risk Prevention Plan  Transportation Screening Complete  PCP or Specialist Appt within 3-5 Days Complete  HRI or Home Care Consult Complete  Social Work Consult for Recovery Care Planning/Counseling Complete  Palliative Care Screening Not Applicable  Medication Review [employer]) Complete

## 2023-08-29 LAB — CULTURE, BLOOD (ROUTINE X 2)
Culture: NO GROWTH
Culture: NO GROWTH
Special Requests: ADEQUATE

## 2023-09-04 ENCOUNTER — Telehealth (INDEPENDENT_AMBULATORY_CARE_PROVIDER_SITE_OTHER): Payer: Self-pay

## 2023-09-04 NOTE — Telephone Encounter (Signed)
Spoke with the patient and she is scheduled on 09/05/23 with a 2:00 pm arrival time to the [facility] for a urgent permcath removal with Dr. Wyn Quaker. Pre-procedure instructions were discussed and patient stated she understood.

## 2023-09-05 ENCOUNTER — Encounter
Admission: RE | Disposition: A | Discharge status: Home / Self Care | Payer: Self-pay | Source: Home / Self Care | Attending: Vascular Surgery

## 2023-09-05 ENCOUNTER — Ambulatory Visit
Admission: RE | Admit: 2023-09-05 | Discharge: 2023-09-05 | Disposition: A | Discharge status: Home / Self Care | Payer: Medicare Other | Attending: Vascular Surgery | Admitting: Vascular Surgery

## 2023-09-05 ENCOUNTER — Other Ambulatory Visit: Payer: Self-pay

## 2023-09-05 DIAGNOSIS — Z452 Encounter for adjustment and management of vascular access device: Secondary | ICD-10-CM | POA: Diagnosis not present

## 2023-09-05 DIAGNOSIS — N186 End stage renal disease: Secondary | ICD-10-CM | POA: Diagnosis not present

## 2023-09-05 DIAGNOSIS — Z4589 Encounter for adjustment and management of other implanted devices: Secondary | ICD-10-CM | POA: Insufficient documentation

## 2023-09-05 DIAGNOSIS — Z95828 Presence of other vascular implants and grafts: Secondary | ICD-10-CM | POA: Diagnosis not present

## 2023-09-05 DIAGNOSIS — Z992 Dependence on renal dialysis: Secondary | ICD-10-CM

## 2023-09-05 HISTORY — PX: DIALYSIS/PERMA CATHETER REMOVAL: CATH118289

## 2023-09-05 SURGERY — DIALYSIS/PERMA CATHETER REMOVAL
Anesthesia: LOCAL

## 2023-09-05 MED ORDER — LIDOCAINE-EPINEPHRINE (PF) 1 %-1:200000 IJ SOLN
INTRAMUSCULAR | Status: DC | PRN
  Administered 2023-09-05: 20 mL via INTRADERMAL

## 2023-09-05 SURGICAL SUPPLY — 3 items
CHLORAPREP W/TINT 26 (MISCELLANEOUS) IMPLANT
FORCEPS HALSTEAD CVD 5IN STRL (INSTRUMENTS) IMPLANT
TRAY LACERAT/PLASTIC (MISCELLANEOUS) IMPLANT

## 2023-09-05 NOTE — Interval H&P Note (Signed)
History and Physical Interval Note:  09/05/2023 1:30 PM  Cassandra [name]  has presented today for surgery, with the diagnosis of Perma Cath Removal   End Stage Renal.  The various methods of treatment have been discussed with the patient and family. After consideration of risks, benefits and other options for treatment, the patient has consented to  Procedure(s): DIALYSIS/PERMA CATHETER REMOVAL (N/A) as a surgical intervention.  The patient's history has been reviewed, patient examined, no change in status, stable for surgery.  I have reviewed the patient's chart and labs.  Questions were answered to the patient's satisfaction.     Festus Barren

## 2023-09-05 NOTE — Discharge Instructions (Signed)
Tunneled Catheter Removal, Care After Refer to this sheet in the next few weeks. These instructions provide you with information about caring for yourself after your procedure. Your health care provider may also give you more specific instructions. Your treatment has been planned according to current medical practices, but problems sometimes occur. Call your health care provider if you have any problems or questions after your procedure. What can I expect after the procedure? After the procedure, it is common to have: Some mild redness, swelling, and pain around your catheter site.   Follow these instructions at home: Incision care  Check your removal site  every day for signs of infection. Check for: More redness, swelling, or pain. More fluid or blood. Warmth. Pus or a bad smell. Remove your dressing in 48hrs leave open to air  Activity  Return to your normal activities as told by your health care provider. Ask your health care provider what activities are safe for you. Do not lift anything that is heavier than 10 lb (4.5 kg) for 3 days  You may shower tomorrow  Contact a health care provider if: You have more fluid or blood coming from your removal site You have more redness, swelling, or pain at your incisions or around the area where your catheter was removed Your removal site feel warm to the touch. You feel unusually weak. You feel nauseous.. Get help right away if You have swelling in your arm, shoulder, neck, or face. You develop chest pain. You have difficulty breathing. You feel dizzy or light-headed. You have pus or a bad smell coming from your removal site You have a fever. You develop bleeding from your removal site, and your bleeding does not stop. This information is not intended to replace advice given to you by your health care provider. Make sure you discuss any questions you have with your health care provider. Document Released: 08/28/2012 Document Revised:  05/14/2016 Document Reviewed: 06/07/2015 Elsevier Interactive Patient Education  2017 Else[employer] Inc[employer] 

## 2023-09-05 NOTE — Op Note (Signed)
Operative Note     Preoperative diagnosis:   1. ESRD with functional permanent access  Postoperative diagnosis:  1. ESRD with functional permanent access  Procedure:  Removal of right jugular Permcath  Surgeon:  Festus Barren, MD  Anesthesia:  Local  EBL:  Minimal  Indication for the Procedure:  The patient has a functional permanent dialysis access and no longer needs their permcath.  This can be removed.  Risks and benefits are discussed and informed consent is obtained.  Description of the Procedure:  The patient's right neck, chest and existing catheter were sterilely prepped and draped. The area around the catheter was anesthetized copiously with 1% lidocaine. The catheter was dissected out with curved hemostats until the cuff was freed from the surrounding fibrous sheath. The fiber sheath was transected, and the catheter was then removed in its entirety using gentle traction. Pressure was held and sterile dressings were placed. The patient tolerated the procedure well and was taken to the recovery room in stable condition.     Festus Barren  09/05/2023, 2:09 PM This note was created with Dragon Medical transcription system. Any errors in dictation are purely unintentional.

## 2023-09-06 ENCOUNTER — Encounter: Payer: Self-pay | Admitting: Vascular Surgery

## 2023-11-16 ENCOUNTER — Telehealth (INDEPENDENT_AMBULATORY_CARE_PROVIDER_SITE_OTHER): Payer: Self-pay

## 2023-11-16 NOTE — Telephone Encounter (Signed)
Patient left a voicemail confirming her upcoming appt. I tried calling her back but didn't get an answer nor did she have a voicemail.  Appt: 11/23/23 @ 9:30 am ([place]) & 10:30 am Vivia Birmingham)

## 2023-11-16 NOTE — Telephone Encounter (Signed)
I called back and spoke with her husband. He said he will give the message for her appt on the 28th

## 2023-11-22 ENCOUNTER — Other Ambulatory Visit (INDEPENDENT_AMBULATORY_CARE_PROVIDER_SITE_OTHER): Payer: Self-pay | Admitting: Vascular Surgery

## 2023-11-22 DIAGNOSIS — N186 End stage renal disease: Secondary | ICD-10-CM

## 2023-11-23 ENCOUNTER — Ambulatory Visit (INDEPENDENT_AMBULATORY_CARE_PROVIDER_SITE_OTHER): Payer: Medicare Other | Admitting: Nurse Practitioner

## 2023-11-23 ENCOUNTER — Encounter (INDEPENDENT_AMBULATORY_CARE_PROVIDER_SITE_OTHER): Payer: Self-pay | Admitting: Nurse Practitioner

## 2023-11-23 ENCOUNTER — Ambulatory Visit (INDEPENDENT_AMBULATORY_CARE_PROVIDER_SITE_OTHER): Payer: Medicare Other

## 2023-11-23 VITALS — BP 192/92 | HR 83 | Resp 16 | Wt 130.0 lb

## 2023-11-23 DIAGNOSIS — N186 End stage renal disease: Secondary | ICD-10-CM | POA: Diagnosis not present

## 2023-11-23 DIAGNOSIS — E782 Mixed hyperlipidemia: Secondary | ICD-10-CM

## 2023-11-23 NOTE — Progress Notes (Signed)
 Subjective:    Patient ID: Cassandra [name], female    DOB: 1926/10/31, 89 y.o.   MRN: [id number] Chief Complaint  Patient presents with   Follow-up    Ref Lateef consult BFR low    The patient returns to the office for follow up regarding a problem with their dialysis access.   The patient notes a significant increase in problems with dialysis.  It is reported that adequate dialysis is not being achieved.    The patient denies hand pain or other symptoms consistent with steal phenomena.  No significant arm swelling.  The patient denies redness or swelling at the access site. The patient denies fever or chills at home or while on dialysis.  No recent shortening of the patient's walking distance or new symptoms consistent with claudication.  No history of rest pain symptoms. No new ulcers or wounds of the lower extremities have occurred.  The patient denies amaurosis fugax or recent TIA symptoms. There are no recent neurological changes noted. There is no history of DVT, PE or superficial thrombophlebitis. No recent episodes of angina or shortness of breath documented.   Duplex ultrasound of the AV access shows a patent access.  The previously noted stenosis is significantly increased compared to last study.  Flow volume today is 920 cc/min (previous flow volume was 1326 cc/min) the patient has hemodynamically significant velocity increases at the mid upper arm in the antecubital fossa    Review of Systems  Neurological:  Positive for weakness.  Hematological:  Bruises/bleeds easily.  All other systems reviewed and are negative.      Objective:   Physical Exam Vitals reviewed.  HENT:     Head: Normocephalic.  Cardiovascular:     Rate and Rhythm: Normal rate.     Arteriovenous access: Left arteriovenous access is present.    Comments: Soft thrill and bruit  Pulmonary:     Effort: Pulmonary effort is normal.  Skin:    General: Skin is warm and dry.  Neurological:      Mental Status: She is alert and oriented to person, place, and time.     Motor: Weakness present.     Gait: Gait abnormal.  Psychiatric:        Mood and Affect: Mood normal.        Behavior: Behavior normal.        Thought Content: Thought content normal.        Judgment: Judgment normal.     BP (!) 192/92   Pulse 83   Resp 16   Wt 130 lb (59 kg)   BMI 25.39 kg/m   Past Medical History:  Diagnosis Date   Anxiety    Aortic atherosclerosis (HCC)    Arthritis    osteoarthritis in hands, knees ( R torn cartilage, L meniscus removed)    Atherosclerosis of native coronary artery of native heart with unstable angina pectoris (HCC)    CAD (coronary artery disease) 1999   a.) s/p PCI (details unkown) in [place] in 1999   Campylobacter antigen positive 11/29/2014   a.) presented to [facility] ED with N/V/D; stool culture (+)   ESRD on hemodialysis (HCC)    a.) T-Th-Sat   Glaucoma    Hypercholesterolemia    Hyperlipidemia    Hypertension    Hypothyroidism    Lumbar stenosis with neurogenic claudication    Perforation of colon as colonoscopy complication (HCC) 2010   a.) required short term colostomy with subsequent take down/reversal  RLS (restless legs syndrome)    Thrombocytosis    Type 2 diabetes mellitus with chronic kidney disease (HCC)     Social History   Socioeconomic History   Marital status: Married    Spouse name: Chrissie Noa   Number of children: 3   Years of education: Not on file   Highest education level: Not on file  Occupational History   Not on file  Tobacco Use   Smoking status: Former    Current packs/day: 0.00    Types: Cigarettes    Quit date: 01/21/1980    Years since quitting: 43.8   Smokeless tobacco: Never  Vaping Use   Vaping status: Never Used  Substance and Sexual Activity   Alcohol use: Yes    Alcohol/week: 0.0 standard drinks of alcohol    Comment: occasionally   Drug use: No   Sexual activity: Never  Other Topics Concern   Not on  file  Social History Narrative   Not on file   Social Drivers of Health   Financial Resource Strain: Low Risk  (11/14/2023)   Received from [facility]   Overall Financial Resource Strain (CARDIA)    Difficulty of Paying Living Expenses: Not hard at all  Food Insecurity: No Food Insecurity (11/14/2023)   Received from [facility]   Hunger Vital Sign    Worried About Running Out of Food in the Last Year: Never true    Ran Out of Food in the Last Year: Never true  Transportation Needs: No Transportation Needs (11/14/2023)   Received from [facility] - Transportation    In the past 12 months, has lack of transportation kept you from medical appointments or from getting medications?: No    Lack of Transportation (Non-Medical): No  Physical Activity: Not on file  Stress: Not on file  Social Connections: Not on file  Intimate Partner Violence: Not At Risk (08/25/2023)   Humiliation, Afraid, Rape, and Kick questionnaire    Fear of Current or Ex-Partner: No    Emotionally Abused: No    Physically Abused: No    Sexually Abused: No    Past Surgical History:  Procedure Laterality Date   ABDOMINAL HYSTERECTOMY     left ovary removed   AV FISTULA PLACEMENT Left 05/25/2023   Procedure: ARTERIOVENOUS (AV) FISTULA CREATION (BRACHIALCEPHALIC);  Surgeon: Renford Dills, MD;  Location: [facility] ORS;  Service: Vascular;  Laterality: Left;   BLEPHAROPLASTY Bilateral    CATARACT EXTRACTION W/ INTRAOCULAR LENS  IMPLANT, BILATERAL     COLONOSCOPY  2010   COLOSTOMY  03/2009   perforated colon from colonoscopy   COLOSTOMY REVERSAL  08/2009   CORONARY ANGIOPLASTY WITH STENT PLACEMENT  1999   DIALYSIS/PERMA CATHETER INSERTION N/A 03/08/2023   Procedure: DIALYSIS/PERMA CATHETER INSERTION;  Surgeon: Annice Needy, MD;  Location: [facility] INVASIVE CV LAB;  Service: Cardiovascular;  Laterality: N/A;   DIALYSIS/PERMA CATHETER REMOVAL N/A 09/05/2023    Procedure: DIALYSIS/PERMA CATHETER REMOVAL;  Surgeon: Annice Needy, MD;  Location: [facility] INVASIVE CV LAB;  Service: Cardiovascular;  Laterality: N/A;   FOOT SURGERY Bilateral    toes correction   KNEE ARTHROSCOPY Right    torn cartilage   KNEE ARTHROSCOPY W/ MENISCECTOMY Left 01/2012   TEMPORARY DIALYSIS CATHETER N/A 03/05/2023   Procedure: TEMPORARY DIALYSIS CATHETER;  Surgeon: Annice Needy, MD;  Location: [facility] INVASIVE CV LAB;  Service: Cardiovascular;  Laterality: N/A;    Family History  Problem Relation  Age of Onset   Hypertension Mother    Hypertension Father    Early death Father    Heart disease Father     Allergies  Allergen Reactions   Atorvastatin Other (See Comments)    MYALGIA   Cyclobenzaprine Other (See Comments)    hallucination   Hydrocodone-Acetaminophen     Other reaction(s): Hallucination when taking whole pill, pt tolerates taking 1/2 pill   Mirtazapine     Other reaction(s): Hallucination   Oxycodone-Acetaminophen Other (See Comments)    hallucination   Paroxetine Hcl Other (See Comments)    hallucination   Penicillins Swelling    Lip and orbital swelling Tolerated 3rd generation cephalosporin (CEFTRIAXONE) between 03/05/2023 and 03/09/2023 with no documented ADRs.   Propoxyphene Other (See Comments)    hallucination   Trazodone Other (See Comments)    hallucination       Latest Ref Rng & Units 08/27/2023    4:08 AM 08/26/2023    9:49 AM 08/25/2023    5:49 AM  CBC  WBC 4.0 - 10.5 K/uL 12.1  14.4  19.7   Hemoglobin 12.0 - 15.0 g/dL 29.5  62.1  30.8   Hematocrit 36.0 - 46.0 % 35.8  34.2  31.0   Platelets 150 - 400 K/uL 524  460  364       CMP     Component Value Date/Time   NA 132 (L) 08/27/2023 0408   NA 137 12/07/2012 1641   K 4.5 08/27/2023 0408   K 4.2 12/07/2012 1641   CL 95 (L) 08/27/2023 0408   CL 106 12/07/2012 1641   CO2 26 08/27/2023 0408   CO2 27 12/07/2012 1641   GLUCOSE 86 08/27/2023 0408   GLUCOSE 75 12/07/2012 1641    BUN 49 (H) 08/27/2023 0408   BUN 18 12/07/2012 1641   CREATININE 6.71 (H) 08/27/2023 0408   CREATININE 1.13 12/07/2012 1641   CALCIUM 7.7 (L) 08/27/2023 0408   CALCIUM 8.9 12/07/2012 1641   PROT 8.0 08/24/2023 1332   PROT 8.6 (H) 12/07/2012 1641   ALBUMIN 3.2 (L) 08/24/2023 1332   ALBUMIN 3.2 (L) 12/07/2012 1641   AST 29 08/24/2023 1332   AST 37 12/07/2012 1641   ALT 17 08/24/2023 1332   ALT 33 12/07/2012 1641   ALKPHOS 98 08/24/2023 1332   ALKPHOS 72 12/07/2012 1641   BILITOT 0.9 08/24/2023 1332   BILITOT 0.4 12/07/2012 1641   GFRNONAA 5 (L) 08/27/2023 0408   GFRNONAA 45 (L) 12/07/2012 1641     No results found.     Assessment & Plan:   1. ESRD (end stage renal disease) (HCC) (Primary) Recommend:  The patient is experiencing increasing problems with their dialysis access.  Patient should have a fistulagram with the intention for intervention.  The intention for intervention is to restore appropriate flow and prevent thrombosis and possible loss of the access.  As well as improve the quality of dialysis therapy.  The risks, benefits and alternative therapies were reviewed in detail with the patient.  All questions were answered.  The patient agrees to proceed with angio/intervention.    The patient will follow up with me in the office after the procedure.   2. Hyperlipidemia, mixed Continue statin as ordered and reviewed, no changes at this time   Current Outpatient Medications on File Prior to Visit  Medication Sig Dispense Refill   ALPRAZolam (XANAX) 0.25 MG tablet Take 1 tablet (0.25 mg total) by mouth daily as needed. 10 tablet 0  amLODipine (NORVASC) 10 MG tablet Take 10 mg by mouth daily.     aspirin EC 81 MG EC tablet Take 1 tablet (81 mg total) by mouth daily. Swallow whole. 30 tablet 11   calcium acetate (PHOSLO) 667 MG capsule Take 1,334 mg by mouth 3 (three) times daily.     cetirizine (ZYRTEC) 10 MG tablet Take 1 tablet by mouth daily as needed for  allergies.     [employer] D3 25 MCG (1000 UT) capsule Take 1,000 Units by mouth daily.     DROXIA 300 MG capsule TAKE 1 CAPSULE (300 MG TOTAL) BY MOUTH DAILY. MAY TAKE WITH FOOD TO MINIMIZE GI SIDE EFFECTS. 30 capsule 0   gabapentin (NEURONTIN) 100 MG capsule Take 2 capsules (200 mg total) by mouth at bedtime. (Patient taking differently: Take 100-200 mg by mouth 3 (three) times daily. 100 mg am 100 mg afternoon 200 mg bedtime) 60 capsule 0   glucose blood test strip Use once daily. Use as instructed.     HYDROcodone-acetaminophen (NORCO/VICODIN) 5-325 MG tablet Take 1 tablet by mouth every 6 (six) hours as needed for moderate pain (pain score 4-6) or severe pain (pain score 7-10).     latanoprost (XALATAN) 0.005 % ophthalmic solution Place 1 drop into both eyes at bedtime.     levothyroxine (SYNTHROID) 88 MCG tablet Take 88 mcg by mouth daily. Take on an empty stomach 30 to 60 minutes before breakfast     multivitamin (RENA-VIT) TABS tablet Take 1 tablet by mouth at bedtime.  0   Nutritional Supplements (FEEDING SUPPLEMENT, NEPRO CARB STEADY,) LIQD Take 237 mLs by mouth 2 (two) times daily between meals.  0   rOPINIRole (REQUIP) 0.5 MG tablet Take 1 tablet (0.5 mg total) by mouth in the morning and at bedtime. 180 tablet 3   rosuvastatin (CRESTOR) 20 MG tablet Take 20 mg by mouth daily.     timolol (TIMOPTIC-XR) 0.5 % ophthalmic gel-forming Place 1 drop into both eyes every morning.     traMADol (ULTRAM) 50 MG tablet Take 1 tablet (50 mg total) by mouth every 6 (six) hours as needed. 26 tablet 0   No current facility-administered medications on file prior to visit.    There are no Patient Instructions on file for this visit. No follow-ups on file.   Georgiana Spinner, NP

## 2023-11-26 ENCOUNTER — Emergency Department
Admission: EM | Admit: 2023-11-26 | Discharge: 2023-11-26 | Disposition: A | Discharge status: Home / Self Care | Attending: Emergency Medicine | Admitting: Emergency Medicine

## 2023-11-26 ENCOUNTER — Emergency Department
Admission: EM | Admit: 2023-11-26 | Discharge: 2023-11-26 | Disposition: A | Discharge status: Home / Self Care | Source: Home / Self Care | Attending: Emergency Medicine | Admitting: Emergency Medicine

## 2023-11-26 ENCOUNTER — Other Ambulatory Visit: Payer: Self-pay

## 2023-11-26 DIAGNOSIS — I129 Hypertensive chronic kidney disease with stage 1 through stage 4 chronic kidney disease, or unspecified chronic kidney disease: Secondary | ICD-10-CM | POA: Insufficient documentation

## 2023-11-26 DIAGNOSIS — G2581 Restless legs syndrome: Secondary | ICD-10-CM | POA: Diagnosis not present

## 2023-11-26 DIAGNOSIS — G8929 Other chronic pain: Secondary | ICD-10-CM | POA: Diagnosis not present

## 2023-11-26 DIAGNOSIS — M62838 Other muscle spasm: Secondary | ICD-10-CM | POA: Diagnosis not present

## 2023-11-26 DIAGNOSIS — Z79899 Other long term (current) drug therapy: Secondary | ICD-10-CM | POA: Diagnosis not present

## 2023-11-26 DIAGNOSIS — Z992 Dependence on renal dialysis: Secondary | ICD-10-CM | POA: Insufficient documentation

## 2023-11-26 DIAGNOSIS — M546 Pain in thoracic spine: Secondary | ICD-10-CM | POA: Insufficient documentation

## 2023-11-26 DIAGNOSIS — E1122 Type 2 diabetes mellitus with diabetic chronic kidney disease: Secondary | ICD-10-CM | POA: Insufficient documentation

## 2023-11-26 DIAGNOSIS — N189 Chronic kidney disease, unspecified: Secondary | ICD-10-CM | POA: Insufficient documentation

## 2023-11-26 DIAGNOSIS — M62830 Muscle spasm of back: Secondary | ICD-10-CM | POA: Insufficient documentation

## 2023-11-26 LAB — CBC
HCT: 48.1 % — ABNORMAL HIGH (ref 36.0–46.0)
Hemoglobin: 15.3 g/dL — ABNORMAL HIGH (ref 12.0–15.0)
MCH: 32.6 pg (ref 26.0–34.0)
MCHC: 31.8 g/dL (ref 30.0–36.0)
MCV: 102.3 fL — ABNORMAL HIGH (ref 80.0–100.0)
Platelets: 513 10*3/uL — ABNORMAL HIGH (ref 150–400)
RBC: 4.7 MIL/uL (ref 3.87–5.11)
RDW: 16.1 % — ABNORMAL HIGH (ref 11.5–15.5)
WBC: 25 10*3/uL — ABNORMAL HIGH (ref 4.0–10.5)
nRBC: 0 % (ref 0.0–0.2)

## 2023-11-26 LAB — BASIC METABOLIC PANEL
Anion gap: 18 — ABNORMAL HIGH (ref 5–15)
BUN: 54 mg/dL — ABNORMAL HIGH (ref 8–23)
CO2: 24 mmol/L (ref 22–32)
Calcium: 7.2 mg/dL — ABNORMAL LOW (ref 8.9–10.3)
Chloride: 95 mmol/L — ABNORMAL LOW (ref 98–111)
Creatinine, Ser: 6.87 mg/dL — ABNORMAL HIGH (ref 0.44–1.00)
GFR, Estimated: 5 mL/min — ABNORMAL LOW (ref >60)
Glucose, Bld: 174 mg/dL — ABNORMAL HIGH (ref 70–99)
Potassium: 4.4 mmol/L (ref 3.5–5.1)
Sodium: 137 mmol/L (ref 135–145)

## 2023-11-26 MED ORDER — DROPERIDOL 2.5 MG/ML IJ SOLN
1.2500 mg | Freq: Once | INTRAMUSCULAR | Status: AC
  Administered 2023-11-26: 1.25 mg via INTRAMUSCULAR
  Filled 2023-11-26: qty 2

## 2023-11-26 MED ORDER — HYDROCODONE-ACETAMINOPHEN 5-325 MG PO TABS
1.0000 | ORAL_TABLET | Freq: Once | ORAL | Status: AC
  Administered 2023-11-26: 1 via ORAL
  Filled 2023-11-26: qty 1

## 2023-11-26 MED ORDER — DROPERIDOL 2.5 MG/ML IJ SOLN: 1.2500 mg | Freq: Once | INTRAMUSCULAR | Status: DC

## 2023-11-26 MED ORDER — TIZANIDINE HCL 2 MG PO TABS
2.0000 mg | ORAL_TABLET | Freq: Once | ORAL | Status: AC
  Administered 2023-11-26: 2 mg via ORAL
  Filled 2023-11-26: qty 1

## 2023-11-26 MED ORDER — TIZANIDINE HCL 2 MG PO TABS: 2.0000 mg | ORAL_TABLET | Freq: Three times a day (TID) | ORAL | 0 refills | Status: AC

## 2023-11-26 MED ORDER — CAPSAICIN 0.025 % EX CREA
TOPICAL_CREAM | Freq: Once | CUTANEOUS | Status: AC
  Filled 2023-11-26: qty 1

## 2023-11-26 NOTE — ED Provider Notes (Signed)
 [facility] Provider Note    Event Date/Time   First MD Initiated Contact with Patient 11/26/23 [phone]     (approximate)   History   Back Spasms  SOB   HPI Cassandra [name] is a 89 y.o. female with a long history of chronic back pain who sees Dr. Yves Dill for trigger point injections and management of her chronic back pain.  She presents tonight reporting severe and worse than usual back spasms.  She states that her last injections were about a week ago, and that she got relief "for about 10 hours".  Her pain is always worse at night; usually being up on her feet and walking around helps her feel better, but when she is ready for bed and trying to lie or even sit down, that make the back pain much worse.  She describes it as cramping and spasms in the upper middle part of her back, worse on the right but present on both sides beneath the shoulder blades.  She takes Tylenol and Norco regularly, and last had a Norco about 4 hours ago, but it has not helped.  She states that she needs to call Dr. Yves Dill for a follow up appointment to discuss whether to get an MRI.     Physical Exam   Triage Vital Signs: ED Triage Vitals  Encounter Vitals Group     BP 11/26/23 0332 (!) 136/111     Systolic BP Percentile --      Diastolic BP Percentile --      Pulse Rate 11/26/23 0332 87     Resp 11/26/23 0332 20     Temp 11/26/23 0332 98.2 F (36.8 C)     Temp Source 11/26/23 0332 Oral     SpO2 11/26/23 0332 96 %     Weight 11/26/23 0336 59 kg (130 lb)     Height 11/26/23 0336 1.524 m (5')     Head Circumference --      Peak Flow --      Pain Score 11/26/23 0336 9     Pain Loc --      Pain Education --      Exclude from Growth Chart --     Most recent vital signs: Vitals:   11/26/23 0430 11/26/23 0600  BP: (!) 152/86 (!) 150/67  Pulse: 85 70  Resp:  20  Temp:    SpO2: 91% 91%    General: Awake, very agitated and anxious. CV:  Good peripheral perfusion. Regular  rate and rhythm. Resp:  Normal effort. Speaking easily and comfortably, no accessory muscle usage nor intercostal retractions.  Lungs are clear to auscultation Abd:  No distention. No tenderness to palpation Other:  Patient has some scarring around her right shoulder blade that she says is the result of a sore she got while she was admitted to the hospital, and that is an area that hurts her frequently.  There are no other visible skin abnormalities including no vesicular lesions or erythema suggestive of a cellulitis or shingles.     ED Results / Procedures / Treatments   Labs (all labs ordered are listed, but only abnormal results are displayed) Labs Reviewed - No data to display     PROCEDURES:  Critical Care performed: No  Procedures    IMPRESSION / MDM / ASSESSMENT AND PLAN / ED COURSE  I reviewed the triage vital signs and the nursing notes.  Differential diagnosis includes, but is not limited to, muscle spasms, lumbar and/or thoracic stenosis and radiculopathy, anxiety, less likely osteomyelitis/discitis, transverse myelitis, PE.  Patient's presentation is most consistent with exacerbation of chronic illness.  Interventions/Medications given:  Medications  droperidol (INAPSINE) 2.5 MG/ML injection 1.25 mg (1.25 mg Intramuscular Given 11/26/23 0542)  HYDROcodone-acetaminophen (NORCO/VICODIN) 5-325 MG per tablet 1 tablet (1 tablet Oral Given 11/26/23 0541)    (Note:  hospital course my include additional interventions and/or labs/studies not listed above.)   I reviewed the medical record including the most recent office note written by Dr. Yves Dill from 11/21/2023.  He went into detail about her chronic back pain history, the injections that have tried, problems including hallucinations with various medications in the past including steroids and anything other than hydrocodone in terms of narcotics, etc.  I considered potentially emergent or  life-threatening condition such as PE in the differential, but the symptoms have been going on for a long time and both the patient and her husband report that they are worse at night when she is trying to rest.  She very clearly describes them as spasming in the middle to upper part of her thoracic spine, mostly on the right side but on both sides.  Her vital signs are generally reassuring and she is splinting because of the pain in her back but I strongly doubt PE.  She has been getting dialysis and is not due for another 24+ hours (she is on a Tuesday/Thursday/Saturday schedule).  I am going to try to control the pain she is experiencing first and then we will reassess the need for additional workup.  I considered multiple options but I am very reluctant to try a benzodiazepine such as Valium given the patient's age.  I think a very reasonable and beneficial course of action is to try droperidol 1.25 mg IV, which should help with the pain as well as acting as a calming agent.  I will then reassess and see what else we can do to address the issue.     Clinical Course as of 11/26/23 0614  Mon Nov 26, 2023  0524 Unfortunately the patient is very difficult in terms of vascular access.  Multiple nurses have tried multiple times and have been unable to establish an IV.  I will switch to IM administration to see if it gives the patient some relief, though of note, the nurses report that she did not seem to be spasming while they were attempting to place a line.  I will order droperidol 1.25 mg IM (I will go relatively low on the initial dose) and also give one Norco.  Plan to reassess. [CF]  [phone] Patient feels much better, awake and alert, no additional back pain or spasms, states she is ready go home.  Recommended close follow-up with Dr. Yves Dill and continuing her regular medications.  I gave my usual return precautions. [CF]    Clinical Course User Index [CF] Loleta Rose, MD     FINAL CLINICAL  IMPRESSION(S) / ED DIAGNOSES   Final diagnoses:  Chronic bilateral thoracic back pain  Night muscle spasms     Rx / DC Orders   ED Discharge Orders     None        Note:  This document was prepared using Dragon voice recognition software and may include unintentional dictation errors.   Loleta Rose, MD 11/26/23 [phone]

## 2023-11-26 NOTE — ED Triage Notes (Signed)
 Pt reports on-going SOB for a while d/t upper & mid back spasms, She has gone to the "walk-in clinic" several times for her back spasms. Pt reports when she tries to lay down her back goes to spasmsing which causes her SOB, st the pain is so bad that it takes her breath away. Pt reports she is being treated for nerve damage. Pt report has had Cortizone injections, that has not seem to help her pain. Husband reports during the day she is better as she is up and moving around and the spasms and SOB is worse every night to the pint she is not sleeping. Pt reports has tried several different pain regiment that does not seem to ease her back spasms. Pt reporting, "I can't take it anymore, I need this something to help ease this spasms. Pt able to speak in complete sentences, VSS, sats 96% RA.

## 2023-11-26 NOTE — ED Notes (Signed)
 See triage note  Presents with family with "spasms" States she was here early this am  and was seen and discharged home  States she was sent back from [facility]   Pt is moving all over stretcher    States she is currently on gabapentin for neuropathy  But states this is worse

## 2023-11-26 NOTE — ED Triage Notes (Signed)
 Pt is back in the ER for the same s/s that she came in with this AM that included back and leg spasms. Pt also had blood done on 03/02 with the same s/s.

## 2023-11-26 NOTE — Discharge Instructions (Addendum)
 Follow-up with Dr. Hyacinth Meeker as discussed.  Take the prescription muscle relaxant as needed.

## 2023-11-26 NOTE — Discharge Instructions (Signed)
 Please continue taking your regular medications as previously prescribed by your doctors.  Follow-up with Dr. Yves Dill at the next available opportunity.  Return to the emergency department if you develop new or worsening symptoms that concern you.

## 2023-11-26 NOTE — ED Provider Notes (Signed)
 [facility] Emergency Department Provider Note     Event Date/Time   First MD Initiated Contact with Patient 11/26/23 1203     (approximate)   History   Spasms   HPI  Cassandra [name] is a 89 y.o. female with a history of CKD on dialysis ([facility]), RLS, diabetes, HTN, obesity, glaucoma, who returns to the ED several hours after being discharged.  Patient presented less than 8 hours from her most recent discharge, endorsing ongoing spasms in the mid back.  She is also reporting an increase in her jumping legs.  Patient denies any recent injury or trauma.  She reports the previously provided injection did not affect her symptoms.  She denies any injury, cough, congestion, or chest pain.  No bladder or bowel incontinence is reported.  She presents for ongoing evaluation management of mid back pain that she describes as spasms.   Physical Exam   Triage Vital Signs: ED Triage Vitals  Encounter Vitals Group     BP 11/26/23 1123 (!) 161/85     Systolic BP Percentile --      Diastolic BP Percentile --      Pulse Rate 11/26/23 1123 98     Resp 11/26/23 1123 18     Temp 11/26/23 1123 97.9 F (36.6 C)     Temp Source 11/26/23 1123 Oral     SpO2 11/26/23 1123 90 %     Weight 11/26/23 1128 126 lb (57.2 kg)     Height 11/26/23 1128 5' (1.524 m)     Head Circumference --      Peak Flow --      Pain Score 11/26/23 1128 10     Pain Loc --      Pain Education --      Exclude from Growth Chart --     Most recent vital signs: Vitals:   11/26/23 1123  BP: (!) 161/85  Pulse: 98  Resp: 18  Temp: 97.9 F (36.6 C)  SpO2: 90%    General Awake, no distress. NAD HEENT NCAT. PERRL. EOMI. No rhinorrhea. Mucous membranes are moist.  CV:  Good peripheral perfusion. RRR RESP:  Normal effort. CTA ABD:  No distention.  MSK:  Chronic appearing hypertrophic scar to the right scapulothoracic region.  No vesicular skin changes noted.  No surrounding erythema, warmth,  induration noted.  Normal spinal alignment without midline tenderness, spasm, deformity, or step-off.  Patient transitions from sit to lying to sit without difficulty.  Active range of motion of all extremities on exam.  Intermittent large contractions of the lower legs noted. NEURO: Cranial nerves II to XII grossly intact.  Normal LE DTRs bilaterally.   ED Results / Procedures / Treatments   Labs (all labs ordered are listed, but only abnormal results are displayed) Labs Reviewed  CBC - Abnormal; Notable for the following components:      Result Value   WBC 25.0 (*)    Hemoglobin 15.3 (*)    HCT 48.1 (*)    MCV 102.3 (*)    RDW 16.1 (*)    Platelets 513 (*)    All other components within normal limits  BASIC METABOLIC PANEL - Abnormal; Notable for the following components:   Chloride 95 (*)    Glucose, Bld 174 (*)    BUN 54 (*)    Creatinine, Ser 6.87 (*)    Calcium 7.2 (*)    GFR, Estimated 5 (*)    Anion gap 18 (*)  All other components within normal limits     EKG   RADIOLOGY  No results found.   PROCEDURES:  Critical Care performed: No  Procedures   MEDICATIONS ORDERED IN ED: Medications  tiZANidine (ZANAFLEX) tablet 2 mg (2 mg Oral Given 11/26/23 1240)  capsaicin (ZOSTRIX) 0.025 % cream ( Topical Given 11/26/23 1241)     IMPRESSION / MDM / ASSESSMENT AND PLAN / ED COURSE  I reviewed the triage vital signs and the nursing notes.                              Differential diagnosis includes, but is not limited to, infection, electrolyte abnormality, neuropathic pain, radicular pain, myalgias, muscle spasm  Patient's presentation is most consistent with acute complicated illness / injury requiring diagnostic workup.  Patient's diagnosis is consistent with scapulothoracic muscle pain.  Patient endorsing some improvement of her symptoms after topical Seshan and tizanidine tablet is provided.  She seemed a little bit confused about the reason for her having  restless leg syndrome.  I advised that the treatment was for the muscle spasms when she was reporting acutely, not for her chronic RLS is being treated with Requip.  Patient will be discharged home with prescriptions for tizanidine and the dispense Station. Patient is to follow up with her PCP as discussed, as needed or otherwise directed. Patient is given ED precautions to return to the ED for any worsening or new symptoms.   FINAL CLINICAL IMPRESSION(S) / ED DIAGNOSES   Final diagnoses:  Spasm of thoracic back muscle     Rx / DC Orders   ED Discharge Orders          Ordered    tiZANidine (ZANAFLEX) 2 MG tablet  3 times daily        11/26/23 1320             Note:  This document was prepared using Dragon voice recognition software and may include unintentional dictation errors.    Lissa Hoard, PA-C 11/27/23 Windell Moment    Merwyn Katos, MD 11/27/23 [phone]

## 2023-12-13 ENCOUNTER — Telehealth (INDEPENDENT_AMBULATORY_CARE_PROVIDER_SITE_OTHER): Payer: Self-pay

## 2023-12-13 NOTE — Telephone Encounter (Signed)
 Patient's arrival time to the [facility] has changed from 12:00 pm to 2:00 pm. Patient's spouse was given the information and understood.

## 2023-12-13 NOTE — Telephone Encounter (Signed)
 Spoke with the patient's spouse as the patient is at dialysis. The spouse stated they would call back when she got home from dialysis to schedule her left arm fistulagram procedure with Dr. Gilda Crease.

## 2023-12-13 NOTE — Telephone Encounter (Signed)
 Patient called back and is now scheduled with Dr. Gilda Crease on 12/18/23 with a 12:00 pm arrival time to the [facility]. Pre-procedure instructions were discussed and she stated she was writing them down. Patient does not have Mychart.

## 2023-12-18 ENCOUNTER — Encounter
Admission: RE | Disposition: A | Discharge status: Home / Self Care | Payer: Self-pay | Source: Home / Self Care | Attending: Vascular Surgery

## 2023-12-18 ENCOUNTER — Ambulatory Visit
Admission: RE | Admit: 2023-12-18 | Discharge: 2023-12-18 | Disposition: A | Discharge status: Home / Self Care | Attending: Vascular Surgery | Admitting: Vascular Surgery

## 2023-12-18 DIAGNOSIS — Z538 Procedure and treatment not carried out for other reasons: Secondary | ICD-10-CM | POA: Diagnosis not present

## 2023-12-18 DIAGNOSIS — N186 End stage renal disease: Secondary | ICD-10-CM | POA: Diagnosis present

## 2023-12-18 SURGERY — A/V FISTULAGRAM
Anesthesia: Moderate Sedation | Laterality: Left

## 2023-12-18 MED ORDER — ONDANSETRON HCL 4 MG/2ML IJ SOLN: 4.0000 mg | Freq: Four times a day (QID) | INTRAMUSCULAR | Status: DC | PRN

## 2023-12-18 MED ORDER — CEFAZOLIN SODIUM-DEXTROSE 1-4 GM/50ML-% IV SOLN: 1.0000 g | INTRAVENOUS | Status: DC

## 2023-12-18 MED ORDER — HYDROMORPHONE HCL 1 MG/ML IJ SOLN: 0.5000 mg | Freq: Once | INTRAMUSCULAR | Status: DC | PRN

## 2023-12-18 MED ORDER — METHYLPREDNISOLONE SODIUM SUCC 125 MG IJ SOLR
125.0000 mg | Freq: Once | INTRAMUSCULAR | Status: DC | PRN
Start: 2023-12-18 — End: 2023-12-20

## 2023-12-18 MED ORDER — FAMOTIDINE 20 MG PO TABS: 40.0000 mg | ORAL_TABLET | Freq: Once | ORAL | Status: DC | PRN

## 2023-12-18 MED ORDER — MIDAZOLAM HCL 2 MG/ML PO SYRP: 8.0000 mg | ORAL_SOLUTION | Freq: Once | ORAL | Status: DC | PRN

## 2023-12-18 MED ORDER — SODIUM CHLORIDE 0.9 % IV SOLN: INTRAVENOUS | Status: DC

## 2023-12-18 MED ORDER — CEFAZOLIN SODIUM-DEXTROSE 1-4 GM/50ML-% IV SOLN
INTRAVENOUS | Status: AC
  Filled 2023-12-18: qty 50

## 2023-12-18 MED ORDER — DIPHENHYDRAMINE HCL 50 MG/ML IJ SOLN
50.0000 mg | Freq: Once | INTRAMUSCULAR | Status: DC | PRN
Start: 2023-12-18 — End: 2023-12-20

## 2023-12-18 NOTE — Progress Notes (Signed)
 Ms Zogg to [facility] for procedure. Stated she ate at 11:00am today. Reported to MD with orders to reschedule procedure. Educated patient to be NPO after midnight when rescheduled. Notified [facility] to reach out to patient to reschedule.

## 2023-12-20 ENCOUNTER — Telehealth (INDEPENDENT_AMBULATORY_CARE_PROVIDER_SITE_OTHER): Payer: Self-pay

## 2023-12-20 NOTE — Telephone Encounter (Signed)
 Spoke with [facility] [address] and the patient has been rescheduled to have her left arm fistulagram on 12/25/23 with a 1:00 pm arrival time to the [facility]. Pre-procedure instructions will be sent to [facility] per request.

## 2023-12-25 ENCOUNTER — Other Ambulatory Visit: Payer: Self-pay

## 2023-12-25 ENCOUNTER — Encounter
Admission: RE | Disposition: A | Discharge status: Home / Self Care | Payer: Self-pay | Source: Home / Self Care | Attending: Vascular Surgery

## 2023-12-25 ENCOUNTER — Ambulatory Visit
Admission: RE | Admit: 2023-12-25 | Discharge: 2023-12-25 | Disposition: A | Discharge status: Home / Self Care | Attending: Vascular Surgery | Admitting: Vascular Surgery

## 2023-12-25 ENCOUNTER — Encounter: Payer: Self-pay | Admitting: Vascular Surgery

## 2023-12-25 DIAGNOSIS — Y832 Surgical operation with anastomosis, bypass or graft as the cause of abnormal reaction of the patient, or of later complication, without mention of misadventure at the time of the procedure: Secondary | ICD-10-CM | POA: Diagnosis not present

## 2023-12-25 DIAGNOSIS — I120 Hypertensive chronic kidney disease with stage 5 chronic kidney disease or end stage renal disease: Secondary | ICD-10-CM | POA: Diagnosis not present

## 2023-12-25 DIAGNOSIS — Z992 Dependence on renal dialysis: Secondary | ICD-10-CM | POA: Insufficient documentation

## 2023-12-25 DIAGNOSIS — T82858A Stenosis of vascular prosthetic devices, implants and grafts, initial encounter: Secondary | ICD-10-CM | POA: Diagnosis present

## 2023-12-25 DIAGNOSIS — Z87891 Personal history of nicotine dependence: Secondary | ICD-10-CM | POA: Insufficient documentation

## 2023-12-25 DIAGNOSIS — N186 End stage renal disease: Secondary | ICD-10-CM | POA: Insufficient documentation

## 2023-12-25 DIAGNOSIS — E1122 Type 2 diabetes mellitus with diabetic chronic kidney disease: Secondary | ICD-10-CM | POA: Diagnosis not present

## 2023-12-25 HISTORY — PX: A/V FISTULAGRAM: CATH118298

## 2023-12-25 LAB — POTASSIUM (ARMC VASCULAR LAB ONLY): Potassium (ARMC vascular lab): 3.7 mmol/L (ref 3.5–5.1)

## 2023-12-25 SURGERY — A/V FISTULAGRAM
Anesthesia: Moderate Sedation | Laterality: Left

## 2023-12-25 MED ORDER — MIDAZOLAM HCL 2 MG/ML PO SYRP: 8.0000 mg | ORAL_SOLUTION | Freq: Once | ORAL | Status: DC | PRN

## 2023-12-25 MED ORDER — SODIUM CHLORIDE 0.9 % IV SOLN: INTRAVENOUS | Status: DC

## 2023-12-25 MED ORDER — FAMOTIDINE 20 MG PO TABS: 40.0000 mg | ORAL_TABLET | Freq: Once | ORAL | Status: DC | PRN

## 2023-12-25 MED ORDER — LIDOCAINE HCL (PF) 1 % IJ SOLN
INTRAMUSCULAR | Status: DC | PRN
  Administered 2023-12-25: 5 mL

## 2023-12-25 MED ORDER — MIDAZOLAM HCL 2 MG/2ML IJ SOLN
INTRAMUSCULAR | Status: DC | PRN
  Administered 2023-12-25: 1 mg via INTRAVENOUS
  Administered 2023-12-25: .5 mg via INTRAVENOUS

## 2023-12-25 MED ORDER — ONDANSETRON HCL 4 MG/2ML IJ SOLN: 4.0000 mg | Freq: Four times a day (QID) | INTRAMUSCULAR | Status: DC | PRN

## 2023-12-25 MED ORDER — METHYLPREDNISOLONE SODIUM SUCC 125 MG IJ SOLR
125.0000 mg | Freq: Once | INTRAMUSCULAR | Status: DC | PRN
Start: 2023-12-25 — End: 2023-12-25

## 2023-12-25 MED ORDER — HEPARIN SODIUM (PORCINE) 1000 UNIT/ML IJ SOLN
INTRAMUSCULAR | Status: AC
  Filled 2023-12-25: qty 10

## 2023-12-25 MED ORDER — HYDROMORPHONE HCL 1 MG/ML IJ SOLN: 1.0000 mg | Freq: Once | INTRAMUSCULAR | Status: DC | PRN

## 2023-12-25 MED ORDER — HEPARIN SODIUM (PORCINE) 1000 UNIT/ML IJ SOLN
INTRAMUSCULAR | Status: DC | PRN
  Administered 2023-12-25: 4000 [IU] via INTRAVENOUS

## 2023-12-25 MED ORDER — HEPARIN (PORCINE) IN NACL 1000-0.9 UT/500ML-% IV SOLN
INTRAVENOUS | Status: DC | PRN
  Administered 2023-12-25: 500 mL

## 2023-12-25 MED ORDER — FENTANYL CITRATE (PF) 100 MCG/2ML IJ SOLN
INTRAMUSCULAR | Status: AC
  Filled 2023-12-25: qty 2

## 2023-12-25 MED ORDER — MIDAZOLAM HCL 5 MG/5ML IJ SOLN
INTRAMUSCULAR | Status: AC
Start: 2023-12-25 — End: ?
  Filled 2023-12-25: qty 5

## 2023-12-25 MED ORDER — DIPHENHYDRAMINE HCL 50 MG/ML IJ SOLN: 50.0000 mg | Freq: Once | INTRAMUSCULAR | Status: DC | PRN

## 2023-12-25 MED ORDER — IODIXANOL 320 MG/ML IV SOLN
INTRAVENOUS | Status: DC | PRN
  Administered 2023-12-25: 40 mL

## 2023-12-25 MED ORDER — FENTANYL CITRATE (PF) 100 MCG/2ML IJ SOLN
INTRAMUSCULAR | Status: DC | PRN
  Administered 2023-12-25 (×2): 25 ug via INTRAVENOUS

## 2023-12-25 MED ORDER — CEFAZOLIN SODIUM-DEXTROSE 1-4 GM/50ML-% IV SOLN
1.0000 g | INTRAVENOUS | Status: AC
  Administered 2023-12-25: 1 g via INTRAVENOUS

## 2023-12-25 MED ORDER — CEFAZOLIN SODIUM-DEXTROSE 1-4 GM/50ML-% IV SOLN
INTRAVENOUS | Status: AC
  Filled 2023-12-25: qty 50

## 2023-12-25 SURGICAL SUPPLY — 20 items
BALLN DORADO 10X40X80 (BALLOONS) ×1 IMPLANT
BALLN LUTONIX AV 8X40X75 (BALLOONS) ×1 IMPLANT
BALLN LUTONIX DCB 6X40X130 (BALLOONS) ×1 IMPLANT
BALLOON DORADO 10X40X80 (BALLOONS) IMPLANT
BALLOON LUTONIX AV 8X40X75 (BALLOONS) IMPLANT
BALLOON LUTONIX DCB 6X40X130 (BALLOONS) IMPLANT
CATH BEACON 5 .035 40 KMP TP (CATHETERS) IMPLANT
COVER PROBE ULTRASOUND 5X96 (MISCELLANEOUS) IMPLANT
DEVICE PRESTO INFLATION (MISCELLANEOUS) IMPLANT
DEVICE TORQUE (MISCELLANEOUS) IMPLANT
DRAPE BRACHIAL (DRAPES) IMPLANT
GOWN STRL REUS W/ TWL LRG LVL3 (GOWN DISPOSABLE) ×1 IMPLANT
GUIDEWIRE ANGLED .035 180CM (WIRE) IMPLANT
NDL ENTRY 21GA 7CM ECHOTIP (NEEDLE) IMPLANT
NEEDLE ENTRY 21GA 7CM ECHOTIP (NEEDLE) ×1 IMPLANT
PACK ANGIOGRAPHY (CUSTOM PROCEDURE TRAY) ×1 IMPLANT
SET INTRO CAPELLA COAXIAL (SET/KITS/TRAYS/PACK) IMPLANT
SHEATH BRITE TIP 6FRX5.5 (SHEATH) IMPLANT
SUT MNCRL AB 4-0 PS2 18 (SUTURE) IMPLANT
WIRE SUPRACORE 190CM (WIRE) IMPLANT

## 2023-12-25 NOTE — Interval H&P Note (Signed)
 History and Physical Interval Note:  12/25/2023 2:37 PM  Cassandra [name]  has presented today for surgery, with the diagnosis of L arm fistulagram   End Stage Renal.  The various methods of treatment have been discussed with the patient and family. After consideration of risks, benefits and other options for treatment, the patient has consented to  Procedure(s): A/V Fistulagram (Left) as a surgical intervention.  The patient's history has been reviewed, patient examined, no change in status, stable for surgery.  I have reviewed the patient's chart and labs.  Questions were answered to the patient's satisfaction.     Levora Dredge

## 2023-12-25 NOTE — Op Note (Signed)
 OPERATIVE NOTE   PROCEDURE: Contrast injection left brachiocephalic AV access Percutaneous transluminal angioplasty to 10 mm left brachiocephalic fistula peripheral segment  PRE-OPERATIVE DIAGNOSIS: Complication of dialysis access                                                       End Stage Renal Disease  POST-OPERATIVE DIAGNOSIS: same as above   SURGEON: Renford Dills, M.D.  ANESTHESIA: Conscious sedation was administered under my direct supervision by the interventional radiology RN. IV Versed plus fentanyl were utilized. Continuous ECG, pulse oximetry and blood pressure was monitored throughout the entire procedure.  Conscious sedation was for a total of 55 minutes.  ESTIMATED BLOOD LOSS: minimal  FINDING(S): Stricture of the AV graft  SPECIMEN(S):  None  CONTRAST: 20 cc  FLUOROSCOPY TIME: 4.5 minutes  INDICATIONS: Cassandra [name] is a 89 y.o. female who  presents with malfunctioning left arm AV access.  The patient is scheduled for angiography with possible intervention of the AV access.  The patient is aware the risks include but are not limited to: bleeding, infection, thrombosis of the cannulated access, and possible anaphylactic reaction to the contrast.  The patient acknowledges if the access can not be salvaged a tunneled catheter will be needed and will be placed during this procedure.  The patient is aware of the risks of the procedure and elects to proceed with the angiogram and intervention.  DESCRIPTION: After full informed written consent was obtained, the patient was brought back to the Special Procedure suite and placed supine position.  Appropriate cardiopulmonary monitors were placed.  The left arm was prepped and draped in the standard fashion.  Appropriate timeout is called. The left brachiocephalic fistula was initially cannulated in the antegrade direction just above the arterial anastomosis.  However, the stricture was so tight that I was unable to get  the wire negotiated through so that I could place the sheath.  Therefore the microneedle and wire were pulled out and pressure was held and the brachiocephalic fistula was cannulated with a micropuncture needle at the level of the deltoid muscle in a retrograde direction.  Cannulation was performed with ultrasound guidance without difficulty. Ultrasound was placed in a sterile sleeve, the AV access was interrogated and noted to be echolucent and compressible indicating patency. Image was recorded for the permanent record. The puncture is performed under continuous ultrasound visualization.   The microwire was advanced and the needle was exchanged for  a microsheath.  The J-wire was then advanced and a 6 Fr sheath inserted.  A floppy Glidewire was then negotiated into the more proximal brachial artery under fluoroscopic guidance followed by a Kumpe catheter.  Hand injections were completed through the Kumpe catheter to image the access from the arterial anastomosis through the entire access.  The central venous structures were also imaged by hand injections.  Based on the images,  4000 units of heparin was given and a wire was negotiated through the stricture within the peripheral portion of the graft.  Initially a 6 mm x 40 mm Lutonix balloon was used to treat the stricture.  Next a 8 mm x 40 mm Lutonix balloon was inflated across the stricture and finally a 10 mm x 40 mm Dorado balloon was advanced across the lesion, inflation was to 10 atm for 1 minute.  Follow-up imaging demonstrates less than 15% residual stenosis with rapid flow of contrast through the fistula, the central venous anatomy was imaged and is preserved.  A 4-0 Monocryl purse-string suture was sewn around the sheath.  The sheath was removed and light pressure was applied.  A sterile bandage was applied to the puncture site.  Interpretation: The visualized portions of the brachial artery are widely patent.  The actual anastomosis is widely  patent.  There is a 2 to 3 cm segment of the cephalic vein which measures 10 to 14 mm in diameter then abruptly the vein narrows down to approximately 2 mm.  This is a short segment in the cephalic vein then enlarges back to approximately 10 mm in the area of the upper arm that would be cannulated.  Central veins appear patent.  There are 2 places in the cephalic vein more proximally 1 is at the level of the shoulder and 1 at the confluence with the subclavian vein where there appears to be moderate stenosis.  Following serial angioplasty up to a maximum of 10 mm there is now a dramatic improvement with less than 15% residual stenosis at the site of the stricture and the flow dynamics of the AV access are markedly improved.  COMPLICATIONS: None  CONDITION: Almon Register, M.D [facility] Office: [phone]  12/25/2023 3:45 PM

## 2023-12-25 NOTE — Progress Notes (Signed)
 MRN : [id number]  Cassandra [name] is a 89 y.o. (Mar 22, 1927) female who presents with chief complaint of check access.  History of Present Illness:   Patient presents to [facility] for evaluation of her AV access.  She has been sent by her dialysis center who has requested the evaluation secondary to difficulty with cannulation.  The patient notes a significant increase in problems with dialysis.  It is reported that adequate dialysis is not being achieved.    The patient denies hand pain or other symptoms consistent with steal phenomena.  No significant arm swelling.  The patient denies redness or swelling at the access site. The patient denies fever or chills at home or while on dialysis.  No recent history of amaurosis fugax or recent TIA symptoms. There are no recent neurological changes noted. There is no history of DVT, PE or superficial thrombophlebitis. No recent episodes of angina or shortness of breath documented.    Current Meds  Medication Sig   ALPRAZolam (XANAX) 0.25 MG tablet Take 1 tablet (0.25 mg total) by mouth daily as needed.   amLODipine (NORVASC) 10 MG tablet Take 10 mg by mouth daily.   aspirin EC 81 MG EC tablet Take 1 tablet (81 mg total) by mouth daily. Swallow whole.   calcium acetate (PHOSLO) 667 MG capsule Take 1,334 mg by mouth 3 (three) times daily.   cetirizine (ZYRTEC) 10 MG tablet Take 1 tablet by mouth daily as needed for allergies.   [employer] D3 25 MCG (1000 UT) capsule Take 1,000 Units by mouth daily.   DROXIA 300 MG capsule TAKE 1 CAPSULE (300 MG TOTAL) BY MOUTH DAILY. MAY TAKE WITH FOOD TO MINIMIZE GI SIDE EFFECTS.   gabapentin (NEURONTIN) 100 MG capsule Take 2 capsules (200 mg total) by mouth at bedtime. (Patient taking differently: Take 100-200 mg by mouth 3 (three) times daily. 100 mg am 100 mg afternoon 200 mg bedtime)   latanoprost (XALATAN) 0.005 % ophthalmic solution Place 1 drop into both eyes at  bedtime.   rOPINIRole (REQUIP) 0.5 MG tablet Take 1 tablet (0.5 mg total) by mouth in the morning and at bedtime.   rosuvastatin (CRESTOR) 20 MG tablet Take 20 mg by mouth daily.   timolol (TIMOPTIC-XR) 0.5 % ophthalmic gel-forming Place 1 drop into both eyes every morning.    Past Medical History:  Diagnosis Date   Anxiety    Aortic atherosclerosis (HCC)    Arthritis    osteoarthritis in hands, knees ( R torn cartilage, L meniscus removed)    Atherosclerosis of native coronary artery of native heart with unstable angina pectoris (HCC)    CAD (coronary artery disease) 1999   a.) s/p PCI (details unkown) in [place] in 1999   Campylobacter antigen positive 11/29/2014   a.) [facility] ED with N/V/D; stool culture (+)   ESRD on hemodialysis (HCC)    a.) T-Th-Sat   Glaucoma    Hypercholesterolemia    Hyperlipidemia    Hypertension    Hypothyroidism    Lumbar stenosis with neurogenic claudication    Perforation of colon as colonoscopy complication (HCC) 2010   a.) required short term colostomy with subsequent take down/reversal   RLS (restless legs syndrome)    Thrombocytosis    Type 2 diabetes mellitus with chronic kidney disease (HCC)     Past Surgical History:  Procedure Laterality  Date   ABDOMINAL HYSTERECTOMY     left ovary removed   AV FISTULA PLACEMENT Left 05/25/2023   Procedure: ARTERIOVENOUS (AV) FISTULA CREATION (BRACHIALCEPHALIC);  Surgeon: Renford Dills, MD;  Location: [facility] ORS;  Service: Vascular;  Laterality: Left;   BLEPHAROPLASTY Bilateral    CATARACT EXTRACTION W/ INTRAOCULAR LENS  IMPLANT, BILATERAL     COLONOSCOPY  2010   COLOSTOMY  03/2009   perforated colon from colonoscopy   COLOSTOMY REVERSAL  08/2009   CORONARY ANGIOPLASTY WITH STENT PLACEMENT  1999   DIALYSIS/PERMA CATHETER INSERTION N/A 03/08/2023   Procedure: DIALYSIS/PERMA CATHETER INSERTION;  Surgeon: Annice Needy, MD;  Location: [facility] INVASIVE CV LAB;  Service: Cardiovascular;   Laterality: N/A;   DIALYSIS/PERMA CATHETER REMOVAL N/A 09/05/2023   Procedure: DIALYSIS/PERMA CATHETER REMOVAL;  Surgeon: Annice Needy, MD;  Location: [facility] INVASIVE CV LAB;  Service: Cardiovascular;  Laterality: N/A;   FOOT SURGERY Bilateral    toes correction   KNEE ARTHROSCOPY Right    torn cartilage   KNEE ARTHROSCOPY W/ MENISCECTOMY Left 01/2012   TEMPORARY DIALYSIS CATHETER N/A 03/05/2023   Procedure: TEMPORARY DIALYSIS CATHETER;  Surgeon: Annice Needy, MD;  Location: [facility] INVASIVE CV LAB;  Service: Cardiovascular;  Laterality: N/A;    Social History Social History   Tobacco Use   Smoking status: Former    Current packs/day: 0.00    Types: Cigarettes    Quit date: 01/21/1980    Years since quitting: 43.9   Smokeless tobacco: Never  Vaping Use   Vaping status: Never Used  Substance Use Topics   Alcohol use: Yes    Alcohol/week: 0.0 standard drinks of alcohol    Comment: occasionally   Drug use: No    Family History Family History  Problem Relation Age of Onset   Hypertension Mother    Hypertension Father    Early death Father    Heart disease Father     Allergies  Allergen Reactions   Atorvastatin Other (See Comments)    MYALGIA   Cyclobenzaprine Other (See Comments)    hallucination   Hydrocodone-Acetaminophen     Other reaction(s): Hallucination when taking whole pill, pt tolerates taking 1/2 pill   Mirtazapine     Other reaction(s): Hallucination   Oxycodone-Acetaminophen Other (See Comments)    hallucination   Paroxetine Hcl Other (See Comments)    hallucination   Penicillins Swelling    Lip and orbital swelling Tolerated 3rd generation cephalosporin (CEFTRIAXONE) between 03/05/2023 and 03/09/2023 with no documented ADRs.   Propoxyphene Other (See Comments)    hallucination   Trazodone Other (See Comments)    hallucination     REVIEW OF SYSTEMS (Negative unless checked)  Constitutional: [] Weight loss  [] Fever  [] Chills Cardiac: [] Chest pain    [] Chest pressure   [] Palpitations   [] Shortness of breath when laying flat   [] Shortness of breath with exertion. Vascular:  [] Pain in legs with walking   [] Pain in legs at rest  [] History of DVT   [] Phlebitis   [] Swelling in legs   [] Varicose veins   [] Non-healing ulcers Pulmonary:   [] Uses home oxygen   [] Productive cough   [] Hemoptysis   [] Wheeze  [] COPD   [] Asthma Neurologic:  [] Dizziness   [] Seizures   [] History of stroke   [] History of TIA  [] Aphasia   [] Vissual changes   [] Weakness or numbness in arm   [] Weakness or numbness in leg Musculoskeletal:   [] Joint swelling   [] Joint pain   [] Low back pain  Hematologic:  [] Easy bruising  [] Easy bleeding   [] Hypercoagulable state   [] Anemic Gastrointestinal:  [] Diarrhea   [] Vomiting  [] Gastroesophageal reflux/heartburn   [] Difficulty swallowing. Genitourinary:  [x] Chronic kidney disease   [] Difficult urination  [] Frequent urination   [] Blood in urine Skin:  [] Rashes   [] Ulcers  Psychological:  [] History of anxiety   []  History of major depression.  Physical Examination  Vitals:   12/25/23 1311  BP: (!) 178/66  Pulse: 78  Resp: 20  Temp: 97.7 F (36.5 C)  TempSrc: Oral  SpO2: 91%  Weight: 57.6 kg  Height: 5' (1.524 m)   Body mass index is 24.8 kg/m. Gen: WD/WN, NAD Head: [place]/AT, No temporalis wasting.  Ear/Nose/Throat: Hearing grossly intact, nares w/o erythema or drainage Eyes: PER, EOMI, sclera nonicteric.  Neck: Supple, no gross masses or lesions.  No JVD.  Pulmonary:  Good air movement, no audible wheezing, no use of accessory muscles.  Cardiac: RRR, precordium non-hyperdynamic. Vascular:   Poor thrill poor bruit Vessel Right Left  Radial Palpable Palpable  Brachial Palpable Palpable  Gastrointestinal: soft, non-distended. No guarding/no peritoneal signs.  Musculoskeletal: M/S 5/5 throughout.  No deformity.  Neurologic: CN 2-12 intact. Pain and light touch intact in extremities.  Symmetrical.  Speech is fluent. Motor exam as  listed above. Psychiatric: Judgment intact, Mood & affect appropriate for pt's clinical situation. Dermatologic: No rashes or ulcers noted.  No changes consistent with cellulitis.   CBC Lab Results  Component Value Date   WBC 25.0 (H) 11/26/2023   HGB 15.3 (H) 11/26/2023   HCT 48.1 (H) 11/26/2023   MCV 102.3 (H) 11/26/2023   PLT 513 (H) 11/26/2023    BMET    Component Value Date/Time   NA 137 11/26/2023 1129   NA 137 12/07/2012 1641   K 4.4 11/26/2023 1129   K 4.2 12/07/2012 1641   CL 95 (L) 11/26/2023 1129   CL 106 12/07/2012 1641   CO2 24 11/26/2023 1129   CO2 27 12/07/2012 1641   GLUCOSE 174 (H) 11/26/2023 1129   GLUCOSE 75 12/07/2012 1641   BUN 54 (H) 11/26/2023 1129   BUN 18 12/07/2012 1641   CREATININE 6.87 (H) 11/26/2023 1129   CREATININE 1.13 12/07/2012 1641   CALCIUM 7.2 (L) 11/26/2023 1129   CALCIUM 8.9 12/07/2012 1641   GFRNONAA 5 (L) 11/26/2023 1129   GFRNONAA 45 (L) 12/07/2012 1641   GFRAA 25 (L) 03/30/2020 1026   GFRAA 52 (L) 12/07/2012 1641   CrCl cannot be calculated (Patient's most recent lab result is older than the maximum 21 days allowed.).  COAG Lab Results  Component Value Date   INR 1.1 12/10/2021   INR 1.0 10/12/2021   INR 1.1 05/15/2021    Radiology No results found.   Assessment/Plan  End stage renal disease: Recommend:  The patient is experiencing increasing problems with their dialysis access.  Patient should have a fistulagram with the intention for intervention.  The intention for intervention is to restore appropriate flow and prevent thrombosis and possible loss of the access.  As well as improve the quality of dialysis therapy.  The risks, benefits and alternative therapies were reviewed in detail with the patient.  All questions were answered.  The patient agrees to proceed with angio/intervention.    The patient will follow up with me in the office after the procedure.    Levora Dredge, MD  12/25/2023 2:35  PM

## 2023-12-25 NOTE — H&P (View-Only) (Signed)
 MRN : [id number]  Cassandra [name] is a 89 y.o. (Mar 22, 1927) female who presents with chief complaint of check access.  History of Present Illness:   Patient presents to [facility] for evaluation of her AV access.  She has been sent by her dialysis center who has requested the evaluation secondary to difficulty with cannulation.  The patient notes a significant increase in problems with dialysis.  It is reported that adequate dialysis is not being achieved.    The patient denies hand pain or other symptoms consistent with steal phenomena.  No significant arm swelling.  The patient denies redness or swelling at the access site. The patient denies fever or chills at home or while on dialysis.  No recent history of amaurosis fugax or recent TIA symptoms. There are no recent neurological changes noted. There is no history of DVT, PE or superficial thrombophlebitis. No recent episodes of angina or shortness of breath documented.    Current Meds  Medication Sig   ALPRAZolam (XANAX) 0.25 MG tablet Take 1 tablet (0.25 mg total) by mouth daily as needed.   amLODipine (NORVASC) 10 MG tablet Take 10 mg by mouth daily.   aspirin EC 81 MG EC tablet Take 1 tablet (81 mg total) by mouth daily. Swallow whole.   calcium acetate (PHOSLO) 667 MG capsule Take 1,334 mg by mouth 3 (three) times daily.   cetirizine (ZYRTEC) 10 MG tablet Take 1 tablet by mouth daily as needed for allergies.   [employer]S D3 25 MCG (1000 UT) capsule Take 1,000 Units by mouth daily.   DROXIA 300 MG capsule TAKE 1 CAPSULE (300 MG TOTAL) BY MOUTH DAILY. MAY TAKE WITH FOOD TO MINIMIZE GI SIDE EFFECTS.   gabapentin (NEURONTIN) 100 MG capsule Take 2 capsules (200 mg total) by mouth at bedtime. (Patient taking differently: Take 100-200 mg by mouth 3 (three) times daily. 100 mg am 100 mg afternoon 200 mg bedtime)   latanoprost (XALATAN) 0.005 % ophthalmic solution Place 1 drop into both eyes at  bedtime.   rOPINIRole (REQUIP) 0.5 MG tablet Take 1 tablet (0.5 mg total) by mouth in the morning and at bedtime.   rosuvastatin (CRESTOR) 20 MG tablet Take 20 mg by mouth daily.   timolol (TIMOPTIC-XR) 0.5 % ophthalmic gel-forming Place 1 drop into both eyes every morning.    Past Medical History:  Diagnosis Date   Anxiety    Aortic atherosclerosis (HCC)    Arthritis    osteoarthritis in hands, knees ( R torn cartilage, L meniscus removed)    Atherosclerosis of native coronary artery of native heart with unstable angina pectoris (HCC)    CAD (coronary artery disease) 1999   a.) s/p PCI (details unkown) in [place] in 1999   Campylobacter antigen positive 11/29/2014   a.) [facility] ED with N/V/D; stool culture (+)   ESRD on hemodialysis (HCC)    a.) T-Th-Sat   Glaucoma    Hypercholesterolemia    Hyperlipidemia    Hypertension    Hypothyroidism    Lumbar stenosis with neurogenic claudication    Perforation of colon as colonoscopy complication (HCC) 2010   a.) required short term colostomy with subsequent take down/reversal   RLS (restless legs syndrome)    Thrombocytosis    Type 2 diabetes mellitus with chronic kidney disease (HCC)     Past Surgical History:  Procedure Laterality  Date   ABDOMINAL HYSTERECTOMY     left ovary removed   AV FISTULA PLACEMENT Left 05/25/2023   Procedure: ARTERIOVENOUS (AV) FISTULA CREATION (BRACHIALCEPHALIC);  Surgeon: Renford Dills, MD;  Location: [facility] ORS;  Service: Vascular;  Laterality: Left;   BLEPHAROPLASTY Bilateral    CATARACT EXTRACTION W/ INTRAOCULAR LENS  IMPLANT, BILATERAL     COLONOSCOPY  2010   COLOSTOMY  03/2009   perforated colon from colonoscopy   COLOSTOMY REVERSAL  08/2009   CORONARY ANGIOPLASTY WITH STENT PLACEMENT  1999   DIALYSIS/PERMA CATHETER INSERTION N/A 03/08/2023   Procedure: DIALYSIS/PERMA CATHETER INSERTION;  Surgeon: Annice Needy, MD;  Location: [facility] INVASIVE CV LAB;  Service: Cardiovascular;   Laterality: N/A;   DIALYSIS/PERMA CATHETER REMOVAL N/A 09/05/2023   Procedure: DIALYSIS/PERMA CATHETER REMOVAL;  Surgeon: Annice Needy, MD;  Location: [facility] INVASIVE CV LAB;  Service: Cardiovascular;  Laterality: N/A;   FOOT SURGERY Bilateral    toes correction   KNEE ARTHROSCOPY Right    torn cartilage   KNEE ARTHROSCOPY W/ MENISCECTOMY Left 01/2012   TEMPORARY DIALYSIS CATHETER N/A 03/05/2023   Procedure: TEMPORARY DIALYSIS CATHETER;  Surgeon: Annice Needy, MD;  Location: [facility] INVASIVE CV LAB;  Service: Cardiovascular;  Laterality: N/A;    Social History Social History   Tobacco Use   Smoking status: Former    Current packs/day: 0.00    Types: Cigarettes    Quit date: 01/21/1980    Years since quitting: 43.9   Smokeless tobacco: Never  Vaping Use   Vaping status: Never Used  Substance Use Topics   Alcohol use: Yes    Alcohol/week: 0.0 standard drinks of alcohol    Comment: occasionally   Drug use: No    Family History Family History  Problem Relation Age of Onset   Hypertension Mother    Hypertension Father    Early death Father    Heart disease Father     Allergies  Allergen Reactions   Atorvastatin Other (See Comments)    MYALGIA   Cyclobenzaprine Other (See Comments)    hallucination   Hydrocodone-Acetaminophen     Other reaction(s): Hallucination when taking whole pill, pt tolerates taking 1/2 pill   Mirtazapine     Other reaction(s): Hallucination   Oxycodone-Acetaminophen Other (See Comments)    hallucination   Paroxetine Hcl Other (See Comments)    hallucination   Penicillins Swelling    Lip and orbital swelling Tolerated 3rd generation cephalosporin (CEFTRIAXONE) between 03/05/2023 and 03/09/2023 with no documented ADRs.   Propoxyphene Other (See Comments)    hallucination   Trazodone Other (See Comments)    hallucination     REVIEW OF SYSTEMS (Negative unless checked)  Constitutional: [] Weight loss  [] Fever  [] Chills Cardiac: [] Chest pain    [] Chest pressure   [] Palpitations   [] Shortness of breath when laying flat   [] Shortness of breath with exertion. Vascular:  [] Pain in legs with walking   [] Pain in legs at rest  [] History of DVT   [] Phlebitis   [] Swelling in legs   [] Varicose veins   [] Non-healing ulcers Pulmonary:   [] Uses home oxygen   [] Productive cough   [] Hemoptysis   [] Wheeze  [] COPD   [] Asthma Neurologic:  [] Dizziness   [] Seizures   [] History of stroke   [] History of TIA  [] Aphasia   [] Vissual changes   [] Weakness or numbness in arm   [] Weakness or numbness in leg Musculoskeletal:   [] Joint swelling   [] Joint pain   [] Low back pain  Hematologic:  [] Easy bruising  [] Easy bleeding   [] Hypercoagulable state   [] Anemic Gastrointestinal:  [] Diarrhea   [] Vomiting  [] Gastroesophageal reflux/heartburn   [] Difficulty swallowing. Genitourinary:  [x] Chronic kidney disease   [] Difficult urination  [] Frequent urination   [] Blood in urine Skin:  [] Rashes   [] Ulcers  Psychological:  [] History of anxiety   []  History of major depression.  Physical Examination  Vitals:   12/25/23 1311  BP: (!) 178/66  Pulse: 78  Resp: 20  Temp: 97.7 F (36.5 C)  TempSrc: Oral  SpO2: 91%  Weight: 57.6 kg  Height: 5' (1.524 m)   Body mass index is 24.8 kg/m. Gen: WD/WN, NAD Head: [place]/AT, No temporalis wasting.  Ear/Nose/Throat: Hearing grossly intact, nares w/o erythema or drainage Eyes: PER, EOMI, sclera nonicteric.  Neck: Supple, no gross masses or lesions.  No JVD.  Pulmonary:  Good air movement, no audible wheezing, no use of accessory muscles.  Cardiac: RRR, precordium non-hyperdynamic. Vascular:   Poor thrill poor bruit Vessel Right Left  Radial Palpable Palpable  Brachial Palpable Palpable  Gastrointestinal: soft, non-distended. No guarding/no peritoneal signs.  Musculoskeletal: M/S 5/5 throughout.  No deformity.  Neurologic: CN 2-12 intact. Pain and light touch intact in extremities.  Symmetrical.  Speech is fluent. Motor exam as  listed above. Psychiatric: Judgment intact, Mood & affect appropriate for pt's clinical situation. Dermatologic: No rashes or ulcers noted.  No changes consistent with cellulitis.   CBC Lab Results  Component Value Date   WBC 25.0 (H) 11/26/2023   HGB 15.3 (H) 11/26/2023   HCT 48.1 (H) 11/26/2023   MCV 102.3 (H) 11/26/2023   PLT 513 (H) 11/26/2023    BMET    Component Value Date/Time   NA 137 11/26/2023 1129   NA 137 12/07/2012 1641   K 4.4 11/26/2023 1129   K 4.2 12/07/2012 1641   CL 95 (L) 11/26/2023 1129   CL 106 12/07/2012 1641   CO2 24 11/26/2023 1129   CO2 27 12/07/2012 1641   GLUCOSE 174 (H) 11/26/2023 1129   GLUCOSE 75 12/07/2012 1641   BUN 54 (H) 11/26/2023 1129   BUN 18 12/07/2012 1641   CREATININE 6.87 (H) 11/26/2023 1129   CREATININE 1.13 12/07/2012 1641   CALCIUM 7.2 (L) 11/26/2023 1129   CALCIUM 8.9 12/07/2012 1641   GFRNONAA 5 (L) 11/26/2023 1129   GFRNONAA 45 (L) 12/07/2012 1641   GFRAA 25 (L) 03/30/2020 1026   GFRAA 52 (L) 12/07/2012 1641   CrCl cannot be calculated (Patient's most recent lab result is older than the maximum 21 days allowed.).  COAG Lab Results  Component Value Date   INR 1.1 12/10/2021   INR 1.0 10/12/2021   INR 1.1 05/15/2021    Radiology No results found.   Assessment/Plan  End stage renal disease: Recommend:  The patient is experiencing increasing problems with their dialysis access.  Patient should have a fistulagram with the intention for intervention.  The intention for intervention is to restore appropriate flow and prevent thrombosis and possible loss of the access.  As well as improve the quality of dialysis therapy.  The risks, benefits and alternative therapies were reviewed in detail with the patient.  All questions were answered.  The patient agrees to proceed with angio/intervention.    The patient will follow up with me in the office after the procedure.    Levora Dredge, MD  12/25/2023 2:35  PM

## 2023-12-26 ENCOUNTER — Encounter: Payer: Self-pay | Admitting: Vascular Surgery

## 2023-12-31 ENCOUNTER — Encounter (INDEPENDENT_AMBULATORY_CARE_PROVIDER_SITE_OTHER): Payer: Medicare Other

## 2023-12-31 ENCOUNTER — Ambulatory Visit (INDEPENDENT_AMBULATORY_CARE_PROVIDER_SITE_OTHER): Payer: Medicare Other | Admitting: Vascular Surgery

## 2024-01-16 ENCOUNTER — Other Ambulatory Visit (INDEPENDENT_AMBULATORY_CARE_PROVIDER_SITE_OTHER): Payer: Self-pay | Admitting: Vascular Surgery

## 2024-01-16 DIAGNOSIS — N186 End stage renal disease: Secondary | ICD-10-CM

## 2024-01-18 ENCOUNTER — Encounter (INDEPENDENT_AMBULATORY_CARE_PROVIDER_SITE_OTHER)

## 2024-01-18 ENCOUNTER — Ambulatory Visit (INDEPENDENT_AMBULATORY_CARE_PROVIDER_SITE_OTHER): Admitting: Nurse Practitioner

## 2024-02-13 ENCOUNTER — Telehealth (INDEPENDENT_AMBULATORY_CARE_PROVIDER_SITE_OTHER): Payer: Self-pay

## 2024-02-13 NOTE — Telephone Encounter (Signed)
 Pt LVM stating that dialysis center sent over emergency orders yesterday for her arm.  She states that she has an appt for an epidural today and needs to know if we are going to do anything about her emergency with her arm.  I tried to call pt to get further information with no answer.

## 2024-02-13 NOTE — Telephone Encounter (Signed)
 You can also try reaching out to her dialysis center as well, I am unable to give advice until we know exactly what is going on.

## 2024-02-14 ENCOUNTER — Ambulatory Visit (INDEPENDENT_AMBULATORY_CARE_PROVIDER_SITE_OTHER): Admitting: Vascular Surgery

## 2024-02-14 VITALS — BP 153/73 | HR 76 | Resp 17 | Ht 61.0 in | Wt 128.6 lb

## 2024-02-14 DIAGNOSIS — E1122 Type 2 diabetes mellitus with diabetic chronic kidney disease: Secondary | ICD-10-CM

## 2024-02-14 DIAGNOSIS — N186 End stage renal disease: Secondary | ICD-10-CM | POA: Diagnosis not present

## 2024-02-14 DIAGNOSIS — Z992 Dependence on renal dialysis: Secondary | ICD-10-CM

## 2024-02-14 DIAGNOSIS — I2510 Atherosclerotic heart disease of native coronary artery without angina pectoris: Secondary | ICD-10-CM | POA: Diagnosis not present

## 2024-02-14 DIAGNOSIS — T829XXS Unspecified complication of cardiac and vascular prosthetic device, implant and graft, sequela: Secondary | ICD-10-CM

## 2024-02-14 DIAGNOSIS — I10 Essential (primary) hypertension: Secondary | ICD-10-CM | POA: Diagnosis not present

## 2024-02-18 ENCOUNTER — Encounter (INDEPENDENT_AMBULATORY_CARE_PROVIDER_SITE_OTHER): Payer: Self-pay | Admitting: Vascular Surgery

## 2024-02-18 NOTE — Progress Notes (Signed)
 MRN : [id number]  Cassandra [name] is a 89 y.o. (05/06/1927) female who presents with chief complaint of check access.  History of Present Illness:   The patient returns to the office for follow up regarding a problem with their dialysis access.   The patient notes a significant increase in problems with dialysis.  It is reported that adequate dialysis is not being achieved.    She recently sustained a very large hematoma during her dialysis run.  The patient denies hand pain or other symptoms consistent with steal phenomena.  No significant arm swelling.  The patient denies redness or swelling at the access site. The patient denies fever or chills at home or while on dialysis.  No recent shortening of the patient's walking distance or new symptoms consistent with claudication.  No history of rest pain symptoms. No new ulcers or wounds of the lower extremities have occurred.  The patient denies amaurosis fugax or recent TIA symptoms. There are no recent neurological changes noted. There is no history of DVT, PE or superficial thrombophlebitis. No recent episodes of angina or shortness of breath documented.    Current Meds  Medication Sig   ALPRAZolam  (XANAX ) 0.25 MG tablet Take 1 tablet (0.25 mg total) by mouth daily as needed.   amLODipine  (NORVASC ) 10 MG tablet Take 10 mg by mouth daily.   aspirin  EC 81 MG EC tablet Take 1 tablet (81 mg total) by mouth daily. Swallow whole.   calcium  acetate (PHOSLO) 667 MG capsule Take 1,334 mg by mouth 3 (three) times daily.   cetirizine (ZYRTEC) 10 MG tablet Take 1 tablet by mouth daily as needed for allergies.   CVS D3 25 MCG (1000 UT) capsule Take 1,000 Units by mouth daily.   DROXIA  300 MG capsule TAKE 1 CAPSULE (300 MG TOTAL) BY MOUTH DAILY. MAY TAKE WITH FOOD TO MINIMIZE GI SIDE EFFECTS.   gabapentin  (NEURONTIN ) 100 MG capsule Take 2 capsules (200 mg total) by mouth at bedtime. (Patient taking differently: Take 100-200  mg by mouth 3 (three) times daily. 100 mg am 100 mg afternoon 200 mg bedtime)   glucose blood test strip Use once daily. Use as instructed.   HYDROcodone -acetaminophen  (NORCO/VICODIN) 5-325 MG tablet Take 1 tablet by mouth every 6 (six) hours as needed for moderate pain (pain score 4-6) or severe pain (pain score 7-10).   latanoprost  (XALATAN ) 0.005 % ophthalmic solution Place 1 drop into both eyes at bedtime.   levothyroxine  (SYNTHROID ) 88 MCG tablet Take 88 mcg by mouth daily. Take on an empty stomach 30 to 60 minutes before breakfast   multivitamin (RENA-VIT) TABS tablet Take 1 tablet by mouth at bedtime.   rOPINIRole  (REQUIP ) 0.5 MG tablet Take 1 tablet (0.5 mg total) by mouth in the morning and at bedtime.   rosuvastatin  (CRESTOR ) 20 MG tablet Take 20 mg by mouth daily.   timolol  (TIMOPTIC -XR) 0.5 % ophthalmic gel-forming Place 1 drop into both eyes every morning.    Past Medical History:  Diagnosis Date   Anxiety    Aortic atherosclerosis (HCC)    Arthritis    osteoarthritis in hands, knees ( R torn cartilage, L meniscus removed)    Atherosclerosis of native coronary artery of native heart with unstable angina pectoris (HCC)    CAD (coronary artery disease) 1999   a.) s/p PCI (details unkown) in [place]  in 1999  Campylobacter antigen positive 11/29/2014   a.) presented to [facility] ED with N/V/D; stool culture (+)   ESRD on hemodialysis (HCC)    a.) T-Th-Sat   Glaucoma    Hypercholesterolemia    Hyperlipidemia    Hypertension    Hypothyroidism    Lumbar stenosis with neurogenic claudication    Perforation of colon as colonoscopy complication (HCC) 2010   a.) required short term colostomy with subsequent take down/reversal   RLS (restless legs syndrome)    Thrombocytosis    Type 2 diabetes mellitus with chronic kidney disease [facility])     Past Surgical History:  Procedure Laterality Date   A/V FISTULAGRAM Left 12/25/2023   Procedure: A/V Fistulagram;  Surgeon: Jackquelyn Mass, MD;  Location: [facility] INVASIVE CV LAB;  Service: Cardiovascular;  Laterality: Left;   ABDOMINAL HYSTERECTOMY     left ovary removed   AV FISTULA PLACEMENT Left 05/25/2023   Procedure: ARTERIOVENOUS (AV) FISTULA CREATION (BRACHIALCEPHALIC);  Surgeon: Jackquelyn Mass, MD;  Location: [facility] ORS;  Service: Vascular;  Laterality: Left;   BLEPHAROPLASTY Bilateral    CATARACT EXTRACTION W/ INTRAOCULAR LENS  IMPLANT, BILATERAL     COLONOSCOPY  2010   COLOSTOMY  03/2009   perforated colon from colonoscopy   COLOSTOMY REVERSAL  08/2009   CORONARY ANGIOPLASTY WITH STENT PLACEMENT  1999   DIALYSIS/PERMA CATHETER INSERTION N/A 03/08/2023   Procedure: DIALYSIS/PERMA CATHETER INSERTION;  Surgeon: Celso College, MD;  Location: [facility] INV[facility];  Service: Cardiovascular;  Laterality: N/A;   DIALYSIS/PERMA CATHETER REMOVAL N/A 09/05/2023   Procedure: DIALYSIS/PERMA CATHETER REMOVAL;  Surgeon: Celso College, MD;  Location: [facility] CV [facility];  Service: Cardiovascular;  Laterality: N/A;   FOOT SURGERY Bilateral    toes correction   KNEE ARTHROSCOPY Right    torn cartilage   KNEE ARTHROSCOPY W/ MENISCECTOMY Left 01/2012   TEMPORARY DIALYSIS CATHETER N/A 03/05/2023   Procedure: TEMPORARY DIALYSIS CATHETER;  Surgeon: Celso College, MD;  Location: [facility] INV[facility] CV LAB;  Service: Cardiovascular;  Laterality: N/A;    Social History Social History   Tobacco Use   Smoking status: Former    Current packs/day: 0.00    Types: Cigarettes    Quit date: 01/21/1980    Years since quitting: 44.1   Smokeless tobacco: Never  Vaping Use   Vaping status: Never Used  Substance Use Topics   Alcohol use: Yes    Alcohol/week: 0.0 standard drinks of alcohol    Comment: occasionally   Drug use: No    Family History Family History  Problem Relation Age of Onset   Hypertension Mother    Hypertension Father    Early death Father    Heart disease Father     Allergies  Allergen Reactions    Atorvastatin Other (See Comments)    MYALGIA   Cyclobenzaprine Other (See Comments)    hallucination   Hydrocodone -Acetaminophen      Other reaction(s): Hallucination when taking whole pill, pt tolerates taking 1/2 pill   Mirtazapine     Other reaction(s): Hallucination   Oxycodone-Acetaminophen  Other (See Comments)    hallucination   Paroxetine Hcl Other (See Comments)    hallucination   Penicillins Swelling    Lip and orbital swelling Tolerated 3rd generation cephalosporin (CEFTRIAXONE ) between 03/05/2023 and 03/09/2023 with no documented ADRs.   Propoxyphene Other (See Comments)    hallucination   Trazodone Other (See Comments)    hallucination     REVIEW OF SYSTEMS (Negative unless checked)  Constitutional: [] Weight loss  [] Fever  [] Chills Cardiac: [] Chest pain   [] Chest pressure   [] Palpitations   [] Shortness of breath when laying flat   [] Shortness of breath with exertion. Vascular:  [] Pain in legs with walking   [] Pain in legs at rest  [] History of DVT   [] Phlebitis   [] Swelling in legs   [] Varicose veins   [] Non-healing ulcers Pulmonary:   [] Uses home oxygen   [] Productive cough   [] Hemoptysis   [] Wheeze  [] COPD   [] Asthma Neurologic:  [] Dizziness   [] Seizures   [] History of stroke   [] History of TIA  [] Aphasia   [] Vissual changes   [] Weakness or numbness in arm   [] Weakness or numbness in leg Musculoskeletal:   [] Joint swelling   [] Joint pain   [] Low back pain Hematologic:  [] Easy bruising  [] Easy bleeding   [] Hypercoagulable state   [] Anemic Gastrointestinal:  [] Diarrhea   [] Vomiting  [] Gastroesophageal reflux/heartburn   [] Difficulty swallowing. Genitourinary:  [x] Chronic kidney disease   [] Difficult urination  [] Frequent urination   [] Blood in urine Skin:  [] Rashes   [] Ulcers  Psychological:  [] History of anxiety   []  History of major depression.  Physical Examination  Vitals:   02/14/24 1136 02/14/24 1145  BP: (!) 185/70 (!) 153/73  Pulse: 90 76  Resp: 17    Weight: 128 lb 9.6 oz (58.3 kg)   Height: 5\' 1"  (1.549 m)    Body mass index is 24.3 kg/m. Gen: WD/WN, NAD Head: /AT, No temporalis wasting.  Ear/Nose/Throat: Hearing grossly intact, nares w/o erythema or drainage Eyes: PER, EOMI, sclera nonicteric.  Neck: Supple, no gross masses or lesions.  No JVD.  Pulmonary:  Good air movement, no audible wheezing, no use of accessory muscles.  Cardiac: RRR, precordium non-hyperdynamic. Vascular:   Hematoma left arm AV access.  Good thrill good bruit is still present. Vessel Right Left  Radial Palpable Palpable  Brachial Palpable Palpable  Gastrointestinal: soft, non-distended. No guarding/no peritoneal signs.  Musculoskeletal: M/S 5/5 throughout.  No deformity.  Neurologic: CN 2-12 intact. Pain and light touch intact in extremities.  Symmetrical.  Speech is fluent. Motor exam as listed above. Psychiatric: Judgment intact, Mood & affect appropriate for pt's clinical situation. Dermatologic: No rashes or ulcers noted.  No changes consistent with cellulitis.   CBC Lab Results  Component Value Date   WBC 25.0 (H) 11/26/2023   HGB 15.3 (H) 11/26/2023   HCT 48.1 (H) 11/26/2023   MCV 102.3 (H) 11/26/2023   PLT 513 (H) 11/26/2023    BMET    Component Value Date/Time   NA 137 11/26/2023 1129   NA 137 12/07/2012 1641   K 4.4 11/26/2023 1129   K 4.2 12/07/2012 1641   CL 95 (L) 11/26/2023 1129   CL 106 12/07/2012 1641   CO2 24 11/26/2023 1129   CO2 27 12/07/2012 1641   GLUCOSE 174 (H) 11/26/2023 1129   GLUCOSE 75 12/07/2012 1641   BUN 54 (H) 11/26/2023 1129   BUN 18 12/07/2012 1641   CREATININE 6.87 (H) 11/26/2023 1129   CREATININE 1.13 12/07/2012 1641   CALCIUM  7.2 (L) 11/26/2023 1129   CALCIUM  8.9 12/07/2012 1641   GFRNONAA 5 (L) 11/26/2023 1129   GFRNONAA 45 (L) 12/07/2012 1641   GFRAA 25 (L) 03/30/2020 1026   GFRAA 52 (L) 12/07/2012 1641   CrCl cannot be calculated (Patient's most recent lab result is older than the maximum  21 days allowed.).  COAG Lab Results  Component Value Date  INR 1.1 12/10/2021   INR 1.0 10/12/2021   INR 1.1 05/15/2021    Radiology No results found.   Assessment/Plan 1. Complication of vascular access for dialysis, sequela (Primary) Recommend:  The patient is experiencing increasing problems with their dialysis access.  Patient should have a follow-up duplex stat given that they have been able to do dialysis since sustaining the hematoma.  I will allow for some resolution so that we may reassess if there is any confirmational changes to the fistula secondary to the hematoma.  In the event that occurs angiography with the intention for intervention is to restore appropriate flow and prevent thrombosis and possible loss of the access will be recommended.  As well as improve the quality of dialysis therapy.  The risks, benefits and alternative therapies were reviewed in detail with the patient.  All questions were answered.  The patient agrees to proceed with angio/intervention.    The patient will follow up with me in the office after the procedure.  - VAS US  DUPLEX DIALYSIS ACCESS (AVF, AVG); Future  2. ESRD (end stage renal disease) on dialysis (HCC) See #1  3. Primary hypertension Continue antihypertensive medications as already ordered, these medications have been reviewed and there are no changes at this time.  4. Coronary artery disease involving native coronary artery of native heart without angina pectoris Continue cardiac and antihypertensive medications as already ordered and reviewed, no changes at this time.  Continue statin as ordered and reviewed, no changes at this time  Nitrates PRN for chest pain  5. Type 2 diabetes mellitus with chronic kidney disease on chronic dialysis, without long-term current use of insulin  (HCC) Continue hypoglycemic medications as already ordered, these medications have been reviewed and there are no changes at this time.  Hgb  A1C to be monitored as already arranged by primary service   Devon Fogo, MD  02/18/2024 11:38 AM

## 2024-02-29 ENCOUNTER — Encounter (INDEPENDENT_AMBULATORY_CARE_PROVIDER_SITE_OTHER): Payer: Self-pay | Admitting: Nurse Practitioner

## 2024-02-29 ENCOUNTER — Ambulatory Visit (INDEPENDENT_AMBULATORY_CARE_PROVIDER_SITE_OTHER)

## 2024-02-29 ENCOUNTER — Ambulatory Visit (INDEPENDENT_AMBULATORY_CARE_PROVIDER_SITE_OTHER): Admitting: Nurse Practitioner

## 2024-02-29 VITALS — BP 136/76 | HR 74 | Resp 16

## 2024-02-29 DIAGNOSIS — I10 Essential (primary) hypertension: Secondary | ICD-10-CM | POA: Diagnosis not present

## 2024-02-29 DIAGNOSIS — Z992 Dependence on renal dialysis: Secondary | ICD-10-CM

## 2024-02-29 DIAGNOSIS — N186 End stage renal disease: Secondary | ICD-10-CM

## 2024-02-29 DIAGNOSIS — T829XXS Unspecified complication of cardiac and vascular prosthetic device, implant and graft, sequela: Secondary | ICD-10-CM | POA: Diagnosis not present

## 2024-03-10 ENCOUNTER — Encounter (INDEPENDENT_AMBULATORY_CARE_PROVIDER_SITE_OTHER): Payer: Self-pay | Admitting: Nurse Practitioner

## 2024-03-10 NOTE — Progress Notes (Signed)
 Subjective:    Patient ID: Cassandra [name], female    DOB: 1927-07-26, 89 y.o.   MRN: [id number] Chief Complaint  Patient presents with   Follow-up    Follow up 2 weeks + HDA    The patient returns today after several weeks following a large hematoma after an issue with a stick during dialysis.  The swelling has greatly decreased in no significant evidence of a hematoma seen today.  She notes that she is dialyzing without any significant issue currently.  She denies any pain in her upper extremity.  She currently has a flow volume of 1248 with notable areas of significant stenosis noted today.  She also has a very good thrill and bruit noted today.    Review of Systems  Neurological:  Positive for weakness.  All other systems reviewed and are negative.      Objective:   Physical Exam Vitals reviewed.  HENT:     Head: Normocephalic.   Cardiovascular:     Rate and Rhythm: Normal rate.     Pulses:          Radial pulses are 1+ on the left side.     Arteriovenous access: Left arteriovenous access is present.  Pulmonary:     Effort: Pulmonary effort is normal.   Skin:    General: Skin is warm and dry.   Neurological:     Mental Status: She is alert and oriented to person, place, and time.   Psychiatric:        Mood and Affect: Mood normal.        Behavior: Behavior normal.        Thought Content: Thought content normal.        Judgment: Judgment normal.     BP 136/76 (BP Location: Right Arm, Patient Position: Sitting, Cuff Size: Normal)   Pulse 74   Resp 16   Past Medical History:  Diagnosis Date   Anxiety    Aortic atherosclerosis (HCC)    Arthritis    osteoarthritis in hands, knees ( R torn cartilage, L meniscus removed)    Atherosclerosis of native coronary artery of native heart with unstable angina pectoris (HCC)    CAD (coronary artery disease) 1999   a.) s/p PCI (details unkown) in [place]  in 1999   Campylobacter antigen positive 11/29/2014   a.)  presented to [facility] ED with N/V/D; stool culture (+)   ESRD on hemodialysis (HCC)    a.) T-Th-Sat   Glaucoma    Hypercholesterolemia    Hyperlipidemia    Hypertension    Hypothyroidism    Lumbar stenosis with neurogenic claudication    Perforation of colon as colonoscopy complication (HCC) 2010   a.) required short term colostomy with subsequent take down/reversal   RLS (restless legs syndrome)    Thrombocytosis    Type 2 diabetes mellitus with chronic kidney disease (HCC)     Social History   Socioeconomic History   Marital status: Married    Spouse name: Sammie Crigler   Number of children: 3   Years of education: Not on file   Highest education level: Not on file  Occupational History   Not on file  Tobacco Use   Smoking status: Former    Current packs/day: 0.00    Types: Cigarettes    Quit date: 01/21/1980    Years since quitting: 44.1   Smokeless tobacco: Never  Vaping Use   Vaping status: Never Used  Substance and Sexual Activity  Alcohol use: Yes    Alcohol/week: 0.0 standard drinks of alcohol    Comment: occasionally   Drug use: No   Sexual activity: Never  Other Topics Concern   Not on file  Social History Narrative   Lives with spouse Sammie Crigler in an apt.    Social Drivers of Corporate investment banker Strain: Low Risk  (11/14/2023)   Received from [facility]   Overall Financial Resource Strain (CARDIA)    Difficulty of Paying Living Expenses: Not hard at all  Food Insecurity: No Food Insecurity (11/14/2023)   Received from [facility]   Hunger Vital Sign    Within the past 12 months, you worried that your food would run out before you got the money to buy more.: Never true    Within the past 12 months, the food you bought just didn't last and you didn't have money to get more.: Never true  Transportation Needs: No Transportation Needs (11/14/2023)   Received from [facility] - Transportation     In the past 12 months, has lack of transportation kept you from medical appointments or from getting medications?: No    Lack of Transportation (Non-Medical): No  Physical Activity: Not on file  Stress: Not on file  Social Connections: Not on file  Intimate Partner Violence: Not At Risk (08/25/2023)   Humiliation, Afraid, Rape, and Kick questionnaire    Fear of Current or Ex-Partner: No    Emotionally Abused: No    Physically Abused: No    Sexually Abused: No    Past Surgical History:  Procedure Laterality Date   A/V FISTULAGRAM Left 12/25/2023   Procedure: A/V Fistulagram;  Surgeon: Jackquelyn Mass, MD;  Location: [facility] INVASIVE [facility];  Service: Cardiovascular;  Laterality: Left;   ABDOMINAL HYSTERECTOMY     left ovary removed   AV FISTULA PLACEMENT Left 05/25/2023   Procedure: ARTERIOVENOUS (AV) FISTULA CREATION (BRACHIALCEPHALIC);  Surgeon: Jackquelyn Mass, MD;  Location: [facility] ORS;  Service: Vascular;  Laterality: Left;   BLEPHAROPLASTY Bilateral    CATARACT EXTRACTION W/ INTRAOCULAR LENS  IMPLANT, BILATERAL     COLONOSCOPY  2010   COLOSTOMY  03/2009   perforated colon from colonoscopy   COLOSTOMY REVERSAL  08/2009   CORONARY ANGIOPLASTY WITH STENT PLACEMENT  1999   DIALYSIS/PERMA CATHETER INSERTION N/A 03/08/2023   Procedure: DIALYSIS/PERMA CATHETER INSERTION;  Surgeon: Celso College, MD;  Location: [facility];  Service: Cardiovascular;  Laterality: N/A;   DIALYSIS/PERMA CATHETER REMOVAL N/A 09/05/2023   Procedure: DIALYSIS/PERMA CATHETER REMOVAL;  Surgeon: Celso College, MD;  Location: [facility];  Service: Cardiovascular;  Laterality: N/A;   FOOT SURGERY Bilateral    toes correction   KNEE ARTHROSCOPY Right    torn cartilage   KNEE ARTHROSCOPY W/ MENISCECTOMY Left 01/2012   TEMPORARY DIALYSIS CATHETER N/A 03/05/2023   Procedure: TEMPORARY DIALYSIS CATHETER;  Surgeon: Celso College, MD;  Location: [facility];  Service: Cardiovascular;   Laterality: N/A;    Family History  Problem Relation Age of Onset   Hypertension Mother    Hypertension Father    Early death Father    Heart disease Father     Allergies  Allergen Reactions   Atorvastatin Other (See Comments)    MYALGIA   Cyclobenzaprine Other (See Comments)    hallucination   Hydrocodone -Acetaminophen      Other reaction(s): Hallucination when taking whole pill, pt  tolerates taking 1/2 pill   Mirtazapine     Other reaction(s): Hallucination   Oxycodone-Acetaminophen  Other (See Comments)    hallucination   Paroxetine Hcl Other (See Comments)    hallucination   Penicillins Swelling    Lip and orbital swelling Tolerated 3rd generation cephalosporin (CEFTRIAXONE ) between 03/05/2023 and 03/09/2023 with no documented ADRs.   Propoxyphene Other (See Comments)    hallucination   Trazodone Other (See Comments)    hallucination       Latest Ref Rng & Units 11/26/2023   11:29 AM 08/27/2023    4:08 AM 08/26/2023    9:49 AM  CBC  WBC 4.0 - 10.5 K/uL 25.0  12.1  14.4   Hemoglobin 12.0 - 15.0 g/dL 16.1  09.6  04.5   Hematocrit 36.0 - 46.0 % 48.1  35.8  34.2   Platelets 150 - 400 K/uL 513  524  460       CMP     Component Value Date/Time   NA 137 11/26/2023 1129   NA 137 12/07/2012 1641   K 4.4 11/26/2023 1129   K 4.2 12/07/2012 1641   CL 95 (L) 11/26/2023 1129   CL 106 12/07/2012 1641   CO2 24 11/26/2023 1129   CO2 27 12/07/2012 1641   GLUCOSE 174 (H) 11/26/2023 1129   GLUCOSE 75 12/07/2012 1641   BUN 54 (H) 11/26/2023 1129   BUN 18 12/07/2012 1641   CREATININE 6.87 (H) 11/26/2023 1129   CREATININE 1.13 12/07/2012 1641   CALCIUM  7.2 (L) 11/26/2023 1129   CALCIUM  8.9 12/07/2012 1641   PROT 8.0 08/24/2023 1332   PROT 8.6 (H) 12/07/2012 1641   ALBUMIN  3.2 (L) 08/24/2023 1332   ALBUMIN  3.2 (L) 12/07/2012 1641   AST 29 08/24/2023 1332   AST 37 12/07/2012 1641   ALT 17 08/24/2023 1332   ALT 33 12/07/2012 1641   ALKPHOS 98 08/24/2023 1332   ALKPHOS  72 12/07/2012 1641   BILITOT 0.9 08/24/2023 1332   BILITOT 0.4 12/07/2012 1641   GFRNONAA 5 (L) 11/26/2023 1129   GFRNONAA 45 (L) 12/07/2012 1641     No results found.     Assessment & Plan:   1. ESRD (end stage renal disease) on dialysis (HCC) (Primary) Recommend:  The patient is doing well and currently has adequate dialysis access.  Although there are some parameters suggesting possible future issues.  The patient's dialysis center is not reporting any major access issues.  However, theres are concerns that the access is at moderate but not high risk for a problem or thrombosis and should be followed more closely  The patient will follow-up with me in the office in 3 months.  The need for a follow up duplex ultrasound will be made at that time based on whether problems with the access are persistent.    2. Primary hypertension Continue antihypertensive medications as already ordered, these medications have been reviewed and there are no changes at this time.   Current Outpatient Medications on File Prior to Visit  Medication Sig Dispense Refill   ALPRAZolam  (XANAX ) 0.25 MG tablet Take 1 tablet (0.25 mg total) by mouth daily as needed. 10 tablet 0   aspirin  EC 81 MG EC tablet Take 1 tablet (81 mg total) by mouth daily. Swallow whole. 30 tablet 11   calcium  acetate (PHOSLO) 667 MG capsule Take 1,334 mg by mouth 3 (three) times daily.     cetirizine (ZYRTEC) 10 MG tablet Take 1 tablet by mouth daily  as needed for allergies.     [employer] D3 25 MCG (1000 UT) capsule Take 1,000 Units by mouth daily.     DROXIA  300 MG capsule TAKE 1 CAPSULE (300 MG TOTAL) BY MOUTH DAILY. MAY TAKE WITH FOOD TO MINIMIZE GI SIDE EFFECTS. 30 capsule 0   gabapentin  (NEURONTIN ) 100 MG capsule Take 2 capsules (200 mg total) by mouth at bedtime. (Patient taking differently: Take 100-200 mg by mouth 3 (three) times daily. 100 mg am 100 mg afternoon 200 mg bedtime) 60 capsule 0   glucose blood test strip Use once  daily. Use as instructed.     HYDROcodone -acetaminophen  (NORCO/VICODIN) 5-325 MG tablet Take 1 tablet by mouth every 6 (six) hours as needed for moderate pain (pain score 4-6) or severe pain (pain score 7-10).     latanoprost  (XALATAN ) 0.005 % ophthalmic solution Place 1 drop into both eyes at bedtime.     levothyroxine  (SYNTHROID ) 88 MCG tablet Take 88 mcg by mouth daily. Take on an empty stomach 30 to 60 minutes before breakfast     multivitamin (RENA-VIT) TABS tablet Take 1 tablet by mouth at bedtime.  0   rOPINIRole  (REQUIP ) 0.5 MG tablet Take 1 tablet (0.5 mg total) by mouth in the morning and at bedtime. 180 tablet 3   rosuvastatin  (CRESTOR ) 20 MG tablet Take 20 mg by mouth daily.     timolol  (TIMOPTIC -XR) 0.5 % ophthalmic gel-forming Place 1 drop into both eyes every morning.     Nutritional Supplements (FEEDING SUPPLEMENT, NEPRO CARB STEADY,) LIQD Take 237 mLs by mouth 2 (two) times daily between meals. (Patient not taking: Reported on 12/25/2023)  0   traMADol  (ULTRAM ) 50 MG tablet Take 1 tablet (50 mg total) by mouth every 6 (six) hours as needed. (Patient not taking: Reported on 12/25/2023) 26 tablet 0   No current facility-administered medications on file prior to visit.    There are no Patient Instructions on file for this visit. No follow-ups on file.   Mialee Weyman E Consepcion Utt, NP

## 2024-03-24 ENCOUNTER — Encounter: Payer: Self-pay | Admitting: Neurology

## 2024-03-24 ENCOUNTER — Ambulatory Visit (INDEPENDENT_AMBULATORY_CARE_PROVIDER_SITE_OTHER): Payer: Medicare Other | Admitting: Neurology

## 2024-03-24 VITALS — BP 141/61 | HR 71 | Resp 15 | Ht 61.0 in

## 2024-03-24 DIAGNOSIS — G2581 Restless legs syndrome: Secondary | ICD-10-CM

## 2024-03-24 DIAGNOSIS — G629 Polyneuropathy, unspecified: Secondary | ICD-10-CM

## 2024-03-24 MED ORDER — LIDOCAINE-PRILOCAINE 2.5-2.5 % EX CREA: 1.0000 | TOPICAL_CREAM | CUTANEOUS | 6 refills | Status: AC | PRN

## 2024-03-24 MED ORDER — TRAZODONE HCL 50 MG PO TABS: 50.0000 mg | ORAL_TABLET | Freq: Every day | ORAL | 0 refills | Status: DC

## 2024-03-24 NOTE — Progress Notes (Signed)
 [facility]  PATIENT: Cassandra [name] DOB: January 12, 1927  REQUESTING CLINICIAN: Cleotilde Oneil FALCON, MD HISTORY FROM: Patient  REASON FOR VISIT: Stroke    HISTORICAL  CHIEF COMPLAINT:  Chief Complaint  Patient presents with   RESTLESS LEG SYNDROME    Rm12, husband present, Rls: pt stated that the rls has gotten progressively worse    Cerebrovascular Accident    Rm12, husband present, Cva:pt uses rollator for mobility   POLYNEUROPATHY     Rm12, husband present, Polyneuropathy: pt stated that this flares up the most when she is in the dialysis chair    INTERVAL HISTORY 03/24/2024:  Patient presents today for follow-up, she is accompanied by her husband.  Last visit was in November.  Since then she tells me that her restless leg syndrome symptoms have improved with increase of the ropinirole  to 0.5 mg twice daily.  Her main concern at the moment is neuropathic pain on the day of dialysis.  Patient tells me usually within 1 hour into dialysis, she experiences an excruciating neuropathic pain in both legs, sometimes even causing her to cry.  She tells me after dialysis she will take extra gabapentin  which seems to help.  Has tried pregabalin but made her symptoms worse and has not tried any additional medications for the pain.  She is also complaining of trouble sleeping, sometimes it is difficult for her to fall asleep and stay asleep.    INTERVAL HISTORY 08/22/2023 Patient presents today for follow-up, she is accompanied by her husband, last visit was in March 2023.  At that time she presented with acute right basal ganglia stroke.  She did well in terms of the stroke.  Today she is complaining of worsening restless leg syndrome and neuropathy.  She has now end-stage renal disease and on dialysis Tuesday Thursday and Saturday.  For her neuropathy and restless leg syndrome she is on gabapentin  100/100/200 mg and Ropinirole  0.5 mg nightly.  She tells me that her symptoms are  worse at night and now feels like she needs to increase her medication.   HISTORY OF PRESENT ILLNESS:  89 year old woman with vascular risk factor including diabetes mellitus, hypertension, hyperlipidemia who is presenting to the clinic after being admitted to the hospital following a right hemispheric stroke.  Patient presented to the ED due to sudden onset of left-sided weakness and garbled speech, CT head did not show any acute finding but MRI of the brain showed acute ischemic infarcts involving the right basal ganglia, right occipital lobe and right cerebellar hemisphere.  Her angiogram shows severe right distal P2 P3 stenosis and occlusion of the right vertebral artery.  Her strokes were multifocal and were due to likely dehydration or drop in blood pressure.  She was loaded on aspirin  325 mg and started on aspirin  81 mg daily, her echocardiogram show EF of 60 to 65%, she also had an EEG which was negative for any acute seizure.  Her left-sided weakness improved, she was seen and cleared by PT for an acute rehab. Since leaving the hospital, patient reported her symptoms improved, currently she is getting physical therapy once a week but knows to continue self physical therapy at home.    OTHER MEDICAL CONDITIONS: Hypertension, hypothyroidism, hyperlipidemia, RLS, Diabetes    REVIEW OF SYSTEMS: Full 14 system review of systems performed and negative with exception of: as noted in the HPI   ALLERGIES: Allergies  Allergen Reactions   Atorvastatin Other (See Comments)    MYALGIA   Cyclobenzaprine  Other (See Comments)    hallucination   Hydrocodone -Acetaminophen      Other reaction(s): Hallucination when taking whole pill, pt tolerates taking 1/2 pill   Mirtazapine     Other reaction(s): Hallucination   Oxycodone-Acetaminophen  Other (See Comments)    hallucination   Paroxetine Hcl Other (See Comments)    hallucination   Penicillins Swelling    Lip and orbital swelling Tolerated 3rd  generation cephalosporin (CEFTRIAXONE ) between 03/05/2023 and 03/09/2023 with no documented ADRs.   Propoxyphene Other (See Comments)    hallucination   Trazodone Other (See Comments)    hallucination    HOME MEDICATIONS: Outpatient Medications Prior to Visit  Medication Sig Dispense Refill   ALPRAZolam  (XANAX ) 0.25 MG tablet Take 1 tablet (0.25 mg total) by mouth daily as needed. 10 tablet 0   aspirin  EC 81 MG EC tablet Take 1 tablet (81 mg total) by mouth daily. Swallow whole. 30 tablet 11   calcium  acetate (PHOSLO) 667 MG capsule Take 1,334 mg by mouth 3 (three) times daily.     cetirizine (ZYRTEC) 10 MG tablet Take 1 tablet by mouth daily as needed for allergies.     [employer] D3 25 MCG (1000 UT) capsule Take 1,000 Units by mouth daily.     DROXIA  300 MG capsule TAKE 1 CAPSULE (300 MG TOTAL) BY MOUTH DAILY. MAY TAKE WITH FOOD TO MINIMIZE GI SIDE EFFECTS. 30 capsule 0   gabapentin  (NEURONTIN ) 100 MG capsule Take 2 capsules (200 mg total) by mouth at bedtime. (Patient taking differently: Take 100-200 mg by mouth 3 (three) times daily. 100 mg am 100 mg afternoon 200 mg bedtime) 60 capsule 0   glucose blood test strip Use once daily. Use as instructed.     HYDROcodone -acetaminophen  (NORCO/VICODIN) 5-325 MG tablet Take 1 tablet by mouth every 6 (six) hours as needed for moderate pain (pain score 4-6) or severe pain (pain score 7-10).     latanoprost  (XALATAN ) 0.005 % ophthalmic solution Place 1 drop into both eyes at bedtime.     levothyroxine  (SYNTHROID ) 88 MCG tablet Take 88 mcg by mouth daily. Take on an empty stomach 30 to 60 minutes before breakfast     multivitamin (RENA-VIT) TABS tablet Take 1 tablet by mouth at bedtime.  0   Nutritional Supplements (FEEDING SUPPLEMENT, NEPRO CARB STEADY,) LIQD Take 237 mLs by mouth 2 (two) times daily between meals.  0   rOPINIRole  (REQUIP ) 0.5 MG tablet Take 1 tablet (0.5 mg total) by mouth in the morning and at bedtime. 180 tablet 3   rosuvastatin   (CRESTOR ) 20 MG tablet Take 20 mg by mouth daily.     timolol  (TIMOPTIC -XR) 0.5 % ophthalmic gel-forming Place 1 drop into both eyes every morning.     traMADol  (ULTRAM ) 50 MG tablet Take 1 tablet (50 mg total) by mouth every 6 (six) hours as needed. 26 tablet 0   No facility-administered medications prior to visit.    PAST MEDICAL HISTORY: Past Medical History:  Diagnosis Date   Anxiety    Aortic atherosclerosis (HCC)    Arthritis    osteoarthritis in hands, knees ( R torn cartilage, L meniscus removed)    Atherosclerosis of native coronary artery of native heart with unstable angina pectoris (HCC)    CAD (coronary artery disease) 1999   a.) s/p PCI (details unkown) in [place]  in 1999   Campylobacter antigen positive 11/29/2014   a.) presented to [facility] ED with N/V/D; stool culture (+)   ESRD on hemodialysis (HCC)  a.) T-Th-Sat   Glaucoma    Hypercholesterolemia    Hyperlipidemia    Hypertension    Hypothyroidism    Lumbar stenosis with neurogenic claudication    Perforation of colon as colonoscopy complication (HCC) 2010   a.) required short term colostomy with subsequent take down/reversal   RLS (restless legs syndrome)    Thrombocytosis    Type 2 diabetes mellitus with chronic kidney disease (HCC)     PAST SURGICAL HISTORY: Past Surgical History:  Procedure Laterality Date   A/V FISTULAGRAM Left 12/25/2023   Procedure: A/V Fistulagram;  Surgeon: Jama Cordella MATSU, MD;  Location: [facility];  Service: Cardiovascular;  Laterality: Left;   ABDOMINAL HYSTERECTOMY     left ovary removed   AV FISTULA PLACEMENT Left 05/25/2023   Procedure: ARTERIOVENOUS (AV) FISTULA CREATION (BRACHIALCEPHALIC);  Surgeon: Jama Cordella MATSU, MD;  Location: [facility] ORS;  Service: Vascular;  Laterality: Left;   BLEPHAROPLASTY Bilateral    CATARACT EXTRACTION W/ INTRAOCULAR LENS  IMPLANT, BILATERAL     COLONOSCOPY  2010   COLOSTOMY  03/2009   perforated colon from colonoscopy    COLOSTOMY REVERSAL  08/2009   CORONARY ANGIOPLASTY WITH STENT PLACEMENT  1999   DIALYSIS/PERMA CATHETER INSERTION N/A 03/08/2023   Procedure: DIALYSIS/PERMA CATHETER INSERTION;  Surgeon: Marea Selinda RAMAN, MD;  Location: [facility];  Service: Cardiovascular;  Laterality: N/A;   DIALYSIS/PERMA CATHETER REMOVAL N/A 09/05/2023   Procedure: DIALYSIS/PERMA CATHETER REMOVAL;  Surgeon: Marea Selinda RAMAN, MD;  Location: [facility];  Service: Cardiovascular;  Laterality: N/A;   FOOT SURGERY Bilateral    toes correction   KNEE ARTHROSCOPY Right    torn cartilage   KNEE ARTHROSCOPY W/ MENISCECTOMY Left 01/2012   TEMPORARY DIALYSIS CATHETER N/A 03/05/2023   Procedure: TEMPORARY DIALYSIS CATHETER;  Surgeon: Marea Selinda RAMAN, MD;  Location: [facility];  Service: Cardiovascular;  Laterality: N/A;    FAMILY HISTORY: Family History  Problem Relation Age of Onset   Hypertension Mother    Hypertension Father    Early death Father    Heart disease Father     SOCIAL HISTORY: Social History   Socioeconomic History   Marital status: Married    Spouse name: Elsie   Number of children: 3   Years of education: Not on file   Highest education level: Not on file  Occupational History   Not on file  Tobacco Use   Smoking status: Former    Current packs/day: 0.00    Types: Cigarettes    Quit date: 01/21/1980    Years since quitting: 44.2   Smokeless tobacco: Never  Vaping Use   Vaping status: Never Used  Substance and Sexual Activity   Alcohol use: Yes    Alcohol/week: 0.0 standard drinks of alcohol    Comment: occasionally   Drug use: No   Sexual activity: Never  Other Topics Concern   Not on file  Social History Narrative   Lives with spouse elsie in an apt.    Social Drivers of Corporate investment banker Strain: Low Risk  (11/14/2023)   Received from [facility]   Overall Financial Resource Strain (CARDIA)    Difficulty of Paying Living Expenses:  Not hard at all  Food Insecurity: No Food Insecurity (11/14/2023)   [facility]   Hunger Vital Sign    Within the past 12 months, you worried that your food would run out before you got  the money to buy more.: Never true    Within the past 12 months, the food you bought just didn't last and you didn't have money to get more.: Never true  Transportation Needs: No Transportation Needs (11/14/2023)   Received from [facility] - Transportation    In the past 12 months, has lack of transportation kept you from medical appointments or from getting medications?: No    Lack of Transportation (Non-Medical): No  Physical Activity: Not on file  Stress: Not on file  Social Connections: Not on file  Intimate Partner Violence: Not At Risk (08/25/2023)   Humiliation, Afraid, Rape, and Kick questionnaire    Fear of Current or Ex-Partner: No    Emotionally Abused: No    Physically Abused: No    Sexually Abused: No    PHYSICAL EXAM  GENERAL EXAM/CONSTITUTIONAL: Vitals:  Vitals:   03/24/24 1155  BP: (!) 141/61  Pulse: 71  Resp: 15  SpO2: 91%  Height: 5' 1 (1.549 m)     Body mass index is 24.3 kg/m. Wt Readings from Last 3 Encounters:  02/14/24 128 lb 9.6 oz (58.3 kg)  12/25/23 127 lb (57.6 kg)  11/26/23 126 lb (57.2 kg)   Patient is in no distress; well developed, nourished and groomed; neck is supple  MUSCULOSKELETAL: Gait, strength, tone, movements noted in Neurologic exam below  NEUROLOGIC: MENTAL STATUS:      No data to display         awake, alert, oriented to person, place and time recent and remote memory intact normal attention and concentration language fluent, comprehension intact, naming intact fund of knowledge appropriate  CRANIAL NERVE:  2nd, 3rd, 4th, 6th - visual fields full to confrontation, extraocular muscles intact, no nystagmus 5th - facial sensation symmetric 7th - facial strength  symmetric 8th - hearing intact 9th - palate elevates symmetrically, uvula midline 11th - shoulder shrug symmetric 12th - tongue protrusion midline  MOTOR:  normal bulk and tone, full strength in the BUE, BLE.    SENSORY:  normal and symmetric to light touch  COORDINATION:  finger-nose-finger, fine finger movements normal  GAIT/STATION:  Uses a rolling walker    DIAGNOSTIC DATA (LABS, IMAGING, TESTING) - I reviewed patient records, labs, notes, testing and imaging myself where available.  Lab Results  Component Value Date   WBC 25.0 (H) 11/26/2023   HGB 15.3 (H) 11/26/2023   HCT 48.1 (H) 11/26/2023   MCV 102.3 (H) 11/26/2023   PLT 513 (H) 11/26/2023      Component Value Date/Time   NA 137 11/26/2023 1129   NA 137 12/07/2012 1641   K 4.4 11/26/2023 1129   K 4.2 12/07/2012 1641   CL 95 (L) 11/26/2023 1129   CL 106 12/07/2012 1641   CO2 24 11/26/2023 1129   CO2 27 12/07/2012 1641   GLUCOSE 174 (H) 11/26/2023 1129   GLUCOSE 75 12/07/2012 1641   BUN 54 (H) 11/26/2023 1129   BUN 18 12/07/2012 1641   CREATININE 6.87 (H) 11/26/2023 1129   CREATININE 1.13 12/07/2012 1641   CALCIUM  7.2 (L) 11/26/2023 1129   CALCIUM  8.9 12/07/2012 1641   PROT 8.0 08/24/2023 1332   PROT 8.6 (H) 12/07/2012 1641   ALBUMIN  3.2 (L) 08/24/2023 1332   ALBUMIN  3.2 (L) 12/07/2012 1641   AST 29 08/24/2023 1332   AST 37 12/07/2012 1641   ALT 17 08/24/2023 1332   ALT 33 12/07/2012 1641   ALKPHOS 98 08/24/2023 1332  ALKPHOS 72 12/07/2012 1641   BILITOT 0.9 08/24/2023 1332   BILITOT 0.4 12/07/2012 1641   GFRNONAA 5 (L) 11/26/2023 1129   GFRNONAA 45 (L) 12/07/2012 1641   GFRAA 25 (L) 03/30/2020 1026   GFRAA 52 (L) 12/07/2012 1641   Lab Results  Component Value Date   CHOL 176 10/13/2021   HDL 44 10/13/2021   LDLCALC 115 (H) 10/13/2021   TRIG 83 10/13/2021   CHOLHDL 4.0 10/13/2021   Lab Results  Component Value Date   HGBA1C 6.4 (H) 10/13/2021   HGBA1C 6.4 (H) 10/13/2021   Lab  Results  Component Value Date   VITAMINB12 324 10/13/2021   Lab Results  Component Value Date   TSH 12.489 (H) 10/13/2021    MRI HEAD IMPRESSION: 1. Patchy small volume acute ischemic infarcts involving the right basal ganglia, right occipital lobe, and right cerebellar hemisphere as above. No associated hemorrhage or mass effect. 2. Underlying age-related cerebral atrophy with mild chronic small vessel ischemic disease.   MRA HEAD IMPRESSION: 1. Technically limited exam due to motion artifact. 2. 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 Attenuated flow within the distal right V4 segment suspected to be retrograde in nature across the vertebrobasilar junction. 3. Severe distal right P2/P3 stenosis. 4. Otherwise grossly patent intracranial circulation. No other hemodynamically significant or correctable stenosis.   MRA NECK IMPRESSION: 1. Severely limited and nearly nondiagnostic study due to extensive motion artifact. 2. Possible focal severe high-grade stenosis involving the proximal cervical right ICA. Finding not entirely certain and may simply be due to vascular tortuosity given the technical limitations of this exam. Correlation with carotid Doppler ultrasound and/or CTA could be performed for further evaluation as warranted. 3. No appreciable flow-limiting stenosis about the left carotid artery system on this limited exam. 4. Left vertebral artery dominant and grossly patent within the neck. Attenuated and irregular flow within the diminutive right vertebral artery. Again, the right vertebral artery appears to largely occluded at the skull base are corresponding intracranial MRA.   Carotid US   1. Elevated peak systolic velocity in the left internal carotid artery indicative of 50-69% stenosis. 2. Less than 50% stenosis of the right internal carotid artery.  ASSESSMENT AND PLAN  89 y.o. year old female with vascular risk factors including  hypertension, hyperlipidemia, diabetes mellitus, hypothyroidism, RLS and neuropathy who is presenting for follow up. For her RLS, her symptoms are stable with Ropinirole  0.5 mg twice daily. Her main complaint today is neuropathic pain worse during dialysis. She is already on Gabapentin , advised her to use Lidocaine  cream or EMLA  on dialysis day. If not enough to control the pain, we will consider Tramadol . Continue to follow up with PCP and return in 6 to 8 months or sooner if worse.     1. Restless leg syndrome   2. Peripheral polyneuropathy      Patient Instructions  Continue with current medications  Ropinirole  0.5 mg twice daily  Gabapentin  100/100/200 and to take 200/200 after dialysis  Trial of EMLA  cream or Lidocaine  cream on legs on dialysis days. Please call me if EMLA  or Lidocaine  cream is not controlling your pain At that time we will consider Tramadol   Return in 6 to 8 months or sooner if worse   No orders of the defined types were placed in this encounter.   Meds ordered this encounter  Medications   lidocaine -prilocaine  (EMLA ) cream    Sig: Apply 1 Application topically as needed.  Dispense:  30 g    Refill:  6   traZODone (DESYREL) 50 MG tablet    Sig: Take 1 tablet (50 mg total) by mouth at bedtime.    Dispense:  30 tablet    Refill:  0    Return in about 8 months (around 11/22/2024).   Pastor Falling, MD 03/24/2024, 2:20 PM  [facility] [address] [place], [place] [address] [phone]

## 2024-03-24 NOTE — Patient Instructions (Addendum)
 Continue with current medications  Ropinirole  0.5 mg twice daily  Gabapentin  100/100/200 and to take 200/200 after dialysis  Trial of EMLA  cream or Lidocaine  cream on legs on dialysis days. Please call me if EMLA  or Lidocaine  cream is not controlling your pain At that time we will consider Tramadol   Return in 6 to 8 months or sooner if worse

## 2024-04-08 ENCOUNTER — Emergency Department

## 2024-04-08 ENCOUNTER — Other Ambulatory Visit: Payer: Self-pay

## 2024-04-08 ENCOUNTER — Inpatient Hospital Stay
Admission: EM | Admit: 2024-04-08 | Discharge: 2024-04-10 | DRG: 189 | Disposition: A | Discharge status: Home / Self Care | Attending: Hospitalist | Admitting: Hospitalist

## 2024-04-08 DIAGNOSIS — G2581 Restless legs syndrome: Secondary | ICD-10-CM | POA: Diagnosis present

## 2024-04-08 DIAGNOSIS — R0603 Acute respiratory distress: Principal | ICD-10-CM

## 2024-04-08 DIAGNOSIS — Z79890 Hormone replacement therapy (postmenopausal): Secondary | ICD-10-CM | POA: Diagnosis not present

## 2024-04-08 DIAGNOSIS — D72828 Other elevated white blood cell count: Secondary | ICD-10-CM | POA: Diagnosis present

## 2024-04-08 DIAGNOSIS — J9601 Acute respiratory failure with hypoxia: Secondary | ICD-10-CM | POA: Diagnosis present

## 2024-04-08 DIAGNOSIS — Z90721 Acquired absence of ovaries, unilateral: Secondary | ICD-10-CM

## 2024-04-08 DIAGNOSIS — Z7982 Long term (current) use of aspirin: Secondary | ICD-10-CM | POA: Diagnosis not present

## 2024-04-08 DIAGNOSIS — I132 Hypertensive heart and chronic kidney disease with heart failure and with stage 5 chronic kidney disease, or end stage renal disease: Secondary | ICD-10-CM | POA: Diagnosis present

## 2024-04-08 DIAGNOSIS — N2581 Secondary hyperparathyroidism of renal origin: Secondary | ICD-10-CM | POA: Diagnosis present

## 2024-04-08 DIAGNOSIS — H409 Unspecified glaucoma: Secondary | ICD-10-CM | POA: Diagnosis present

## 2024-04-08 DIAGNOSIS — Z8249 Family history of ischemic heart disease and other diseases of the circulatory system: Secondary | ICD-10-CM

## 2024-04-08 DIAGNOSIS — E1122 Type 2 diabetes mellitus with diabetic chronic kidney disease: Secondary | ICD-10-CM | POA: Diagnosis present

## 2024-04-08 DIAGNOSIS — Z87891 Personal history of nicotine dependence: Secondary | ICD-10-CM

## 2024-04-08 DIAGNOSIS — E039 Hypothyroidism, unspecified: Secondary | ICD-10-CM | POA: Diagnosis present

## 2024-04-08 DIAGNOSIS — N186 End stage renal disease: Secondary | ICD-10-CM | POA: Diagnosis present

## 2024-04-08 DIAGNOSIS — J9621 Acute and chronic respiratory failure with hypoxia: Secondary | ICD-10-CM | POA: Diagnosis present

## 2024-04-08 DIAGNOSIS — D631 Anemia in chronic kidney disease: Secondary | ICD-10-CM | POA: Diagnosis present

## 2024-04-08 DIAGNOSIS — E7800 Pure hypercholesterolemia, unspecified: Secondary | ICD-10-CM | POA: Diagnosis present

## 2024-04-08 DIAGNOSIS — Z885 Allergy status to narcotic agent status: Secondary | ICD-10-CM

## 2024-04-08 DIAGNOSIS — I5032 Chronic diastolic (congestive) heart failure: Secondary | ICD-10-CM | POA: Diagnosis present

## 2024-04-08 DIAGNOSIS — Z880 Allergy status to penicillin: Secondary | ICD-10-CM | POA: Diagnosis not present

## 2024-04-08 DIAGNOSIS — Z79899 Other long term (current) drug therapy: Secondary | ICD-10-CM

## 2024-04-08 DIAGNOSIS — Z888 Allergy status to other drugs, medicaments and biological substances status: Secondary | ICD-10-CM | POA: Diagnosis not present

## 2024-04-08 DIAGNOSIS — Z992 Dependence on renal dialysis: Secondary | ICD-10-CM | POA: Diagnosis not present

## 2024-04-08 DIAGNOSIS — Z955 Presence of coronary angioplasty implant and graft: Secondary | ICD-10-CM

## 2024-04-08 DIAGNOSIS — I2510 Atherosclerotic heart disease of native coronary artery without angina pectoris: Secondary | ICD-10-CM | POA: Diagnosis present

## 2024-04-08 DIAGNOSIS — D751 Secondary polycythemia: Secondary | ICD-10-CM | POA: Diagnosis present

## 2024-04-08 DIAGNOSIS — Z90710 Acquired absence of both cervix and uterus: Secondary | ICD-10-CM

## 2024-04-08 DIAGNOSIS — J810 Acute pulmonary edema: Principal | ICD-10-CM | POA: Diagnosis present

## 2024-04-08 LAB — COMPREHENSIVE METABOLIC PANEL WITH GFR
ALT: 13 U/L (ref 0–44)
AST: 26 U/L (ref 15–41)
Albumin: 3 g/dL — ABNORMAL LOW (ref 3.5–5.0)
Alkaline Phosphatase: 70 U/L (ref 38–126)
Anion gap: 13 (ref 5–15)
BUN: 42 mg/dL — ABNORMAL HIGH (ref 8–23)
CO2: 28 mmol/L (ref 22–32)
Calcium: 8.1 mg/dL — ABNORMAL LOW (ref 8.9–10.3)
Chloride: 97 mmol/L — ABNORMAL LOW (ref 98–111)
Creatinine, Ser: 7.32 mg/dL — ABNORMAL HIGH (ref 0.44–1.00)
GFR, Estimated: 5 mL/min — ABNORMAL LOW (ref >60)
Glucose, Bld: 115 mg/dL — ABNORMAL HIGH (ref 70–99)
Potassium: 5.1 mmol/L (ref 3.5–5.1)
Sodium: 138 mmol/L (ref 135–145)
Total Bilirubin: 1.2 mg/dL (ref 0.0–1.2)
Total Protein: 7.4 g/dL (ref 6.5–8.1)

## 2024-04-08 LAB — CBC WITH DIFFERENTIAL/PLATELET
Abs Immature Granulocytes: 0.2 K/uL — ABNORMAL HIGH (ref 0.00–0.07)
Basophils Absolute: 0.1 K/uL (ref 0.0–0.1)
Basophils Relative: 1 %
Eosinophils Absolute: 0.3 K/uL (ref 0.0–0.5)
Eosinophils Relative: 1 %
HCT: 45.4 % (ref 36.0–46.0)
Hemoglobin: 14.1 g/dL (ref 12.0–15.0)
Immature Granulocytes: 1 %
Lymphocytes Relative: 9 %
Lymphs Abs: 1.7 K/uL (ref 0.7–4.0)
MCH: 31.2 pg (ref 26.0–34.0)
MCHC: 31.1 g/dL (ref 30.0–36.0)
MCV: 100.4 fL — ABNORMAL HIGH (ref 80.0–100.0)
Monocytes Absolute: 1.7 K/uL — ABNORMAL HIGH (ref 0.1–1.0)
Monocytes Relative: 9 %
Neutro Abs: 15.4 K/uL — ABNORMAL HIGH (ref 1.7–7.7)
Neutrophils Relative %: 79 %
Platelets: 668 K/uL — ABNORMAL HIGH (ref 150–400)
RBC: 4.52 MIL/uL (ref 3.87–5.11)
RDW: 15.6 % — ABNORMAL HIGH (ref 11.5–15.5)
WBC: 19.4 K/uL — ABNORMAL HIGH (ref 4.0–10.5)
nRBC: 0 % (ref 0.0–0.2)

## 2024-04-08 LAB — BRAIN NATRIURETIC PEPTIDE: B Natriuretic Peptide: 905.4 pg/mL — ABNORMAL HIGH (ref 0.0–100.0)

## 2024-04-08 LAB — HEPATITIS B SURFACE ANTIGEN: Hepatitis B Surface Ag: NONREACTIVE

## 2024-04-08 LAB — BLOOD GAS, VENOUS
Acid-base deficit: 1.8 mmol/L (ref 0.0–2.0)
Bicarbonate: 24.7 mmol/L (ref 20.0–28.0)
O2 Saturation: 93.6 %
Patient temperature: 37
pCO2, Ven: 48 mmHg (ref 44–60)
pH, Ven: 7.32 (ref 7.25–7.43)
pO2, Ven: 67 mmHg — ABNORMAL HIGH (ref 32–45)

## 2024-04-08 LAB — PROCALCITONIN: Procalcitonin: 0.52 ng/mL

## 2024-04-08 LAB — TROPONIN I (HIGH SENSITIVITY)
Troponin I (High Sensitivity): 53 ng/L — ABNORMAL HIGH (ref <18)
Troponin I (High Sensitivity): 69 ng/L — ABNORMAL HIGH (ref <18)

## 2024-04-08 MED ORDER — ALPRAZOLAM 0.25 MG PO TABS
0.2500 mg | ORAL_TABLET | Freq: Every day | ORAL | Status: DC | PRN
  Administered 2024-04-08 – 2024-04-10 (×2): 0.25 mg via ORAL
  Filled 2024-04-08 (×2): qty 1

## 2024-04-08 MED ORDER — NEPRO/CARBSTEADY PO LIQD: 237.0000 mL | ORAL | Status: DC | PRN

## 2024-04-08 MED ORDER — ROPINIROLE HCL 1 MG PO TABS
0.5000 mg | ORAL_TABLET | Freq: Two times a day (BID) | ORAL | Status: DC | PRN
  Administered 2024-04-08 – 2024-04-09 (×2): 0.5 mg via ORAL
  Filled 2024-04-08 (×2): qty 1

## 2024-04-08 MED ORDER — HEPARIN SODIUM (PORCINE) 1000 UNIT/ML DIALYSIS
20.0000 [IU]/kg | INTRAMUSCULAR | Status: DC | PRN
  Administered 2024-04-08: 1200 [IU] via INTRAVENOUS_CENTRAL
  Filled 2024-04-08: qty 2

## 2024-04-08 MED ORDER — HEPARIN SODIUM (PORCINE) 1000 UNIT/ML DIALYSIS
1000.0000 [IU] | INTRAMUSCULAR | Status: DC | PRN
  Filled 2024-04-08: qty 1

## 2024-04-08 MED ORDER — PENTAFLUOROPROP-TETRAFLUOROETH EX AERO
1.0000 | INHALATION_SPRAY | CUTANEOUS | Status: DC | PRN
  Filled 2024-04-08: qty 30

## 2024-04-08 MED ORDER — FUROSEMIDE 10 MG/ML IJ SOLN
80.0000 mg | Freq: Once | INTRAMUSCULAR | Status: AC
  Administered 2024-04-08: 80 mg via INTRAVENOUS
  Filled 2024-04-08: qty 8

## 2024-04-08 MED ORDER — CALCIUM ACETATE (PHOS BINDER) 667 MG PO CAPS
1334.0000 mg | ORAL_CAPSULE | Freq: Three times a day (TID) | ORAL | Status: DC
  Administered 2024-04-08 – 2024-04-10 (×5): 1334 mg via ORAL
  Filled 2024-04-08 (×5): qty 2

## 2024-04-08 MED ORDER — ASPIRIN 81 MG PO TBEC
81.0000 mg | DELAYED_RELEASE_TABLET | Freq: Every day | ORAL | Status: DC
  Administered 2024-04-08 – 2024-04-10 (×3): 81 mg via ORAL
  Filled 2024-04-08 (×3): qty 1

## 2024-04-08 MED ORDER — LEVOTHYROXINE SODIUM 88 MCG PO TABS
88.0000 ug | ORAL_TABLET | Freq: Every day | ORAL | Status: DC
  Administered 2024-04-09 – 2024-04-10 (×2): 88 ug via ORAL
  Filled 2024-04-08 (×2): qty 1

## 2024-04-08 MED ORDER — NEPRO/CARBSTEADY PO LIQD
237.0000 mL | Freq: Two times a day (BID) | ORAL | Status: DC
  Administered 2024-04-09 – 2024-04-10 (×2): 237 mL via ORAL

## 2024-04-08 MED ORDER — TIMOLOL MALEATE 0.5 % OP SOLN
1.0000 [drp] | Freq: Every morning | OPHTHALMIC | Status: DC
  Administered 2024-04-09: 1 [drp] via OPHTHALMIC
  Filled 2024-04-08 (×2): qty 5

## 2024-04-08 MED ORDER — ANTICOAGULANT SODIUM CITRATE 4% (200MG/5ML) IV SOLN
5.0000 mL | Status: DC | PRN
  Filled 2024-04-08: qty 5

## 2024-04-08 MED ORDER — CHLORHEXIDINE GLUCONATE CLOTH 2 % EX PADS
6.0000 | MEDICATED_PAD | Freq: Every day | CUTANEOUS | Status: DC
  Administered 2024-04-09 – 2024-04-10 (×2): 6 via TOPICAL
  Filled 2024-04-08: qty 6

## 2024-04-08 MED ORDER — HEPARIN SODIUM (PORCINE) 5000 UNIT/ML IJ SOLN
5000.0000 [IU] | Freq: Three times a day (TID) | INTRAMUSCULAR | Status: DC
  Administered 2024-04-08 – 2024-04-10 (×6): 5000 [IU] via SUBCUTANEOUS
  Filled 2024-04-08 (×6): qty 1

## 2024-04-08 MED ORDER — LIDOCAINE HCL (PF) 1 % IJ SOLN
5.0000 mL | INTRAMUSCULAR | Status: DC | PRN
  Filled 2024-04-08: qty 5

## 2024-04-08 MED ORDER — HEPARIN SODIUM (PORCINE) 1000 UNIT/ML IJ SOLN
INTRAMUSCULAR | Status: AC
  Filled 2024-04-08: qty 10

## 2024-04-08 MED ORDER — ALTEPLASE 2 MG IJ SOLR
2.0000 mg | Freq: Once | INTRAMUSCULAR | Status: DC | PRN
  Filled 2024-04-08: qty 2

## 2024-04-08 MED ORDER — HYDROCODONE-ACETAMINOPHEN 5-325 MG PO TABS
1.0000 | ORAL_TABLET | Freq: Three times a day (TID) | ORAL | Status: DC | PRN
  Administered 2024-04-08 – 2024-04-10 (×3): 1 via ORAL
  Filled 2024-04-08 (×3): qty 1

## 2024-04-08 MED ORDER — NITROGLYCERIN 2 % TD OINT
1.0000 [in_us] | TOPICAL_OINTMENT | Freq: Once | TRANSDERMAL | Status: AC
  Administered 2024-04-08: 1 [in_us] via TOPICAL
  Filled 2024-04-08: qty 1

## 2024-04-08 MED ORDER — LIDOCAINE-PRILOCAINE 2.5-2.5 % EX CREA
1.0000 | TOPICAL_CREAM | CUTANEOUS | Status: DC | PRN
  Filled 2024-04-08: qty 5

## 2024-04-08 NOTE — ED Notes (Signed)
 Dialysis reports removing .

## 2024-04-08 NOTE — Plan of Care (Signed)

## 2024-04-08 NOTE — ED Triage Notes (Signed)
 Patient arrives from home by [facility] in respiratory distress.  Patient was at home found to be guppy breathing.  Patient was 78% on 15 liters non rebreather mask, and increased to 85% on EMS C-Pap.  Patient's family said she had been like this most of the night.

## 2024-04-08 NOTE — ED Notes (Signed)
 Pt transported to dialysis by Transport [facility] Respiratory.

## 2024-04-08 NOTE — ED Provider Notes (Addendum)
 [facility] Provider Note    Event Date/Time   First MD Initiated Contact with Patient 04/08/24 0602     (approximate)   History   Respiratory Distress   HPI  Cassandra [name] is a 89 y.o. female   Past medical history of end-stage renal disease on hemodialysis Tuesday Thursday Friday, CAD, hypothyroid, hypertension and hyperlipidemia, type II diabetic, makes a little bit of urine, who presents in respiratory distress.  Was feeling well yesterday and awoke with shortness of breath.  Denies any recent respiratory infectious symptoms.  Denies any chest pain or pain anywhere in her body.  Has been fully compliant with her dialysis and is due for her next session this morning.  Independent Historian contributed to assessment above: EMS gives report as above, noted to be hypoxemic to the 70% range on room air.  Put on CPAP with subjective improvement.       Physical Exam   Triage Vital Signs: ED Triage Vitals  Encounter Vitals Group     BP 04/08/24 0558 (!) 167/74     Girls Systolic BP Percentile --      Girls Diastolic BP Percentile --      Boys Systolic BP Percentile --      Boys Diastolic BP Percentile --      Pulse Rate 04/08/24 0558 97     Resp 04/08/24 0558 (!) 24     Temp 04/08/24 0558 97.6 F (36.4 C)     Temp Source 04/08/24 0558 Axillary     SpO2 04/08/24 0558 96 %     Weight --      Height --      Head Circumference --      Peak Flow --      Pain Score 04/08/24 0559 0     Pain Loc --      Pain Education --      Exclude from Growth Chart --     Most recent vital signs: Vitals:   04/08/24 0600 04/08/24 0630  BP:  (!) 125/51  Pulse: 95 83  Resp:  20  Temp:    SpO2: 100% 100%    General: Awake, no distress.  CV:  Good peripheral perfusion.  Resp:  Normal effort.  Abd:  No distention.  Other:  Hypertensive, tachypneic, respiratory distress.  Speaking in short sentences.  On a BiPAP machine.  Rales to bilateral lungs without  focality.  Bedside ultrasound shows B-lines diffusely in the plethoric IVC.  Soft nontender abdomen.  Afebrile.   ED Results / Procedures / Treatments   Labs (all labs ordered are listed, but only abnormal results are displayed) Labs Reviewed  COMPREHENSIVE METABOLIC PANEL WITH GFR - Abnormal; Notable for the following components:      Result Value   Chloride 97 (*)    Glucose, Bld 115 (*)    BUN 42 (*)    Creatinine, Ser 7.32 (*)    Calcium  8.1 (*)    Albumin  3.0 (*)    GFR, Estimated 5 (*)    All other components within normal limits  CBC WITH DIFFERENTIAL/PLATELET - Abnormal; Notable for the following components:   MCV 100.4 (*)    RDW 15.6 (*)    Platelets 668 (*)    All other components within normal limits  BLOOD GAS, VENOUS - Abnormal; Notable for the following components:   pO2, Ven 67 (*)    All other components within normal limits  TROPONIN I (HIGH SENSITIVITY) -  Abnormal; Notable for the following components:   Troponin I (High Sensitivity) 53 (*)    All other components within normal limits  HEPATITIS B SURFACE ANTIGEN  HEPATITIS B SURFACE ANTIBODY, QUANTITATIVE  BRAIN NATRIURETIC PEPTIDE     I ordered and reviewed the above labs they are notable for H&H at baseline.  EKG  ED ECG REPORT I, Ginnie Shams, the attending physician, personally viewed and interpreted this ECG.   Date: 04/08/2024  EKG Time: 0610  Rate: 89  Rhythm: sinus  Axis: nl  Intervals:nl  ST&T Change: no stemi    PROCEDURES:  Critical Care performed: Yes, see critical care procedure note(s)  .Critical Care  Performed by: Shams Ginnie, MD Authorized by: Shams Ginnie, MD   Critical care provider statement:    Critical care time (minutes):  35   Critical care was time spent personally by me on the following activities:  Development of treatment plan with patient or surrogate, discussions with consultants, evaluation of patient's response to treatment, examination of patient, ordering  and review of laboratory studies, ordering and review of radiographic studies, ordering and performing treatments and interventions, pulse oximetry, re-evaluation of patient's condition and review of old charts    MEDICATIONS ORDERED IN ED: Medications  Chlorhexidine  Gluconate Cloth 2 % PADS 6 each (has no administration in time range)  nitroGLYCERIN  (NITROGLYN) 2 % ointment 1 inch (1 inch Topical Given 04/08/24 0615)  furosemide  (LASIX ) injection 80 mg (80 mg Intravenous Given 04/08/24 0615)    External physician / consultants:  I spoke with nephrologist Dr. Dennise regarding care plan for this patient.   IMPRESSION / MDM / ASSESSMENT AND PLAN / ED COURSE  I reviewed the triage vital signs and the nursing notes.                                Patient's presentation is most consistent with acute presentation with potential threat to life or bodily function.  Differential diagnosis includes, but is not limited to, flash pulmonary edema, respiratory infection, CHF exacerbation, COPD exacerbation, ACS, PE   The patient is on the cardiac monitor to evaluate for evidence of arrhythmia and/or significant heart rate changes.  MDM:    I think she has flash pulmonary edema in the setting of her end-stage renal disease and she has evidence of fluid overload on clinical exam and ultrasound examination and chest x-ray also looks like pulmonary edema.  Will give Nitropaste, Lasix , keep on BiPAP, and I have consulted with Dr. Dennise of nephrology to inform of her arrival to the emergency department and request dialysis.  Check basic labs including electrolytes, EKG, chest x-ray.  Admission.  Fortunately electrolytes look okay, potassium within normal limits.  Patient is reassessed at this time breathing much more comfortably on her BiPAP, hemodynamic stable blood pressure come down to 120s over 50s with the Nitropaste.  Hospitalist paged for admission.       FINAL CLINICAL IMPRESSION(S) / ED  DIAGNOSES   Final diagnoses:  Respiratory distress  Acute hypoxemic respiratory failure (HCC)  Flash pulmonary edema (HCC)     Rx / DC Orders   ED Discharge Orders     None        Note:  This document was prepared using Dragon voice recognition software and may include unintentional dictation errors.    Shams Ginnie, MD 04/08/24 [id number]    Shams Ginnie, MD 04/08/24 [phone]

## 2024-04-08 NOTE — ED Notes (Signed)
 Respiratory at bedside to take Pt to dialysis.  Transport requested again.

## 2024-04-08 NOTE — ED Notes (Signed)
 Pt sat noted to be 98% on 5L [place].  O2 turned down to 3L and Pt maintaining at 94%.  Hospitalist notified.

## 2024-04-08 NOTE — ED Notes (Signed)
 Pt given a tray by Nurse Secretary per request.

## 2024-04-08 NOTE — Procedures (Signed)
 HD note  Patient completed treatment. 3.5 hour treatment 1900 ml removed  Patient on BiPAP.  Patient alert and oriented.

## 2024-04-08 NOTE — ED Notes (Signed)
 Hospitalist at bedside

## 2024-04-08 NOTE — Progress Notes (Signed)
 [facility]  ROUNDING NOTE   Subjective:   Cassandra [name] is a 89 y.o. female known to our practice from outpatient dialysis.  She dialyzes at [facility] TTS first shift.  Patient presents to the emergency department in respiratory distress, placed on CPAP by EMS due to decreased saturations on 15 L, 78%.  Patient is currently admitted for Respiratory distress [R06.03] Flash pulmonary edema (HCC) [J81.0] Acute hypoxemic respiratory failure (HCC) [J96.01]  Patient denies any recently missed treatments.  Patient is seen and evaluated on dialysis.   HEMODIALYSIS FLOWSHEET:  Blood Flow Rate (mL/min): 299 mL/min Arterial Pressure (mmHg): -258.57 mmHg Venous Pressure (mmHg): 177.97 mmHg TMP (mmHg): -1.41 mmHg Ultrafiltration Rate (mL/min): 645 mL/min Dialysate Flow Rate (mL/min): 300 ml/min  BiPAP remains in place.  Patient currently alert and oriented.  States she felt fine when she went to bed.  Remembers waking up in the emergency department.  Labs on ED arrival concerning for BUN 42, creatinine 7.32 with GFR 5, BNP 905, troponin 53, and white count 19.4.  Chest x-ray shows bilateral pleural effusions, left greater than right, interstitial markings concerning for pulmonary edema versus atypical infection.  We have been consulted to provide urgent dialysis.  Objective:  Vital signs in last 24 hours:  Temp:  [97.6 F (36.4 C)-99.2 F (37.3 C)] 99 F (37.2 C) (07/15 1405) Pulse Rate:  [66-97] 82 (07/15 1500) Resp:  [17-30] 20 (07/15 1500) BP: (91-167)/(50-91) 141/54 (07/15 1500) SpO2:  [90 %-100 %] 91 % (07/15 1500) FiO2 (%):  [60 %-100 %] 60 % (07/15 1253) Weight:  [58 kg-58.5 kg] 58 kg (07/15 0815)  Weight change:  Filed Weights   04/08/24 0800 04/08/24 0815  Weight: 58.5 kg 58 kg    Intake/Output: No intake/output data recorded.   Intake/Output this shift:  Total I/O In: -  Out: 1900 [Other:1900]  Physical Exam: General: Ill-appearing  Head:  Normocephalic, atraumatic.  Dry oral mucosal membranes  Eyes: Anicteric  Neck: Supple  Lungs:  Bilateral rales, BiPAP  Heart: Regular rate and rhythm  Abdomen:  Soft, nontender  Extremities: Trace peripheral edema.  Neurologic: Awake, alert, conversant  Skin: Warm,dry, no rash  Access: Left upper aVF    Basic Metabolic Panel: Recent Labs  Lab 04/08/24 0605  NA 138  K 5.1  CL 97*  CO2 28  GLUCOSE 115*  BUN 42*  CREATININE 7.32*  CALCIUM  8.1*    Liver Function Tests: Recent Labs  Lab 04/08/24 0605  AST 26  ALT 13  ALKPHOS 70  BILITOT 1.2  PROT 7.4  ALBUMIN  3.0*   No results for input(s): LIPASE, AMYLASE in the last 168 hours. No results for input(s): AMMONIA in the last 168 hours.  CBC: Recent Labs  Lab 04/08/24 0605  WBC 19.4*  NEUTROABS 15.4*  HGB 14.1  HCT 45.4  MCV 100.4*  PLT 668*    Cardiac Enzymes: No results for input(s): CKTOTAL, CKMB, CKMBINDEX, TROPONINI in the last 168 hours.  BNP: Invalid input(s): POCBNP  CBG: No results for input(s): GLUCAP in the last 168 hours.  Microbiology: Results for orders placed or performed during the hospital encounter of 08/24/23  Blood culture (routine x 2)     Status: None   Collection Time: 08/24/23  5:21 PM   Specimen: BLOOD  Result Value Ref Range Status   Specimen Description BLOOD BLOOD RIGHT FOREARM  Final   Special Requests   Final    BOTTLES DRAWN AEROBIC AND ANAEROBIC Blood Culture adequate  volume   Culture   Final    NO GROWTH 5 DAYS Performed at [facility], [address] [place], [place] [address]    Report Status 08/29/2023 FINAL  Final  Blood culture (routine x 2)     Status: None   Collection Time: 08/24/23  8:36 PM   Specimen: BLOOD RIGHT ARM  Result Value Ref Range Status   Specimen Description BLOOD RIGHT ARM  Final   Special Requests   Final    BOTTLES DRAWN AEROBIC AND ANAEROBIC Blood Culture results may not be optimal due to an excessive  volume of blood received in culture bottles   Culture   Final    NO GROWTH 5 DAYS Performed at [facility], [address], [place], [place] [address]    Report Status 08/29/2023 FINAL  Final  MRSA Next Gen by PCR, Nasal     Status: None   Collection Time: 08/25/23  5:38 PM   Specimen: Nasal Mucosa; Nasal Swab  Result Value Ref Range Status   MRSA by PCR Next Gen NOT DETECTED NOT DETECTED Final    Comment: (NOTE) The GeneXpert MRSA Assay (FDA approved for NASAL specimens only), is one component of a comprehensive MRSA colonization surveillance program. It is not intended to diagnose MRSA infection nor to guide or monitor treatment for MRSA infections. Test performance is not FDA approved in patients less than 5 years old. Performed at [facility], [address] [place], [place] [address]     Coagulation Studies: No results for input(s): LABPROT, INR in the last 72 hours.  Urinalysis: No results for input(s): COLORURINE, LABSPEC, PHURINE, GLUCOSEU, HGBUR, BILIRUBINUR, KETONESUR, PROTEINUR, UROBILINOGEN, NITRITE, LEUKOCYTESUR in the last 72 hours.  Invalid input(s): APPERANCEUR    Imaging: DG Chest Port 1 View Result Date: 04/08/2024 CLINICAL DATA:  Shortness of breath and respiratory distress. EXAM: PORTABLE CHEST 1 VIEW COMPARISON:  08/24/2023 FINDINGS: Stable cardiomediastinal contours. Aortic atherosclerotic calcifications. Bilateral pleural effusions with blunting of the costophrenic angles noted, left greater than right. Diffuse increase interstitial markings identified bilaterally. Retrocardiac opacification may reflect left lower lobe atelectasis or consolidation. IMPRESSION: 1. Bilateral pleural effusions, left greater than right. 2. Diffuse increase interstitial markings compatible with pulmonary edema versus atypical infection. 3. Retrocardiac opacification may reflect left lower lobe atelectasis or consolidation.  Electronically Signed   By: Waddell Calk M.D.   On: 04/08/2024 07:18     Medications:     Chlorhexidine  Gluconate Cloth  6 each Topical Q0600     Assessment/ Plan:  [name] is a 89 y.o.  female with diabetes, hypertension, hypothyroidism, coronary artery disease, diastolic CHF, thrombocytosis and erythrocytosis, end-stage renal disease was admitted on 08/24/2023 for Respiratory distress [R06.03] Flash pulmonary edema (HCC) [J81.0] Acute hypoxemic respiratory failure (HCC) [J96.01]  [facility]/TTS/left aVF  End-stage renal disease on hemodialysis.  No recently missed treatments.  Will plan for urgent dialysis treatment today, UF goal 2 L as tolerated.  Will determine need for additional treatment tomorrow.  2.  Acute respiratory failure requiring CPAP on EMS arrival.Chest x-ray shows bilateral pleural effusions, left greater than right, interstitial markings concerning for pulmonary edema versus atypical infection.  Will attempt fluid removal with dialysis.  Will attempt to also weaned CPAP requirements during treatment.  3. Anemia of chronic kidney disease Lab Results  Component Value Date   HGB 14.1 04/08/2024    Hemoglobin within optimal range.  No need for ESA's.  3. Secondary Hyperparathyroidism: with outpatient  labs: PTH 445, phosphorus 6.9, calcium  7.9 on 03/05/24.   Lab Results  Component Value Date   CALCIUM  8.1 (L) 04/08/2024   CAION 1.11 (L) 05/25/2023   PHOS 6.1 (H) 03/13/2023    Will continue to monitor bone minerals during this admission.  Phosphorus slightly elevated.  Patient prescribed calcium  acetate with meals outpatient.  4.  Hypertension with chronic kidney disease.  Home regimen includes clonidine , furosemide , and metoprolol .   LOS: 0 Cassandra [name] 7/15/20253:25 PM

## 2024-04-08 NOTE — ED Provider Notes (Signed)
 Clinical Course as of 04/08/24 0746  Tue Apr 08, 2024  0718 Received signout, Bipap, Needs dialysis (Dr. Dennise aware). Ok for stepdown.  [HD]  0745 Hospitalist accepts admission [HD]    Clinical Course User Index [HD] Nicholaus Rolland BRAVO, MD      Nicholaus Rolland BRAVO, MD 04/08/24 [phone]

## 2024-04-08 NOTE — H&P (Signed)
 History and Physical    Ellenore Roscoe FMW:[id number] DOB: [birth year] DOA: 04/08/2024  PCP: Cleotilde Oneil FALCON, MD  Patient coming from: home  I have personally briefly reviewed patient's old medical records in [facility]  Chief Complaint: respiratory distress  HPI: Rhilyn Battle is a 89 y.o. female with medical history significant of ESRD on HD (TThSa), chronic leukocytosis, type 2 diabetes, hypertension, CAD s/p PCI 1999 who presented with acute respiratory distress and failure.  Pt reported she was at her usual state of health when she developed dyspnea last night.  The dyspnea continued to worsen so EMS was called this morning.  Per EMS, Patient was 78% on 15 liters non rebreather mask, and increased to 85% on EMS C-Pap.  Pt reported compliance with dialysis, medication, low salt diet and fluid restriction of 1.3L per day.  No recent URI or other illness.  No fever, chest pain, abdominal pain, N/V/D, dysuria, increased swelling.  ED Course: initial vitals: afebrile, pulse 97, BP 167/74, RR 24, on BiPAP.  Labs notable for BNP 905, WBC 19.4, CXR showed bilateral pleural effusions, and Diffuse increase interstitial markings.  Pt received IV Lasix  80 x1 in the ED and urgently taken to dialysis.  Assessment/Plan  Acute hypoxemic respiratory failure 2/2 Pulm edema and bilateral pleural effusions --possibly flash pulm edema or fluid buildup from under UF. --pt was off BiPAP after urgent dialysis and on 4L [place]. --dialysis for fluid removal.  HTN --BP elevated on presentation, however, decreased after dialysis without further antihypertensives. --monitor  ESRD on HD TTS --iHD per nephro  DM2 --recent A1c 6.4.  No hypoglycemics on home med list. --no need for BG checks  Chronic leukocytosis --no source of infection.  Respiratory status improved with just dialysis, so unlikely to be PNA.  CAD --cont home ASA --resume home statin after discharge.   DVT prophylaxis: Heparin   SQ Code Status: Full code  Family Communication: husband updated at bedside on admission  Disposition Plan: home  Consults called: Nephro Level of care: Progressive   Review of Systems: As per HPI otherwise complete review of systems negative.   Past Medical History:  Diagnosis Date   Anxiety    Aortic atherosclerosis (HCC)    Arthritis    osteoarthritis in hands, knees ( R torn cartilage, L meniscus removed)    Atherosclerosis of native coronary artery of native heart with unstable angina pectoris (HCC)    CAD (coronary artery disease) 1999   a.) s/p PCI (details unkown) in [place]  in 1999   Campylobacter antigen positive 11/29/2014   a.) presented to [facility] ED with N/V/D; stool culture (+)   ESRD on hemodialysis (HCC)    a.) T-Th-Sat   Glaucoma    Hypercholesterolemia    Hyperlipidemia    Hypertension    Hypothyroidism    Lumbar stenosis with neurogenic claudication    Perforation of colon as colonoscopy complication (HCC) 2010   a.) required short term colostomy with subsequent take down/reversal   RLS (restless legs syndrome)    Thrombocytosis    Type 2 diabetes mellitus with chronic kidney disease (HCC)     Past Surgical History:  Procedure Laterality Date   A/V FISTULAGRAM Left 12/25/2023   Procedure: A/V Fistulagram;  Surgeon: Jama Cordella MATSU, MD;  Location: [facility] INVASIVE CV LAB;  Service: Cardiovascular;  Laterality: Left;   ABDOMINAL HYSTERECTOMY     left ovary removed   AV FISTULA PLACEMENT Left 05/25/2023   Procedure: ARTERIOVENOUS (AV) FISTULA CREATION (BRACHIALCEPHALIC);  Surgeon: Jama Cordella MATSU, MD;  Location: [facility] ORS;  Service: Vascular;  Laterality: Left;   BLEPHAROPLASTY Bilateral    CATARACT EXTRACTION W/ INTRAOCULAR LENS  IMPLANT, BILATERAL     COLONOSCOPY  2010   COLOSTOMY  03/2009   perforated colon from colonoscopy   COLOSTOMY REVERSAL  08/2009   CORONARY ANGIOPLASTY WITH STENT PLACEMENT  1999   DIALYSIS/PERMA CATHETER INSERTION N/A  03/08/2023   Procedure: DIALYSIS/PERMA CATHETER INSERTION;  Surgeon: Marea Selinda RAMAN, MD;  Location: [facility] INVASIVE CV LAB;  Service: Cardiovascular;  Laterality: N/A;   DIALYSIS/PERMA CATHETER REMOVAL N/A 09/05/2023   Procedure: DIALYSIS/PERMA CATHETER REMOVAL;  Surgeon: Marea Selinda RAMAN, MD;  Location: [facility] INVASIVE CV LAB;  Service: Cardiovascular;  Laterality: N/A;   FOOT SURGERY Bilateral    toes correction   KNEE ARTHROSCOPY Right    torn cartilage   KNEE ARTHROSCOPY W/ MENISCECTOMY Left 01/2012   TEMPORARY DIALYSIS CATHETER N/A 03/05/2023   Procedure: TEMPORARY DIALYSIS CATHETER;  Surgeon: Marea Selinda RAMAN, MD;  Location: [facility] INVASIVE CV LAB;  Service: Cardiovascular;  Laterality: N/A;     reports that she quit smoking about 44 years ago. Her smoking use included cigarettes. She has never used smokeless tobacco. She reports current alcohol use. She reports that she does not use drugs.  Allergies  Allergen Reactions   Atorvastatin Other (See Comments)    MYALGIA   Cyclobenzaprine Other (See Comments)    hallucination   Hydrocodone -Acetaminophen      Other reaction(s): Hallucination when taking whole pill, pt tolerates taking 1/2 pill   Mirtazapine     Other reaction(s): Hallucination   Oxycodone-Acetaminophen  Other (See Comments)    hallucination   Paroxetine Hcl Other (See Comments)    hallucination   Penicillins Swelling    Lip and orbital swelling Tolerated 3rd generation cephalosporin (CEFTRIAXONE ) between 03/05/2023 and 03/09/2023 with no documented ADRs.   Propoxyphene Other (See Comments)    hallucination   Trazodone  Other (See Comments)    hallucination    Family History  Problem Relation Age of Onset   Hypertension Mother    Hypertension Father    Early death Father    Heart disease Father     Prior to Admission medications   Medication Sig Start Date End Date Taking? Authorizing Provider  ALPRAZolam  (XANAX ) 0.25 MG tablet Take 1 tablet (0.25 mg total) by mouth  daily as needed. 03/12/23   Marsa Edelman, DO  aspirin  EC 81 MG EC tablet Take 1 tablet (81 mg total) by mouth daily. Swallow whole. 10/16/21   Von Bellis, MD  calcium  acetate (PHOSLO ) 667 MG capsule Take 1,334 mg by mouth 3 (three) times daily. 06/30/23   [provider]  cetirizine (ZYRTEC) 10 MG tablet Take 1 tablet by mouth daily as needed for allergies.    [provider]  [employer] D3 25 MCG (1000 UT) capsule Take 1,000 Units by mouth daily. 02/21/23   [provider]  DROXIA  300 MG capsule TAKE 1 CAPSULE (300 MG TOTAL) BY MOUTH DAILY. MAY TAKE WITH FOOD TO MINIMIZE GI SIDE EFFECTS. 04/23/20   Dasie Tinnie MATSU, NP  gabapentin  (NEURONTIN ) 100 MG capsule Take 2 capsules (200 mg total) by mouth at bedtime. Patient taking differently: Take 100-200 mg by mouth 3 (three) times daily. 100 mg am 100 mg afternoon 200 mg bedtime 03/12/23   Marsa Edelman, DO  glucose blood test strip Use once daily. Use as instructed. 02/21/17   [provider]  HYDROcodone -acetaminophen  (NORCO/VICODIN) 5-325 MG tablet  Take 1 tablet by mouth every 6 (six) hours as needed for moderate pain (pain score 4-6) or severe pain (pain score 7-10).    [provider]  latanoprost  (XALATAN ) 0.005 % ophthalmic solution Place 1 drop into both eyes at bedtime. 09/13/15   [provider]  levothyroxine  (SYNTHROID ) 88 MCG tablet Take 88 mcg by mouth daily. Take on an empty stomach 30 to 60 minutes before breakfast 12/08/14   [provider]  lidocaine -prilocaine  (EMLA ) cream Apply 1 Application topically as needed. 03/24/24   Camara, Amadou, MD  multivitamin (RENA-VIT) TABS tablet Take 1 tablet by mouth at bedtime. 03/12/23   Alexander, Natalie, DO  Nutritional Supplements (FEEDING SUPPLEMENT, NEPRO CARB STEADY,) LIQD Take 237 mLs by mouth 2 (two) times daily between meals. 03/12/23   Alexander, Natalie, DO  rOPINIRole  (REQUIP ) 0.5 MG tablet Take 1 tablet (0.5 mg total) by mouth  in the morning and at bedtime. 08/22/23 08/16/24  Gregg Lek, MD  rosuvastatin  (CRESTOR ) 20 MG tablet Take 20 mg by mouth daily. 01/07/15   [provider]  timolol  (TIMOPTIC -XR) 0.5 % ophthalmic gel-forming Place 1 drop into both eyes every morning. 05/06/21   [provider]  traMADol  (ULTRAM ) 50 MG tablet Take 1 tablet (50 mg total) by mouth every 6 (six) hours as needed. 05/25/23 05/24/24  Schnier, Cordella MATSU, MD  traZODone  (DESYREL ) 50 MG tablet Take 1 tablet (50 mg total) by mouth at bedtime. 03/24/24   Gregg Lek, MD    Physical Exam: Vitals:   04/08/24 0700 04/08/24 0730 04/08/24 0744 04/08/24 0800  BP: (!) 125/91 (!) 110/51    Pulse: 90 71 69   Resp: (!) 24 (!) 23 (!) 30   Temp:      TempSrc:      SpO2: 100% 100% 100%   Weight:    58.5 kg    Constitutional: NAD, AAOx3 HEENT: conjunctivae and lids normal, EOMI CV: No cyanosis.   RESP: normal respiratory effort, on 4L Extremities: No effusions, edema in BLE SKIN: warm, dry Neuro: II - XII grossly intact.   Psych: Normal mood and affect.  Appropriate judgement and reason  Labs on Admission: I have personally reviewed labs and imaging studies  Time spent: 60 minutes  Ellouise Haber MD [facility]  If 7PM-7AM, please contact night-coverage 04/08/2024, 8:04 AM

## 2024-04-09 DIAGNOSIS — J9601 Acute respiratory failure with hypoxia: Secondary | ICD-10-CM | POA: Diagnosis not present

## 2024-04-09 LAB — HEPATITIS B SURFACE ANTIBODY, QUANTITATIVE: Hep B S AB Quant (Post): <3.5 m[IU]/mL — ABNORMAL LOW

## 2024-04-09 NOTE — Progress Notes (Signed)
  PROGRESS NOTE    Cassandra [name]  FMW:[id number] DOB: 1927/04/14 DOA: 04/08/2024 PCP: Cleotilde Oneil FALCON, MD  [id number]  LOS: 1 day   Brief hospital course:   Assessment & Plan: Cassandra [name] is a 89 y.o. female with medical history significant of ESRD on HD (TThSa), chronic leukocytosis, type 2 diabetes, hypertension, CAD s/p PCI 1999 who presented with acute respiratory distress and failure.    Acute hypoxemic respiratory failure 2/2 Pulm edema and bilateral pleural effusions --Per EMS, Patient was 78% on 15 liters non rebreather mask, and increased to 85% on EMS C-Pap.  Possibly flash pulm edema or fluid buildup from under UF. --pt was off BiPAP after urgent dialysis and on 4L [place]. --dialysis for fluid removal. --Continue supplemental O2 to keep sats >=90%, wean as tolerated  HTN --BP elevated on presentation, however, decreased after dialysis without further antihypertensives. --monitor   ESRD on HD TTS --iHD per nephro --next session tomorrow   DM2 --recent A1c 6.4.  No hypoglycemics on home med list. --no need for BG checks   Chronic leukocytosis --no source of infection.  Respiratory status improved with just dialysis, so unlikely to be PNA.   CAD --cont home ASA --resume home statin after discharge.   DVT prophylaxis: Heparin  SQ Code Status: Full code  Family Communication:  Level of care: Med-Surg Dispo:   The patient is from: home Anticipated d/c is to: home Anticipated d/c date is: 1-2 days   Subjective and Interval History:  Pt reported feeling much improved, but still needed at least 4L O2.   Objective: Vitals:   04/08/24 1620 04/08/24 2104 04/09/24 0427 04/09/24 0800  BP: (!) 103/55 (!) 123/56 (!) 140/52 (!) 130/50  Pulse: 79 74 75 67  Resp: 19 20 18 16   Temp: 99.5 F (37.5 C) 98.7 F (37.1 C) 98.6 F (37 C) 98.6 F (37 C)  TempSrc: Oral Oral Oral Oral  SpO2: (!) 89% (!) 89% 90% (!) 89%  Weight:   48.9 kg   Height:   5' 1 (1.549  m)     Intake/Output Summary (Last 24 hours) at 04/09/2024 1908 Last data filed at 04/09/2024 1522 Gross per 24 hour  Intake 385 ml  Output --  Net 385 ml   Filed Weights   04/08/24 0800 04/08/24 0815 04/09/24 0427  Weight: 58.5 kg 58 kg 48.9 kg    Examination:   Constitutional: NAD, AAOx3 HEENT: conjunctivae and lids normal, EOMI CV: No cyanosis.   RESP: normal respiratory effort, on [place] Neuro: II - XII grossly intact.   Psych: Normal mood and affect.     Data Reviewed: I have personally reviewed labs and imaging studies  Time spent: 35 minutes  Ellouise Haber, MD [facility] If 7PM-7AM, please contact night-coverage 04/09/2024, 7:08 PM

## 2024-04-09 NOTE — Care Management Important Message (Signed)
 Important Message  Patient Details  Name: Cassandra [name] MRN: [id number] Date of Birth: [birth year]   Important Message Given:  Yes - Medicare IM     Cassandra [name] 04/09/2024, 3:10 PM

## 2024-04-09 NOTE — Plan of Care (Signed)

## 2024-04-09 NOTE — Progress Notes (Signed)
 [facility]  ROUNDING NOTE   Subjective:   Cassandra [name] is a 89 y.o. female known to our practice from outpatient dialysis.  She dialyzes at [employer] TTS first shift.  Patient presents to the emergency department in respiratory distress, placed on CPAP by EMS due to decreased saturations on 15 L, 78%.  Patient is currently admitted for Respiratory distress [R06.03] Flash pulmonary edema (HCC) [J81.0] Acute hypoxemic respiratory failure (HCC) [J96.01]  Patient seen resting in bed Alert and oriented Currently on 4 L nasal cannula Experienced shortness of breath this morning  Objective:  Vital signs in last 24 hours:  Temp:  [98.6 F (37 C)-99.5 F (37.5 C)] 98.6 F (37 C) (07/16 0800) Pulse Rate:  [67-86] 67 (07/16 0800) Resp:  [16-25] 16 (07/16 0800) BP: (98-141)/(50-77) 130/50 (07/16 0800) SpO2:  [89 %-99 %] 89 % (07/16 0800) FiO2 (%):  [60 %] 60 % (07/15 1253) Weight:  [48.9 kg] 48.9 kg (07/16 0427)  Weight change:  Filed Weights   04/08/24 0800 04/08/24 0815 04/09/24 0427  Weight: 58.5 kg 58 kg 48.9 kg    Intake/Output: I/O last 3 completed shifts: In: 120 [P.O.:120] Out: 1900 [Other:1900]   Intake/Output this shift:  Total I/O In: 240 [P.O.:240] Out: -   Physical Exam: General: Ill-appearing  Head: Normocephalic, atraumatic.  Dry oral mucosal membranes  Eyes: Anicteric  Neck: Supple  Lungs:  Diminished, NCO 2  Heart: Regular rate and rhythm  Abdomen:  Soft, nontender  Extremities: Trace peripheral edema.  Neurologic: Awake, alert, conversant  Skin: Warm,dry, no rash  Access: Left upper aVF    Basic Metabolic Panel: Recent Labs  Lab 04/08/24 0605  NA 138  K 5.1  CL 97*  CO2 28  GLUCOSE 115*  BUN 42*  CREATININE 7.32*  CALCIUM  8.1*    Liver Function Tests: Recent Labs  Lab 04/08/24 0605  AST 26  ALT 13  ALKPHOS 70  BILITOT 1.2  PROT 7.4  ALBUMIN  3.0*   No results for input(s): LIPASE, AMYLASE in the  last 168 hours. No results for input(s): AMMONIA in the last 168 hours.  CBC: Recent Labs  Lab 04/08/24 0605  WBC 19.4*  NEUTROABS 15.4*  HGB 14.1  HCT 45.4  MCV 100.4*  PLT 668*    Cardiac Enzymes: No results for input(s): CKTOTAL, CKMB, CKMBINDEX, TROPONINI in the last 168 hours.  BNP: Invalid input(s): POCBNP  CBG: No results for input(s): GLUCAP in the last 168 hours.  Microbiology: Results for orders placed or performed during the hospital encounter of 08/24/23  Blood culture (routine x 2)     Status: None   Collection Time: 08/24/23  5:21 PM   Specimen: BLOOD  Result Value Ref Range Status   Specimen Description BLOOD BLOOD RIGHT FOREARM  Final   Special Requests   Final    BOTTLES DRAWN AEROBIC AND ANAEROBIC Blood Culture adequate volume   Culture   Final    NO GROWTH 5 DAYS Performed at [facility], [address] [place], [place] [address]    Report Status 08/29/2023 FINAL  Final  Blood culture (routine x 2)     Status: None   Collection Time: 08/24/23  8:36 PM   Specimen: BLOOD RIGHT ARM  Result Value Ref Range Status   Specimen Description BLOOD RIGHT ARM  Final   Special Requests   Final    BOTTLES DRAWN AEROBIC AND ANAEROBIC Blood Culture results may not be optimal due to an  excessive volume of blood received in culture bottles   Culture   Final    NO GROWTH 5 DAYS Performed at [facility], [address] [place] [place], [place] [address]    Report Status 08/29/2023 FINAL  Final  MRSA Next Gen by PCR, Nasal     Status: None   Collection Time: 08/25/23  5:38 PM   Specimen: Nasal Mucosa; Nasal Swab  Result Value Ref Range Status   MRSA by PCR Next Gen NOT DETECTED NOT DETECTED Final    Comment: (NOTE) The GeneXpert MRSA Assay (FDA approved for NASAL specimens only), is one component of a comprehensive MRSA colonization surveillance program. It is not intended to diagnose MRSA infection nor to guide or monitor  treatment for MRSA infections. Test performance is not FDA approved in patients less than 59 years old. Performed at [facility], [address] [place], [place] [address]     Coagulation Studies: No results for input(s): LABPROT, INR in the last 72 hours.  Urinalysis: No results for input(s): COLORURINE, LABSPEC, PHURINE, GLUCOSEU, HGBUR, BILIRUBINUR, KETONESUR, PROTEINUR, UROBILINOGEN, NITRITE, LEUKOCYTESUR in the last 72 hours.  Invalid input(s): APPERANCEUR    Imaging: DG Chest Port 1 View Result Date: 04/08/2024 CLINICAL DATA:  Shortness of breath and respiratory distress. EXAM: PORTABLE CHEST 1 VIEW COMPARISON:  08/24/2023 FINDINGS: Stable cardiomediastinal contours. Aortic atherosclerotic calcifications. Bilateral pleural effusions with blunting of the costophrenic angles noted, left greater than right. Diffuse increase interstitial markings identified bilaterally. Retrocardiac opacification may reflect left lower lobe atelectasis or consolidation. IMPRESSION: 1. Bilateral pleural effusions, left greater than right. 2. Diffuse increase interstitial markings compatible with pulmonary edema versus atypical infection. 3. Retrocardiac opacification may reflect left lower lobe atelectasis or consolidation. Electronically Signed   By: Waddell Calk M.D.   On: 04/08/2024 07:18     Medications:     aspirin  EC  81 mg Oral Daily   calcium  acetate  1,334 mg Oral TID AC   Chlorhexidine  Gluconate Cloth  6 each Topical Q0600   feeding supplement (NEPRO CARB STEADY)  237 mL Oral BID BM   heparin   5,000 Units Subcutaneous Q8H   levothyroxine   88 mcg Oral Q0600   timolol   1 drop Both Eyes q morning   ALPRAZolam , HYDROcodone -acetaminophen , rOPINIRole   Assessment/ Plan:  Cassandra [name] is a 89 y.o.  female with diabetes, hypertension, hypothyroidism, coronary artery disease, diastolic CHF, thrombocytosis and erythrocytosis, end-stage renal disease  was admitted on 08/24/2023 for Respiratory distress [R06.03] Flash pulmonary edema (HCC) [J81.0] Acute hypoxemic respiratory failure (HCC) [J96.01]  [name]  End-stage renal disease on hemodialysis.  Patient received dialysis yesterday, UF 1.9 L achieved.  Next treatment scheduled for Thursday.  2.  Acute respiratory failure requiring CPAP on EMS arrival.Chest x-ray shows bilateral pleural effusions, left greater than right, interstitial markings concerning for pulmonary edema versus atypical infection.  Fluid removed with dialysis, 1.9 L.  Patient weaned to 4 L nasal cannula.  3. Anemia of chronic kidney disease Lab Results  Component Value Date   HGB 14.1 04/08/2024    Hemoglobin at goal.  No need for ESA's.  3. Secondary Hyperparathyroidism: with outpatient labs: PTH 445, phosphorus 6.9, calcium  7.9 on 03/05/24.   Lab Results  Component Value Date   CALCIUM  8.1 (L) 04/08/2024   CAION 1.11 (L) 05/25/2023   PHOS 6.1 (H) 03/13/2023    Calcium  within desired range.  Will obtain updated labs with dialysis in a.m.  Patient prescribed calcium  acetate  with meals outpatient.  4.  Hypertension with chronic kidney disease.  Home regimen includes clonidine , furosemide , and metoprolol .  Blood pressure 130/50 this morning.   LOS: 1 Paden Kuras 7/16/202511:12 AM

## 2024-04-09 NOTE — TOC CM/SW Note (Signed)
 Transition of Care [facility]) - Inpatient Brief Assessment   Patient Details  Name: Cassandra [name] MRN: [id number] Date of Birth: 1927-01-31  Transition of Care [facility]) CM/SW Contact:    Corean ONEIDA Haddock, RN Phone Number: 04/09/2024, 9:41 AM   Clinical Narrative:    Transition of Care [facility]) Screening Note   Patient Details  Name: Cassandra [name] Date of Birth: [birth year]   Transition of Care [facility]) CM/SW Contact:    Corean ONEIDA Haddock, RN Phone Number: 04/09/2024, 9:41 AM    Transition of Care Department [facility]) has reviewed patient and no TOC needs have been identified at this time.  If new patient transition needs arise, please place a TOC consult.   Per chart review Home o2 through [employer] was arranged in 12/24, message sent to [facility] with [employer] to confirm patient still has home O2.  He confirms she has an order for 2L continuous at home  Transition of Care Asessment: Insurance and Status: Insurance coverage has been reviewed Patient has primary care physician: Yes     Prior/Current Home Services: No current home services Social Drivers of Health Review: SDOH reviewed no interventions necessary Readmission risk has been reviewed: Yes Transition of care needs: no transition of care needs at this time

## 2024-04-09 NOTE — Plan of Care (Signed)
   Problem: Education: Goal: Knowledge of General Education information will improve Description: Including pain rating scale, medication(s)/side effects and non-pharmacologic comfort measures Outcome: Progressing   Problem: Pain Managment: Goal: General experience of comfort will improve and/or be controlled Outcome: Progressing   Problem: Safety: Goal: Ability to remain free from injury will improve Outcome: Progressing

## 2024-04-10 DIAGNOSIS — J9601 Acute respiratory failure with hypoxia: Secondary | ICD-10-CM | POA: Diagnosis not present

## 2024-04-10 MED ORDER — GABAPENTIN 100 MG PO CAPS: 100.0000 mg | ORAL_CAPSULE | Freq: Three times a day (TID) | ORAL | Status: AC

## 2024-04-10 MED ORDER — HEPARIN SODIUM (PORCINE) 1000 UNIT/ML IJ SOLN
INTRAMUSCULAR | Status: AC
  Filled 2024-04-10: qty 10

## 2024-04-10 NOTE — Procedures (Signed)
 Received patient in bed to unit.  Alert and oriented.  Informed consent signed and in chart.   TX duration:3.5hrs  Patient tolerated well.  Transported back to the room  Alert, without acute distress.  Hand-off given to patient's nurse.   Access used: Left upper arm AVF.  Access issues: NONE  Total UF removed: Medication(s) given: NONE    [name]

## 2024-04-10 NOTE — Discharge Summary (Signed)
 Physician Discharge Summary   Cassandra [name]  female DOB: [birth year]  FMW:[id number]  PCP: Cleotilde Oneil FALCON, MD  Admit date: 04/08/2024 Discharge date: 04/10/2024  Admitted From: home Disposition:  home Husband updated at bedside prior to discharge. CODE STATUS: Full code  Discharge Instructions     Discharge diet:   Complete by: As directed    Renal diet.      Hospital Course:  For full details, please see H&P, progress notes, consult notes and ancillary notes.  Briefly,  Cassandra [name] is a 89 y.o. female with medical history significant of ESRD on HD (TThSa), chronic leukocytosis, type 2 diabetes, hypertension, CAD s/p PCI 1999 who presented with acute respiratory distress and failure.    Acute on chronic hypoxemic respiratory failure 2/2 Pulm edema and bilateral pleural effusions --Per EMS, Patient was 78% on 15 liters non rebreather mask, and increased to 85% on EMS C-Pap.  Possibly flash pulm edema or fluid buildup from under dialysis. --pt was off BiPAP after urgent dialysis and on 4L [place]. --received dialysis for fluid removal. --Per [employer], pt has 2L O2 order for home use.  Pt was back on home 2L O2 prior to discharge.   HTN --BP elevated on presentation, however, decreased after dialysis without further antihypertensives.   ESRD on HD TTS --received HD on 7/15 and 7/17.   DM2 --recent A1c 6.4.  No hypoglycemics on home med list. --no need for BG checks   Chronic leukocytosis --no source of infection.  Respiratory status improved with just dialysis, so unlikely to be PNA.   CAD --cont home ASA --resume home statin after discharge.   Unless noted above, medications under STOP list are ones pt was not taking PTA.  Discharge Diagnoses:  Principal Problem:   Acute hypoxemic respiratory failure (HCC)   30 Day Unplanned Readmission Risk Score    Flowsheet Row ED to Hosp-Admission (Current) from 04/08/2024 in [facility]  30 Day Unplanned Readmission Risk Score (%) 20.1 Filed at 04/10/2024 1200    This score is the patient's risk of an unplanned readmission within 30 days of being discharged (0 -100%). The score is based on dignosis, age, lab data, medications, orders, and past utilization.   Low:  0-14.9   Medium: 15-21.9   High: 22-29.9   Extreme: 30 and above         Discharge Instructions:  Allergies as of 04/10/2024       Reactions   Atorvastatin Other (See Comments)   MYALGIA   Cyclobenzaprine Other (See Comments)   hallucination   Hydrocodone -acetaminophen     Other reaction(s): Hallucination when taking whole pill, pt tolerates taking 1/2 pill   Mirtazapine    Other reaction(s): Hallucination   Oxycodone-acetaminophen  Other (See Comments)   hallucination   Paroxetine Hcl Other (See Comments)   hallucination   Penicillins Swelling   Lip and orbital swelling Tolerated 3rd generation cephalosporin (CEFTRIAXONE ) between 03/05/2023 and 03/09/2023 with no documented ADRs.   Propoxyphene Other (See Comments)   hallucination   Trazodone  Other (See Comments)   hallucination        Medication List     STOP taking these medications    FLUoxetine 20 MG capsule Commonly known as: PROZAC   multivitamin Tabs tablet   pregabalin 50 MG capsule Commonly known as: LYRICA   traMADol  50 MG tablet Commonly known as: Ultram        TAKE these medications    ALPRAZolam  0.25 MG tablet Commonly  known as: XANAX  Take 1 tablet (0.25 mg total) by mouth daily as needed.   amLODipine  5 MG tablet Commonly known as: NORVASC  Take 5 mg by mouth daily.   aspirin  EC 81 MG tablet Take 1 tablet (81 mg total) by mouth daily. Swallow whole.   calcium  acetate 667 MG capsule Commonly known as: PHOSLO  Take 1,334 mg by mouth 3 (three) times daily.   cetirizine 10 MG tablet Commonly known as: ZYRTEC Take 1 tablet by mouth daily as needed for allergies.   [employer] D3 25 MCG (1000 UT)  capsule Generic drug: Cholecalciferol  Take 1,000 Units by mouth daily.   Droxia  300 MG capsule Generic drug: hydroxyurea  TAKE 1 CAPSULE (300 MG TOTAL) BY MOUTH DAILY. MAY TAKE WITH FOOD TO MINIMIZE GI SIDE EFFECTS.   feeding supplement (NEPRO CARB STEADY) Liqd Take 237 mLs by mouth 2 (two) times daily between meals.   gabapentin  100 MG capsule Commonly known as: NEURONTIN  Take 1-2 capsules (100-200 mg total) by mouth 3 (three) times daily. 100 mg am 100 mg afternoon 200 mg bedtime. Home med. What changed:  how much to take when to take this additional instructions   glucose blood test strip Use once daily. Use as instructed.   HYDROcodone -acetaminophen  5-325 MG tablet Commonly known as: NORCO/VICODIN Take 1 tablet by mouth every 6 (six) hours as needed for moderate pain (pain score 4-6) or severe pain (pain score 7-10).   latanoprost  0.005 % ophthalmic solution Commonly known as: XALATAN  Place 1 drop into both eyes at bedtime.   levothyroxine  88 MCG tablet Commonly known as: SYNTHROID  Take 88 mcg by mouth daily. Take on an empty stomach 30 to 60 minutes before breakfast   lidocaine -prilocaine  cream Commonly known as: EMLA  Apply 1 Application topically as needed.   rOPINIRole  0.5 MG tablet Commonly known as: REQUIP  Take 1 tablet (0.5 mg total) by mouth in the morning and at bedtime.   rosuvastatin  20 MG tablet Commonly known as: CRESTOR  Take 20 mg by mouth daily.   sertraline 50 MG tablet Commonly known as: ZOLOFT Take 50 mg by mouth daily.   timolol  0.5 % ophthalmic gel-forming Commonly known as: TIMOPTIC -XR Place 1 drop into both eyes every morning.   traZODone  50 MG tablet Commonly known as: DESYREL  Take 1 tablet (50 mg total) by mouth at bedtime.         Follow-up Information     Cleotilde Oneil FALCON, MD Follow up in 1 week(s).   Specialty: Internal Medicine Contact information: [phone] [facility] [address] [facility] [place]  [address] [phone]                 Allergies  Allergen Reactions   Atorvastatin Other (See Comments)    MYALGIA   Cyclobenzaprine Other (See Comments)    hallucination   Hydrocodone -Acetaminophen      Other reaction(s): Hallucination when taking whole pill, pt tolerates taking 1/2 pill   Mirtazapine     Other reaction(s): Hallucination   Oxycodone-Acetaminophen  Other (See Comments)    hallucination   Paroxetine Hcl Other (See Comments)    hallucination   Penicillins Swelling    Lip and orbital swelling Tolerated 3rd generation cephalosporin (CEFTRIAXONE ) between 03/05/2023 and 03/09/2023 with no documented ADRs.   Propoxyphene Other (See Comments)    hallucination   Trazodone  Other (See Comments)    hallucination     The results of significant diagnostics from this hospitalization (including imaging, microbiology, ancillary and laboratory) are listed below for reference.  Consultations:   Procedures/Studies: DG Chest Port 1 View Result Date: 04/08/2024 CLINICAL DATA:  Shortness of breath and respiratory distress. EXAM: PORTABLE CHEST 1 VIEW COMPARISON:  08/24/2023 FINDINGS: Stable cardiomediastinal contours. Aortic atherosclerotic calcifications. Bilateral pleural effusions with blunting of the costophrenic angles noted, left greater than right. Diffuse increase interstitial markings identified bilaterally. Retrocardiac opacification may reflect left lower lobe atelectasis or consolidation. IMPRESSION: 1. Bilateral pleural effusions, left greater than right. 2. Diffuse increase interstitial markings compatible with pulmonary edema versus atypical infection. 3. Retrocardiac opacification may reflect left lower lobe atelectasis or consolidation. Electronically Signed   By: Waddell Calk M.D.   On: 04/08/2024 07:18      Labs: BNP (last 3 results) Recent Labs    08/24/23 1332 04/08/24 0605  BNP 429.9* 905.4*   Basic Metabolic Panel: Recent Labs  Lab  04/08/24 0605  NA 138  K 5.1  CL 97*  CO2 28  GLUCOSE 115*  BUN 42*  CREATININE 7.32*  CALCIUM  8.1*   Liver Function Tests: Recent Labs  Lab 04/08/24 0605  AST 26  ALT 13  ALKPHOS 70  BILITOT 1.2  PROT 7.4  ALBUMIN  3.0*   No results for input(s): LIPASE, AMYLASE in the last 168 hours. No results for input(s): AMMONIA in the last 168 hours. CBC: Recent Labs  Lab 04/08/24 0605  WBC 19.4*  NEUTROABS 15.4*  HGB 14.1  HCT 45.4  MCV 100.4*  PLT 668*   Cardiac Enzymes: No results for input(s): CKTOTAL, CKMB, CKMBINDEX, TROPONINI in the last 168 hours. BNP: Invalid input(s): POCBNP CBG: No results for input(s): GLUCAP in the last 168 hours. D-Dimer No results for input(s): DDIMER in the last 72 hours. Hgb A1c No results for input(s): HGBA1C in the last 72 hours. Lipid Profile No results for input(s): CHOL, HDL, LDLCALC, TRIG, CHOLHDL, LDLDIRECT in the last 72 hours. Thyroid function studies No results for input(s): TSH, T4TOTAL, T3FREE, THYROIDAB in the last 72 hours.  Invalid input(s): FREET3 Anemia work up No results for input(s): VITAMINB12, FOLATE, FERRITIN, TIBC, IRON, RETICCTPCT in the last 72 hours. Urinalysis    Component Value Date/Time   COLORURINE STRAW (A) 12/12/2021 1702   APPEARANCEUR CLEAR (A) 12/12/2021 1702   LABSPEC 1.005 12/12/2021 1702   PHURINE 7.0 12/12/2021 1702   GLUCOSEU 50 (A) 12/12/2021 1702   HGBUR MODERATE (A) 12/12/2021 1702   BILIRUBINUR NEGATIVE 12/12/2021 1702   KETONESUR NEGATIVE 12/12/2021 1702   PROTEINUR 100 (A) 12/12/2021 1702   NITRITE NEGATIVE 12/12/2021 1702   LEUKOCYTESUR SMALL (A) 12/12/2021 1702   Sepsis Labs Recent Labs  Lab 04/08/24 0605  WBC 19.4*   Microbiology No results found for this or any previous visit (from the past 240 hours).   Total time spend on discharging this patient, including the last patient exam, discussing the hospital stay,  instructions for ongoing care as it relates to all pertinent caregivers, as well as preparing the medical discharge records, prescriptions, and/or referrals as applicable, is 30 minutes.    Ellouise Haber, MD  [facility] 04/10/2024, 1:59 PM

## 2024-04-10 NOTE — Plan of Care (Signed)
   Problem: Education: Goal: Knowledge of General Education information will improve Description Including pain rating scale, medication(s)/side effects and non-pharmacologic comfort measures Outcome: Progressing

## 2024-04-10 NOTE — Progress Notes (Signed)
 [facility]  ROUNDING NOTE   Subjective:   Cassandra [name] is a 89 y.o. female known to our practice from outpatient dialysis.  She dialyzes at [facility] T[facility] first shift.  Patient presents to the emergency department in respiratory distress, placed on CPAP by EMS due to decreased saturations on 15 L, 78%.  Patient is currently admitted for Respiratory distress [R06.03] Flash pulmonary edema (HCC) [J81.0] Acute hypoxemic respiratory failure (HCC) [J96.01]  Patient seen and evaluated during dialysis   HEMODIALYSIS FLOWSHEET:  Blood Flow Rate (mL/min): 349 mL/min Arterial Pressure (mmHg): -241.8 mmHg Venous Pressure (mmHg): 187.06 mmHg TMP (mmHg): -4.24 mmHg Ultrafiltration Rate (mL/min): 624 mL/min Dialysate Flow Rate (mL/min): 299 ml/min  Tolerating treatment well Denies shortness of breath  Objective:  Vital signs in last 24 hours:  Temp:  [97.8 F (36.6 C)-97.9 F (36.6 C)] 97.8 F (36.6 C) (07/17 0757) Pulse Rate:  [64-78] 77 (07/17 1100) Resp:  [13-23] 22 (07/17 1100) BP: (122-171)/(52-79) 146/79 (07/17 1100) SpO2:  [89 %-96 %] 94 % (07/17 1100) Weight:  [55.4 kg] 55.4 kg (07/17 0757)  Weight change:  Filed Weights   04/08/24 0815 04/09/24 0427 04/10/24 0757  Weight: 58 kg 48.9 kg 55.4 kg    Intake/Output: I/O last 3 completed shifts: In: 385 [P.O.:385] Out: -    Intake/Output this shift:  No intake/output data recorded.  Physical Exam: General: NAD  Head: Normocephalic, atraumatic.  Dry oral mucosal membranes  Eyes: Anicteric  Neck: Supple  Lungs:  Diminished, NCO 2  Heart: Regular rate and rhythm  Abdomen:  Soft, nontender  Extremities: No peripheral edema.  Neurologic: Awake, alert, conversant  Skin: Warm,dry, no rash  Access: Left upper aVF    Basic Metabolic Panel: Recent Labs  Lab 04/08/24 0605  NA 138  K 5.1  CL 97*  CO2 28  GLUCOSE 115*  BUN 42*  CREATININE 7.32*  CALCIUM  8.1*    Liver Function  Tests: Recent Labs  Lab 04/08/24 0605  AST 26  ALT 13  ALKPHOS 70  BILITOT 1.2  PROT 7.4  ALBUMIN  3.0*   No results for input(s): LIPASE, AMYLASE in the last 168 hours. No results for input(s): AMMONIA in the last 168 hours.  CBC: Recent Labs  Lab 04/08/24 0605  WBC 19.4*  NEUTROABS 15.4*  HGB 14.1  HCT 45.4  MCV 100.4*  PLT 668*    Cardiac Enzymes: No results for input(s): CKTOTAL, CKMB, CKMBINDEX, TROPONINI in the last 168 hours.  BNP: Invalid input(s): POCBNP  CBG: No results for input(s): GLUCAP in the last 168 hours.  Microbiology: Results for orders placed or performed during the hospital encounter of 08/24/23  Blood culture (routine x 2)     Status: None   Collection Time: 08/24/23  5:21 PM   Specimen: BLOOD  Result Value Ref Range Status   Specimen Description BLOOD BLOOD RIGHT FOREARM  Final   Special Requests   Final    BOTTLES DRAWN AEROBIC AND ANAEROBIC Blood Culture adequate volume   Culture   Final    NO GROWTH 5 DAYS Performed at [facility], [address], [place], [place] [address]    Report Status 08/29/2023 FINAL  Final  Blood culture (routine x 2)     Status: None   Collection Time: 08/24/23  8:36 PM   Specimen: BLOOD RIGHT ARM  Result Value Ref Range Status   Specimen Description BLOOD RIGHT ARM  Final   Special Requests   Final  BOTTLES DRAWN AEROBIC AND ANAEROBIC Blood Culture results may not be optimal due to an excessive volume of blood received in culture bottles   Culture   Final    NO GROWTH 5 DAYS Performed at [facility], [address] [place], [place] [address]    Report Status 08/29/2023 FINAL  Final  MRSA Next Gen by PCR, Nasal     Status: None   Collection Time: 08/25/23  5:38 PM   Specimen: Nasal Mucosa; Nasal Swab  Result Value Ref Range Status   MRSA by PCR Next Gen NOT DETECTED NOT DETECTED Final    Comment: (NOTE) The GeneXpert MRSA Assay (FDA approved for NASAL  specimens only), is one component of a comprehensive MRSA colonization surveillance program. It is not intended to diagnose MRSA infection nor to guide or monitor treatment for MRSA infections. Test performance is not FDA approved in patients less than 62 years old. Performed at [facility], [address] [place], [place] [address]     Coagulation Studies: No results for input(s): LABPROT, INR in the last 72 hours.  Urinalysis: No results for input(s): COLORURINE, LABSPEC, PHURINE, GLUCOSEU, HGBUR, BILIRUBINUR, KETONESUR, PROTEINUR, UROBILINOGEN, NITRITE, LEUKOCYTESUR in the last 72 hours.  Invalid input(s): APPERANCEUR    Imaging: No results found.    Medications:     aspirin  EC  81 mg Oral Daily   calcium  acetate  1,334 mg Oral TID AC   Chlorhexidine  Gluconate Cloth  6 each Topical Q0600   feeding supplement (NEPRO CARB STEADY)  237 mL Oral BID BM   heparin   5,000 Units Subcutaneous Q8H   levothyroxine   88 mcg Oral Q0600   timolol   1 drop Both Eyes q morning   ALPRAZolam , HYDROcodone -acetaminophen , rOPINIRole   Assessment/ Plan:  [name] is a 89 y.o.  female with diabetes, hypertension, hypothyroidism, coronary artery disease, diastolic CHF, thrombocytosis and erythrocytosis, end-stage renal disease was admitted on 08/24/2023 for Respiratory distress [R06.03] Flash pulmonary edema (HCC) [J81.0] Acute hypoxemic respiratory failure (HCC) [J96.01]  [facility]/TTS/left aVF  End-stage renal disease on hemodialysis.  Patient receiving dialysis today, UF goal 1.5 to 2 L as tolerated.  Next treatment scheduled for Saturday.  Will defer discharge plan to primary team.  2.  Acute respiratory failure requiring CPAP on EMS arrival.Chest x-ray shows bilateral pleural effusions, left greater than right, interstitial markings concerning for pulmonary edema versus atypical infection.  Currently weaned to 2 L nasal  cannula.  3. Anemia of chronic kidney disease Lab Results  Component Value Date   HGB 14.1 04/08/2024    Hemoglobin at goal.  No need for ESA's.  3. Secondary Hyperparathyroidism: with outpatient labs: PTH 445, phosphorus 6.9, calcium  7.9 on 03/05/24.   Lab Results  Component Value Date   CALCIUM  8.1 (L) 04/08/2024   CAION 1.11 (L) 05/25/2023   PHOS 6.1 (H) 03/13/2023    Calcium  acceptable at last check.  Patient prescribed calcium  acetate with meals outpatient.  This has been continued during this admission.  4.  Hypertension with chronic kidney disease.  Home regimen includes clonidine , furosemide , and metoprolol .  Blood pressure 146/79 during dialysis.   LOS: 2 Leonardo Plaia 7/17/202511:32 AM

## 2024-04-10 NOTE — Plan of Care (Signed)
  Problem: Education: Goal: Knowledge of General Education information will improve Description: Including pain rating scale, medication(s)/side effects and non-pharmacologic comfort measures 04/10/2024 1545 by Les Delon CROME, RN Outcome: Adequate for Discharge 04/10/2024 1545 by Les Delon CROME, RN Outcome: Progressing 04/10/2024 1103 by Les Delon CROME, RN Outcome: Progressing   Problem: Health Behavior/Discharge Planning: Goal: Ability to manage health-related needs will improve Outcome: Adequate for Discharge   Problem: Clinical Measurements: Goal: Ability to maintain clinical measurements within normal limits will improve Outcome: Adequate for Discharge Goal: Will remain free from infection Outcome: Adequate for Discharge Goal: Diagnostic test results will improve Outcome: Adequate for Discharge Goal: Respiratory complications will improve Outcome: Adequate for Discharge Goal: Cardiovascular complication will be avoided Outcome: Adequate for Discharge   Problem: Activity: Goal: Risk for activity intolerance will decrease Outcome: Adequate for Discharge   Problem: Nutrition: Goal: Adequate nutrition will be maintained Outcome: Adequate for Discharge   Problem: Coping: Goal: Level of anxiety will decrease Outcome: Adequate for Discharge   Problem: Elimination: Goal: Will not experience complications related to bowel motility Outcome: Adequate for Discharge Goal: Will not experience complications related to urinary retention Outcome: Adequate for Discharge   Problem: Pain Managment: Goal: General experience of comfort will improve and/or be controlled Outcome: Adequate for Discharge   Problem: Safety: Goal: Ability to remain free from injury will improve Outcome: Adequate for Discharge   Problem: Skin Integrity: Goal: Risk for impaired skin integrity will decrease Outcome: Adequate for Discharge

## 2024-04-30 ENCOUNTER — Ambulatory Visit: Admitting: Student in an Organized Health Care Education/Training Program

## 2024-05-13 ENCOUNTER — Other Ambulatory Visit (INDEPENDENT_AMBULATORY_CARE_PROVIDER_SITE_OTHER): Payer: Self-pay | Admitting: Nurse Practitioner

## 2024-05-13 DIAGNOSIS — N186 End stage renal disease: Secondary | ICD-10-CM

## 2024-05-13 DIAGNOSIS — T82898S Other specified complication of vascular prosthetic devices, implants and grafts, sequela: Secondary | ICD-10-CM

## 2024-05-19 ENCOUNTER — Ambulatory Visit (INDEPENDENT_AMBULATORY_CARE_PROVIDER_SITE_OTHER): Admitting: Vascular Surgery

## 2024-05-19 ENCOUNTER — Ambulatory Visit (INDEPENDENT_AMBULATORY_CARE_PROVIDER_SITE_OTHER)

## 2024-05-19 ENCOUNTER — Encounter (INDEPENDENT_AMBULATORY_CARE_PROVIDER_SITE_OTHER): Payer: Self-pay | Admitting: Vascular Surgery

## 2024-05-19 VITALS — BP 145/87 | HR 64 | Ht 61.0 in | Wt 123.5 lb

## 2024-05-19 DIAGNOSIS — I10 Essential (primary) hypertension: Secondary | ICD-10-CM

## 2024-05-19 DIAGNOSIS — T82591S Other mechanical complication of surgically created arteriovenous shunt, sequela: Secondary | ICD-10-CM

## 2024-05-19 DIAGNOSIS — I2510 Atherosclerotic heart disease of native coronary artery without angina pectoris: Secondary | ICD-10-CM | POA: Diagnosis not present

## 2024-05-19 DIAGNOSIS — E1122 Type 2 diabetes mellitus with diabetic chronic kidney disease: Secondary | ICD-10-CM

## 2024-05-19 DIAGNOSIS — N186 End stage renal disease: Secondary | ICD-10-CM

## 2024-05-19 DIAGNOSIS — Z992 Dependence on renal dialysis: Secondary | ICD-10-CM

## 2024-05-19 DIAGNOSIS — T82898S Other specified complication of vascular prosthetic devices, implants and grafts, sequela: Secondary | ICD-10-CM

## 2024-06-01 ENCOUNTER — Encounter (INDEPENDENT_AMBULATORY_CARE_PROVIDER_SITE_OTHER): Payer: Self-pay | Admitting: Vascular Surgery

## 2024-06-01 DIAGNOSIS — T82591A Other mechanical complication of surgically created arteriovenous shunt, initial encounter: Secondary | ICD-10-CM | POA: Insufficient documentation

## 2024-06-01 NOTE — Progress Notes (Signed)
 MRN : [id number]  Cassandra [name] is a 89 y.o. (1927/04/10) female who presents with chief complaint of check access.  History of Present Illness:   The patient returns to the office for follow up regarding a problem with their dialysis access.   The patient notes a significant increase in problems with dialysis.  It is reported that adequate dialysis is not being achieved.    Although the dialysis center is reporting steal symptoms the patient denies hand pain or other symptoms consistent with steal phenomena.  No significant arm swelling.  She does note increased bleeding after the needles were removed but this has not become a major issue as of this time.  The patient denies redness or swelling at the access site. The patient denies fever or chills at home or while on dialysis.  No recent shortening of the patient's walking distance or new symptoms consistent with claudication.  No history of rest pain symptoms. No new ulcers or wounds of the lower extremities have occurred.  The patient denies amaurosis fugax or recent TIA symptoms. There are no recent neurological changes noted. There is no history of DVT, PE or superficial thrombophlebitis. No recent episodes of angina or shortness of breath documented.   Duplex ultrasound of the AV access shows a patent access.  The previously noted stenosis is significantly increased compared to last study.  Flow volume today is 987 cc/min (previous flow volume was 1248 cc/min)  Current Meds  Medication Sig   ALPRAZolam  (XANAX ) 0.25 MG tablet Take 1 tablet (0.25 mg total) by mouth daily as needed.   amLODipine  (NORVASC ) 5 MG tablet Take 5 mg by mouth daily.   aspirin  EC 81 MG EC tablet Take 1 tablet (81 mg total) by mouth daily. Swallow whole.   calcium  acetate (PHOSLO ) 667 MG capsule Take 1,334 mg by mouth 3 (three) times daily.   cetirizine (ZYRTEC) 10 MG tablet Take 1 tablet by mouth daily as needed for allergies.    [employer] D3 25 MCG (1000 UT) capsule Take 1,000 Units by mouth daily.   DROXIA  300 MG capsule TAKE 1 CAPSULE (300 MG TOTAL) BY MOUTH DAILY. MAY TAKE WITH FOOD TO MINIMIZE GI SIDE EFFECTS.   gabapentin  (NEURONTIN ) 100 MG capsule Take 1-2 capsules (100-200 mg total) by mouth 3 (three) times daily. 100 mg am 100 mg afternoon 200 mg bedtime. Home med.   glucose blood test strip Use once daily. Use as instructed.   HYDROcodone -acetaminophen  (NORCO/VICODIN) 5-325 MG tablet Take 1 tablet by mouth every 6 (six) hours as needed for moderate pain (pain score 4-6) or severe pain (pain score 7-10).   latanoprost  (XALATAN ) 0.005 % ophthalmic solution Place 1 drop into both eyes at bedtime.   levothyroxine  (SYNTHROID ) 88 MCG tablet Take 88 mcg by mouth daily. Take on an empty stomach 30 to 60 minutes before breakfast   lidocaine -prilocaine  (EMLA ) cream Apply 1 Application topically as needed.   Nutritional Supplements (FEEDING SUPPLEMENT, NEPRO CARB STEADY,) LIQD Take 237 mLs by mouth 2 (two) times daily between meals.   rOPINIRole  (REQUIP ) 0.5 MG tablet Take 1 tablet (0.5 mg total) by mouth in the morning and at bedtime.   rosuvastatin  (CRESTOR ) 20 MG tablet Take 20 mg by mouth daily.   sertraline (ZOLOFT) 50 MG tablet Take 50 mg by mouth daily.   timolol  (TIMOPTIC -XR) 0.5 % ophthalmic gel-forming Place 1 drop  into both eyes every morning.   traZODone  (DESYREL ) 50 MG tablet Take 1 tablet (50 mg total) by mouth at bedtime.    Past Medical History:  Diagnosis Date   Anxiety    Aortic atherosclerosis (HCC)    Arthritis    osteoarthritis in hands, knees ( R torn cartilage, L meniscus removed)    Atherosclerosis of native coronary artery of native heart with unstable angina pectoris (HCC)    CAD (coronary artery disease) 1999   a.) s/p PCI (details unkown) in [place]  in 1999   Campylobacter antigen positive 11/29/2014   a.) presented to [facility] ED with N/V/D; stool culture (+)   ESRD on hemodialysis (HCC)     a.) T-Th-Sat   Glaucoma    Hypercholesterolemia    Hyperlipidemia    Hypertension    Hypothyroidism    Lumbar stenosis with neurogenic claudication    Perforation of colon as colonoscopy complication (HCC) 2010   a.) required short term colostomy with subsequent take down/reversal   RLS (restless legs syndrome)    Thrombocytosis    Type 2 diabetes mellitus with chronic kidney disease (HCC)     Past Surgical History:  Procedure Laterality Date   A/V FISTULAGRAM Left 12/25/2023   Procedure: A/V Fistulagram;  Surgeon: Jama Cordella MATSU, MD;  Location: [facility];  Service: Cardiovascular;  Laterality: Left;   ABDOMINAL HYSTERECTOMY     left ovary removed   AV FISTULA PLACEMENT Left 05/25/2023   Procedure: ARTERIOVENOUS (AV) FISTULA CREATION (BRACHIALCEPHALIC);  Surgeon: Jama Cordella MATSU, MD;  Location: [facility] OR[facility];  Service: Vascular;  Laterality: Left;   BLEPHAROPLASTY Bilateral    CATARACT EXTRACTION W/ INTRAOCULAR LENS  IMPLANT, BILATERAL     COLONOSCOPY  2010   COLOSTOMY  03/2009   perforated colon from colonoscopy   COLOSTOMY REVERSAL  08/2009   CORONARY ANGIOPLASTY WITH STENT PLACEMENT  1999   DIALYSIS/PERMA CATHETER INSERTION N/A 03/08/2023   Procedure: DIALYSIS/PERMA CATHETER INSERTION;  Surgeon: Marea Selinda RAMAN, MD;  Location: [facility];  Service: Cardiovascular;  Laterality: N/A;   DIALYSIS/PERMA CATHETER REMOVAL N/A 09/05/2023   Procedure: DIALYSIS/PERMA CATHETER REMOVAL;  Surgeon: Marea Selinda RAMAN, MD;  Location: [facility];  Service: Cardiovascular;  Laterality: N/A;   FOOT SURGERY Bilateral    toes correction   KNEE ARTHROSCOPY Right    torn cartilage   KNEE ARTHROSCOPY W/ MENISCECTOMY Left 01/2012   TEMPORARY DIALYSIS CATHETER N/A 03/05/2023   Procedure: TEMPORARY DIALYSIS CATHETER;  Surgeon: Marea Selinda RAMAN, MD;  Location: [facility];  Service: Cardiovascular;  Laterality: N/A;    Social History Social History   Tobacco Use    Smoking status: Former    Current packs/day: 0.00    Types: Cigarettes    Quit date: 01/21/1980    Years since quitting: 44.3   Smokeless tobacco: Never  Vaping Use   Vaping status: Never Used  Substance Use Topics   Alcohol use: Yes    Alcohol/week: 0.0 standard drinks of alcohol    Comment: occasionally   Drug use: No    Family History Family History  Problem Relation Age of Onset   Hypertension Mother    Hypertension Father    Early death Father    Heart disease Father     Allergies  Allergen Reactions   Atorvastatin Other (See Comments)    MYALGIA   Cyclobenzaprine Other (See Comments)    hallucination   Hydrocodone -Acetaminophen      Other reaction(s):  Hallucination when taking whole pill, pt tolerates taking 1/2 pill   Mirtazapine     Other reaction(s): Hallucination   Oxycodone-Acetaminophen  Other (See Comments)    hallucination   Paroxetine Hcl Other (See Comments)    hallucination   Penicillins Swelling    Lip and orbital swelling Tolerated 3rd generation cephalosporin (CEFTRIAXONE ) between 03/05/2023 and 03/09/2023 with no documented ADRs.   Propoxyphene Other (See Comments)    hallucination   Trazodone  Other (See Comments)    hallucination     REVIEW OF SYSTEMS (Negative unless checked)  Constitutional: [] Weight loss  [] Fever  [] Chills Cardiac: [] Chest pain   [] Chest pressure   [] Palpitations   [] Shortness of breath when laying flat   [] Shortness of breath with exertion. Vascular:  [] Pain in legs with walking   [] Pain in legs at rest  [] History of DVT   [] Phlebitis   [] Swelling in legs   [] Varicose veins   [] Non-healing ulcers Pulmonary:   [] Uses home oxygen   [] Productive cough   [] Hemoptysis   [] Wheeze  [] COPD   [] Asthma Neurologic:  [] Dizziness   [] Seizures   [] History of stroke   [] History of TIA  [] Aphasia   [] Vissual changes   [] Weakness or numbness in arm   [] Weakness or numbness in leg Musculoskeletal:   [] Joint swelling   [] Joint pain   [] Low  back pain Hematologic:  [] Easy bruising  [] Easy bleeding   [] Hypercoagulable state   [] Anemic Gastrointestinal:  [] Diarrhea   [] Vomiting  [] Gastroesophageal reflux/heartburn   [] Difficulty swallowing. Genitourinary:  [x] Chronic kidney disease   [] Difficult urination  [] Frequent urination   [] Blood in urine Skin:  [] Rashes   [] Ulcers  Psychological:  [] History of anxiety   []  History of major depression.  Physical Examination  Vitals:   05/19/24 1154  BP: (!) 145/87  Pulse: 64  Weight: 123 lb 8 oz (56 kg)  Height: 5' 1 (1.549 m)   Body mass index is 23.34 kg/m. Gen: WD/WN, NAD Head: [place]/AT, No temporalis wasting.  Ear/Nose/Throat: Hearing grossly intact, nares w/o erythema or drainage Eyes: PER, EOMI, sclera nonicteric.  Neck: Supple, no gross masses or lesions.  No JVD.  Pulmonary:  Good air movement, no audible wheezing, no use of accessory muscles.  Cardiac: RRR, precordium non-hyperdynamic. Vascular:   Left upper arm AV graft with mild to moderate pulsatility the thrill persists Vessel Right Left  Radial Palpable Palpable  Brachial Palpable Palpable  Gastrointestinal: soft, non-distended. No guarding/no peritoneal signs.  Musculoskeletal: M/S 5/5 throughout.  No deformity.  Neurologic: CN 2-12 intact. Pain and light touch intact in extremities.  Symmetrical.  Speech is fluent. Motor exam as listed above. Psychiatric: Judgment intact, Mood & affect appropriate for pt's clinical situation. Dermatologic: No rashes or ulcers noted.  No changes consistent with cellulitis.   CBC Lab Results  Component Value Date   WBC 19.4 (H) 04/08/2024   HGB 14.1 04/08/2024   HCT 45.4 04/08/2024   MCV 100.4 (H) 04/08/2024   PLT 668 (H) 04/08/2024    BMET    Component Value Date/Time   NA 138 04/08/2024 0605   NA 137 12/07/2012 1641   K 5.1 04/08/2024 0605   K 4.2 12/07/2012 1641   CL 97 (L) 04/08/2024 0605   CL 106 12/07/2012 1641   CO2 28 04/08/2024 0605   CO2 27 12/07/2012  1641   GLUCOSE 115 (H) 04/08/2024 0605   GLUCOSE 75 12/07/2012 1641   BUN 42 (H) 04/08/2024 0605   BUN 18 12/07/2012 1641  CREATININE 7.32 (H) 04/08/2024 0605   CREATININE 1.13 12/07/2012 1641   CALCIUM  8.1 (L) 04/08/2024 0605   CALCIUM  8.9 12/07/2012 1641   GFRNONAA 5 (L) 04/08/2024 0605   GFRNONAA 45 (L) 12/07/2012 1641   GFRAA 25 (L) 03/30/2020 1026   GFRAA 52 (L) 12/07/2012 1641   CrCl cannot be calculated (Patient's most recent lab result is older than the maximum 21 days allowed.).  COAG Lab Results  Component Value Date   INR 1.1 12/10/2021   INR 1.0 10/12/2021   INR 1.1 05/15/2021    Radiology VAS US  DUPLEX DIALYSIS ACCESS (AVF, AVG) Result Date: 05/27/2024 DIALYSIS ACCESS Patient Name:  DAVE MANNES Debois  Date of Exam:   05/19/2024 Medical Rec #: [id number]          Accession #:    [id number] Date of Birth: 01/01/1927         Patient Gender: F Patient Age:   63 years Exam Location:  [facility] Procedure:      VAS US  DUPLEX DIALYSIS ACCESS (AVF, AVG) Referring Phys: ORVIN DARING --------------------------------------------------------------------------------  Access Site: Left Upper Extremity. Access Type: Brachial-cephalic AVF. History: 12/25/2023 PTA of outflow vein. Comparison Study: 02/29/2024 Performing Technologist: Leafy Gibes RVS  Examination Guidelines: A complete evaluation includes B-mode imaging, spectral Doppler, color Doppler, and power Doppler as needed of all accessible portions of each vessel. Unilateral testing is considered an integral part of a complete examination. Limited examinations for reoccurring indications may be performed as noted.  Findings: +--------------------+----------+-----------------+--------+ AVF                 PSV (cm/s)Flow Vol (mL/min)Comments +--------------------+----------+-----------------+--------+ Native artery inflow   121           987                 +--------------------+----------+-----------------+--------+ AVF Anastomosis        387                              +--------------------+----------+-----------------+--------+  +---------------+----------+-------------+----------+--------+ OUTFLOW VEIN   PSV (cm/s)Diameter (cm)Depth (cm)Describe +---------------+----------+-------------+----------+--------+ Subclavian vein   339                                    +---------------+----------+-------------+----------+--------+ Confluence        158                                    +---------------+----------+-------------+----------+--------+ Shoulder          132                                    +---------------+----------+-------------+----------+--------+ Prox UA            93                                    +---------------+----------+-------------+----------+--------+ Mid UA             80                                    +---------------+----------+-------------+----------+--------+  Dist UA           331                                    +---------------+----------+-------------+----------+--------+  +--------------+-------------+---------+---------+---------+-------------------+               Diameter (cm)  Depth  Branching   PSV       Flow Volume                                  (cm)             (cm/s)       (ml/min)       +--------------+-------------+---------+---------+---------+-------------------+ Lt Rad Art                                      37                        Dist                                                                      +--------------+-------------+---------+---------+---------+-------------------+  Summary: The Left Brachial Cephalic AVF appears to be patent throughout; Flow Volume appears to be Normal. There appears to be no evidence of a steal present.  *See table(s) above for measurements and observations.  Diagnosing physician: Cordella Shawl MD Electronically signed by Cordella Shawl MD on 05/27/2024 at 9:42:08 AM.   --------------------------------------------------------------------------------   Final      Assessment/Plan 1. Mechanical complication of arteriovenous surgical shunt, sequela (Primary) Recommend:  The patient is doing well and currently has adequate dialysis access.  Although there are some parameters suggesting possible future issues.  The patient's dialysis center is reporting some symptoms consistent with steal however the patient does not cooperate these.  The ultrasound does show a decrease in her flow volume with increasing stenosis in the venous outflow.  However, if there are concerns regarding steal treating her venous outflow would only amplify the steal and make it much worse.  Therefore, I will continue to follow but it 3 months rather than 6 months  However, the flow rates in the patient's dialysis access are low, in the prethrombotic range, and there is no exact stenosis identified.  This raises concerns that the access is at moderate but not high risk for a problem or thrombosis and should be followed more closely  The patient will follow-up with me in the office in 3 months.  The need for a follow up duplex ultrasound will be made at that time based on whether problems with the access are persistent.    - VAS US  DUPLEX DIALYSIS ACCESS (AVF, AVG); Future  2. ESRD (end stage renal disease) on dialysis [facility]) Recommend:  The patient is doing well and currently has adequate dialysis access.  Although there are some parameters suggesting possible future issues.  The patient's dialysis center is reporting some symptoms consistent with steal however the patient does not cooperate these.  The ultrasound does show a decrease in her flow volume with increasing stenosis in the venous outflow.  However, if there are concerns regarding steal treating her venous outflow would only amplify the steal and make it  much worse.  Therefore, I will continue to follow but it 3 months rather than 6 months  However, the flow rates in the patient's dialysis access are low, in the prethrombotic range, and there is no exact stenosis identified.  This raises concerns that the access is at moderate but not high risk for a problem or thrombosis and should be followed more closely  The patient will follow-up with me in the office in 3 months.  The need for a follow up duplex ultrasound will be made at that time based on whether problems with the access are persistent.    3. Primary hypertension Continue antihypertensive medications as already ordered, these medications have been reviewed and there are no changes at this time.  4. Coronary artery disease involving native coronary artery of native heart without angina pectoris Continue cardiac and antihypertensive medications as already ordered and reviewed, no changes at this time.  Continue statin as ordered and reviewed, no changes at this time  Nitrates PRN for chest pain  5. Type 2 diabetes mellitus with chronic kidney disease on chronic dialysis, without long-term current use of insulin  (HCC) Continue hypoglycemic medications as already ordered, these medications have been reviewed and there are no changes at this time.  Hgb A1C to be monitored as already arranged by primary service    Cordella Shawl, MD  06/01/2024 1:53 PM

## 2024-06-02 ENCOUNTER — Encounter (INDEPENDENT_AMBULATORY_CARE_PROVIDER_SITE_OTHER)

## 2024-06-02 ENCOUNTER — Ambulatory Visit (INDEPENDENT_AMBULATORY_CARE_PROVIDER_SITE_OTHER): Admitting: Vascular Surgery

## 2024-07-16 ENCOUNTER — Other Ambulatory Visit: Payer: Self-pay

## 2024-07-16 ENCOUNTER — Observation Stay
Admission: EM | Admit: 2024-07-16 | Discharge: 2024-07-18 | Disposition: A | Discharge status: Home / Self Care | Attending: Hospitalist | Admitting: Hospitalist

## 2024-07-16 ENCOUNTER — Observation Stay

## 2024-07-16 ENCOUNTER — Encounter: Payer: Self-pay | Admitting: Internal Medicine

## 2024-07-16 ENCOUNTER — Emergency Department

## 2024-07-16 DIAGNOSIS — Z992 Dependence on renal dialysis: Secondary | ICD-10-CM | POA: Insufficient documentation

## 2024-07-16 DIAGNOSIS — K5909 Other constipation: Secondary | ICD-10-CM | POA: Diagnosis present

## 2024-07-16 DIAGNOSIS — I6340 Cerebral infarction due to embolism of unspecified cerebral artery: Secondary | ICD-10-CM | POA: Diagnosis not present

## 2024-07-16 DIAGNOSIS — Z7982 Long term (current) use of aspirin: Secondary | ICD-10-CM | POA: Insufficient documentation

## 2024-07-16 DIAGNOSIS — N186 End stage renal disease: Secondary | ICD-10-CM | POA: Diagnosis not present

## 2024-07-16 DIAGNOSIS — E1122 Type 2 diabetes mellitus with diabetic chronic kidney disease: Secondary | ICD-10-CM | POA: Diagnosis not present

## 2024-07-16 DIAGNOSIS — I120 Hypertensive chronic kidney disease with stage 5 chronic kidney disease or end stage renal disease: Secondary | ICD-10-CM | POA: Insufficient documentation

## 2024-07-16 DIAGNOSIS — I63411 Cerebral infarction due to embolism of right middle cerebral artery: Principal | ICD-10-CM | POA: Insufficient documentation

## 2024-07-16 DIAGNOSIS — R2689 Other abnormalities of gait and mobility: Secondary | ICD-10-CM | POA: Insufficient documentation

## 2024-07-16 DIAGNOSIS — K5904 Chronic idiopathic constipation: Secondary | ICD-10-CM | POA: Diagnosis not present

## 2024-07-16 DIAGNOSIS — E782 Mixed hyperlipidemia: Secondary | ICD-10-CM | POA: Insufficient documentation

## 2024-07-16 DIAGNOSIS — Z79899 Other long term (current) drug therapy: Secondary | ICD-10-CM | POA: Diagnosis not present

## 2024-07-16 DIAGNOSIS — I2510 Atherosclerotic heart disease of native coronary artery without angina pectoris: Secondary | ICD-10-CM | POA: Insufficient documentation

## 2024-07-16 DIAGNOSIS — D45 Polycythemia vera: Secondary | ICD-10-CM | POA: Diagnosis not present

## 2024-07-16 DIAGNOSIS — I639 Cerebral infarction, unspecified: Secondary | ICD-10-CM | POA: Diagnosis present

## 2024-07-16 DIAGNOSIS — E039 Hypothyroidism, unspecified: Secondary | ICD-10-CM | POA: Insufficient documentation

## 2024-07-16 DIAGNOSIS — G2581 Restless legs syndrome: Secondary | ICD-10-CM | POA: Insufficient documentation

## 2024-07-16 DIAGNOSIS — D72829 Elevated white blood cell count, unspecified: Secondary | ICD-10-CM | POA: Insufficient documentation

## 2024-07-16 DIAGNOSIS — R531 Weakness: Secondary | ICD-10-CM | POA: Diagnosis present

## 2024-07-16 DIAGNOSIS — Z87891 Personal history of nicotine dependence: Secondary | ICD-10-CM | POA: Diagnosis not present

## 2024-07-16 DIAGNOSIS — I10 Essential (primary) hypertension: Secondary | ICD-10-CM | POA: Diagnosis present

## 2024-07-16 LAB — BASIC METABOLIC PANEL WITH GFR
Anion gap: 20 — ABNORMAL HIGH (ref 5–15)
BUN: 51 mg/dL — ABNORMAL HIGH (ref 8–23)
CO2: 25 mmol/L (ref 22–32)
Calcium: 7.2 mg/dL — ABNORMAL LOW (ref 8.9–10.3)
Chloride: 98 mmol/L (ref 98–111)
Creatinine, Ser: 7.66 mg/dL — ABNORMAL HIGH (ref 0.44–1.00)
GFR, Estimated: 5 mL/min — ABNORMAL LOW (ref >60)
Glucose, Bld: 72 mg/dL (ref 70–99)
Potassium: 4.9 mmol/L (ref 3.5–5.1)
Sodium: 143 mmol/L (ref 135–145)

## 2024-07-16 LAB — CBC
HCT: 45.9 % (ref 36.0–46.0)
Hemoglobin: 14.2 g/dL (ref 12.0–15.0)
MCH: 31.1 pg (ref 26.0–34.0)
MCHC: 30.9 g/dL (ref 30.0–36.0)
MCV: 100.4 fL — ABNORMAL HIGH (ref 80.0–100.0)
Platelets: 504 K/uL — ABNORMAL HIGH (ref 150–400)
RBC: 4.57 MIL/uL (ref 3.87–5.11)
RDW: 17 % — ABNORMAL HIGH (ref 11.5–15.5)
WBC: 21.8 K/uL — ABNORMAL HIGH (ref 4.0–10.5)
nRBC: 0 % (ref 0.0–0.2)

## 2024-07-16 MED ORDER — HYDROCODONE-ACETAMINOPHEN 5-325 MG PO TABS
1.0000 | ORAL_TABLET | Freq: Four times a day (QID) | ORAL | Status: DC | PRN
Start: 2024-07-16 — End: 2024-07-18
  Administered 2024-07-16: 1 via ORAL
  Filled 2024-07-16: qty 1

## 2024-07-16 MED ORDER — ALPRAZOLAM 0.25 MG PO TABS
0.2500 mg | ORAL_TABLET | Freq: Every day | ORAL | Status: DC | PRN
  Administered 2024-07-16 – 2024-07-18 (×3): 0.25 mg via ORAL
  Filled 2024-07-16 (×3): qty 1

## 2024-07-16 MED ORDER — GABAPENTIN 100 MG PO CAPS: 100.0000 mg | ORAL_CAPSULE | Freq: Three times a day (TID) | ORAL | Status: DC

## 2024-07-16 MED ORDER — TRAZODONE HCL 50 MG PO TABS
50.0000 mg | ORAL_TABLET | Freq: Every day | ORAL | Status: DC
  Administered 2024-07-16 – 2024-07-17 (×2): 50 mg via ORAL
  Filled 2024-07-16 (×2): qty 1

## 2024-07-16 MED ORDER — TIMOLOL MALEATE 0.5 % OP SOLN
1.0000 [drp] | Freq: Every morning | OPHTHALMIC | Status: DC
Start: 2024-07-17 — End: 2024-07-18
  Administered 2024-07-18: 1 [drp] via OPHTHALMIC
  Filled 2024-07-16: qty 5

## 2024-07-16 MED ORDER — SENNOSIDES-DOCUSATE SODIUM 8.6-50 MG PO TABS: 1.0000 | ORAL_TABLET | Freq: Every evening | ORAL | Status: DC | PRN

## 2024-07-16 MED ORDER — ROSUVASTATIN CALCIUM 10 MG PO TABS
10.0000 mg | ORAL_TABLET | Freq: Every day | ORAL | Status: DC
  Administered 2024-07-16 – 2024-07-17 (×2): 10 mg via ORAL
  Filled 2024-07-16 (×2): qty 1

## 2024-07-16 MED ORDER — HYDRALAZINE HCL 20 MG/ML IJ SOLN: 5.0000 mg | Freq: Four times a day (QID) | INTRAMUSCULAR | Status: DC | PRN

## 2024-07-16 MED ORDER — TIMOLOL MALEATE 0.5 % OP SOLG: 1.0000 [drp] | Freq: Every morning | OPHTHALMIC | Status: DC

## 2024-07-16 MED ORDER — ACETAMINOPHEN 325 MG PO TABS: 650.0000 mg | ORAL_TABLET | ORAL | Status: DC | PRN

## 2024-07-16 MED ORDER — LEVOTHYROXINE SODIUM 88 MCG PO TABS
88.0000 ug | ORAL_TABLET | Freq: Every day | ORAL | Status: DC
  Administered 2024-07-17 – 2024-07-18 (×2): 88 ug via ORAL
  Filled 2024-07-16 (×2): qty 1

## 2024-07-16 MED ORDER — CLONIDINE HCL 0.1 MG PO TABS: 0.1000 mg | ORAL_TABLET | Freq: Two times a day (BID) | ORAL | Status: DC

## 2024-07-16 MED ORDER — SERTRALINE HCL 50 MG PO TABS
50.0000 mg | ORAL_TABLET | Freq: Every day | ORAL | Status: DC
  Administered 2024-07-17 – 2024-07-18 (×2): 50 mg via ORAL
  Filled 2024-07-16 (×2): qty 1

## 2024-07-16 MED ORDER — ASPIRIN 325 MG PO TABS
325.0000 mg | ORAL_TABLET | Freq: Once | ORAL | Status: AC
  Administered 2024-07-16: 325 mg via ORAL
  Filled 2024-07-16 (×2): qty 1

## 2024-07-16 MED ORDER — AMLODIPINE BESYLATE 5 MG PO TABS: 5.0000 mg | ORAL_TABLET | Freq: Every day | ORAL | Status: DC

## 2024-07-16 MED ORDER — HYDRALAZINE HCL 20 MG/ML IJ SOLN
5.0000 mg | Freq: Four times a day (QID) | INTRAMUSCULAR | Status: AC | PRN
Start: 2024-07-16 — End: 2024-07-17

## 2024-07-16 MED ORDER — LATANOPROST 0.005 % OP SOLN
1.0000 [drp] | Freq: Every day | OPHTHALMIC | Status: DC
  Administered 2024-07-16 – 2024-07-17 (×2): 1 [drp] via OPHTHALMIC
  Filled 2024-07-16: qty 2.5

## 2024-07-16 MED ORDER — CHLORHEXIDINE GLUCONATE CLOTH 2 % EX PADS
6.0000 | MEDICATED_PAD | Freq: Every day | CUTANEOUS | Status: DC
  Administered 2024-07-17 – 2024-07-18 (×2): 6 via TOPICAL

## 2024-07-16 MED ORDER — ROPINIROLE HCL 0.25 MG PO TABS
0.5000 mg | ORAL_TABLET | Freq: Two times a day (BID) | ORAL | Status: DC
  Administered 2024-07-16 – 2024-07-18 (×5): 0.5 mg via ORAL
  Filled 2024-07-16 (×6): qty 2

## 2024-07-16 MED ORDER — ASPIRIN 81 MG PO CHEW
324.0000 mg | CHEWABLE_TABLET | Freq: Once | ORAL | Status: DC
  Filled 2024-07-16: qty 4

## 2024-07-16 MED ORDER — CALCIUM ACETATE (PHOS BINDER) 667 MG PO CAPS
1334.0000 mg | ORAL_CAPSULE | Freq: Three times a day (TID) | ORAL | Status: DC
  Administered 2024-07-16 – 2024-07-18 (×5): 1334 mg via ORAL
  Filled 2024-07-16 (×6): qty 2

## 2024-07-16 MED ORDER — GABAPENTIN 100 MG PO CAPS
200.0000 mg | ORAL_CAPSULE | Freq: Every day | ORAL | Status: DC
Start: 2024-07-16 — End: 2024-07-18
  Administered 2024-07-16 – 2024-07-17 (×2): 200 mg via ORAL
  Filled 2024-07-16 (×2): qty 2

## 2024-07-16 MED ORDER — CLOPIDOGREL BISULFATE 75 MG PO TABS
75.0000 mg | ORAL_TABLET | Freq: Every day | ORAL | Status: DC
  Administered 2024-07-16 – 2024-07-18 (×3): 75 mg via ORAL
  Filled 2024-07-16 (×3): qty 1

## 2024-07-16 MED ORDER — ASPIRIN 81 MG PO TBEC
81.0000 mg | DELAYED_RELEASE_TABLET | Freq: Every day | ORAL | Status: DC
  Administered 2024-07-17 – 2024-07-18 (×2): 81 mg via ORAL
  Filled 2024-07-16 (×2): qty 1

## 2024-07-16 MED ORDER — ACETAMINOPHEN 160 MG/5ML PO SOLN: 650.0000 mg | ORAL | Status: DC | PRN

## 2024-07-16 MED ORDER — GABAPENTIN 100 MG PO CAPS
100.0000 mg | ORAL_CAPSULE | Freq: Two times a day (BID) | ORAL | Status: DC
  Administered 2024-07-16 – 2024-07-18 (×4): 100 mg via ORAL
  Filled 2024-07-16 (×4): qty 1

## 2024-07-16 MED ORDER — ACETAMINOPHEN 650 MG RE SUPP: 650.0000 mg | RECTAL | Status: DC | PRN

## 2024-07-16 MED ORDER — STROKE: EARLY STAGES OF RECOVERY BOOK: Freq: Once | Status: AC

## 2024-07-16 NOTE — Progress Notes (Signed)
 Neurology consulted for acute ischemic stroke, favored to be cardioembolic based on involvement of multiple vascular distributions. Patient will be seen in formal consultation tomorrow. In the meantime I have reviewed her chart and added on orders for MRA head wo, carotid US , and plavix 75mg  daily.  Cassandra Ross, MD [facility] [phone]  If 7pm- 7am, please page neurology on call as listed in AMION.

## 2024-07-16 NOTE — Assessment & Plan Note (Signed)
 Home ropinirole  0.5 mg p.o. twice daily resumed

## 2024-07-16 NOTE — Assessment & Plan Note (Addendum)
 Home aspirin  81 mg daily resumed, rosuvastatin  20 mg nightly resumed

## 2024-07-16 NOTE — ED Triage Notes (Signed)
 Patient brought in via [facility] from home with complaints of left arm weakness. Patient receives dialysis Tues, Thurs, Sat. On Tuesday she was unable to finish dialysis after falling asleep and she states the dialysis needle fell out of her arm. Since then she states she has had some numbnes/tingling and now feels weak in the left arm only.

## 2024-07-16 NOTE — Assessment & Plan Note (Addendum)
 Home levothyroxine  88 mcg daily resume

## 2024-07-16 NOTE — Assessment & Plan Note (Signed)
-   Rosuvastatin 20 mg nightly resumed 

## 2024-07-16 NOTE — Assessment & Plan Note (Addendum)
 Home scheduled antihypertensive medication will not be resumed on admission Permissive hypertension Hydralazine  5 mg IV every 6 hours as needed for SBP greater 180, 2 days ordered

## 2024-07-16 NOTE — H&P (Addendum)
 History and Physical   Cassandra [name] FMW:[id number] DOB: 09-05-1927 DOA: 07/16/2024  PCP: Cleotilde Oneil FALCON, MD  Patient coming from: Home via EMS  I have personally briefly reviewed patient's old medical records in [facility] EMR.  Chief Concern: Left-sided weakness  HPI: [name] is a 89 year old female with history of end-stage renal disease on hemodialysis Tuesday Thursday Saturday, hypertension, depression with anxiety, neuropathy, restless leg syndrome, hyperlipidemia, insomnia, who presents ED for chief concerns of left arm weakness.  Vitals in the ED showed t of 98.6, rr of 14, hr 65, blood pressure 155/60, SpO2 of 89% on room air.  Serum sodium is 143, potassium 4.9, chloride 98, bicarb 25, BUN of 51, serum creatinine 7.66, eGFR 5, nonfasting blood glucose 72, WBC 21.8, hemoglobin 14.2, platelets of 504.  ED treatment: Aspirin  324 mg p.o. one-time dose. -------------------------------- At bedside, patient was able to tell me her first and last name, age, location, current calendar year.  She reports that her left arm has been weak since yesterday.  She reports it started to get weak after dialysis session on Tuesday.  She reports that she fell asleep during dialysis and the needle fell out of her arm and she started bleeding everywhere.  She reports today stop dialysis 10 minutes early yesterday.  Since then, she has had numbness and tingling and weakness of her left arm.  She reports has never had this before.  She denies difficulty walking or speaking.  She denies known trauma to her person.  She denies chest pain, shortness of breath, abdominal pain, diarrhea, swelling of her lower extremities, syncope, vision changes, cough, nausea, vomiting.  Social history: Patient lives at home with her husband.  She denies tobacco, EtOH, recreational drug use.  ROS: Constitutional: no weight change, no fever ENT/Mouth: no sore throat, no rhinorrhea Eyes: no eye pain, no  vision changes Cardiovascular: no chest pain, no dyspnea,  no edema, no palpitations Respiratory: no cough, no sputum, no wheezing Gastrointestinal: no nausea, no vomiting, no diarrhea, no constipation Genitourinary: no urinary incontinence, no dysuria, no hematuria Musculoskeletal: no arthralgias, + left arm weakness Skin: no skin lesions, no pruritus, Neuro: + weakness, no loss of consciousness, no syncope Psych: no anxiety, no depression, no decrease appetite Heme/Lymph: no bruising, no bleeding  ED Course: Discussed with EDP, patient requiring hospitalization for chief concerns of stroke.  Assessment/Plan  Principal Problem:   Stroke [facility]) Active Problems:   Restless legs syndrome   Leukocytosis   Hypertension   Chronic constipation   Acquired hypothyroidism   CAD (coronary artery disease), native coronary artery   Polycythemia vera (HCC)   Hyperlipidemia, mixed   ESRD on dialysis (HCC)   Assessment and Plan:  * Stroke (HCC) Scattered small foci of diffusion abnormality in both cerebral hemispheres suspicious for acute to subacute embolic infarcts Neurologist, Dr. Matthews has been consulted and we appreciate further recommendations Complete echo with bubble study Fasting lipid and A1c ordered Permissive hypertension Frequent neuro vascular checks N.p.o. pending swallow study PT, OT Fall precaution   Restless legs syndrome Home ropinirole  0.5 mg p.o. twice daily resumed  ESRD on dialysis [facility]) Nephrology has been consulted via Epic order and secure chat  Hyperlipidemia, mixed Rosuvastatin  20 mg nightly resumed  CAD (coronary artery disease), native coronary artery Home aspirin  81 mg daily resumed, rosuvastatin  20 mg nightly resumed  Acquired hypothyroidism Home levothyroxine  88 mcg daily resume  Hypertension Home scheduled antihypertensive medication will not be resumed on admission Permissive hypertension  Hydralazine  5 mg IV every 6 hours as needed for SBP  greater 180, 2 days ordered  Chart reviewed.   DVT prophylaxis: Pharmacologic DVT not initiated on admission.  AM team to initiate pharmacologic DVT when the benefits outweigh the risk.  Code Status: Full code Diet: Renal Family Communication: A phone call was offered, patient declined stating that her husband already knows she is staying in the hospital for today because of a stroke Disposition Plan: Pending clinical course Consults called: EDP consulted neurology, Dr. Matthews and vascular, Dr. Jama Admission status: Telemetry medical, observation  Past Medical History:  Diagnosis Date   Anxiety    Aortic atherosclerosis    Arthritis    osteoarthritis in hands, knees ( R torn cartilage, L meniscus removed)    Atherosclerosis of native coronary artery of native heart with unstable angina pectoris (HCC)    CAD (coronary artery disease) 1999   a.) s/p PCI (details unkown) in [place]  in 1999   Campylobacter antigen positive 11/29/2014   a.) presented to [facility] ED with N/V/D; stool culture (+)   ESRD on hemodialysis (HCC)    a.) T-Th-Sat   Glaucoma    Hypercholesterolemia    Hyperlipidemia    Hypertension    Hypothyroidism    Lumbar stenosis with neurogenic claudication    Perforation of colon as colonoscopy complication (HCC) 2010   a.) required short term colostomy with subsequent take down/reversal   RLS (restless legs syndrome)    Thrombocytosis    Type 2 diabetes mellitus with chronic kidney disease (HCC)    Past Surgical History:  Procedure Laterality Date   A/V FISTULAGRAM Left 12/25/2023   Procedure: A/V Fistulagram;  Surgeon: Jama Cordella MATSU, MD;  Location: [facility] INVASIVE CV LAB;  Service: Cardiovascular;  Laterality: Left;   ABDOMINAL HYSTERECTOMY     left ovary removed   AV FISTULA PLACEMENT Left 05/25/2023   Procedure: ARTERIOVENOUS (AV) FISTULA CREATION (BRACHIALCEPHALIC);  Surgeon: Jama Cordella MATSU, MD;  Location: [facility] ORS;  Service: Vascular;  Laterality:  Left;   BLEPHAROPLASTY Bilateral    CATARACT EXTRACTION W/ INTRAOCULAR LENS  IMPLANT, BILATERAL     COLONOSCOPY  2010   COLOSTOMY  03/2009   perforated colon from colonoscopy   COLOSTOMY REVERSAL  08/2009   CORONARY ANGIOPLASTY WITH STENT PLACEMENT  1999   DIALYSIS/PERMA CATHETER INSERTION N/A 03/08/2023   Procedure: DIALYSIS/PERMA CATHETER INSERTION;  Surgeon: Marea Selinda RAMAN, MD;  Location: [facility] INVASIVE CV LAB;  Service: Cardiovascular;  Laterality: N/A;   DIALYSIS/PERMA CATHETER REMOVAL N/A 09/05/2023   Procedure: DIALYSIS/PERMA CATHETER REMOVAL;  Surgeon: Marea Selinda RAMAN, MD;  Location: [facility] INVASIVE CV LAB;  Service: Cardiovascular;  Laterality: N/A;   FOOT SURGERY Bilateral    toes correction   KNEE ARTHROSCOPY Right    torn cartilage   KNEE ARTHROSCOPY W/ MENISCECTOMY Left 01/2012   TEMPORARY DIALYSIS CATHETER N/A 03/05/2023   Procedure: TEMPORARY DIALYSIS CATHETER;  Surgeon: Marea Selinda RAMAN, MD;  Location: [facility] INVASIVE CV LAB;  Service: Cardiovascular;  Laterality: N/A;   Social History:  reports that she quit smoking about 44 years ago. Her smoking use included cigarettes. She has never used smokeless tobacco. She reports current alcohol use. She reports that she does not use drugs.  Allergies  Allergen Reactions   Atorvastatin Other (See Comments)    MYALGIA   Cyclobenzaprine Other (See Comments)    hallucination   Mirtazapine     Other reaction(s): Hallucination   Oxycodone-Acetaminophen  Other (See Comments)  hallucination   Paroxetine Hcl Other (See Comments)    hallucination   Penicillins Swelling    Lip and orbital swelling Tolerated 3rd generation cephalosporin (CEFTRIAXONE ) between 03/05/2023 and 03/09/2023 with no documented ADRs.   Propoxyphene Other (See Comments)    hallucination   Trazodone  Other (See Comments)    hallucination   Family History  Problem Relation Age of Onset   Hypertension Mother    Hypertension Father    Early death Father    Heart  disease Father    Family history: Family history reviewed and not pertinent.  Prior to Admission medications   Medication Sig Start Date End Date Taking? Authorizing Provider  cloNIDine  (CATAPRES ) 0.1 MG tablet Take 0.1 mg by mouth 2 (two) times daily. 06/25/24  Yes [provider]  FLUoxetine (PROZAC) 20 MG capsule Take 20 mg by mouth daily. 04/15/24  Yes [provider]  hydrOXYzine (ATARAX) 10 MG tablet Take 10 mg by mouth daily as needed. 07/03/24  Yes [provider]  naloxone (NARCAN) nasal spray 4 mg/0.1 mL Place 4 mg into the nose once. 06/23/24  Yes [provider]  ALPRAZolam  (XANAX ) 0.25 MG tablet Take 1 tablet (0.25 mg total) by mouth daily as needed. 03/12/23   Alexander, Natalie, DO  amLODipine  (NORVASC ) 5 MG tablet Take 5 mg by mouth daily.    [provider]  aspirin  EC 81 MG EC tablet Take 1 tablet (81 mg total) by mouth daily. Swallow whole. 10/16/21   Von Bellis, MD  betamethasone valerate (VALISONE) 0.1 % cream Apply 1 Application topically daily.    [provider]  calcium  acetate (PHOSLO ) 667 MG capsule Take 1,334 mg by mouth 3 (three) times daily. 06/30/23   [provider]  cetirizine (ZYRTEC) 10 MG tablet Take 1 tablet by mouth daily as needed for allergies.    [provider]  [employer] D3 25 MCG (1000 UT) capsule Take 1,000 Units by mouth daily. 02/21/23   [provider]  DROXIA  300 MG capsule TAKE 1 CAPSULE (300 MG TOTAL) BY MOUTH DAILY. MAY TAKE WITH FOOD TO MINIMIZE GI SIDE EFFECTS. 04/23/20   Dasie Tinnie MATSU, NP  gabapentin  (NEURONTIN ) 100 MG capsule Take 1-2 capsules (100-200 mg total) by mouth 3 (three) times daily. 100 mg am 100 mg afternoon 200 mg bedtime. Home med. 04/10/24   Awanda City, MD  glucose blood test strip Use once daily. Use as instructed. 02/21/17   [provider]  HYDROcodone -acetaminophen  (NORCO/VICODIN) 5-325 MG tablet Take 1 tablet by mouth every 6 (six) hours as  needed for moderate pain (pain score 4-6) or severe pain (pain score 7-10).    [provider]  latanoprost  (XALATAN ) 0.005 % ophthalmic solution Place 1 drop into both eyes at bedtime. 09/13/15   [provider]  levothyroxine  (SYNTHROID ) 88 MCG tablet Take 88 mcg by mouth daily. Take on an empty stomach 30 to 60 minutes before breakfast 12/08/14   [provider]  lidocaine -prilocaine  (EMLA ) cream Apply 1 Application topically as needed. 03/24/24   Gregg Lek, MD  Nutritional Supplements (FEEDING SUPPLEMENT, NEPRO CARB STEADY,) LIQD Take 237 mLs by mouth 2 (two) times daily between meals. 03/12/23   Alexander, Natalie, DO  rOPINIRole  (REQUIP ) 0.5 MG tablet Take 1 tablet (0.5 mg total) by mouth in the morning and at bedtime. 08/22/23 08/16/24  Camara, Amadou, MD  rosuvastatin  (CRESTOR ) 20 MG tablet Take 20 mg by mouth daily. 01/07/15   [provider]  sertraline (ZOLOFT) 50  MG tablet Take 50 mg by mouth daily.    [provider]  timolol  (TIMOPTIC -XR) 0.5 % ophthalmic gel-forming Place 1 drop into both eyes every morning. 05/06/21   [provider]  traZODone  (DESYREL ) 50 MG tablet Take 1 tablet (50 mg total) by mouth at bedtime. 03/24/24   Gregg Lek, MD   Physical Exam: Vitals:   07/16/24 1200 07/16/24 1230 07/16/24 1237 07/16/24 1400  BP: (!) 148/58 137/63  (!) 148/67  Pulse: 61 (!) 58  74  Resp: 18 (!) 22  17  Temp:   98 F (36.7 C)   TempSrc:   Oral   SpO2: 92% 92%  90%  Weight:      Height:       Constitutional: appears age-appropriate, frail, NAD, calm Eyes: PERRL, lids and conjunctivae normal ENMT: Mucous membranes are moist. Posterior pharynx clear of any exudate or lesions. Age-appropriate dentition. Hearing appropriate Neck: normal, supple, no masses, no thyromegaly Respiratory: clear to auscultation bilaterally, no wheezing, no crackles. Normal respiratory effort. No accessory muscle use.  Cardiovascular: Regular rate  and rhythm, no murmurs / rubs / gallops. No extremity edema. 2+ pedal pulses. No carotid bruits.  Fistula with bruit and mild swelling of the right upper extremity Abdomen: no tenderness, no masses palpated, no hepatosplenomegaly. Bowel sounds positive.  Musculoskeletal: no clubbing / cyanosis. No joint deformity upper and lower extremities. Good ROM, no contractures, no atrophy. Normal muscle tone.  Skin: no rashes, lesions, ulcers. No induration Neurologic: Sensation intact. Strength 5/5 in all 4.  Psychiatric: Normal judgment and insight. Alert and oriented x 3. Normal mood.   EKG: independently reviewed, showing sinus rhythm with rate of 67, QTc 450  Chest x-ray on Admission: I personally reviewed and I agree with radiologist reading as below.  MR Cervical Spine Wo Contrast Result Date: 07/16/2024 EXAM: MRI CERVICAL SPINE WITHOUT CONTRAST 07/16/2024 08:38:03 AM TECHNIQUE: Multiplanar multisequence MRI of the cervical spine was performed. COMPARISON: Cervical spine CT 02/13/2022 and MRI 11/28/2021. CLINICAL HISTORY: Cervical radiculopathy, left arm weakness, numbness/tingling, history of dialysis. FINDINGS: LIMITATIONS: The examination is motion degraded, severely so on axial images. The patient would not tolerate any attempts at repeat imaging. BONES AND ALIGNMENT: Chronic borderline widening of the atlantodental interval measuring 3 mm, unchanged from the prior MRI. Normal alignment elsewhere. No acute fracture or suspicious marrow lesion in the cervical spine. T5 compression fracture with severe anterior vertebral body height loss and mild to moderate marrow edema, new from a 03/05/2023 chest CT. SPINAL CORD: Grossly normal cervical spinal cord signal within limitations of motion artifact. SOFT TISSUES: No paraspinal mass. DISC LEVELS: Mild cervical spondylosis is similar to the prior MRI with preserved disc space heights. A chronic left paracentral disc protrusion at C2-C3 results in slight left  ventral cord flattening. No significant cervical spinal canal stenosis is present. Mild to moderate multilevel neural foraminal stenosis is grossly similar to the prior MRI with assessment limited by motion. IMPRESSION: 1. Motion degraded examination without an acute finding in the cervical spine. 2. T5 compression fracture with severe anterior height loss and mild to moderate marrow edema, new from 2024 and potentially subacute. 3. Mild cervical spondylosis without significant spinal stenosis. Electronically signed by: Dasie Hamburg MD 07/16/2024 09:29 AM EDT RP Workstation: [id number]   MR BRAIN WO CONTRAST Result Date: 07/16/2024 EXAM: MRI BRAIN WITHOUT CONTRAST 07/16/2024 08:38:03 AM TECHNIQUE: Multiplanar multisequence MRI of the head/brain was performed without the administration of intravenous contrast. COMPARISON: Head CT 05/02/2022 and MRI  10/12/2021. CLINICAL HISTORY: Neuro deficit, acute, stroke suspected; L arm weakness started yesterday. Patient receives dialysis. FINDINGS: The examination is mildly to moderately motion degraded. BRAIN AND VENTRICLES: There is a 1.6 cm focus of cortically based restricted diffusion and T2 hyperintensity in the posteroinferior right occipital lobe, and there is a 1 cm focus of milder diffusion weighted signal abnormality and T2 hyperintensity in the right temporal lobe. Additional punctate foci of mildly restricted diffusion are present in the right occipital lobe, left temporal lobe, left caudate head, posterior right insula, and posterior right frontal lobe. No intracranial hemorrhage, mass, hydrocephalus, midline shift, or extra axial fluid collection is identified. There is mild cerebral atrophy. Patchy T2 hyperintensities in the cerebral white matter bilaterally have mildly progressed from the prior MRI and are nonspecific but compatible with mild chronic small vessel ischemic disease. A focus of susceptibility in the right sylvian fissure corresponds to likely  vascular calcification on CT. Major intracranial vascular flow voids are preserved. ORBITS: Bilateral cataract extraction. SINUSES AND MASTOIDS: No acute abnormality. BONES AND SOFT TISSUES: Normal marrow signal. No acute soft tissue abnormality. IMPRESSION: 1. Scattered small foci of diffusion abnormality in both cerebral hemispheres suspicious for acute to subacute embolic infarcts. 2. Mild chronic small vessel ischemic disease. Electronically signed by: Dasie Hamburg MD 07/16/2024 09:15 AM EDT RP Workstation: [id number]   Labs on Admission: I have personally reviewed following labs  CBC: Recent Labs  Lab 07/16/24 0744  WBC 21.8*  HGB 14.2  HCT 45.9  MCV 100.4*  PLT 504*   Basic Metabolic Panel: Recent Labs  Lab 07/16/24 0744  NA 143  K 4.9  CL 98  CO2 25  GLUCOSE 72  BUN 51*  CREATININE 7.66*  CALCIUM  7.2*   GFR: Estimated Creatinine Clearance: 3.7 mL/min (A) (by C-G formula based on SCr of 7.66 mg/dL (H)).  Urine analysis:    Component Value Date/Time   COLORURINE STRAW (A) 12/12/2021 1702   APPEARANCEUR CLEAR (A) 12/12/2021 1702   LABSPEC 1.005 12/12/2021 1702   PHURINE 7.0 12/12/2021 1702   GLUCOSEU 50 (A) 12/12/2021 1702   HGBUR MODERATE (A) 12/12/2021 1702   BILIRUBINUR NEGATIVE 12/12/2021 1702   KETONESUR NEGATIVE 12/12/2021 1702   PROTEINUR 100 (A) 12/12/2021 1702   NITRITE NEGATIVE 12/12/2021 1702   LEUKOCYTESUR SMALL (A) 12/12/2021 1702   This document was prepared using Dragon Voice Recognition software and may include unintentional dictation errors.  Dr. Sherre [facility]  If 7PM-7AM, please contact overnight-coverage provider If 7AM-7PM, please contact day attending provider [email or web address]  07/16/2024, 3:03 PM

## 2024-07-16 NOTE — Assessment & Plan Note (Signed)
 Nephrology has been consulted via Epic order and secure chat

## 2024-07-16 NOTE — ED Provider Notes (Signed)
 [facility] Provider Note    Event Date/Time   First MD Initiated Contact with Patient 07/16/24 0710     (approximate)   History   Extremity Weakness   HPI  Cassandra [name] is a 89 y.o. female history of end-stage renal disease  Patient had dialysis Tuesday at about 6:30 in the morning.  She reports toward the end of the session she fell asleep for about 25 minutes.  When she woke up with dialysis needle was hanging out of her arm and it was bleeding.  They stopped her session about 10 minutes early.  She noticed since that time that she has had numbness and difficulty using the left hand.  She feels weak in the hand only.  Both she and her husband reports she has been speaking normally she is not having weakness in her legs or face, her speech has been clear but she reports from the left arm down to the left hand she has just a little bit of strength, but mostly very weak to the point she cannot use the left hand or really bend at the left elbow     Physical Exam   Triage Vital Signs: ED Triage Vitals  Encounter Vitals Group     BP 07/16/24 0626 (!) 121/57     Girls Systolic BP Percentile --      Girls Diastolic BP Percentile --      Boys Systolic BP Percentile --      Boys Diastolic BP Percentile --      Pulse Rate 07/16/24 0626 72     Resp 07/16/24 0626 20     Temp 07/16/24 0626 98.6 F (37 C)     Temp src --      SpO2 07/16/24 0626 (!) 89 %     Weight 07/16/24 0627 127 lb (57.6 kg)     Height 07/16/24 0627 5' 1 (1.549 m)     Head Circumference --      Peak Flow --      Pain Score 07/16/24 0627 0     Pain Loc --      Pain Education --      Exclude from Growth Chart --     Most recent vital signs: Vitals:   07/16/24 1000 07/16/24 1030  BP: 139/61 (!) 155/60  Pulse:  64  Resp: 17 19  Temp:    SpO2:  94%   Currently on 2 L nasal cannula satting mid 90s.  Patient reports that when she is at home at rest she uses oxygen as needed, has  had no shortness of breath or difficulty breathing.  Relates her breathing feels very normal to her  General: Awake, no distress.  Very pleasant CV:  Good peripheral perfusion.  Normal tones and rate Resp:  Normal effort.  Clear bilateral Abd:  No distention.  Soft nontender Other:    Extraocular moods are normal.  Speech is clear.  No facial droop.  Does shrug his shoulder but the left upper extremity shows significant weakness especially with flexion at the left elbow flexion extension of the left hand and weakness in the fingers.  Able to distinguish touch though across the extremity.  She has better strength with elbow extension.  5-5 strength in both lower extremities.  No pronator drift except in the left upper extremity which she cannot raise against gravity     ED Results / Procedures / Treatments   Labs (all labs ordered are  listed, but only abnormal results are displayed) Labs Reviewed  CBC - Abnormal; Notable for the following components:      Result Value   WBC 21.8 (*)    MCV 100.4 (*)    RDW 17.0 (*)    Platelets 504 (*)    All other components within normal limits  BASIC METABOLIC PANEL WITH GFR - Abnormal; Notable for the following components:   BUN 51 (*)    Creatinine, Ser 7.66 (*)    Calcium  7.2 (*)    GFR, Estimated 5 (*)    Anion gap 20 (*)    All other components within normal limits     EKG  Interpreted by me at 845 heart rate 70 QRS 80 QTc 450 Normal sinus rhythm no evidence of acute ischemia   RADIOLOGY  MR Cervical Spine Wo Contrast Result Date: 07/16/2024 EXAM: MRI CERVICAL SPINE WITHOUT CONTRAST 07/16/2024 08:38:03 AM TECHNIQUE: Multiplanar multisequence MRI of the cervical spine was performed. COMPARISON: Cervical spine CT 02/13/2022 and MRI 11/28/2021. CLINICAL HISTORY: Cervical radiculopathy, left arm weakness, numbness/tingling, history of dialysis. FINDINGS: LIMITATIONS: The examination is motion degraded, severely so on axial images. The  patient would not tolerate any attempts at repeat imaging. BONES AND ALIGNMENT: Chronic borderline widening of the atlantodental interval measuring 3 mm, unchanged from the prior MRI. Normal alignment elsewhere. No acute fracture or suspicious marrow lesion in the cervical spine. T5 compression fracture with severe anterior vertebral body height loss and mild to moderate marrow edema, new from a 03/05/2023 chest CT. SPINAL CORD: Grossly normal cervical spinal cord signal within limitations of motion artifact. SOFT TISSUES: No paraspinal mass. DISC LEVELS: Mild cervical spondylosis is similar to the prior MRI with preserved disc space heights. A chronic left paracentral disc protrusion at C2-C3 results in slight left ventral cord flattening. No significant cervical spinal canal stenosis is present. Mild to moderate multilevel neural foraminal stenosis is grossly similar to the prior MRI with assessment limited by motion. IMPRESSION: 1. Motion degraded examination without an acute finding in the cervical spine. 2. T5 compression fracture with severe anterior height loss and mild to moderate marrow edema, new from 2024 and potentially subacute. 3. Mild cervical spondylosis without significant spinal stenosis. Electronically signed by: Dasie Hamburg MD 07/16/2024 09:29 AM EDT RP Workstation: [id number]   MR BRAIN WO CONTRAST Result Date: 07/16/2024 EXAM: MRI BRAIN WITHOUT CONTRAST 07/16/2024 08:38:03 AM TECHNIQUE: Multiplanar multisequence MRI of the head/brain was performed without the administration of intravenous contrast. COMPARISON: Head CT 05/02/2022 and MRI 10/12/2021. CLINICAL HISTORY: Neuro deficit, acute, stroke suspected; L arm weakness started yesterday. Patient receives dialysis. FINDINGS: The examination is mildly to moderately motion degraded. BRAIN AND VENTRICLES: There is a 1.6 cm focus of cortically based restricted diffusion and T2 hyperintensity in the posteroinferior right occipital lobe, and  there is a 1 cm focus of milder diffusion weighted signal abnormality and T2 hyperintensity in the right temporal lobe. Additional punctate foci of mildly restricted diffusion are present in the right occipital lobe, left temporal lobe, left caudate head, posterior right insula, and posterior right frontal lobe. No intracranial hemorrhage, mass, hydrocephalus, midline shift, or extra axial fluid collection is identified. There is mild cerebral atrophy. Patchy T2 hyperintensities in the cerebral white matter bilaterally have mildly progressed from the prior MRI and are nonspecific but compatible with mild chronic small vessel ischemic disease. A focus of susceptibility in the right sylvian fissure corresponds to likely vascular calcification on CT. Major intracranial vascular flow voids are  preserved. ORBITS: Bilateral cataract extraction. SINUSES AND MASTOIDS: No acute abnormality. BONES AND SOFT TISSUES: Normal marrow signal. No acute soft tissue abnormality. IMPRESSION: 1. Scattered small foci of diffusion abnormality in both cerebral hemispheres suspicious for acute to subacute embolic infarcts. 2. Mild chronic small vessel ischemic disease. Electronically signed by: Dasie Hamburg MD 07/16/2024 09:15 AM EDT RP Workstation: [id number]      PROCEDURES:  Critical Care performed: Yes, see critical care procedure note(s)  CRITICAL CARE Performed by: Oneil Budge   Total critical care time: 30 minutes  Critical care time was exclusive of separately billable procedures and treating other patients.  Critical care was necessary to treat or prevent imminent or life-threatening deterioration.  Critical care was time spent personally by me on the following activities: development of treatment plan with patient and/or surrogate as well as nursing, discussions with consultants, evaluation of patient's response to treatment, examination of patient, obtaining history from patient or surrogate, ordering and  performing treatments and interventions, ordering and review of laboratory studies, ordering and review of radiographic studies, pulse oximetry and re-evaluation of patient's condition.  Evidence of acute to subacute stroke with new neurologic deficit.  Neurology consultation, admission to hospital  Procedures   MEDICATIONS ORDERED IN ED: Medications  aspirin  chewable tablet 324 mg (has no administration in time range)     IMPRESSION / MDM / ASSESSMENT AND PLAN / ED COURSE  I reviewed the triage vital signs and the nursing notes.                              Differential diagnosis includes, but is not limited to, possible stroke or peripheral neuropathy seem high in the differential, otherwise associated hematoma compressing peripheral nerve after having difficulty with needle at hemodialysis yesterday, swelling at the fistula site etc. also considered.  Consultations to neurology and vascular surgery have been made  She has no acute infectious symptoms she is awake alert well-oriented.  Does have evidence of focal deficit presenting just outside of LVO treatment window but does not have exam findings suggestive of large vessel occlusion.  Patient's presentation is most consistent with acute presentation with potential threat to life or bodily function.  On review of labs patient has a persistent leukocytosis.  This does not appear to be acute.  No significant departure from previous baselines  Electrolytes appropriate creatinine 7.6 expected for dialysis patient.     The patient is on the cardiac monitor to evaluate for evidence of arrhythmia and/or significant heart rate changes.   Clinical Course as of 07/16/24 1046  Wed Jul 16, 2024  0808 Because of the history of how and timing of how this started as well as the proximity to the patient's fistula and recent hemodialysis, I have consulted both Dr. Matthews and Dr. Jama.  Both neurology and vascular teams will see and evaluate  [MQ]    Clinical Course User Index [MQ] Budge Oneil, MD   ----------------------------------------- 10:45 AM on 07/16/2024 ----------------------------------------- Updated patient, aware of stroke diagnosis.  Husband at the bedside.  Understanding agreeable to admission.  Patient initially does not wish to be admitted to the hospital, but after discussing with the patient shared medical decision making between her and her husband and she is agreeable.  Final neurology consult pending.  Aspirin  administered.  Blood pressure appropriate  Consulted with patient accepted to hospital service by Dr. Sherre  Patient currently pending  consult with Dr. Matthews as  well  FINAL CLINICAL IMPRESSION(S) / ED DIAGNOSES   Final diagnoses:  Cerebrovascular accident (CVA) due to embolism of cerebral artery (HCC)     Rx / DC Orders   ED Discharge Orders     None        Note:  This document was prepared using Dragon voice recognition software and may include unintentional dictation errors.   Dicky Anes, MD 07/16/24 1115

## 2024-07-16 NOTE — Plan of Care (Signed)
  Problem: Education: Goal: Knowledge of disease or condition will improve Outcome: Progressing   Problem: Ischemic Stroke/TIA Tissue Perfusion: Goal: Complications of ischemic stroke/TIA will be minimized Outcome: Progressing   Problem: Coping: Goal: Will verbalize positive feelings about self Outcome: Progressing   Problem: Self-Care: Goal: Ability to participate in self-care as condition permits will improve Outcome: Progressing   Problem: Nutrition: Goal: Risk of aspiration will decrease Outcome: Progressing   Problem: Education: Goal: Knowledge of General Education information will improve Description: Including pain rating scale, medication(s)/side effects and non-pharmacologic comfort measures Outcome: Progressing   Problem: Health Behavior/Discharge Planning: Goal: Ability to manage health-related needs will improve Outcome: Progressing   Problem: Nutrition: Goal: Adequate nutrition will be maintained Outcome: Progressing   Problem: Coping: Goal: Level of anxiety will decrease Outcome: Progressing   Problem: Pain Managment: Goal: General experience of comfort will improve and/or be controlled Outcome: Progressing   Problem: Safety: Goal: Ability to remain free from injury will improve Outcome: Progressing   Problem: Skin Integrity: Goal: Risk for impaired skin integrity will decrease Outcome: Progressing

## 2024-07-16 NOTE — Assessment & Plan Note (Addendum)
 Scattered small foci of diffusion abnormality in both cerebral hemispheres suspicious for acute to subacute embolic infarcts Neurologist, Dr. Matthews has been consulted and we appreciate further recommendations Complete echo with bubble study Fasting lipid and A1c ordered Permissive hypertension Frequent neuro vascular checks N.p.o. pending swallow study PT, OT Fall precaution

## 2024-07-16 NOTE — Hospital Course (Signed)
 [name] is a 89 year old female with history of end-stage renal disease on hemodialysis Tuesday Thursday Saturday, hypertension, depression with anxiety, neuropathy, restless leg syndrome, hyperlipidemia, insomnia, who presents ED for chief concerns of left arm weakness.  Vitals in the ED showed t of 98.6, rr of 14, hr 65, blood pressure 155/60, SpO2 of 89% on room air.  Serum sodium is 143, potassium 4.9, chloride 98, bicarb 25, BUN of 51, serum creatinine 7.66, eGFR 5, nonfasting blood glucose 72, WBC 21.8, hemoglobin 14.2, platelets of 504.  ED treatment: Aspirin  324 mg p.o. one-time dose.

## 2024-07-16 NOTE — ED Notes (Signed)
 Patient transported to MRI

## 2024-07-17 ENCOUNTER — Observation Stay

## 2024-07-17 ENCOUNTER — Observation Stay
Admit: 2024-07-17 | Discharge: 2024-07-17 | Disposition: A | Discharge status: Home / Self Care | Attending: Internal Medicine | Admitting: Internal Medicine

## 2024-07-17 DIAGNOSIS — I63411 Cerebral infarction due to embolism of right middle cerebral artery: Secondary | ICD-10-CM | POA: Diagnosis not present

## 2024-07-17 DIAGNOSIS — I6340 Cerebral infarction due to embolism of unspecified cerebral artery: Secondary | ICD-10-CM | POA: Diagnosis not present

## 2024-07-17 LAB — RENAL FUNCTION PANEL
Albumin: 2.7 g/dL — ABNORMAL LOW (ref 3.5–5.0)
Anion gap: 13 (ref 5–15)
BUN: 67 mg/dL — ABNORMAL HIGH (ref 8–23)
CO2: 27 mmol/L (ref 22–32)
Calcium: 6.5 mg/dL — ABNORMAL LOW (ref 8.9–10.3)
Chloride: 99 mmol/L (ref 98–111)
Creatinine, Ser: 8.95 mg/dL — ABNORMAL HIGH (ref 0.44–1.00)
GFR, Estimated: 4 mL/min — ABNORMAL LOW (ref >60)
Glucose, Bld: 98 mg/dL (ref 70–99)
Phosphorus: 7 mg/dL — ABNORMAL HIGH (ref 2.5–4.6)
Potassium: 5.6 mmol/L — ABNORMAL HIGH (ref 3.5–5.1)
Sodium: 139 mmol/L (ref 135–145)

## 2024-07-17 LAB — CBC
HCT: 43.4 % (ref 36.0–46.0)
Hemoglobin: 13.7 g/dL (ref 12.0–15.0)
MCH: 31.8 pg (ref 26.0–34.0)
MCHC: 31.6 g/dL (ref 30.0–36.0)
MCV: 100.7 fL — ABNORMAL HIGH (ref 80.0–100.0)
Platelets: 540 K/uL — ABNORMAL HIGH (ref 150–400)
RBC: 4.31 MIL/uL (ref 3.87–5.11)
RDW: 16.9 % — ABNORMAL HIGH (ref 11.5–15.5)
WBC: 20.9 K/uL — ABNORMAL HIGH (ref 4.0–10.5)
nRBC: 0 % (ref 0.0–0.2)

## 2024-07-17 LAB — LIPID PANEL
Cholesterol: 212 mg/dL — ABNORMAL HIGH (ref 0–200)
HDL: 56 mg/dL (ref >40)
LDL Cholesterol: 144 mg/dL — ABNORMAL HIGH (ref 0–99)
Total CHOL/HDL Ratio: 3.8 ratio
Triglycerides: 61 mg/dL (ref <150)
VLDL: 12 mg/dL (ref 0–40)

## 2024-07-17 LAB — HEMOGLOBIN A1C
Hgb A1c MFr Bld: 5.8 % — ABNORMAL HIGH (ref 4.8–5.6)
Mean Plasma Glucose: 119.76 mg/dL

## 2024-07-17 LAB — ECHOCARDIOGRAM COMPLETE BUBBLE STUDY
AR max vel: 1.63 cm2
AV Area VTI: 1.75 cm2
AV Area mean vel: 1.66 cm2
AV Mean grad: 7 mmHg
AV Peak grad: 13.4 mmHg
Ao pk vel: 1.83 m/s
Area-P 1/2: 2.76 cm2
S' Lateral: 2.8 cm

## 2024-07-17 LAB — HEPATITIS B SURFACE ANTIGEN: Hepatitis B Surface Ag: NONREACTIVE

## 2024-07-17 MED ORDER — CLONIDINE HCL 0.1 MG PO TABS
0.1000 mg | ORAL_TABLET | Freq: Two times a day (BID) | ORAL | Status: DC
  Administered 2024-07-17 – 2024-07-18 (×2): 0.1 mg via ORAL
  Filled 2024-07-17 (×2): qty 1

## 2024-07-17 MED ORDER — PENTAFLUOROPROP-TETRAFLUOROETH EX AERO
1.0000 | INHALATION_SPRAY | CUTANEOUS | Status: DC | PRN
  Administered 2024-07-17: 1 via TOPICAL
  Filled 2024-07-17: qty 30

## 2024-07-17 MED ORDER — LIDOCAINE-PRILOCAINE 2.5-2.5 % EX CREA
1.0000 | TOPICAL_CREAM | CUTANEOUS | Status: DC | PRN
Start: 2024-07-17 — End: 2024-07-17

## 2024-07-17 MED ORDER — PENTAFLUOROPROP-TETRAFLUOROETH EX AERO
INHALATION_SPRAY | CUTANEOUS | Status: AC
  Filled 2024-07-17: qty 30

## 2024-07-17 MED ORDER — HEPARIN SODIUM (PORCINE) 5000 UNIT/ML IJ SOLN
5000.0000 [IU] | Freq: Three times a day (TID) | INTRAMUSCULAR | Status: DC
  Administered 2024-07-17 – 2024-07-18 (×3): 5000 [IU] via SUBCUTANEOUS
  Filled 2024-07-17 (×3): qty 1

## 2024-07-17 MED ORDER — HYDRALAZINE HCL 20 MG/ML IJ SOLN
10.0000 mg | Freq: Four times a day (QID) | INTRAMUSCULAR | Status: DC | PRN
  Administered 2024-07-17: 10 mg via INTRAVENOUS
  Filled 2024-07-17: qty 1

## 2024-07-17 MED ORDER — AMLODIPINE BESYLATE 10 MG PO TABS
10.0000 mg | ORAL_TABLET | Freq: Every day | ORAL | Status: DC
  Administered 2024-07-17 – 2024-07-18 (×2): 10 mg via ORAL
  Filled 2024-07-17 (×2): qty 1

## 2024-07-17 MED ORDER — HEPARIN SODIUM (PORCINE) 1000 UNIT/ML DIALYSIS: 1000.0000 [IU] | INTRAMUSCULAR | Status: DC | PRN

## 2024-07-17 NOTE — Progress Notes (Signed)
 Pt receives outpt HD at [facility]. Navigator following to assist with any HD needs.  Suzen Satchel Dialysis Navigator [phone]

## 2024-07-17 NOTE — Progress Notes (Signed)
 OT Cancellation Note  Patient Details Name: Cassandra [name] MRN: [id number] DOB: 15-Oct-1926   Cancelled Treatment:    Reason Eval/Treat Not Completed: Patient at procedure or test/ unavailable patient was in HD this morning, OT attempted this afternoon and ECHO in room to start procedure. OT to follow up as able.  [name] Clause 07/17/2024, 1:53 PM

## 2024-07-17 NOTE — Progress Notes (Signed)
  Received patient in bed to unit.   Informed consent signed and in chart.    TX duration: 3:30     Transported by  Hand-off given to patient's nurse.    Access used: Left upper arm fistula Access issues: none, but machine trans membrane clotted and had to rinse pt back and set up new system.    Total UF removed: 1000 L Medication(s) given: none Post HD VS: wnl Post HD weight: 55.1 kg     N. Michaila Kenney LPN [facility]

## 2024-07-17 NOTE — Progress Notes (Signed)
 PT Cancellation Note  Patient Details Name: Cassandra [name] MRN: [id number] DOB: 03-22-1927   Cancelled Treatment:    Reason Eval/Treat Not Completed: Patient again off of the floor at second procedure or test/unavailable, will attempt to see pt at a future date/time as medically appropriate.     CHARM [name] PT, DPT 07/17/24, 1:55 PM

## 2024-07-17 NOTE — Care Management Obs Status (Signed)
 MEDICARE OBSERVATION STATUS NOTIFICATION   Patient Details  Name: Cassandra [name] MRN: [id number] Date of Birth: 1927/02/10   Medicare Observation Status Notification Given:  Chaney BRANDY CHRISTIANE LELON, CMA 07/17/2024, 12:10 PM

## 2024-07-17 NOTE — Progress Notes (Signed)
  PROGRESS NOTE    Cassandra [name]  FMW:[id number] DOB: Sep 02, 1927 DOA: 07/16/2024 PCP: Cleotilde Oneil FALCON, MD  126A/126A-AA  LOS: 0 days   Brief hospital course:   Assessment & Plan: Cassandra [name] is a 89 year old female with history of end-stage renal disease on hemodialysis Tuesday Thursday Saturday, hypertension, depression with anxiety, neuropathy, restless leg syndrome, hyperlipidemia, insomnia, who presents ED for chief concerns of left arm weakness.    * Acute Stroke (HCC) Scattered small foci of diffusion abnormality in both cerebral hemispheres suspicious for acute to subacute embolic infarcts. Neurologist, Dr. Matthews has been consulted  --Echo showed normal left atrial size and no interatrial shunt. --no hx of afib  --will need cardiac monitor at discharge. --cont Asa and plavix --cont statin  Chronic hypoxemic respiratory failure On 2L O2 PRN --Continue supplemental O2 to keep sats >=90%, wean as tolerated  HTN --resume home clonidine  --resume home amlodipine  at 10 mg daily --IV hydralazine  PRN  Restless legs syndrome --cont Home ropinirole  0.5 mg p.o. twice daily   ESRD on dialysis (HCC) --iHD per nephro  Hyperlipidemia, mixed --cont home statin  CAD (coronary artery disease), native coronary artery --cont asa and statin  Acquired hypothyroidism --cont home Synthroid    DVT prophylaxis: Heparin  SQ Code Status: Full code  Family Communication: husband updated at bedside today Level of care: Telemetry Medical Dispo:   The patient is from: home Anticipated d/c is to: home Anticipated d/c date is: tomorrow   Subjective and Interval History:  Pt reported her right arm was back to normal and was eager to go home.   Objective: Vitals:   07/17/24 1234 07/17/24 1256 07/17/24 1525 07/17/24 1750  BP: (!) 170/63  (!) 196/67 (!) 126/52  Pulse: 65  69 67  Resp: 12     Temp: 97.7 F (36.5 C)  98.3 F (36.8 C)   TempSrc: Oral  Oral   SpO2: 94%  95%    Weight:  55.1 kg    Height:        Intake/Output Summary (Last 24 hours) at 07/17/2024 1819 Last data filed at 07/17/2024 1744 Gross per 24 hour  Intake 610 ml  Output 1000 ml  Net -390 ml   Filed Weights   07/16/24 0627 07/17/24 0755 07/17/24 1256  Weight: 57.6 kg 55.7 kg 55.1 kg    Examination:   Constitutional: NAD, AAOx3 HEENT: conjunctivae and lids normal, EOMI CV: No cyanosis.   RESP: normal respiratory effort, on 2L Neuro: II - XII grossly intact.   Psych: Normal mood and affect.  Appropriate judgement and reason   Data Reviewed: I have personally reviewed labs and imaging studies  Time spent: 50 minutes  Ellouise Haber, MD [facility] If 7PM-7AM, please contact night-coverage 07/17/2024, 6:19 PM

## 2024-07-17 NOTE — Progress Notes (Signed)
 Pt is pleasant, alert and oriented x 4, on 2 LPM of NCL, normal respiratory effort, stable hemodynamically, afebrile, no distress. Her spouse and her son visit at bedside.  Neuro check q 4 hrs. Initial neuro assessment of night shift as documented below. Plan of care is reviewed.  We will continue to monitor.   07/16/24 1951  Neurological  Neuro (WDL) X  Orientation Level Oriented X4  Cognition Appropriate at baseline;Appropriate judgement;Appropriate attention/concentration;Appropriate safety awareness;Appropriate for developmental age;Follows commands;No memory impairment  Speech Clear;Appropriate for developmental age;Appropriate at baseline  R Pupil Size (mm) 3  R Pupil Shape Round  R Pupil Reaction Brisk  L Pupil Size (mm) 3  L Pupil Shape Round  L Pupil Reaction Brisk  Motor Function/Sensation Assessment Grip;Head;Elbow extension;Dorsiflexion;Elbow flexion;Pronator drift;Plantar flexion;Arm ataxia;Leg ataxia;Motor response;Sensation;Motor strength  Facial Symmetry Symmetrical  R Hand Grip Strong  L Hand Grip Moderate  R Elbow Extension (Push/Biceps) Strong  L Elbow Extension (Push/Biceps) Moderate  R Elbow Flexion (Pull/Triceps) Strong  L Elbow Flexion (Pull/Triceps) Moderate  Right Pronator Drift Absent  Left Pronator Drift Present  R Foot Dorsiflexion Strong  L Foot Dorsiflexion Strong  R Foot Plantar Flexion Strong  L Foot Plantar Flexion Strong  R Finger to Nose (Point to [employer]) Smooth  L Finger to Nose (Point to [employer]) Smooth  R Heel to [employer] (Point to [employer]) Smooth  L Heel to [employer] to [employer]) [employer] Response Purposeful movement  RUE Sensation Full sensation  RUE Motor Strength 5  LUE Motor Response Purposeful movement  LUE Sensation Full sensation  LUE Motor Strength 4  RLE Motor Response Purposeful movement  RLE Sensation Full sensation  RLE Motor Strength 5  LLE Motor Response Purposeful movement  LLE Sensation Full sensation  LLE Motor  Strength 5  Neuro Symptoms Fatigue  Neuro symptoms relieved by Relaxation techniques (Comment);Rest  Glasgow Coma Scale  Eye Opening 4  Best Verbal Response (NON-intubated) 5  Best Motor Response 6  Glasgow Coma Scale Score 15  NIH Stroke Scale   Dizziness Present No  Headache Present No  Interval Shift assessment  Level of Consciousness (1a.)    0  LOC Questions (1b. )    0  LOC Commands (1c. )    0  Best Gaze (2. )   0  Visual (3. )   0  Facial Palsy (4. )     0  Motor Arm, Left (5a. )    1  Motor Arm, Right (5b. )  0  Motor Leg, Left (6a. )   0  Motor Leg, Right (6b. )  0  Limb Ataxia (7. ) 0  Sensory (8. )   0  Best Language (9. )   0  Dysarthria (10. ) 0  Extinction/Inattention (11.)    0  Complete NIHSS TOTAL 1  Neurological  Level of Consciousness Alert    Wendi Dash, RN

## 2024-07-17 NOTE — Progress Notes (Signed)
 PT Cancellation Note  Patient Details Name: Cassandra [name] MRN: [id number] DOB: [birth year]   Cancelled Treatment:    Reason Eval/Treat Not Completed: Patient at procedure or test/unavailable, will attempt to see pt at a future date/time as medically appropriate.     CHARM [name] PT, DPT 07/17/24, 8:37 AM

## 2024-07-17 NOTE — Progress Notes (Signed)
 [facility]  ROUNDING NOTE   Subjective:   Cassandra [name] is a 89 year old female with past medical conditions including anxiety, hypertension, RLS, neuropathy, and end-stage renal disease on hemodialysis.  Patient presents to the emergency department with left-sided weakness and has been admitted for Stroke [facility]) [I63.9] Cerebrovascular accident (CVA) due to embolism of cerebral artery [facility]) [I63.40]  Patient is known to our practice and receives dialysis at [facility] on a TTS, supervised by Dr Marcelino. Last treatment received on Tuesday, partial treatment received. Patient seen and evaluated during dialysis.    HEMODIALYSIS FLOWSHEET:  Blood Flow Rate (mL/min): 0 mL/min Arterial Pressure (mmHg): -151.91 mmHg Venous Pressure (mmHg): 181.2 mmHg TMP (mmHg): -8.08 mmHg Ultrafiltration Rate (mL/min): 781 mL/min Dialysate Flow Rate (mL/min): 300 ml/min  Patient states she fell asleep during dialysis treatment and caused her needles to come out of her arm.  Does not recall coming to emergency department or how long she has been in the hospital.  Patient easily redirected today and time.  Labs on ED arrival unremarkable for renal patient, indicative of recent dialysis treatments.  Brain MRI suspicious for acute to subacute embolic infarcts with mild chronic small vessel ischemic disease.  We have been consulted to manage dialysis needs.   Objective:  Vital signs in last 24 hours:  Temp:  [97.7 F (36.5 C)-98.6 F (37 C)] 97.7 F (36.5 C) (10/23 1234) Pulse Rate:  [58-74] 65 (10/23 1234) Resp:  [12-22] 12 (10/23 1234) BP: (116-170)/(47-96) 170/63 (10/23 1234) SpO2:  [90 %-96 %] 94 % (10/23 1234) Weight:  [55.1 kg-55.7 kg] 55.1 kg (10/23 1256)  Weight change:  Filed Weights   07/16/24 0627 07/17/24 0755 07/17/24 1256  Weight: 57.6 kg 55.7 kg 55.1 kg    Intake/Output: I/O last 3 completed shifts: In: 970 [P.O.:970] Out: -    Intake/Output this shift:   Total I/O In: -  Out: 1000 [Other:1000]  Physical Exam: General: NAD  Head: Normocephalic, atraumatic. Moist oral mucosal membranes  Eyes: Anicteric  Lungs:  Clear to auscultation, normal effort  Heart: Regular rate and rhythm  Abdomen:  Soft, nontender  Extremities: Trace peripheral edema.  Neurologic: Awake, alert, conversant  Skin: Warm,dry, no rash  Access: Left upper aVF    Basic Metabolic Panel: Recent Labs  Lab 07/16/24 0744 07/17/24 0838  NA 143 139  K 4.9 5.6*  CL 98 99  CO2 25 27  GLUCOSE 72 98  BUN 51* 67*  CREATININE 7.66* 8.95*  CALCIUM  7.2* 6.5*  PHOS  --  7.0*    Liver Function Tests: Recent Labs  Lab 07/17/24 0838  ALBUMIN  2.7*   No results for input(s): LIPASE, AMYLASE in the last 168 hours. No results for input(s): AMMONIA in the last 168 hours.  CBC: Recent Labs  Lab 07/16/24 0744 07/17/24 0838  WBC 21.8* 20.9*  HGB 14.2 13.7  HCT 45.9 43.4  MCV 100.4* 100.7*  PLT 504* 540*    Cardiac Enzymes: No results for input(s): CKTOTAL, CKMB, CKMBINDEX, TROPONINI in the last 168 hours.  BNP: Invalid input(s): POCBNP  CBG: No results for input(s): GLUCAP in the last 168 hours.  Microbiology: Results for orders placed or performed during the hospital encounter of 08/24/23  Blood culture (routine x 2)     Status: None   Collection Time: 08/24/23  5:21 PM   Specimen: BLOOD  Result Value Ref Range Status   Specimen Description BLOOD BLOOD RIGHT FOREARM  Final   Special Requests  Final    BOTTLES DRAWN AEROBIC AND ANAEROBIC Blood Culture adequate volume   Culture   Final    NO GROWTH 5 DAYS Performed at [facility], [address] [place], [place] [address]    Report Status 08/29/2023 FINAL  Final  Blood culture (routine x 2)     Status: None   Collection Time: 08/24/23  8:36 PM   Specimen: BLOOD RIGHT ARM  Result Value Ref Range Status   Specimen Description BLOOD RIGHT ARM  Final   Special Requests    Final    BOTTLES DRAWN AEROBIC AND ANAEROBIC Blood Culture results may not be optimal due to an excessive volume of blood received in culture bottles   Culture   Final    NO GROWTH 5 DAYS Performed at [facility], [address] [place], [place] [address]    Report Status 08/29/2023 FINAL  Final  MRSA Next Gen by PCR, Nasal     Status: None   Collection Time: 08/25/23  5:38 PM   Specimen: Nasal Mucosa; Nasal Swab  Result Value Ref Range Status   MRSA by PCR Next Gen NOT DETECTED NOT DETECTED Final    Comment: (NOTE) The GeneXpert MRSA Assay (FDA approved for NASAL specimens only), is one component of a comprehensive MRSA colonization surveillance program. It is not intended to diagnose MRSA infection nor to guide or monitor treatment for MRSA infections. Test performance is not FDA approved in patients less than 74 years old. Performed at [facility], [address] [place], [place] [address]     Coagulation Studies: No results for input(s): LABPROT, INR in the last 72 hours.  Urinalysis: No results for input(s): COLORURINE, LABSPEC, PHURINE, GLUCOSEU, HGBUR, BILIRUBINUR, KETONESUR, PROTEINUR, UROBILINOGEN, NITRITE, LEUKOCYTESUR in the last 72 hours.  Invalid input(s): APPERANCEUR    Imaging: MR ANGIO HEAD WO CONTRAST Result Date: 07/16/2024 EXAM: MR Angiography Head without intravenous Contrast. 07/16/2024 06:29:44 PM TECHNIQUE: Magnetic resonance angiography images of the head without intravenous contrast. Multiplanar 2D and 3D reformatted images are provided for review. COMPARISON: None provided. CLINICAL HISTORY: Neuro deficit, acute, stroke suspected; acute/subacute embolic infarcts on MRI. Neurology consulted for acute ischemic stroke, favored to be cardioembolic based on involvement of multiple vascular distributions. Patient will be seen in formal consultation tomorrow. In the meantime I have reviewed her chart and added  on orders for MRA head wo, carotid US , and plavix 75mg  daily. FINDINGS: ANTERIOR CIRCULATION: No significant stenosis of the internal carotid arteries. No significant stenosis of the anterior cerebral arteries. No significant stenosis of the middle cerebral arteries. No aneurysm. POSTERIOR CIRCULATION: Limited visualization of the right vertebral artery V4 segment, which appears to be occluded. The left vertebral artery V4 segment is normal. No significant stenosis of the posterior cerebral arteries. No significant stenosis of the basilar artery. No aneurysm. IMPRESSION: 1. 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 Electronically signed by: Franky Stanford MD 07/16/2024 07:40 PM EDT RP Workstation: [id number]   MR Cervical Spine Wo Contrast Result Date: 07/16/2024 EXAM: MRI CERVICAL SPINE WITHOUT CONTRAST 07/16/2024 08:38:03 AM TECHNIQUE: Multiplanar multisequence MRI of the cervical spine was performed. COMPARISON: Cervical spine CT 02/13/2022 and MRI 11/28/2021. CLINICAL HISTORY: Cervical radiculopathy, left arm weakness, numbness/tingling, history of dialysis. FINDINGS: LIMITATIONS: The examination is motion degraded, severely so on axial images. The patient would not tolerate any attempts at repeat imaging. BONES AND ALIGNMENT: Chronic borderline widening of the atlantodental interval measuring 3 mm,  unchanged from the prior MRI. Normal alignment elsewhere. No acute fracture or suspicious marrow lesion in the cervical spine. T5 compression fracture with severe anterior vertebral body height loss and mild to moderate marrow edema, new from a 03/05/2023 chest CT. SPINAL CORD: Grossly normal cervical spinal cord signal within limitations of motion artifact. SOFT TISSUES: No paraspinal mass. DISC LEVELS: Mild cervical spondylosis is similar to the prior MRI with preserved disc space heights. A chronic left paracentral disc protrusion at C2-C3 results in slight  left ventral cord flattening. No significant cervical spinal canal stenosis is present. Mild to moderate multilevel neural foraminal stenosis is grossly similar to the prior MRI with assessment limited by motion. IMPRESSION: 1. Motion degraded examination without an acute finding in the cervical spine. 2. T5 compression fracture with severe anterior height loss and mild to moderate marrow edema, new from 2024 and potentially subacute. 3. Mild cervical spondylosis without significant spinal stenosis. Electronically signed by: Dasie Hamburg MD 07/16/2024 09:29 AM EDT RP Workstation: [id number]   MR BRAIN WO CONTRAST Result Date: 07/16/2024 EXAM: MRI BRAIN WITHOUT CONTRAST 07/16/2024 08:38:03 AM TECHNIQUE: Multiplanar multisequence MRI of the head/brain was performed without the administration of intravenous contrast. COMPARISON: Head CT 05/02/2022 and MRI 10/12/2021. CLINICAL HISTORY: Neuro deficit, acute, stroke suspected; L arm weakness started yesterday. Patient receives dialysis. FINDINGS: The examination is mildly to moderately motion degraded. BRAIN AND VENTRICLES: There is a 1.6 cm focus of cortically based restricted diffusion and T2 hyperintensity in the posteroinferior right occipital lobe, and there is a 1 cm focus of milder diffusion weighted signal abnormality and T2 hyperintensity in the right temporal lobe. Additional punctate foci of mildly restricted diffusion are present in the right occipital lobe, left temporal lobe, left caudate head, posterior right insula, and posterior right frontal lobe. No intracranial hemorrhage, mass, hydrocephalus, midline shift, or extra axial fluid collection is identified. There is mild cerebral atrophy. Patchy T2 hyperintensities in the cerebral white matter bilaterally have mildly progressed from the prior MRI and are nonspecific but compatible with mild chronic small vessel ischemic disease. A focus of susceptibility in the right sylvian fissure corresponds to  likely vascular calcification on CT. Major intracranial vascular flow voids are preserved. ORBITS: Bilateral cataract extraction. SINUSES AND MASTOIDS: No acute abnormality. BONES AND SOFT TISSUES: Normal marrow signal. No acute soft tissue abnormality. IMPRESSION: 1. Scattered small foci of diffusion abnormality in both cerebral hemispheres suspicious for acute to subacute embolic infarcts. 2. Mild chronic small vessel ischemic disease. Electronically signed by: Dasie Hamburg MD 07/16/2024 09:15 AM EDT RP Workstation: [id number]     Medications:     [START ON 07/18/2024] amLODipine   5 mg Oral Daily   aspirin  EC  81 mg Oral Daily   calcium  acetate  1,334 mg Oral TID with meals   Chlorhexidine  Gluconate Cloth  6 each Topical Q0600   cloNIDine   0.1 mg Oral BID   clopidogrel  75 mg Oral Daily   gabapentin   100 mg Oral BID   gabapentin   200 mg Oral QHS   latanoprost   1 drop Both Eyes QHS   levothyroxine   88 mcg Oral Q0600   rOPINIRole   0.5 mg Oral BID   rosuvastatin   10 mg Oral QHS   sertraline  50 mg Oral Daily   timolol   1 drop Both Eyes q morning   traZODone   50 mg Oral QHS   acetaminophen  **OR** acetaminophen  (TYLENOL ) oral liquid 160 mg/5 mL **OR** acetaminophen , ALPRAZolam , hydrALAZINE , HYDROcodone -acetaminophen , senna-docusate  Assessment/ Plan:  [name] is a 89 y.o.  female with past medical conditions including anxiety, hypertension, RLS, neuropathy, and end-stage renal disease on hemodialysis.  Patient presents to the emergency department with left-sided weakness and has been admitted for Stroke [facility]) [I63.9] Cerebrovascular accident (CVA) due to embolism of cerebral artery (HCC) [I63.40]  [facility]/TTS/left aVF   Stroke, brain MRI suspicious for acute to subacute embolic infarcts.  Neurology is consulted.  Workup remains in progress.  Echo pending.  2.  End-stage renal disease on hemodialysis.  Patient received partial treatment on Tuesday.  Receiving  full treatment today, UF goal 1 L as tolerated.  Next treatment scheduled for Saturday.  3. Anemia of chronic kidney disease Lab Results  Component Value Date   HGB 13.7 07/17/2024    Hemoglobin within desired range.  No need for ESA at this time.  4. Secondary Hyperparathyroidism: with outpatient labs: None available Lab Results  Component Value Date   CALCIUM  6.5 (L) 07/17/2024   CAION 1.11 (L) 05/25/2023   PHOS 7.0 (H) 07/17/2024    Will continue to monitor bone minerals during this admission.  Corrected calcium  7.5 with elevated phosphorus.  Continue calcium  acetate with meals.   LOS: 0 Cassandra [name] 10/23/20251:30 PM

## 2024-07-18 DIAGNOSIS — I63411 Cerebral infarction due to embolism of right middle cerebral artery: Secondary | ICD-10-CM | POA: Diagnosis not present

## 2024-07-18 DIAGNOSIS — I6340 Cerebral infarction due to embolism of unspecified cerebral artery: Secondary | ICD-10-CM | POA: Diagnosis not present

## 2024-07-18 LAB — CBC
HCT: 43.6 % (ref 36.0–46.0)
Hemoglobin: 13.9 g/dL (ref 12.0–15.0)
MCH: 32 pg (ref 26.0–34.0)
MCHC: 31.9 g/dL (ref 30.0–36.0)
MCV: 100.5 fL — ABNORMAL HIGH (ref 80.0–100.0)
Platelets: 566 K/uL — ABNORMAL HIGH (ref 150–400)
RBC: 4.34 MIL/uL (ref 3.87–5.11)
RDW: 16.8 % — ABNORMAL HIGH (ref 11.5–15.5)
WBC: 19.1 K/uL — ABNORMAL HIGH (ref 4.0–10.5)
nRBC: 0 % (ref 0.0–0.2)

## 2024-07-18 LAB — BASIC METABOLIC PANEL WITH GFR
Anion gap: 10 (ref 5–15)
BUN: 26 mg/dL — ABNORMAL HIGH (ref 8–23)
CO2: 27 mmol/L (ref 22–32)
Calcium: 7.7 mg/dL — ABNORMAL LOW (ref 8.9–10.3)
Chloride: 95 mmol/L — ABNORMAL LOW (ref 98–111)
Creatinine, Ser: 5.01 mg/dL — ABNORMAL HIGH (ref 0.44–1.00)
GFR, Estimated: 8 mL/min — ABNORMAL LOW (ref >60)
Glucose, Bld: 87 mg/dL (ref 70–99)
Potassium: 4.7 mmol/L (ref 3.5–5.1)
Sodium: 132 mmol/L — ABNORMAL LOW (ref 135–145)

## 2024-07-18 LAB — MAGNESIUM: Magnesium: 2.2 mg/dL (ref 1.7–2.4)

## 2024-07-18 MED ORDER — ASPIRIN 81 MG PO TBEC: 81.0000 mg | DELAYED_RELEASE_TABLET | Freq: Every day | ORAL | Status: AC

## 2024-07-18 MED ORDER — CLOPIDOGREL BISULFATE 75 MG PO TABS: 75.0000 mg | ORAL_TABLET | Freq: Every day | ORAL | 2 refills | Status: DC

## 2024-07-18 MED ORDER — ROSUVASTATIN CALCIUM 10 MG PO TABS: 10.0000 mg | ORAL_TABLET | Freq: Every day | ORAL | Status: DC

## 2024-07-18 NOTE — Discharge Summary (Signed)
 Physician Discharge Summary   Cassandra [name]  female DOB: Jun 05, 1927  FMW:[id number]  PCP: Cleotilde Oneil FALCON, MD  Admit date: 07/16/2024 Discharge date: 07/18/2024  Admitted From: home Disposition:  home Home Health: Yes CODE STATUS: Full code  Discharge Instructions     Diet renal with fluid restriction   Complete by: As directed    Discharge instructions   Complete by: As directed    For your stroke, please take aspirin  81 mg and plavix 75 mg together for 21 days, and after that, just plavix alone.  Because of your kidney function, your Crestor  is reduced from 20 mg to 10 mg daily.  You are discharged with cardiac monitor.  Please follow up with cardiology. [facility] Course:  For full details, please see H&P, progress notes, consult notes and ancillary notes.  Briefly,  Cassandra [name] is a 89 year old female with history of end-stage renal disease on hemodialysis Tuesday Thursday Saturday, hypertension, neuropathy, hyperlipidemia, who presented ED for chief concerns of left arm weakness.    * Acute Stroke (HCC) Scattered small foci of diffusion abnormality in both cerebral hemispheres suspicious for acute to subacute embolic infarcts. Neurologist, Dr. Matthews consulted  --Echo showed normal left atrial size and no interatrial shunt. --no hx of afib  --discharged on 21 day DAPT f/b plavix 75mg  daily monotherapy  --discharged on cardiac monitor --home Crestor  reduced from 20 mg to 10 mg, due to ESRD.    Chronic hypoxemic respiratory failure On 2L O2 PRN --Continue supplemental O2 to keep sats >=90%   HTN --cont home clonidine  and amlodipine    Restless legs syndrome --cont Home ropinirole  0.5 mg p.o. twice daily    ESRD on dialysis (HCC) --iHD per nephro   Hyperlipidemia, mixed --home Crestor  reduced from 20 mg to 10 mg, due to ESRD.    CAD (coronary artery disease), native coronary artery --home Crestor  reduced from 20 mg to 10 mg, due to ESRD.   --anti-plt therapy per neuro rec above.   Acquired hypothyroidism --cont home Synthroid    Unless noted above, medications under STOP list are ones pt was not taking PTA.  Discharge Diagnoses:  Principal Problem:   Stroke [facility]) Active Problems:   Restless legs syndrome   Leukocytosis   Hypertension   Chronic constipation   Acquired hypothyroidism   CAD (coronary artery disease), native coronary artery   Polycythemia vera (HCC)   Hyperlipidemia, mixed   ESRD on dialysis [facility])     Discharge Instructions:  Allergies as of 07/18/2024       Reactions   Atorvastatin Other (See Comments)   MYALGIA   Cyclobenzaprine Other (See Comments)   hallucination   Mirtazapine    Other reaction(s): Hallucination   Oxycodone-acetaminophen  Other (See Comments)   hallucination   Paroxetine Hcl Other (See Comments)   hallucination   Penicillins Swelling   Lip and orbital swelling Tolerated 3rd generation cephalosporin (CEFTRIAXONE ) between 03/05/2023 and 03/09/2023 with no documented ADRs.   Propoxyphene Other (See Comments)   hallucination   Trazodone  Other (See Comments)   hallucination        Medication List     STOP taking these medications    FLUoxetine 20 MG capsule Commonly known as: PROZAC       TAKE these medications    ALPRAZolam  0.25 MG tablet Commonly known as: XANAX  Take 1 tablet (0.25 mg total) by mouth daily as needed.   amLODipine  5 MG tablet Commonly known as: NORVASC   Take 5 mg by mouth daily.   aspirin  EC 81 MG tablet Take 1 tablet (81 mg total) by mouth daily for 21 days. Swallow whole.   betamethasone valerate 0.1 % cream Commonly known as: VALISONE Apply 1 Application topically daily.   calcium  acetate 667 MG capsule Commonly known as: PHOSLO  Take 1,334 mg by mouth 3 (three) times daily.   cetirizine 10 MG tablet Commonly known as: ZYRTEC Take 1 tablet by mouth daily as needed for allergies.   cloNIDine  0.1 MG tablet Commonly  known as: CATAPRES  Take 0.1 mg by mouth 2 (two) times daily.   clopidogrel 75 MG tablet Commonly known as: PLAVIX Take 1 tablet (75 mg total) by mouth daily. Start taking on: July 19, 2024   [employer] D3 25 MCG (1000 UT) capsule Generic drug: Cholecalciferol  Take 1,000 Units by mouth daily.   Droxia  300 MG capsule Generic drug: hydroxyurea  TAKE 1 CAPSULE (300 MG TOTAL) BY MOUTH DAILY. MAY TAKE WITH FOOD TO MINIMIZE GI SIDE EFFECTS.   feeding supplement (NEPRO CARB STEADY) Liqd Take 237 mLs by mouth 2 (two) times daily between meals.   gabapentin  100 MG capsule Commonly known as: NEURONTIN  Take 1-2 capsules (100-200 mg total) by mouth 3 (three) times daily. 100 mg am 100 mg afternoon 200 mg bedtime. Home med.   glucose blood test strip Use once daily. Use as instructed.   HYDROcodone -acetaminophen  5-325 MG tablet Commonly known as: NORCO/VICODIN Take 1 tablet by mouth every 6 (six) hours as needed for moderate pain (pain score 4-6) or severe pain (pain score 7-10).   hydrOXYzine 10 MG tablet Commonly known as: ATARAX Take 10 mg by mouth daily as needed.   latanoprost  0.005 % ophthalmic solution Commonly known as: XALATAN  Place 1 drop into both eyes at bedtime.   levothyroxine  88 MCG tablet Commonly known as: SYNTHROID  Take 88 mcg by mouth daily. Take on an empty stomach 30 to 60 minutes before breakfast   lidocaine -prilocaine  cream Commonly known as: EMLA  Apply 1 Application topically as needed.   naloxone 4 MG/0.1ML Liqd nasal spray kit Commonly known as: NARCAN Place 4 mg into the nose once.   rOPINIRole  0.5 MG tablet Commonly known as: REQUIP  Take 1 tablet (0.5 mg total) by mouth in the morning and at bedtime.   rosuvastatin  10 MG tablet Commonly known as: CRESTOR  Take 1 tablet (10 mg total) by mouth daily. Reduced from 20 mg to 10 mg, due to ESRD. What changed:  medication strength how much to take additional instructions   sertraline 50 MG  tablet Commonly known as: ZOLOFT Take 50 mg by mouth daily.   timolol  0.5 % ophthalmic gel-forming Commonly known as: TIMOPTIC -XR Place 1 drop into both eyes every morning.   traZODone  50 MG tablet Commonly known as: DESYREL  Take 1 tablet (50 mg total) by mouth at bedtime.         Follow-up Information     Cleotilde Oneil FALCON, MD Follow up in 1 week(s).   Specialty: Internal Medicine Contact information: [phone] [facility] [address] [facility] [place] [address] [phone]         Wilburn Keller BROCKS, MD Follow up.   Specialty: Cardiology Why: cardiac monitor Contact information: [address] [place] [address] [phone]                 Allergies  Allergen Reactions   Atorvastatin Other (See Comments)    MYALGIA   Cyclobenzaprine Other (See Comments)  hallucination   Mirtazapine     Other reaction(s): Hallucination   Oxycodone-Acetaminophen  Other (See Comments)    hallucination   Paroxetine Hcl Other (See Comments)    hallucination   Penicillins Swelling    Lip and orbital swelling Tolerated 3rd generation cephalosporin (CEFTRIAXONE ) between 03/05/2023 and 03/09/2023 with no documented ADRs.   Propoxyphene Other (See Comments)    hallucination   Trazodone  Other (See Comments)    hallucination     The results of significant diagnostics from this hospitalization (including imaging, microbiology, ancillary and laboratory) are listed below for reference.   Consultations:   Procedures/Studies: ECHOCARDIOGRAM COMPLETE BUBBLE STUDY Result Date: 07/17/2024    ECHOCARDIOGRAM REPORT   Patient Name:   Cassandra [name] Auld Date of Exam: 07/17/2024 Medical Rec #:  [id number]         Height:       61.0 in Accession #:    [id number]        Weight:       121.5 lb Date of Birth:  06-18-1927        BSA:          1.528 m Patient Age:    89 years          BP:           170/63 mmHg Patient Gender: F                 HR:           66  bpm. Exam Location:  [facility] Procedure: 2D Echo, Cardiac Doppler, Color Doppler and Saline Contrast Bubble            Study (Both Spectral and Color Flow Doppler were utilized during            procedure). Indications:     Stroke  History:         Patient has prior history of Echocardiogram examinations, most                  recent 03/06/2023. CAD; Risk Factors:Hypertension, Diabetes and                  Dyslipidemia. CKD, ESRD.  Sonographer:     Philomena Daring Referring Phys:  [id number] AMY N COX Diagnosing Phys: Keller Alluri IMPRESSIONS  1. Left ventricular ejection fraction, by estimation, is 60 to 65%. The left ventricle has normal function. The left ventricle has no regional wall motion abnormalities. There is mild left ventricular hypertrophy. Left ventricular diastolic parameters are consistent with Grade I diastolic dysfunction (impaired relaxation).  2. Right ventricular systolic function is normal. The right ventricular size is normal.  3. The mitral valve is normal in structure. No evidence of mitral valve regurgitation.  4. The aortic valve is tricuspid. Aortic valve regurgitation is mild. Aortic valve sclerosis is present, with no evidence of aortic valve stenosis.  5. Agitated saline contrast bubble study was negative, with no evidence of any interatrial shunt. FINDINGS  Left Ventricle: Left ventricular ejection fraction, by estimation, is 60 to 65%. The left ventricle has normal function. The left ventricle has no regional wall motion abnormalities. The left ventricular internal cavity size was normal in size. There is  mild left ventricular hypertrophy. Left ventricular diastolic parameters are consistent with Grade I diastolic dysfunction (impaired relaxation). Right Ventricle: The right ventricular size is normal. No increase in right ventricular wall thickness. Right ventricular systolic function is normal. Left Atrium: Left atrial size was normal in size.  Right Atrium: Right atrial size was normal in  size. Pericardium: There is no evidence of pericardial effusion. Mitral Valve: The mitral valve is normal in structure. No evidence of mitral valve regurgitation. Tricuspid Valve: The tricuspid valve is normal in structure. Tricuspid valve regurgitation is trivial. Aortic Valve: The aortic valve is tricuspid. Aortic valve regurgitation is mild. Aortic valve sclerosis is present, with no evidence of aortic valve stenosis. Aortic valve mean gradient measures 7.0 mmHg. Aortic valve peak gradient measures 13.4 mmHg. Aortic valve area, by VTI measures 1.75 cm. Pulmonic Valve: The pulmonic valve was not well visualized. Pulmonic valve regurgitation is trivial. Aorta: The aortic root and ascending aorta are structurally normal, with no evidence of dilitation. IAS/Shunts: The atrial septum is grossly normal. Agitated saline contrast was given intravenously to evaluate for intracardiac shunting. Agitated saline contrast bubble study was negative, with no evidence of any interatrial shunt.  LEFT VENTRICLE PLAX 2D LVIDd:         4.30 cm   Diastology LVIDs:         2.80 cm   LV e' medial:    5.44 cm/s LV PW:         1.00 cm   LV E/e' medial:  12.1 LV IVS:        1.10 cm   LV e' lateral:   7.07 cm/s LVOT diam:     1.80 cm   LV E/e' lateral: 9.3 LV SV:         71 LV SV Index:   46 LVOT Area:     2.54 cm  RIGHT VENTRICLE             IVC RV S prime:     12.50 cm/s  IVC diam: 1.70 cm TAPSE (M-mode): 2.4 cm LEFT ATRIUM             Index        RIGHT ATRIUM           Index LA diam:        2.50 cm 1.64 cm/m   RA Area:     10.60 cm LA Vol (A2C):   39.9 ml 26.11 ml/m  RA Volume:   17.20 ml  11.26 ml/m LA Vol (A4C):   22.7 ml 14.86 ml/m LA Biplane Vol: 30.7 ml 20.09 ml/m  AORTIC VALVE AV Area (Vmax):    1.63 cm AV Area (Vmean):   1.66 cm AV Area (VTI):     1.75 cm AV Vmax:           183.00 cm/s AV Vmean:          122.000 cm/s AV VTI:            0.405 m AV Peak Grad:      13.4 mmHg AV Mean Grad:      7.0 mmHg LVOT Vmax:          117.00 cm/s LVOT Vmean:        79.700 cm/s LVOT VTI:          0.279 m LVOT/AV VTI ratio: 0.69  AORTA Ao Root diam: 2.40 cm MITRAL VALVE MV Area (PHT): 2.76 cm     SHUNTS MV Decel Time: 275 msec     Systemic VTI:  0.28 m MV E velocity: 66.00 cm/s   Systemic Diam: 1.80 cm MV A velocity: 118.00 cm/s MV E/A ratio:  0.56 Keller Paterson Electronically signed by Keller Paterson Signature Date/Time: 07/17/2024/5:54:25 PM    Final  MR ANGIO HEAD WO CONTRAST Result Date: 07/16/2024 EXAM: MR Angiography Head without intravenous Contrast. 07/16/2024 06:29:44 PM TECHNIQUE: Magnetic resonance angiography images of the head without intravenous contrast. Multiplanar 2D and 3D reformatted images are provided for review. COMPARISON: None provided. CLINICAL HISTORY: Neuro deficit, acute, stroke suspected; acute/subacute embolic infarcts on MRI. Neurology consulted for acute ischemic stroke, favored to be cardioembolic based on involvement of multiple vascular distributions. Patient will be seen in formal consultation tomorrow. In the meantime I have reviewed her chart and added on orders for MRA head wo, carotid US , and plavix 75mg  daily. FINDINGS: ANTERIOR CIRCULATION: No significant stenosis of the internal carotid arteries. No significant stenosis of the anterior cerebral arteries. No significant stenosis of the middle cerebral arteries. No aneurysm. POSTERIOR CIRCULATION: Limited visualization of the right vertebral artery V4 segment, which appears to be occluded. The left vertebral artery V4 segment is normal. No significant stenosis of the posterior cerebral arteries. No significant stenosis of the basilar artery. No aneurysm. IMPRESSION: 1. 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 Electronically signed by: Franky Stanford MD 07/16/2024 07:40 PM EDT RP Workstation: [id number]   MR Cervical Spine Wo Contrast Result Date: 07/16/2024 EXAM: MRI CERVICAL SPINE WITHOUT  CONTRAST 07/16/2024 08:38:03 AM TECHNIQUE: Multiplanar multisequence MRI of the cervical spine was performed. COMPARISON: Cervical spine CT 02/13/2022 and MRI 11/28/2021. CLINICAL HISTORY: Cervical radiculopathy, left arm weakness, numbness/tingling, history of dialysis. FINDINGS: LIMITATIONS: The examination is motion degraded, severely so on axial images. The patient would not tolerate any attempts at repeat imaging. BONES AND ALIGNMENT: Chronic borderline widening of the atlantodental interval measuring 3 mm, unchanged from the prior MRI. Normal alignment elsewhere. No acute fracture or suspicious marrow lesion in the cervical spine. T5 compression fracture with severe anterior vertebral body height loss and mild to moderate marrow edema, new from a 03/05/2023 chest CT. SPINAL CORD: Grossly normal cervical spinal cord signal within limitations of motion artifact. SOFT TISSUES: No paraspinal mass. DISC LEVELS: Mild cervical spondylosis is similar to the prior MRI with preserved disc space heights. A chronic left paracentral disc protrusion at C2-C3 results in slight left ventral cord flattening. No significant cervical spinal canal stenosis is present. Mild to moderate multilevel neural foraminal stenosis is grossly similar to the prior MRI with assessment limited by motion. IMPRESSION: 1. Motion degraded examination without an acute finding in the cervical spine. 2. T5 compression fracture with severe anterior height loss and mild to moderate marrow edema, new from 2024 and potentially subacute. 3. Mild cervical spondylosis without significant spinal stenosis. Electronically signed by: Dasie Hamburg MD 07/16/2024 09:29 AM EDT RP Workstation: [id number]   MR BRAIN WO CONTRAST Result Date: 07/16/2024 EXAM: MRI BRAIN WITHOUT CONTRAST 07/16/2024 08:38:03 AM TECHNIQUE: Multiplanar multisequence MRI of the head/brain was performed without the administration of intravenous contrast. COMPARISON: Head CT 05/02/2022 and  MRI 10/12/2021. CLINICAL HISTORY: Neuro deficit, acute, stroke suspected; L arm weakness started yesterday. Patient receives dialysis. FINDINGS: The examination is mildly to moderately motion degraded. BRAIN AND VENTRICLES: There is a 1.6 cm focus of cortically based restricted diffusion and T2 hyperintensity in the posteroinferior right occipital lobe, and there is a 1 cm focus of milder diffusion weighted signal abnormality and T2 hyperintensity in the right temporal lobe. Additional punctate foci of mildly restricted diffusion are present in the right occipital lobe, left temporal lobe, left caudate head, posterior right insula, and posterior right frontal lobe. No intracranial hemorrhage, mass, hydrocephalus, midline shift, or  extra axial fluid collection is identified. There is mild cerebral atrophy. Patchy T2 hyperintensities in the cerebral white matter bilaterally have mildly progressed from the prior MRI and are nonspecific but compatible with mild chronic small vessel ischemic disease. A focus of susceptibility in the right sylvian fissure corresponds to likely vascular calcification on CT. Major intracranial vascular flow voids are preserved. ORBITS: Bilateral cataract extraction. SINUSES AND MASTOIDS: No acute abnormality. BONES AND SOFT TISSUES: Normal marrow signal. No acute soft tissue abnormality. IMPRESSION: 1. Scattered small foci of diffusion abnormality in both cerebral hemispheres suspicious for acute to subacute embolic infarcts. 2. Mild chronic small vessel ischemic disease. Electronically signed by: Dasie Hamburg MD 07/16/2024 09:15 AM EDT RP Workstation: [id number]      Labs: BNP (last 3 results) Recent Labs    08/24/23 1332 04/08/24 0605  BNP 429.9* 905.4*   Basic Metabolic Panel: Recent Labs  Lab 07/16/24 0744 07/17/24 0838 07/18/24 0322  NA 143 139 132*  K 4.9 5.6* 4.7  CL 98 99 95*  CO2 25 27 27   GLUCOSE 72 98 87  BUN 51* 67* 26*  CREATININE 7.66* 8.95* 5.01*   CALCIUM  7.2* 6.5* 7.7*  MG  --   --  2.2  PHOS  --  7.0*  --    Liver Function Tests: Recent Labs  Lab 07/17/24 0838  ALBUMIN  2.7*   No results for input(s): LIPASE, AMYLASE in the last 168 hours. No results for input(s): AMMONIA in the last 168 hours. CBC: Recent Labs  Lab 07/16/24 0744 07/17/24 0838 07/18/24 0322  WBC 21.8* 20.9* 19.1*  HGB 14.2 13.7 13.9  HCT 45.9 43.4 43.6  MCV 100.4* 100.7* 100.5*  PLT 504* 540* 566*   Cardiac Enzymes: No results for input(s): CKTOTAL, CKMB, CKMBINDEX, TROPONINI in the last 168 hours. BNP: Invalid input(s): POCBNP CBG: No results for input(s): GLUCAP in the last 168 hours. D-Dimer No results for input(s): DDIMER in the last 72 hours. Hgb A1c Recent Labs    07/17/24 0541  HGBA1C 5.8*   Lipid Profile Recent Labs    07/17/24 0541  CHOL 212*  HDL 56  LDLCALC 144*  TRIG 61  CHOLHDL 3.8   Thyroid function studies No results for input(s): TSH, T4TOTAL, T3FREE, THYROIDAB in the last 72 hours.  Invalid input(s): FREET3 Anemia work up No results for input(s): VITAMINB12, FOLATE, FERRITIN, TIBC, IRON, RETICCTPCT in the last 72 hours. Urinalysis    Component Value Date/Time   COLORURINE STRAW (A) 12/12/2021 1702   APPEARANCEUR CLEAR (A) 12/12/2021 1702   LABSPEC 1.005 12/12/2021 1702   PHURINE 7.0 12/12/2021 1702   GLUCOSEU 50 (A) 12/12/2021 1702   HGBUR MODERATE (A) 12/12/2021 1702   BILIRUBINUR NEGATIVE 12/12/2021 1702   KETONESUR NEGATIVE 12/12/2021 1702   PROTEINUR 100 (A) 12/12/2021 1702   NITRITE NEGATIVE 12/12/2021 1702   LEUKOCYTESUR SMALL (A) 12/12/2021 1702   Sepsis Labs Recent Labs  Lab 07/16/24 0744 07/17/24 0838 07/18/24 0322  WBC 21.8* 20.9* 19.1*   Microbiology No results found for this or any previous visit (from the past 240 hours).   Total time spend on discharging this patient, including the last patient exam, discussing the hospital stay,  instructions for ongoing care as it relates to all pertinent caregivers, as well as preparing the medical discharge records, prescriptions, and/or referrals as applicable, is 35 minutes.    Ellouise Haber, MD  [facility] 07/18/2024, 1:07 PM

## 2024-07-18 NOTE — Progress Notes (Signed)
 OT Cancellation Note  Patient Details Name: Cassandra [name] MRN: [id number] DOB: 12-08-1926   Cancelled Treatment:    Reason Eval/Treat Not Completed: Patient declined, no reason specified. Pt received in bed, stating she is going home today. Adamantly refuses participation in OT evaluation at this time, states she is at her baseline. OT will check back at a later time as appropriate.   Ceairra Mccarver L. Josehua Hammar, OTR/L  07/18/24, 2:15 PM

## 2024-07-18 NOTE — Evaluation (Signed)
 Physical Therapy Evaluation Patient Details Name: JETAIME PINNIX MRN: [id number] DOB: [birth year] Today's Date: 07/18/2024  History of Present Illness  Pt is a 89 y/o F presenting to ED with c/o L arm weakness and numbness/tingling. MRI findings: scattered small foci of diffusion abnormality in both cerebral hemispheres suspicious for acute to subacute embolic infarcts. Workup for stroke. PMH significant for ESRD on HD, HTN, depression with anxiety, neuropathy, restless leg syndrome, HLD, insomnia.   Clinical Impression  Pt A&Ox4, pleasant and agreeable to PT evaluation. At baseline, pt reports being modI with rollator for ambulation, denies hx of falls, IND with ADLs. Pt was received in bed, supervision for bed mobility with use of bed features. Pt performed multiple STS from EOB and toilet, each with CGA and VC for hand placement on RW/rail in bathroom. Pt completed short bout of ambulation, suddenly became incontinent of stool in hallway. Pt able to perform pericare in sitting with supervision. Functional mobility limited this session due to bowel incontinence. SpO2 monitored throughout session, attempted O2 weaning but pt noted to desat to 88% with bed mobility on RA. Pt remained on 2L during remainder of session, SpO2 WFL on 2L [place]. RN informed of SpO2 readings and pt's bowel incontinence. Pt was left seated in recliner at end of session, all needs in reach. Pt would benefit from skilled PT intervention to address listed deficits (see PT Problem List) and allow for safe return to PLOF.         If plan is discharge home, recommend the following: A little help with walking and/or transfers;A little help with bathing/dressing/bathroom;Assist for transportation;Assistance with cooking/housework   Can travel by private vehicle        Equipment Recommendations None recommended by PT  Recommendations for Other Services       Functional Status Assessment Patient has had a recent decline in  their functional status and demonstrates the ability to make significant improvements in function in a reasonable and predictable amount of time.     Precautions / Restrictions Precautions Precautions: Fall Recall of Precautions/Restrictions: Intact Restrictions Weight Bearing Restrictions Per Provider Order: No      Mobility  Bed Mobility Overal bed mobility: Needs Assistance Bed Mobility: Supine to Sit     Supine to sit: Supervision, HOB elevated, Used rails     General bed mobility comments: no physical assistance needed, increased time/effort to complete    Transfers Overall transfer level: Needs assistance Equipment used: Rolling walker (2 wheels) Transfers: Sit to/from Stand Sit to Stand: Contact guard assist           General transfer comment: 1 STS from EOB, 2 from toilet with CGA. VC for hand placement on RW/rail in bathroom    Ambulation/Gait Ambulation/Gait assistance: Contact guard assist Gait Distance (Feet): 20 Feet Assistive device: Rolling walker (2 wheels) Gait Pattern/deviations: Step-through pattern, Trunk flexed, Narrow base of support Gait velocity: decreased     General Gait Details: No LOB, mobility limited by bowel incontinence. VC for RW positioning throughout. Short, shuffled gait pattern throughout  Stairs            Wheelchair Mobility     Tilt Bed    Modified Rankin (Stroke Patients Only)       Balance Overall balance assessment: Needs assistance Sitting-balance support: Feet supported Sitting balance-Leahy Scale: Good Sitting balance - Comments: steady static and dynamic sitting   Standing balance support: Bilateral upper extremity supported Standing balance-Leahy Scale: Fair Standing balance comment: increased postural sway  with no UE support                             Pertinent Vitals/Pain Pain Assessment Pain Assessment: No/denies pain    Home Living Family/patient expects to be discharged  to:: Private residence Living Arrangements: Spouse/significant other Available Help at Discharge: Family;Available PRN/intermittently Type of Home: Apartment Home Access: Level entry       Home Layout: One level Home Equipment: Rollator (4 wheels);Shower seat      Prior Function Prior Level of Function : Independent/Modified Independent             Mobility Comments: Pt reports using rollator for ambulation, denies hx of falls, limited community ambulator ADLs Comments: IND with ADLs, husband drives her     Extremity/Trunk Assessment   Upper Extremity Assessment Upper Extremity Assessment: Defer to OT evaluation    Lower Extremity Assessment Lower Extremity Assessment: Generalized weakness (heel to shin and alternating toe taps WFL, sensation WFL, symmetrical weakness)       Communication   Communication Communication: No apparent difficulties    Cognition Arousal: Alert Behavior During Therapy: WFL for tasks assessed/performed   PT - Cognitive impairments: No apparent impairments                       PT - Cognition Comments: A&Ox4, pleasant and cooperative Following commands: Intact       Cueing Cueing Techniques: Verbal cues, Tactile cues, Visual cues     General Comments General comments (skin integrity, edema, etc.): pt able to perform pericare in sitting with supervision    Exercises Other Exercises Other Exercises: SpO2 monitored during session: pt on 2L [place] initially with SpO2 94%, removed supplemental O2 pt desat to 88 after transfer to sitting. Pt placed back on 2L SpO2 recovered to 93%. After short bout of ambulation SpO2 90%- improved with seated rest to 95% at end of session- RN informed   Assessment/Plan    PT Assessment Patient needs continued PT services  PT Problem List Decreased strength;Decreased activity tolerance;Decreased balance;Decreased mobility       PT Treatment Interventions DME instruction;Gait training;Functional  mobility training;Therapeutic activities;Therapeutic exercise;Balance training;Neuromuscular re-education;Patient/family education    PT Goals (Current goals can be found in the Care Plan section)  Acute Rehab PT Goals Patient Stated Goal: to go home PT Goal Formulation: With patient Time For Goal Achievement: 08/01/24 Potential to Achieve Goals: Fair    Frequency Min 2X/week     Co-evaluation               AM-PAC PT 6 Clicks Mobility  Outcome Measure Help needed turning from your back to your side while in a flat bed without using bedrails?: None Help needed moving from lying on your back to sitting on the side of a flat bed without using bedrails?: A Little Help needed moving to and from a bed to a chair (including a wheelchair)?: A Little Help needed standing up from a chair using your arms (e.g., wheelchair or bedside chair)?: A Little Help needed to walk in hospital room?: A Little Help needed climbing 3-5 steps with a railing? : A Lot 6 Click Score: 18    End of Session Equipment Utilized During Treatment: Gait belt;Oxygen Activity Tolerance: Patient tolerated treatment well Patient left: in chair;with call bell/phone within reach;with chair alarm set Nurse Communication: Mobility status PT Visit Diagnosis: Other abnormalities of gait and mobility (R26.89);Muscle weakness (  generalized) (M62.81)    Time: 9097-9064 PT Time Calculation (min) (ACUTE ONLY): 33 min   Charges:   PT Evaluation $PT Eval Moderate Complexity: 1 Mod   PT General Charges $$ ACUTE PT VISIT: 1 Visit         Janell Axe, SPT

## 2024-07-18 NOTE — TOC Initial Note (Signed)
 Transition of Care [facility]) - Initial/Assessment Note    Patient Details  Name: Cassandra [name] MRN: [id number] Date of Birth: [birth year]  Transition of Care [facility]) CM/SW Contact:    [place] L Debria Broecker, LCSW Phone Number: 07/18/2024, 1:34 PM  Clinical Narrative:                  CSW completed a search on [employer]. Patient active with [facility]. Updated orders sent to the liaison.        Patient Goals and CMS Choice            Expected Discharge Plan and Services         Expected Discharge Date: 07/18/24                                    Prior Living Arrangements/Services                       Activities of Daily Living   ADL Screening (condition at time of admission) Independently performs ADLs?: No Does the patient have a NEW difficulty with bathing/dressing/toileting/self-feeding that is expected to last >3 days?: Yes (Initiates electronic notice to provider for possible OT consult) Does the patient have a NEW difficulty with getting in/out of bed, walking, or climbing stairs that is expected to last >3 days?: Yes (Initiates electronic notice to provider for possible PT consult) Does the patient have a NEW difficulty with communication that is expected to last >3 days?: No Is the patient deaf or have difficulty hearing?: Yes Does the patient have difficulty seeing, even when wearing glasses/contacts?: No Does the patient have difficulty concentrating, remembering, or making decisions?: Yes  Permission Sought/Granted                  Emotional Assessment              Admission diagnosis:  Stroke [facility]) [I63.9] Cerebrovascular accident (CVA) due to embolism of cerebral artery (HCC) [I63.40] Patient Active Problem List   Diagnosis Date Noted   Stroke (HCC) 07/16/2024   ESRD on dialysis (HCC) 07/16/2024   Mechanical complication of AV shunt 06/01/2024   Acute hypoxemic respiratory failure (HCC) 04/08/2024   Complication of AV  dialysis fistula, initial encounter 08/06/2023   Renal failure 03/06/2023   Acute hypoxic respiratory failure (HCC) 03/05/2023   Pneumonia 03/05/2023   ESRD (end stage renal disease) on dialysis (HCC) 03/05/2023   Hyperkalemia 03/05/2023   Overweight (BMI 25.0-29.9) 12/14/2021   Acute renal failure superimposed on stage 4 chronic kidney disease (HCC) 12/14/2021   Hematochezia, recurrent 12/11/2021   Recent cerebrovascular accident 10/12/2021 (CVA) 12/11/2021   Hypotension 12/11/2021   GI bleed 12/11/2021   Acute blood loss anemia 12/11/2021   Cholelithiasis 12/11/2021   Acute CVA (cerebrovascular accident) (HCC) 10/13/2021   Leukocytosis 10/13/2021   Rectal bleeding 10/03/2021   Bright red rectal bleeding 10/02/2021   GIB (gastrointestinal bleeding) 10/02/2021   Hypoxemia 05/15/2021   Hypoxia 05/14/2021   Hypertensive urgency 05/14/2021   Tibial plateau fracture 04/21/21, left, closed, with routine healing, subsequent encounter 05/14/2021   CKD (chronic kidney disease) stage 4, GFR 15-29 ml/min (HCC) 04/07/2021   Acquired hypothyroidism 12/02/2020   Hyperlipidemia, mixed 12/02/2020   Aortic atherosclerosis 10/04/2020   Polycythemia vera (HCC) 06/02/2020   Thrombocytosis 02/18/2020   Vitamin D  deficiency 03/25/2018   Hypertension 01/24/2015   Diabetes mellitus, type II (  HCC) 01/24/2015   Restless legs syndrome 01/24/2015   Chronic constipation 01/24/2015   Lumbar stenosis with neurogenic claudication 05/04/2014   Peripheral neuropathy 03/09/2014   CAD (coronary artery disease), native coronary artery 01/07/2014   PCP:  Cleotilde Oneil FALCON, MD Pharmacy:   [facility] [phone] [address], [facility] [address] - [address] [place] [address] Phone: [phone] Fax: [phone]  [facility] - [place], [place] - [address] [place] [address] Phone: [phone] Fax: [phone]     Social Drivers of Health  (SDOH) Social History: SDOH Screenings   Food Insecurity: No Food Insecurity (07/17/2024)  Housing: Low Risk  (07/17/2024)  Transportation Needs: No Transportation Needs (07/17/2024)  Utilities: Not At Risk (07/17/2024)  Financial Resource Strain: Low Risk  (06/25/2024)   Received from [facility]  Social Connections: Moderately Integrated (07/17/2024)  Tobacco Use: Medium Risk (07/16/2024)   SDOH Interventions:     Readmission Risk Interventions    03/09/2023    4:09 PM  Readmission Risk Prevention Plan  Transportation Screening Complete  PCP or Specialist Appt within 3-5 Days Complete  HRI or Home Care Consult Complete  Social Work Consult for Recovery Care Planning/Counseling Complete  Palliative Care Screening Not Applicable  Medication Review [employer]) Complete

## 2024-07-18 NOTE — Progress Notes (Signed)
 [facility]  ROUNDING NOTE   Subjective:   Cassandra [name] is a 89 year old female with past medical conditions including anxiety, hypertension, RLS, neuropathy, and end-stage renal disease [name] hemodialysis.  Patient presents to the emergency department with left-sided weakness and has been admitted for Stroke [facility]) [I63.9] Cerebrovascular accident (CVA) due to embolism of cerebral artery [facility]) [I63.40]  Patient is known to our practice and receives dialysis at [name] a TTS, supervised by Dr Marcelino. Last treatment received [name] Tuesday, partial treatment received.   Patient seen sitting up in chair Currently working with therapy States she feel well today' Hopeful for discharge.   Objective:  Vital signs in last 24 hours:  Temp:  [97.7 F (36.5 C)-98.9 F (37.2 C)] 98.9 F (37.2 C) (10/24 0943) Pulse Rate:  [64-70] 70 (10/24 0943) Resp:  [12-20] 20 (10/24 0943) BP: (122-196)/(50-67) 137/50 (10/24 0943) SpO2:  [93 %-97 %] 97 % (10/24 0943) Weight:  [55.1 kg] 55.1 kg (10/23 1256)  Weight change:  Filed Weights   07/16/24 0627 07/17/24 0755 07/17/24 1256  Weight: 57.6 kg 55.7 kg 55.1 kg    Intake/Output: I/O last 3 completed shifts: In: 490 [P.O.:490] Out: 1000 [Other:1000]   Intake/Output this shift:  Total I/O In: 120 [P.O.:120] Out: -   Physical Exam: General: NAD  Head: Normocephalic, atraumatic. Moist oral mucosal membranes  Eyes: Anicteric  Lungs:  Clear to auscultation, normal effort  Heart: Regular rate and rhythm  Abdomen:  Soft, nontender  Extremities: Trace peripheral edema.  Neurologic: Awake, alert, conversant  Skin: Warm,dry, no rash  Access: Left upper aVF    Basic Metabolic Panel: Recent Labs  Lab 07/16/24 0744 07/17/24 0838 07/18/24 0322  NA 143 139 132*  K 4.9 5.6* 4.7  CL 98 99 95*  CO2 25 27 27   GLUCOSE 72 98 87  BUN 51* 67* 26*  CREATININE 7.66* 8.95* 5.01*  CALCIUM  7.2* 6.5* 7.7*  MG  --   --  2.2   PHOS  --  7.0*  --     Liver Function Tests: Recent Labs  Lab 07/17/24 0838  ALBUMIN  2.7*   No results for input(s): LIPASE, AMYLASE in the last 168 hours. No results for input(s): AMMONIA in the last 168 hours.  CBC: Recent Labs  Lab 07/16/24 0744 07/17/24 0838 07/18/24 0322  WBC 21.8* 20.9* 19.1*  HGB 14.2 13.7 13.9  HCT 45.9 43.4 43.6  MCV 100.4* 100.7* 100.5*  PLT 504* 540* 566*    Cardiac Enzymes: No results for input(s): CKTOTAL, CKMB, CKMBINDEX, TROPONINI in the last 168 hours.  BNP: Invalid input(s): POCBNP  CBG: No results for input(s): GLUCAP in the last 168 hours.  Microbiology: Results for orders placed or performed during the hospital encounter of 08/24/23  Blood culture (routine x 2)     Status: None   Collection Time: 08/24/23  5:21 PM   Specimen: BLOOD  Result Value Ref Range Status   Specimen Description BLOOD BLOOD RIGHT FOREARM  Final   Special Requests   Final    BOTTLES DRAWN AEROBIC AND ANAEROBIC Blood Culture adequate volume   Culture   Final    NO GROWTH 5 DAYS Performed at [facility], [address] [place], [place] [address]    Report Status 08/29/2023 FINAL  Final  Blood culture (routine x 2)     Status: None   Collection Time: 08/24/23  8:36 PM   Specimen: BLOOD RIGHT ARM  Result Value Ref Range  Status   Specimen Description BLOOD RIGHT ARM  Final   Special Requests   Final    BOTTLES DRAWN AEROBIC AND ANAEROBIC Blood Culture results may not be optimal due to an excessive volume of blood received in culture bottles   Culture   Final    NO GROWTH 5 DAYS Performed at [facility], [address] [place], [place] [address]    Report Status 08/29/2023 FINAL  Final  MRSA Next Gen by PCR, Nasal     Status: None   Collection Time: 08/25/23  5:38 PM   Specimen: Nasal Mucosa; Nasal Swab  Result Value Ref Range Status   MRSA by PCR Next Gen NOT DETECTED NOT DETECTED Final    Comment:  (NOTE) The GeneXpert MRSA Assay (FDA approved for NASAL specimens only), is one component of a comprehensive MRSA colonization surveillance program. It is not intended to diagnose MRSA infection nor to guide or monitor treatment for MRSA infections. Test performance is not FDA approved in patients less than 30 years old. Performed at [facility], [address] [place], [place] [address]     Coagulation Studies: No results for input(s): LABPROT, INR in the last 72 hours.  Urinalysis: No results for input(s): COLORURINE, LABSPEC, PHURINE, GLUCOSEU, HGBUR, BILIRUBINUR, KETONESUR, PROTEINUR, UROBILINOGEN, NITRITE, LEUKOCYTESUR in the last 72 hours.  Invalid input(s): APPERANCEUR    Imaging: ECHOCARDIOGRAM COMPLETE BUBBLE STUDY Result Date: 07/17/2024    ECHOCARDIOGRAM REPORT   Patient Name:   Cassandra [name] Date of Exam: 07/17/2024 Medical Rec #:  [id number]         Height:       61.0 in Accession #:    [id number]        Weight:       121.5 lb Date of Birth:  09/09/1927        BSA:          1.528 m Patient Age:    89 years          BP:           170/63 mmHg Patient Gender: F                 HR:           66 bpm. Exam Location:  [facility] Procedure: 2D Echo, Cardiac Doppler, Color Doppler and Saline Contrast Bubble            Study (Both Spectral and Color Flow Doppler were utilized during            procedure). Indications:     Stroke  History:         Patient has prior history of Echocardiogram examinations, most                  recent 03/06/2023. CAD; Risk Factors:Hypertension, Diabetes and                  Dyslipidemia. CKD, ESRD.  Sonographer:     Philomena Daring Referring Phys:  [id number] AMY N COX Diagnosing Phys: Keller Alluri IMPRESSIONS  1. Left ventricular ejection fraction, by estimation, is 60 to 65%. The left ventricle has normal function. The left ventricle has no regional wall motion abnormalities. There is mild left ventricular hypertrophy. Left  ventricular diastolic parameters are consistent with Grade I diastolic dysfunction (impaired relaxation).  2. Right ventricular systolic function is normal. The right ventricular size is normal.  3. The mitral valve is normal in structure. No evidence  of mitral valve regurgitation.  4. The aortic valve is tricuspid. Aortic valve regurgitation is mild. Aortic valve sclerosis is present, with no evidence of aortic valve stenosis.  5. Agitated saline contrast bubble study was negative, with no evidence of any interatrial shunt. FINDINGS  Left Ventricle: Left ventricular ejection fraction, by estimation, is 60 to 65%. The left ventricle has normal function. The left ventricle has no regional wall motion abnormalities. The left ventricular internal cavity size was normal in size. There is  mild left ventricular hypertrophy. Left ventricular diastolic parameters are consistent with Grade I diastolic dysfunction (impaired relaxation). Right Ventricle: The right ventricular size is normal. No increase in right ventricular wall thickness. Right ventricular systolic function is normal. Left Atrium: Left atrial size was normal in size. Right Atrium: Right atrial size was normal in size. Pericardium: There is no evidence of pericardial effusion. Mitral Valve: The mitral valve is normal in structure. No evidence of mitral valve regurgitation. Tricuspid Valve: The tricuspid valve is normal in structure. Tricuspid valve regurgitation is trivial. Aortic Valve: The aortic valve is tricuspid. Aortic valve regurgitation is mild. Aortic valve sclerosis is present, with no evidence of aortic valve stenosis. Aortic valve mean gradient measures 7.0 mmHg. Aortic valve peak gradient measures 13.4 mmHg. Aortic valve area, by VTI measures 1.75 cm. Pulmonic Valve: The pulmonic valve was not well visualized. Pulmonic valve regurgitation is trivial. Aorta: The aortic root and ascending aorta are structurally normal, with no evidence of  dilitation. IAS/Shunts: The atrial septum is grossly normal. Agitated saline contrast was given intravenously to evaluate for intracardiac shunting. Agitated saline contrast bubble study was negative, with no evidence of any interatrial shunt.  LEFT VENTRICLE PLAX 2D LVIDd:         4.30 cm   Diastology LVIDs:         2.80 cm   LV e' medial:    5.44 cm/s LV PW:         1.00 cm   LV E/e' medial:  12.1 LV IVS:        1.10 cm   LV e' lateral:   7.07 cm/s LVOT diam:     1.80 cm   LV E/e' lateral: 9.3 LV SV:         71 LV SV Index:   46 LVOT Area:     2.54 cm  RIGHT VENTRICLE             IVC RV S prime:     12.50 cm/s  IVC diam: 1.70 cm TAPSE (M-mode): 2.4 cm LEFT ATRIUM             Index        RIGHT ATRIUM           Index LA diam:        2.50 cm 1.64 cm/m   RA Area:     10.60 cm LA Vol (A2C):   39.9 ml 26.11 ml/m  RA Volume:   17.20 ml  11.26 ml/m LA Vol (A4C):   22.7 ml 14.86 ml/m LA Biplane Vol: 30.7 ml 20.09 ml/m  AORTIC VALVE AV Area (Vmax):    1.63 cm AV Area (Vmean):   1.66 cm AV Area (VTI):     1.75 cm AV Vmax:           183.00 cm/s AV Vmean:          122.000 cm/s AV VTI:            0.405  m AV Peak Grad:      13.4 mmHg AV Mean Grad:      7.0 mmHg LVOT Vmax:         117.00 cm/s LVOT Vmean:        79.700 cm/s LVOT VTI:          0.279 m LVOT/AV VTI ratio: 0.69  AORTA Ao Root diam: 2.40 cm MITRAL VALVE MV Area (PHT): 2.76 cm     SHUNTS MV Decel Time: 275 msec     Systemic VTI:  0.28 m MV E velocity: 66.00 cm/s   Systemic Diam: 1.80 cm MV A velocity: 118.00 cm/s MV E/A ratio:  0.56 Keller Paterson Electronically signed by Keller Paterson Signature Date/Time: 07/17/2024/5:54:25 PM    Final    MR ANGIO HEAD WO CONTRAST Result Date: 07/16/2024 EXAM: MR Angiography Head without intravenous Contrast. 07/16/2024 06:29:44 PM TECHNIQUE: Magnetic resonance angiography images of the head without intravenous contrast. Multiplanar 2D and 3D reformatted images are provided for review. COMPARISON: None provided.  CLINICAL HISTORY: Neuro deficit, acute, stroke suspected; acute/subacute embolic infarcts [name] MRI. Neurology consulted for acute ischemic stroke, favored to be cardioembolic based [name] involvement of multiple vascular distributions. Patient will be seen in formal consultation tomorrow. In the meantime I have reviewed her chart and added [name] orders for MRA head wo, carotid US , and plavix 75mg  daily. FINDINGS: ANTERIOR CIRCULATION: No significant stenosis of the internal carotid arteries. No significant stenosis of the anterior cerebral arteries. No significant stenosis of the middle cerebral arteries. No aneurysm. POSTERIOR CIRCULATION: Limited visualization of the right vertebral artery V4 segment, which appears to be occluded. The left vertebral artery V4 segment is normal. No significant stenosis of the posterior cerebral arteries. No significant stenosis of the basilar artery. No aneurysm. IMPRESSION: 1. 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 Electronically signed by: Franky Stanford MD 07/16/2024 07:40 PM EDT RP Workstation: [id number]     Medications:     amLODipine   10 mg Oral Daily   aspirin  EC  81 mg Oral Daily   calcium  acetate  1,334 mg Oral TID with meals   Chlorhexidine  Gluconate Cloth  6 each Topical Q0600   cloNIDine   0.1 mg Oral BID   clopidogrel  75 mg Oral Daily   gabapentin   100 mg Oral BID   gabapentin   200 mg Oral QHS   heparin  injection (subcutaneous)  5,000 Units Subcutaneous Q8H   latanoprost   1 drop Both Eyes QHS   levothyroxine   88 mcg Oral Q0600   rOPINIRole   0.5 mg Oral BID   rosuvastatin   10 mg Oral QHS   sertraline  50 mg Oral Daily   timolol   1 drop Both Eyes q morning   traZODone   50 mg Oral QHS   acetaminophen  **OR** acetaminophen  (TYLENOL ) oral liquid 160 mg/5 mL **OR** acetaminophen , ALPRAZolam , hydrALAZINE , HYDROcodone -acetaminophen , senna-docusate  Assessment/ Plan:  Ms. WANA MOUNT is a 89 y.o.   female with past medical conditions including anxiety, hypertension, RLS, neuropathy, and end-stage renal disease [name] hemodialysis.  Patient presents to the emergency department with left-sided weakness and has been admitted for Stroke [facility]) [I63.9] Cerebrovascular accident (CVA) due to embolism of cerebral artery (HCC) [I63.40]  [facility]/TTS/left aVF   Stroke, brain MRI suspicious for acute to subacute embolic infarcts.  Neurology is consulted.  Workup remains in progress.  Echo shows EF 60-65% with a grade 1 diastolic dysfunction.   2.  End-stage renal disease  [name] hemodialysis.  Patient received partial treatment [name] Tuesday.  Dialysis received yesterday, UF 1L achieved.  Next treatment scheduled for Saturday.  3. Anemia of chronic kidney disease Lab Results  Component Value Date   HGB 13.9 07/18/2024    Hemoglobin stable.  No need for ESA at this time.  4. Secondary Hyperparathyroidism: with outpatient labs: None available Lab Results  Component Value Date   CALCIUM  7.7 (L) 07/18/2024   CAION 1.11 (L) 05/25/2023   PHOS 7.0 (H) 07/17/2024    Will continue to monitor bone minerals during this admission.  S calcium  improving. Continue calcium  acetate with meals to manage phosphorus.   LOS: 0 Millisa Giarrusso 10/24/202512:26 PM

## 2024-07-18 NOTE — Plan of Care (Signed)

## 2024-07-18 NOTE — Plan of Care (Signed)

## 2024-07-18 NOTE — Progress Notes (Signed)
 D/C order noted. Contacted [facility] to be advised of pt and d/c today. The pt should resume care on 07/21/24. Requested documents faxed to clinic for continuation of care.  Suzen Satchel Dialysis Navigator [phone]

## 2024-07-23 NOTE — Progress Notes (Signed)
 Patient Profile:   Cassandra [name]  is a 89 y.o.  female Chief Complaint  Patient presents with  . Hospital Follow Up    Has sore throat       PROBLEM LIST: Past Medical History:  Diagnosis Date  . Acquired hypothyroidism   . Anxiety   . Arthritis   . CAD (coronary artery disease)    A CARDIAC CATHETER AND INTRACORONARY STENTING DONE   . Colon perforation (CMS/HHS-HCC)    History of colon perforation secondary to colonoscopy- required colostomy and reversal of colostomy  . Depression    FOLLOWED BY [facility]  . Diabetes mellitus type 2, uncomplicated (CMS/HHS-HCC)   . Essential hypertension   . Glaucoma   . Insomnia   . Lumbar stenosis with neurogenic claudication 05/04/2014  . Neuropathy   . Perforated sigmoid colon (CMS/HHS-HCC)   . Pure hypercholesterolemia   . RLS (restless legs syndrome)     Past Surgical History:  Procedure Laterality Date  . COLON PERFORATION  2008   SECONDARY TO COLONOSCOPY  . Surgery to repair colon perforation  2008  . Left knee surgery  02/13/12  . Bilateral feet surgery    . CATARACT EXTRACTION    . COLOSTOMY    . Colostomy reversal    . EYELID SURGERY    . HYSTERECTOMY    . Right knee surgery      ALLERGIES: Allergies  Allergen Reactions  . Citalopram Hallucination  . Darvocet A500 [Propoxyphene N-Acetaminophen ] Hallucination  . Flexeril [Cyclobenzaprine] Hallucination  . Lipitor [Atorvastatin] Other (See Comments)    MYALGIA  . Mirtazapine Hallucination  . Paxil [Paroxetine Hcl] Hallucination  . Penicillins Swelling    Lip and orbital swelling  . Percocet [Oxycodone-Acetaminophen ] Hallucination  . Propoxyphene Unknown    CURRENT MEDICATIONS: Current Outpatient Medications  Medication Sig Dispense Refill  . ALPRAZolam  (XANAX ) 0.25 MG tablet Take 1 tablet (0.25 mg total) by mouth once daily as needed    . amLODIPine  (NORVASC ) 5 MG tablet Take 1 tablet (5 mg total) by mouth once  daily 90 tablet 3  . aspirin  81 MG EC tablet Take 81 mg by mouth once daily    . azelastine  (ASTELIN ) 137 mcg nasal spray Place 2 sprays into both nostrils 2 (two) times daily    . blood glucose diagnostic (ONETOUCH ULTRA TEST) test strip CHECK BLOOD SUGAR ONCE DAILY FASTING. DX E11.22 100 strip 1  . blood glucose diagnostic test strip 1 each (1 strip total) once daily Use as instructed. Dx E11.22 100 each 3  . blood glucose diagnostic test strip Check blood sugar once daily fasting. Dx E11.22 100 each 1  . blood glucose meter kit as directed Dx E11.22 1 each 0  . blood glucose meter kit Use as directed ONE TOUCH ULTRA Dx E11.22 1 each 0  . clopidogreL (PLAVIX) 75 mg tablet Take 1 tablet (75 mg total) by mouth once daily 90 tablet 3  . CRYODOSE TA MIST SPRAY topical spray SPRAY 1 AS DIRECTED FOR USE DURING DIALYSIS TO ACCESS    . DROXIA  300 mg capsule TAKE 1 CAPSULE BY MOUTH ONCE DAILY. 90 capsule 3  . gabapentin  (NEURONTIN ) 100 MG capsule Take 1 capsule (100 mg total) by mouth 3 (three) times daily (Patient taking differently: Take 100 mg by mouth 3 (three) times daily Takes 1-2 by mouth  three times a day) 90 capsule 5  . [START ON 07/25/2024] HYDROcodone -acetaminophen  (NORCO) 5-325 mg tablet 1/2-1 po tid prn (Patient taking differently: One tab every 6 hours as needed) 90 tablet 0  . HYDROcodone -acetaminophen  (NORCO) 5-325 mg tablet 1/2-1 po tid prn (Patient taking differently: One every 6 hours as needed) 90 tablet 0  . lancets Use 1 each once daily Use as instructed. Dx E11.22 100 each 2  . latanoprost  (XALATAN ) 0.005 % ophthalmic solution Place 1 drop into both eyes nightly.      . levothyroxine  (SYNTHROID ) 88 MCG tablet TAKE 1 TABLET (88 MCG TOTAL) BY MOUTH EVERY MORNING BEFORE BREAKFAST (0630) ON EMPTY STOMACH WITH A GLASS OF WATER AT LEAST 30-60 MINUTES BEFORE BREAKFAST 90 tablet 3  . naloxone (NARCAN) 4 mg/actuation nasal spray Place 1 spray (4 mg total) into one nostril once as needed (if  not breathing.) for up to 1 dose 1 each 0  . rOPINIRole  (REQUIP ) 0.5 MG immediate release tablet Take 0.5 mg by mouth as directed One in the am and one at bedtime    . rosuvastatin  (CRESTOR ) 20 MG tablet Take 0.5 tablets (10 mg total) by mouth once daily    . sertraline (ZOLOFT) 50 MG tablet Take 1 tablet (50 mg total) by mouth once daily 90 tablet 3  . timolol  (TIMOPTIC -XE) 0.5 % ophthalmic gel-forming solution Place 1 drop into both eyes every morning    . traZODone  (DESYREL ) 50 MG tablet Take 1 tablet (50 mg total) by mouth at bedtime 1/2-1 pill bedtime 90 tablet 3  . hydrOXYzine HCL (ATARAX) 10 MG tablet Take 1 tablet (10 mg total) by mouth at bedtime 90 tablet 3   No current facility-administered medications for this visit.      HPI   CLINICAL SUMMARY:  Patient presents 6 days posthospitalization.  Was admitted with acute CVA.  Right vertebral artery occlusion creating subacute infarcts in the occipital and parietal areas.  Plavix started.  Initially had hemiparesis over left side, resolved within a few hours.  Found to have a T5 compression fracture but patient asymptomatic.  They started her on hydroxyzine at bedtime which really helped her itching.  She has been bruising some from dialysis.  ROS: Review of systems is unremarkable for any active cardiac, respiratory, GI, GU, hematologic, neurologic, dermatologic, HEENT, or psychiatric symptoms except as noted above, 10 systems reviewed.  No fevers, chills, or constitutional symptoms.   PHYSICAL EXAM  Vital signs:  BP 120/60   Pulse 64   Ht 148.6 cm (4' 10.5)   Wt 56.2 kg (123 lb 12.8 oz)   LMP  (LMP Unknown)   SpO2 (!) 90%   BMI 25.43 kg/m  Body mass index is 25.43 kg/m.   Wt Readings from Last 3 Encounters:  07/23/24 56.2 kg (123 lb 12.8 oz)  06/25/24 56.1 kg (123 lb 9.6 oz)  06/23/24 53.6 kg (118 lb 2.7 oz)     BP Readings from Last 3 Encounters:  07/23/24 120/60  06/25/24 136/66  06/23/24 (!) 152/66     Constitutional:NAD Neck: supple, no thyromegaly, good ROM Respiratory:clear to auscultation, no rales or wheezes Cardiovascular:RRR, no murmur or gallop Abdominal:soft, good BS, NT Ext: no edema, good peripheral pulses Neuro: alert and oriented X 3, grossly nonfocal     ASSESSMENT/PLAN   Post CVA-right vertebral artery occlusion, had transient left arm paresis, resolved within about 3 to 5 hours.  Plavix 75 mg added to aspirin  81 mg daily, some bruising with dialysis  Chronic itching-on her back, hydroxyzine and trazodone  seem to work, medicine E scribed Mild depression-on Zoloft Lumbar stenosis-on gabapentin , hydrocodone   Dispo:   Return in about 3 months (around 10/23/2024) for followup.

## 2024-07-26 ENCOUNTER — Emergency Department

## 2024-07-26 ENCOUNTER — Other Ambulatory Visit: Payer: Self-pay

## 2024-07-26 ENCOUNTER — Inpatient Hospital Stay
Admission: EM | Admit: 2024-07-26 | Discharge: 2024-08-06 | DRG: 871 | Disposition: A | Discharge status: Skilled Nursing Facility | Attending: Student | Admitting: Student

## 2024-07-26 DIAGNOSIS — Z9842 Cataract extraction status, left eye: Secondary | ICD-10-CM

## 2024-07-26 DIAGNOSIS — G9340 Encephalopathy, unspecified: Secondary | ICD-10-CM | POA: Diagnosis present

## 2024-07-26 DIAGNOSIS — F32A Depression, unspecified: Secondary | ICD-10-CM | POA: Diagnosis present

## 2024-07-26 DIAGNOSIS — E1140 Type 2 diabetes mellitus with diabetic neuropathy, unspecified: Secondary | ICD-10-CM | POA: Diagnosis present

## 2024-07-26 DIAGNOSIS — T3695XA Adverse effect of unspecified systemic antibiotic, initial encounter: Secondary | ICD-10-CM | POA: Diagnosis not present

## 2024-07-26 DIAGNOSIS — S2241XA Multiple fractures of ribs, right side, initial encounter for closed fracture: Secondary | ICD-10-CM | POA: Diagnosis present

## 2024-07-26 DIAGNOSIS — E039 Hypothyroidism, unspecified: Secondary | ICD-10-CM | POA: Diagnosis present

## 2024-07-26 DIAGNOSIS — E1122 Type 2 diabetes mellitus with diabetic chronic kidney disease: Secondary | ICD-10-CM | POA: Diagnosis present

## 2024-07-26 DIAGNOSIS — E872 Acidosis, unspecified: Secondary | ICD-10-CM | POA: Diagnosis present

## 2024-07-26 DIAGNOSIS — Z79890 Hormone replacement therapy (postmenopausal): Secondary | ICD-10-CM

## 2024-07-26 DIAGNOSIS — J9611 Chronic respiratory failure with hypoxia: Secondary | ICD-10-CM | POA: Diagnosis present

## 2024-07-26 DIAGNOSIS — Z961 Presence of intraocular lens: Secondary | ICD-10-CM | POA: Diagnosis present

## 2024-07-26 DIAGNOSIS — I2489 Other forms of acute ischemic heart disease: Secondary | ICD-10-CM | POA: Diagnosis present

## 2024-07-26 DIAGNOSIS — G9349 Other encephalopathy: Secondary | ICD-10-CM | POA: Diagnosis present

## 2024-07-26 DIAGNOSIS — I6340 Cerebral infarction due to embolism of unspecified cerebral artery: Secondary | ICD-10-CM | POA: Diagnosis not present

## 2024-07-26 DIAGNOSIS — Z87891 Personal history of nicotine dependence: Secondary | ICD-10-CM

## 2024-07-26 DIAGNOSIS — I214 Non-ST elevation (NSTEMI) myocardial infarction: Secondary | ICD-10-CM

## 2024-07-26 DIAGNOSIS — G2581 Restless legs syndrome: Secondary | ICD-10-CM | POA: Diagnosis present

## 2024-07-26 DIAGNOSIS — Z992 Dependence on renal dialysis: Secondary | ICD-10-CM

## 2024-07-26 DIAGNOSIS — I959 Hypotension, unspecified: Secondary | ICD-10-CM | POA: Diagnosis present

## 2024-07-26 DIAGNOSIS — R6520 Severe sepsis without septic shock: Secondary | ICD-10-CM | POA: Diagnosis present

## 2024-07-26 DIAGNOSIS — J189 Pneumonia, unspecified organism: Secondary | ICD-10-CM | POA: Diagnosis present

## 2024-07-26 DIAGNOSIS — D631 Anemia in chronic kidney disease: Secondary | ICD-10-CM | POA: Diagnosis present

## 2024-07-26 DIAGNOSIS — Z7982 Long term (current) use of aspirin: Secondary | ICD-10-CM

## 2024-07-26 DIAGNOSIS — Z1152 Encounter for screening for COVID-19: Secondary | ICD-10-CM

## 2024-07-26 DIAGNOSIS — I700 Atherosclerosis of aorta: Secondary | ICD-10-CM | POA: Diagnosis present

## 2024-07-26 DIAGNOSIS — I252 Old myocardial infarction: Secondary | ICD-10-CM

## 2024-07-26 DIAGNOSIS — J9621 Acute and chronic respiratory failure with hypoxia: Secondary | ICD-10-CM | POA: Diagnosis present

## 2024-07-26 DIAGNOSIS — I6349 Cerebral infarction due to embolism of other cerebral artery: Secondary | ICD-10-CM | POA: Diagnosis not present

## 2024-07-26 DIAGNOSIS — R29707 NIHSS score 7: Secondary | ICD-10-CM | POA: Diagnosis present

## 2024-07-26 DIAGNOSIS — J9601 Acute respiratory failure with hypoxia: Secondary | ICD-10-CM | POA: Diagnosis present

## 2024-07-26 DIAGNOSIS — Z66 Do not resuscitate: Secondary | ICD-10-CM | POA: Diagnosis present

## 2024-07-26 DIAGNOSIS — Z955 Presence of coronary angioplasty implant and graft: Secondary | ICD-10-CM

## 2024-07-26 DIAGNOSIS — F419 Anxiety disorder, unspecified: Secondary | ICD-10-CM | POA: Diagnosis present

## 2024-07-26 DIAGNOSIS — N186 End stage renal disease: Secondary | ICD-10-CM | POA: Diagnosis not present

## 2024-07-26 DIAGNOSIS — M19042 Primary osteoarthritis, left hand: Secondary | ICD-10-CM | POA: Diagnosis present

## 2024-07-26 DIAGNOSIS — A419 Sepsis, unspecified organism: Secondary | ICD-10-CM | POA: Diagnosis present

## 2024-07-26 DIAGNOSIS — E7800 Pure hypercholesterolemia, unspecified: Secondary | ICD-10-CM | POA: Diagnosis present

## 2024-07-26 DIAGNOSIS — Z90710 Acquired absence of both cervix and uterus: Secondary | ICD-10-CM

## 2024-07-26 DIAGNOSIS — R0602 Shortness of breath: Secondary | ICD-10-CM | POA: Diagnosis not present

## 2024-07-26 DIAGNOSIS — I120 Hypertensive chronic kidney disease with stage 5 chronic kidney disease or end stage renal disease: Secondary | ICD-10-CM | POA: Diagnosis present

## 2024-07-26 DIAGNOSIS — I639 Cerebral infarction, unspecified: Secondary | ICD-10-CM | POA: Diagnosis not present

## 2024-07-26 DIAGNOSIS — Z8249 Family history of ischemic heart disease and other diseases of the circulatory system: Secondary | ICD-10-CM

## 2024-07-26 DIAGNOSIS — Z7902 Long term (current) use of antithrombotics/antiplatelets: Secondary | ICD-10-CM

## 2024-07-26 DIAGNOSIS — E785 Hyperlipidemia, unspecified: Secondary | ICD-10-CM | POA: Insufficient documentation

## 2024-07-26 DIAGNOSIS — Z6824 Body mass index (BMI) 24.0-24.9, adult: Secondary | ICD-10-CM

## 2024-07-26 DIAGNOSIS — Z880 Allergy status to penicillin: Secondary | ICD-10-CM

## 2024-07-26 DIAGNOSIS — I10 Essential (primary) hypertension: Secondary | ICD-10-CM | POA: Insufficient documentation

## 2024-07-26 DIAGNOSIS — M19041 Primary osteoarthritis, right hand: Secondary | ICD-10-CM | POA: Diagnosis present

## 2024-07-26 DIAGNOSIS — E875 Hyperkalemia: Secondary | ICD-10-CM | POA: Diagnosis present

## 2024-07-26 DIAGNOSIS — Z9841 Cataract extraction status, right eye: Secondary | ICD-10-CM

## 2024-07-26 DIAGNOSIS — Z90721 Acquired absence of ovaries, unilateral: Secondary | ICD-10-CM

## 2024-07-26 DIAGNOSIS — K626 Ulcer of anus and rectum: Secondary | ICD-10-CM | POA: Diagnosis present

## 2024-07-26 DIAGNOSIS — R7989 Other specified abnormal findings of blood chemistry: Secondary | ICD-10-CM | POA: Diagnosis not present

## 2024-07-26 DIAGNOSIS — Z888 Allergy status to other drugs, medicaments and biological substances status: Secondary | ICD-10-CM

## 2024-07-26 DIAGNOSIS — Z885 Allergy status to narcotic agent status: Secondary | ICD-10-CM

## 2024-07-26 DIAGNOSIS — R471 Dysarthria and anarthria: Secondary | ICD-10-CM | POA: Diagnosis present

## 2024-07-26 DIAGNOSIS — R197 Diarrhea, unspecified: Secondary | ICD-10-CM | POA: Diagnosis not present

## 2024-07-26 DIAGNOSIS — Z8673 Personal history of transient ischemic attack (TIA), and cerebral infarction without residual deficits: Secondary | ICD-10-CM

## 2024-07-26 DIAGNOSIS — Z515 Encounter for palliative care: Secondary | ICD-10-CM | POA: Diagnosis not present

## 2024-07-26 DIAGNOSIS — I2510 Atherosclerotic heart disease of native coronary artery without angina pectoris: Secondary | ICD-10-CM | POA: Diagnosis present

## 2024-07-26 DIAGNOSIS — K5730 Diverticulosis of large intestine without perforation or abscess without bleeding: Secondary | ICD-10-CM | POA: Diagnosis present

## 2024-07-26 DIAGNOSIS — Z79899 Other long term (current) drug therapy: Secondary | ICD-10-CM

## 2024-07-26 LAB — COMPREHENSIVE METABOLIC PANEL WITH GFR
ALT: 18 U/L (ref 0–44)
AST: 40 U/L (ref 15–41)
Albumin: 3 g/dL — ABNORMAL LOW (ref 3.5–5.0)
Alkaline Phosphatase: 61 U/L (ref 38–126)
Anion gap: 18 — ABNORMAL HIGH (ref 5–15)
BUN: 20 mg/dL (ref 8–23)
CO2: 26 mmol/L (ref 22–32)
Calcium: 8.1 mg/dL — ABNORMAL LOW (ref 8.9–10.3)
Chloride: 91 mmol/L — ABNORMAL LOW (ref 98–111)
Creatinine, Ser: 5.46 mg/dL — ABNORMAL HIGH (ref 0.44–1.00)
GFR, Estimated: 7 mL/min — ABNORMAL LOW (ref >60)
Glucose, Bld: 71 mg/dL (ref 70–99)
Potassium: 4.8 mmol/L (ref 3.5–5.1)
Sodium: 135 mmol/L (ref 135–145)
Total Bilirubin: 1.1 mg/dL (ref 0.0–1.2)
Total Protein: 7.5 g/dL (ref 6.5–8.1)

## 2024-07-26 LAB — CBC WITH DIFFERENTIAL/PLATELET
Abs Immature Granulocytes: 1.45 K/uL — ABNORMAL HIGH (ref 0.00–0.07)
Basophils Absolute: 0.2 K/uL — ABNORMAL HIGH (ref 0.0–0.1)
Basophils Relative: 1 %
Eosinophils Absolute: 0.1 K/uL (ref 0.0–0.5)
Eosinophils Relative: 0 %
HCT: 52.2 % — ABNORMAL HIGH (ref 36.0–46.0)
Hemoglobin: 16.2 g/dL — ABNORMAL HIGH (ref 12.0–15.0)
Immature Granulocytes: 4 %
Lymphocytes Relative: 3 %
Lymphs Abs: 1.2 K/uL (ref 0.7–4.0)
MCH: 31.4 pg (ref 26.0–34.0)
MCHC: 31 g/dL (ref 30.0–36.0)
MCV: 101.2 fL — ABNORMAL HIGH (ref 80.0–100.0)
Monocytes Absolute: 2.8 K/uL — ABNORMAL HIGH (ref 0.1–1.0)
Monocytes Relative: 8 %
Neutro Abs: 28.6 K/uL — ABNORMAL HIGH (ref 1.7–7.7)
Neutrophils Relative %: 84 %
Platelets: 1075 K/uL (ref 150–400)
RBC: 5.16 MIL/uL — ABNORMAL HIGH (ref 3.87–5.11)
RDW: 15.9 % — ABNORMAL HIGH (ref 11.5–15.5)
WBC: 34.4 K/uL — ABNORMAL HIGH (ref 4.0–10.5)
nRBC: 0 % (ref 0.0–0.2)

## 2024-07-26 LAB — RESP PANEL BY RT-PCR (RSV, FLU A&B, COVID)  RVPGX2
Influenza A by PCR: NEGATIVE
Influenza B by PCR: NEGATIVE
Resp Syncytial Virus by PCR: NEGATIVE
SARS Coronavirus 2 by RT PCR: NEGATIVE

## 2024-07-26 LAB — LACTIC ACID, PLASMA
Lactic Acid, Venous: 3.4 mmol/L (ref 0.5–1.9)
Lactic Acid, Venous: 3.8 mmol/L (ref 0.5–1.9)

## 2024-07-26 LAB — ACETAMINOPHEN LEVEL: Acetaminophen (Tylenol), Serum: <10 ug/mL — ABNORMAL LOW (ref 10–30)

## 2024-07-26 LAB — TROPONIN I (HIGH SENSITIVITY)
Troponin I (High Sensitivity): 200 ng/L (ref <18)
Troponin I (High Sensitivity): 322 ng/L (ref <18)

## 2024-07-26 LAB — BLOOD GAS, VENOUS

## 2024-07-26 LAB — SALICYLATE LEVEL: Salicylate Lvl: <7 mg/dL — ABNORMAL LOW (ref 7.0–30.0)

## 2024-07-26 MED ORDER — VANCOMYCIN HCL IN DEXTROSE 1-5 GM/200ML-% IV SOLN: 1000.0000 mg | Freq: Once | INTRAVENOUS | Status: DC

## 2024-07-26 MED ORDER — SODIUM CHLORIDE 0.9 % IV BOLUS
500.0000 mL | Freq: Once | INTRAVENOUS | Status: AC
  Administered 2024-07-26: 500 mL via INTRAVENOUS

## 2024-07-26 MED ORDER — VANCOMYCIN HCL 1250 MG/250ML IV SOLN
1250.0000 mg | Freq: Once | INTRAVENOUS | Status: AC
  Administered 2024-07-27: 1250 mg via INTRAVENOUS
  Filled 2024-07-26: qty 250

## 2024-07-26 MED ORDER — SODIUM CHLORIDE 0.9 % IV SOLN
500.0000 mg | Freq: Once | INTRAVENOUS | Status: AC
  Administered 2024-07-26: 500 mg via INTRAVENOUS
  Filled 2024-07-26: qty 5

## 2024-07-26 MED ORDER — ACETAMINOPHEN 500 MG PO TABS
1000.0000 mg | ORAL_TABLET | Freq: Once | ORAL | Status: AC
Start: 2024-07-26 — End: 2024-07-26
  Administered 2024-07-26: 1000 mg via ORAL
  Filled 2024-07-26: qty 2

## 2024-07-26 MED ORDER — MIDODRINE HCL 5 MG PO TABS: 5.0000 mg | ORAL_TABLET | Freq: Once | ORAL | Status: DC

## 2024-07-26 MED ORDER — SODIUM CHLORIDE 0.9 % IV SOLN
2.0000 g | Freq: Once | INTRAVENOUS | Status: AC
  Administered 2024-07-26: 2 g via INTRAVENOUS
  Filled 2024-07-26: qty 12.5

## 2024-07-26 MED ORDER — MIDODRINE HCL 5 MG PO TABS
5.0000 mg | ORAL_TABLET | Freq: Once | ORAL | Status: DC
  Filled 2024-07-26: qty 1

## 2024-07-26 NOTE — ED Notes (Addendum)
 Transported with this pt to and from CT. Respiratory team at bedside placing pt on 8 lpm high flow nasal cannula.

## 2024-07-26 NOTE — Progress Notes (Signed)
 CODE SEPSIS - PHARMACY COMMUNICATION  **Broad Spectrum Antibiotics should be administered within 1 hour of Sepsis diagnosis**  Time Code Sepsis Called/Page Received: 2144  Antibiotics Ordered: Azithromycin , Cefepime, Vancomycin  Time of 1st antibiotic administration: 2217  Rankin CANDIE Dills, PharmD, [facility] 07/26/2024 9:48 PM

## 2024-07-26 NOTE — Progress Notes (Signed)
 Pt being followed by [facility] for Sepsis protocol.

## 2024-07-26 NOTE — ED Provider Notes (Addendum)
 [facility] Provider Note    Event Date/Time   First MD Initiated Contact with Patient 07/26/24 2005     (approximate)   History   Fatigue   HPI  Cassandra [name] is a 89 y.o. female with ESRD with concerns for recent stroke who comes in with concerns for altered mental status.  I reviewed the note where patient had an acute stroke and sent home with a 21-day monitor.  She also has a history of chronic hypoxic respiratory failure on 2 L as needed, hypertension, dialysis, coronary disease.  Patient reportedly was last known well earlier this morning before dialysis.  When she got back from dialysis she seemed more fatigued than normal but when the husband tried to wake her up they noted that she was more lethargic.  Her blood pressure was 79/38 with EMS.  She was answering all questions for them and following commands although just seemed really fatigued and tired.  Patient did have a low-grade temperature 99.5.  Patient denies any chest pain, shortness of breath     Physical Exam   Triage Vital Signs: ED Triage Vitals  Encounter Vitals Group     BP --      Girls Systolic BP Percentile --      Girls Diastolic BP Percentile --      Boys Systolic BP Percentile --      Boys Diastolic BP Percentile --      Pulse Rate 07/26/24 2009 92     Resp 07/26/24 2009 (!) 22     Temp 07/26/24 2009 99.5 F (37.5 C)     Temp Source 07/26/24 2009 Oral     SpO2 07/26/24 2005 (!) 80 %     Weight 07/26/24 2010 130 lb 1.6 oz (59 kg)     Height 07/26/24 2010 5' 1 (1.549 m)     Head Circumference --      Peak Flow --      Pain Score 07/26/24 2010 0     Pain Loc --      Pain Education --      Exclude from Growth Chart --     Most recent vital signs: Vitals:   07/26/24 2005 07/26/24 2009  Pulse:  92  Resp:  (!) 22  Temp:  99.5 F (37.5 C)  SpO2: (!) 80% (!) 82%     General: Awake, no distress.  CV:  Good peripheral perfusion.  Resp:  Normal effort.  Clear  lungs satting in the 80s Abd:  No distention.  Soft and nontender Other:  No swelling legs.  No calf tenderness Patient is alert and oriented though appears fatigued and slightly confused able to follow commands and does have equal leg strength and equal arm strength.  ED Results / Procedures / Treatments   Labs (all labs ordered are listed, but only abnormal results are displayed) Labs Reviewed  CULTURE, BLOOD (ROUTINE X 2)  CULTURE, BLOOD (ROUTINE X 2)  RESP PANEL BY RT-PCR (RSV, FLU A&B, COVID)  [id number]  CBC WITH DIFFERENTIAL/PLATELET  COMPREHENSIVE METABOLIC PANEL WITH GFR  BLOOD GAS, VENOUS  LACTIC ACID, PLASMA  LACTIC ACID, PLASMA  URINE DRUG SCREEN, QUALITATIVE ([facility] ONLY)  SALICYLATE LEVEL  ACETAMINOPHEN  LEVEL  TROPONIN I (HIGH SENSITIVITY)     EKG  My interpretation of EKG:  Sinus rate of 82 without any ST elevation or T wave inversions except for lead III and V6, normal intervals  RADIOLOGY I have reviewed the xray  personally and interpreted concerning for possible pneumonia   PROCEDURES:  Critical Care performed: Yes, see critical care procedure note(s)  .1-3 Lead EKG Interpretation  Performed by: Ernest Ronal BRAVO, MD Authorized by: Ernest Ronal BRAVO, MD     Interpretation: normal     ECG rate:  90   ECG rate assessment: normal     Rhythm: sinus rhythm     Ectopy: none     Conduction: normal   .Critical Care  Performed by: Ernest Ronal BRAVO, MD Authorized by: Ernest Ronal BRAVO, MD   Critical care provider statement:    Critical care time (minutes):  30   Critical care was necessary to treat or prevent imminent or life-threatening deterioration of the following conditions:  Respiratory failure   Critical care was time spent personally by me on the following activities:  Development of treatment plan with patient or surrogate, discussions with consultants, evaluation of patient's response to treatment, examination of patient, ordering and review of laboratory studies,  ordering and review of radiographic studies, ordering and performing treatments and interventions, pulse oximetry, re-evaluation of patient's condition and review of old charts    MEDICATIONS ORDERED IN ED: Medications  sodium chloride  0.9 % bolus 500 mL (500 mLs Intravenous New Bag/Given 07/26/24 2028)     IMPRESSION / MDM / ASSESSMENT AND PLAN / ED COURSE  I reviewed the triage vital signs and the nursing notes.   Patient's presentation is most consistent with acute presentation with potential threat to life or bodily function.   Patient comes in with altered mental status satting in the 80s.  I suspect this is related to low blood pressures will give 500 cc of fluid.  If patient also could be altered secondary to hypercapnia so VBG was ordered this was normal.  Patient is hypoxic and is currently on 6 L still with sats of 88 to 89% will discuss with respiratory did place patient on high flow nasal cannula.  Given altered mental status will get CT head just to ensure deficit and cranial hemorrhage given recently on Plavix but I suspect this is more likely related to infection her x-ray shows possible pneumonia will get CT chest abdomen pelvis to ensure no other causes for infection.  CT imaging consistent with pneumonia.  COVID test was negative.  Cmp okay K, vbg no hy0percap.   Will d/w hospital for admission  pt on HFNC  Holding full fluid resusc given ESRD and do NOT want to fluid loverload pt.   Lactate was elevated pending repeat after fluiids.  Troponins are elevated but I would hold off on heparin  given the seems most consistent with demand secondary to dialysis, pneumonia.  Plt count 1075, hemoglobin 16.2 white count 34.4 patient does have a history of chronic leukocytosis but given her elevated platelets suspect patient may require additional workup of this by hematology.  I will get a repeat CBC.   CT scan confirms pneumonia.  She got no right upper quadrant tenderness to  suggest cholecystitis.  She reports just feeling cold.  Patient's blood pressures were a little soft.  She tolerated the first 500 cc of fluid without any respiratory issues will give an additional 500 cc of fluid although maps are improving down to 64.  Will also give a small dose of midodrine to help with blood pressures.  Once blood pressures are better stabilized we will discuss the hospital team for admission  10:52 PM patient's maps are fluctuating a lot.  After the fluids  her maps were over 65 but now around 60 again.  Will give a dose of midodrine and repeat the lactate to see if the lactate is improving and midodrine helps keep maps above 65 I think patient would be hospitalist appropriate but if lactate is worsening or maps are staying consistently less than 60 then I think patient would need to be started on pressors and admitted to the ICU.  Patient will be handed off pending this.  11:11 PM Patient tolerated 1 L without any issues her blood pressures do go up when getting the fluids but then can go back down with a fluid stop we will do an additional 500 cc given no changes in respiratory status and she denies any worsening shortness of breath.  Patient reports feeling improved.  Will give a dose of midodrine.  Patient handed off pending repeat blood pressure but if lactate is rising and blood pressures are not improving then patient will require pressors.  The patient is on the cardiac monitor to evaluate for evidence of arrhythmia and/or significant heart rate changes.  Clinical Course as of 07/26/24 2310  Sat Jul 26, 2024  2256 Received signout out: on 8L with pneumonia. Sepsis, Dialysis patient. Issue is fluctuating pressures. Full code.  [HD]    Clinical Course User Index [HD] Nicholaus Rolland BRAVO, MD     FINAL CLINICAL IMPRESSION(S) / ED DIAGNOSES   Final diagnoses:  Sepsis, due to unspecified organism, unspecified whether acute organ dysfunction present (HCC)  Pneumonia due to  infectious organism, unspecified laterality, unspecified part of lung  ESRD (end stage renal disease) on dialysis [facility])     Rx / DC Orders   ED Discharge Orders     None        Note:  This document was prepared using Dragon voice recognition software and may include unintentional dictation errors.      Ernest Ronal BRAVO, MD 07/26/24 [phone]

## 2024-07-26 NOTE — ED Triage Notes (Signed)
 Pt arrives from home via [facility].  C/O lethargy.  Pt had dialysis this morning, states dialysis 3x weekly. Pt denies pain, SHOB, N/V. With EMS O2 sats 78-80 on RA and BP initially WNL then 79/38.

## 2024-07-27 ENCOUNTER — Inpatient Hospital Stay: Admit: 2024-07-27 | Discharge: 2024-07-27 | Disposition: A | Attending: Family Medicine

## 2024-07-27 DIAGNOSIS — A419 Sepsis, unspecified organism: Secondary | ICD-10-CM | POA: Diagnosis not present

## 2024-07-27 DIAGNOSIS — I214 Non-ST elevation (NSTEMI) myocardial infarction: Secondary | ICD-10-CM | POA: Diagnosis not present

## 2024-07-27 DIAGNOSIS — N186 End stage renal disease: Secondary | ICD-10-CM | POA: Diagnosis not present

## 2024-07-27 DIAGNOSIS — I10 Essential (primary) hypertension: Secondary | ICD-10-CM | POA: Insufficient documentation

## 2024-07-27 DIAGNOSIS — F32A Depression, unspecified: Secondary | ICD-10-CM | POA: Insufficient documentation

## 2024-07-27 DIAGNOSIS — E785 Hyperlipidemia, unspecified: Secondary | ICD-10-CM | POA: Insufficient documentation

## 2024-07-27 DIAGNOSIS — Z992 Dependence on renal dialysis: Secondary | ICD-10-CM | POA: Diagnosis not present

## 2024-07-27 DIAGNOSIS — J189 Pneumonia, unspecified organism: Secondary | ICD-10-CM | POA: Diagnosis not present

## 2024-07-27 DIAGNOSIS — R0602 Shortness of breath: Secondary | ICD-10-CM | POA: Diagnosis not present

## 2024-07-27 DIAGNOSIS — E039 Hypothyroidism, unspecified: Secondary | ICD-10-CM | POA: Insufficient documentation

## 2024-07-27 DIAGNOSIS — R7989 Other specified abnormal findings of blood chemistry: Secondary | ICD-10-CM | POA: Diagnosis not present

## 2024-07-27 LAB — BASIC METABOLIC PANEL WITH GFR
Anion gap: 17 — ABNORMAL HIGH (ref 5–15)
BUN: 28 mg/dL — ABNORMAL HIGH (ref 8–23)
CO2: 22 mmol/L (ref 22–32)
Calcium: 7.3 mg/dL — ABNORMAL LOW (ref 8.9–10.3)
Chloride: 97 mmol/L — ABNORMAL LOW (ref 98–111)
Creatinine, Ser: 5.6 mg/dL — ABNORMAL HIGH (ref 0.44–1.00)
GFR, Estimated: 7 mL/min — ABNORMAL LOW (ref >60)
Glucose, Bld: 65 mg/dL — ABNORMAL LOW (ref 70–99)
Potassium: 4.6 mmol/L (ref 3.5–5.1)
Sodium: 136 mmol/L (ref 135–145)

## 2024-07-27 LAB — CBC
HCT: 45.3 % (ref 36.0–46.0)
Hemoglobin: 14.1 g/dL (ref 12.0–15.0)
MCH: 32 pg (ref 26.0–34.0)
MCHC: 31.1 g/dL (ref 30.0–36.0)
MCV: 102.7 fL — ABNORMAL HIGH (ref 80.0–100.0)
Platelets: 971 K/uL (ref 150–400)
RBC: 4.41 MIL/uL (ref 3.87–5.11)
RDW: 15.9 % — ABNORMAL HIGH (ref 11.5–15.5)
WBC: 30.3 K/uL — ABNORMAL HIGH (ref 4.0–10.5)
nRBC: 0 % (ref 0.0–0.2)

## 2024-07-27 LAB — ECHOCARDIOGRAM COMPLETE
AR max vel: 1.63 cm2
AV Area VTI: 1.89 cm2
AV Area mean vel: 1.78 cm2
AV Mean grad: 9 mmHg
AV Peak grad: 16.6 mmHg
Ao pk vel: 2.04 m/s
Area-P 1/2: 3.31 cm2
Calc EF: 77.8 %
Height: 61 in
P 1/2 time: 474 ms
S' Lateral: 2.7 cm
Single Plane A2C EF: 81.5 %
Single Plane A4C EF: 72.6 %
Weight: 2081.6 [oz_av]

## 2024-07-27 LAB — PROTIME-INR
INR: 1.4 — ABNORMAL HIGH (ref 0.8–1.2)
Prothrombin Time: 17.7 s — ABNORMAL HIGH (ref 11.4–15.2)

## 2024-07-27 LAB — CBC WITH DIFFERENTIAL/PLATELET
Abs Immature Granulocytes: 1.9 K/uL — ABNORMAL HIGH (ref 0.00–0.07)
Basophils Absolute: 0.2 K/uL — ABNORMAL HIGH (ref 0.0–0.1)
Basophils Relative: 1 %
Eosinophils Absolute: 0.1 K/uL (ref 0.0–0.5)
Eosinophils Relative: 0 %
HCT: 45.1 % (ref 36.0–46.0)
Hemoglobin: 13.9 g/dL (ref 12.0–15.0)
Immature Granulocytes: 5 %
Lymphocytes Relative: 5 %
Lymphs Abs: 1.7 K/uL (ref 0.7–4.0)
MCH: 31.5 pg (ref 26.0–34.0)
MCHC: 30.8 g/dL (ref 30.0–36.0)
MCV: 102.3 fL — ABNORMAL HIGH (ref 80.0–100.0)
Monocytes Absolute: 3.1 K/uL — ABNORMAL HIGH (ref 0.1–1.0)
Monocytes Relative: 8 %
Neutro Abs: 30.4 K/uL — ABNORMAL HIGH (ref 1.7–7.7)
Neutrophils Relative %: 81 %
Platelets: 1090 K/uL (ref 150–400)
RBC: 4.41 MIL/uL (ref 3.87–5.11)
RDW: 16.3 % — ABNORMAL HIGH (ref 11.5–15.5)
WBC: 37.3 K/uL — ABNORMAL HIGH (ref 4.0–10.5)
nRBC: 0 % (ref 0.0–0.2)

## 2024-07-27 LAB — MRSA NEXT GEN BY PCR, NASAL: MRSA by PCR Next Gen: NOT DETECTED

## 2024-07-27 LAB — HEPARIN LEVEL (UNFRACTIONATED): Heparin Unfractionated: 0.41 [IU]/mL (ref 0.30–0.70)

## 2024-07-27 LAB — LACTIC ACID, PLASMA
Lactic Acid, Venous: 2.5 mmol/L (ref 0.5–1.9)
Lactic Acid, Venous: 3.4 mmol/L (ref 0.5–1.9)
Lactic Acid, Venous: 3.8 mmol/L (ref 0.5–1.9)
Lactic Acid, Venous: 4.3 mmol/L (ref 0.5–1.9)

## 2024-07-27 LAB — CORTISOL-AM, BLOOD: Cortisol - AM: 18 ug/dL (ref 6.7–22.6)

## 2024-07-27 MED ORDER — LACTATED RINGERS IV SOLN: 150.0000 mL/h | INTRAVENOUS | Status: DC

## 2024-07-27 MED ORDER — GUAIFENESIN ER 600 MG PO TB12
600.0000 mg | ORAL_TABLET | Freq: Two times a day (BID) | ORAL | Status: DC
  Administered 2024-07-28 – 2024-08-06 (×16): 600 mg via ORAL
  Filled 2024-07-27 (×19): qty 1

## 2024-07-27 MED ORDER — ROPINIROLE HCL 1 MG PO TABS
0.5000 mg | ORAL_TABLET | Freq: Two times a day (BID) | ORAL | Status: DC
  Administered 2024-07-27 – 2024-07-29 (×4): 0.5 mg via ORAL
  Filled 2024-07-27 (×4): qty 1

## 2024-07-27 MED ORDER — ONDANSETRON HCL 4 MG/2ML IJ SOLN
4.0000 mg | Freq: Four times a day (QID) | INTRAMUSCULAR | Status: DC | PRN
  Administered 2024-08-03 – 2024-08-06 (×2): 4 mg via INTRAVENOUS
  Filled 2024-07-27 (×2): qty 2

## 2024-07-27 MED ORDER — HEPARIN SODIUM (PORCINE) 5000 UNIT/ML IJ SOLN: 5000.0000 [IU] | Freq: Two times a day (BID) | INTRAMUSCULAR | Status: DC

## 2024-07-27 MED ORDER — SODIUM CHLORIDE 0.9 % IV SOLN
500.0000 mg | INTRAVENOUS | Status: AC
  Administered 2024-07-27 – 2024-07-30 (×4): 500 mg via INTRAVENOUS
  Filled 2024-07-27 (×4): qty 5

## 2024-07-27 MED ORDER — SODIUM CHLORIDE 0.9 % IV SOLN: 2.0000 g | INTRAVENOUS | Status: DC

## 2024-07-27 MED ORDER — IPRATROPIUM-ALBUTEROL 0.5-2.5 (3) MG/3ML IN SOLN
3.0000 mL | Freq: Four times a day (QID) | RESPIRATORY_TRACT | Status: DC
  Administered 2024-07-27 – 2024-07-28 (×2): 3 mL via RESPIRATORY_TRACT
  Filled 2024-07-27 (×2): qty 3

## 2024-07-27 MED ORDER — CLOPIDOGREL BISULFATE 75 MG PO TABS
75.0000 mg | ORAL_TABLET | Freq: Every day | ORAL | Status: DC
  Administered 2024-07-27 – 2024-08-06 (×11): 75 mg via ORAL
  Filled 2024-07-27 (×11): qty 1

## 2024-07-27 MED ORDER — CLONIDINE HCL 0.1 MG PO TABS: 0.1000 mg | ORAL_TABLET | Freq: Two times a day (BID) | ORAL | Status: DC

## 2024-07-27 MED ORDER — ONDANSETRON HCL 4 MG PO TABS: 4.0000 mg | ORAL_TABLET | Freq: Four times a day (QID) | ORAL | Status: DC | PRN

## 2024-07-27 MED ORDER — GABAPENTIN 100 MG PO CAPS
100.0000 mg | ORAL_CAPSULE | Freq: Two times a day (BID) | ORAL | Status: DC
  Administered 2024-07-27 – 2024-07-28 (×3): 100 mg via ORAL
  Filled 2024-07-27 (×4): qty 1

## 2024-07-27 MED ORDER — ROSUVASTATIN CALCIUM 10 MG PO TABS
10.0000 mg | ORAL_TABLET | Freq: Every day | ORAL | Status: DC
  Administered 2024-07-27 – 2024-08-06 (×11): 10 mg via ORAL
  Filled 2024-07-27 (×11): qty 1

## 2024-07-27 MED ORDER — ALPRAZOLAM 0.25 MG PO TABS
0.2500 mg | ORAL_TABLET | Freq: Every day | ORAL | Status: DC | PRN
  Administered 2024-07-27: 0.25 mg via ORAL
  Filled 2024-07-27: qty 1

## 2024-07-27 MED ORDER — LEVOTHYROXINE SODIUM 88 MCG PO TABS
88.0000 ug | ORAL_TABLET | Freq: Every day | ORAL | Status: DC
  Administered 2024-07-27 – 2024-08-06 (×11): 88 ug via ORAL
  Filled 2024-07-27 (×11): qty 1

## 2024-07-27 MED ORDER — ACETAMINOPHEN 325 MG PO TABS
650.0000 mg | ORAL_TABLET | Freq: Four times a day (QID) | ORAL | Status: DC | PRN
  Administered 2024-08-04 – 2024-08-05 (×5): 650 mg via ORAL
  Filled 2024-07-27 (×5): qty 2

## 2024-07-27 MED ORDER — HYDROCODONE-ACETAMINOPHEN 5-325 MG PO TABS
1.0000 | ORAL_TABLET | Freq: Four times a day (QID) | ORAL | Status: DC | PRN
  Administered 2024-07-27 – 2024-07-28 (×3): 1 via ORAL
  Filled 2024-07-27 (×3): qty 1

## 2024-07-27 MED ORDER — GABAPENTIN 100 MG PO CAPS
200.0000 mg | ORAL_CAPSULE | Freq: Every day | ORAL | Status: DC
  Administered 2024-07-28: 200 mg via ORAL
  Filled 2024-07-27: qty 2

## 2024-07-27 MED ORDER — FUROSEMIDE 10 MG/ML IJ SOLN
20.0000 mg | Freq: Once | INTRAMUSCULAR | Status: AC
  Administered 2024-07-27: 20 mg via INTRAVENOUS
  Filled 2024-07-27: qty 2

## 2024-07-27 MED ORDER — TRAZODONE HCL 50 MG PO TABS
25.0000 mg | ORAL_TABLET | Freq: Every day | ORAL | Status: DC
  Administered 2024-07-28: 50 mg via ORAL
  Filled 2024-07-27: qty 1

## 2024-07-27 MED ORDER — HYDROXYUREA 300 MG PO CAPS
300.0000 mg | ORAL_CAPSULE | Freq: Every day | ORAL | Status: DC
  Administered 2024-07-27 – 2024-07-29 (×3): 300 mg via ORAL
  Filled 2024-07-27 (×3): qty 1

## 2024-07-27 MED ORDER — ASPIRIN 81 MG PO TBEC
81.0000 mg | DELAYED_RELEASE_TABLET | Freq: Every day | ORAL | Status: DC
  Administered 2024-07-27 – 2024-08-06 (×11): 81 mg via ORAL
  Filled 2024-07-27 (×11): qty 1

## 2024-07-27 MED ORDER — MAGNESIUM HYDROXIDE 400 MG/5ML PO SUSP: 30.0000 mL | Freq: Every day | ORAL | Status: DC | PRN

## 2024-07-27 MED ORDER — HEPARIN BOLUS VIA INFUSION
3500.0000 [IU] | Freq: Once | INTRAVENOUS | Status: AC
Start: 2024-07-27 — End: 2024-07-27
  Administered 2024-07-27: 3500 [IU] via INTRAVENOUS
  Filled 2024-07-27: qty 3500

## 2024-07-27 MED ORDER — LACTATED RINGERS IV SOLN
150.0000 mL/h | INTRAVENOUS | Status: DC
  Administered 2024-07-27: 150 mL/h via INTRAVENOUS

## 2024-07-27 MED ORDER — NEPRO/CARBSTEADY PO LIQD
237.0000 mL | Freq: Two times a day (BID) | ORAL | Status: DC
  Administered 2024-07-28 – 2024-08-04 (×9): 237 mL via ORAL

## 2024-07-27 MED ORDER — GABAPENTIN 100 MG PO CAPS: 100.0000 mg | ORAL_CAPSULE | Freq: Three times a day (TID) | ORAL | Status: DC

## 2024-07-27 MED ORDER — PIPERACILLIN-TAZOBACTAM IN DEX 2-0.25 GM/50ML IV SOLN
2.2500 g | INTRAVENOUS | Status: DC
  Administered 2024-07-27 – 2024-07-29 (×3): 2.25 g via INTRAVENOUS
  Filled 2024-07-27 (×3): qty 50

## 2024-07-27 MED ORDER — SERTRALINE HCL 50 MG PO TABS
50.0000 mg | ORAL_TABLET | Freq: Every day | ORAL | Status: DC
  Administered 2024-07-27 – 2024-08-06 (×11): 50 mg via ORAL
  Filled 2024-07-27 (×11): qty 1

## 2024-07-27 MED ORDER — HYDROXYZINE HCL 10 MG PO TABS
10.0000 mg | ORAL_TABLET | Freq: Every day | ORAL | Status: DC | PRN
Start: 2024-07-27 — End: 2024-07-29

## 2024-07-27 MED ORDER — HEPARIN (PORCINE) 25000 UT/250ML-% IV SOLN
750.0000 [IU]/h | INTRAVENOUS | Status: AC
  Administered 2024-07-27 – 2024-07-28 (×2): 750 [IU]/h via INTRAVENOUS
  Filled 2024-07-27 (×2): qty 250

## 2024-07-27 MED ORDER — ENOXAPARIN SODIUM 40 MG/0.4ML IJ SOSY: 40.0000 mg | PREFILLED_SYRINGE | INTRAMUSCULAR | Status: DC

## 2024-07-27 MED ORDER — HYDROCORTISONE SOD SUC (PF) 100 MG IJ SOLR
100.0000 mg | Freq: Two times a day (BID) | INTRAMUSCULAR | Status: DC
  Administered 2024-07-27 – 2024-07-29 (×5): 100 mg via INTRAVENOUS
  Filled 2024-07-27 (×6): qty 2

## 2024-07-27 MED ORDER — DIPHENHYDRAMINE HCL 25 MG PO CAPS
25.0000 mg | ORAL_CAPSULE | Freq: Every evening | ORAL | Status: DC | PRN
  Administered 2024-07-27: 25 mg via ORAL
  Filled 2024-07-27: qty 1

## 2024-07-27 MED ORDER — MIDODRINE HCL 5 MG PO TABS
10.0000 mg | ORAL_TABLET | Freq: Three times a day (TID) | ORAL | Status: DC
  Administered 2024-07-27: 10 mg via ORAL
  Filled 2024-07-27: qty 2

## 2024-07-27 MED ORDER — CALCIUM ACETATE (PHOS BINDER) 667 MG PO CAPS
1334.0000 mg | ORAL_CAPSULE | Freq: Three times a day (TID) | ORAL | Status: DC
  Administered 2024-07-27 – 2024-08-06 (×22): 1334 mg via ORAL
  Filled 2024-07-27 (×24): qty 2

## 2024-07-27 MED ORDER — AMLODIPINE BESYLATE 5 MG PO TABS: 5.0000 mg | ORAL_TABLET | Freq: Every day | ORAL | Status: DC

## 2024-07-27 MED ORDER — ACETAMINOPHEN 650 MG RE SUPP: 650.0000 mg | Freq: Four times a day (QID) | RECTAL | Status: DC | PRN

## 2024-07-27 MED ORDER — MIDODRINE HCL 5 MG PO TABS: 5.0000 mg | ORAL_TABLET | Freq: Three times a day (TID) | ORAL | Status: DC

## 2024-07-27 NOTE — Assessment & Plan Note (Addendum)
-   She is not having any current chest pain though she is diabetic. - Will place on IV heparin  per protocol for now. - Differential diagnosis would include demand ischemia. - Will follow serial troponins. - Will continue aspirin  and Plavix. - The patient will be on high-dose statin therapy. - 2D echo be obtained as well as cardiology consult. - Notified Dr. Fernand about the patient

## 2024-07-27 NOTE — Progress Notes (Signed)
 ANTICOAGULATION CONSULT NOTE  Pharmacy Consult for heparin  infusion Indication: ACS/STEMI  Allergies  Allergen Reactions   Atorvastatin Other (See Comments)    MYALGIA   Cyclobenzaprine Other (See Comments)    hallucination   Mirtazapine     Other reaction(s): Hallucination   Oxycodone-Acetaminophen  Other (See Comments)    hallucination   Paroxetine Hcl Other (See Comments)    hallucination   Penicillins Swelling    Lip and orbital swelling Tolerated 3rd generation cephalosporin (CEFTRIAXONE ) between 03/05/2023 and 03/09/2023 with no documented ADRs.   Propoxyphene Other (See Comments)    hallucination   Trazodone  Other (See Comments)    hallucination    Patient Measurements: Height: 5' 1 (154.9 cm) Weight: 59 kg (130 lb 1.6 oz) IBW/kg (Calculated) : 47.8 HEPARIN  DW (KG): 59  Vital Signs: Temp: 99.4 F (37.4 C) (11/02 0033) Temp Source: Oral (11/02 0033) BP: 117/55 (11/02 0033) Pulse Rate: 91 (11/02 0033)  Labs: Recent Labs    07/26/24 2015 07/26/24 2206 07/26/24 2330  HGB 16.2*  --  13.9  HCT 52.2*  --  45.1  PLT 1,075*  --  1,090*  CREATININE 5.46*  --   --   TROPONINIHS 200* 322*  --     Estimated Creatinine Clearance: 5.2 mL/min (A) (by C-G formula based on SCr of 5.46 mg/dL (H)).   Medical History: Past Medical History:  Diagnosis Date   Anxiety    Aortic atherosclerosis    Arthritis    osteoarthritis in hands, knees ( R torn cartilage, L meniscus removed)    Atherosclerosis of native coronary artery of native heart with unstable angina pectoris (HCC)    CAD (coronary artery disease) 1999   a.) s/p PCI (details unkown) in [place]  in 1999   Campylobacter antigen positive 11/29/2014   a.) presented to [facility] ED with N/V/D; stool culture (+)   ESRD on hemodialysis (HCC)    a.) T-Th-Sat   Glaucoma    Hypercholesterolemia    Hyperlipidemia    Hypertension    Hypothyroidism    Lumbar stenosis with neurogenic claudication    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  Goal of Therapy:  Heparin  level 0.3-0.7 units/ml Monitor platelets by anticoagulation protocol: Yes   Plan:  Bolus 3500 units x 1 Start heparin  infusion at 750 units/hr Will check HL in 8 hr after start of infusion CBC daily while on heparin   Rankin CANDIE Dills, PharmD, [facility] 07/27/2024 3:15 AM

## 2024-07-27 NOTE — Assessment & Plan Note (Signed)
-   The patient will be admitted to a medical telemetry bed. - Will continue antibiotic therapy with IV Rocephin  and Zithromax . - Mucolytic therapy be provided as well as duo nebs q.i.d. and q.4 hours p.r.n. - We will follow blood cultures. - Will continue hydration with IV lactated ringer .

## 2024-07-27 NOTE — Progress Notes (Signed)
 The IV Nurse rec'd a consult for pt needing 2 new IV sites; pt limited to R arm only for labs, ivs; apparently pt had 2 sites but they came out;  able to place 1 new site with ultrasound; no other suitable veins noted for a 2nd IV site. RN aware;  Suggest taking BP in pt's calf rather than on her R arm as pt is so very limited with access.

## 2024-07-27 NOTE — Assessment & Plan Note (Signed)
 Continue Zoloft 

## 2024-07-27 NOTE — Progress Notes (Signed)
 ANTICOAGULATION CONSULT NOTE  Pharmacy Consult for heparin  infusion Indication: ACS/STEMI  Allergies  Allergen Reactions   Atorvastatin Other (See Comments)    MYALGIA   Cyclobenzaprine Other (See Comments)    hallucination   Mirtazapine     Other reaction(s): Hallucination   Oxycodone-Acetaminophen  Other (See Comments)    hallucination   Paroxetine Hcl Other (See Comments)    hallucination   Penicillins Swelling    Lip and orbital swelling Tolerated 3rd generation cephalosporin (CEFTRIAXONE ) between 03/05/2023 and 03/09/2023 with no documented ADRs.   Propoxyphene Other (See Comments)    hallucination   Trazodone  Other (See Comments)    hallucination    Patient Measurements: Height: 5' 1 (154.9 cm) Weight: 59 kg (130 lb 1.6 oz) IBW/kg (Calculated) : 47.8 HEPARIN  DW (KG): 59  Vital Signs: Temp: 98.4 F (36.9 C) (11/02 1524) Temp Source: Oral (11/02 1153) BP: 108/81 (11/02 1524) Pulse Rate: 71 (11/02 1305)  Labs: Recent Labs    07/26/24 2015 07/26/24 2206 07/26/24 2330 07/27/24 0329 07/27/24 0330 07/27/24 1547  HGB 16.2*  --  13.9  --  14.1  --   HCT 52.2*  --  45.1  --  45.3  --   PLT 1,075*  --  1,090*  --  971*  --   LABPROT  --   --   --  17.7*  --   --   INR  --   --   --  1.4*  --   --   HEPARINUNFRC  --   --   --   --   --  0.41  CREATININE 5.46*  --   --   --  5.60*  --   TROPONINIHS 200* 322*  --   --   --   --     Estimated Creatinine Clearance: 5.1 mL/min (A) (by C-G formula based on SCr of 5.6 mg/dL (H)).   Medical History: Past Medical History:  Diagnosis Date   Anxiety    Aortic atherosclerosis    Arthritis    osteoarthritis in hands, knees ( R torn cartilage, L meniscus removed)    Atherosclerosis of native coronary artery of native heart with unstable angina pectoris (HCC)    CAD (coronary artery disease) 1999   a.) s/p PCI (details unkown) in [place]  in 1999   Campylobacter antigen positive 11/29/2014   a.) presented to [facility]  ED with N/V/D; stool culture (+)   ESRD on hemodialysis (HCC)    a.) T-Th-Sat   Glaucoma    Hypercholesterolemia    Hyperlipidemia    Hypertension    Hypothyroidism    Lumbar stenosis with neurogenic claudication    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  Goal of Therapy:  Heparin  level 0.3-0.7 units/ml Monitor platelets by anticoagulation protocol: Yes  Date Time HL Rate/Comment  11/3 1547 0.41 Therapeutic x1   Plan:  HL @ 1547 is therapeutic Will continue heparin  infusion at 750 units/hr Will check HL in 8 hr  CBC daily while on heparin   Cassandra [name], PharmD Clinical Pharmacist 07/27/2024 4:39 PM

## 2024-07-27 NOTE — Assessment & Plan Note (Signed)
 Will continue statin therapy

## 2024-07-27 NOTE — Plan of Care (Signed)
  Problem: Clinical Measurements: Goal: Diagnostic test results will improve Outcome: Progressing   Problem: Respiratory: Goal: Ability to maintain adequate ventilation will improve Outcome: Progressing   Problem: Fluid Volume: Goal: Hemodynamic stability will improve Outcome: Progressing   Problem: Skin Integrity: Goal: Risk for impaired skin integrity will decrease Outcome: Progressing

## 2024-07-27 NOTE — H&P (Addendum)
 [place]   PATIENT NAME: Joann Jorge    MR#:  [id number]  DATE OF BIRTH:  01/28/1927  DATE OF ADMISSION:  07/26/2024  PRIMARY CARE PHYSICIAN: Cleotilde Oneil FALCON, MD   Patient is coming from: Home  REQUESTING/REFERRING PHYSICIAN: Nicholaus Maize, MD  CHIEF COMPLAINT:   Chief Complaint  Patient presents with   Fatigue    HISTORY OF PRESENT ILLNESS:  NOLAN LASSER is a 89 y.o. female with medical history significant for coronary artery disease, end-stage renal disease on hemodialysis on TTS, hypertension, dyslipidemia, hypothyroidism, lumbar stenosis, RLS and type 2 diabetes mellitus, who presented to the emergency room with acute onset of fatigue and generalized weakness with altered mental status and lethargy.  Blood pressure was 79/38 with EMS.  Patient admitted to cough productive of whitish sputum.  No chest pain or palpitations..  She admitted to chest pain yesterday though.  She had low-grade fever of 99.5.  No dysuria, urinary frequency or urgency, hematuria or flank pain.  No diarrhea or melena or bright red bleeding per rectum.  No other bleeding diathesis.  ED Course: When the patient came to the ER, BP was 87/43 with respiratory to 22 and temperature 99.5 with pulse oximetry of 82% on room air and 89% on 6 L O2 by nasal cannula.  When the patient was placed on 100% nonrebreather Pulsoxymeter is 91% on 15 L of O2.  100% on high flow nasal cannula at 8 L/min.  Labs revealed lactic acid 3.4, 3.8 and later the same.  Tylenol  level was less than 10 and salicylate less than 7.  CBC showed significant leukocytosis of 34.4 with neutrophilia, platelets 1075 and hemoconcentration.  CMP revealed a creatinine 5.46 with calcium  of 8.1 and chloride of 91 with albumin  of 3.  High sensitive troponin I was 200 and later 322 EKG as reviewed by me : EKG showed likely sinus arrhythmia with a rate of 89 with LVH and Q waves anteriorly. Repeat EKG showed sinus rhythm with a rate of 82 with  right atrial enlargement, LVH and anterior Q waves. Imaging: Portable chest x-ray showed the following: 1. Dense left mid to lower lung airspace disease most consistent with lingular pneumonia, with a small likely parapneumonic effusion. Follow-up study recommended to ensure clearing. 2. New mildly displaced posterolateral fractures of the right 6th and 7th ribs, age indeterminate but possibly subacute.  The patient was given 1.5 L bolus of IV normal saline, 1 g p.o. Tylenol  and 2 g of IV cefepime as well as 500 mg of IV Zithromax .  She will be admitted to a progressive unit bed for further evaluation and management. PAST MEDICAL HISTORY:   Past Medical History:  Diagnosis Date   Anxiety    Aortic atherosclerosis    Arthritis    osteoarthritis in hands, knees ( R torn cartilage, L meniscus removed)    Atherosclerosis of native coronary artery of native heart with unstable angina pectoris (HCC)    CAD (coronary artery disease) 1999   a.) s/p PCI (details unkown) in [place]  in 1999   Campylobacter antigen positive 11/29/2014   a.) [facility] ED with N/V/D; stool culture (+)   ESRD on hemodialysis (HCC)    a.) T-Th-Sat   Glaucoma    Hypercholesterolemia    Hyperlipidemia    Hypertension    Hypothyroidism    Lumbar stenosis with neurogenic claudication    Perforation of colon as colonoscopy complication (HCC) 2010   a.)  required short term colostomy with subsequent take down/reversal   RLS (restless legs syndrome)    Thrombocytosis    Type 2 diabetes mellitus with chronic kidney disease (HCC)     PAST SURGICAL HISTORY:   Past Surgical History:  Procedure Laterality Date   A/V FISTULAGRAM Left 12/25/2023   Procedure: A/V Fistulagram;  Surgeon: Jama Cordella MATSU, MD;  Location: [facility] INVASIVE CV LAB;  Service: Cardiovascular;  Laterality: Left;   ABDOMINAL HYSTERECTOMY     left ovary removed   AV FISTULA PLACEMENT Left 05/25/2023   Procedure: ARTERIOVENOUS (AV) FISTULA  CREATION (BRACHIALCEPHALIC);  Surgeon: Jama Cordella MATSU, MD;  Location: [facility] ORS;  Service: Vascular;  Laterality: Left;   BLEPHAROPLASTY Bilateral    CATARACT EXTRACTION W/ INTRAOCULAR LENS  IMPLANT, BILATERAL     COLONOSCOPY  2010   COLOSTOMY  03/2009   perforated colon from colonoscopy   COLOSTOMY REVERSAL  08/2009   CORONARY ANGIOPLASTY WITH STENT PLACEMENT  1999   DIALYSIS/PERMA CATHETER INSERTION N/A 03/08/2023   Procedure: DIALYSIS/PERMA CATHETER INSERTION;  Surgeon: Marea Selinda RAMAN, MD;  Location: [facility] INV[facility] CV [facility];  Service: Cardiovascular;  Laterality: N/A;   DIALYSIS/PERMA CATHETER REMOVAL N/A 09/05/2023   Procedure: DIALYSIS/PERMA CATHETER REMOVAL;  Surgeon: Marea Selinda RAMAN, MD;  Location: [facility] INV[facility] CV [facility];  Service: Cardiovascular;  Laterality: N/A;   FOOT SURGERY Bilateral    toes correction   KNEE ARTHROSCOPY Right    torn cartilage   KNEE ARTHROSCOPY W/ MENISCECTOMY Left 01/2012   TEMPORARY DIALYSIS CATHETER N/A 03/05/2023   Procedure: TEMPORARY DIALYSIS CATHETER;  Surgeon: Marea Selinda RAMAN, MD;  Location: [facility] INVASIVE CV LAB;  Service: Cardiovascular;  Laterality: N/A;    SOCIAL HISTORY:   Social History   Tobacco Use   Smoking status: Former    Current packs/day: 0.00    Types: Cigarettes    Quit date: 01/21/1980    Years since quitting: 44.5   Smokeless tobacco: Never  Substance Use Topics   Alcohol use: Yes    Alcohol/week: 0.0 standard drinks of alcohol    Comment: occasionally    FAMILY HISTORY:   Family History  Problem Relation Age of Onset   Hypertension Mother    Hypertension Father    Early death Father    Heart disease Father     DRUG ALLERGIES:   Allergies  Allergen Reactions   Atorvastatin Other (See Comments)    MYALGIA   Cyclobenzaprine Other (See Comments)    hallucination   Mirtazapine     Other reaction(s): Hallucination   Oxycodone-Acetaminophen  Other (See Comments)    hallucination   Paroxetine Hcl Other (See Comments)     hallucination   Penicillins Swelling    Lip and orbital swelling Tolerated 3rd generation cephalosporin (CEFTRIAXONE ) between 03/05/2023 and 03/09/2023 with no documented ADRs.   Propoxyphene Other (See Comments)    hallucination   Trazodone  Other (See Comments)    hallucination    REVIEW OF SYSTEMS:   ROS As per history of present illness. All pertinent systems were reviewed above. Constitutional, HEENT, cardiovascular, respiratory, GI, GU, musculoskeletal, neuro, psychiatric, endocrine, integumentary and hematologic systems were reviewed and are otherwise negative/unremarkable except for positive findings mentioned above in the HPI.   MEDICATIONS AT HOME:   Prior to Admission medications   Medication Sig Start Date End Date Taking? Authorizing Provider  ALPRAZolam  (XANAX ) 0.25 MG tablet Take 1 tablet (0.25 mg total) by mouth daily as needed. 03/12/23  Yes Alexander, Natalie, DO  amLODipine  (NORVASC ) 5  MG tablet Take 5 mg by mouth daily.   Yes [provider]  aspirin  EC 81 MG tablet Take 1 tablet (81 mg total) by mouth daily for 21 days. Swallow whole. 07/18/24 08/08/24 Yes Awanda City, MD  betamethasone valerate (VALISONE) 0.1 % cream Apply 1 Application topically daily.   Yes [provider]  calcium  acetate (PHOSLO ) 667 MG capsule Take 1,334 mg by mouth 3 (three) times daily. 06/30/23  Yes [provider]  cetirizine (ZYRTEC) 10 MG tablet Take 1 tablet by mouth daily as needed for allergies.   Yes [provider]  cloNIDine  (CATAPRES ) 0.1 MG tablet Take 0.1 mg by mouth 2 (two) times daily. 06/25/24  Yes [provider]  clopidogrel (PLAVIX) 75 MG tablet Take 1 tablet (75 mg total) by mouth daily. 07/19/24  Yes Awanda City, MD  CRYODOSE TA AERO Apply 1 Application topically as directed. SPRAY 1 AS DIRECTED FOR USE DURING DIALYSIS TO ACCESS 07/11/24  Yes [provider]  [employer] D3 25 MCG (1000 UT) capsule Take 1,000 Units by mouth daily.  02/21/23  Yes [provider]  DROXIA  300 MG capsule TAKE 1 CAPSULE (300 MG TOTAL) BY MOUTH DAILY. MAY TAKE WITH FOOD TO MINIMIZE GI SIDE EFFECTS. 04/23/20  Yes Dasie Tinnie MATSU, NP  gabapentin  (NEURONTIN ) 100 MG capsule Take 1-2 capsules (100-200 mg total) by mouth 3 (three) times daily. 100 mg am 100 mg afternoon 200 mg bedtime. Home med. 04/10/24  Yes Awanda City, MD  HYDROcodone -acetaminophen  (NORCO/VICODIN) 5-325 MG tablet Take 1 tablet by mouth every 6 (six) hours as needed for moderate pain (pain score 4-6) or severe pain (pain score 7-10).   Yes [provider]  hydrOXYzine (ATARAX) 10 MG tablet Take 10 mg by mouth daily as needed. 07/03/24  Yes [provider]  latanoprost  (XALATAN ) 0.005 % ophthalmic solution Place 1 drop into both eyes at bedtime. 09/13/15  Yes [provider]  levothyroxine  (SYNTHROID ) 88 MCG tablet Take 88 mcg by mouth daily. Take on an empty stomach 30 to 60 minutes before breakfast 12/08/14  Yes [provider]  lidocaine -prilocaine  (EMLA ) cream Apply 1 Application topically as needed. 03/24/24  Yes Gregg Lek, MD  naloxone (NARCAN) nasal spray 4 mg/0.1 mL Place 4 mg into the nose once. 06/23/24  Yes [provider]  rOPINIRole  (REQUIP ) 0.5 MG tablet Take 1 tablet (0.5 mg total) by mouth in the morning and at bedtime. 08/22/23 08/16/24 Yes Camara, Lek, MD  rosuvastatin  (CRESTOR ) 10 MG tablet Take 1 tablet (10 mg total) by mouth daily. Reduced from 20 mg to 10 mg, due to ESRD. 07/18/24  Yes Awanda City, MD  sertraline (ZOLOFT) 50 MG tablet Take 50 mg by mouth daily.   Yes [provider]  timolol  (TIMOPTIC -XR) 0.5 % ophthalmic gel-forming Place 1 drop into both eyes every morning. 05/06/21  Yes [provider]  traZODone  (DESYREL ) 50 MG tablet Take 1 tablet (50 mg total) by mouth at bedtime. Patient taking differently: Take 25-50 mg by mouth at bedtime. 03/24/24  Yes Camara, Lek, MD  glucose blood  test strip Use once daily. Use as instructed. 02/21/17   [provider]  Nutritional Supplements (FEEDING SUPPLEMENT, NEPRO CARB STEADY,) LIQD Take 237 mLs by mouth 2 (two) times daily between meals. 03/12/23   Alexander, Natalie, DO      VITAL SIGNS:  Blood pressure (!) 117/55, pulse 91, temperature 99.4 F (37.4 C), temperature source Oral, resp. rate (!) 21, height 5' 1 (1.549  m), weight 59 kg, SpO2 93%.  PHYSICAL EXAMINATION:  Physical Exam  GENERAL:  89 y.o.-year-old patient lying in the bed with mild respiratory distress with conversational dyspnea.   EYES: Pupils equal, round, reactive to light and accommodation. No scleral icterus. Extraocular muscles intact.  HEENT: Head atraumatic, normocephalic. Oropharynx and nasopharynx clear.  NECK:  Supple, no jugular venous distention. No thyroid enlargement, no tenderness.  LUNGS: Diminished left basal and midlung zone breath sounds with left basal crackles.. No use of accessory muscles of respiration.  CARDIOVASCULAR: Regular rate and rhythm, S1, S2 normal. No murmurs, rubs, or gallops.  ABDOMEN: Soft, nondistended, nontender. Bowel sounds present. No organomegaly or mass.  EXTREMITIES: No pedal edema, cyanosis, or clubbing.  NEUROLOGIC: Cranial nerves II through XII are intact. Muscle strength 5/5 in all extremities. Sensation intact. Gait not checked.  PSYCHIATRIC: The patient is alert and oriented x 3.  Normal affect and good eye contact. SKIN: No obvious rash, lesion, or ulcer.   LABORATORY PANEL:   CBC Recent Labs  Lab 07/26/24 2330  WBC 37.3*  HGB 13.9  HCT 45.1  PLT 1,090*   ------------------------------------------------------------------------------------------------------------------  Chemistries  Recent Labs  Lab 07/26/24 2015  NA 135  K 4.8  CL 91*  CO2 26  GLUCOSE 71  BUN 20  CREATININE 5.46*  CALCIUM  8.1*  AST 40  ALT 18  ALKPHOS 61  BILITOT 1.1    ------------------------------------------------------------------------------------------------------------------  Cardiac Enzymes No results for input(s): TROPONINI in the last 168 hours. ------------------------------------------------------------------------------------------------------------------  RADIOLOGY:  CT CHEST ABDOMEN PELVIS WO CONTRAST Result Date: 07/26/2024 EXAM: CT CHEST, ABDOMEN AND PELVIS WITHOUT CONTRAST 03/05/2023 09:23:51 PM TECHNIQUE: CT of the chest, abdomen and pelvis was performed without the administration of intravenous contrast. Multiplanar reformatted images are provided for review. Automated exposure control, iterative reconstruction, and/or weight based adjustment of the mA/kV was utilized to reduce the radiation dose to as low as reasonably achievable. COMPARISON: None available. CLINICAL HISTORY: Sepsis. FINDINGS: CHEST: MEDIASTINUM AND LYMPH NODES: Mild cardiomegaly. Diffuse coronary artery and aortic atherosclerosis. The central airways are clear. No mediastinal, hilar or axillary lymphadenopathy. LUNGS AND PLEURA: Consolidation in the left lower lobe and lingula and to a lesser extent right lower lobe compatible with pneumonia. No pleural effusion or pneumothorax. ABDOMEN AND PELVIS: LIVER: The liver is unremarkable. GALLBLADDER AND BILE DUCTS: Small layering gallstones within the gallbladder. No evidence of acute cholecystitis. No biliary ductal dilatation. SPLEEN: No acute abnormality. PANCREAS: No acute abnormality. ADRENAL GLANDS: No acute abnormality. KIDNEYS, URETERS AND BLADDER: Atrophic kidneys bilaterally. Left renal mid pole cyst measures 3.5 cm. Per consensus, no follow-up is needed for simple Bosniak type 1 and 2 renal cysts, unless the patient has a malignancy history or risk factors. The urinary bladder is decompressed. No stones in the kidneys or ureters. No hydronephrosis. No perinephric or periureteral stranding. GI AND BOWEL: Stomach demonstrates  no acute abnormality. Postoperative changes in the rectosigmoid colon. Scattered colonic diverticulosis, most pronounced in the left colon. There is no bowel obstruction. REPRODUCTIVE ORGANS: Prior hysterectomy. PERITONEUM AND RETROPERITONEUM: No ascites. No free air. VASCULATURE: Aorta is normal in caliber. ABDOMINAL AND PELVIS LYMPH NODES: No lymphadenopathy. BONES AND SOFT TISSUES: Diffuse degenerative disc and facet disease throughout the thoracolumbar spine. No focal soft tissue abnormality. IMPRESSION: 1. Multifocal consolidation greatest in the left lower lobe and lingula, compatible with pneumonia. 2. Cholelithiasis without acute cholecystitis. 3. Bilateral renal atrophy. 4. Colonic diverticulosis. 5. Aortic atherosclerosis. Coronary artery disease. Electronically signed by: Franky Crease MD 07/26/2024 09:37  PM EDT RP Workstation: [id number]   CT HEAD WO CONTRAST ( ) Result Date: 07/26/2024 EXAM: CT HEAD WITHOUT CONTRAST 07/26/2024 09:23:51 PM TECHNIQUE: CT of the head was performed without the administration of intravenous contrast. Automated exposure control, iterative reconstruction, and/or weight based adjustment of the mA/kV was utilized to reduce the radiation dose to as low as reasonably achievable. COMPARISON: 05/02/2022 CLINICAL HISTORY: Headache, new onset (Age >= 51y) FINDINGS: BRAIN AND VENTRICLES: No acute hemorrhage. No evidence of acute infarct. No hydrocephalus. No extra-axial collection. No mass effect or midline shift. There is diffuse cerebral atrophy and chronic small vessel disease throughout the deep white matter. ORBITS: No acute abnormality. SINUSES: No acute abnormality. SOFT TISSUES AND SKULL: No acute soft tissue abnormality. No skull fracture. IMPRESSION: 1. No acute intracranial abnormality. 2. Diffuse cerebral atrophy and chronic small vessel disease throughout the deep white matter. Electronically signed by: Franky Crease MD 07/26/2024 09:33 PM EDT RP Workstation: [id number]    DG Chest Portable 1 View Result Date: 07/26/2024 EXAM: 1 VIEW(S) XRAY OF THE CHEST 07/26/2024 08:43:00 PM COMPARISON: Portable chest 04/08/2024. CLINICAL HISTORY: Chest pain and lethargy with hypoxemia. Symptom onset following dialysis this morning. FINDINGS: LINES, TUBES AND DEVICES: An electronic device is superimposed over the chest centrally, and there are overlying telemetry leads. LUNGS AND PLEURA: There is dense airspace disease in the left mid to lower lung fields predominating in the periphery, most likely a consolidated pneumonia likely in the lingula as there is obscuration of the lower left heart border. There is a small pleural effusion, most likely a parapneumonic effusion. Rest of the lungs are clear of infiltrates with chronic interstitial changes. No pneumothorax. HEART AND MEDIASTINUM: The aorta is tortuous and calcified, stable mediastinum. There is mild cardiomegaly but no evidence for CHF. BONES AND SOFT TISSUES: Osteopenia. There are new mildly displaced posterolateral fractures of the right 6th and 7th ribs, age indeterminate but probably subacute with suggestion of partial healing. DISCS/DEGENERATIVE CHANGES: Moderate thoracic spondylosis. IMPRESSION: 1. Dense left mid to lower lung airspace disease most consistent with lingular pneumonia, with a small likely parapneumonic effusion. Follow-up study recommended to ensure clearing. 2. New mildly displaced posterolateral fractures of the right 6th and 7th ribs, age indeterminate but possibly subacute. Electronically signed by: Francis Quam MD 07/26/2024 08:53 PM EDT RP Workstation: [id number]      IMPRESSION AND PLAN:  Assessment and Plan: * Sepsis due to pneumonia [facility]) - The patient will be admitted to a medical telemetry bed. - Will continue antibiotic therapy with IV Rocephin  and Zithromax . - Mucolytic therapy be provided as well as duo nebs q.i.d. and q.4 hours p.r.n. - We will follow blood cultures. -Will continue  hydration with IV lactated ringer .  NSTEMI (non-ST elevated myocardial infarction) (HCC) - She is not having any current chest pain though she is diabetic. - Will place on IV heparin  per protocol for now. - Differential diagnosis would include demand ischemia. - Will follow serial troponins. - Will continue aspirin  and Plavix. - The patient will be on high-dose statin therapy. - 2D echo be obtained as well as cardiology consult. - Notified Dr. Fernand about the patient  Depression - Continue Zoloft.  Dyslipidemia - Will continue statin therapy.  Hypothyroidism - We will continue Synthroid .  Essential hypertension - Will continue antihypertensive therapy.    DVT prophylaxis: IV heparin  Advanced Care Planning:  Code Status: The patient is DNR and DNI.  This was discussed with her. Family Communication:  The plan of care was discussed  in details with the patient (and family). I answered all questions. The patient agreed to proceed with the above mentioned plan. Further management will depend upon hospital course. Disposition Plan: Back to previous home environment Consults called: Cardiology All the records are reviewed and case discussed with ED provider.  Status is: Inpatient    At the time of the admission, it appears that the appropriate admission status for this patient is inpatient.  This is judged to be reasonable and necessary in order to provide the required intensity of service to ensure the patient's safety given the presenting symptoms, physical exam findings and initial radiographic and laboratory data in the context of comorbid conditions.  The patient requires inpatient status due to high intensity of service, high risk of further deterioration and high frequency of surveillance required.  I certify that at the time of admission, it is my clinical judgment that the patient will require inpatient hospital care extending more than 2 midnights.                             Dispo: The patient is from: Home              Anticipated d/c is to: Home              Patient currently is not medically stable to d/c.              Difficult to place patient: No  Madison DELENA Peaches M.D on 07/27/2024 at 3:24 AM  [facility]   From 7 PM-7 AM, contact night-coverage [email or web address]  CC: Primary care physician; Cleotilde Oneil FALCON, MD

## 2024-07-27 NOTE — Assessment & Plan Note (Signed)
-   Will continue antihypertensive therapy.

## 2024-07-27 NOTE — Assessment & Plan Note (Signed)
 -  We will continue Synthroid.

## 2024-07-27 NOTE — Consult Note (Signed)
   NAME:  Cassandra [name], MRN:  [id number], DOB:  10/19/1926, LOS: 1 ADMISSION DATE:  07/26/2024, CONSULTATION DATE:  07/27/2024 REFERRING MD:  Leita Blanch MD, CHIEF COMPLAINT:  CAP   History of Present Illness:  Cassandra [name] is a 89 year old female patient with a past medical history of coronary artery disease, end-stage renal disease on HD Tuesday Thursday Saturday, hypertension, dyslipidemia, hypothyroidism, type 2 diabetes mellitus who presented to the emergency department on 07/26/2024 with fatigue and generalized weakness.  She was found to be encephalopathic.  Apparently last known well the morning of her presentation then underwent HD and after HD she was noted to be more lethargic.  EMS were called and and found her to be hypotensive and therefore she was brought in to the emergency department.  She was found to be slightly hypotensive and responded to fluids.  She was also found to be hypoxic and was placed on 10 L nasal cannula.  Labs were notable for an elevated white count of 34.4 with neutrophilic predominance at 85%.  Platelets were elevated at 1000.  Lactic acid of 3.4.  Electrolytes were within normal range.  Creatinine was 5.46 mg/dL but she is a hemodialysis patient.  CT chest on 11/1 with multifocal large consolidation greatest in the left lower lobe and lingula.  She was admitted and started on ceftriaxone  and azithromycin .  Pulmonary team is being consulted for help with further management.  Pertinent  Medical History  As above.  Objective    Blood pressure 108/81, pulse 71, temperature 98.4 F (36.9 C), resp. rate 18, height 5' 1 (1.549 m), weight 59 kg, SpO2 93%.        Intake/Output Summary (Last 24 hours) at 07/27/2024 1556 Last data filed at 07/27/2024 1443 Gross per 24 hour  Intake 3169.37 ml  Output --  Net 3169.37 ml   Filed Weights   07/26/24 2010  Weight: 59 kg    Examination: General: NAD.  HENT: Supple neck, reactive pupils Lungs: Diminished  air entry over the left hemithorax.  Cardiovascular: Normal S1, Normal S2, RRR Abdomen: Soft, non tender, non distended  Extremities: Warm, no edema.   Labs and imaging were reviewed.  Assessment and Plan  Cassandra [name] is a 89 year old female patient with a past medical history of coronary artery disease, end-stage renal disease on HD Tuesday Thursday Saturday, hypertension, dyslipidemia, hypothyroidism, type 2 diabetes mellitus who presented to the emergency department on 07/26/2024 with fatigue and generalized weakness.  She was found to have a large left lower and lingular consolidation.  # Sepsis secondary to # Multifocal pneumonia with large consolidation in the left lowe lobe and lingula.  # ESRD on HD TTS  Plan []  MRSA swab, sputum culture, urine strep antigen and Legionella antigen. []  It would not be unreasonable managing this as hospital-acquired pneumonia and cover her for Pseudomonas and MRSA specially that she had multiple risk factors including recent presentation and admission on 10/22.  []  Start hydrocortisone 100 mg twice daily per [facility] trial. []  Titrate for SpO2 > 90%.   I personally spent a total of 80 minutes in the care of the patient today including preparing to see the patient, getting/reviewing separately obtained history, performing a medically appropriate exam/evaluation, counseling and educating, placing orders, documenting clinical information in the EHR, independently interpreting results, and communicating results.   Darrin Barn, MD [facility] 07/27/2024 4:01 PM

## 2024-07-27 NOTE — Progress Notes (Signed)
 [facility]  - [place] [facility]   PATIENT NAME: Cassandra [name]    MR#:  [id number]  DATE OF BIRTH:  03/31/1927  SUBJECTIVE:  no family at bedside. Patient currently on high flow nasal cannula oxygen came in with increasing shortness of breath. Had some altered mental status. She is on dialysis completed her dialysis yesterday. Found to have multifocal pneumonia. Patient overall feels better breathing is stable vitals are okay.    VITALS:  Blood pressure (!) 114/49, pulse 71, temperature 98.1 F (36.7 C), temperature source Oral, resp. rate (!) 21, height 5' 1 (1.549 m), weight 59 kg, SpO2 100%.  PHYSICAL EXAMINATION:   GENERAL:  89 y.o.-year-old patient with no acute distress. Morbidly obese LUNGS: decreased breath sounds bilaterally, no wheezing CARDIOVASCULAR: S1, S2 normal. No murmur   ABDOMEN: Soft, nontender, nondistended. Bowel sounds present.  EXTREMITIES: No  edema b/l.   HD Access NEUROLOGIC: nonfocal  patient is alert and awake SKIN: per RN  LABORATORY PANEL:  CBC Recent Labs  Lab 07/27/24 0330  WBC 30.3*  HGB 14.1  HCT 45.3  PLT 971*    Chemistries  Recent Labs  Lab 07/26/24 2015 07/27/24 0330  NA 135 136  K 4.8 4.6  CL 91* 97*  CO2 26 22  GLUCOSE 71 65*  BUN 20 28*  CREATININE 5.46* 5.60*  CALCIUM  8.1* 7.3*  AST 40  --   ALT 18  --   ALKPHOS 61  --   BILITOT 1.1  --    Cardiac Enzymes No results for input(s): TROPONINI in the last 168 hours. RADIOLOGY:  CT CHEST ABDOMEN PELVIS WO CONTRAST Result Date: 07/26/2024 EXAM: CT CHEST, ABDOMEN AND PELVIS WITHOUT CONTRAST 03/05/2023 09:23:51 PM TECHNIQUE: CT of the chest, abdomen and pelvis was performed without the administration of intravenous contrast. Multiplanar reformatted images are provided for review. Automated exposure control, iterative reconstruction, and/or weight based adjustment of the mA/kV was utilized to reduce the radiation dose to as low as reasonably  achievable. COMPARISON: None available. CLINICAL HISTORY: Sepsis. FINDINGS: CHEST: MEDIASTINUM AND LYMPH NODES: Mild cardiomegaly. Diffuse coronary artery and aortic atherosclerosis. The central airways are clear. No mediastinal, hilar or axillary lymphadenopathy. LUNGS AND PLEURA: Consolidation in the left lower lobe and lingula and to a lesser extent right lower lobe compatible with pneumonia. No pleural effusion or pneumothorax. ABDOMEN AND PELVIS: LIVER: The liver is unremarkable. GALLBLADDER AND BILE DUCTS: Small layering gallstones within the gallbladder. No evidence of acute cholecystitis. No biliary ductal dilatation. SPLEEN: No acute abnormality. PANCREAS: No acute abnormality. ADRENAL GLANDS: No acute abnormality. KIDNEYS, URETERS AND BLADDER: Atrophic kidneys bilaterally. Left renal mid pole cyst measures 3.5 cm. Per consensus, no follow-up is needed for simple Bosniak type 1 and 2 renal cysts, unless the patient has a malignancy history or risk factors. The urinary bladder is decompressed. No stones in the kidneys or ureters. No hydronephrosis. No perinephric or periureteral stranding. GI AND BOWEL: Stomach demonstrates no acute abnormality. Postoperative changes in the rectosigmoid colon. Scattered colonic diverticulosis, most pronounced in the left colon. There is no bowel obstruction. REPRODUCTIVE ORGANS: Prior hysterectomy. PERITONEUM AND RETROPERITONEUM: No ascites. No free air. VASCULATURE: Aorta is normal in caliber. ABDOMINAL AND PELVIS LYMPH NODES: No lymphadenopathy. BONES AND SOFT TISSUES: Diffuse degenerative disc and facet disease throughout the thoracolumbar spine. No focal soft tissue abnormality. IMPRESSION: 1. Multifocal consolidation greatest in the left lower lobe and lingula, compatible with pneumonia. 2. Cholelithiasis without acute cholecystitis. 3. Bilateral renal atrophy. 4.  Colonic diverticulosis. 5. Aortic atherosclerosis. Coronary artery disease. Electronically signed by: Franky Crease MD 07/26/2024 09:37 PM EDT RP Workstation: [id number]   CT HEAD WO CONTRAST ( ) Result Date: 07/26/2024 EXAM: CT HEAD WITHOUT CONTRAST 07/26/2024 09:23:51 PM TECHNIQUE: CT of the head was performed without the administration of intravenous contrast. Automated exposure control, iterative reconstruction, and/or weight based adjustment of the mA/kV was utilized to reduce the radiation dose to as low as reasonably achievable. COMPARISON: 05/02/2022 CLINICAL HISTORY: Headache, new onset (Age >= 51y) FINDINGS: BRAIN AND VENTRICLES: No acute hemorrhage. No evidence of acute infarct. No hydrocephalus. No extra-axial collection. No mass effect or midline shift. There is diffuse cerebral atrophy and chronic small vessel disease throughout the deep white matter. ORBITS: No acute abnormality. SINUSES: No acute abnormality. SOFT TISSUES AND SKULL: No acute soft tissue abnormality. No skull fracture. IMPRESSION: 1. No acute intracranial abnormality. 2. Diffuse cerebral atrophy and chronic small vessel disease throughout the deep white matter. Electronically signed by: Franky Crease MD 07/26/2024 09:33 PM EDT RP Workstation: [id number]   DG Chest Portable 1 View Result Date: 07/26/2024 EXAM: 1 VIEW(S) XRAY OF THE CHEST 07/26/2024 08:43:00 PM COMPARISON: Portable chest 04/08/2024. CLINICAL HISTORY: Chest pain and lethargy with hypoxemia. Symptom onset following dialysis this morning. FINDINGS: LINES, TUBES AND DEVICES: An electronic device is superimposed over the chest centrally, and there are overlying telemetry leads. LUNGS AND PLEURA: There is dense airspace disease in the left mid to lower lung fields predominating in the periphery, most likely a consolidated pneumonia likely in the lingula as there is obscuration of the lower left heart border. There is a small pleural effusion, most likely a parapneumonic effusion. Rest of the lungs are clear of infiltrates with chronic interstitial changes. No pneumothorax.  HEART AND MEDIASTINUM: The aorta is tortuous and calcified, stable mediastinum. There is mild cardiomegaly but no evidence for CHF. BONES AND SOFT TISSUES: Osteopenia. There are new mildly displaced posterolateral fractures of the right 6th and 7th ribs, age indeterminate but probably subacute with suggestion of partial healing. DISCS/DEGENERATIVE CHANGES: Moderate thoracic spondylosis. IMPRESSION: 1. Dense left mid to lower lung airspace disease most consistent with lingular pneumonia, with a small likely parapneumonic effusion. Follow-up study recommended to ensure clearing. 2. New mildly displaced posterolateral fractures of the right 6th and 7th ribs, age indeterminate but possibly subacute. Electronically signed by: Francis Quam MD 07/26/2024 08:53 PM EDT RP Workstation: [id number]    Assessment and Plan  JASMANE BROCKWAY is a 89 y.o. female with medical history significant for coronary artery disease, end-stage renal disease on hemodialysis on TTS, hypertension, dyslipidemia, hypothyroidism, lumbar stenosis, RLS and type 2 diabetes mellitus, who presented to the emergency room with acute onset of fatigue and generalized weakness with altered mental status and lethargy.  Blood pressure was 79/38 with EMS.  Patient admitted to cough productive of whitish sputum   Portable chest x-ray showed the following: 1. Dense left mid to lower lung airspace disease most consistent with lingular pneumonia, with a small likely parapneumonic effusion. Follow-up study recommended to ensure clearing. 2. New mildly displaced posterolateral fractures of the right 6th and 7th ribs, age indeterminate but possibly subacute.  CT chest/abdomen: 1. Multifocal consolidation greatest in the left lower lobe and lingula, compatible with pneumonia. 2. Cholelithiasis without acute cholecystitis. 3. Bilateral renal atrophy. 4. Colonic diverticulosis. 5. Aortic atherosclerosis. Coronary artery disease.   Severe sepsis  due to multifocal pneumonia (HCC) -pt currently on IV Rocephin  and Zithromax . -- Patient came in with  altered mental status, respiratory distress, elevated white count, elevated lactic acid and multifocal pneumonia. - Mucolytic therapy be provided as well as duo nebs q.i.d. and q.4 hours p.r.n. - blood cultures negative so far- -continue hydration with IV lactated ringer . -- Pulmonary consultation--Dr Assaker--change to zosyn,vanc and azithromycin  --IV hydrocortisone --stress does --MRSA PCR pending   Elevated troponin w/o chest pain--likley demand ischemia with sespsi --Iv heparin  for 48 hours per dr Fernand  - She is not having any current chest pain though she is diabetic. - continue aspirin  and Plavix. - T on high-dose statin therapy   Depression - Continue Zoloft.   Dyslipidemia -continue statin therapy.   Hypothyroidism -  continue Synthroid .   Essential hypertension - continue antihypertensive therapy.    Procedures: Family communication :none Consults :Pulmonary CODE STATUS: DNR DVT Prophylaxis :heparin  gtt Level of care: Progressive Status is: Inpatient Remains inpatient appropriate because: severe sepsis due to multifoca pneumonia    TOTAL TIME TAKING CARE OF THIS PATIENT: 45 minutes.  >50% time spent on counselling and coordination of care  Note: This dictation was prepared with Dragon dictation along with smaller phrase technology. Any transcriptional errors that result from this process are unintentional.  Leita Blanch M.D    [facility]   CC: Primary care physician; Cleotilde Oneil FALCON, MD

## 2024-07-27 NOTE — ED Provider Notes (Signed)
 Clinical Course as of 07/27/24 0019  Sat Jul 26, 2024  2256 Received signout out: on 8L with pneumonia. Sepsis, Dialysis patient. Issue is fluctuating pressures. Full code.  [HD]  2342 I just reevaluated the patient.  Patient is resting comfortably has no complaints.  Last set of blood pressures have been with a MAP over 65.  Nursing has held the midodrine given her stability. [HD]    Clinical Course User Index [HD] Nicholaus Rolland BRAVO, MD      Nicholaus Rolland BRAVO, MD 07/27/24 [id number]

## 2024-07-27 NOTE — Consult Note (Signed)
 Cassandra [name] is a 89 y.o. female  [id number]  Primary Cardiologist: [facility] cardiology Reason for Consultation: Elevated troponin  HPI: This is a 89 year old African-American female with a past medical history of hypertension hyperlipidemia coronary artery disease and end-stage renal disease on hemodialysis presented to the hospital with confusion, weakness and cough productive of white to yellow sputum.  In the emergency room she had low-grade temperature 99.5 and was desaturating at 89% in spite of being on 2 L of nasal cannula and was hypotensive with blood pressure of 87/43.  Chest x-ray showed dense left mid to lower lung airspace disease consistent with lingular pneumonia.  Patient right now is coughing and is confused and has pulled her IV out.  She denies any chest pain but is short of breath.   Review of Systems: Patient also has fever   Past Medical History:  Diagnosis Date   Anxiety    Aortic atherosclerosis    Arthritis    osteoarthritis in hands, knees ( R torn cartilage, L meniscus removed)    Atherosclerosis of native coronary artery of native heart with unstable angina pectoris (HCC)    CAD (coronary artery disease) 1999   a.) s/p PCI (details unkown) in [place]  in 1999   Campylobacter antigen positive 11/29/2014   a.) presented to [facility] ED with N/V/D; stool culture (+)   ESRD on hemodialysis (HCC)    a.) T-Th-Sat   Glaucoma    Hypercholesterolemia    Hyperlipidemia    Hypertension    Hypothyroidism    Lumbar stenosis with neurogenic claudication    Perforation of colon as colonoscopy complication (HCC) 2010   a.) required short term colostomy with subsequent take down/reversal   RLS (restless legs syndrome)    Thrombocytosis    Type 2 diabetes mellitus with chronic kidney disease (HCC)     (Not in a hospital admission)     amLODipine   5 mg Oral Daily   aspirin  EC  81 mg Oral Daily   calcium  acetate  1,334 mg Oral TID WC   cloNIDine   0.1 mg Oral BID    clopidogrel  75 mg Oral Daily   feeding supplement (NEPRO CARB STEADY)  237 mL Oral BID BM   gabapentin   100 mg Oral BID   And   gabapentin   200 mg Oral QHS   hydroxyurea   300 mg Oral Daily   levothyroxine   88 mcg Oral Q0600   midodrine  10 mg Oral TID WC   rosuvastatin   10 mg Oral Daily   sertraline  50 mg Oral Daily   traZODone   25-50 mg Oral QHS    Infusions:  azithromycin      cefTRIAXone  (ROCEPHIN )  IV     heparin  Stopped (07/27/24 0820)   lactated ringers  150 mL/hr (07/27/24 0258)    Allergies  Allergen Reactions   Atorvastatin Other (See Comments)    MYALGIA   Cyclobenzaprine Other (See Comments)    hallucination   Mirtazapine     Other reaction(s): Hallucination   Oxycodone-Acetaminophen  Other (See Comments)    hallucination   Paroxetine Hcl Other (See Comments)    hallucination   Penicillins Swelling    Lip and orbital swelling Tolerated 3rd generation cephalosporin (CEFTRIAXONE ) between 03/05/2023 and 03/09/2023 with no documented ADRs.   Propoxyphene Other (See Comments)    hallucination   Trazodone  Other (See Comments)    hallucination    Social History   Socioeconomic History   Marital status: Married  Spouse name: Elsie   Number of children: 3   Years of education: Not on file   Highest education level: Not on file  Occupational History   Not on file  Tobacco Use   Smoking status: Former    Current packs/day: 0.00    Types: Cigarettes    Quit date: 01/21/1980    Years since quitting: 44.5   Smokeless tobacco: Never  Vaping Use   Vaping status: Never Used  Substance and Sexual Activity   Alcohol use: Yes    Alcohol/week: 0.0 standard drinks of alcohol    Comment: occasionally   Drug use: No   Sexual activity: Not Currently  Other Topics Concern   Not on file  Social History Narrative   Lives with spouse elsie in an apt.    Social Drivers of Corporate Investment Banker Strain: Low Risk  (06/25/2024)   Received from [facility]   Overall Financial Resource Strain (CARDIA)    Difficulty of Paying Living Expenses: Not hard at all  Food Insecurity: No Food Insecurity (07/17/2024)   Hunger Vital Sign    Worried About Running Out of Food in the Last Year: Never true    Ran Out of Food in the Last Year: Never true  Transportation Needs: No Transportation Needs (07/17/2024)   PRAPARE - Administrator, Civil Service (Medical): No    Lack of Transportation (Non-Medical): No  Physical Activity: Not on file  Stress: Not on file  Social Connections: Moderately Integrated (07/17/2024)   Social Connection and Isolation Panel    Frequency of Communication with Friends and Family: More than three times a week    Frequency of Social Gatherings with Friends and Family: More than three times a week    Attends Religious Services: 1 to 4 times per year    Active Member of [employer] or Organizations: No    Attends Banker Meetings: Never    Marital Status: Married  Catering Manager Violence: Not At Risk (07/17/2024)   Humiliation, Afraid, Rape, and Kick questionnaire    Fear of Current or Ex-Partner: No    Emotionally Abused: No    Physically Abused: No    Sexually Abused: No    Family History  Problem Relation Age of Onset   Hypertension Mother    Hypertension Father    Early death Father    Heart disease Father     PHYSICAL EXAM: Vitals:   07/27/24 0800 07/27/24 0830  BP: 98/72 (!) 101/41  Pulse:  77  Resp:  18  Temp:    SpO2:  98%     Intake/Output Summary (Last 24 hours) at 07/27/2024 0910 Last data filed at 07/27/2024 0220 Gross per 24 hour  Intake 2062.5 ml  Output --  Net 2062.5 ml    General:  Well appearing. No respiratory difficulty HEENT: normal Neck: supple. no JVD. Carotids 2+ bilat; no bruits. No lymphadenopathy or thryomegaly appreciated. Cor: PMI nondisplaced. Regular rate & rhythm. No rubs, gallops or murmurs. Lungs: clear Abdomen: soft,  nontender, nondistended. No hepatosplenomegaly. No bruits or masses. Good bowel sounds. Extremities: no cyanosis, clubbing, rash, edema Neuro: alert & oriented x 3, cranial nerves grossly intact. moves all 4 extremities w/o difficulty. Affect pleasant.  ECG: Sinus rhythm 82 bpm with right atrial enlargement, LVH poor R wave progression suggestive of anteroseptal wall old MI with nonspecific ST-T changes.  Results for orders placed or performed during the hospital encounter of 07/26/24 (  from the past 24 hours)  Blood gas, venous     Status: Abnormal   Collection Time: 07/26/24  8:06 PM  Result Value Ref Range   O2 Content 6.0 L/min   Delivery systems NASAL CANNULA    pH, Ven 7.4 7.25 - 7.43   pCO2, Ven 60 44 - 60 mmHg   pO2, Ven <31 (LL) 32 - 45 mmHg   Bicarbonate 37.2 (H) 20.0 - 28.0 mmol/L   Acid-Base Excess 10.0 (H) 0.0 - 2.0 mmol/L   O2 Saturation 26.3 %   Patient temperature 37.0    Collection site VEIN   Blood culture (routine x 2)     Status: None (Preliminary result)   Collection Time: 07/26/24  8:11 PM   Specimen: BLOOD  Result Value Ref Range   Specimen Description BLOOD RIGHT ANTECUBITAL    Special Requests      BOTTLES DRAWN AEROBIC AND ANAEROBIC Blood Culture adequate volume   Culture      NO GROWTH < 12 HOURS Performed at [facility], [address] [place], [place] [address]    Report Status PENDING   CBC with Differential     Status: Abnormal   Collection Time: 07/26/24  8:15 PM  Result Value Ref Range   WBC 34.4 (H) 4.0 - 10.5 K/uL   RBC 5.16 (H) 3.87 - 5.11 MIL/uL   Hemoglobin 16.2 (H) 12.0 - 15.0 g/dL   HCT 47.7 (H) 63.9 - 53.9 %   MCV 101.2 (H) 80.0 - 100.0 fL   MCH 31.4 26.0 - 34.0 pg   MCHC 31.0 30.0 - 36.0 g/dL   RDW 84.0 (H) 88.4 - 84.4 %   Platelets 1,075 (HH) 150 - 400 K/uL   nRBC 0.0 0.0 - 0.2 %   Neutrophils Relative % 84 %   Neutro Abs 28.6 (H) 1.7 - 7.7 K/uL   Lymphocytes Relative 3 %   Lymphs Abs 1.2 0.7 - 4.0 K/uL   Monocytes  Relative 8 %   Monocytes Absolute 2.8 (H) 0.1 - 1.0 K/uL   Eosinophils Relative 0 %   Eosinophils Absolute 0.1 0.0 - 0.5 K/uL   Basophils Relative 1 %   Basophils Absolute 0.2 (H) 0.0 - 0.1 K/uL   RBC Morphology See Note    Smear Review See Note    Immature Granulocytes 4 %   Abs Immature Granulocytes 1.45 (H) 0.00 - 0.07 K/uL   Reactive, Benign Lymphocytes PRESENT   Comprehensive metabolic panel     Status: Abnormal   Collection Time: 07/26/24  8:15 PM  Result Value Ref Range   Sodium 135 135 - 145 mmol/L   Potassium 4.8 3.5 - 5.1 mmol/L   Chloride 91 (L) 98 - 111 mmol/L   CO2 26 22 - 32 mmol/L   Glucose, Bld 71 70 - 99 mg/dL   BUN 20 8 - 23 mg/dL   Creatinine, Ser 4.53 (H) 0.44 - 1.00 mg/dL   Calcium  8.1 (L) 8.9 - 10.3 mg/dL   Total Protein 7.5 6.5 - 8.1 g/dL   Albumin  3.0 (L) 3.5 - 5.0 g/dL   AST 40 15 - 41 U/L   ALT 18 0 - 44 U/L   Alkaline Phosphatase 61 38 - 126 U/L   Total Bilirubin 1.1 0.0 - 1.2 mg/dL   GFR, Estimated 7 (L) >60 mL/min   Anion gap 18 (H) 5 - 15  Lactic acid, plasma     Status: Abnormal   Collection Time: 07/26/24  8:15  PM  Result Value Ref Range   Lactic Acid, Venous 3.4 (HH) 0.5 - 1.9 mmol/L  Troponin I (High Sensitivity)     Status: Abnormal   Collection Time: 07/26/24  8:15 PM  Result Value Ref Range   Troponin I (High Sensitivity) 200 (HH) <18 ng/L  Salicylate level     Status: Abnormal   Collection Time: 07/26/24  8:15 PM  Result Value Ref Range   Salicylate Lvl <7.0 (L) 7.0 - 30.0 mg/dL  Acetaminophen  level     Status: Abnormal   Collection Time: 07/26/24  8:15 PM  Result Value Ref Range   Acetaminophen  (Tylenol ), Serum <10 (L) 10 - 30 ug/mL  Resp panel by RT-PCR (RSV, Flu A&B, Covid) Anterior Nasal Swab     Status: None   Collection Time: 07/26/24  8:15 PM   Specimen: Anterior Nasal Swab  Result Value Ref Range   SARS Coronavirus 2 by RT PCR NEGATIVE NEGATIVE   Influenza A by PCR NEGATIVE NEGATIVE   Influenza B by PCR NEGATIVE  NEGATIVE   Resp Syncytial Virus by PCR NEGATIVE NEGATIVE  Blood culture (routine x 2)     Status: None (Preliminary result)   Collection Time: 07/26/24  8:16 PM   Specimen: BLOOD  Result Value Ref Range   Specimen Description BLOOD RIGHT ANTECUBITAL    Special Requests      BOTTLES DRAWN AEROBIC AND ANAEROBIC Blood Culture results may not be optimal due to an inadequate volume of blood received in culture bottles   Culture      NO GROWTH < 12 HOURS Performed at [facility], [address] [place], [place] [address]    Report Status PENDING   Lactic acid, plasma     Status: Abnormal   Collection Time: 07/26/24 10:06 PM  Result Value Ref Range   Lactic Acid, Venous 3.8 (HH) 0.5 - 1.9 mmol/L  Troponin I (High Sensitivity)     Status: Abnormal   Collection Time: 07/26/24 10:06 PM  Result Value Ref Range   Troponin I (High Sensitivity) 322 (HH) <18 ng/L  Lactic acid, plasma     Status: Abnormal   Collection Time: 07/26/24 11:30 PM  Result Value Ref Range   Lactic Acid, Venous 3.8 (HH) 0.5 - 1.9 mmol/L  CBC with Differential     Status: Abnormal   Collection Time: 07/26/24 11:30 PM  Result Value Ref Range   WBC 37.3 (H) 4.0 - 10.5 K/uL   RBC 4.41 3.87 - 5.11 MIL/uL   Hemoglobin 13.9 12.0 - 15.0 g/dL   HCT 54.8 63.9 - 53.9 %   MCV 102.3 (H) 80.0 - 100.0 fL   MCH 31.5 26.0 - 34.0 pg   MCHC 30.8 30.0 - 36.0 g/dL   RDW 83.6 (H) 88.4 - 84.4 %   Platelets 1,090 (HH) 150 - 400 K/uL   nRBC 0.0 0.0 - 0.2 %   Neutrophils Relative % 81 %   Neutro Abs 30.4 (H) 1.7 - 7.7 K/uL   Lymphocytes Relative 5 %   Lymphs Abs 1.7 0.7 - 4.0 K/uL   Monocytes Relative 8 %   Monocytes Absolute 3.1 (H) 0.1 - 1.0 K/uL   Eosinophils Relative 0 %   Eosinophils Absolute 0.1 0.0 - 0.5 K/uL   Basophils Relative 1 %   Basophils Absolute 0.2 (H) 0.0 - 0.1 K/uL   RBC Morphology See Note    Smear Review See Note    Immature Granulocytes 5 %   Abs Immature  Granulocytes 1.90 (H) 0.00 - 0.07 K/uL   Lactic acid, plasma     Status: Abnormal   Collection Time: 07/27/24  1:01 AM  Result Value Ref Range   Lactic Acid, Venous 4.3 (HH) 0.5 - 1.9 mmol/L  Protime-INR     Status: Abnormal   Collection Time: 07/27/24  3:29 AM  Result Value Ref Range   Prothrombin Time 17.7 (H) 11.4 - 15.2 seconds   INR 1.4 (H) 0.8 - 1.2  Basic metabolic panel     Status: Abnormal   Collection Time: 07/27/24  3:30 AM  Result Value Ref Range   Sodium 136 135 - 145 mmol/L   Potassium 4.6 3.5 - 5.1 mmol/L   Chloride 97 (L) 98 - 111 mmol/L   CO2 22 22 - 32 mmol/L   Glucose, Bld 65 (L) 70 - 99 mg/dL   BUN 28 (H) 8 - 23 mg/dL   Creatinine, Ser 4.39 (H) 0.44 - 1.00 mg/dL   Calcium  7.3 (L) 8.9 - 10.3 mg/dL   GFR, Estimated 7 (L) >60 mL/min   Anion gap 17 (H) 5 - 15  CBC     Status: Abnormal   Collection Time: 07/27/24  3:30 AM  Result Value Ref Range   WBC 30.3 (H) 4.0 - 10.5 K/uL   RBC 4.41 3.87 - 5.11 MIL/uL   Hemoglobin 14.1 12.0 - 15.0 g/dL   HCT 54.6 63.9 - 53.9 %   MCV 102.7 (H) 80.0 - 100.0 fL   MCH 32.0 26.0 - 34.0 pg   MCHC 31.1 30.0 - 36.0 g/dL   RDW 84.0 (H) 88.4 - 84.4 %   Platelets 971 (HH) 150 - 400 K/uL   nRBC 0.0 0.0 - 0.2 %  Lactic acid, plasma     Status: Abnormal   Collection Time: 07/27/24  7:52 AM  Result Value Ref Range   Lactic Acid, Venous 2.5 (HH) 0.5 - 1.9 mmol/L   CT CHEST ABDOMEN PELVIS WO CONTRAST Result Date: 07/26/2024 EXAM: CT CHEST, ABDOMEN AND PELVIS WITHOUT CONTRAST 03/05/2023 09:23:51 PM TECHNIQUE: CT of the chest, abdomen and pelvis was performed without the administration of intravenous contrast. Multiplanar reformatted images are provided for review. Automated exposure control, iterative reconstruction, and/or weight based adjustment of the mA/kV was utilized to reduce the radiation dose to as low as reasonably achievable. COMPARISON: None available. CLINICAL HISTORY: Sepsis. FINDINGS: CHEST: MEDIASTINUM AND LYMPH NODES: Mild cardiomegaly. Diffuse coronary artery  and aortic atherosclerosis. The central airways are clear. No mediastinal, hilar or axillary lymphadenopathy. LUNGS AND PLEURA: Consolidation in the left lower lobe and lingula and to a lesser extent right lower lobe compatible with pneumonia. No pleural effusion or pneumothorax. ABDOMEN AND PELVIS: LIVER: The liver is unremarkable. GALLBLADDER AND BILE DUCTS: Small layering gallstones within the gallbladder. No evidence of acute cholecystitis. No biliary ductal dilatation. SPLEEN: No acute abnormality. PANCREAS: No acute abnormality. ADRENAL GLANDS: No acute abnormality. KIDNEYS, URETERS AND BLADDER: Atrophic kidneys bilaterally. Left renal mid pole cyst measures 3.5 cm. Per consensus, no follow-up is needed for simple Bosniak type 1 and 2 renal cysts, unless the patient has a malignancy history or risk factors. The urinary bladder is decompressed. No stones in the kidneys or ureters. No hydronephrosis. No perinephric or periureteral stranding. GI AND BOWEL: Stomach demonstrates no acute abnormality. Postoperative changes in the rectosigmoid colon. Scattered colonic diverticulosis, most pronounced in the left colon. There is no bowel obstruction. REPRODUCTIVE ORGANS: Prior hysterectomy. PERITONEUM AND RETROPERITONEUM: No ascites. No free air. VASCULATURE:  Aorta is normal in caliber. ABDOMINAL AND PELVIS LYMPH NODES: No lymphadenopathy. BONES AND SOFT TISSUES: Diffuse degenerative disc and facet disease throughout the thoracolumbar spine. No focal soft tissue abnormality. IMPRESSION: 1. Multifocal consolidation greatest in the left lower lobe and lingula, compatible with pneumonia. 2. Cholelithiasis without acute cholecystitis. 3. Bilateral renal atrophy. 4. Colonic diverticulosis. 5. Aortic atherosclerosis. Coronary artery disease. Electronically signed by: Franky Crease MD 07/26/2024 09:37 PM EDT RP Workstation: [id number]   CT HEAD WO CONTRAST ( ) Result Date: 07/26/2024 EXAM: CT HEAD WITHOUT CONTRAST  07/26/2024 09:23:51 PM TECHNIQUE: CT of the head was performed without the administration of intravenous contrast. Automated exposure control, iterative reconstruction, and/or weight based adjustment of the mA/kV was utilized to reduce the radiation dose to as low as reasonably achievable. COMPARISON: 05/02/2022 CLINICAL HISTORY: Headache, new onset (Age >= 51y) FINDINGS: BRAIN AND VENTRICLES: No acute hemorrhage. No evidence of acute infarct. No hydrocephalus. No extra-axial collection. No mass effect or midline shift. There is diffuse cerebral atrophy and chronic small vessel disease throughout the deep white matter. ORBITS: No acute abnormality. SINUSES: No acute abnormality. SOFT TISSUES AND SKULL: No acute soft tissue abnormality. No skull fracture. IMPRESSION: 1. No acute intracranial abnormality. 2. Diffuse cerebral atrophy and chronic small vessel disease throughout the deep white matter. Electronically signed by: Franky Crease MD 07/26/2024 09:33 PM EDT RP Workstation: [id number]   DG Chest Portable 1 View Result Date: 07/26/2024 EXAM: 1 VIEW(S) XRAY OF THE CHEST 07/26/2024 08:43:00 PM COMPARISON: Portable chest 04/08/2024. CLINICAL HISTORY: Chest pain and lethargy with hypoxemia. Symptom onset following dialysis this morning. FINDINGS: LINES, TUBES AND DEVICES: An electronic device is superimposed over the chest centrally, and there are overlying telemetry leads. LUNGS AND PLEURA: There is dense airspace disease in the left mid to lower lung fields predominating in the periphery, most likely a consolidated pneumonia likely in the lingula as there is obscuration of the lower left heart border. There is a small pleural effusion, most likely a parapneumonic effusion. Rest of the lungs are clear of infiltrates with chronic interstitial changes. No pneumothorax. HEART AND MEDIASTINUM: The aorta is tortuous and calcified, stable mediastinum. There is mild cardiomegaly but no evidence for CHF. BONES AND SOFT  TISSUES: Osteopenia. There are new mildly displaced posterolateral fractures of the right 6th and 7th ribs, age indeterminate but probably subacute with suggestion of partial healing. DISCS/DEGENERATIVE CHANGES: Moderate thoracic spondylosis. IMPRESSION: 1. Dense left mid to lower lung airspace disease most consistent with lingular pneumonia, with a small likely parapneumonic effusion. Follow-up study recommended to ensure clearing. 2. New mildly displaced posterolateral fractures of the right 6th and 7th ribs, age indeterminate but possibly subacute. Electronically signed by: Francis Quam MD 07/26/2024 08:53 PM EDT RP Workstation: [id number]     ASSESSMENT AND PLAN: #1 sepsis due to pneumonia/hypotension.  Patient was started on Rocephin  and Zithromax  and pancultures were done along with giving IV fluid which improved the patient hemodynamically. #2 elevated troponin with peak troponin 322, most likely due to demand ischemia with no acute EKG changes and not having chest pain.  Advise continuing patient's medical medications including Plavix and aspirin .  Will give heparin  for 48 hours and then discontinue tomorrow.  Will look at echocardiogram for wall motion abnormality and ejection fraction. Thank you very much for referral. Denyse Bathe

## 2024-07-27 NOTE — Consult Note (Signed)
 Pharmacy Antibiotic Note  Cassandra [name] is a 89 y.o. female with a PMH of ESRD on HD TTS, CAD, T2DM, and HLD admitted on 07/26/2024 with multifocal pneumonia.  Discussed with Dr. Malka, Darrin and Patel,Sona adding Zosyn and Vancomycin to broaden patient's antibiotics. MD ordered MRSA nasal swab, if negative ok to discontinue the Vancomycin. Blood cultures pending. Pharmacy has been consulted for vancomycin and zosyn dosing.  Plan: - Vancomycin 1,250 mg loading dose given at 0027 on 11/2. - MRSA resulted negative at 1700 on 11/2. Discontinued the vancomycin - Zosyn 2.25G q8h on HD days (TTS), give one dose today.  - Will follow cultures to determine further antibiotic selection and duration of therapy  Height: 5' 1 (154.9 cm) Weight: 59 kg (130 lb 1.6 oz) IBW/kg (Calculated) : 47.8  Temp (24hrs), Avg:99 F (37.2 C), Min:98.1 F (36.7 C), Max:99.6 F (37.6 C)  Recent Labs  Lab 07/26/24 2015 07/26/24 2206 07/26/24 2330 07/27/24 0101 07/27/24 0330 07/27/24 0752 07/27/24 1044  WBC 34.4*  --  37.3*  --  30.3*  --   --   CREATININE 5.46*  --   --   --  5.60*  --   --   LATICACIDVEN 3.4* 3.8* 3.8* 4.3*  --  2.5* 3.4*    Estimated Creatinine Clearance: 5.1 mL/min (A) (by C-G formula based on SCr of 5.6 mg/dL (H)).    Allergies  Allergen Reactions   Atorvastatin Other (See Comments)    MYALGIA   Cyclobenzaprine Other (See Comments)    hallucination   Mirtazapine     Other reaction(s): Hallucination   Oxycodone-Acetaminophen  Other (See Comments)    hallucination   Paroxetine Hcl Other (See Comments)    hallucination   Penicillins Swelling    Lip and orbital swelling Tolerated 3rd generation cephalosporin (CEFTRIAXONE ) between 03/05/2023 and 03/09/2023 with no documented ADRs.   Propoxyphene Other (See Comments)    hallucination   Trazodone  Other (See Comments)    hallucination    Antimicrobials this admission: 11/1 Cefepime>> x1 11/2 Vancomycin >> 11/2  completed 11/2 Zosyn  >>  11/2 Azithromycin >>  Microbiology results: 11/1 BCx: NGTD 11/1 Sputum: pending  11/2 MRSA PCR: negative  Thank you for allowing pharmacy to be a part of this patient's care.  Annabella LOISE Banks, PharmD Clinical Pharmacist 07/27/2024 6:44 PM

## 2024-07-28 ENCOUNTER — Encounter: Payer: Self-pay | Admitting: Family Medicine

## 2024-07-28 DIAGNOSIS — J189 Pneumonia, unspecified organism: Secondary | ICD-10-CM | POA: Diagnosis not present

## 2024-07-28 DIAGNOSIS — A419 Sepsis, unspecified organism: Secondary | ICD-10-CM | POA: Diagnosis not present

## 2024-07-28 LAB — CBC
HCT: 42.4 % (ref 36.0–46.0)
Hemoglobin: 13.7 g/dL (ref 12.0–15.0)
MCH: 32.2 pg (ref 26.0–34.0)
MCHC: 32.3 g/dL (ref 30.0–36.0)
MCV: 99.5 fL (ref 80.0–100.0)
Platelets: 982 K/uL (ref 150–400)
RBC: 4.26 MIL/uL (ref 3.87–5.11)
RDW: 15.9 % — ABNORMAL HIGH (ref 11.5–15.5)
WBC: 29.9 K/uL — ABNORMAL HIGH (ref 4.0–10.5)
nRBC: 0 % (ref 0.0–0.2)

## 2024-07-28 LAB — HEPARIN LEVEL (UNFRACTIONATED): Heparin Unfractionated: 0.44 [IU]/mL (ref 0.30–0.70)

## 2024-07-28 MED ORDER — LIDOCAINE VISCOUS HCL 2 % MT SOLN
15.0000 mL | OROMUCOSAL | Status: DC | PRN
  Filled 2024-07-28: qty 15

## 2024-07-28 MED ORDER — HYDRALAZINE HCL 20 MG/ML IJ SOLN
10.0000 mg | Freq: Four times a day (QID) | INTRAMUSCULAR | Status: DC | PRN
Start: 2024-07-28 — End: 2024-08-06
  Administered 2024-07-28 – 2024-08-01 (×2): 10 mg via INTRAVENOUS
  Filled 2024-07-28 (×2): qty 1

## 2024-07-28 MED ORDER — CHLORHEXIDINE GLUCONATE CLOTH 2 % EX PADS
6.0000 | MEDICATED_PAD | Freq: Every day | CUTANEOUS | Status: DC
  Administered 2024-07-29 – 2024-08-06 (×9): 6 via TOPICAL

## 2024-07-28 MED ORDER — IPRATROPIUM-ALBUTEROL 0.5-2.5 (3) MG/3ML IN SOLN
3.0000 mL | Freq: Three times a day (TID) | RESPIRATORY_TRACT | Status: DC
  Administered 2024-07-28 – 2024-07-31 (×8): 3 mL via RESPIRATORY_TRACT
  Filled 2024-07-28 (×9): qty 3

## 2024-07-28 MED ORDER — PENTAFLUOROPROP-TETRAFLUOROETH EX AERO: 1.0000 | INHALATION_SPRAY | CUTANEOUS | Status: DC | PRN

## 2024-07-28 MED ORDER — LOPERAMIDE HCL 2 MG PO CAPS
2.0000 mg | ORAL_CAPSULE | ORAL | Status: AC | PRN
  Administered 2024-07-28: 2 mg via ORAL
  Filled 2024-07-28: qty 1

## 2024-07-28 MED ORDER — PENTAFLUOROPROP-TETRAFLUOROETH EX AERO
1.0000 | INHALATION_SPRAY | CUTANEOUS | Status: DC | PRN
Start: 2024-07-28 — End: 2024-07-29

## 2024-07-28 MED ORDER — SODIUM CHLORIDE 0.9 % IV SOLN: INTRAVENOUS | Status: AC | PRN

## 2024-07-28 MED ORDER — HEPARIN SODIUM (PORCINE) 1000 UNIT/ML DIALYSIS: 1000.0000 [IU] | INTRAMUSCULAR | Status: DC | PRN

## 2024-07-28 MED ORDER — LIDOCAINE-PRILOCAINE 2.5-2.5 % EX CREA
1.0000 | TOPICAL_CREAM | CUTANEOUS | Status: DC | PRN
Start: 2024-07-28 — End: 2024-07-29

## 2024-07-28 NOTE — Evaluation (Signed)
 Occupational Therapy Evaluation Patient Details Name: Cassandra [name] MRN: [id number] DOB: [birth year] Today's Date: 07/28/2024   History of Present Illness   Cassandra [name] is a 89 y.o. female with medical history significant for coronary artery disease, end-stage renal disease on hemodialysis on TTS, hypertension, dyslipidemia, hypothyroidism, lumbar stenosis, RLS and type 2 diabetes mellitus, who presented to the emergency room with acute onset of fatigue and generalized weakness with altered mental status and lethargy.  Blood pressure was 79/38 with EMS.  Patient admitted to cough productive of whitish sputum.  No chest pain or palpitations..  She admitted to chest pain yesterday though.  She had low-grade fever of 99.5.  No dysuria, urinary frequency or urgency, hematuria or flank pain.  No diarrhea or melena or bright red bleeding per rectum.  No other bleeding diathesis.     Clinical Impressions Patient was seen for OT evaluation this date. Patient lethargic and poor historian throughout OT eval. Per patient report, prior to hospital admission, patient was managing basic ADLs without A with exception of bathing, spouse provided some assist. Patient lives in first floor apartment with good support from spouse. Patient has been hospitalized due to  sepsis related to PNA. Upon OT entry, patient asleep in bed, agreeable to tx. She presented with difficulty feeding herself, OT was assisting and patient was inconsistent with being able to see food items/tray in front of her even with OT assisted with feeding; OT notified RN who is agreeable to order someone to help patient feed. Attempted to have patient transfer to recliner, once into standing, she needed to have BM, OT set up [facility] and patient was able to void with increased time. When trying to transfer, patient was inconsistent with following commands and presented with difficulty in problem solving and sequencing body mechanics. While attempting  to transfer, patient reports not feeling well and needed to throw up (all she did was spit in basin). OT unsure of patients cognitive status at baseline and presenting with poor attention to task and sequencing transfers. OT recommending SNF. Patient presents with deficits in standing tolerance, activity tolerance, attention to task, safety awareness, affecting safe and optimal ADL completion. Patient is currently requiring max A for ADLs.  Paient would benefit from skilled OT services to address noted impairments and functional limitations (see below for any additional details) in order to maximize safety and independence while minimizing future risk of falls, injury, and readmission. Anticipate the need for follow up OT services upon acute hospital DC.      If plan is discharge home, recommend the following:   A lot of help with walking and/or transfers;A lot of help with bathing/dressing/bathroom;Assistance with feeding;Direct supervision/assist for medications management     Functional Status Assessment   Patient has had a recent decline in their functional status and demonstrates the ability to make significant improvements in function in a reasonable and predictable amount of time.     Equipment Recommendations   Other (comment) (defer to next venue of care)     Recommendations for Other Services         Precautions/Restrictions   Precautions Precautions: Fall Recall of Precautions/Restrictions: Impaired Restrictions Weight Bearing Restrictions Per Provider Order: No     Mobility Bed Mobility Overal bed mobility: Needs Assistance Bed Mobility: Supine to Sit, Sit to Supine     Supine to sit: Max assist, HOB elevated, Used rails Sit to supine: Max assist, Used rails, HOB elevated   General bed mobility comments: A  to lift/lower BLE on/off bed, A to scoot closer to EOB while sitting, A to reposition UB    Transfers Overall transfer level: Needs  assistance Equipment used: Rolling walker (2 wheels) Transfers: Bed to chair/wheelchair/BSC Sit to Stand: Mod assist, Max assist     Step pivot transfers: Max assist, Mod assist     General transfer comment: SPT from EOB to [facility]; attempted to transfer to recliner, patient disoriented during transfer and required A to return to bed      Balance Overall balance assessment: Needs assistance Sitting-balance support: Feet unsupported, Bilateral upper extremity supported Sitting balance-Leahy Scale: Fair Sitting balance - Comments: required steadying A   Standing balance support: Bilateral upper extremity supported Standing balance-Leahy Scale: Poor                             ADL either performed or assessed with clinical judgement   ADL Overall ADL's : Needs assistance/impaired Eating/Feeding: Maximal assistance;Bed level                   Lower Body Dressing: Maximal assistance Lower Body Dressing Details (indicate cue type and reason): required A to don socks Toilet Transfer: Maximal assistance;Stand-pivot;BSC/3in1;Rolling walker (2 wheels);Cueing for safety;Cueing for sequencing   Toileting- Clothing Manipulation and Hygiene: Maximal assistance;Sit to/from stand               Vision         Perception         Praxis         Pertinent Vitals/Pain Pain Assessment Pain Assessment: No/denies pain     Extremity/Trunk Assessment Upper Extremity Assessment Upper Extremity Assessment: Generalized weakness   Lower Extremity Assessment Lower Extremity Assessment: Defer to PT evaluation       Communication Communication Communication: Impaired Factors Affecting Communication: Difficulty expressing self;Reduced clarity of speech   Cognition Arousal: Lethargic Behavior During Therapy: WFL for tasks assessed/performed Cognition: No family/caregiver present to determine baseline, Cognition impaired, Difficult to assess   Orientation  impairments: Person, Place, Time Awareness: Intellectual awareness impaired, Online awareness impaired   Attention impairment (select first level of impairment): Sustained attention Executive functioning impairment (select all impairments): Sequencing, Organization, Reasoning, Problem solving                   Following commands: Impaired Following commands impaired: Follows one step commands inconsistently     Cueing  General Comments   Cueing Techniques: Verbal cues;Tactile cues      Exercises     Shoulder Instructions      Home Living Family/patient expects to be discharged to:: Private residence Living Arrangements: Spouse/significant other Available Help at Discharge: Family;Available PRN/intermittently Type of Home: Apartment Home Access: Level entry     Home Layout: One level     Bathroom Shower/Tub: Chief Strategy Officer: Standard Bathroom Accessibility: Yes How Accessible: Accessible via walker Home Equipment: Rollator (4 wheels);Tub bench;Rolling Walker (2 wheels);BSC/3in1          Prior Functioning/Environment Prior Level of Function : Needs assist             Mobility Comments: Pt reports using rollator for ambulation, denies hx of falls, limited community ambulator ADLs Comments: patient was able to dress, toilet and groom without A, she would only take showers when spouse could assist    OT Problem List: Decreased strength;Decreased activity tolerance;Impaired balance (sitting and/or standing);Decreased safety awareness;Decreased knowledge of use  of DME or AE   OT Treatment/Interventions: Self-care/ADL training;Energy conservation;DME and/or AE instruction;Therapeutic exercise;Therapeutic activities;Patient/family education;Balance training      OT Goals(Current goals can be found in the care plan section)   Acute Rehab OT Goals Patient Stated Goal: to go home OT Goal Formulation: With patient Time For Goal  Achievement: 08/11/24 Potential to Achieve Goals: Good ADL Goals Pt Will Perform Grooming: with supervision;sitting;standing Pt Will Perform Upper Body Bathing: sitting;with supervision Pt Will Perform Lower Body Dressing: with min assist;sit to/from stand Pt Will Transfer to Toilet: with contact guard assist;ambulating;bedside commode   OT Frequency:  Min 2X/week    Co-evaluation              AM-PAC OT 6 Clicks Daily Activity     Outcome Measure Help from another person eating meals?: A Little Help from another person taking care of personal grooming?: A Little Help from another person toileting, which includes using toliet, bedpan, or urinal?: A Little Help from another person bathing (including washing, rinsing, drying)?: A Lot Help from another person to put on and taking off regular upper body clothing?: A Lot Help from another person to put on and taking off regular lower body clothing?: A Lot 6 Click Score: 15   End of Session Equipment Utilized During Treatment: Gait belt;Rolling walker (2 wheels) Nurse Communication: Mobility status  Activity Tolerance: Patient limited by pain Patient left: in bed;with call bell/phone within reach;with bed alarm set  OT Visit Diagnosis: Unsteadiness on feet (R26.81);Other abnormalities of gait and mobility (R26.89);Muscle weakness (generalized) (M62.81)                Time: 8663-8574 OT Time Calculation (min): 49 min Charges:  OT General Charges $OT Visit: 1 Visit OT Evaluation $OT Eval Low Complexity: 1 Low OT Treatments $Self Care/Home Management : 38-52 mins  Rogers Clause, OT/L MS[facility], 07/28/2024

## 2024-07-28 NOTE — Plan of Care (Signed)

## 2024-07-28 NOTE — Progress Notes (Signed)
 ANTICOAGULATION CONSULT NOTE  Pharmacy Consult for heparin  infusion Indication: ACS/STEMI  Allergies  Allergen Reactions   Atorvastatin Other (See Comments)    MYALGIA   Cyclobenzaprine Other (See Comments)    hallucination   Mirtazapine     Other reaction(s): Hallucination   Oxycodone-Acetaminophen  Other (See Comments)    hallucination   Paroxetine Hcl Other (See Comments)    hallucination   Penicillins Swelling    Lip and orbital swelling Tolerated 3rd generation cephalosporin (CEFTRIAXONE ) between 03/05/2023 and 03/09/2023 with no documented ADRs.   Propoxyphene Other (See Comments)    hallucination   Trazodone  Other (See Comments)    hallucination    Patient Measurements: Height: 5' 1 (154.9 cm) Weight: 59 kg (130 lb 1.6 oz) IBW/kg (Calculated) : 47.8 HEPARIN  DW (KG): 59  Vital Signs: Temp: 98.5 F (36.9 C) (11/02 2328) BP: 138/64 (11/03 0140) Pulse Rate: 79 (11/02 2328)  Labs: Recent Labs    07/26/24 2015 07/26/24 2206 07/26/24 2330 07/27/24 0329 07/27/24 0330 07/27/24 1547 07/28/24 0052  HGB 16.2*  --  13.9  --  14.1  --   --   HCT 52.2*  --  45.1  --  45.3  --   --   PLT 1,075*  --  1,090*  --  971*  --   --   LABPROT  --   --   --  17.7*  --   --   --   INR  --   --   --  1.4*  --   --   --   HEPARINUNFRC  --   --   --   --   --  0.41 0.44  CREATININE 5.46*  --   --   --  5.60*  --   --   TROPONINIHS 200* 322*  --   --   --   --   --     Estimated Creatinine Clearance: 5.1 mL/min (A) (by C-G formula based on SCr of 5.6 mg/dL (H)).   Medical History: Past Medical History:  Diagnosis Date   Anxiety    Aortic atherosclerosis    Arthritis    osteoarthritis in hands, knees ( R torn cartilage, L meniscus removed)    Atherosclerosis of native coronary artery of native heart with unstable angina pectoris (HCC)    CAD (coronary artery disease) 1999   a.) s/p PCI (details unkown) in [place]  in 1999   Campylobacter antigen positive 11/29/2014    a.) presented to [facility] ED with N/V/D; stool culture (+)   ESRD on hemodialysis (HCC)    a.) T-Th-Sat   Glaucoma    Hypercholesterolemia    Hyperlipidemia    Hypertension    Hypothyroidism    Lumbar stenosis with neurogenic claudication    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  Goal of Therapy:  Heparin  level 0.3-0.7 units/ml Monitor platelets by anticoagulation protocol: Yes  Date Time HL Rate/Comment  11/2 1547 0.41 Therapeutic x1 11/3 0052 0.44 Therapeutic x 2   Plan:  Will continue heparin  infusion at 750 units/hr Will check HL daily w/ AM labs while therapeutic  CBC  daily while on heparin   Rankin CANDIE Dills, PharmD, [facility] 07/28/2024 2:05 AM

## 2024-07-28 NOTE — Care Management Important Message (Signed)
 Important Message  Patient Details  Name: YONG WAHLQUIST MRN: [id number] Date of Birth: 1927/08/29   Important Message Given:  Yes - Medicare IM     Rojelio [name] 07/28/2024, 1:18 PM

## 2024-07-28 NOTE — Progress Notes (Signed)
 [facility]  ROUNDING NOTE   Subjective:   Cassandra [name] is a 89 year old female with past medical conditions including anxiety, hypertension, RLS, neuropathy, and end-stage renal disease on hemodialysis.  Patient presents to the emergency department with weakness and confusion and has been admitted for ESRD (end stage renal disease) on dialysis (HCC) [N18.6, Z99.2] Sepsis due to pneumonia (HCC) [J18.9, A41.9] Pneumonia due to infectious organism, unspecified laterality, unspecified part of lung [J18.9] Sepsis, due to unspecified organism, unspecified whether acute organ dysfunction present [facility]) [A41.9]  Patient is known to our practice and receives dialysis at [facility] on a TTS, supervised by Dr Marcelino.  Last treatment received on Saturday.  Patient seen sitting up in bed, alert and oriented to self.  Family at bedside.  Family states he arrived to pick patient up from treatment on Saturday.  Patient was sitting outside without a jacket on.  Family states she remained confused that afternoon.  As the evening went, patient developed some shortness of breath.  Patient was recently admitted last week and underwent stroke workup.  Labs on ED arrival unremarkable for renal patient.  Troponins 200.  Lactic acid 3.4.  White count 34.4 with hemoglobin 16.2, platelets greater than 1000.  Respiratory panel negative for influenza, COVID-19, and RSV.  Chest x-ray shows left mid to lower pneumonia and mildly displaced right 6th and 7th ribs.  CT head negative for acute findings.  We have been consulted to manage dialysis needs during this admission.  Objective:  Vital signs in last 24 hours:  Temp:  [97.9 F (36.6 C)-98.5 F (36.9 C)] 97.9 F (36.6 C) (11/03 1144) Pulse Rate:  [70-84] 77 (11/03 1144) Resp:  [17-27] 18 (11/03 1144) BP: (99-181)/(39-89) 171/69 (11/03 1144) SpO2:  [89 %-100 %] 94 % (11/03 1144)  Weight change:  Filed Weights   07/26/24 2010  Weight: 59 kg     Intake/Output: I/O last 3 completed shifts: In: 4133.7 [I.V.:1769.1; IV Piggyback:2364.5] Out: -    Intake/Output this shift:  No intake/output data recorded.  Physical Exam: General: NAD  Head: Normocephalic, atraumatic. Moist oral mucosal membranes  Eyes: Anicteric  Lungs:  Rhonchous, [place] O2  Heart: Regular rate and rhythm  Abdomen:  Soft, nontender  Extremities: Trace peripheral edema.  Neurologic: Awake, alert, oriented to self  Skin: Warm,dry, no rash  Access: Left upper aVF    Basic Metabolic Panel: Recent Labs  Lab 07/26/24 2015 07/27/24 0330  NA 135 136  K 4.8 4.6  CL 91* 97*  CO2 26 22  GLUCOSE 71 65*  BUN 20 28*  CREATININE 5.46* 5.60*  CALCIUM  8.1* 7.3*    Liver Function Tests: Recent Labs  Lab 07/26/24 2015  AST 40  ALT 18  ALKPHOS 61  BILITOT 1.1  PROT 7.5  ALBUMIN  3.0*   No results for input(s): LIPASE, AMYLASE in the last 168 hours. No results for input(s): AMMONIA in the last 168 hours.  CBC: Recent Labs  Lab 07/26/24 2015 07/26/24 2330 07/27/24 0330 07/28/24 0052  WBC 34.4* 37.3* 30.3* 29.9*  NEUTROABS 28.6* 30.4*  --   --   HGB 16.2* 13.9 14.1 13.7  HCT 52.2* 45.1 45.3 42.4  MCV 101.2* 102.3* 102.7* 99.5  PLT 1,075* 1,090* 971* 982*    Cardiac Enzymes: No results for input(s): CKTOTAL, CKMB, CKMBINDEX, TROPONINI in the last 168 hours.  BNP: Invalid input(s): POCBNP  CBG: No results for input(s): GLUCAP in the last 168 hours.  Microbiology: Results for orders placed  or performed during the hospital encounter of 07/26/24  Blood culture (routine x 2)     Status: None (Preliminary result)   Collection Time: 07/26/24  8:11 PM   Specimen: BLOOD  Result Value Ref Range Status   Specimen Description BLOOD RIGHT ANTECUBITAL  Final   Special Requests   Final    BOTTLES DRAWN AEROBIC AND ANAEROBIC Blood Culture adequate volume   Culture   Final    NO GROWTH 2 DAYS Performed at [facility],  [address], [place], [place] [address]    Report Status PENDING  Incomplete  Resp panel by RT-PCR (RSV, Flu A&B, Covid) Anterior Nasal Swab     Status: None   Collection Time: 07/26/24  8:15 PM   Specimen: Anterior Nasal Swab  Result Value Ref Range Status   SARS Coronavirus 2 by RT PCR NEGATIVE NEGATIVE Final    Comment: (NOTE) SARS-CoV-2 target nucleic acids are NOT DETECTED.  The SARS-CoV-2 RNA is generally detectable in upper respiratory specimens during the acute phase of infection. The lowest concentration of SARS-CoV-2 viral copies this assay can detect is 138 copies/mL. A negative result does not preclude SARS-Cov-2 infection and should not be used as the sole basis for treatment or other patient management decisions. A negative result may occur with  improper specimen collection/handling, submission of specimen other than nasopharyngeal swab, presence of viral mutation(s) within the areas targeted by this assay, and inadequate number of viral copies(<138 copies/mL). A negative result must be combined with clinical observations, patient history, and epidemiological information. The expected result is Negative.  Fact Sheet for Patients:  bloggercourse.com  Fact Sheet for Healthcare Providers:  seriousbroker.it  This test is no t yet approved or cleared by the [place]  FDA and  has been authorized for detection and/or diagnosis of SARS-CoV-2 by FDA under an Emergency Use Authorization (EUA). This EUA will remain  in effect (meaning this test can be used) for the duration of the COVID-19 declaration under Section 564(b)(1) of the Act, 21 U.S.C.section 360bbb-3(b)(1), unless the authorization is terminated  or revoked sooner.       Influenza A by PCR NEGATIVE NEGATIVE Final   Influenza B by PCR NEGATIVE NEGATIVE Final    Comment: (NOTE) The Xpert Xpress SARS-CoV-2/FLU/RSV plus assay is intended as an aid in the  diagnosis of influenza from Nasopharyngeal swab specimens and should not be used as a sole basis for treatment. Nasal washings and aspirates are unacceptable for Xpert Xpress SARS-CoV-2/FLU/RSV testing.  Fact Sheet for Patients: bloggercourse.com  Fact Sheet for Healthcare Providers: seriousbroker.it  This test is not yet approved or cleared by the [place]  FDA and has been authorized for detection and/or diagnosis of SARS-CoV-2 by FDA under an Emergency Use Authorization (EUA). This EUA will remain in effect (meaning this test can be used) for the duration of the COVID-19 declaration under Section 564(b)(1) of the Act, 21 U.S.C. section 360bbb-3(b)(1), unless the authorization is terminated or revoked.     Resp Syncytial Virus by PCR NEGATIVE NEGATIVE Final    Comment: (NOTE) Fact Sheet for Patients: bloggercourse.com  Fact Sheet for Healthcare Providers: seriousbroker.it  This test is not yet approved or cleared by the [place]  FDA and has been authorized for detection and/or diagnosis of SARS-CoV-2 by FDA under an Emergency Use Authorization (EUA). This EUA will remain in effect (meaning this test can be used) for the duration of the COVID-19 declaration under Section 564(b)(1) of the Act, 21 U.S.C. section 360bbb-3(b)(1),  unless the authorization is terminated or revoked.  Performed at [facility], [address] [place], [place] [address]   Blood culture (routine x 2)     Status: None (Preliminary result)   Collection Time: 07/26/24  8:16 PM   Specimen: BLOOD  Result Value Ref Range Status   Specimen Description BLOOD RIGHT ANTECUBITAL  Final   Special Requests   Final    BOTTLES DRAWN AEROBIC AND ANAEROBIC Blood Culture results may not be optimal due to an inadequate volume of blood received in culture bottles   Culture   Final    NO GROWTH 2  DAYS Performed at [facility], [address] [place], [place] [address]    Report Status PENDING  Incomplete  MRSA Next Gen by PCR, Nasal     Status: None   Collection Time: 07/27/24  5:00 PM   Specimen: Nasal Mucosa; Nasal Swab  Result Value Ref Range Status   MRSA by PCR Next Gen NOT DETECTED NOT DETECTED Final    Comment: (NOTE) The GeneXpert MRSA Assay (FDA approved for NASAL specimens only), is one component of a comprehensive MRSA colonization surveillance program. It is not intended to diagnose MRSA infection nor to guide or monitor treatment for MRSA infections. Test performance is not FDA approved in patients less than 40 years old. Performed at [facility], [address] [place], [place] [address]     Coagulation Studies: Recent Labs    07/27/24 0329  LABPROT 17.7*  INR 1.4*    Urinalysis: No results for input(s): COLORURINE, LABSPEC, PHURINE, GLUCOSEU, HGBUR, BILIRUBINUR, KETONESUR, PROTEINUR, UROBILINOGEN, NITRITE, LEUKOCYTESUR in the last 72 hours.  Invalid input(s): APPERANCEUR    Imaging: ECHOCARDIOGRAM COMPLETE Result Date: 07/27/2024    ECHOCARDIOGRAM REPORT   Patient Name:   Cassandra [name] Date of Exam: 07/27/2024 Medical Rec #:  [id number]         Height:       61.0 in Accession #:    [id number]        Weight:       130.1 lb Date of Birth:  October 23, 1926        BSA:          1.573 m Patient Age:    89 years          BP:           117/55 mmHg Patient Gender: F                 HR:           75 bpm. Exam Location:  [facility] Procedure: 2D Echo, Color Doppler and Cardiac Doppler (Both Spectral and Color            Flow Doppler were utilized during procedure). Indications:     I21.4 NSTEMI  History:         Patient has prior history of Echocardiogram examinations. CAD,                  ESRD; Risk Factors:Hypertension.  Sonographer:     L. Thornton-Maynard Referring Phys:  [id number] [facility] Diagnosing Phys: Shelda Bruckner MD  Sonographer Comments: Global longitudinal strain was attempted. IMPRESSIONS  1. Left ventricular ejection fraction, by estimation, is 65 to 70%. The left ventricle has normal function. The left ventricle has no regional wall motion abnormalities. There is mild concentric left ventricular hypertrophy. Left ventricular diastolic parameters are consistent with Grade I diastolic dysfunction (impaired relaxation).  The average left ventricular global longitudinal strain is -18.6 %. The global longitudinal strain is normal.  2. Right ventricular systolic function is normal. The right ventricular size is normal. There is moderately elevated pulmonary artery systolic pressure. The estimated right ventricular systolic pressure is 59.9 mmHg.  3. Right atrial size was mildly dilated.  4. The mitral valve is normal in structure. Trivial mitral valve regurgitation. No evidence of mitral stenosis.  5. Tricuspid valve regurgitation is moderate.  6. The aortic valve is tricuspid. There is moderate calcification of the aortic valve. Aortic valve regurgitation is mild. Aortic valve sclerosis/calcification is present, without any evidence of aortic stenosis.  7. There is Moderate (Grade III) plaque involving the aortic arch and descending aorta.  8. The inferior vena cava is normal in size with greater than 50% respiratory variability, suggesting right atrial pressure of 3 mmHg. Comparison(s): Changes from prior study are noted. TR now moderate, elevated RVSP. FINDINGS  Left Ventricle: Left ventricular ejection fraction, by estimation, is 65 to 70%. The left ventricle has normal function. The left ventricle has no regional wall motion abnormalities. The average left ventricular global longitudinal strain is -18.6 %. Strain was performed and the global longitudinal strain is normal. The left ventricular internal cavity size was normal in size. There is mild concentric left ventricular hypertrophy. Left ventricular  diastolic parameters are consistent with Grade I diastolic dysfunction (impaired relaxation). Right Ventricle: The right ventricular size is normal. No increase in right ventricular wall thickness. Right ventricular systolic function is normal. There is moderately elevated pulmonary artery systolic pressure. The tricuspid regurgitant velocity is 3.77 m/s, and with an assumed right atrial pressure of 3 mmHg, the estimated right ventricular systolic pressure is 59.9 mmHg. Left Atrium: Left atrial size was normal in size. Right Atrium: Right atrial size was mildly dilated. Pericardium: There is no evidence of pericardial effusion. Mitral Valve: The mitral valve is normal in structure. Trivial mitral valve regurgitation. No evidence of mitral valve stenosis. Tricuspid Valve: The tricuspid valve is normal in structure. Tricuspid valve regurgitation is moderate . No evidence of tricuspid stenosis. Aortic Valve: Focal moderate calcification of noncoronary cusp. The aortic valve is tricuspid. There is moderate calcification of the aortic valve. Aortic valve regurgitation is mild. Aortic regurgitation PHT measures 474 msec. Aortic valve sclerosis/calcification is present, without any evidence of aortic stenosis. Aortic valve mean gradient measures 9.0 mmHg. Aortic valve peak gradient measures 16.6 mmHg. Aortic valve area, by VTI measures 1.89 cm. Pulmonic Valve: The pulmonic valve was not well visualized. Pulmonic valve regurgitation is trivial. No evidence of pulmonic stenosis. Aorta: The aortic root and ascending aorta are structurally normal, with no evidence of dilitation. There is moderate (Grade III) plaque involving the aortic arch and descending aorta. Venous: The inferior vena cava is normal in size with greater than 50% respiratory variability, suggesting right atrial pressure of 3 mmHg. IAS/Shunts: The atrial septum is grossly normal.  LEFT VENTRICLE PLAX 2D LVIDd:         4.20 cm     Diastology LVIDs:          2.70 cm     LV e' medial:    5.33 cm/s LV PW:         1.40 cm     LV E/e' medial:  18.0 LV IVS:        1.40 cm     LV e' lateral:   6.20 cm/s LVOT diam:     2.00 cm  LV E/e' lateral: 15.5 LV SV:         76 LV SV Index:   49          2D Longitudinal Strain LVOT Area:     3.14 cm    2D Strain GLS (A4C):   -17.8 % LV IVRT:       77 msec     2D Strain GLS (A3C):   -21.6 %                            2D Strain GLS (A2C):   -16.5 %                            2D Strain GLS Avg:     -18.6 % LV Volumes (MOD) LV vol d, MOD A2C: 68.6 ml LV vol d, MOD A4C: 35.6 ml LV vol s, MOD A2C: 12.7 ml LV vol s, MOD A4C: 9.8 ml LV SV MOD A2C:     55.9 ml LV SV MOD A4C:     35.6 ml LV SV MOD BP:      38.9 ml RIGHT VENTRICLE             IVC RV Basal diam:  3.93 cm     IVC diam: 1.40 cm RV Mid diam:    3.00 cm RV S prime:     12.90 cm/s  PULMONARY VEINS TAPSE (M-mode): 2.6 cm      Diastolic Velocity: 51.60 cm/s                             S/D Velocity:       1.40                             Systolic Velocity:  73.90 cm/s LEFT ATRIUM             Index        RIGHT ATRIUM           Index LA diam:        2.80 cm 1.78 cm/m   RA Area:     18.00 cm LA Vol (A2C):   57.1 ml 36.29 ml/m  RA Volume:   49.60 ml  31.53 ml/m LA Vol (A4C):   32.4 ml 20.59 ml/m LA Biplane Vol: 43.4 ml 27.59 ml/m  AORTIC VALVE                     PULMONIC VALVE AV Area (Vmax):    1.63 cm      PV Vmax:          1.00 m/s AV Area (Vmean):   1.78 cm      PV Peak grad:     4.0 mmHg AV Area (VTI):     1.89 cm      PR End Diast Vel: 4.30 msec AV Vmax:           204.00 cm/s AV Vmean:          140.500 cm/s AV VTI:            0.403 m AV Peak Grad:      16.6 mmHg AV Mean Grad:      9.0 mmHg LVOT Vmax:         106.00 cm/s LVOT  Vmean:        79.700 cm/s LVOT VTI:          0.243 m LVOT/AV VTI ratio: 0.60 AI PHT:            474 msec  AORTA Ao Root diam: 3.00 cm Ao Asc diam:  3.30 cm MITRAL VALVE                TRICUSPID VALVE MV Area (PHT): 3.31 cm     TR Peak grad:   56.9 mmHg  MV Decel Time: 229 msec     TR Vmax:        377.00 cm/s MV E velocity: 95.90 cm/s MV A velocity: 106.00 cm/s  SHUNTS MV E/A ratio:  0.90         Systemic VTI:  0.24 m                             Systemic Diam: 2.00 cm Shelda Bruckner MD Electronically signed by Shelda Bruckner MD Signature Date/Time: 07/27/2024/2:52:08 PM    Final    CT CHEST ABDOMEN PELVIS WO CONTRAST Result Date: 07/26/2024 EXAM: CT CHEST, ABDOMEN AND PELVIS WITHOUT CONTRAST 03/05/2023 09:23:51 PM TECHNIQUE: CT of the chest, abdomen and pelvis was performed without the administration of intravenous contrast. Multiplanar reformatted images are provided for review. Automated exposure control, iterative reconstruction, and/or weight based adjustment of the mA/kV was utilized to reduce the radiation dose to as low as reasonably achievable. COMPARISON: None available. CLINICAL HISTORY: Sepsis. FINDINGS: CHEST: MEDIASTINUM AND LYMPH NODES: Mild cardiomegaly. Diffuse coronary artery and aortic atherosclerosis. The central airways are clear. No mediastinal, hilar or axillary lymphadenopathy. LUNGS AND PLEURA: Consolidation in the left lower lobe and lingula and to a lesser extent right lower lobe compatible with pneumonia. No pleural effusion or pneumothorax. ABDOMEN AND PELVIS: LIVER: The liver is unremarkable. GALLBLADDER AND BILE DUCTS: Small layering gallstones within the gallbladder. No evidence of acute cholecystitis. No biliary ductal dilatation. SPLEEN: No acute abnormality. PANCREAS: No acute abnormality. ADRENAL GLANDS: No acute abnormality. KIDNEYS, URETERS AND BLADDER: Atrophic kidneys bilaterally. Left renal mid pole cyst measures 3.5 cm. Per consensus, no follow-up is needed for simple Bosniak type 1 and 2 renal cysts, unless the patient has a malignancy history or risk factors. The urinary bladder is decompressed. No stones in the kidneys or ureters. No hydronephrosis. No perinephric or periureteral stranding. GI AND BOWEL:  Stomach demonstrates no acute abnormality. Postoperative changes in the rectosigmoid colon. Scattered colonic diverticulosis, most pronounced in the left colon. There is no bowel obstruction. REPRODUCTIVE ORGANS: Prior hysterectomy. PERITONEUM AND RETROPERITONEUM: No ascites. No free air. VASCULATURE: Aorta is normal in caliber. ABDOMINAL AND PELVIS LYMPH NODES: No lymphadenopathy. BONES AND SOFT TISSUES: Diffuse degenerative disc and facet disease throughout the thoracolumbar spine. No focal soft tissue abnormality. IMPRESSION: 1. Multifocal consolidation greatest in the left lower lobe and lingula, compatible with pneumonia. 2. Cholelithiasis without acute cholecystitis. 3. Bilateral renal atrophy. 4. Colonic diverticulosis. 5. Aortic atherosclerosis. Coronary artery disease. Electronically signed by: Franky Crease MD 07/26/2024 09:37 PM EDT RP Workstation: [id number]   CT HEAD WO CONTRAST ( ) Result Date: 07/26/2024 EXAM: CT HEAD WITHOUT CONTRAST 07/26/2024 09:23:51 PM TECHNIQUE: CT of the head was performed without the administration of intravenous contrast. Automated exposure control, iterative reconstruction, and/or weight based adjustment of the mA/kV was utilized to reduce the radiation dose to as low as reasonably achievable. COMPARISON: 05/02/2022 CLINICAL HISTORY: Headache, new onset (  Age >= 51y) FINDINGS: BRAIN AND VENTRICLES: No acute hemorrhage. No evidence of acute infarct. No hydrocephalus. No extra-axial collection. No mass effect or midline shift. There is diffuse cerebral atrophy and chronic small vessel disease throughout the deep white matter. ORBITS: No acute abnormality. SINUSES: No acute abnormality. SOFT TISSUES AND SKULL: No acute soft tissue abnormality. No skull fracture. IMPRESSION: 1. No acute intracranial abnormality. 2. Diffuse cerebral atrophy and chronic small vessel disease throughout the deep white matter. Electronically signed by: Franky Crease MD 07/26/2024 09:33 PM EDT RP  Workstation: [id number]   DG Chest Portable 1 View Result Date: 07/26/2024 EXAM: 1 VIEW(S) XRAY OF THE CHEST 07/26/2024 08:43:00 PM COMPARISON: Portable chest 04/08/2024. CLINICAL HISTORY: Chest pain and lethargy with hypoxemia. Symptom onset following dialysis this morning. FINDINGS: LINES, TUBES AND DEVICES: An electronic device is superimposed over the chest centrally, and there are overlying telemetry leads. LUNGS AND PLEURA: There is dense airspace disease in the left mid to lower lung fields predominating in the periphery, most likely a consolidated pneumonia likely in the lingula as there is obscuration of the lower left heart border. There is a small pleural effusion, most likely a parapneumonic effusion. Rest of the lungs are clear of infiltrates with chronic interstitial changes. No pneumothorax. HEART AND MEDIASTINUM: The aorta is tortuous and calcified, stable mediastinum. There is mild cardiomegaly but no evidence for CHF. BONES AND SOFT TISSUES: Osteopenia. There are new mildly displaced posterolateral fractures of the right 6th and 7th ribs, age indeterminate but probably subacute with suggestion of partial healing. DISCS/DEGENERATIVE CHANGES: Moderate thoracic spondylosis. IMPRESSION: 1. Dense left mid to lower lung airspace disease most consistent with lingular pneumonia, with a small likely parapneumonic effusion. Follow-up study recommended to ensure clearing. 2. New mildly displaced posterolateral fractures of the right 6th and 7th ribs, age indeterminate but possibly subacute. Electronically signed by: Francis Quam MD 07/26/2024 08:53 PM EDT RP Workstation: [id number]     Medications:    azithromycin  Stopped (07/28/24 0043)   heparin  750 Units/hr (07/28/24 1108)   piperacillin-tazobactam (ZOSYN)  IV 2.25 g (07/27/24 1708)    aspirin  EC  81 mg Oral Daily   calcium  acetate  1,334 mg Oral TID WC   clopidogrel  75 mg Oral Daily   feeding supplement (NEPRO CARB STEADY)  237 mL Oral  BID BM   gabapentin   100 mg Oral BID   And   gabapentin   200 mg Oral QHS   guaiFENesin  600 mg Oral BID   hydrocortisone sod succinate (SOLU-CORTEF) inj  100 mg Intravenous Q12H   hydroxyurea   300 mg Oral Daily   ipratropium-albuterol   3 mL Nebulization TID   levothyroxine   88 mcg Oral Q0600   rOPINIRole   0.5 mg Oral BID   rosuvastatin   10 mg Oral Daily   sertraline  50 mg Oral Daily   traZODone   25-50 mg Oral QHS   acetaminophen  **OR** acetaminophen , ALPRAZolam , diphenhydrAMINE , hydrALAZINE , HYDROcodone -acetaminophen , hydrOXYzine, lidocaine , loperamide, magnesium hydroxide, ondansetron  **OR** ondansetron  (ZOFRAN ) IV  Assessment/ Plan:  [name] is a 89 y.o.  female with past medical conditions including anxiety, hypertension, RLS, neuropathy, and end-stage renal disease on hemodialysis.  Patient presents to the emergency department with weakness and confusion and has been admitted for ESRD (end stage renal disease) on dialysis (HCC) [N18.6, Z99.2] Sepsis due to pneumonia (HCC) [J18.9, A41.9] Pneumonia due to infectious organism, unspecified laterality, unspecified part of lung [J18.9] Sepsis, due to unspecified organism, unspecified whether acute organ dysfunction present [facility]) [  A41.9]  [facility]/TTS/left aVF   End-stage renal disease on hemodialysis.  Last treatment received on Saturday. No acute need for dialysis today. Will plan to perform dialysis tomorrow.   2. Anemia of chronic kidney disease Lab Results  Component Value Date   HGB 13.7 07/28/2024    Hemoglobin acceptable  3. Secondary Hyperparathyroidism: with outpatient labs: None available Lab Results  Component Value Date   CALCIUM  7.3 (L) 07/27/2024   CAION 1.11 (L) 05/25/2023   PHOS 7.0 (H) 07/17/2024    Will continue to monitor bone minerals during this admission. Continue calcium  acetate with meals to manage phosphorus.  4. Hypertension with chronic kidney disease. Home regimen  includes amlodipine  and clonidine . Both currently held   LOS: 2 Davi Rotan 11/3/202512:34 PM

## 2024-07-28 NOTE — Evaluation (Signed)
 Clinical/Bedside Swallow Evaluation Patient Details  Name: Cassandra [name] MRN: [id number] Date of Birth: [birth year]  Today's Date: 07/28/2024 Time: SLP Start Time (ACUTE ONLY): 0900 SLP Stop Time (ACUTE ONLY): 0940 SLP Time Calculation (min) (ACUTE ONLY): 40 min  Past Medical History:  Past Medical History:  Diagnosis Date   Anxiety    Aortic atherosclerosis    Arthritis    osteoarthritis in hands, knees ( R torn cartilage, L meniscus removed)    Atherosclerosis of native coronary artery of native heart with unstable angina pectoris (HCC)    CAD (coronary artery disease) 1999   a.) s/p PCI (details unkown) in [place]  in 1999   Campylobacter antigen positive 11/29/2014   a.) presented to [facility] [place] ED with N/V/D; stool culture (+)   ESRD on hemodialysis (HCC)    a.) T-Th-Sat   Glaucoma    Hypercholesterolemia    Hyperlipidemia    Hypertension    Hypothyroidism    Lumbar stenosis with neurogenic claudication    Perforation of colon as colonoscopy complication (HCC) 2010   a.) required short term colostomy with subsequent take down/reversal   RLS (restless legs syndrome)    Thrombocytosis    Type 2 diabetes mellitus with chronic kidney disease (HCC)    Past Surgical History:  Past Surgical History:  Procedure Laterality Date   A/V FISTULAGRAM Left 12/25/2023   Procedure: A/V Fistulagram;  Surgeon: Jama Cordella MATSU, MD;  Location: [facility];  Service: Cardiovascular;  Laterality: Left;   ABDOMINAL HYSTERECTOMY     left ovary removed   AV FISTULA PLACEMENT Left 05/25/2023   Procedure: ARTERIOVENOUS (AV) FISTULA CREATION (BRACHIALCEPHALIC);  Surgeon: Jama Cordella MATSU, MD;  Location: [facility];  Service: Vascular;  Laterality: Left;   BLEPHAROPLASTY Bilateral    CATARACT EXTRACTION W/ INTRAOCULAR LENS  IMPLANT, BILATERAL     COLONOSCOPY  2010   COLOSTOMY  03/2009   perforated colon from colonoscopy   COLOSTOMY REVERSAL  08/2009   CORONARY ANGIOPLASTY WITH STENT  PLACEMENT  1999   DIALYSIS/PERMA CATHETER INSERTION N/A 03/08/2023   Procedure: DIALYSIS/PERMA CATHETER INSERTION;  Surgeon: Marea Selinda RAMAN, MD;  Location: [facility];  Service: Cardiovascular;  Laterality: N/A;   DIALYSIS/PERMA CATHETER REMOVAL N/A 09/05/2023   Procedure: DIALYSIS/PERMA CATHETER REMOVAL;  Surgeon: Marea Selinda RAMAN, MD;  Location: [facility];  Service: Cardiovascular;  Laterality: N/A;   FOOT SURGERY Bilateral    toes correction   KNEE ARTHROSCOPY Right    torn cartilage   KNEE ARTHROSCOPY W/ MENISCECTOMY Left 01/2012   TEMPORARY DIALYSIS CATHETER N/A 03/05/2023   Procedure: TEMPORARY DIALYSIS CATHETER;  Surgeon: Marea Selinda RAMAN, MD;  Location: [facility];  Service: Cardiovascular;  Laterality: N/A;   HPI:  Per H&P, Cassandra [name] is a 89 y.o. female with medical history significant for coronary artery disease, end-stage renal disease on hemodialysis on TTS, hypertension, dyslipidemia, hypothyroidism, lumbar stenosis, RLS and type 2 diabetes mellitus, who presented to the emergency room with acute onset of fatigue and generalized weakness with altered mental status and lethargy. CT Chest: Consolidation in the left lower lobe and lingula and to a lesser extent right  lower lobe compatible with pneumonia. No pleural effusion or pneumothorax.    Assessment / Plan / Recommendation  Clinical Impression  Pt seen for bedside swallow evaluation in the setting of pt report of coughing/globus with medication administration and admission for sepsis d/t PNA. No history of dysphagia noted in chart. Pt/spouse reporting  pill dysphagia is consistent with baseline. Upon therapist entrance, pt on 13L HFNC. Even respirations. Congested cough noted, pt reporting persistent cough with production during admission. Oral motor function grossly intact. Limited natural dentition- dentures not present in the hospital.   Trials completed of thin liquid via cup/straw and puree, with  pt self feeding with assist for set up. No overt or subtle s/sx pharyngeal dysphagia noted. No change to vocal quality across trials. Oral phase functional for trials completed. Noted increase in WOB with repeated trials, with pt self monitoring with rest every 2-3 bites. To avoid further exertion, advanced solids not trialed. Education shared regarding connection between respiratory status and swallow safety, aspiration precautions, risk factors for aspiration, and methods for medication administration to reduce globus sensation. All reported understanding.   Given age 89, current respiratory/pulmonary status, and history of GERD (pt reported), pt is at increased risk for aspiration. Therefore, recommend strict aspiration precautions (slow rate, small bites, elevated HOB, alert for PO intake). Also, esophageal precautions, (smaller more frequent meals, follow bites of food with liquids, remain upright for 30-60 minutes after eating, do not eat/drink 2-3 hours before bedtime except water, elevate head of bed at least 30 degrees). Rest breaks t/o intake to aid respiratory function. Thin liquids and puree solids (to limit exertion and in the setting of minimal dentition). Medications whole in puree to facilitate comfort/clearance. RN and MD aware of recommendations. SLP will continue to follow.      SLP Visit Diagnosis: Dysphagia, unspecified (R13.10)    Aspiration Risk  Moderate aspiration risk    Diet Recommendation   Thin;Dysphagia 1 (puree)  Medication Administration: Whole meds with puree    Other  Recommendations Recommended Consults: Consider GI evaluation (if blobus sensation/GERD persists/worsens) Oral Care Recommendations: Oral care BID     Assistance Recommended at Discharge  Intermittent supervision for PO intake  Functional Status Assessment Patient has had a recent decline in their functional status and demonstrates the ability to make significant improvements in function in a reasonable  and predictable amount of time.  Frequency and Duration min 2x/week  2 weeks       Prognosis Prognosis for improved oropharyngeal function: Good Barriers to Reach Goals:  (none)      Swallow Study   General Date of Onset: 07/28/24 HPI: Per H&P, ANAIZ QAZI is a 89 y.o. female with medical history significant for coronary artery disease, end-stage renal disease on hemodialysis on TTS, hypertension, dyslipidemia, hypothyroidism, lumbar stenosis, RLS and type 2 diabetes mellitus, who presented to the emergency room with acute onset of fatigue and generalized weakness with altered mental status and lethargy. CT Chest: Consolidation in the left lower lobe and lingula and to a lesser extent right  lower lobe compatible with pneumonia. No pleural effusion or pneumothorax. Type of Study: Bedside Swallow Evaluation Previous Swallow Assessment: none in chart Diet Prior to this Study: NPO Temperature Spikes Noted: No (WBC 29.9) Respiratory Status: Nasal cannula (13L) History of Recent Intubation: No Behavior/Cognition: Alert;Pleasant mood;Cooperative Oral Cavity Assessment: Within Functional Limits (lingual surface reddened) Oral Care Completed by SLP: Yes Oral Cavity - Dentition: Missing dentition (dentures not present in the room- at home) Vision: Functional for self-feeding Self-Feeding Abilities: Able to feed self;Needs set up Patient Positioning: Upright in bed Baseline Vocal Quality: Normal Volitional Cough: Congested Volitional Swallow: Able to elicit    Oral/Motor/Sensory Function Overall Oral Motor/Sensory Function: Within functional limits   Ice Chips Ice chips: Not tested   Thin Liquid Thin Liquid: Within  functional limits Presentation: Cup;Straw    Nectar Thick Nectar Thick Liquid: Not tested   Honey Thick Honey Thick Liquid: Not tested   Puree Puree: Within functional limits Presentation: Self Fed;Spoon   Solid     Solid: Not tested     Yahira Timberman Clapp,  MS, CCC-SLP Speech Language Pathologist Rehab Services; [facility] [place] [phone] (ascom)   Waddell Iten J Clapp 07/28/2024,11:05 AM

## 2024-07-28 NOTE — Progress Notes (Signed)
 [facility] CARDIOLOGY PROGRESS NOTE   Patient ID: Cassandra [name] MRN: [id number] DOB/AGE: [birth year] 88 y.o.  Admit date: 07/26/2024 Referring Physician Dr. Madison Peaches Primary Physician Cleotilde Oneil FALCON, MD  Primary Cardiologist Dr. Wilburn Reason for Consultation Elevated troponin  HPI: Cassandra [name] is a 89 y.o. female with a past medical history of coronary artery disease s/p PCI in 1999, aortic atherosclerosis, type 2 diabetes, ESRD on HD, hypertension, and hyperlipidemia who presented to the ED on 07/26/2024 for fatigue, weakness. Cardiology was consulted for further evaluation.   Interval History:  -Patient seen and examined this AM, resting comfortably in hospital bed. -Feels breathing is slightly better today, remains on supplemental O2.  -Denies CP, palpitations. No events noted on tele.  -BP and HR stable.   Review of systems complete and found to be negative unless listed above   Vitals:   07/28/24 0330 07/28/24 0500 07/28/24 0720 07/28/24 0726  BP: (!) 107/56  (!) 172/71 (!) 163/74  Pulse: 74  82   Resp: (!) 22  20   Temp: 97.9 F (36.6 C)  98 F (36.7 C)   TempSrc:   Oral   SpO2: 96% 94% 92%   Weight:      Height:         Intake/Output Summary (Last 24 hours) at 07/28/2024 0830 Last data filed at 07/28/2024 0500 Gross per 24 hour  Intake 2071.17 ml  Output --  Net 2071.17 ml     PHYSICAL EXAM General: Chronically ill appearing elderly female, well nourished, in no acute distress. HEENT: Normocephalic and atraumatic. Neck: No JVD.  Lungs: Normal respiratory effort on HF [place]. Clear bilaterally to auscultation. No wheezes, crackles, rhonchi.  Heart: HRRR. Normal S1 and S2 without gallops or murmurs. Radial & DP pulses 2+ bilaterally. Abdomen: Non-distended appearing.  Msk: Normal strength and tone for age. Extremities: No clubbing, cyanosis or edema.   Neuro: Alert and oriented X 3. Psych: Mood appropriate, affect congruent.    LABS: Basic  Metabolic Panel: Recent Labs    07/26/24 2015 07/27/24 0330  NA 135 136  K 4.8 4.6  CL 91* 97*  CO2 26 22  GLUCOSE 71 65*  BUN 20 28*  CREATININE 5.46* 5.60*  CALCIUM  8.1* 7.3*   Liver Function Tests: Recent Labs    07/26/24 2015  AST 40  ALT 18  ALKPHOS 61  BILITOT 1.1  PROT 7.5  ALBUMIN  3.0*   No results for input(s): LIPASE, AMYLASE in the last 72 hours. CBC: Recent Labs    07/26/24 2015 07/26/24 2330 07/27/24 0330 07/28/24 0052  WBC 34.4* 37.3* 30.3* 29.9*  NEUTROABS 28.6* 30.4*  --   --   HGB 16.2* 13.9 14.1 13.7  HCT 52.2* 45.1 45.3 42.4  MCV 101.2* 102.3* 102.7* 99.5  PLT 1,075* 1,090* 971* 982*   Cardiac Enzymes: Recent Labs    07/26/24 2015 07/26/24 2206  TROPONINIHS 200* 322*   BNP: No results for input(s): BNP in the last 72 hours. D-Dimer: No results for input(s): DDIMER in the last 72 hours. Hemoglobin A1C: No results for input(s): HGBA1C in the last 72 hours. Fasting Lipid Panel: No results for input(s): CHOL, HDL, LDLCALC, TRIG, CHOLHDL, LDLDIRECT in the last 72 hours. Thyroid Function Tests: No results for input(s): TSH, T4TOTAL, T3FREE, THYROIDAB in the last 72 hours.  Invalid input(s): FREET3 Anemia Panel: No results for input(s): VITAMINB12, FOLATE, FERRITIN, TIBC, IRON, RETICCTPCT in the last 72 hours.  ECHOCARDIOGRAM COMPLETE Result Date: 07/27/2024  ECHOCARDIOGRAM REPORT   Patient Name:   Cassandra [name] Burgio Date of Exam: 07/27/2024 Medical Rec #:  [id number]         Height:       61.0 in Accession #:    [id number]        Weight:       130.1 lb Date of Birth:  15-Sep-1927        BSA:          1.573 m Patient Age:    89 years          BP:           117/55 mmHg Patient Gender: F                 HR:           75 bpm. Exam Location:  [facility] Procedure: 2D Echo, Color Doppler and Cardiac Doppler (Both Spectral and Color            Flow Doppler were utilized during procedure). Indications:     I21.4  NSTEMI  History:         Patient has prior history of Echocardiogram examinations. CAD,                  ESRD; Risk Factors:Hypertension.  Sonographer:     L. Thornton-Maynard Referring Phys:  [id number] [facility]AD[facility] [employer] MANSY Diagnosing Phys: Shelda Bruckner MD  Sonographer Comments: Global longitudinal strain was attempted. IMPRESSIONS  1. Left ventricular ejection fraction, by estimation, is 65 to 70%. The left ventricle has normal function. The left ventricle has no regional wall motion abnormalities. There is mild concentric left ventricular hypertrophy. Left ventricular diastolic parameters are consistent with Grade I diastolic dysfunction (impaired relaxation). The average left ventricular global longitudinal strain is -18.6 %. The global longitudinal strain is normal.  2. Right ventricular systolic function is normal. The right ventricular size is normal. There is moderately elevated pulmonary artery systolic pressure. The estimated right ventricular systolic pressure is 59.9 mmHg.  3. Right atrial size was mildly dilated.  4. The mitral valve is normal in structure. Trivial mitral valve regurgitation. No evidence of mitral stenosis.  5. Tricuspid valve regurgitation is moderate.  6. The aortic valve is tricuspid. There is moderate calcification of the aortic valve. Aortic valve regurgitation is mild. Aortic valve sclerosis/calcification is present, without any evidence of aortic stenosis.  7. There is Moderate (Grade III) plaque involving the aortic arch and descending aorta.  8. The inferior vena cava is normal in size with greater than 50% respiratory variability, suggesting right atrial pressure of 3 mmHg. Comparison(s): Changes from prior study are noted. TR now moderate, elevated RVSP. FINDINGS  Left Ventricle: Left ventricular ejection fraction, by estimation, is 65 to 70%. The left ventricle has normal function. The left ventricle has no regional wall motion abnormalities. The average left ventricular global  longitudinal strain is -18.6 %. Strain was performed and the global longitudinal strain is normal. The left ventricular internal cavity size was normal in size. There is mild concentric left ventricular hypertrophy. Left ventricular diastolic parameters are consistent with Grade I diastolic dysfunction (impaired relaxation). Right Ventricle: The right ventricular size is normal. No increase in right ventricular wall thickness. Right ventricular systolic function is normal. There is moderately elevated pulmonary artery systolic pressure. The tricuspid regurgitant velocity is 3.77 m/s, and with an assumed right atrial pressure of 3 mmHg, the estimated right ventricular systolic pressure is 59.9 mmHg. Left Atrium: Left atrial size  was normal in size. Right Atrium: Right atrial size was mildly dilated. Pericardium: There is no evidence of pericardial effusion. Mitral Valve: The mitral valve is normal in structure. Trivial mitral valve regurgitation. No evidence of mitral valve stenosis. Tricuspid Valve: The tricuspid valve is normal in structure. Tricuspid valve regurgitation is moderate . No evidence of tricuspid stenosis. Aortic Valve: Focal moderate calcification of noncoronary cusp. The aortic valve is tricuspid. There is moderate calcification of the aortic valve. Aortic valve regurgitation is mild. Aortic regurgitation PHT measures 474 msec. Aortic valve sclerosis/calcification is present, without any evidence of aortic stenosis. Aortic valve mean gradient measures 9.0 mmHg. Aortic valve peak gradient measures 16.6 mmHg. Aortic valve area, by VTI measures 1.89 cm. Pulmonic Valve: The pulmonic valve was not well visualized. Pulmonic valve regurgitation is trivial. No evidence of pulmonic stenosis. Aorta: The aortic root and ascending aorta are structurally normal, with no evidence of dilitation. There is moderate (Grade III) plaque involving the aortic arch and descending aorta. Venous: The inferior vena cava is  normal in size with greater than 50% respiratory variability, suggesting right atrial pressure of 3 mmHg. IAS/Shunts: The atrial septum is grossly normal.  LEFT VENTRICLE PLAX 2D LVIDd:         4.20 cm     Diastology LVIDs:         2.70 cm     LV e' medial:    5.33 cm/s LV PW:         1.40 cm     LV E/e' medial:  18.0 LV IVS:        1.40 cm     LV e' lateral:   6.20 cm/s LVOT diam:     2.00 cm     LV E/e' lateral: 15.5 LV SV:         76 LV SV Index:   49          2D Longitudinal Strain LVOT Area:     3.14 cm    2D Strain GLS (A4C):   -17.8 % LV IVRT:       77 msec     2D Strain GLS (A3C):   -21.6 %                            2D Strain GLS (A2C):   -16.5 %                            2D Strain GLS Avg:     -18.6 % LV Volumes (MOD) LV vol d, MOD A2C: 68.6 ml LV vol d, MOD A4C: 35.6 ml LV vol s, MOD A2C: 12.7 ml LV vol s, MOD A4C: 9.8 ml LV SV MOD A2C:     55.9 ml LV SV MOD A4C:     35.6 ml LV SV MOD BP:      38.9 ml RIGHT VENTRICLE             IVC RV Basal diam:  3.93 cm     IVC diam: 1.40 cm RV Mid diam:    3.00 cm RV S prime:     12.90 cm/s  PULMONARY VEINS TAPSE (M-mode): 2.6 cm      Diastolic Velocity: 51.60 cm/s                             S/D Velocity:  1.40                             Systolic Velocity:  73.90 cm/s LEFT ATRIUM             Index        RIGHT ATRIUM           Index LA diam:        2.80 cm 1.78 cm/m   RA Area:     18.00 cm LA Vol (A2C):   57.1 ml 36.29 ml/m  RA Volume:   49.60 ml  31.53 ml/m LA Vol (A4C):   32.4 ml 20.59 ml/m LA Biplane Vol: 43.4 ml 27.59 ml/m  AORTIC VALVE                     PULMONIC VALVE AV Area (Vmax):    1.63 cm      PV Vmax:          1.00 m/s AV Area (Vmean):   1.78 cm      PV Peak grad:     4.0 mmHg AV Area (VTI):     1.89 cm      PR End Diast Vel: 4.30 msec AV Vmax:           204.00 cm/s AV Vmean:          140.500 cm/s AV VTI:            0.403 m AV Peak Grad:      16.6 mmHg AV Mean Grad:      9.0 mmHg LVOT Vmax:         106.00 cm/s LVOT Vmean:        79.700  cm/s LVOT VTI:          0.243 m LVOT/AV VTI ratio: 0.60 AI PHT:            474 msec  AORTA Ao Root diam: 3.00 cm Ao Asc diam:  3.30 cm MITRAL VALVE                TRICUSPID VALVE MV Area (PHT): 3.31 cm     TR Peak grad:   56.9 mmHg MV Decel Time: 229 msec     TR Vmax:        377.00 cm/s MV E velocity: 95.90 cm/s MV A velocity: 106.00 cm/s  SHUNTS MV E/A ratio:  0.90         Systemic VTI:  0.24 m                             Systemic Diam: 2.00 cm Shelda Bruckner MD Electronically signed by Shelda Bruckner MD Signature Date/Time: 07/27/2024/2:52:08 PM    Final    CT CHEST ABDOMEN PELVIS WO CONTRAST Result Date: 07/26/2024 EXAM: CT CHEST, ABDOMEN AND PELVIS WITHOUT CONTRAST 03/05/2023 09:23:51 PM TECHNIQUE: CT of the chest, abdomen and pelvis was performed without the administration of intravenous contrast. Multiplanar reformatted images are provided for review. Automated exposure control, iterative reconstruction, and/or weight based adjustment of the mA/kV was utilized to reduce the radiation dose to as low as reasonably achievable. COMPARISON: None available. CLINICAL HISTORY: Sepsis. FINDINGS: CHEST: MEDIASTINUM AND LYMPH NODES: Mild cardiomegaly. Diffuse coronary artery and aortic atherosclerosis. The central airways are clear. No mediastinal, hilar or axillary lymphadenopathy. LUNGS AND PLEURA: Consolidation in the left lower lobe and lingula and to a  lesser extent right lower lobe compatible with pneumonia. No pleural effusion or pneumothorax. ABDOMEN AND PELVIS: LIVER: The liver is unremarkable. GALLBLADDER AND BILE DUCTS: Small layering gallstones within the gallbladder. No evidence of acute cholecystitis. No biliary ductal dilatation. SPLEEN: No acute abnormality. PANCREAS: No acute abnormality. ADRENAL GLANDS: No acute abnormality. KIDNEYS, URETERS AND BLADDER: Atrophic kidneys bilaterally. Left renal mid pole cyst measures 3.5 cm. Per consensus, no follow-up is needed for simple Bosniak type  1 and 2 renal cysts, unless the patient has a malignancy history or risk factors. The urinary bladder is decompressed. No stones in the kidneys or ureters. No hydronephrosis. No perinephric or periureteral stranding. GI AND BOWEL: Stomach demonstrates no acute abnormality. Postoperative changes in the rectosigmoid colon. Scattered colonic diverticulosis, most pronounced in the left colon. There is no bowel obstruction. REPRODUCTIVE ORGANS: Prior hysterectomy. PERITONEUM AND RETROPERITONEUM: No ascites. No free air. VASCULATURE: Aorta is normal in caliber. ABDOMINAL AND PELVIS LYMPH NODES: No lymphadenopathy. BONES AND SOFT TISSUES: Diffuse degenerative disc and facet disease throughout the thoracolumbar spine. No focal soft tissue abnormality. IMPRESSION: 1. Multifocal consolidation greatest in the left lower lobe and lingula, compatible with pneumonia. 2. Cholelithiasis without acute cholecystitis. 3. Bilateral renal atrophy. 4. Colonic diverticulosis. 5. Aortic atherosclerosis. Coronary artery disease. Electronically signed by: Franky Crease MD 07/26/2024 09:37 PM EDT RP Workstation: [id number]   CT HEAD WO CONTRAST ( ) Result Date: 07/26/2024 EXAM: CT HEAD WITHOUT CONTRAST 07/26/2024 09:23:51 PM TECHNIQUE: CT of the head was performed without the administration of intravenous contrast. Automated exposure control, iterative reconstruction, and/or weight based adjustment of the mA/kV was utilized to reduce the radiation dose to as low as reasonably achievable. COMPARISON: 05/02/2022 CLINICAL HISTORY: Headache, new onset (Age >= 51y) FINDINGS: BRAIN AND VENTRICLES: No acute hemorrhage. No evidence of acute infarct. No hydrocephalus. No extra-axial collection. No mass effect or midline shift. There is diffuse cerebral atrophy and chronic small vessel disease throughout the deep white matter. ORBITS: No acute abnormality. SINUSES: No acute abnormality. SOFT TISSUES AND SKULL: No acute soft tissue abnormality. No  skull fracture. IMPRESSION: 1. No acute intracranial abnormality. 2. Diffuse cerebral atrophy and chronic small vessel disease throughout the deep white matter. Electronically signed by: Franky Crease MD 07/26/2024 09:33 PM EDT RP Workstation: [id number]   DG Chest Portable 1 View Result Date: 07/26/2024 EXAM: 1 VIEW(S) XRAY OF THE CHEST 07/26/2024 08:43:00 PM COMPARISON: Portable chest 04/08/2024. CLINICAL HISTORY: Chest pain and lethargy with hypoxemia. Symptom onset following dialysis this morning. FINDINGS: LINES, TUBES AND DEVICES: An electronic device is superimposed over the chest centrally, and there are overlying telemetry leads. LUNGS AND PLEURA: There is dense airspace disease in the left mid to lower lung fields predominating in the periphery, most likely a consolidated pneumonia likely in the lingula as there is obscuration of the lower left heart border. There is a small pleural effusion, most likely a parapneumonic effusion. Rest of the lungs are clear of infiltrates with chronic interstitial changes. No pneumothorax. HEART AND MEDIASTINUM: The aorta is tortuous and calcified, stable mediastinum. There is mild cardiomegaly but no evidence for CHF. BONES AND SOFT TISSUES: Osteopenia. There are new mildly displaced posterolateral fractures of the right 6th and 7th ribs, age indeterminate but probably subacute with suggestion of partial healing. DISCS/DEGENERATIVE CHANGES: Moderate thoracic spondylosis. IMPRESSION: 1. Dense left mid to lower lung airspace disease most consistent with lingular pneumonia, with a small likely parapneumonic effusion. Follow-up study recommended to ensure clearing. 2. New mildly displaced posterolateral fractures of the  right 6th and 7th ribs, age indeterminate but possibly subacute. Electronically signed by: Francis Quam MD 07/26/2024 08:53 PM EDT RP Workstation: [id number]   ECHO as above  TELEMETRY (personally reviewed): sinus rhythm rate 70s  EKG (personally  reviewed): NSR PACs rate 89 bpm  DATA reviewed by me 07/28/24: last 24h vitals tele labs imaging I/O, hospitalist progress note  Principal Problem:   Sepsis due to pneumonia [facility]) Active Problems:   NSTEMI (non-ST elevated myocardial infarction) (HCC)   Essential hypertension   Hypothyroidism   Dyslipidemia   Depression    ASSESSMENT AND PLAN: Cassandra [name] is a 89 y.o. female with a past medical history of coronary artery disease s/p PCI in 1999, aortic atherosclerosis, type 2 diabetes, ESRD on HD, hypertension, and hyperlipidemia who presented to the ED on 07/26/2024 for fatigue, weakness. Cardiology was consulted for further evaluation.   # Sepsis 2/2 pneumonia # Demand ischemia # Coronary artery disease Patient presented with weakness, fatigue.  Found to have sepsis suspected secondary to pneumonia.  Started on IV antibiotics.  Troponins mildly elevated and flat trending at 200 > 322.  Started on IV heparin .  No complaints of chest pain, or other anginal symptoms.  EKG nonischemic.  Echo this admission with preserved EF, no wall motion abnormalities. - Will continue IV heparin  for 48 hours, to end at 3 AM tomorrow. - Continue home aspirin  81 mg daily, Plavix 75 mg daily, Crestor  10 mg daily. - No plan for further cardiac diagnostics at this time. - Further management of pneumonia, sepsis as per primary team.  This patient's case was discussed and created with Dr. Florencio and he is in agreement.  Signed:  Danita Bloch, PA-C  07/28/2024, 8:30 AM [facility] Cardiology

## 2024-07-28 NOTE — Plan of Care (Signed)
 This patient remains on AR-2A as of time of writing. The patient is requiring HFNC O2 at 11 L/min as of time of writing. Plan for HD tomorrow.   Problem: Fluid Volume: Goal: Hemodynamic stability will improve Outcome: Progressing   Problem: Clinical Measurements: Goal: Diagnostic test results will improve Outcome: Progressing Goal: Signs and symptoms of infection will decrease Outcome: Progressing   Problem: Respiratory: Goal: Ability to maintain adequate ventilation will improve Outcome: Progressing   Problem: Education: Goal: Knowledge of General Education information will improve Description: Including pain rating scale, medication(s)/side effects and non-pharmacologic comfort measures Outcome: Progressing   Problem: Health Behavior/Discharge Planning: Goal: Ability to manage health-related needs will improve Outcome: Progressing   Problem: Clinical Measurements: Goal: Ability to maintain clinical measurements within normal limits will improve Outcome: Progressing Goal: Will remain free from infection Outcome: Progressing Goal: Diagnostic test results will improve Outcome: Progressing Goal: Respiratory complications will improve Outcome: Progressing Goal: Cardiovascular complication will be avoided Outcome: Progressing   Problem: Activity: Goal: Risk for activity intolerance will decrease Outcome: Progressing   Problem: Nutrition: Goal: Adequate nutrition will be maintained Outcome: Progressing   Problem: Coping: Goal: Level of anxiety will decrease Outcome: Progressing   Problem: Elimination: Goal: Will not experience complications related to bowel motility Outcome: Progressing Goal: Will not experience complications related to urinary retention Outcome: Progressing   Problem: Pain Managment: Goal: General experience of comfort will improve and/or be controlled Outcome: Progressing   Problem: Safety: Goal: Ability to remain free from injury will  improve Outcome: Progressing   Problem: Skin Integrity: Goal: Risk for impaired skin integrity will decrease Outcome: Progressing   Problem: Education: Goal: Knowledge of disease and its progression will improve Outcome: Progressing Goal: Individualized Educational Video(s) Outcome: Progressing   Problem: Fluid Volume: Goal: Compliance with measures to maintain balanced fluid volume will improve Outcome: Progressing   Problem: Health Behavior/Discharge Planning: Goal: Ability to manage health-related needs will improve Outcome: Progressing   Problem: Nutritional: Goal: Ability to make healthy dietary choices will improve Outcome: Progressing   Problem: Clinical Measurements: Goal: Complications related to the disease process, condition or treatment will be avoided or minimized Outcome: Progressing   Problem: Education: Goal: Knowledge of disease or condition will improve Outcome: Progressing Goal: Knowledge of the prescribed therapeutic regimen will improve Outcome: Progressing Goal: Individualized Educational Video(s) Outcome: Progressing   Problem: Activity: Goal: Ability to tolerate increased activity will improve Outcome: Progressing Goal: Will verbalize the importance of balancing activity with adequate rest periods Outcome: Progressing   Problem: Respiratory: Goal: Ability to maintain a clear airway will improve Outcome: Progressing Goal: Levels of oxygenation will improve Outcome: Progressing Goal: Ability to maintain adequate ventilation will improve Outcome: Progressing   Problem: Education: Goal: Ability to describe self-care measures that may prevent or decrease complications (Diabetes Survival Skills Education) will improve Outcome: Progressing Goal: Individualized Educational Video(s) Outcome: Progressing   Problem: Coping: Goal: Ability to adjust to condition or change in health will improve Outcome: Progressing   Problem: Fluid  Volume: Goal: Ability to maintain a balanced intake and output will improve Outcome: Progressing   Problem: Health Behavior/Discharge Planning: Goal: Ability to identify and utilize available resources and services will improve Outcome: Progressing Goal: Ability to manage health-related needs will improve Outcome: Progressing   Problem: Metabolic: Goal: Ability to maintain appropriate glucose levels will improve Outcome: Progressing   Problem: Nutritional: Goal: Maintenance of adequate nutrition will improve Outcome: Progressing Goal: Progress toward achieving an optimal weight will improve Outcome: Progressing

## 2024-07-28 NOTE — Progress Notes (Signed)
 [facility]  - [place] [facility]   PATIENT NAME: Cassandra [name]    MR#:  [id number]  DATE OF BIRTH:  [birth year]  SUBJECTIVE:  no family at bedside. Patient currently on high flow nasal cannula oxygen came in with increasing shortness of breath. Had some altered mental status. She is on dialysis completed her dialysis yesterday. Found to have multifocal pneumonia. Patient overall feels better breathing is stable vitals are okay.    VITALS:  Blood pressure (!) 171/69, pulse 77, temperature 97.9 F (36.6 C), temperature source Oral, resp. rate 18, height 5' 1 (1.549 m), weight 59 kg, SpO2 94%.  PHYSICAL EXAMINATION:   GENERAL:  89 y.o.-year-old patient with no acute distress. Morbidly obese LUNGS: decreased breath sounds bilaterally, no wheezing CARDIOVASCULAR: S1, S2 normal. No murmur   ABDOMEN: Soft, nontender, nondistended. Bowel sounds present.  EXTREMITIES: No  edema b/l.   HD Access NEUROLOGIC: nonfocal  patient is alert and awake SKIN: per RN  LABORATORY PANEL:  CBC Recent Labs  Lab 07/28/24 0052  WBC 29.9*  HGB 13.7  HCT 42.4  PLT 982*    Chemistries  Recent Labs  Lab 07/26/24 2015 07/27/24 0330  NA 135 136  K 4.8 4.6  CL 91* 97*  CO2 26 22  GLUCOSE 71 65*  BUN 20 28*  CREATININE 5.46* 5.60*  CALCIUM  8.1* 7.3*  AST 40  --   ALT 18  --   ALKPHOS 61  --   BILITOT 1.1  --    Cardiac Enzymes No results for input(s): TROPONINI in the last 168 hours. RADIOLOGY:  ECHOCARDIOGRAM COMPLETE Result Date: 07/27/2024    ECHOCARDIOGRAM REPORT   Patient Name:   Cassandra [name] Date of Exam: 07/27/2024 Medical Rec #:  [id number]         Height:       61.0 in Accession #:    [id number]        Weight:       130.1 lb Date of Birth:  [birth year]        BSA:          1.573 m Patient Age:    89 years          BP:           117/55 mmHg Patient Gender: F                 HR:           75 bpm. Exam Location:  [facility] Procedure: 2D Echo, Color Doppler and  Cardiac Doppler (Both Spectral and Color            Flow Doppler were utilized during procedure). Indications:     I21.4 NSTEMI  History:         Patient has prior history of Echocardiogram examinations. CAD,                  ESRD; Risk Factors:Hypertension.  Sonographer:     L. Thornton-Maynard Referring Phys:  [id number] [facility] [employer] [facility] Diagnosing Phys: Shelda Bruckner MD  Sonographer Comments: Global longitudinal strain was attempted. IMPRESSIONS  1. Left ventricular ejection fraction, by estimation, is 65 to 70%. The left ventricle has normal function. The left ventricle has no regional wall motion abnormalities. There is mild concentric left ventricular hypertrophy. Left ventricular diastolic parameters are consistent with Grade I diastolic dysfunction (impaired relaxation). The average left ventricular global longitudinal strain is -18.6 %. The global longitudinal strain is normal.  2. Right ventricular systolic function is normal. The right ventricular size is normal. There is moderately elevated pulmonary artery systolic pressure. The estimated right ventricular systolic pressure is 59.9 mmHg.  3. Right atrial size was mildly dilated.  4. The mitral valve is normal in structure. Trivial mitral valve regurgitation. No evidence of mitral stenosis.  5. Tricuspid valve regurgitation is moderate.  6. The aortic valve is tricuspid. There is moderate calcification of the aortic valve. Aortic valve regurgitation is mild. Aortic valve sclerosis/calcification is present, without any evidence of aortic stenosis.  7. There is Moderate (Grade III) plaque involving the aortic arch and descending aorta.  8. The inferior vena cava is normal in size with greater than 50% respiratory variability, suggesting right atrial pressure of 3 mmHg. Comparison(s): Changes from prior study are noted. TR now moderate, elevated RVSP. FINDINGS  Left Ventricle: Left ventricular ejection fraction, by estimation, is 65 to 70%. The left  ventricle has normal function. The left ventricle has no regional wall motion abnormalities. The average left ventricular global longitudinal strain is -18.6 %. Strain was performed and the global longitudinal strain is normal. The left ventricular internal cavity size was normal in size. There is mild concentric left ventricular hypertrophy. Left ventricular diastolic parameters are consistent with Grade I diastolic dysfunction (impaired relaxation). Right Ventricle: The right ventricular size is normal. No increase in right ventricular wall thickness. Right ventricular systolic function is normal. There is moderately elevated pulmonary artery systolic pressure. The tricuspid regurgitant velocity is 3.77 m/s, and with an assumed right atrial pressure of 3 mmHg, the estimated right ventricular systolic pressure is 59.9 mmHg. Left Atrium: Left atrial size was normal in size. Right Atrium: Right atrial size was mildly dilated. Pericardium: There is no evidence of pericardial effusion. Mitral Valve: The mitral valve is normal in structure. Trivial mitral valve regurgitation. No evidence of mitral valve stenosis. Tricuspid Valve: The tricuspid valve is normal in structure. Tricuspid valve regurgitation is moderate . No evidence of tricuspid stenosis. Aortic Valve: Focal moderate calcification of noncoronary cusp. The aortic valve is tricuspid. There is moderate calcification of the aortic valve. Aortic valve regurgitation is mild. Aortic regurgitation PHT measures 474 msec. Aortic valve sclerosis/calcification is present, without any evidence of aortic stenosis. Aortic valve mean gradient measures 9.0 mmHg. Aortic valve peak gradient measures 16.6 mmHg. Aortic valve area, by VTI measures 1.89 cm. Pulmonic Valve: The pulmonic valve was not well visualized. Pulmonic valve regurgitation is trivial. No evidence of pulmonic stenosis. Aorta: The aortic root and ascending aorta are structurally normal, with no evidence of  dilitation. There is moderate (Grade III) plaque involving the aortic arch and descending aorta. Venous: The inferior vena cava is normal in size with greater than 50% respiratory variability, suggesting right atrial pressure of 3 mmHg. IAS/Shunts: The atrial septum is grossly normal.  LEFT VENTRICLE PLAX 2D LVIDd:         4.20 cm     Diastology LVIDs:         2.70 cm     LV e' medial:    5.33 cm/s LV PW:         1.40 cm     LV E/e' medial:  18.0 LV IVS:        1.40 cm     LV e' lateral:   6.20 cm/s LVOT diam:     2.00 cm     LV E/e' lateral: 15.5 LV SV:         76 LV  SV Index:   49          2D Longitudinal Strain LVOT Area:     3.14 cm    2D Strain GLS (A4C):   -17.8 % LV IVRT:       77 msec     2D Strain GLS (A3C):   -21.6 %                            2D Strain GLS (A2C):   -16.5 %                            2D Strain GLS Avg:     -18.6 % LV Volumes (MOD) LV vol d, MOD A2C: 68.6 ml LV vol d, MOD A4C: 35.6 ml LV vol s, MOD A2C: 12.7 ml LV vol s, MOD A4C: 9.8 ml LV SV MOD A2C:     55.9 ml LV SV MOD A4C:     35.6 ml LV SV MOD BP:      38.9 ml RIGHT VENTRICLE             IVC RV Basal diam:  3.93 cm     IVC diam: 1.40 cm RV Mid diam:    3.00 cm RV S prime:     12.90 cm/s  PULMONARY VEINS TAPSE (M-mode): 2.6 cm      Diastolic Velocity: 51.60 cm/s                             S/D Velocity:       1.40                             Systolic Velocity:  73.90 cm/s LEFT ATRIUM             Index        RIGHT ATRIUM           Index LA diam:        2.80 cm 1.78 cm/m   RA Area:     18.00 cm LA Vol (A2C):   57.1 ml 36.29 ml/m  RA Volume:   49.60 ml  31.53 ml/m LA Vol (A4C):   32.4 ml 20.59 ml/m LA Biplane Vol: 43.4 ml 27.59 ml/m  AORTIC VALVE                     PULMONIC VALVE AV Area (Vmax):    1.63 cm      PV Vmax:          1.00 m/s AV Area (Vmean):   1.78 cm      PV Peak grad:     4.0 mmHg AV Area (VTI):     1.89 cm      PR End Diast Vel: 4.30 msec AV Vmax:           204.00 cm/s AV Vmean:          140.500 cm/s AV VTI:             0.403 m AV Peak Grad:      16.6 mmHg AV Mean Grad:      9.0 mmHg LVOT Vmax:         106.00 cm/s LVOT Vmean:        79.700 cm/s LVOT VTI:  0.243 m LVOT/AV VTI ratio: 0.60 AI PHT:            474 msec  AORTA Ao Root diam: 3.00 cm Ao Asc diam:  3.30 cm MITRAL VALVE                TRICUSPID VALVE MV Area (PHT): 3.31 cm     TR Peak grad:   56.9 mmHg MV Decel Time: 229 msec     TR Vmax:        377.00 cm/s MV E velocity: 95.90 cm/s MV A velocity: 106.00 cm/s  SHUNTS MV E/A ratio:  0.90         Systemic VTI:  0.24 m                             Systemic Diam: 2.00 cm Shelda Bruckner MD Electronically signed by Shelda Bruckner MD Signature Date/Time: 07/27/2024/2:52:08 PM    Final    CT CHEST ABDOMEN PELVIS WO CONTRAST Result Date: 07/26/2024 EXAM: CT CHEST, ABDOMEN AND PELVIS WITHOUT CONTRAST 03/05/2023 09:23:51 PM TECHNIQUE: CT of the chest, abdomen and pelvis was performed without the administration of intravenous contrast. Multiplanar reformatted images are provided for review. Automated exposure control, iterative reconstruction, and/or weight based adjustment of the mA/kV was utilized to reduce the radiation dose to as low as reasonably achievable. COMPARISON: None available. CLINICAL HISTORY: Sepsis. FINDINGS: CHEST: MEDIASTINUM AND LYMPH NODES: Mild cardiomegaly. Diffuse coronary artery and aortic atherosclerosis. The central airways are clear. No mediastinal, hilar or axillary lymphadenopathy. LUNGS AND PLEURA: Consolidation in the left lower lobe and lingula and to a lesser extent right lower lobe compatible with pneumonia. No pleural effusion or pneumothorax. ABDOMEN AND PELVIS: LIVER: The liver is unremarkable. GALLBLADDER AND BILE DUCTS: Small layering gallstones within the gallbladder. No evidence of acute cholecystitis. No biliary ductal dilatation. SPLEEN: No acute abnormality. PANCREAS: No acute abnormality. ADRENAL GLANDS: No acute abnormality. KIDNEYS, URETERS AND BLADDER:  Atrophic kidneys bilaterally. Left renal mid pole cyst measures 3.5 cm. Per consensus, no follow-up is needed for simple Bosniak type 1 and 2 renal cysts, unless the patient has a malignancy history or risk factors. The urinary bladder is decompressed. No stones in the kidneys or ureters. No hydronephrosis. No perinephric or periureteral stranding. GI AND BOWEL: Stomach demonstrates no acute abnormality. Postoperative changes in the rectosigmoid colon. Scattered colonic diverticulosis, most pronounced in the left colon. There is no bowel obstruction. REPRODUCTIVE ORGANS: Prior hysterectomy. PERITONEUM AND RETROPERITONEUM: No ascites. No free air. VASCULATURE: Aorta is normal in caliber. ABDOMINAL AND PELVIS LYMPH NODES: No lymphadenopathy. BONES AND SOFT TISSUES: Diffuse degenerative disc and facet disease throughout the thoracolumbar spine. No focal soft tissue abnormality. IMPRESSION: 1. Multifocal consolidation greatest in the left lower lobe and lingula, compatible with pneumonia. 2. Cholelithiasis without acute cholecystitis. 3. Bilateral renal atrophy. 4. Colonic diverticulosis. 5. Aortic atherosclerosis. Coronary artery disease. Electronically signed by: Franky Crease MD 07/26/2024 09:37 PM EDT RP Workstation: [id number]   CT HEAD WO CONTRAST ( ) Result Date: 07/26/2024 EXAM: CT HEAD WITHOUT CONTRAST 07/26/2024 09:23:51 PM TECHNIQUE: CT of the head was performed without the administration of intravenous contrast. Automated exposure control, iterative reconstruction, and/or weight based adjustment of the mA/kV was utilized to reduce the radiation dose to as low as reasonably achievable. COMPARISON: 05/02/2022 CLINICAL HISTORY: Headache, new onset (Age >= 51y) FINDINGS: BRAIN AND VENTRICLES: No acute hemorrhage. No evidence of acute infarct. No hydrocephalus. No extra-axial collection. No mass  effect or midline shift. There is diffuse cerebral atrophy and chronic small vessel disease throughout the deep  white matter. ORBITS: No acute abnormality. SINUSES: No acute abnormality. SOFT TISSUES AND SKULL: No acute soft tissue abnormality. No skull fracture. IMPRESSION: 1. No acute intracranial abnormality. 2. Diffuse cerebral atrophy and chronic small vessel disease throughout the deep white matter. Electronically signed by: Franky Crease MD 07/26/2024 09:33 PM EDT RP Workstation: [id number]   DG Chest Portable 1 View Result Date: 07/26/2024 EXAM: 1 VIEW(S) XRAY OF THE CHEST 07/26/2024 08:43:00 PM COMPARISON: Portable chest 04/08/2024. CLINICAL HISTORY: Chest pain and lethargy with hypoxemia. Symptom onset following dialysis this morning. FINDINGS: LINES, TUBES AND DEVICES: An electronic device is superimposed over the chest centrally, and there are overlying telemetry leads. LUNGS AND PLEURA: There is dense airspace disease in the left mid to lower lung fields predominating in the periphery, most likely a consolidated pneumonia likely in the lingula as there is obscuration of the lower left heart border. There is a small pleural effusion, most likely a parapneumonic effusion. Rest of the lungs are clear of infiltrates with chronic interstitial changes. No pneumothorax. HEART AND MEDIASTINUM: The aorta is tortuous and calcified, stable mediastinum. There is mild cardiomegaly but no evidence for CHF. BONES AND SOFT TISSUES: Osteopenia. There are new mildly displaced posterolateral fractures of the right 6th and 7th ribs, age indeterminate but probably subacute with suggestion of partial healing. DISCS/DEGENERATIVE CHANGES: Moderate thoracic spondylosis. IMPRESSION: 1. Dense left mid to lower lung airspace disease most consistent with lingular pneumonia, with a small likely parapneumonic effusion. Follow-up study recommended to ensure clearing. 2. New mildly displaced posterolateral fractures of the right 6th and 7th ribs, age indeterminate but possibly subacute. Electronically signed by: Francis Quam MD 07/26/2024  08:53 PM EDT RP Workstation: [id number]    Assessment and Plan  CAYLYN TEDESCHI is a 89 y.o. female with medical history significant for coronary artery disease, end-stage renal disease on hemodialysis on TTS, hypertension, dyslipidemia, hypothyroidism, lumbar stenosis, RLS and type 2 diabetes mellitus, who presented to the emergency room with acute onset of fatigue and generalized weakness with altered mental status and lethargy.  Blood pressure was 79/38 with EMS.  Patient admitted to cough productive of whitish sputum   Portable chest x-ray showed the following: 1. Dense left mid to lower lung airspace disease most consistent with lingular pneumonia, with a small likely parapneumonic effusion. Follow-up study recommended to ensure clearing. 2. New mildly displaced posterolateral fractures of the right 6th and 7th ribs, age indeterminate but possibly subacute.  CT chest/abdomen: 1. Multifocal consolidation greatest in the left lower lobe and lingula, compatible with pneumonia. 2. Cholelithiasis without acute cholecystitis. 3. Bilateral renal atrophy. 4. Colonic diverticulosis. 5. Aortic atherosclerosis. Coronary artery disease.   Severe sepsis due to multifocal pneumonia (HCC) -pt currently on IV Rocephin  and Zithromax . -- Patient came in with altered mental status, respiratory distress, elevated white count, elevated lactic acid and multifocal pneumonia. - Mucolytic therapy be provided as well as duo nebs q.i.d. and q.4 hours p.r.n. - blood cultures negative so far- -continue hydration with IV lactated ringer . -- Pulmonary consultation--Dr Assaker--change to zosyn,vanc and azithromycin  --IV hydrocortisone --stress does --MRSA PCR neg --Chest PT   Elevated troponin w/o chest pain--likley demand ischemia with sespsi --Iv heparin  for 48 hours per dr Fernand  - She is not having any current chest pain though she is diabetic. - continue aspirin  and Plavix. -  on high-dose statin  therapy   Depression -  Continue Zoloft.   Dyslipidemia -continue statin therapy.   Hypothyroidism -  continue Synthroid .   Essential hypertension - continue antihypertensive therapy.    Procedures: Family communication :none Consults :Pulmonary CODE STATUS: DNR DVT Prophylaxis :heparin  gtt Level of care: Progressive Status is: Inpatient Remains inpatient appropriate because: severe sepsis due to multifoca pneumonia    TOTAL TIME TAKING CARE OF THIS PATIENT: 45 minutes.  >50% time spent on counselling and coordination of care  Note: This dictation was prepared with Dragon dictation along with smaller phrase technology. Any transcriptional errors that result from this process are unintentional.  Leita Blanch M.D    [facility]   CC: Primary care physician; Cleotilde Oneil FALCON, MD

## 2024-07-28 NOTE — TOC Initial Note (Signed)
 Transition of Care [facility]) - Initial/Assessment Note    Patient Details  Name: Cassandra [name] MRN: [id number] Date of Birth: [birth year]  [facility] CM/SW Contact:    Shasta DELENA Daring, RN Phone Number: 07/28/2024, 3:03 PM  Clinical Narrative:                 Readmission Prevention Screen complete. Confirmed PCP is Oneil Pinal. Patient spouse provides transportation to appointments. [facility]. No problems affording medication at this time. Patient lives in apartment with husband. Has home health services with [facility], services include nursing and PT. Has rolling walker at home. Husband will provide transportation home at discharge.    Barriers to Discharge: Continued Medical Work up   Patient Goals and CMS Choice            Expected Discharge Plan and Services     Post Acute Care Choice: Resumption of Svcs/PTA Provider Living arrangements for the past 2 months: Apartment                           HH Arranged: RN, PT HH Agency: [facility] Date [facility] Agency Contacted: 07/28/24   Representative spoke with at [facility] Agency: [place]  Prior Living Arrangements/Services Living arrangements for the past 2 months: Apartment Lives with:: Spouse Patient language and need for interpreter reviewed:: Yes Do you feel safe going back to the place where you live?: Yes      Need for Family Participation in Patient Care: Yes (Comment) Care giver support system in place?: Yes (comment) Current home services: Home PT, Home RN Criminal Activity/Legal Involvement Pertinent to Current Situation/Hospitalization: No - Comment as needed  Activities of Daily Living   ADL Screening (condition at time of admission) Independently performs ADLs?: No Does the patient have a NEW difficulty with bathing/dressing/toileting/self-feeding that is expected to last >3 days?: Yes (Initiates electronic notice to provider for possible OT consult) Does the patient  have a NEW difficulty with getting in/out of bed, walking, or climbing stairs that is expected to last >3 days?: Yes (Initiates electronic notice to provider for possible PT consult) Does the patient have a NEW difficulty with communication that is expected to last >3 days?: No Is the patient deaf or have difficulty hearing?: No Does the patient have difficulty seeing, even when wearing glasses/contacts?: No Does the patient have difficulty concentrating, remembering, or making decisions?: No  Permission Sought/Granted Permission sought to share information with : Facility Industrial/product Designer granted to share information with : Yes, Verbal Permission Granted     Permission granted to share info w AGENCY: [facility]        Emotional Assessment Appearance:: Developmentally appropriate, Appears stated age Attitude/Demeanor/Rapport: Gracious, Engaged Affect (typically observed): Calm, Accepting, Adaptable Orientation: : Oriented to Situation, Oriented to  Time, Oriented to Place, Oriented to Self Alcohol / Substance Use: Not Applicable Psych Involvement: No (comment)  Admission diagnosis:  ESRD (end stage renal disease) on dialysis (HCC) [N18.6, Z99.2] Sepsis due to pneumonia (HCC) [J18.9, A41.9] Pneumonia due to infectious organism, unspecified laterality, unspecified part of lung [J18.9] Sepsis, due to unspecified organism, unspecified whether acute organ dysfunction present [facility]) [A41.9] Patient Active Problem List   Diagnosis Date Noted   NSTEMI (non-ST elevated myocardial infarction) (HCC) 07/27/2024   Essential hypertension 07/27/2024   Hypothyroidism 07/27/2024   Dyslipidemia 07/27/2024   Depression 07/27/2024   Sepsis due to pneumonia (HCC) 07/26/2024  Stroke (HCC) 07/16/2024   ESRD on dialysis (HCC) 07/16/2024   Mechanical complication of AV shunt 06/01/2024   Acute hypoxemic respiratory failure (HCC) 04/08/2024   Complication of AV dialysis  fistula, initial encounter 08/06/2023   Renal failure 03/06/2023   Acute hypoxic respiratory failure (HCC) 03/05/2023   Pneumonia 03/05/2023   ESRD (end stage renal disease) on dialysis (HCC) 03/05/2023   Hyperkalemia 03/05/2023   Overweight (BMI 25.0-29.9) 12/14/2021   Acute renal failure superimposed on stage 4 chronic kidney disease (HCC) 12/14/2021   Hematochezia, recurrent 12/11/2021   Recent cerebrovascular accident 10/12/2021 (CVA) 12/11/2021   Hypotension 12/11/2021   GI bleed 12/11/2021   Acute blood loss anemia 12/11/2021   Cholelithiasis 12/11/2021   Acute CVA (cerebrovascular accident) (HCC) 10/13/2021   Leukocytosis 10/13/2021   Rectal bleeding 10/03/2021   Bright red rectal bleeding 10/02/2021   GIB (gastrointestinal bleeding) 10/02/2021   Hypoxemia 05/15/2021   Hypoxia 05/14/2021   Hypertensive urgency 05/14/2021   Tibial plateau fracture 04/21/21, left, closed, with routine healing, subsequent encounter 05/14/2021   CKD (chronic kidney disease) stage 4, GFR 15-29 ml/min (HCC) 04/07/2021   Acquired hypothyroidism 12/02/2020   Hyperlipidemia, mixed 12/02/2020   Aortic atherosclerosis 10/04/2020   Polycythemia vera (HCC) 06/02/2020   Thrombocytosis 02/18/2020   Vitamin D  deficiency 03/25/2018   Hypertension 01/24/2015   Diabetes mellitus, type II (HCC) 01/24/2015   Restless legs syndrome 01/24/2015   Chronic constipation 01/24/2015   Lumbar stenosis with neurogenic claudication 05/04/2014   Peripheral neuropathy 03/09/2014   CAD (coronary artery disease), native coronary artery 01/07/2014   PCP:  Cleotilde Oneil FALCON, MD [facility]acy[facility]   [phone], [place] - [address] [place] [address] Phone: [phone] Fax: [phone]  [facility] - [place], [place] - [address] [place] [address] Phone: [phone] Fax: [phone]     Social Drivers of Health (SDOH) Social  History: SDOH Screenings   Food Insecurity: No Food Insecurity (07/27/2024)  Housing: Low Risk  (07/27/2024)  Transportation Needs: No Transportation Needs (07/27/2024)  Utilities: Not At Risk (07/27/2024)  Financial Resource Strain: Low Risk  (06/25/2024)   Received from [facility]  Social Connections: Moderately Integrated (07/27/2024)  Tobacco Use: Medium Risk (07/16/2024)   SDOH Interventions:     Readmission Risk Interventions    07/28/2024    2:52 PM 03/09/2023    4:09 PM  Readmission Risk Prevention Plan  Transportation Screening Complete Complete  PCP or Specialist Appt within 3-5 Days  Complete  HRI or Home Care Consult  Complete  Social Work Consult for Recovery Care Planning/Counseling  Complete  Palliative Care Screening  Not Applicable  Medication Review Oceanographer) Complete Complete  PCP or Specialist appointment within 3-5 days of discharge Complete   HRI or Home Care Consult Complete   SW Recovery Care/Counseling Consult Complete   Palliative Care Screening Not Applicable   Skilled Nursing Facility Not Applicable

## 2024-07-28 NOTE — Progress Notes (Signed)
 Pt receives outpt HD at [facility]. Navigator following to assist with any HD needs.  Suzen Satchel Dialysis Navigator [phone]

## 2024-07-29 DIAGNOSIS — J189 Pneumonia, unspecified organism: Secondary | ICD-10-CM | POA: Diagnosis not present

## 2024-07-29 DIAGNOSIS — A419 Sepsis, unspecified organism: Secondary | ICD-10-CM | POA: Diagnosis not present

## 2024-07-29 LAB — CBC
HCT: 42.6 % (ref 36.0–46.0)
Hemoglobin: 13.8 g/dL (ref 12.0–15.0)
MCH: 31.7 pg (ref 26.0–34.0)
MCHC: 32.4 g/dL (ref 30.0–36.0)
MCV: 97.9 fL (ref 80.0–100.0)
Platelets: 973 K/uL (ref 150–400)
RBC: 4.35 MIL/uL (ref 3.87–5.11)
RDW: 16.1 % — ABNORMAL HIGH (ref 11.5–15.5)
WBC: 30.9 K/uL — ABNORMAL HIGH (ref 4.0–10.5)
nRBC: 0 % (ref 0.0–0.2)

## 2024-07-29 LAB — BASIC METABOLIC PANEL WITH GFR
Anion gap: 16 — ABNORMAL HIGH (ref 5–15)
BUN: 67 mg/dL — ABNORMAL HIGH (ref 8–23)
CO2: 20 mmol/L — ABNORMAL LOW (ref 22–32)
Calcium: 7.9 mg/dL — ABNORMAL LOW (ref 8.9–10.3)
Chloride: 98 mmol/L (ref 98–111)
Creatinine, Ser: 7.43 mg/dL — ABNORMAL HIGH (ref 0.44–1.00)
GFR, Estimated: 5 mL/min — ABNORMAL LOW (ref >60)
Glucose, Bld: 115 mg/dL — ABNORMAL HIGH (ref 70–99)
Potassium: 5.6 mmol/L — ABNORMAL HIGH (ref 3.5–5.1)
Sodium: 134 mmol/L — ABNORMAL LOW (ref 135–145)

## 2024-07-29 LAB — C-REACTIVE PROTEIN: CRP: 25.3 mg/dL — ABNORMAL HIGH (ref <1.0)

## 2024-07-29 MED ORDER — HEPARIN SODIUM (PORCINE) 5000 UNIT/ML IJ SOLN
5000.0000 [IU] | Freq: Three times a day (TID) | INTRAMUSCULAR | Status: DC
  Administered 2024-07-29 – 2024-08-06 (×24): 5000 [IU] via SUBCUTANEOUS
  Filled 2024-07-29 (×24): qty 1

## 2024-07-29 MED ORDER — PIPERACILLIN-TAZOBACTAM IN DEX 2-0.25 GM/50ML IV SOLN
2.2500 g | Freq: Three times a day (TID) | INTRAVENOUS | Status: DC
Start: 2024-07-29 — End: 2024-08-04
  Administered 2024-07-29 – 2024-08-03 (×14): 2.25 g via INTRAVENOUS
  Filled 2024-07-29 (×18): qty 50

## 2024-07-29 NOTE — Progress Notes (Signed)
 NAME:  Cassandra [name], MRN:  [id number], DOB:  Jan 29, 1927, LOS: 3 ADMISSION DATE:  07/26/2024, CONSULTATION DATE:  07/27/2024 REFERRING MD:  Leita Blanch MD, CHIEF COMPLAINT:  CAP   History of Present Illness:  Cassandra [name] is a 89 year old female patient with a past medical history of coronary artery disease, end-stage renal disease on HD Tuesday Thursday Saturday, hypertension, dyslipidemia, hypothyroidism, type 2 diabetes mellitus who presented to the emergency department on 07/26/2024 with fatigue and generalized weakness.  She was found to be encephalopathic.  Apparently last known well the morning of her presentation then underwent HD and after HD she was noted to be more lethargic.  EMS were called and and found her to be hypotensive and therefore she was brought in to the emergency department.  She was found to be slightly hypotensive and responded to fluids.  She was also found to be hypoxic and was placed on 10 L nasal cannula.  Labs were notable for an elevated white count of 34.4 with neutrophilic predominance at 85%.  Platelets were elevated at 1000.  Lactic acid of 3.4.  Electrolytes were within normal range.  Creatinine was 5.46 mg/dL but she is a hemodialysis patient.  CT chest on 11/1 with multifocal large consolidation greatest in the left lower lobe and lingula.  She was admitted and started on ceftriaxone  and azithromycin .  Pulmonary team is being consulted for help with further management.     IMAGING   Narrative & Impression  EXAM: CT CHEST, ABDOMEN AND PELVIS WITHOUT CONTRAST 03/05/2023 09:23:51 PM   TECHNIQUE: CT of the chest, abdomen and pelvis was performed without the administration of intravenous contrast. Multiplanar reformatted images are provided for review. Automated exposure control, iterative reconstruction, and/or weight based adjustment of the mA/kV was utilized to reduce the radiation dose to as low as reasonably achievable.   COMPARISON: None  available.   CLINICAL HISTORY: Sepsis.   FINDINGS:   CHEST:   MEDIASTINUM AND LYMPH NODES: Mild cardiomegaly. Diffuse coronary artery and aortic atherosclerosis. The central airways are clear. No mediastinal, hilar or axillary lymphadenopathy.   LUNGS AND PLEURA: Consolidation in the left lower lobe and lingula and to a lesser extent right lower lobe compatible with pneumonia. No pleural effusion or pneumothorax.   ABDOMEN AND PELVIS:   LIVER: The liver is unremarkable.   GALLBLADDER AND BILE DUCTS: Small layering gallstones within the gallbladder. No evidence of acute cholecystitis. No biliary ductal dilatation.   SPLEEN: No acute abnormality.   PANCREAS: No acute abnormality.   ADRENAL GLANDS: No acute abnormality.   KIDNEYS, URETERS AND BLADDER: Atrophic kidneys bilaterally. Left renal mid pole cyst measures 3.5 cm. Per consensus, no follow-up is needed for simple Bosniak type 1 and 2 renal cysts, unless the patient has a malignancy history or risk factors. The urinary bladder is decompressed. No stones in the kidneys or ureters. No hydronephrosis. No perinephric or periureteral stranding.   GI AND BOWEL: Stomach demonstrates no acute abnormality. Postoperative changes in the rectosigmoid colon. Scattered colonic diverticulosis, most pronounced in the left colon. There is no bowel obstruction.   REPRODUCTIVE ORGANS: Prior hysterectomy.   PERITONEUM AND RETROPERITONEUM: No ascites. No free air.   VASCULATURE: Aorta is normal in caliber.   ABDOMINAL AND PELVIS LYMPH NODES: No lymphadenopathy.   BONES AND SOFT TISSUES: Diffuse degenerative disc and facet disease throughout the thoracolumbar spine. No focal soft tissue abnormality.   IMPRESSION: 1. Multifocal consolidation greatest in the left lower lobe and lingula, compatible with  pneumonia. 2. Cholelithiasis without acute cholecystitis. 3. Bilateral renal atrophy. 4. Colonic diverticulosis. 5.  Aortic atherosclerosis. Coronary artery disease.   Electronically signed by: Franky Crease MD 07/26/2024 09:37 PM EDT RP Workstation: [id number]   Objective    Blood pressure (!) 161/79, pulse 94, temperature 97.7 F (36.5 C), temperature source Axillary, resp. rate (!) 23, height 5' 1 (1.549 m), weight 57.4 kg, SpO2 98%.        Intake/Output Summary (Last 24 hours) at 07/29/2024 1352 Last data filed at 07/29/2024 1140 Gross per 24 hour  Intake 457.45 ml  Output 1000 ml  Net -542.55 ml   Filed Weights   07/29/24 0501 07/29/24 0806 07/29/24 1140  Weight: 58.4 kg 58.4 kg 57.4 kg    Examination: General: NAD.  HENT: Supple neck, reactive pupils Lungs: Diminished air entry over the left hemithorax.  Cardiovascular: Normal S1, Normal S2, RRR Abdomen: Soft, non tender, non distended  Extremities: Warm, no edema.   Labs and imaging were reviewed.  Assessment and Plan  Cassandra [name] is a 89 year old female patient with a past medical history of coronary artery disease, end-stage renal disease on HD Tuesday Thursday Saturday, hypertension, dyslipidemia, hypothyroidism, type 2 diabetes mellitus who presented to the emergency department on 07/26/2024 with fatigue and generalized weakness.  She was found to have a large left lower and lingular consolidation.  # Sepsis -PRESENT ON ADMISSION - DUE TO PNEUMONIA # Multifocal pneumonia with large consolidation in the left lowe lobe and lingula.  # ESRD on HD TTS  Plan [] -DC SOLUCORTEF, DC BENADRYL , DC GABAPENTIN , DC REQUIP  CONTINUE ZITHROMAX  AND ZOSYN [] CRP TREND TO CONFIRM NON FLUID RELATED CHANGES []  Titrate for SpO2 > 90%.  -START CHEST PT WITH INCENTIVE SPIROMETRY FLUTTER VALVE AND PT/OT WHEN ABLE I personally spent a total of 61 minutes in the care of the patient today including preparing to see the patient, getting/reviewing separately obtained history, performing a medically appropriate exam/evaluation, counseling and educating, placing  orders, documenting clinical information in the EHR, independently interpreting results, and communicating results.   Halina Picking, MD 07/29/2024 1:52 PM

## 2024-07-29 NOTE — Evaluation (Signed)
 Physical Therapy Evaluation Patient Details Name: Cassandra [name] MRN: [id number] DOB: [birth year] Today's Date: 07/29/2024  History of Present Illness  Cassandra [name] is a 89 y.o. female with medical history significant for coronary artery disease, end-stage renal disease on hemodialysis on TTS, hypertension, dyslipidemia, hypothyroidism, lumbar stenosis, RLS and type 2 diabetes mellitus, who presented to the emergency room with acute onset of fatigue and generalized weakness with altered mental status and lethargy.  Blood pressure was 79/38 with EMS.  Patient admitted to cough productive of whitish sputum.  No chest pain or palpitations..  She admitted to chest pain yesterday though.  She had low-grade fever of 99.5.  No dysuria, urinary frequency or urgency, hematuria or flank pain.  No diarrhea or melena or bright red bleeding per rectum.  No other bleeding diathesis.  Clinical Impression  Co tx with OT to address functional mobility/safety. Limited session d/t pt requiring pericare/hygiene. Pt is pleasant 89 y.o. female admitted for sepsis d/t pneumonia. PLOF info provided by husband who is at bedside. Prior to hospitalization, pt able to amb using a rollator for short community distances. Pt requires assistance for bathing, however is able to dress herself IND.  Pt unable to vocalize A&O questioning. Pt received in supine with LLE crossed under her. Pt found with BM in bed requiring extensive hygiene/pericare for skin integrity. Pt performs bed mobility with max A. Pt able to maintain sidelying position with mod-min A to remain on side.  Total A for peri care. Pt on 4L of O2. SpO2 WNL throughout session. Inc SOB noted with activity and pt unable to dec RR despite cues for PLB. HR up to 101bpm. Nursing team also at bedside to assist with linen change and pericare. Nursing team aware of pt cognition.       If plan is discharge home, recommend the following: Two people to help with walking and/or  transfers;Two people to help with bathing/dressing/bathroom;Direct supervision/assist for medications management;Direct supervision/assist for financial management;Assist for transportation;Supervision due to cognitive status   Can travel by private vehicle   No    Equipment Recommendations Other (comment) (TBD at next venue)  Recommendations for Other Services       Functional Status Assessment Patient has had a recent decline in their functional status and demonstrates the ability to make significant improvements in function in a reasonable and predictable amount of time.     Precautions / Restrictions Precautions Precautions: Fall Recall of Precautions/Restrictions: Impaired Restrictions Weight Bearing Restrictions Per Provider Order: No      Mobility  Bed Mobility Overal bed mobility: Needs Assistance Bed Mobility: Rolling Rolling: Mod assist, Max assist         General bed mobility comments: max a to roll onto side. min-mod A to maintain sidelying position for adequate cleaning.    Transfers                   General transfer comment: NT. limited session d/t needing to perform hygiene.    Ambulation/Gait               General Gait Details: NT. limited session d/t needing to perform hygiene.  Stairs            Wheelchair Mobility     Tilt Bed    Modified Rankin (Stroke Patients Only)       Balance  Pertinent Vitals/Pain      Home Living Family/patient expects to be discharged to:: Private residence Living Arrangements: Spouse/significant other Available Help at Discharge: Family;Available PRN/intermittently Type of Home: Apartment Home Access: Level entry       Home Layout: One level Home Equipment: Rollator (4 wheels);Tub bench;Rolling Walker (2 wheels);BSC/3in1      Prior Function Prior Level of Function : Needs assist             Mobility Comments:  Per pt's husband, pt amb using rollator for short community distances. ADLs Comments: per husband, pt only requires assistance with bathing.     Extremity/Trunk Assessment   Upper Extremity Assessment Upper Extremity Assessment: Generalized weakness    Lower Extremity Assessment Lower Extremity Assessment: Generalized weakness       Communication   Communication Communication: Impaired Factors Affecting Communication: Difficulty expressing self;Reduced clarity of speech    Cognition Arousal: Alert Behavior During Therapy: Flat affect   PT - Cognitive impairments: Difficult to assess Difficult to assess due to: Impaired communication                     PT - Cognition Comments: Pt alert however unable to vocalize/answer A&O questions. Following commands: Impaired Following commands impaired: Follows one step commands inconsistently     Cueing Cueing Techniques: Verbal cues, Visual cues     General Comments General comments (skin integrity, edema, etc.): Pt on 4L of O2. SpO2 WNL however pt with SOB throughout session and demos breathign through her mouth despite cues for PLB.    Exercises Other Exercises Other Exercises: Assistance with pericare. total A   Assessment/Plan    PT Assessment Patient needs continued PT services  PT Problem List Decreased strength;Decreased activity tolerance;Decreased balance;Decreased mobility       PT Treatment Interventions DME instruction;Gait training;Functional mobility training;Therapeutic activities;Therapeutic exercise;Balance training;Neuromuscular re-education;Patient/family education    PT Goals (Current goals can be found in the Care Plan section)  Acute Rehab PT Goals Patient Stated Goal: none stated PT Goal Formulation: Patient unable to participate in goal setting Time For Goal Achievement: 08/12/24 Potential to Achieve Goals: Fair    Frequency Min 2X/week     Co-evaluation PT/OT/SLP  Co-Evaluation/Treatment: Yes Reason for Co-Treatment: For patient/therapist safety;To address functional/ADL transfers PT goals addressed during session: Mobility/safety with mobility OT goals addressed during session: ADL's and self-care       AM-PAC PT 6 Clicks Mobility  Outcome Measure Help needed turning from your back to your side while in a flat bed without using bedrails?: A Lot Help needed moving from lying on your back to sitting on the side of a flat bed without using bedrails?: Total Help needed moving to and from a bed to a chair (including a wheelchair)?: Total Help needed standing up from a chair using your arms (e.g., wheelchair or bedside chair)?: Total Help needed to walk in hospital room?: Total Help needed climbing 3-5 steps with a railing? : Total 6 Click Score: 7    End of Session Equipment Utilized During Treatment: Oxygen Activity Tolerance: Patient tolerated treatment well Patient left: in bed;with call bell/phone within reach;with nursing/sitter in room Nurse Communication: Mobility status PT Visit Diagnosis: Other abnormalities of gait and mobility (R26.89);Muscle weakness (generalized) (M62.81)    Time: 8559-8490 PT Time Calculation (min) (ACUTE ONLY): 29 min   Charges:                 Nehal Shives, SPT   [place]mmag[place] Runkel 07/29/2024,  3:35 PM

## 2024-07-29 NOTE — Progress Notes (Signed)
  Received patient in bed to unit.   Informed consent signed and in chart.    TX duration: 3.25 hrs     Transported back to floor Hand-off given to patient's nurse. No acute distress noted     Access used: L AVF Access issues: none   Total UF removed: 1.0L Medication(s) given: none Post HD VS: wnl      Olivia Hurst LPN [facility]

## 2024-07-29 NOTE — Progress Notes (Signed)
 Occupational Therapy Treatment Patient Details Name: Cassandra [name] MRN: [id number] DOB: 06-Nov-1926 Today's Date: 07/29/2024   History of present illness Cassandra [name] is a 89 y.o. female with medical history significant for coronary artery disease, end-stage renal disease on hemodialysis on TTS, hypertension, dyslipidemia, hypothyroidism, lumbar stenosis, RLS and type 2 diabetes mellitus, who presented to the emergency room with acute onset of fatigue and generalized weakness with altered mental status and lethargy.  Blood pressure was 79/38 with EMS.  Patient admitted to cough productive of whitish sputum.  No chest pain or palpitations..  She admitted to chest pain yesterday though.  She had low-grade fever of 99.5.  No dysuria, urinary frequency or urgency, hematuria or flank pain.  No diarrhea or melena or bright red bleeding per rectum.  No other bleeding diathesis.   OT comments  Patient seen for PT/OT treatment on this date, patient benefits from 2 person assist for safety due to fatigue s/p HD. Upon arrival to room patient had large bowel incontinence; patient was able to roll either side in bed with max A x 2; verbalized discomfort in sidelying during thorough perineal hygiene; increased time spent on hygiene due to large BM; RN and NT present as well to assist with full linen change; O2 monitored throughout and remained above 90% while on 2L. Patient was not vocalizing today compared to yesterday, RN aware and reports that's how she was this morning prior to HD as well. Patient very fatigued after full bed/clothing change. Patient ended treatment in bed with RN present.  Progress toward goals was limited today, will continue to follow POC. Discharge recommendation remains appropriate.        If plan is discharge home, recommend the following:  A lot of help with walking and/or transfers;A lot of help with bathing/dressing/bathroom;Assistance with feeding;Direct supervision/assist for  medications management   Equipment Recommendations  Other (comment) (defer to next venue of care)    Recommendations for Other Services      Precautions / Restrictions Precautions Precautions: Fall Recall of Precautions/Restrictions: Impaired Restrictions Weight Bearing Restrictions Per Provider Order: No       Mobility Bed Mobility Overal bed mobility: Needs Assistance Bed Mobility: Rolling Rolling: Total assist, +2 for physical assistance              Transfers                         Balance                                           ADL either performed or assessed with clinical judgement   ADL Overall ADL's : Needs assistance/impaired             Lower Body Bathing: Maximal assistance   Upper Body Dressing : Maximal assistance   Lower Body Dressing: Maximal assistance       Toileting- Clothing Manipulation and Hygiene: Total assistance;+2 for physical assistance;Bed level         General ADL Comments: large bowel incontinence, total A x 3 for bed mobility/linen change/thorough hygiene    Extremity/Trunk Assessment Upper Extremity Assessment Upper Extremity Assessment: Generalized weakness   Lower Extremity Assessment Lower Extremity Assessment: Generalized weakness        Vision       Perception     Praxis  Communication Communication Communication: Impaired Factors Affecting Communication: Difficulty expressing self;Reduced clarity of speech   Cognition Arousal: Lethargic Behavior During Therapy: WFL for tasks assessed/performed Cognition: No family/caregiver present to determine baseline, Cognition impaired, Difficult to assess             OT - Cognition Comments: patient not vocalizing with PT/OT                 Following commands: Impaired Following commands impaired: Follows one step commands inconsistently      Cueing   Cueing Techniques: Verbal cues, Tactile cues  Exercises       Shoulder Instructions       General Comments Pt on 4L of O2. SpO2 WNL however pt with SOB throughout session and demos breathign through her mouth despite cues for PLB.    Pertinent Vitals/ Pain       Pain Assessment Pain Assessment: PAINAD Breathing: occasional labored breathing, short period of hyperventilation Negative Vocalization: occasional moan/groan, low speech, negative/disapproving quality Facial Expression: sad, frightened, frown Body Language: tense, distressed pacing, fidgeting Pain Location: generalized Pain Intervention(s): Monitored during session  Home Living Family/patient expects to be discharged to:: Private residence Living Arrangements: Spouse/significant other Available Help at Discharge: Family;Available PRN/intermittently Type of Home: Apartment Home Access: Level entry     Home Layout: One level     Bathroom Shower/Tub: Chief Strategy Officer: Standard Bathroom Accessibility: Yes   Home Equipment: Rollator (4 wheels);Tub bench;Rolling Walker (2 wheels);BSC/3in1          Prior Functioning/Environment              Frequency  Min 2X/week        Progress Toward Goals  OT Goals(current goals can now be found in the care plan section)  Progress towards OT goals: Not progressing toward goals - comment (limited participaton, fatigued from dialysis and large bowel incontinence at start of session)  Acute Rehab OT Goals Patient Stated Goal: none stated OT Goal Formulation: Patient unable to participate in goal setting Time For Goal Achievement: 08/11/24 Potential to Achieve Goals: Fair ADL Goals Pt Will Perform Grooming: with supervision;sitting;standing Pt Will Perform Upper Body Bathing: sitting;with supervision Pt Will Perform Lower Body Dressing: with min assist;sit to/from stand Pt Will Transfer to Toilet: with contact guard assist;ambulating;bedside commode  Plan      Co-evaluation    PT/OT/SLP  Co-Evaluation/Treatment: Yes Reason for Co-Treatment: Complexity of the patient's impairments (multi-system involvement);For patient/therapist safety;To address functional/ADL transfers PT goals addressed during session: Mobility/safety with mobility OT goals addressed during session: ADL's and self-care      AM-PAC OT 6 Clicks Daily Activity     Outcome Measure   Help from another person eating meals?: A Little Help from another person taking care of personal grooming?: A Little Help from another person toileting, which includes using toliet, bedpan, or urinal?: A Lot Help from another person bathing (including washing, rinsing, drying)?: A Lot Help from another person to put on and taking off regular upper body clothing?: A Lot Help from another person to put on and taking off regular lower body clothing?: A Lot 6 Click Score: 14    End of Session Equipment Utilized During Treatment: Oxygen  OT Visit Diagnosis: Unsteadiness on feet (R26.81);Other abnormalities of gait and mobility (R26.89);Muscle weakness (generalized) (M62.81)   Activity Tolerance Patient limited by lethargy   Patient Left in bed;with nursing/sitter in room   Nurse Communication  Time: 8548-8490 OT Time Calculation (min): 18 min  Charges: OT General Charges $OT Visit: 1 Visit OT Treatments $Self Care/Home Management : 8-22 mins  Rogers Clause, OT/L MSOT, 07/29/2024

## 2024-07-29 NOTE — Progress Notes (Signed)
 [facility] CARDIOLOGY PROGRESS NOTE   Patient ID: Cassandra [name] MRN: [id number] DOB/AGE: December 28, 1926 89 y.o.  Admit date: 07/26/2024 Referring Physician Dr. Madison Peaches Primary Physician Cleotilde Oneil FALCON, MD  Primary Cardiologist Dr. Wilburn Reason for Consultation Elevated troponin  HPI: [name] is a 89 y.o. female with a past medical history of coronary artery disease s/p PCI in 1999, aortic atherosclerosis, type 2 diabetes, ESRD on HD, hypertension, and hyperlipidemia who presented to the ED on 07/26/2024 for fatigue, weakness. Cardiology was consulted for further evaluation.   Interval History:  -Patient seen and examined this AM, resting in bed during dialysis. -More somnolent on exam today. Awakes to voice but falls back asleep quickly. -Denies CP, palpitations. No events noted on tele.  -BP and HR stable.   Review of systems complete and found to be negative unless listed above   Vitals:   07/29/24 0806 07/29/24 0823 07/29/24 0830 07/29/24 0900  BP: (!) 187/92 (!) 171/85 (!) 166/86 (!) 159/77  Pulse: 87 84 79 93  Resp: (!) 23 (!) 22 (!) 28 (!) 24  Temp: (!) 97 F (36.1 C)     TempSrc: Axillary     SpO2: 97% 95% 99% 100%  Weight: 58.4 kg     Height:         Intake/Output Summary (Last 24 hours) at 07/29/2024 0916 Last data filed at 07/29/2024 0400 Gross per 24 hour  Intake 457.45 ml  Output --  Net 457.45 ml     PHYSICAL EXAM General: Chronically ill appearing elderly female, well nourished, in no acute distress. HEENT: Normocephalic and atraumatic. Neck: No JVD.  Lungs: Normal respiratory effort on HFNC. Clear bilaterally to auscultation. No wheezes, crackles, rhonchi.  Heart: HRRR. Normal S1 and S2 without gallops or murmurs. Radial & DP pulses 2+ bilaterally. Abdomen: Non-distended appearing.  Msk: Normal strength and tone for age. Extremities: No clubbing, cyanosis or edema.   Neuro: Alert and oriented X 3. Psych: Mood appropriate, affect  congruent.    LABS: Basic Metabolic Panel: Recent Labs    07/27/24 0330 07/29/24 0413  NA 136 134*  K 4.6 5.6*  CL 97* 98  CO2 22 20*  GLUCOSE 65* 115*  BUN 28* 67*  CREATININE 5.60* 7.43*  CALCIUM  7.3* 7.9*   Liver Function Tests: Recent Labs    07/26/24 2015  AST 40  ALT 18  ALKPHOS 61  BILITOT 1.1  PROT 7.5  ALBUMIN  3.0*   No results for input(s): LIPASE, AMYLASE in the last 72 hours. CBC: Recent Labs    07/26/24 2015 07/26/24 2330 07/27/24 0330 07/28/24 0052 07/29/24 0413  WBC 34.4* 37.3*   < > 29.9* 30.9*  NEUTROABS 28.6* 30.4*  --   --   --   HGB 16.2* 13.9   < > 13.7 13.8  HCT 52.2* 45.1   < > 42.4 42.6  MCV 101.2* 102.3*   < > 99.5 97.9  PLT 1,075* 1,090*   < > 982* 973*   < > = values in this interval not displayed.   Cardiac Enzymes: Recent Labs    07/26/24 2015 07/26/24 2206  TROPONINIHS 200* 322*   BNP: No results for input(s): BNP in the last 72 hours. D-Dimer: No results for input(s): DDIMER in the last 72 hours. Hemoglobin A1C: No results for input(s): HGBA1C in the last 72 hours. Fasting Lipid Panel: No results for input(s): CHOL, HDL, LDLCALC, TRIG, CHOLHDL, LDLDIRECT in the last 72 hours. Thyroid Function Tests: No  results for input(s): TSH, T4TOTAL, T3FREE, THYROIDAB in the last 72 hours.  Invalid input(s): FREET3 Anemia Panel: No results for input(s): VITAMINB12, FOLATE, FERRITIN, TIBC, IRON, RETICCTPCT in the last 72 hours.  ECHOCARDIOGRAM COMPLETE Result Date: 07/27/2024    ECHOCARDIOGRAM REPORT   Patient Name:   Cassandra [name] Date of Exam: 07/27/2024 Medical Rec #:  [id number]         Height:       61.0 in Accession #:    [id number]        Weight:       130.1 lb Date of Birth:  [birth year]        BSA:          1.573 m Patient Age:    89 years          BP:           117/55 mmHg Patient Gender: F                 HR:           75 bpm. Exam Location:  [facility] Procedure: 2D Echo, Color  Doppler and Cardiac Doppler (Both Spectral and Color            Flow Doppler were utilized during procedure). Indications:     I21.4 NSTEMI  History:         Patient has prior history of Echocardiogram examinations. CAD,                  ESRD; Risk Factors:Hypertension.  Sonographer:     L. Thornton-Maynard Referring Phys:  [id number] [facility] MANSY Diagnosing Phys: Shelda Bruckner MD  Sonographer Comments: Global longitudinal strain was attempted. IMPRESSIONS  1. Left ventricular ejection fraction, by estimation, is 65 to 70%. The left ventricle has normal function. The left ventricle has no regional wall motion abnormalities. There is mild concentric left ventricular hypertrophy. Left ventricular diastolic parameters are consistent with Grade I diastolic dysfunction (impaired relaxation). The average left ventricular global longitudinal strain is -18.6 %. The global longitudinal strain is normal.  2. Right ventricular systolic function is normal. The right ventricular size is normal. There is moderately elevated pulmonary artery systolic pressure. The estimated right ventricular systolic pressure is 59.9 mmHg.  3. Right atrial size was mildly dilated.  4. The mitral valve is normal in structure. Trivial mitral valve regurgitation. No evidence of mitral stenosis.  5. Tricuspid valve regurgitation is moderate.  6. The aortic valve is tricuspid. There is moderate calcification of the aortic valve. Aortic valve regurgitation is mild. Aortic valve sclerosis/calcification is present, without any evidence of aortic stenosis.  7. There is Moderate (Grade III) plaque involving the aortic arch and descending aorta.  8. The inferior vena cava is normal in size with greater than 50% respiratory variability, suggesting right atrial pressure of 3 mmHg. Comparison(s): Changes from prior study are noted. TR now moderate, elevated RVSP. FINDINGS  Left Ventricle: Left ventricular ejection fraction, by estimation, is 65 to 70%. The  left ventricle has normal function. The left ventricle has no regional wall motion abnormalities. The average left ventricular global longitudinal strain is -18.6 %. Strain was performed and the global longitudinal strain is normal. The left ventricular internal cavity size was normal in size. There is mild concentric left ventricular hypertrophy. Left ventricular diastolic parameters are consistent with Grade I diastolic dysfunction (impaired relaxation). Right Ventricle: The right ventricular size is normal. No increase in right ventricular wall thickness. Right ventricular systolic  function is normal. There is moderately elevated pulmonary artery systolic pressure. The tricuspid regurgitant velocity is 3.77 m/s, and with an assumed right atrial pressure of 3 mmHg, the estimated right ventricular systolic pressure is 59.9 mmHg. Left Atrium: Left atrial size was normal in size. Right Atrium: Right atrial size was mildly dilated. Pericardium: There is no evidence of pericardial effusion. Mitral Valve: The mitral valve is normal in structure. Trivial mitral valve regurgitation. No evidence of mitral valve stenosis. Tricuspid Valve: The tricuspid valve is normal in structure. Tricuspid valve regurgitation is moderate . No evidence of tricuspid stenosis. Aortic Valve: Focal moderate calcification of noncoronary cusp. The aortic valve is tricuspid. There is moderate calcification of the aortic valve. Aortic valve regurgitation is mild. Aortic regurgitation PHT measures 474 msec. Aortic valve sclerosis/calcification is present, without any evidence of aortic stenosis. Aortic valve mean gradient measures 9.0 mmHg. Aortic valve peak gradient measures 16.6 mmHg. Aortic valve area, by VTI measures 1.89 cm. Pulmonic Valve: The pulmonic valve was not well visualized. Pulmonic valve regurgitation is trivial. No evidence of pulmonic stenosis. Aorta: The aortic root and ascending aorta are structurally normal, with no evidence  of dilitation. There is moderate (Grade III) plaque involving the aortic arch and descending aorta. Venous: The inferior vena cava is normal in size with greater than 50% respiratory variability, suggesting right atrial pressure of 3 mmHg. IAS/Shunts: The atrial septum is grossly normal.  LEFT VENTRICLE PLAX 2D LVIDd:         4.20 cm     Diastology LVIDs:         2.70 cm     LV e' medial:    5.33 cm/s LV PW:         1.40 cm     LV E/e' medial:  18.0 LV IVS:        1.40 cm     LV e' lateral:   6.20 cm/s LVOT diam:     2.00 cm     LV E/e' lateral: 15.5 LV SV:         76 LV SV Index:   49          2D Longitudinal Strain LVOT Area:     3.14 cm    2D Strain GLS (A4C):   -17.8 % LV IVRT:       77 msec     2D Strain GLS (A3C):   -21.6 %                            2D Strain GLS (A2C):   -16.5 %                            2D Strain GLS Avg:     -18.6 % LV Volumes (MOD) LV vol d, MOD A2C: 68.6 ml LV vol d, MOD A4C: 35.6 ml LV vol s, MOD A2C: 12.7 ml LV vol s, MOD A4C: 9.8 ml LV SV MOD A2C:     55.9 ml LV SV MOD A4C:     35.6 ml LV SV MOD BP:      38.9 ml RIGHT VENTRICLE             IVC RV Basal diam:  3.93 cm     IVC diam: 1.40 cm RV Mid diam:    3.00 cm RV S prime:     12.90 cm/s  PULMONARY VEINS TAPSE (  M-mode): 2.6 cm      Diastolic Velocity: 51.60 cm/s                             S/D Velocity:       1.40                             Systolic Velocity:  73.90 cm/s LEFT ATRIUM             Index        RIGHT ATRIUM           Index LA diam:        2.80 cm 1.78 cm/m   RA Area:     18.00 cm LA Vol (A2C):   57.1 ml 36.29 ml/m  RA Volume:   49.60 ml  31.53 ml/m LA Vol (A4C):   32.4 ml 20.59 ml/m LA Biplane Vol: 43.4 ml 27.59 ml/m  AORTIC VALVE                     PULMONIC VALVE AV Area (Vmax):    1.63 cm      PV Vmax:          1.00 m/s AV Area (Vmean):   1.78 cm      PV Peak grad:     4.0 mmHg AV Area (VTI):     1.89 cm      PR End Diast Vel: 4.30 msec AV Vmax:           204.00 cm/s AV Vmean:          140.500 cm/s AV VTI:             0.403 m AV Peak Grad:      16.6 mmHg AV Mean Grad:      9.0 mmHg LVOT Vmax:         106.00 cm/s LVOT Vmean:        79.700 cm/s LVOT VTI:          0.243 m LVOT/AV VTI ratio: 0.60 AI PHT:            474 msec  AORTA Ao Root diam: 3.00 cm Ao Asc diam:  3.30 cm MITRAL VALVE                TRICUSPID VALVE MV Area (PHT): 3.31 cm     TR Peak grad:   56.9 mmHg MV Decel Time: 229 msec     TR Vmax:        377.00 cm/s MV E velocity: 95.90 cm/s MV A velocity: 106.00 cm/s  SHUNTS MV E/A ratio:  0.90         Systemic VTI:  0.24 m                             Systemic Diam: 2.00 cm Shelda Bruckner MD Electronically signed by Shelda Bruckner MD Signature Date/Time: 07/27/2024/2:52:08 PM    Final    ECHO as above  TELEMETRY (personally reviewed): sinus rhythm rate 90s  EKG (personally reviewed): NSR PACs rate 89 bpm  DATA reviewed by me 07/29/24: last 24h vitals tele labs imaging I/O, hospitalist progress note  Principal Problem:   Sepsis due to pneumonia [facility]) Active Problems:   NSTEMI (non-ST elevated myocardial infarction) [facility])   Essential hypertension   Hypothyroidism  Dyslipidemia   Depression    ASSESSMENT AND PLAN: Cassandra [name] is a 89 y.o. female with a past medical history of coronary artery disease s/p PCI in 1999, aortic atherosclerosis, type 2 diabetes, ESRD on HD, hypertension, and hyperlipidemia who presented to the ED on 07/26/2024 for fatigue, weakness. Cardiology was consulted for further evaluation.   # Sepsis 2/2 pneumonia # Demand ischemia # Coronary artery disease Patient presented with weakness, fatigue.  Found to have sepsis suspected secondary to pneumonia.  Started on IV antibiotics.  Troponins mildly elevated and flat trending at 200 > 322.  Started on IV heparin .  No complaints of chest pain, or other anginal symptoms.  EKG nonischemic.  Echo this admission with preserved EF, no wall motion abnormalities. - Completed 48 hours of heparin  this AM.  -  Continue home aspirin  81 mg daily, Plavix 75 mg daily, Crestor  10 mg daily. - No plan for further cardiac diagnostics at this time. - Further management of pneumonia, sepsis as per primary team.  This patient's case was discussed and created with Dr. Florencio and he is in agreement.  Signed:  Danita Bloch, PA-C  07/29/2024, 9:16 AM [facility]

## 2024-07-29 NOTE — Progress Notes (Addendum)
 [facility]  - [place] at [facility]   PATIENT NAME: Cassandra [name]    MR#:  [id number]  DATE OF BIRTH:  1927/02/22  SUBJECTIVE:  no family at bedside. Patient  came in with increasing shortness of breath. Had some altered mental status. She is on dialysis completed her dialysis yesterday. Found to have multifocal pneumonia. Patient overall  breathing is stable vitals are okay.remins sleepy--opens eys and answers few questions    VITALS:  Blood pressure (!) 161/79, pulse 94, temperature 97.7 F (36.5 C), temperature source Axillary, resp. rate (!) 23, height 5' 1 (1.549 m), weight 57.4 kg, SpO2 98%.  PHYSICAL EXAMINATION:   GENERAL:  89 y.o.-year-old patient with no acute distress. Morbidly obese LUNGS: decreased breath sounds bilaterally, no wheezing CARDIOVASCULAR: S1, S2 normal. No murmur   ABDOMEN: Soft, nontender, nondistended. Bowel sounds present.  EXTREMITIES: No  edema b/l.   HD Access NEUROLOGIC: nonfocal  patient is sleepy SKIN: per RN  LABORATORY PANEL:  CBC Recent Labs  Lab 07/29/24 0413  WBC 30.9*  HGB 13.8  HCT 42.6  PLT 973*    Chemistries  Recent Labs  Lab 07/26/24 2015 07/27/24 0330 07/29/24 0413  NA 135   < > 134*  K 4.8   < > 5.6*  CL 91*   < > 98  CO2 26   < > 20*  GLUCOSE 71   < > 115*  BUN 20   < > 67*  CREATININE 5.46*   < > 7.43*  CALCIUM  8.1*   < > 7.9*  AST 40  --   --   ALT 18  --   --   ALKPHOS 61  --   --   BILITOT 1.1  --   --    < > = values in this interval not displayed.   Cardiac Enzymes No results for input(s): TROPONINI in the last 168 hours. RADIOLOGY:  No results found.   Assessment and Plan  ILINA XU is a 89 y.o. female with medical history significant for coronary artery disease, end-stage renal disease on hemodialysis on TTS, hypertension, dyslipidemia, hypothyroidism, lumbar stenosis, RLS and type 2 diabetes mellitus, who presented to the emergency room with acute onset of  fatigue and generalized weakness with altered mental status and lethargy.  Blood pressure was 79/38 with EMS.  Patient admitted to cough productive of whitish sputum   Portable chest x-ray showed the following: 1. Dense left mid to lower lung airspace disease most consistent with lingular pneumonia, with a small likely parapneumonic effusion. Follow-up study recommended to ensure clearing. 2. New mildly displaced posterolateral fractures of the right 6th and 7th ribs, age indeterminate but possibly subacute.  CT chest/abdomen: 1. Multifocal consolidation greatest in the left lower lobe and lingula, compatible with pneumonia. 2. Cholelithiasis without acute cholecystitis. 3. Bilateral renal atrophy. 4. Colonic diverticulosis. 5. Aortic atherosclerosis. Coronary artery disease.   Severe sepsis due to multifocal pneumonia (HCC) -pt currently on IV Rocephin  and Zithromax . -- Patient came in with altered mental status, respiratory distress, elevated white count, elevated lactic acid and multifocal pneumonia. - Mucolytic therapy be provided as well as duo nebs q.i.d. and q.4 hours p.r.n. - blood cultures negative so far- -continue hydration with IV lactated ringer . -- Pulmonary consultation--Dr Assaker--change to zosyn,vanc and azithromycin  --IV hydrocortisone --stress dose --MRSA PCR neg --Chest PT --overall stable hemodynamically --CXR in am --wbc trending down but masked by steroids   Elevated troponin w/o chest pain--likley demand ischemia with  sespsi --Iv heparin  for 48 hours per dr Fernand --completed - She is not having any current chest pain though she is diabetic. - continue aspirin  and Plavix. -  on  statin therapy   Depression - Continue Zoloft.   Dyslipidemia -continue statin therapy.   Hypothyroidism - continue Synthroid .   Essential hypertension - continue antihypertensive therapy.   Lethargy in the setting of PNA --hold sedating meds for  now   Procedures: Family communication  husband updated on the phone Consults :Pulmonary CODE STATUS: DNR DVT Prophylaxis :heparin   Level of care: Progressive Status is: Inpatient Remains inpatient appropriate because: severe sepsis due to multifoca pneumonia    TOTAL TIME TAKING CARE OF THIS PATIENT: 35 minutes.  >50% time spent on counselling and coordination of care  Note: This dictation was prepared with Dragon dictation along with smaller phrase technology. Any transcriptional errors that result from this process are unintentional.  Leita Blanch M.D    [facility]   CC: Primary care physician; Cleotilde Oneil FALCON, MD

## 2024-07-29 NOTE — Progress Notes (Signed)
 OT Cancellation Note  Patient Details Name: ZURIA FOSDICK MRN: [id number] DOB: 1927/03/02   Cancelled Treatment:    Reason Eval/Treat Not Completed: Patient at procedure or test/ unavailable;Other (comment) (dialysis) OT to reattempt when patient is available.  [name] Clause 07/29/2024, 12:50 PM

## 2024-07-29 NOTE — Progress Notes (Signed)
 [facility]  ROUNDING NOTE   Subjective:   Cassandra [name] is a 89 year old female with past medical conditions including anxiety, hypertension, RLS, neuropathy, and end-stage renal disease on hemodialysis.  Patient presents to the emergency department with weakness and confusion and has been admitted for ESRD (end stage renal disease) on dialysis (HCC) [N18.6, Z99.2] Sepsis due to pneumonia (HCC) [J18.9, A41.9] Pneumonia due to infectious organism, unspecified laterality, unspecified part of lung [J18.9] Sepsis, due to unspecified organism, unspecified whether acute organ dysfunction present [facility]) [A41.9]  Patient is known to our practice and receives dialysis at [facility] on a TTS, supervised by Dr Marcelino.    Update Patient seen and evaluated during dialysis   HEMODIALYSIS FLOWSHEET:  Blood Flow Rate (mL/min): 400 mL/min Arterial Pressure (mmHg): -233.93 mmHg Venous Pressure (mmHg): 234.33 mmHg TMP (mmHg): -20.6 mmHg Ultrafiltration Rate (mL/min): 554 mL/min Dialysate Flow Rate (mL/min): 300 ml/min  Remains quiet, will open eyes to name No verbal response  Objective:  Vital signs in last 24 hours:  Temp:  [97 F (36.1 C)-98.6 F (37 C)] 97.7 F (36.5 C) (11/04 1140) Pulse Rate:  [72-94] 94 (11/04 1140) Resp:  [18-28] 23 (11/04 1140) BP: (105-187)/(66-92) 161/79 (11/04 1140) SpO2:  [90 %-100 %] 98 % (11/04 1140) Weight:  [58.4 kg] 58.4 kg (11/04 0806)  Weight change:  Filed Weights   07/26/24 2010 07/29/24 0501 07/29/24 0806  Weight: 59 kg 58.4 kg 58.4 kg    Intake/Output: I/O last 3 completed shifts: In: 800.4 [I.V.:289.9; IV Piggyback:510.4] Out: -    Intake/Output this shift:  Total I/O In: -  Out: 1000 [Other:1000]  Physical Exam: General: NAD  Head: Normocephalic, atraumatic. Moist oral mucosal membranes  Eyes: Anicteric  Lungs:  Diminished, [place] O2  Heart: Regular rate and rhythm  Abdomen:  Soft, nontender  Extremities: Trace  peripheral edema.  Neurologic: Eyes open to name  Skin: Warm,dry, no rash  Access: Left upper aVF    Basic Metabolic Panel: Recent Labs  Lab 07/26/24 2015 07/27/24 0330 07/29/24 0413  NA 135 136 134*  K 4.8 4.6 5.6*  CL 91* 97* 98  CO2 26 22 20*  GLUCOSE 71 65* 115*  BUN 20 28* 67*  CREATININE 5.46* 5.60* 7.43*  CALCIUM  8.1* 7.3* 7.9*    Liver Function Tests: Recent Labs  Lab 07/26/24 2015  AST 40  ALT 18  ALKPHOS 61  BILITOT 1.1  PROT 7.5  ALBUMIN  3.0*   No results for input(s): LIPASE, AMYLASE in the last 168 hours. No results for input(s): AMMONIA in the last 168 hours.  CBC: Recent Labs  Lab 07/26/24 2015 07/26/24 2330 07/27/24 0330 07/28/24 0052 07/29/24 0413  WBC 34.4* 37.3* 30.3* 29.9* 30.9*  NEUTROABS 28.6* 30.4*  --   --   --   HGB 16.2* 13.9 14.1 13.7 13.8  HCT 52.2* 45.1 45.3 42.4 42.6  MCV 101.2* 102.3* 102.7* 99.5 97.9  PLT 1,075* 1,090* 971* 982* 973*    Cardiac Enzymes: No results for input(s): CKTOTAL, CKMB, CKMBINDEX, TROPONINI in the last 168 hours.  BNP: Invalid input(s): POCBNP  CBG: No results for input(s): GLUCAP in the last 168 hours.  Microbiology: Results for orders placed or performed during the hospital encounter of 07/26/24  Blood culture (routine x 2)     Status: None (Preliminary result)   Collection Time: 07/26/24  8:11 PM   Specimen: BLOOD  Result Value Ref Range Status   Specimen Description BLOOD RIGHT ANTECUBITAL  Final  Special Requests   Final    BOTTLES DRAWN AEROBIC AND ANAEROBIC Blood Culture adequate volume   Culture   Final    NO GROWTH 3 DAYS Performed at [facility], [address] [place], [place] [address]    Report Status PENDING  Incomplete  Resp panel by RT-PCR (RSV, Flu A&B, Covid) Anterior Nasal Swab     Status: None   Collection Time: 07/26/24  8:15 PM   Specimen: Anterior Nasal Swab  Result Value Ref Range Status   SARS Coronavirus 2 by RT PCR NEGATIVE  NEGATIVE Final    Comment: (NOTE) SARS-CoV-2 target nucleic acids are NOT DETECTED.  The SARS-CoV-2 RNA is generally detectable in upper respiratory specimens during the acute phase of infection. The lowest concentration of SARS-CoV-2 viral copies this assay can detect is 138 copies/mL. A negative result does not preclude SARS-Cov-2 infection and should not be used as the sole basis for treatment or other patient management decisions. A negative result may occur with  improper specimen collection/handling, submission of specimen other than nasopharyngeal swab, presence of viral mutation(s) within the areas targeted by this assay, and inadequate number of viral copies(<138 copies/mL). A negative result must be combined with clinical observations, patient history, and epidemiological information. The expected result is Negative.  Fact Sheet for Patients:  bloggercourse.com  Fact Sheet for Healthcare Providers:  seriousbroker.it  This test is no t yet approved or cleared by the [place]  FDA and  has been authorized for detection and/or diagnosis of SARS-CoV-2 by FDA under an Emergency Use Authorization (EUA). This EUA will remain  in effect (meaning this test can be used) for the duration of the COVID-19 declaration under Section 564(b)(1) of the Act, 21 U.S.C.section 360bbb-3(b)(1), unless the authorization is terminated  or revoked sooner.       Influenza A by PCR NEGATIVE NEGATIVE Final   Influenza B by PCR NEGATIVE NEGATIVE Final    Comment: (NOTE) The Xpert Xpress SARS-CoV-2/FLU/RSV plus assay is intended as an aid in the diagnosis of influenza from Nasopharyngeal swab specimens and should not be used as a sole basis for treatment. Nasal washings and aspirates are unacceptable for Xpert Xpress SARS-CoV-2/FLU/RSV testing.  Fact Sheet for Patients: bloggercourse.com  Fact Sheet for Healthcare  Providers: seriousbroker.it  This test is not yet approved or cleared by the [place]  FDA and has been authorized for detection and/or diagnosis of SARS-CoV-2 by FDA under an Emergency Use Authorization (EUA). This EUA will remain in effect (meaning this test can be used) for the duration of the COVID-19 declaration under Section 564(b)(1) of the Act, 21 U.S.C. section 360bbb-3(b)(1), unless the authorization is terminated or revoked.     Resp Syncytial Virus by PCR NEGATIVE NEGATIVE Final    Comment: (NOTE) Fact Sheet for Patients: bloggercourse.com  Fact Sheet for Healthcare Providers: seriousbroker.it  This test is not yet approved or cleared by the [place]  FDA and has been authorized for detection and/or diagnosis of SARS-CoV-2 by FDA under an Emergency Use Authorization (EUA). This EUA will remain in effect (meaning this test can be used) for the duration of the COVID-19 declaration under Section 564(b)(1) of the Act, 21 U.S.C. section 360bbb-3(b)(1), unless the authorization is terminated or revoked.  Performed at [facility], [address] [place], [place] [address]   Blood culture (routine x 2)     Status: None (Preliminary result)   Collection Time: 07/26/24  8:16 PM   Specimen: BLOOD  Result Value Ref  Range Status   Specimen Description BLOOD RIGHT ANTECUBITAL  Final   Special Requests   Final    BOTTLES DRAWN AEROBIC AND ANAEROBIC Blood Culture results may not be optimal due to an inadequate volume of blood received in culture bottles   Culture   Final    NO GROWTH 3 DAYS Performed at [facility], [address], [place], [place] [address]    Report Status PENDING  Incomplete  MRSA Next Gen by PCR, Nasal     Status: None   Collection Time: 07/27/24  5:00 PM   Specimen: Nasal Mucosa; Nasal Swab  Result Value Ref Range Status   MRSA by PCR Next Gen NOT  DETECTED NOT DETECTED Final    Comment: (NOTE) The GeneXpert MRSA Assay (FDA approved for NASAL specimens only), is one component of a comprehensive MRSA colonization surveillance program. It is not intended to diagnose MRSA infection nor to guide or monitor treatment for MRSA infections. Test performance is not FDA approved in patients less than 4 years old. Performed at [facility], [address] [place], [place] [address]     Coagulation Studies: Recent Labs    07/27/24 0329  LABPROT 17.7*  INR 1.4*    Urinalysis: No results for input(s): COLORURINE, LABSPEC, PHURINE, GLUCOSEU, HGBUR, BILIRUBINUR, KETONESUR, PROTEINUR, UROBILINOGEN, NITRITE, LEUKOCYTESUR in the last 72 hours.  Invalid input(s): APPERANCEUR    Imaging: ECHOCARDIOGRAM COMPLETE Result Date: 07/27/2024    ECHOCARDIOGRAM REPORT   Patient Name:   Cassandra [name] Date of Exam: 07/27/2024 Medical Rec #:  [id number]         Height:       61.0 in Accession #:    [id number]        Weight:       130.1 lb Date of Birth:  August 30, 1927        BSA:          1.573 m Patient Age:    89 years          BP:           117/55 mmHg Patient Gender: F                 HR:           75 bpm. Exam Location:  [facility] Procedure: 2D Echo, Color Doppler and Cardiac Doppler (Both Spectral and Color            Flow Doppler were utilized during procedure). Indications:     I21.4 NSTEMI  History:         Patient has prior history of Echocardiogram examinations. CAD,                  ESRD; Risk Factors:Hypertension.  Sonographer:     L. Thornton-Maynard Referring Phys:  [id number] [facility] Diagnosing Phys: Shelda Bruckner MD  Sonographer Comments: Global longitudinal strain was attempted. IMPRESSIONS  1. Left ventricular ejection fraction, by estimation, is 65 to 70%. The left ventricle has normal function. The left ventricle has no regional wall motion abnormalities. There is mild concentric left ventricular  hypertrophy. Left ventricular diastolic parameters are consistent with Grade I diastolic dysfunction (impaired relaxation). The average left ventricular global longitudinal strain is -18.6 %. The global longitudinal strain is normal.  2. Right ventricular systolic function is normal. The right ventricular size is normal. There is moderately elevated pulmonary artery systolic pressure. The estimated right ventricular systolic pressure is 59.9 mmHg.  3. Right atrial  size was mildly dilated.  4. The mitral valve is normal in structure. Trivial mitral valve regurgitation. No evidence of mitral stenosis.  5. Tricuspid valve regurgitation is moderate.  6. The aortic valve is tricuspid. There is moderate calcification of the aortic valve. Aortic valve regurgitation is mild. Aortic valve sclerosis/calcification is present, without any evidence of aortic stenosis.  7. There is Moderate (Grade III) plaque involving the aortic arch and descending aorta.  8. The inferior vena cava is normal in size with greater than 50% respiratory variability, suggesting right atrial pressure of 3 mmHg. Comparison(s): Changes from prior study are noted. TR now moderate, elevated RVSP. FINDINGS  Left Ventricle: Left ventricular ejection fraction, by estimation, is 65 to 70%. The left ventricle has normal function. The left ventricle has no regional wall motion abnormalities. The average left ventricular global longitudinal strain is -18.6 %. Strain was performed and the global longitudinal strain is normal. The left ventricular internal cavity size was normal in size. There is mild concentric left ventricular hypertrophy. Left ventricular diastolic parameters are consistent with Grade I diastolic dysfunction (impaired relaxation). Right Ventricle: The right ventricular size is normal. No increase in right ventricular wall thickness. Right ventricular systolic function is normal. There is moderately elevated pulmonary artery systolic pressure.  The tricuspid regurgitant velocity is 3.77 m/s, and with an assumed right atrial pressure of 3 mmHg, the estimated right ventricular systolic pressure is 59.9 mmHg. Left Atrium: Left atrial size was normal in size. Right Atrium: Right atrial size was mildly dilated. Pericardium: There is no evidence of pericardial effusion. Mitral Valve: The mitral valve is normal in structure. Trivial mitral valve regurgitation. No evidence of mitral valve stenosis. Tricuspid Valve: The tricuspid valve is normal in structure. Tricuspid valve regurgitation is moderate . No evidence of tricuspid stenosis. Aortic Valve: Focal moderate calcification of noncoronary cusp. The aortic valve is tricuspid. There is moderate calcification of the aortic valve. Aortic valve regurgitation is mild. Aortic regurgitation PHT measures 474 msec. Aortic valve sclerosis/calcification is present, without any evidence of aortic stenosis. Aortic valve mean gradient measures 9.0 mmHg. Aortic valve peak gradient measures 16.6 mmHg. Aortic valve area, by VTI measures 1.89 cm. Pulmonic Valve: The pulmonic valve was not well visualized. Pulmonic valve regurgitation is trivial. No evidence of pulmonic stenosis. Aorta: The aortic root and ascending aorta are structurally normal, with no evidence of dilitation. There is moderate (Grade III) plaque involving the aortic arch and descending aorta. Venous: The inferior vena cava is normal in size with greater than 50% respiratory variability, suggesting right atrial pressure of 3 mmHg. IAS/Shunts: The atrial septum is grossly normal.  LEFT VENTRICLE PLAX 2D LVIDd:         4.20 cm     Diastology LVIDs:         2.70 cm     LV e' medial:    5.33 cm/s LV PW:         1.40 cm     LV E/e' medial:  18.0 LV IVS:        1.40 cm     LV e' lateral:   6.20 cm/s LVOT diam:     2.00 cm     LV E/e' lateral: 15.5 LV SV:         76 LV SV Index:   49          2D Longitudinal Strain LVOT Area:     3.14 cm    2D Strain GLS (A4C):    -  17.8 % LV IVRT:       77 msec     2D Strain GLS (A3C):   -21.6 %                            2D Strain GLS (A2C):   -16.5 %                            2D Strain GLS Avg:     -18.6 % LV Volumes (MOD) LV vol d, MOD A2C: 68.6 ml LV vol d, MOD A4C: 35.6 ml LV vol s, MOD A2C: 12.7 ml LV vol s, MOD A4C: 9.8 ml LV SV MOD A2C:     55.9 ml LV SV MOD A4C:     35.6 ml LV SV MOD BP:      38.9 ml RIGHT VENTRICLE             IVC RV Basal diam:  3.93 cm     IVC diam: 1.40 cm RV Mid diam:    3.00 cm RV S prime:     12.90 cm/s  PULMONARY VEINS TAPSE (M-mode): 2.6 cm      Diastolic Velocity: 51.60 cm/s                             S/D Velocity:       1.40                             Systolic Velocity:  73.90 cm/s LEFT ATRIUM             Index        RIGHT ATRIUM           Index LA diam:        2.80 cm 1.78 cm/m   RA Area:     18.00 cm LA Vol (A2C):   57.1 ml 36.29 ml/m  RA Volume:   49.60 ml  31.53 ml/m LA Vol (A4C):   32.4 ml 20.59 ml/m LA Biplane Vol: 43.4 ml 27.59 ml/m  AORTIC VALVE                     PULMONIC VALVE AV Area (Vmax):    1.63 cm      PV Vmax:          1.00 m/s AV Area (Vmean):   1.78 cm      PV Peak grad:     4.0 mmHg AV Area (VTI):     1.89 cm      PR End Diast Vel: 4.30 msec AV Vmax:           204.00 cm/s AV Vmean:          140.500 cm/s AV VTI:            0.403 m AV Peak Grad:      16.6 mmHg AV Mean Grad:      9.0 mmHg LVOT Vmax:         106.00 cm/s LVOT Vmean:        79.700 cm/s LVOT VTI:          0.243 m LVOT/AV VTI ratio: 0.60 AI PHT:            474 msec  AORTA Ao Root diam: 3.00 cm Ao  Asc diam:  3.30 cm MITRAL VALVE                TRICUSPID VALVE MV Area (PHT): 3.31 cm     TR Peak grad:   56.9 mmHg MV Decel Time: 229 msec     TR Vmax:        377.00 cm/s MV E velocity: 95.90 cm/s MV A velocity: 106.00 cm/s  SHUNTS MV E/A ratio:  0.90         Systemic VTI:  0.24 m                             Systemic Diam: 2.00 cm Shelda Bruckner MD Electronically signed by Shelda Bruckner MD Signature  Date/Time: 07/27/2024/2:52:08 PM    Final      Medications:    sodium chloride  10 mL/hr at 07/29/24 0400   azithromycin  Stopped (07/28/24 2343)   piperacillin-tazobactam (ZOSYN)  IV 2.25 g (07/29/24 0603)    aspirin  EC  81 mg Oral Daily   calcium  acetate  1,334 mg Oral TID WC   Chlorhexidine  Gluconate Cloth  6 each Topical Q0600   clopidogrel  75 mg Oral Daily   feeding supplement (NEPRO CARB STEADY)  237 mL Oral BID BM   gabapentin   100 mg Oral BID   And   gabapentin   200 mg Oral QHS   guaiFENesin  600 mg Oral BID   hydrocortisone sod succinate (SOLU-CORTEF) inj  100 mg Intravenous Q12H   hydroxyurea   300 mg Oral Daily   ipratropium-albuterol   3 mL Nebulization TID   levothyroxine   88 mcg Oral Q0600   rOPINIRole   0.5 mg Oral BID   rosuvastatin   10 mg Oral Daily   sertraline  50 mg Oral Daily   traZODone   25-50 mg Oral QHS   sodium chloride , acetaminophen  **OR** acetaminophen , ALPRAZolam , diphenhydrAMINE , heparin , heparin , hydrALAZINE , HYDROcodone -acetaminophen , hydrOXYzine, lidocaine , lidocaine -prilocaine , lidocaine -prilocaine , loperamide, magnesium hydroxide, ondansetron  **OR** ondansetron  (ZOFRAN ) IV, pentafluoroprop-tetrafluoroeth, pentafluoroprop-tetrafluoroeth  Assessment/ Plan:  Cassandra [name] is a 89 y.o.  female with past medical conditions including anxiety, hypertension, RLS, neuropathy, and end-stage renal disease on hemodialysis.  Patient presents to the emergency department with weakness and confusion and has been admitted for ESRD (end stage renal disease) on dialysis (HCC) [N18.6, Z99.2] Sepsis due to pneumonia (HCC) [J18.9, A41.9] Pneumonia due to infectious organism, unspecified laterality, unspecified part of lung [J18.9] Sepsis, due to unspecified organism, unspecified whether acute organ dysfunction present (HCC) [A41.9]  [facility]/TTS/left aVF   End-stage renal disease on hemodialysis. Receiving dialysis today, UF 1L as tolerated. Next  treatment scheduled for Thursday.   2. Anemia of chronic kidney disease Lab Results  Component Value Date   HGB 13.8 07/29/2024    Hemoglobin at goal. Will continue to monitor.   3. Secondary Hyperparathyroidism: with outpatient labs: None available Lab Results  Component Value Date   CALCIUM  7.9 (L) 07/29/2024   CAION 1.11 (L) 05/25/2023   PHOS 7.0 (H) 07/17/2024    Phos elevated. Continue calcium  acetate with meals.  4. Hypertension with chronic kidney disease. Home regimen includes amlodipine  and clonidine . BP 147/74 during dialysis.    LOS: 3 Kasara Schomer 11/4/202511:49 AM

## 2024-07-30 ENCOUNTER — Encounter: Payer: Self-pay | Admitting: Radiology

## 2024-07-30 ENCOUNTER — Inpatient Hospital Stay

## 2024-07-30 DIAGNOSIS — Z992 Dependence on renal dialysis: Secondary | ICD-10-CM

## 2024-07-30 DIAGNOSIS — Z515 Encounter for palliative care: Secondary | ICD-10-CM | POA: Diagnosis not present

## 2024-07-30 DIAGNOSIS — J189 Pneumonia, unspecified organism: Secondary | ICD-10-CM | POA: Diagnosis not present

## 2024-07-30 DIAGNOSIS — A419 Sepsis, unspecified organism: Secondary | ICD-10-CM | POA: Diagnosis not present

## 2024-07-30 DIAGNOSIS — N186 End stage renal disease: Secondary | ICD-10-CM | POA: Diagnosis not present

## 2024-07-30 LAB — BLOOD GAS, VENOUS
Bicarbonate: 37.2 mmol/L — ABNORMAL HIGH (ref 20.0–28.0)
O2 Content: 6 L/min
O2 Saturation: 26.3 % — AB (ref 0.0–2.0)
Patient temperature: 37
Patient temperature: 37 %
pCO2, Ven: 60 mmHg (ref 44–60)
pH, Ven: 7.4 (ref 7.25–7.43)
pO2, Ven: <37.2 mmHg — CL (ref 32–45)

## 2024-07-30 MED ORDER — LORAZEPAM 2 MG/ML IJ SOLN
1.0000 mg | Freq: Once | INTRAMUSCULAR | Status: AC | PRN
  Administered 2024-07-30: 1 mg via INTRAVENOUS
  Filled 2024-07-30: qty 1

## 2024-07-30 NOTE — TOC Progression Note (Signed)
 Transition of Care [facility]) - Progression Note    Patient Details  Name: Cassandra [name] MRN: [id number] Date of Birth: [birth year]  [facility] CM/SW Contact  Shasta DELENA Daring, RN Phone Number: 07/30/2024, 11:43 AM  Clinical Narrative:    RNCM discussed SNF recommendation with patient's  husband. He is agreeable. Patient was previously at [facility] and the do NOT want to return there. Will initiate search.     Barriers to Discharge: Continued Medical Work up               Expected Discharge Plan and Services     Post Acute Care Choice: Resumption of Svcs/PTA Provider Living arrangements for the past 2 months: Apartment                           HH Arranged: RN, PT [facility] Agency: [facility] Date [facility] Agency Contacted: 07/28/24   Representative spoke with at [facility] Agency: [place]   Social Drivers of Health (SDOH) Interventions SDOH Screenings   Food Insecurity: No Food Insecurity (07/27/2024)  Housing: Low Risk  (07/27/2024)  Transportation Needs: No Transportation Needs (07/27/2024)  Utilities: Not At Risk (07/27/2024)  Financial Resource Strain: Low Risk  (06/25/2024)   Received from [facility]  Social Connections: Moderately Integrated (07/27/2024)  Tobacco Use: Medium Risk (07/28/2024)    Readmission Risk Interventions    07/28/2024    2:52 PM 03/09/2023    4:09 PM  Readmission Risk Prevention Plan  Transportation Screening Complete Complete  PCP or Specialist Appt within 3-5 Days  Complete  HRI or Home Care Consult  Complete  Social Work Consult for Recovery Care Planning/Counseling  Complete  Palliative Care Screening  Not Applicable  Medication Review Oceanographer) Complete Complete  PCP or Specialist appointment within 3-5 days of discharge Complete   HRI or Home Care Consult Complete   SW Recovery Care/Counseling Consult Complete   Palliative Care Screening Not Applicable   Skilled Nursing Facility Not  Applicable

## 2024-07-30 NOTE — Progress Notes (Signed)
 Speech Language Pathology Treatment: Dysphagia  Patient Details Name: Cassandra [name] MRN: [id number] DOB: [birth year] Today's Date: 07/30/2024 Time: 8599-8574 SLP Time Calculation (min) (ACUTE ONLY): 25 min  Assessment / Plan / Recommendation Clinical Impression  Pt seen for ongoing assessment of swallowing. She was alert, verbally responsive and engaged in conversation w/ SLP, NSG, and NP(Palliative Care). Noted to be min distracted but redirected attention appropriately w/ cue. Pt helped to feed self w/ setup this session.  On [place] O2 3-4L; wbc wnl. Afebrile.  Pt does NOT have her Upper Denture Plate present- husband to bring in. Pt is Missing Most Dentition.  Pt explained general aspiration precautions and agreed verbally to the need for following them especially sitting upright for all oral intake and taking small bites/sips slowly- do Not rush eating/drinking. Supported pt's sitting upright in bed d/t generalized weakness.  She helped to feed herself trials of puree and thin liquids via straw w/ No overt, clinical s/s of aspiration noted w/ intake; respiratory status remained calm and unlabored, vocal quality clear b/t trials, no coughing. Oral phase appeared [facility] for bolus management and timely A-P transfer for swallowing; oral clearing achieved w/ all trials. NSG denied any deficits in swallowing current diet as well.   Pt appears at reduced risk for aspriation when following general aspiration precautions using a modified Dysphagia diet in setting of Missing Dentition. Recommend continue current diet w/ trials to upgrade food consistencies of diet when pt is able to use her Upper Denture plate again; Thin liquids. IF pt does not wear the Upper Denture plate typically at meals, then a possible Minced diet consistency could be trialed during admit as pt's respiratory status has improved. Recommend general aspiration precautions; tray setup and sitting up support to Help Feed Self. Pills Whole in  Puree as needed for ease of swallowing.  ST services will monitor pt's status and assess for diet upgrade while admitted. NSG updated, agreed. Precautions posted at bedside, chart.     HPI HPI: Per H&P, Cassandra [name] is a 89 y.o. female with medical history significant for Multiple medical issues including coronary artery disease, end-stage renal disease on hemodialysis on TTS, hypertension, dyslipidemia, hypothyroidism, lumbar stenosis, RLS and type 2 diabetes mellitus, who presented to the emergency room with acute onset of fatigue and generalized weakness with altered mental status and lethargy..  CT Chest: consolidation in the left lower lobe and lingula and to a lesser extent right  lower lobe compatible with pneumonia. No pleural effusion or pneumothorax.  OF NOTE: pt wears an Upper Denture plate (at home); Missing Lower Dentition.      SLP Plan  Continue with current plan of care          Recommendations  Diet recommendations: Dysphagia 1 (puree);Thin liquid (trials to upgrade w/ Upper Denture Plate brought in) Liquids provided via: Cup;Straw Medication Administration: Whole meds with puree (as needed) Supervision: Patient able to self feed;Staff to assist with self feeding;Intermittent supervision to cue for compensatory strategies Compensations: Minimize environmental distractions;Slow rate;Small sips/bites;Lingual sweep for clearance of pocketing;Follow solids with liquid Postural Changes and/or Swallow Maneuvers: Out of bed for meals;Seated upright 90 degrees;Upright 30-60 min after meal                 (Dietician) Oral care BID;Oral care before and after PO;Patient independent with oral care;Staff/trained caregiver to provide oral care (support pt)   Intermittent Supervision/Assistance Dysphagia, unspecified (R13.10) (Missing Dentition- no upper denture plate present; deconditioned)  Continue with current plan of care       Comer Portugal, MS,  CCC-SLP Speech Language Pathologist Rehab Services; [facility] [phone] (ascom) Delayza Lungren  07/30/2024, 3:04 PM

## 2024-07-30 NOTE — Progress Notes (Signed)
 I was present during limited ultrasound of the chest prior to possible thoracentesis.   Left pleural effusion was trace. There was no significant pocket of fluid to allow for safe approach for thoracentesis. No procedure performed.   Cassandra Bacot B [name] NP 07/30/2024 3:56 PM

## 2024-07-30 NOTE — Progress Notes (Signed)
 [facility] CLINIC CARDIOLOGY PROGRESS NOTE   Patient ID: Cassandra [name] MRN: [id number] DOB/AGE: 88-Nov-1928 88 y.o.  Admit date: 07/26/2024 Referring Physician Dr. Madison Peaches Primary Physician Cleotilde Oneil FALCON, MD  Primary Cardiologist Dr. Wilburn Reason for Consultation Elevated troponin  HPI: Cassandra [name] is a 89 y.o. female with a past medical history of coronary artery disease s/p PCI in 1999, aortic atherosclerosis, type 2 diabetes, ESRD on HD, hypertension, and hyperlipidemia who presented to the ED on 07/26/2024 for fatigue, weakness. Cardiology was consulted for further evaluation.   Interval History:  -Patient seen and examined this AM, resting in bed comfortably. -Awake and alert today. SOB improved and O2 requirement improving. -Denies CP, palpitations. No events noted on tele.  -BP and HR stable.   Review of systems complete and found to be negative unless listed above   Vitals:   07/29/24 2045 07/29/24 2313 07/30/24 0400 07/30/24 0405  BP: (!) 168/77 135/70 138/76   Pulse: 94 86 92   Resp: 20 20 20    Temp: 99.1 F (37.3 C) 98.9 F (37.2 C) 98.6 F (37 C)   TempSrc:      SpO2: 100% 98% 91% 95%  Weight:      Height:         Intake/Output Summary (Last 24 hours) at 07/30/2024 0751 Last data filed at 07/30/2024 0010 Gross per 24 hour  Intake 625 ml  Output 1000 ml  Net -375 ml     PHYSICAL EXAM General: Chronically ill appearing elderly female, well nourished, in no acute distress. HEENT: Normocephalic and atraumatic. Neck: No JVD.  Lungs: Normal respiratory effort on HFNC. Clear bilaterally to auscultation. No wheezes, crackles, rhonchi.  Heart: HRRR. Normal S1 and S2 without gallops or murmurs. Radial & DP pulses 2+ bilaterally. Abdomen: Non-distended appearing.  Msk: Normal strength and tone for age. Extremities: No clubbing, cyanosis or edema.   Neuro: Alert and oriented X 3. Psych: Mood appropriate, affect congruent.    LABS: Basic Metabolic  Panel: Recent Labs    07/29/24 0413  NA 134*  K 5.6*  CL 98  CO2 20*  GLUCOSE 115*  BUN 67*  CREATININE 7.43*  CALCIUM  7.9*   Liver Function Tests: No results for input(s): AST, ALT, ALKPHOS, BILITOT, PROT, ALBUMIN  in the last 72 hours.  No results for input(s): LIPASE, AMYLASE in the last 72 hours. CBC: Recent Labs    07/28/24 0052 07/29/24 0413  WBC 29.9* 30.9*  HGB 13.7 13.8  HCT 42.4 42.6  MCV 99.5 97.9  PLT 982* 973*   Cardiac Enzymes: No results for input(s): CKTOTAL, CKMB, CKMBINDEX, TROPONINIHS in the last 72 hours.  BNP: No results for input(s): BNP in the last 72 hours. D-Dimer: No results for input(s): DDIMER in the last 72 hours. Hemoglobin A1C: No results for input(s): HGBA1C in the last 72 hours. Fasting Lipid Panel: No results for input(s): CHOL, HDL, LDLCALC, TRIG, CHOLHDL, LDLDIRECT in the last 72 hours. Thyroid Function Tests: No results for input(s): TSH, T4TOTAL, T3FREE, THYROIDAB in the last 72 hours.  Invalid input(s): FREET3 Anemia Panel: No results for input(s): VITAMINB12, FOLATE, FERRITIN, TIBC, IRON, RETICCTPCT in the last 72 hours.  DG Chest Port 1 View Result Date: 07/30/2024 EXAM: 1 VIEW(S) XRAY OF THE CHEST 07/30/2024 12:53:00 AM COMPARISON: 07/26/2024. CLINICAL HISTORY: Pneumonia. FINDINGS: LUNGS AND PLEURA: Diffuse peripheral predominant interstitial reticulation is favored to represent sequelae of chronic lung disease possibly early pulmonary fibrosis. Superimposed mild edema not excluded. Progressive airspace consolidation within  the left mid and left lower lung concerning for worsening pneumonia. Moderate left pleural effusion. No pneumothorax. HEART AND MEDIASTINUM: Cardiomegaly. Calcified aorta. BONES AND SOFT TISSUES: Old right rib fractures. IMPRESSION: 1. Progressive left mid and lower lung consolidation concerning for worsening pneumonia. 2. Moderate left pleural  effusion. Electronically signed by: Waddell Calk MD 07/30/2024 06:29 AM EST RP Workstation: [id number]   ECHO as above  TELEMETRY (personally reviewed): sinus rhythm rate 90s  EKG (personally reviewed): NSR PACs rate 89 bpm  DATA reviewed by me 07/30/24: last 24h vitals tele labs imaging I/O, hospitalist progress note  Principal Problem:   Sepsis due to pneumonia [facility]) Active Problems:   NSTEMI (non-ST elevated myocardial infarction) (HCC)   Essential hypertension   Hypothyroidism   Dyslipidemia   Depression    ASSESSMENT AND PLAN: Cassandra [name] is a 89 y.o. female with a past medical history of coronary artery disease s/p PCI in 1999, aortic atherosclerosis, type 2 diabetes, ESRD on HD, hypertension, and hyperlipidemia who presented to the ED on 07/26/2024 for fatigue, weakness. Cardiology was consulted for further evaluation.   # Sepsis 2/2 pneumonia # Demand ischemia # Coronary artery disease Patient presented with weakness, fatigue.  Found to have sepsis suspected secondary to pneumonia.  Started on IV antibiotics.  Troponins mildly elevated and flat trending at 200 > 322.  Started on IV heparin .  No complaints of chest pain, or other anginal symptoms.  EKG nonischemic.  Echo this admission with preserved EF, no wall motion abnormalities. - O2 requirement improving. - Completed 48 hours of heparin  yesterday AM.  - Continue home aspirin  81 mg daily, Plavix 75 mg daily, Crestor  10 mg daily. - No plan for further cardiac diagnostics at this time. - Further management of pneumonia, sepsis as per primary team.  Cardiology will sign off. Please haiku with questions or re-engage if needed. Follow up with Dr. Wilburn in 1-2 weeks.   This patient's case was discussed and created with Dr. Florencio and he is in agreement.  Signed:  Danita Bloch, PA-C  07/30/2024, 7:51 AM [facility] Cardiology

## 2024-07-30 NOTE — Progress Notes (Signed)
 Physical Therapy Treatment Patient Details Name: [name] MRN: [id number] DOB: [birth year] Today's Date: 07/30/2024   History of Present Illness Cassandra [name] is a 89 y.o. female with medical history significant for coronary artery disease, end-stage renal disease on hemodialysis on TTS, hypertension, dyslipidemia, hypothyroidism, lumbar stenosis, RLS and type 2 diabetes mellitus, who presented to the emergency room with acute onset of fatigue and generalized weakness with altered mental status and lethargy.  Blood pressure was 79/38 with EMS.  Patient admitted to cough productive of whitish sputum.  No chest pain or palpitations..  She admitted to chest pain yesterday though.  She had low-grade fever of 99.5.  No dysuria, urinary frequency or urgency, hematuria or flank pain.  No diarrhea or melena or bright red bleeding per rectum.  No other bleeding diathesis.    PT Comments  Co tx with OT to address safety with mobility and ADLs. Pt with improved cognition compared to yesterday's session and more conversational. Pt continues to demo dec strength/activity tolerance/balance. Pt with episode of incontinence while in bed requiring assistance with hygiene/transfer to [facility] to continue. Pt able to perform step pivot transfer with mod-max A  x2 for safety using RW. Multiple STS performed during session varying from needing min-modA.  Pt has posterior bias upon standing requiring constant mod A to prevent LOB backwards. Will continue to follow pt acutely to promote return to optimal level of function. Discharge disposition remains appropriate.    If plan is discharge home, recommend the following: Two people to help with walking and/or transfers;Two people to help with bathing/dressing/bathroom;Direct supervision/assist for medications management;Direct supervision/assist for financial management;Assist for transportation;Supervision due to cognitive status   Can travel by private vehicle     No   Equipment Recommendations  Other (comment) (TBD at next venue)    Recommendations for Other Services       Precautions / Restrictions Precautions Precautions: Fall Recall of Precautions/Restrictions: Impaired     Mobility  Bed Mobility Overal bed mobility: Needs Assistance Bed Mobility: Supine to Sit, Sit to Supine, Rolling Rolling: Max assist   Supine to sit: Max assist, HOB elevated Sit to supine: Max assist, HOB elevated   General bed mobility comments: max A to roll onto side. cuing for sequencing and hand placement. Max A for supine<>sit with max encouragement for OOB activity.    Transfers Overall transfer level: Needs assistance Equipment used: Rolling walker (2 wheels) Transfers: Bed to chair/wheelchair/BSC Sit to Stand: Min assist, Mod assist   Step pivot transfers: Mod assist, Max assist, +2 safety/equipment       General transfer comment: Multiple STS performed from [facility]. Varied from min-mod A. mod-max A for safety. Pt requires RW management and cuing for sequencing.    Ambulation/Gait               General Gait Details: NT. limited session d/t needing to perform hygiene.   Stairs             Wheelchair Mobility     Tilt Bed    Modified Rankin (Stroke Patients Only)       Balance Overall balance assessment: Needs assistance Sitting-balance support: Feet unsupported, Bilateral upper extremity supported Sitting balance-Leahy Scale: Fair Sitting balance - Comments: required steadying A   Standing balance support: Bilateral upper extremity supported Standing balance-Leahy Scale: Poor Standing balance comment: Heavy reliance on RW for balance. Posterior bias upon standing. Inc postural sway with no UEs. Pt requires mod A to maintain static stance  to perform pericare                            Communication Communication Communication: Impaired  Cognition Arousal: Alert Behavior During Therapy: WFL for tasks  assessed/performed   PT - Cognitive impairments: History of cognitive impairments Difficult to assess due to: Impaired communication                     PT - Cognition Comments: Pt more alert compared to yesterday and more conversational. Pt A&Ox3. Following commands: Impaired Following commands impaired: Follows one step commands inconsistently    Cueing Cueing Techniques: Verbal cues, Tactile cues  Exercises Other Exercises Other Exercises: Total A for pericare.    General Comments General comments (skin integrity, edema, etc.): Pt on 3L of O2 upon entry. Nurse in room to turn O2 up to 4L for mobility.      Pertinent Vitals/Pain Pain Assessment Pain Assessment: Faces Faces Pain Scale: Hurts even more Pain Location: buttocks Pain Descriptors / Indicators: Moaning Pain Intervention(s): Monitored during session (pain with peri care. Nursing notified and present to apply sacral pad)    Home Living                          Prior Function            PT Goals (current goals can now be found in the care plan section) Acute Rehab PT Goals PT Goal Formulation: Patient unable to participate in goal setting Time For Goal Achievement: 08/12/24 Potential to Achieve Goals: Fair Progress towards PT goals: Progressing toward goals    Frequency    Min 2X/week      PT Plan      Co-evaluation PT/OT/SLP Co-Evaluation/Treatment: Yes Reason for Co-Treatment: Complexity of the patient's impairments (multi-system involvement);For patient/therapist safety;To address functional/ADL transfers PT goals addressed during session: Mobility/safety with mobility OT goals addressed during session: ADL's and self-care      AM-PAC PT 6 Clicks Mobility   Outcome Measure  Help needed turning from your back to your side while in a flat bed without using bedrails?: A Lot Help needed moving from lying on your back to sitting on the side of a flat bed without using  bedrails?: A Lot Help needed moving to and from a bed to a chair (including a wheelchair)?: A Lot Help needed standing up from a chair using your arms (e.g., wheelchair or bedside chair)?: A Lot Help needed to walk in hospital room?: A Lot Help needed climbing 3-5 steps with a railing? : A Lot 6 Click Score: 12    End of Session Equipment Utilized During Treatment: Gait belt;Oxygen Activity Tolerance: Patient limited by fatigue Patient left: in bed;with call bell/phone within reach;with nursing/sitter in room;with bed alarm set (nursing in room to provide medications) Nurse Communication: Mobility status PT Visit Diagnosis: Other abnormalities of gait and mobility (R26.89);Muscle weakness (generalized) (M62.81)     Time: 8889-8850 PT Time Calculation (min) (ACUTE ONLY): 39 min  Charges:                            Branton Einstein, SPT    Thelton Graca 07/30/2024, 12:56 PM

## 2024-07-30 NOTE — Progress Notes (Signed)
 Occupational Therapy Treatment Patient Details Name: Cassandra [name] MRN: [id number] DOB: 1927/04/09 Today's Date: 07/30/2024   History of present illness Cassandra [name] is a 89 y.o. female with medical history significant for coronary artery disease, end-stage renal disease on hemodialysis on TTS, hypertension, dyslipidemia, hypothyroidism, lumbar stenosis, RLS and type 2 diabetes mellitus, who presented to the emergency room with acute onset of fatigue and generalized weakness with altered mental status and lethargy.  Blood pressure was 79/38 with EMS.  Patient admitted to cough productive of whitish sputum.  No chest pain or palpitations..  She admitted to chest pain yesterday though.  She had low-grade fever of 99.5.  No dysuria, urinary frequency or urgency, hematuria or flank pain.  No diarrhea or melena or bright red bleeding per rectum.  No other bleeding diathesis.   OT comments  Patient seen for OT/PT treatment on this date. Patient benefits from A x 2 for safety due to increased A needed for mobility. Upon arrival to room patient semi fowlers in bed, more conversive and alert than yesterday A&O x 3, spouse present. Patient reports bowel incontinence, OT/PT encouraged patient to come into standing for pericare and bed change, initially patient refusing but was eventually agreeable. Patient transitioned to EOB with mod A; performed sit<>stand from EOB with mod A x 1, began to have more bowel incontinence and was transferred to [facility]; required increased time to complete voiding and required max A x 2 for pericare to ensure stance maintained and to perform hygiene; noted trace amounts of blood during pericare, RN notified and sacral pad applied. While transferring back to bed, she began to have more bowel incontinence and she transferred back to [facility]. O2 increased to 4L. Patient required max A to return to supine in bed, RN present. Patient making good progress toward goals, will continue to follow  POC. Discharge recommendation remains appropriate.        If plan is discharge home, recommend the following:  A lot of help with walking and/or transfers;A lot of help with bathing/dressing/bathroom;Assistance with feeding;Direct supervision/assist for medications management   Equipment Recommendations  None recommended by OT (defer to next venue of care)    Recommendations for Other Services      Precautions / Restrictions Precautions Precautions: Fall Recall of Precautions/Restrictions: Impaired Restrictions Weight Bearing Restrictions Per Provider Order: No       Mobility Bed Mobility Overal bed mobility: Needs Assistance Bed Mobility: Supine to Sit, Sit to Supine     Supine to sit: Max assist, HOB elevated Sit to supine: Max assist, HOB elevated        Transfers Overall transfer level: Needs assistance Equipment used: Rolling walker (2 wheels) Transfers: Bed to chair/wheelchair/BSC Sit to Stand: Max assist Stand pivot transfers: Max assist         General transfer comment: SPT from EOB to BSC with mod-max A x 2     Balance Overall balance assessment: Needs assistance Sitting-balance support: Feet unsupported, Bilateral upper extremity supported Sitting balance-Leahy Scale: Fair Sitting balance - Comments: required steadying A   Standing balance support: Bilateral upper extremity supported Standing balance-Leahy Scale: Poor Standing balance comment: increased postural sway with no UE support                           ADL either performed or assessed with clinical judgement   ADL Overall ADL's : Needs assistance/impaired  Upper Body Dressing : Maximal assistance;Sitting   Lower Body Dressing: Maximal assistance;Sit to/from stand   Toilet Transfer: Moderate assistance;+2 for safety/equipment;Rolling walker (2 wheels);BSC/3in1   Toileting- Clothing Manipulation and Hygiene: Maximal assistance;+2 for  safety/equipment;Sit to/from stand         General ADL Comments: bowel incontinence x 2 during tx, more engaged and more alert but continues to require max A    Extremity/Trunk Assessment              Vision       Perception     Praxis     Communication Communication Communication: Impaired   Cognition Arousal: Alert Behavior During Therapy: WFL for tasks assessed/performed     Orientation impairments: Situation Awareness: Intellectual awareness impaired                         Following commands: Impaired Following commands impaired: Follows one step commands inconsistently      Cueing      Exercises      Shoulder Instructions       General Comments      Pertinent Vitals/ Pain       Pain Assessment Pain Assessment: Faces Faces Pain Scale: Hurts even more Pain Location: buttocks (during pericare) Pain Descriptors / Indicators: Moaning Pain Intervention(s): Other (comment) (notified RN who applied sacral pad)  Home Living                                          Prior Functioning/Environment              Frequency  Min 2X/week        Progress Toward Goals  OT Goals(current goals can now be found in the care plan section)  Progress towards OT goals: Progressing toward goals  Acute Rehab OT Goals Patient Stated Goal: to go home OT Goal Formulation: With patient Time For Goal Achievement: 08/11/24 Potential to Achieve Goals: Fair ADL Goals Pt Will Perform Grooming: with supervision;sitting;standing Pt Will Perform Upper Body Bathing: sitting;with supervision Pt Will Perform Lower Body Dressing: with min assist;sit to/from stand Pt Will Transfer to Toilet: with contact guard assist;ambulating;bedside commode  Plan      Co-evaluation    PT/OT/SLP Co-Evaluation/Treatment: Yes Reason for Co-Treatment: Complexity of the patient's impairments (multi-system involvement);For patient/therapist safety;To  address functional/ADL transfers PT goals addressed during session: Mobility/safety with mobility OT goals addressed during session: ADL's and self-care      AM-PAC OT 6 Clicks Daily Activity     Outcome Measure   Help from another person eating meals?: A Little Help from another person taking care of personal grooming?: A Lot Help from another person toileting, which includes using toliet, bedpan, or urinal?: A Lot Help from another person bathing (including washing, rinsing, drying)?: A Lot Help from another person to put on and taking off regular upper body clothing?: A Lot Help from another person to put on and taking off regular lower body clothing?: A Lot 6 Click Score: 13    End of Session Equipment Utilized During Treatment: Oxygen  OT Visit Diagnosis: Unsteadiness on feet (R26.81);Other abnormalities of gait and mobility (R26.89);Muscle weakness (generalized) (M62.81)   Activity Tolerance Patient limited by lethargy   Patient Left in bed;with nursing/sitter in room   Nurse Communication Other (comment) (concerns for PU on sacrum, trace blood noted during pericare,  sacral pad placed)        Time: 8888-8850 OT Time Calculation (min): 38 min  Charges: OT General Charges $OT Visit: 1 Visit OT Treatments $Self Care/Home Management : 38-52 mins  Rogers Clause, OT/L MSOT, 07/30/2024

## 2024-07-30 NOTE — Consult Note (Signed)
 Consultation Note Date: 07/30/2024 at 1300  Patient Name: Cassandra [name]  DOB: 1927-06-15  MRN: [id number]  Age / Sex: 89 y.o., female  PCP: Cleotilde Oneil FALCON, MD Referring Physician: Awanda City, MD  HPI/Patient Profile: 89 y.o. female  with past medical history of CAD, ESRD (HD - TTS), HTN, dyslipidemia, hypothyroidism, and type 2 diabetes mellitus, admitted on 07/26/2024 with fatigue, generalized weakness, and lethargy.  Upon presentation to HD on 11/1, patient was encephalopathic, more lethargic, and therefore EMS was called.  EMS arrived to outpatient HD clinic and found patient to be hypotensive and hypoxic.  Radiograph chest revealed dense left mid to lower lung airspace disease most consistent with lingular pneumonia with a small likely parapneumonic effusion.  CT of chest and abdomen revealed multifocal consolidation greatest in the left lower lobe and lingula compatible with pneumonia as well as cholelithiasis without acute cholecystitis, bilateral renal atrophy, colonic diverticulosis, aortic atherosclerosis, and coronary artery disease.  Patient is being treated for severe sepsis due to multifocal pneumonia, elevated troponin without complaint of chest pain (likely demand ischemia with sepsis), and lethargy in the setting of pneumonia.  PMT was consulted to support patient and family with goals of care discussions.  Clinical Assessment and Goals of Care: Extensive chart review completed prior to meeting patient including labs, vital signs, imaging, progress notes, orders, and available advanced directive documents from current and previous encounters. I then met with patient at bedside to discuss diagnosis prognosis, GOC, EOL wishes, disposition and options.  RN, SLP therapist, and transport were at bedside during my visit.  I was able to speak with the patient for a few minutes before she was  transferred off the unit to MRI.  I introduced Palliative Medicine as specialized medical care for people living with serious illness. It focuses on providing relief from the symptoms and stress of a serious illness. The goal is to improve quality of life for both the patient and the family.  We discussed a brief life review of the patient.  Patient is ready from [facility], [place] .  She has been married for 61 years.  Though I attempted to continue a life review, patient had complaints of tenderness and soreness of her gumlines.  Will Be assessed.  No open lesions noted.  Patient does not have upper dentures in.  She has partial lower dentures in place.  She shares these are causing pain around her gumline.  When asked what could help improve this issue, she shares that flossing with a soft food is helpful.  Advised patient that we do not have soft foods Toothette's at the hospital.  She shares that her husband has been provide can bring in from home.  Additionally, I asked the patient would prefer that her upper dentures be in place.  She shares these are also at home.  She is requesting both Toothette Fossier's and dentures from her husband.  Discussed improved oral care could help minimize pain and discomfort.  However, she endorses this pain has been  ongoing for the last 5 to 6 months.  Advised that we will attempt to improve that but that it appears to be needing dental follow-up/workup.  She endorsed understanding and appreciation.  I attempted to gauge patient's understanding of her current medical situation.  She shares she knows she has multifocal pneumonia.  She shares that she was not quite myself earlier.  RN endorses patient was much more confused and only able to answer basic questions like what is her name and where she this morning.  He shares that she has had a drastic improvement just since this morning.    We discussed patient's current illness and what it means in the larger  context of patient's on-going co-morbidities.   Symptoms assessed. Patient endorses she is feeling better.  When asked to elaborate on what feeling better means to her, patient shares she feels like the mucus in her chest is breaking up and she is able to cough more of it up.  Education provided on mucolytic therapy as well as nebulizer treatments providing support for pulmonary toileting.  I was unable to complete GOC discussion today as patient needed to leave for MRI. She was agreeable for me to return tomorrow to continue GOC disucssions. She also was in agreement for me to speak with her husband.   After visiting with the patient, I spoke with her husband Elsie over the phone.  Medical update given.  Reviewed checks x-ray is revealing slightly worsening pneumonia.  Myles Elsie that MRI without contrast of Francis's brain is still pending.  He shares understanding that pneumonia is not resolved.  Education provided that pneumonia often takes patients longer amounts of time to recover from a pulmonary and functional status.  He experiences great appreciation for my phone call and update.  Also advised Elsie that his wife is requesting her upper dentures and saucerize from home.  He shares he will bring these to the hospital during his next visit.  No change to plan of care at this time.  PMT will continue to follow and support.  Primary Decision Maker PATIENT  Physical Exam Constitutional:      General: She is not in acute distress.    Appearance: She is normal weight.  HENT:     Nose: Nose normal.     Mouth/Throat:     Mouth: Mucous membranes are moist.     Comments: Partial lower dentures in place  Eyes:     Pupils: Pupils are equal, round, and reactive to light.  Pulmonary:     Breath sounds: No wheezing.  Abdominal:     Palpations: Abdomen is soft.  Musculoskeletal:     Comments: Generalized weakness MAETC  Skin:    General: Skin is warm and dry.      Coloration: Skin is not pale.  Neurological:     Mental Status: She is alert and oriented to person, place, and time.  Psychiatric:        Mood and Affect: Mood normal.        Behavior: Behavior normal.        Thought Content: Thought content normal.        Judgment: Judgment normal.     Palliative Assessment/Data: 70%     Thank you for this consult. Palliative medicine will continue to follow and assist holistically.   75 minute visit includes: Detailed review of medical records (labs, imaging, vital signs), medically appropriate exam (mental status, respiratory, cardiac, skin), discussed with treatment team, counseling and  educating patient, family and staff, documenting clinical information, medication management and coordination of care.  Signed by: Lamarr Gunner, DNP, FNP-BC Palliative Medicine   Please contact Palliative Medicine Team providers via [facility] for questions and concerns.

## 2024-07-30 NOTE — Progress Notes (Signed)
  PROGRESS NOTE    ALBANY WINSLOW  FMW:[id number] DOB: Feb 03, 1927 DOA: 07/26/2024 PCP: Cleotilde Oneil FALCON, MD  [id number]  LOS: 4 days   Brief hospital course:   Assessment & Plan: Cassandra [name] is a 89 y.o. female with medical history significant for coronary artery disease, end-stage renal disease on hemodialysis on TTS, hypertension, dyslipidemia, hypothyroidism, lumbar stenosis, RLS and type 2 diabetes mellitus, who presented to the emergency room with acute onset of fatigue and generalized weakness with altered mental status and lethargy.  Blood pressure was 79/38 with EMS.  Patient admitted to cough productive of whitish sputum   Severe sepsis due to multifocal pneumonia [facility]) -- Patient came in with altered mental status, respiratory distress, elevated white count, elevated lactic acid and multifocal pneumonia.  large consolidation in the left lowe lobe and lingula.  -- Pulmonary consultation--Dr Assaker--change to zosyn,vanc and azithromycin .  Vanc since d/c'ed. --s/p IV hydrocortisone --stress dose --cont zosyn and azithro --cont chest PT  Bilateral pleural effusions --US  thoracentesis   Elevated troponin 2/2 demand ischemia  --No chest pain.  IV heparin  for 48 hours per dr Fernand, completed.  Cardiology consulted, no plan for further cardiac diagnostics.  CAD --cont ASA, plavix and statin   Depression - Continue Zoloft.   Dyslipidemia -continue statin therapy.   Hypothyroidism - continue Synthroid .   Lethargy in the setting of PNA --improved after holding sedating meds    Hyperkalemia --correct with dialysis   DVT prophylaxis: Heparin  SQ Code Status: DNR  Family Communication: husband updated at bedside today Level of care: Progressive Dispo:   The patient is from: home Anticipated d/c is to: home.  Pt declined SNF rehab Anticipated d/c date is: 1-2 days   Subjective and Interval History:  Pt reported I am good.  No  complaint.   Objective: Vitals:   07/30/24 1157 07/30/24 1459 07/30/24 2022 07/30/24 2031  BP: (!) 159/86 (!) 155/81  (!) 97/58  Pulse: 90 90  84  Resp: 20   18  Temp: 98.5 F (36.9 C) 98.1 F (36.7 C)  97.9 F (36.6 C)  TempSrc: Oral Oral    SpO2: 100% 100% 100% 97%  Weight:      Height:        Intake/Output Summary (Last 24 hours) at 07/30/2024 2058 Last data filed at 07/30/2024 1420 Gross per 24 hour  Intake 590 ml  Output --  Net 590 ml   Filed Weights   07/29/24 0501 07/29/24 0806 07/29/24 1140  Weight: 58.4 kg 58.4 kg 57.4 kg    Examination:   Constitutional: NAD, AAOx3 HEENT: conjunctivae and lids normal, EOMI CV: No cyanosis.   RESP: normal respiratory effort, on RA Neuro: II - XII grossly intact.   Psych: Normal mood and affect.  Appropriate judgement and reason   Data Reviewed: I have personally reviewed labs and imaging studies  Time spent: 50 minutes  Ellouise Haber, MD [facility] If 7PM-7AM, please contact night-coverage 07/30/2024, 8:58 PM

## 2024-07-30 NOTE — Progress Notes (Signed)
 NAME:  Cassandra [name], MRN:  [id number], DOB:  05-22-1927, LOS: 4 ADMISSION DATE:  07/26/2024, CONSULTATION DATE:  07/27/2024 REFERRING MD:  Leita Blanch MD, CHIEF COMPLAINT:  CAP   History of Present Illness:  Cassandra [name] is a 89 year old female patient with a past medical history of coronary artery disease, end-stage renal disease on HD Tuesday Thursday Saturday, hypertension, dyslipidemia, hypothyroidism, type 2 diabetes mellitus who presented to the emergency department on 07/26/2024 with fatigue and generalized weakness.  She was found to be encephalopathic.  Apparently last known well the morning of her presentation then underwent HD and after HD she was noted to be more lethargic.  EMS were called and and found her to be hypotensive and therefore she was brought in to the emergency department.  She was found to be slightly hypotensive and responded to fluids.  She was also found to be hypoxic and was placed on 10 L nasal cannula.  Labs were notable for an elevated white count of 34.4 with neutrophilic predominance at 85%.  Platelets were elevated at 1000.  Lactic acid of 3.4.  Electrolytes were within normal range.  Creatinine was 5.46 mg/dL but she is a hemodialysis patient.  CT chest on 11/1 with multifocal large consolidation greatest in the left lower lobe and lingula.  She was admitted and started on ceftriaxone  and azithromycin .  Pulmonary team is being consulted for help with further management.   07/30/24- patient has improved quite a bit, she's only on 2-3L/min.  After stopping her gabapentin , requip , solucortef, benadryl  she is awake and interactive she was able to tell me correct year & month but was unsure if she's is at [facility] in [place] vs [facility] which is imprressive. Her CXR shows moderate left effusion I have placed IR consult for thoracentesis.  She is close to baseline.     IMAGING   Narrative & Impression  EXAM: CT CHEST, ABDOMEN AND PELVIS WITHOUT  CONTRAST 03/05/2023 09:23:51 PM   TECHNIQUE: CT of the chest, abdomen and pelvis was performed without the administration of intravenous contrast. Multiplanar reformatted images are provided for review. Automated exposure control, iterative reconstruction, and/or weight based adjustment of the mA/kV was utilized to reduce the radiation dose to as low as reasonably achievable.   COMPARISON: None available.   CLINICAL HISTORY: Sepsis.   FINDINGS:   CHEST:   MEDIASTINUM AND LYMPH NODES: Mild cardiomegaly. Diffuse coronary artery and aortic atherosclerosis. The central airways are clear. No mediastinal, hilar or axillary lymphadenopathy.   LUNGS AND PLEURA: Consolidation in the left lower lobe and lingula and to a lesser extent right lower lobe compatible with pneumonia. No pleural effusion or pneumothorax.   ABDOMEN AND PELVIS:   LIVER: The liver is unremarkable.   GALLBLADDER AND BILE DUCTS: Small layering gallstones within the gallbladder. No evidence of acute cholecystitis. No biliary ductal dilatation.   SPLEEN: No acute abnormality.   PANCREAS: No acute abnormality.   ADRENAL GLANDS: No acute abnormality.   KIDNEYS, URETERS AND BLADDER: Atrophic kidneys bilaterally. Left renal mid pole cyst measures 3.5 cm. Per consensus, no follow-up is needed for simple Bosniak type 1 and 2 renal cysts, unless the patient has a malignancy history or risk factors. The urinary bladder is decompressed. No stones in the kidneys or ureters. No hydronephrosis. No perinephric or periureteral stranding.   GI AND BOWEL: Stomach demonstrates no acute abnormality. Postoperative changes in the rectosigmoid colon. Scattered colonic diverticulosis, most pronounced in the left colon. There is no bowel  obstruction.   REPRODUCTIVE ORGANS: Prior hysterectomy.   PERITONEUM AND RETROPERITONEUM: No ascites. No free air.   VASCULATURE: Aorta is normal in caliber.   ABDOMINAL AND  PELVIS LYMPH NODES: No lymphadenopathy.   BONES AND SOFT TISSUES: Diffuse degenerative disc and facet disease throughout the thoracolumbar spine. No focal soft tissue abnormality.   IMPRESSION: 1. Multifocal consolidation greatest in the left lower lobe and lingula, compatible with pneumonia. 2. Cholelithiasis without acute cholecystitis. 3. Bilateral renal atrophy. 4. Colonic diverticulosis. 5. Aortic atherosclerosis. Coronary artery disease.   Electronically signed by: Franky Crease MD 07/26/2024 09:37 PM EDT RP Workstation: [id number]   Objective    Blood pressure (!) 160/82, pulse 91, temperature 99.7 F (37.6 C), temperature source Axillary, resp. rate 20, height 5' 1 (1.549 m), weight 57.4 kg, SpO2 96%.        Intake/Output Summary (Last 24 hours) at 07/30/2024 0821 Last data filed at 07/30/2024 0010 Gross per 24 hour  Intake 625 ml  Output 1000 ml  Net -375 ml   Filed Weights   07/29/24 0501 07/29/24 0806 07/29/24 1140  Weight: 58.4 kg 58.4 kg 57.4 kg    Examination: General: NAD.  HENT: Supple neck, reactive pupils Lungs: Diminished air entry over the left hemithorax.  Cardiovascular: Normal S1, Normal S2, RRR Abdomen: Soft, non tender, non distended  Extremities: Warm, no edema.   Labs and imaging were reviewed.  Assessment and Plan  Cassandra [name] is a 89 year old female patient with a past medical history of coronary artery disease, end-stage renal disease on HD Tuesday Thursday Saturday, hypertension, dyslipidemia, hypothyroidism, type 2 diabetes mellitus who presented to the emergency department on 07/26/2024 with fatigue and generalized weakness.  She was found to have a large left lower and lingular consolidation.  # Sepsis -PRESENT ON ADMISSION - DUE TO PNEUMONIA #Bilateral pleural effusions - IR US  thoracentesis ordered  # Multifocal pneumonia with large consolidation in the left lowe lobe and lingula.  # ESRD on HD TTS  Plan [] -DC SOLUCORTEF, DC  BENADRYL , DC GABAPENTIN , DC REQUIP  CONTINUE ZITHROMAX  AND ZOSYN [] CRP TREND TO CONFIRM NON FLUID RELATED CHANGES []  Titrate for SpO2 > 90%.  -START CHEST PT WITH INCENTIVE SPIROMETRY FLUTTER VALVE AND PT/OT WHEN ABLE I personally spent a total of 31 minutes in the care of the patient today including preparing to see the patient, getting/reviewing separately obtained history, performing a medically appropriate exam/evaluation, counseling and educating, placing orders, documenting clinical information in the EHR, independently interpreting results, and communicating results.   Halina Picking, MD 07/30/2024 8:21 AM

## 2024-07-30 NOTE — Progress Notes (Signed)
 [facility]  ROUNDING NOTE   Subjective:   Cassandra [name] is a 89 year old female with past medical conditions including anxiety, hypertension, RLS, neuropathy, and end-stage renal disease on hemodialysis.  Patient presents to the emergency department with weakness and confusion and has been admitted for ESRD (end stage renal disease) on dialysis (HCC) [N18.6, Z99.2] Sepsis due to pneumonia (HCC) [J18.9, A41.9] Pneumonia due to infectious organism, unspecified laterality, unspecified part of lung [J18.9] Sepsis, due to unspecified organism, unspecified whether acute organ dysfunction present [facility]) [A41.9]  Patient is known to our practice and receives dialysis at [facility] on a TTS, supervised by Dr Marcelino.    Update  Patient sitting up in bed More alert today, word finding Oriented to self and place  Objective:  Vital signs in last 24 hours:  Temp:  [98.5 F (36.9 C)-99.7 F (37.6 C)] 98.5 F (36.9 C) (11/05 1157) Pulse Rate:  [86-94] 90 (11/05 1157) Resp:  [18-20] 20 (11/05 1157) BP: (135-168)/(68-86) 159/86 (11/05 1157) SpO2:  [91 %-100 %] 100 % (11/05 1157)  Weight change: 0 kg Filed Weights   07/29/24 0501 07/29/24 0806 07/29/24 1140  Weight: 58.4 kg 58.4 kg 57.4 kg    Intake/Output: I/O last 3 completed shifts: In: 977.5 [P.O.:240; I.V.:137.4; IV Piggyback:600] Out: 1000 [Other:1000]   Intake/Output this shift:  Total I/O In: 240 [P.O.:240] Out: -   Physical Exam: General: NAD  Head: Normocephalic, atraumatic. Moist oral mucosal membranes  Eyes: Anicteric  Lungs:  Diminished, [place] O2  Heart: Regular rate and rhythm  Abdomen:  Soft, nontender  Extremities: No peripheral edema.  Neurologic: Awake and alert  Skin: Warm,dry, no rash  Access: Left upper aVF    Basic Metabolic Panel: Recent Labs  Lab 07/26/24 2015 07/27/24 0330 07/29/24 0413  NA 135 136 134*  K 4.8 4.6 5.6*  CL 91* 97* 98  CO2 26 22 20*  GLUCOSE 71 65* 115*   BUN 20 28* 67*  CREATININE 5.46* 5.60* 7.43*  CALCIUM  8.1* 7.3* 7.9*    Liver Function Tests: Recent Labs  Lab 07/26/24 2015  AST 40  ALT 18  ALKPHOS 61  BILITOT 1.1  PROT 7.5  ALBUMIN  3.0*   No results for input(s): LIPASE, AMYLASE in the last 168 hours. No results for input(s): AMMONIA in the last 168 hours.  CBC: Recent Labs  Lab 07/26/24 2015 07/26/24 2330 07/27/24 0330 07/28/24 0052 07/29/24 0413  WBC 34.4* 37.3* 30.3* 29.9* 30.9*  NEUTROABS 28.6* 30.4*  --   --   --   HGB 16.2* 13.9 14.1 13.7 13.8  HCT 52.2* 45.1 45.3 42.4 42.6  MCV 101.2* 102.3* 102.7* 99.5 97.9  PLT 1,075* 1,090* 971* 982* 973*    Cardiac Enzymes: No results for input(s): CKTOTAL, CKMB, CKMBINDEX, TROPONINI in the last 168 hours.  BNP: Invalid input(s): POCBNP  CBG: No results for input(s): GLUCAP in the last 168 hours.  Microbiology: Results for orders placed or performed during the hospital encounter of 07/26/24  Blood culture (routine x 2)     Status: None (Preliminary result)   Collection Time: 07/26/24  8:11 PM   Specimen: BLOOD  Result Value Ref Range Status   Specimen Description BLOOD RIGHT ANTECUBITAL  Final   Special Requests   Final    BOTTLES DRAWN AEROBIC AND ANAEROBIC Blood Culture adequate volume   Culture   Final    NO GROWTH 4 DAYS Performed at [facility], [address], [place], [place] [address]  Report Status PENDING  Incomplete  Resp panel by RT-PCR (RSV, Flu A&B, Covid) Anterior Nasal Swab     Status: None   Collection Time: 07/26/24  8:15 PM   Specimen: Anterior Nasal Swab  Result Value Ref Range Status   SARS Coronavirus 2 by RT PCR NEGATIVE NEGATIVE Final    Comment: (NOTE) SARS-CoV-2 target nucleic acids are NOT DETECTED.  The SARS-CoV-2 RNA is generally detectable in upper respiratory specimens during the acute phase of infection. The lowest concentration of SARS-CoV-2 viral copies this assay can detect is 138  copies/mL. A negative result does not preclude SARS-Cov-2 infection and should not be used as the sole basis for treatment or other patient management decisions. A negative result may occur with  improper specimen collection/handling, submission of specimen other than nasopharyngeal swab, presence of viral mutation(s) within the areas targeted by this assay, and inadequate number of viral copies(<138 copies/mL). A negative result must be combined with clinical observations, patient history, and epidemiological information. The expected result is Negative.  Fact Sheet for Patients:  bloggercourse.com  Fact Sheet for Healthcare Providers:  seriousbroker.it  This test is no t yet approved or cleared by the [place]  FDA and  has been authorized for detection and/or diagnosis of SARS-CoV-2 by FDA under an Emergency Use Authorization (EUA). This EUA will remain  in effect (meaning this test can be used) for the duration of the COVID-19 declaration under Section 564(b)(1) of the Act, 21 U.S.C.section 360bbb-3(b)(1), unless the authorization is terminated  or revoked sooner.       Influenza A by PCR NEGATIVE NEGATIVE Final   Influenza B by PCR NEGATIVE NEGATIVE Final    Comment: (NOTE) The Xpert Xpress SARS-CoV-2/FLU/RSV plus assay is intended as an aid in the diagnosis of influenza from Nasopharyngeal swab specimens and should not be used as a sole basis for treatment. Nasal washings and aspirates are unacceptable for Xpert Xpress SARS-CoV-2/FLU/RSV testing.  Fact Sheet for Patients: bloggercourse.com  Fact Sheet for Healthcare Providers: seriousbroker.it  This test is not yet approved or cleared by the [place]  FDA and has been authorized for detection and/or diagnosis of SARS-CoV-2 by FDA under an Emergency Use Authorization (EUA). This EUA will remain in effect (meaning  this test can be used) for the duration of the COVID-19 declaration under Section 564(b)(1) of the Act, 21 U.S.C. section 360bbb-3(b)(1), unless the authorization is terminated or revoked.     Resp Syncytial Virus by PCR NEGATIVE NEGATIVE Final    Comment: (NOTE) Fact Sheet for Patients: bloggercourse.com  Fact Sheet for Healthcare Providers: seriousbroker.it  This test is not yet approved or cleared by the [place]  FDA and has been authorized for detection and/or diagnosis of SARS-CoV-2 by FDA under an Emergency Use Authorization (EUA). This EUA will remain in effect (meaning this test can be used) for the duration of the COVID-19 declaration under Section 564(b)(1) of the Act, 21 U.S.C. section 360bbb-3(b)(1), unless the authorization is terminated or revoked.  Performed at [facility], [address] [place], [place] [address]   Blood culture (routine x 2)     Status: None (Preliminary result)   Collection Time: 07/26/24  8:16 PM   Specimen: BLOOD  Result Value Ref Range Status   Specimen Description BLOOD RIGHT ANTECUBITAL  Final   Special Requests   Final    BOTTLES DRAWN AEROBIC AND ANAEROBIC Blood Culture results may not be optimal due to an inadequate volume of blood received in culture bottles  Culture   Final    NO GROWTH 4 DAYS Performed at [facility], [address] [place], [place] [address]    Report Status PENDING  Incomplete  MRSA Next Gen by PCR, Nasal     Status: None   Collection Time: 07/27/24  5:00 PM   Specimen: Nasal Mucosa; Nasal Swab  Result Value Ref Range Status   MRSA by PCR Next Gen NOT DETECTED NOT DETECTED Final    Comment: (NOTE) The GeneXpert MRSA Assay (FDA approved for NASAL specimens only), is one component of a comprehensive MRSA colonization surveillance program. It is not intended to diagnose MRSA infection nor to guide or monitor treatment for MRSA  infections. Test performance is not FDA approved in patients less than 84 years old. Performed at [facility], [address] [place], [place] [address]     Coagulation Studies: No results for input(s): LABPROT, INR in the last 72 hours.   Urinalysis: No results for input(s): COLORURINE, LABSPEC, PHURINE, GLUCOSEU, HGBUR, BILIRUBINUR, KETONESUR, PROTEINUR, UROBILINOGEN, NITRITE, LEUKOCYTESUR in the last 72 hours.  Invalid input(s): APPERANCEUR    Imaging: DG Chest Port 1 View Result Date: 07/30/2024 EXAM: 1 VIEW(S) XRAY OF THE CHEST 07/30/2024 12:53:00 AM COMPARISON: 07/26/2024. CLINICAL HISTORY: Pneumonia. FINDINGS: LUNGS AND PLEURA: Diffuse peripheral predominant interstitial reticulation is favored to represent sequelae of chronic lung disease possibly early pulmonary fibrosis. Superimposed mild edema not excluded. Progressive airspace consolidation within the left mid and left lower lung concerning for worsening pneumonia. Moderate left pleural effusion. No pneumothorax. HEART AND MEDIASTINUM: Cardiomegaly. Calcified aorta. BONES AND SOFT TISSUES: Old right rib fractures. IMPRESSION: 1. Progressive left mid and lower lung consolidation concerning for worsening pneumonia. 2. Moderate left pleural effusion. Electronically signed by: Waddell Calk MD 07/30/2024 06:29 AM EST RP Workstation: [id number]     Medications:    azithromycin  Stopped (07/29/24 2339)   piperacillin-tazobactam (ZOSYN)  IV 2.25 g (07/30/24 0528)    aspirin  EC  81 mg Oral Daily   calcium  acetate  1,334 mg Oral TID WC   Chlorhexidine  Gluconate Cloth  6 each Topical Q0600   clopidogrel  75 mg Oral Daily   feeding supplement (NEPRO CARB STEADY)  237 mL Oral BID BM   guaiFENesin  600 mg Oral BID   heparin  injection (subcutaneous)  5,000 Units Subcutaneous Q8H   ipratropium-albuterol   3 mL Nebulization TID   levothyroxine   88 mcg Oral Q0600   rosuvastatin   10 mg Oral Daily    sertraline  50 mg Oral Daily   acetaminophen  **OR** acetaminophen , hydrALAZINE , lidocaine , magnesium hydroxide, ondansetron  **OR** ondansetron  (ZOFRAN ) IV  Assessment/ Plan:  Cassandra [name] is a 89 y.o.  female with past medical conditions including anxiety, hypertension, RLS, neuropathy, and end-stage renal disease on hemodialysis.  Patient presents to the emergency department with weakness and confusion and has been admitted for ESRD (end stage renal disease) on dialysis (HCC) [N18.6, Z99.2] Sepsis due to pneumonia (HCC) [J18.9, A41.9] Pneumonia due to infectious organism, unspecified laterality, unspecified part of lung [J18.9] Sepsis, due to unspecified organism, unspecified whether acute organ dysfunction present (HCC) [A41.9]  [facility]/TTS/left aVF   End-stage renal disease on hemodialysis. Next treatment scheduled for Thursday.   2. Anemia of chronic kidney disease Lab Results  Component Value Date   HGB 13.8 07/29/2024    Hemoglobin at goal.   3. Secondary Hyperparathyroidism: with outpatient labs: None available Lab Results  Component Value Date   CALCIUM  7.9 (L) 07/29/2024  CAION 1.11 (L) 05/25/2023   PHOS 7.0 (H) 07/17/2024   Continue calcium  acetate with meals.  4. Hypertension with chronic kidney disease. Home regimen includes amlodipine  and clonidine . Blood pressure 159/86    LOS: 4 Kunaal Walkins 11/5/202512:18 PM

## 2024-07-30 NOTE — Progress Notes (Signed)
 Patient anxious and agitated banging TV control and phone on bedside table and the side of the bed. Ativan  PRN given, upon recheck ativan  was somewhat effective. Patient is no longer banging on the table but continues to be anxious.

## 2024-07-30 NOTE — Plan of Care (Signed)

## 2024-07-30 NOTE — NC FL2 (Signed)
 [place]  MEDICAID FL2 LEVEL OF CARE FORM     IDENTIFICATION  Patient Name: Cassandra [name] Birthdate: Jan 09, 1927 Sex: female Admission Date (Current Location): 07/26/2024  [facility] and [place] Number:  Chiropodist and Address:  [facility], [address] [place], [place] [address]      Provider Number: [id number]  Attending Physician Name and Address:  Awanda City, MD  Relative Name and Phone Number:       Current Level of Care: Hospital Recommended Level of Care: Skilled Nursing Facility Prior Approval Number:    Date Approved/Denied:   PASRR Number: [id number]  Discharge Plan: SNF    Current Diagnoses: Patient Active Problem List   Diagnosis Date Noted   NSTEMI (non-ST elevated myocardial infarction) (HCC) 07/27/2024   Essential hypertension 07/27/2024   Hypothyroidism 07/27/2024   Dyslipidemia 07/27/2024   Depression 07/27/2024   Sepsis due to pneumonia (HCC) 07/26/2024   Stroke (HCC) 07/16/2024   ESRD on dialysis (HCC) 07/16/2024   Mechanical complication of AV shunt 06/01/2024   Acute hypoxemic respiratory failure (HCC) 04/08/2024   Complication of AV dialysis fistula, initial encounter 08/06/2023   Renal failure 03/06/2023   Acute hypoxic respiratory failure (HCC) 03/05/2023   Pneumonia 03/05/2023   ESRD (end stage renal disease) on dialysis (HCC) 03/05/2023   Hyperkalemia 03/05/2023   Overweight (BMI 25.0-29.9) 12/14/2021   Acute renal failure superimposed on stage 4 chronic kidney disease (HCC) 12/14/2021   Hematochezia, recurrent 12/11/2021   Recent cerebrovascular accident 10/12/2021 (CVA) 12/11/2021   Hypotension 12/11/2021   GI bleed 12/11/2021   Acute blood loss anemia 12/11/2021   Cholelithiasis 12/11/2021   Acute CVA (cerebrovascular accident) (HCC) 10/13/2021   Leukocytosis 10/13/2021   Rectal bleeding 10/03/2021   Bright red rectal bleeding 10/02/2021   GIB (gastrointestinal bleeding) 10/02/2021    Hypoxemia 05/15/2021   Hypoxia 05/14/2021   Hypertensive urgency 05/14/2021   Tibial plateau fracture 04/21/21, left, closed, with routine healing, subsequent encounter 05/14/2021   CKD (chronic kidney disease) stage 4, GFR 15-29 ml/min (HCC) 04/07/2021   Acquired hypothyroidism 12/02/2020   Hyperlipidemia, mixed 12/02/2020   Aortic atherosclerosis 10/04/2020   Polycythemia vera (HCC) 06/02/2020   Thrombocytosis 02/18/2020   Vitamin D  deficiency 03/25/2018   Hypertension 01/24/2015   Diabetes mellitus, type II (HCC) 01/24/2015   Restless legs syndrome 01/24/2015   Chronic constipation 01/24/2015   Lumbar stenosis with neurogenic claudication 05/04/2014   Peripheral neuropathy 03/09/2014   CAD (coronary artery disease), native coronary artery 01/07/2014    Orientation RESPIRATION BLADDER Height & Weight     Self  O2 (3.5 [place] LPM) Continent Weight: 57.4 kg Height:  5' 1 (154.9 cm)  BEHAVIORAL SYMPTOMS/MOOD NEUROLOGICAL BOWEL NUTRITION STATUS   (none)  (hx Stroke in 01/23) Incontinent Diet (DYS 1)  AMBULATORY STATUS COMMUNICATION OF NEEDS Skin   Extensive Assist Verbally Bruising, Other (Comment) (Right buttocks, foam dressing)                       Personal Care Assistance Level of Assistance  Bathing, Feeding, Dressing Bathing Assistance: Maximum assistance Feeding assistance: Limited assistance Dressing Assistance: Maximum assistance     Functional Limitations Info  Sight, Hearing, Speech Sight Info: Adequate Hearing Info: Adequate Speech Info: Adequate    SPECIAL CARE FACTORS FREQUENCY  PT (By licensed PT), OT (By licensed OT)     PT Frequency: 5 x week OT Frequency: 5 x week  Contractures Contractures Info: Not present    Additional Factors Info  Code Status, Allergies (Limited DNR) Code Status Info: DNR Allergies Info: Atorvastatin, Cyclobenzaprine, Mirtazapine, Oxycodone-acetaminophen , Paroxetine Hcl, Penicillins, Propoxyphene, Trazodone             Current Medications (07/30/2024):  This is the current hospital active medication list Current Facility-Administered Medications  Medication Dose Route Frequency Provider Last Rate Last Admin   acetaminophen  (TYLENOL ) tablet 650 mg  650 mg Oral Q6H PRN Mansy, Jan A, MD       Or   acetaminophen  (TYLENOL ) suppository 650 mg  650 mg Rectal Q6H PRN Mansy, Madison LABOR, MD       aspirin  EC tablet 81 mg  81 mg Oral Daily Mansy, Jan A, MD   81 mg at 07/30/24 0959   azithromycin  (ZITHROMAX ) 500 mg in sodium chloride  0.9 % 250 mL IVPB  500 mg Intravenous Q24H Mansy, Jan A, MD   Stopped at 07/29/24 2339   calcium  acetate (PHOSLO ) capsule 1,334 mg  1,334 mg Oral TID WC Mansy, Jan A, MD   1,334 mg at 07/30/24 1149   Chlorhexidine  Gluconate Cloth 2 % PADS 6 each  6 each Topical Q0600 Druscilla Bald, NP   6 each at 07/30/24 0527   clopidogrel (PLAVIX) tablet 75 mg  75 mg Oral Daily Mansy, Jan A, MD   75 mg at 07/30/24 0959   feeding supplement (NEPRO CARB STEADY) liquid 237 mL  237 mL Oral BID BM Mansy, Jan A, MD   237 mL at 07/30/24 0959   guaiFENesin (MUCINEX) 12 hr tablet 600 mg  600 mg Oral BID Mansy, Jan A, MD   600 mg at 07/30/24 0959   heparin  injection 5,000 Units  5,000 Units Subcutaneous Q8H Patel, Sona, MD   5,000 Units at 07/30/24 0527   hydrALAZINE  (APRESOLINE ) injection 10 mg  10 mg Intravenous Q6H PRN Patel, Sona, MD   10 mg at 07/28/24 1223   ipratropium-albuterol  (DUONEB) 0.5-2.5 (3) MG/3ML nebulizer solution 3 mL  3 mL Nebulization TID Patel, Sona, MD   3 mL at 07/30/24 0730   levothyroxine  (SYNTHROID ) tablet 88 mcg  88 mcg Oral Q0600 Mansy, Jan A, MD   88 mcg at 07/30/24 0527   lidocaine  (XYLOCAINE ) 2 % viscous mouth solution 15 mL  15 mL Mouth/Throat Q4H PRN Mansy, Jan A, MD       LORazepam  (ATIVAN ) injection 1 mg  1 mg Intravenous Once PRN Awanda City, MD       magnesium hydroxide (MILK OF MAGNESIA) suspension 30 mL  30 mL Oral Daily PRN Mansy, Jan A, MD       ondansetron  (ZOFRAN )  tablet 4 mg  4 mg Oral Q6H PRN Mansy, Jan A, MD       Or   ondansetron  (ZOFRAN ) injection 4 mg  4 mg Intravenous Q6H PRN Mansy, Jan A, MD       piperacillin-tazobactam (ZOSYN) IVPB 2.25 g  2.25 g Intravenous Q8H Nazari, Walid A, RPH 100 mL/hr at 07/30/24 0528 2.25 g at 07/30/24 0528   rosuvastatin  (CRESTOR ) tablet 10 mg  10 mg Oral Daily Mansy, Jan A, MD   10 mg at 07/30/24 0959   sertraline (ZOLOFT) tablet 50 mg  50 mg Oral Daily Mansy, Jan A, MD   50 mg at 07/30/24 9040     Discharge Medications: Please see discharge summary for a list of discharge medications.  Relevant Imaging Results:  Relevant Lab Results:   Additional Information SS#: [id number].  HD at [facility] TTS 6:30 AM  Shasta DELENA Daring, RN

## 2024-07-31 ENCOUNTER — Inpatient Hospital Stay

## 2024-07-31 DIAGNOSIS — J189 Pneumonia, unspecified organism: Secondary | ICD-10-CM | POA: Diagnosis not present

## 2024-07-31 DIAGNOSIS — Z515 Encounter for palliative care: Secondary | ICD-10-CM | POA: Diagnosis not present

## 2024-07-31 DIAGNOSIS — N186 End stage renal disease: Secondary | ICD-10-CM | POA: Diagnosis not present

## 2024-07-31 DIAGNOSIS — A419 Sepsis, unspecified organism: Secondary | ICD-10-CM | POA: Diagnosis not present

## 2024-07-31 LAB — CBC
HCT: 38.8 % (ref 36.0–46.0)
Hemoglobin: 12.3 g/dL (ref 12.0–15.0)
MCH: 31.1 pg (ref 26.0–34.0)
MCHC: 31.7 g/dL (ref 30.0–36.0)
MCV: 98 fL (ref 80.0–100.0)
Platelets: 675 K/uL — ABNORMAL HIGH (ref 150–400)
RBC: 3.96 MIL/uL (ref 3.87–5.11)
RDW: 15.9 % — ABNORMAL HIGH (ref 11.5–15.5)
WBC: 17.8 K/uL — ABNORMAL HIGH (ref 4.0–10.5)
nRBC: 0 % (ref 0.0–0.2)

## 2024-07-31 LAB — CULTURE, BLOOD (ROUTINE X 2)
Culture: NO GROWTH
Culture: NO GROWTH
Special Requests: ADEQUATE

## 2024-07-31 LAB — BASIC METABOLIC PANEL WITH GFR
Anion gap: 13 (ref 5–15)
BUN: 67 mg/dL — ABNORMAL HIGH (ref 8–23)
CO2: 24 mmol/L (ref 22–32)
Calcium: 7.9 mg/dL — ABNORMAL LOW (ref 8.9–10.3)
Chloride: 101 mmol/L (ref 98–111)
Creatinine, Ser: 6.04 mg/dL — ABNORMAL HIGH (ref 0.44–1.00)
GFR, Estimated: 6 mL/min — ABNORMAL LOW (ref >60)
Glucose, Bld: 89 mg/dL (ref 70–99)
Potassium: 4.4 mmol/L (ref 3.5–5.1)
Sodium: 138 mmol/L (ref 135–145)

## 2024-07-31 LAB — GLUCOSE, CAPILLARY: Glucose-Capillary: 76 mg/dL (ref 70–99)

## 2024-07-31 LAB — MAGNESIUM: Magnesium: 2.4 mg/dL (ref 1.7–2.4)

## 2024-07-31 LAB — PHOSPHORUS: Phosphorus: 3.3 mg/dL (ref 2.5–4.6)

## 2024-07-31 MED ORDER — MELATONIN 5 MG PO TABS
5.0000 mg | ORAL_TABLET | Freq: Every evening | ORAL | Status: DC | PRN
  Administered 2024-08-01 (×2): 5 mg via ORAL
  Filled 2024-07-31 (×2): qty 1

## 2024-07-31 MED ORDER — STROKE: EARLY STAGES OF RECOVERY BOOK: Freq: Once | Status: DC

## 2024-07-31 MED ORDER — HEPARIN SODIUM (PORCINE) 1000 UNIT/ML DIALYSIS: 1000.0000 [IU] | INTRAMUSCULAR | Status: DC | PRN

## 2024-07-31 MED ORDER — ROPINIROLE HCL 0.25 MG PO TABS
0.5000 mg | ORAL_TABLET | Freq: Two times a day (BID) | ORAL | Status: DC
  Administered 2024-07-31 – 2024-08-06 (×12): 0.5 mg via ORAL
  Filled 2024-07-31 (×3): qty 2
  Filled 2024-07-31 (×2): qty 1
  Filled 2024-07-31 (×3): qty 2
  Filled 2024-07-31: qty 1
  Filled 2024-07-31 (×4): qty 2

## 2024-07-31 MED ORDER — IPRATROPIUM-ALBUTEROL 0.5-2.5 (3) MG/3ML IN SOLN
3.0000 mL | Freq: Four times a day (QID) | RESPIRATORY_TRACT | Status: DC | PRN
Start: 2024-07-31 — End: 2024-08-06

## 2024-07-31 MED ORDER — PENTAFLUOROPROP-TETRAFLUOROETH EX AERO
1.0000 | INHALATION_SPRAY | CUTANEOUS | Status: DC | PRN
  Filled 2024-07-31: qty 30

## 2024-07-31 MED ORDER — LIDOCAINE-PRILOCAINE 2.5-2.5 % EX CREA
1.0000 | TOPICAL_CREAM | CUTANEOUS | Status: DC | PRN
  Filled 2024-07-31: qty 5

## 2024-07-31 MED ORDER — LOPERAMIDE HCL 2 MG PO CAPS
2.0000 mg | ORAL_CAPSULE | Freq: Every day | ORAL | Status: DC | PRN
Start: 2024-07-31 — End: 2024-08-06
  Administered 2024-07-31 – 2024-08-02 (×2): 2 mg via ORAL
  Filled 2024-07-31 (×2): qty 1

## 2024-07-31 NOTE — Plan of Care (Signed)
  Problem: Fluid Volume: Goal: Hemodynamic stability will improve Outcome: Progressing   Problem: Clinical Measurements: Goal: Diagnostic test results will improve Outcome: Progressing   Problem: Respiratory: Goal: Ability to maintain adequate ventilation will improve Outcome: Progressing   Problem: Education: Goal: Knowledge of General Education information will improve Description: Including pain rating scale, medication(s)/side effects and non-pharmacologic comfort measures Outcome: Progressing

## 2024-07-31 NOTE — Progress Notes (Signed)
  PROGRESS NOTE    Cassandra [name]  FMW:[id number] DOB: 1927/05/04 DOA: 07/26/2024 PCP: Cleotilde Oneil FALCON, MD  [phone]  LOS: 5 days   Brief hospital course:   Assessment & Plan: Cassandra [name] is a 89 y.o. female with medical history significant for coronary artery disease, end-stage renal disease on hemodialysis on TTS, hypertension, dyslipidemia, hypothyroidism, lumbar stenosis, RLS and type 2 diabetes mellitus, who presented to the emergency room with acute onset of fatigue and generalized weakness with altered mental status and lethargy.  Blood pressure was 79/38 with EMS.  Patient admitted to cough productive of whitish sputum   Severe sepsis due to multifocal pneumonia [facility]) -- Patient came in with altered mental status, respiratory distress, elevated white count, elevated lactic acid and multifocal pneumonia.  large consolidation in the left lowe lobe and lingula.  -- Pulmonary consultation--Dr Assaker--change to zosyn,vanc and azithromycin .  Vanc since d/c'ed. --s/p IV hydrocortisone --stress dose --cont zosyn and azithro --cont chest PT  Acute strokes --more small acute strokes on MRI brain.  Likely embolic, however, has not found Afib or Aflutter.  Pt was recently discharged with cardiac monitor, however, it's not currently present on pt. --neuro consult --monitor on tele  Bilateral pleural effusions --US  thoracentesis found no safe window to tap --HD for fluid removal  ESRD on HD --iHD per nephro    Elevated troponin 2/2 demand ischemia  --No chest pain.  IV heparin  for 48 hours per dr Fernand, completed.  Cardiology consulted, no plan for further cardiac diagnostics.  CAD --cont ASA, plavix and statin   Depression - Continue Zoloft.   Dyslipidemia -continue statin therapy.   Hypothyroidism - continue Synthroid .   Lethargy in the setting of PNA --improved after holding sedating meds    Hyperkalemia --correct with dialysis   DVT prophylaxis: Heparin   SQ Code Status: DNR  Family Communication:  Level of care: Med-Surg Dispo:   The patient is from: home Anticipated d/c is to: home.  Pt declined SNF rehab Anticipated d/c date is: 1-2 days   Subjective and Interval History:  Pt had no new complaint.  Incontinent of stool.  MRI brain showed more acute strokes.  Neuro consulted.   Objective: Vitals:   07/31/24 1258 07/31/24 1602 07/31/24 1831 07/31/24 1850  BP: (!) 150/69 (!) 159/70 (!) 96/55 (!) 137/54  Pulse: 93 92 84   Resp: 19 19 17    Temp: 98.2 F (36.8 C) 98.5 F (36.9 C) 98.7 F (37.1 C)   TempSrc:      SpO2: 93% 90% 90%   Weight:      Height:        Intake/Output Summary (Last 24 hours) at 07/31/2024 1947 Last data filed at 07/31/2024 1211 Gross per 24 hour  Intake --  Output 700 ml  Net -700 ml   Filed Weights   07/29/24 1140 07/31/24 0851 07/31/24 1226  Weight: 57.4 kg 58.7 kg 58 kg    Examination:   Constitutional: NAD, AAOx3 HEENT: conjunctivae and lids normal, EOMI CV: No cyanosis.   RESP: normal respiratory effort Neuro: II - XII grossly intact.     Data Reviewed: I have personally reviewed labs and imaging studies  Time spent: 50 minutes  Ellouise Haber, MD [facility] If 7PM-7AM, please contact night-coverage 07/31/2024, 7:47 PM

## 2024-07-31 NOTE — Progress Notes (Signed)
 [facility]  ROUNDING NOTE   Subjective:   Cassandra [name] is a 89 year old female with past medical conditions including anxiety, hypertension, RLS, neuropathy, and end-stage renal disease on hemodialysis.  Patient presents to the emergency department with weakness and confusion and has been admitted for ESRD (end stage renal disease) on dialysis (HCC) [N18.6, Z99.2] Sepsis due to pneumonia (HCC) [J18.9, A41.9] Pneumonia due to infectious organism, unspecified laterality, unspecified part of lung [J18.9] Sepsis, due to unspecified organism, unspecified whether acute organ dysfunction present [facility]) [A41.9]  Patient is known to our practice and receives dialysis at [facility] on a TTS, supervised by Dr Marcelino.    Update  Patient seen and evaluated during dialysis   HEMODIALYSIS FLOWSHEET:  Blood Flow Rate (mL/min): 350 mL/min Arterial Pressure (mmHg): -198.17 mmHg Venous Pressure (mmHg): 175.14 mmHg TMP (mmHg): 5.25 mmHg Ultrafiltration Rate (mL/min): 554 mL/min Dialysate Flow Rate (mL/min): 299 ml/min  Awake and oriented to self Moments of disorientation but easily redirected  Objective:  Vital signs in last 24 hours:  Temp:  [97.8 F (36.6 C)-98.7 F (37.1 C)] 97.8 F (36.6 C) (11/06 0851) Pulse Rate:  [75-94] 93 (11/06 0930) Resp:  [17-20] 17 (11/06 0930) BP: (97-159)/(58-86) 157/70 (11/06 0930) SpO2:  [90 %-100 %] 92 % (11/06 0930) FiO2 (%):  [32 %] 32 % (11/05 2022) Weight:  [58.7 kg] 58.7 kg (11/06 0851)  Weight change:  Filed Weights   07/29/24 0806 07/29/24 1140 07/31/24 0851  Weight: 58.4 kg 57.4 kg 58.7 kg    Intake/Output: I/O last 3 completed shifts: In: 830 [P.O.:480; IV Piggyback:350] Out: -    Intake/Output this shift:  No intake/output data recorded.  Physical Exam: General: NAD  Head: Normocephalic, atraumatic. Moist oral mucosal membranes  Eyes: Anicteric  Lungs:  Diminished, [place] O2  Heart: Regular rate and rhythm   Abdomen:  Soft, nontender  Extremities: No peripheral edema.  Neurologic: Awake and alert  Skin: Warm,dry, no rash  Access: Left upper aVF    Basic Metabolic Panel: Recent Labs  Lab 07/26/24 2015 07/27/24 0330 07/29/24 0413 07/31/24 0411  NA 135 136 134* 138  K 4.8 4.6 5.6* 4.4  CL 91* 97* 98 101  CO2 26 22 20* 24  GLUCOSE 71 65* 115* 89  BUN 20 28* 67* 67*  CREATININE 5.46* 5.60* 7.43* 6.04*  CALCIUM  8.1* 7.3* 7.9* 7.9*  MG  --   --   --  2.4    Liver Function Tests: Recent Labs  Lab 07/26/24 2015  AST 40  ALT 18  ALKPHOS 61  BILITOT 1.1  PROT 7.5  ALBUMIN  3.0*   No results for input(s): LIPASE, AMYLASE in the last 168 hours. No results for input(s): AMMONIA in the last 168 hours.  CBC: Recent Labs  Lab 07/26/24 2015 07/26/24 2330 07/27/24 0330 07/28/24 0052 07/29/24 0413 07/31/24 0930  WBC 34.4* 37.3* 30.3* 29.9* 30.9* PENDING  NEUTROABS 28.6* 30.4*  --   --   --   --   HGB 16.2* 13.9 14.1 13.7 13.8 12.3  HCT 52.2* 45.1 45.3 42.4 42.6 38.8  MCV 101.2* 102.3* 102.7* 99.5 97.9 98.0  PLT 1,075* 1,090* 971* 982* 973* 675*    Cardiac Enzymes: No results for input(s): CKTOTAL, CKMB, CKMBINDEX, TROPONINI in the last 168 hours.  BNP: Invalid input(s): POCBNP  CBG: No results for input(s): GLUCAP in the last 168 hours.  Microbiology: Results for orders placed or performed during the hospital encounter of 07/26/24  Blood culture (routine  x 2)     Status: None   Collection Time: 07/26/24  8:11 PM   Specimen: BLOOD  Result Value Ref Range Status   Specimen Description BLOOD RIGHT ANTECUBITAL  Final   Special Requests   Final    BOTTLES DRAWN AEROBIC AND ANAEROBIC Blood Culture adequate volume   Culture   Final    NO GROWTH 5 DAYS Performed at [facility], [address] [place], [place] [address]    Report Status 07/31/2024 FINAL  Final  Resp panel by RT-PCR (RSV, Flu A&B, Covid) Anterior Nasal Swab     Status: None    Collection Time: 07/26/24  8:15 PM   Specimen: Anterior Nasal Swab  Result Value Ref Range Status   SARS Coronavirus 2 by RT PCR NEGATIVE NEGATIVE Final    Comment: (NOTE) SARS-CoV-2 target nucleic acids are NOT DETECTED.  The SARS-CoV-2 RNA is generally detectable in upper respiratory specimens during the acute phase of infection. The lowest concentration of SARS-CoV-2 viral copies this assay can detect is 138 copies/mL. A negative result does not preclude SARS-Cov-2 infection and should not be used as the sole basis for treatment or other patient management decisions. A negative result may occur with  improper specimen collection/handling, submission of specimen other than nasopharyngeal swab, presence of viral mutation(s) within the areas targeted by this assay, and inadequate number of viral copies(<138 copies/mL). A negative result must be combined with clinical observations, patient history, and epidemiological information. The expected result is Negative.  Fact Sheet for Patients:  bloggercourse.com  Fact Sheet for Healthcare Providers:  seriousbroker.it  This test is no t yet approved or cleared by the [place]  FDA and  has been authorized for detection and/or diagnosis of SARS-CoV-2 by FDA under an Emergency Use Authorization (EUA). This EUA will remain  in effect (meaning this test can be used) for the duration of the COVID-19 declaration under Section 564(b)(1) of the Act, 21 U.S.C.section 360bbb-3(b)(1), unless the authorization is terminated  or revoked sooner.       Influenza A by PCR NEGATIVE NEGATIVE Final   Influenza B by PCR NEGATIVE NEGATIVE Final    Comment: (NOTE) The Xpert Xpress SARS-CoV-2/FLU/RSV plus assay is intended as an aid in the diagnosis of influenza from Nasopharyngeal swab specimens and should not be used as a sole basis for treatment. Nasal washings and aspirates are unacceptable for  Xpert Xpress SARS-CoV-2/FLU/RSV testing.  Fact Sheet for Patients: bloggercourse.com  Fact Sheet for Healthcare Providers: seriousbroker.it  This test is not yet approved or cleared by the [place]  FDA and has been authorized for detection and/or diagnosis of SARS-CoV-2 by FDA under an Emergency Use Authorization (EUA). This EUA will remain in effect (meaning this test can be used) for the duration of the COVID-19 declaration under Section 564(b)(1) of the Act, 21 U.S.C. section 360bbb-3(b)(1), unless the authorization is terminated or revoked.     Resp Syncytial Virus by PCR NEGATIVE NEGATIVE Final    Comment: (NOTE) Fact Sheet for Patients: bloggercourse.com  Fact Sheet for Healthcare Providers: seriousbroker.it  This test is not yet approved or cleared by the [place]  FDA and has been authorized for detection and/or diagnosis of SARS-CoV-2 by FDA under an Emergency Use Authorization (EUA). This EUA will remain in effect (meaning this test can be used) for the duration of the COVID-19 declaration under Section 564(b)(1) of the Act, 21 U.S.C. section 360bbb-3(b)(1), unless the authorization is terminated or revoked.  Performed at [facility],  [address], [place], [place] [address]   Blood culture (routine x 2)     Status: None   Collection Time: 07/26/24  8:16 PM   Specimen: BLOOD  Result Value Ref Range Status   Specimen Description BLOOD RIGHT ANTECUBITAL  Final   Special Requests   Final    BOTTLES DRAWN AEROBIC AND ANAEROBIC Blood Culture results may not be optimal due to an inadequate volume of blood received in culture bottles   Culture   Final    NO GROWTH 5 DAYS Performed at [facility], [address], [place], [place] [address]    Report Status 07/31/2024 FINAL  Final  MRSA Next Gen by PCR, Nasal     Status: None    Collection Time: 07/27/24  5:00 PM   Specimen: Nasal Mucosa; Nasal Swab  Result Value Ref Range Status   MRSA by PCR Next Gen NOT DETECTED NOT DETECTED Final    Comment: (NOTE) The GeneXpert MRSA Assay (FDA approved for NASAL specimens only), is one component of a comprehensive MRSA colonization surveillance program. It is not intended to diagnose MRSA infection nor to guide or monitor treatment for MRSA infections. Test performance is not FDA approved in patients less than 52 years old. Performed at [facility], [address] [place], [place] [address]     Coagulation Studies: No results for input(s): LABPROT, INR in the last 72 hours.   Urinalysis: No results for input(s): COLORURINE, LABSPEC, PHURINE, GLUCOSEU, HGBUR, BILIRUBINUR, KETONESUR, PROTEINUR, UROBILINOGEN, NITRITE, LEUKOCYTESUR in the last 72 hours.  Invalid input(s): APPERANCEUR    Imaging: US  CHEST (PLEURAL EFFUSION) Result Date: 07/30/2024 INDICATION: 89 year old female presents with left pleural effusion. Received request for diagnostic and therapeutic thoracentesis. EXAM: CHEST ULTRASOUND COMPARISON:  None Available. FINDINGS: Trace left pleural effusion. No significant pocket of fluid or percutaneous window to allow safe thoracentesis. Risks outweigh the benefits. IMPRESSION: Trace left pleural effusion with no safe window for percutaneous access. No procedure performed. Read by: Kristi Davenport, NP Electronically Signed   By: Wilkie Lent M.D.   On: 07/30/2024 16:32   MR BRAIN WO CONTRAST Result Date: 07/30/2024 EXAM: MRI BRAIN WITHOUT CONTRAST 07/30/2024 02:37:26 PM TECHNIQUE: Multiplanar multisequence MRI of the head/brain was performed without the administration of intravenous contrast. COMPARISON: None available. CLINICAL HISTORY: concern for stroke FINDINGS: BRAIN AND VENTRICLES: Multiple punctate foci of acute infarction are present within the subcortical white  matter of the posterior right temporal lobe, right parietal lobe adjacent to the atrium of the right lateral ventricle, and the high posterior right parietal cortex. A 6 mm focus of acute infarction is present in the left occipital lobe on image 68 of series 5. T2 and FLAIR signal is present in the left occipital infarct and high right posterior parietal infarct. Remote lacunar infarcts are present within the right basal ganglia. Periventricular and subcortical white matter changes are present bilaterally. No intracranial hemorrhage. No mass. No midline shift. No hydrocephalus. The sella is unremarkable. Normal flow voids. The remainder of the exam is somewhat degraded by patient motion. ORBITS: Bilateral lens replacements are noted. The globes and orbits are otherwise within normal limits. SINUSES AND MASTOIDS: No acute abnormality. BONES AND SOFT TISSUES: Normal marrow signal. No acute soft tissue abnormality. IMPRESSION: 1. Multiple punctate foci of acute infarction in the posterior right temporal lobe, right parietal lobe adjacent to the atrium of the right lateral ventricle, and high posterior right parietal cortex. T2 signal changes are present within  the right parietal lesion suggesting at least a subacute time frame of multiple infarcts involving the posterior territory 2. Acute/subacute 6 mm infarct in the left occipital lobe. 3. Remote lacunar infarcts in the right basal ganglia. Electronically signed by: Lonni Necessary MD 07/30/2024 04:00 PM EST RP Workstation: [id number]   DG Chest Port 1 View Result Date: 07/30/2024 EXAM: 1 VIEW(S) XRAY OF THE CHEST 07/30/2024 12:53:00 AM COMPARISON: 07/26/2024. CLINICAL HISTORY: Pneumonia. FINDINGS: LUNGS AND PLEURA: Diffuse peripheral predominant interstitial reticulation is favored to represent sequelae of chronic lung disease possibly early pulmonary fibrosis. Superimposed mild edema not excluded. Progressive airspace consolidation within the left mid and  left lower lung concerning for worsening pneumonia. Moderate left pleural effusion. No pneumothorax. HEART AND MEDIASTINUM: Cardiomegaly. Calcified aorta. BONES AND SOFT TISSUES: Old right rib fractures. IMPRESSION: 1. Progressive left mid and lower lung consolidation concerning for worsening pneumonia. 2. Moderate left pleural effusion. Electronically signed by: Waddell Calk MD 07/30/2024 06:29 AM EST RP Workstation: [id number]     Medications:    piperacillin-tazobactam (ZOSYN)  IV 2.25 g (07/31/24 0545)    aspirin  EC  81 mg Oral Daily   calcium  acetate  1,334 mg Oral TID WC   Chlorhexidine  Gluconate Cloth  6 each Topical Q0600   clopidogrel  75 mg Oral Daily   feeding supplement (NEPRO CARB STEADY)  237 mL Oral BID BM   guaiFENesin  600 mg Oral BID   heparin  injection (subcutaneous)  5,000 Units Subcutaneous Q8H   levothyroxine   88 mcg Oral Q0600   rosuvastatin   10 mg Oral Daily   sertraline  50 mg Oral Daily   acetaminophen  **OR** acetaminophen , hydrALAZINE , ipratropium-albuterol , lidocaine , magnesium hydroxide, ondansetron  **OR** ondansetron  (ZOFRAN ) IV  Assessment/ Plan:  Cassandra [name] is a 89 y.o.  female with past medical conditions including anxiety, hypertension, RLS, neuropathy, and end-stage renal disease on hemodialysis.  Patient presents to the emergency department with weakness and confusion and has been admitted for ESRD (end stage renal disease) on dialysis (HCC) [N18.6, Z99.2] Sepsis due to pneumonia (HCC) [J18.9, A41.9] Pneumonia due to infectious organism, unspecified laterality, unspecified part of lung [J18.9] Sepsis, due to unspecified organism, unspecified whether acute organ dysfunction present (HCC) [A41.9]  [facility]/TTS/left aVF   End-stage renal disease on hemodialysis. Receiving dialysis today, UF 1L as tolerated. Next treatment scheduled for Saturday.   2. Anemia of chronic kidney disease Lab Results  Component Value Date   HGB  12.3 07/31/2024    Hemoglobin within optimal range.  3. Secondary Hyperparathyroidism: with outpatient labs: None available Lab Results  Component Value Date   CALCIUM  7.9 (L) 07/31/2024   CAION 1.11 (L) 05/25/2023   PHOS 7.0 (H) 07/17/2024   Awaiting updated phos. Continue calcium  acetate with meals.  4. Hypertension with chronic kidney disease. Home regimen includes amlodipine  and clonidine . Blood pressure 161/81 during dialysis   LOS: 5 Tynslee Bowlds 11/6/202510:06 AM

## 2024-07-31 NOTE — Progress Notes (Signed)
  Received patient in bed to unit.   Informed consent signed and in chart.    TX duration:2.45  signed off 30 min early was yelling across the room to take me off, take me off now      Transported back to floor  Hand-off given to patient's nurse. No acute distress noteed    Access used: L AVF Access issues: none   Total UF removed: 0.7L Medication(s) given: none Post HD VS: wnl   Olivia Hurst LPN Kidney Dialysis Unit

## 2024-07-31 NOTE — Progress Notes (Signed)
 PT Cancellation Note  Patient Details Name: Cassandra [name] MRN: [id number] DOB: 1927/08/10   Cancelled Treatment:    Reason Eval/Treat Not Completed: Other (comment). Pt currently at HD. Will plan to see next available date.   Tempie Gibeault 07/31/2024, 10:13 AM Corean Dade, PT, DPT, [facility] [phone]

## 2024-07-31 NOTE — Progress Notes (Signed)
 Spoke with husband Thermon) confirmed patients cell phone is with him at home.

## 2024-07-31 NOTE — Progress Notes (Signed)
 SLP Cancellation Note  Patient Details Name: KACIA HALLEY MRN: [id number] DOB: January 16, 1927   Cancelled treatment:       Reason Eval/Treat Not Completed: Patient at procedure or test/unavailable (dialysis)  Pt at HD- will follow up as schedule allows and pt is appropriate.   Delbra Zellars Clapp, MS, CCC-SLP Speech Language Pathologist Rehab Services; [facility] [phone] (ascom)   Hrishikesh Hoeg J Clapp 07/31/2024, 12:24 PM

## 2024-07-31 NOTE — Progress Notes (Signed)
 Palliative Care Progress Note, Assessment & Plan   Patient Name: Cassandra [name]       Date: 07/31/2024 DOB: [birth year]  Age: 89 y.o. MRN#: [id number] Attending Physician: Awanda City, MD Primary Care Physician: Cleotilde Oneil FALCON, MD Admit Date: 07/26/2024  Subjective: Patient is sitting up in bed attempting to eat.  She has a splint in her hand but is attempting to eat the overall uptake in front of her.  She is awake and alert, acknowledges my presence.  No family or friends present at bedside during my visit.  HPI: 89 y.o. female  with past medical history of CAD, ESRD (HD - TTS), HTN, dyslipidemia, hypothyroidism, and type 2 diabetes mellitus, admitted on 07/26/2024 with fatigue, generalized weakness, and lethargy.   Upon presentation to HD on 11/1, patient was encephalopathic, more lethargic, and therefore EMS was called.  EMS arrived to outpatient HD clinic and found patient to be hypotensive and hypoxic.   Radiograph chest revealed dense left mid to lower lung airspace disease most consistent with lingular pneumonia with a small likely parapneumonic effusion.   CT of chest and abdomen revealed multifocal consolidation greatest in the left lower lobe and lingula compatible with pneumonia as well as cholelithiasis without acute cholecystitis, bilateral renal atrophy, colonic diverticulosis, aortic atherosclerosis, and coronary artery disease.   Patient is being treated for severe sepsis due to multifocal pneumonia, elevated troponin without complaint of chest pain (likely demand ischemia with sepsis), and lethargy in the setting of pneumonia.   PMT was consulted to support patient and family with goals of care discussions.  Summary of counseling/coordination of care: Extensive chart review completed  prior to meeting patient including labs, vital signs, imaging, progress notes, orders, and available advanced directive documents from current and previous encounters.   After reviewing the patient's chart and assessing the patient at bedside, I spoke with patient in regards to symptom management and goals of care.   Mentation and symptoms assessed.  Patient does not recall meeting with me yesterday and appeared surprised when I recall facts such as her 61-year marriage and being from [place] .  However, she requested a warm towel, warm blankets, and that I get her nursing assistant Prentice to bedside.  I offered to provide care but she was insistent that she preferred to work with Prentice.  I attempted to assess patient's symptoms.  She denies pain or discomfort at this time.  She shares she just wants a warm blanket.  Discussed with patient how she feels after dialysis.  She was able to recall that she came off of dialysis 30 minutes early.  When asked to further elaborate on why she removed herself from HD prior to completing her treatment, she said that she couldn't take it any more.  She did not share what exactly made her want to stop treatment early.  Discussed importance of completing entire HD treatment.  However, she continued to focus on wanting to see her nurse tech Prentice.  Patient's mentation is significantly different from yesterday.  She is oriented to self but I am concerned that I found the patient trying to eat out of a roll of tape and did not know it until  I switched it out for a Magic cup.  Notified diplomatic services operational officer that patient is requesting nurse tech Bernardo presents.  As per chart review, patient's MRI reflects acute infarcts.  Secure chatted with attending.  Neurology plans to discuss results of MRI with patient's husband tomorrow.  No change to plan of care at this time.  PMT will continue to follow and support.  I am off service but will ask a colleague to follow-up with  patient's husband after neurology has given their update and recommendations.  Physical Exam Vitals reviewed.  Constitutional:      General: She is not in acute distress.    Appearance: She is normal weight.  HENT:     Head: Normocephalic.     Nose: Nose normal.     Mouth/Throat:     Mouth: Mucous membranes are moist.  Eyes:     Pupils: Pupils are equal, round, and reactive to light.  Pulmonary:     Effort: Pulmonary effort is normal.  Abdominal:     Palpations: Abdomen is soft.  Musculoskeletal:     Comments: MAETC, generalized weakness  Skin:    General: Skin is warm and dry.  Neurological:     Mental Status: She is alert.     Comments: Oriented to self and place             50 minute visit includes: Detailed review of medical records (labs, imaging, vital signs), medically appropriate exam (mental status, respiratory, cardiac, skin), discussed with treatment team, counseling and educating patient, family and staff, documenting clinical information, medication management and coordination of care.  Lamarr L. Arvid, DNP, FNP-BC Palliative Medicine Team

## 2024-07-31 NOTE — Plan of Care (Signed)
  Problem: Fluid Volume: Goal: Hemodynamic stability will improve Outcome: Progressing   Problem: Clinical Measurements: Goal: Diagnostic test results will improve Outcome: Progressing   Problem: Respiratory: Goal: Ability to maintain adequate ventilation will improve Outcome: Progressing   Problem: Respiratory: Goal: Ability to maintain adequate ventilation will improve Outcome: Progressing   Problem: Clinical Measurements: Goal: Ability to maintain clinical measurements within normal limits will improve Outcome: Progressing   Problem: Activity: Goal: Risk for activity intolerance will decrease Outcome: Progressing

## 2024-07-31 NOTE — Progress Notes (Signed)
 NAME:  Cassandra [name], MRN:  [id number], DOB:  [birth year], LOS: 5 ADMISSION DATE:  07/26/2024, CONSULTATION DATE:  07/27/2024 REFERRING MD:  Leita Blanch MD, CHIEF COMPLAINT:  CAP   History of Present Illness:  Cassandra [name] is a 89 year old female patient with a past medical history of coronary artery disease, end-stage renal disease on HD Tuesday Thursday Saturday, hypertension, dyslipidemia, hypothyroidism, type 2 diabetes mellitus who presented to the emergency department on 07/26/2024 with fatigue and generalized weakness.  She was found to be encephalopathic.  Apparently last known well the morning of her presentation then underwent HD and after HD she was noted to be more lethargic.  EMS were called and and found her to be hypotensive and therefore she was brought in to the emergency department.  She was found to be slightly hypotensive and responded to fluids.  She was also found to be hypoxic and was placed on 10 L nasal cannula.  Labs were notable for an elevated white count of 34.4 with neutrophilic predominance at 85%.  Platelets were elevated at 1000.  Lactic acid of 3.4.  Electrolytes were within normal range.  Creatinine was 5.46 mg/dL but she is a hemodialysis patient.  CT chest on 11/1 with multifocal large consolidation greatest in the left lower lobe and lingula.  She was admitted and started on ceftriaxone  and azithromycin .  Pulmonary team is being consulted for help with further management.   07/30/24- patient has improved quite a bit, she's only on 2-3L/min.  After stopping her gabapentin , requip , solucortef, benadryl  she is awake and interactive she was able to tell me correct year & month but was unsure if she's is at [facility] in [place] vs [facility] which is imprressive. Her CXR shows moderate left effusion I have placed IR consult for thoracentesis.  She is close to baseline.    07/31/24- patient had IR evaluation with no additional effusion large enough to aspirate.  Patient is  stable from pulmonary perspective and I will sign off at this time and am available if needed.    IMAGING   Narrative & Impression  EXAM: CT CHEST, ABDOMEN AND PELVIS WITHOUT CONTRAST 03/05/2023 09:23:51 PM   TECHNIQUE: CT of the chest, abdomen and pelvis was performed without the administration of intravenous contrast. Multiplanar reformatted images are provided for review. Automated exposure control, iterative reconstruction, and/or weight based adjustment of the mA/kV was utilized to reduce the radiation dose to as low as reasonably achievable.   COMPARISON: None available.   CLINICAL HISTORY: Sepsis.   FINDINGS:   CHEST:   MEDIASTINUM AND LYMPH NODES: Mild cardiomegaly. Diffuse coronary artery and aortic atherosclerosis. The central airways are clear. No mediastinal, hilar or axillary lymphadenopathy.   LUNGS AND PLEURA: Consolidation in the left lower lobe and lingula and to a lesser extent right lower lobe compatible with pneumonia. No pleural effusion or pneumothorax.   ABDOMEN AND PELVIS:   LIVER: The liver is unremarkable.   GALLBLADDER AND BILE DUCTS: Small layering gallstones within the gallbladder. No evidence of acute cholecystitis. No biliary ductal dilatation.   SPLEEN: No acute abnormality.   PANCREAS: No acute abnormality.   ADRENAL GLANDS: No acute abnormality.   KIDNEYS, URETERS AND BLADDER: Atrophic kidneys bilaterally. Left renal mid pole cyst measures 3.5 cm. Per consensus, no follow-up is needed for simple Bosniak type 1 and 2 renal cysts, unless the patient has a malignancy history or risk factors. The urinary bladder is decompressed. No stones in the kidneys or ureters. No  hydronephrosis. No perinephric or periureteral stranding.   GI AND BOWEL: Stomach demonstrates no acute abnormality. Postoperative changes in the rectosigmoid colon. Scattered colonic diverticulosis, most pronounced in the left colon. There is no bowel  obstruction.   REPRODUCTIVE ORGANS: Prior hysterectomy.   PERITONEUM AND RETROPERITONEUM: No ascites. No free air.   VASCULATURE: Aorta is normal in caliber.   ABDOMINAL AND PELVIS LYMPH NODES: No lymphadenopathy.   BONES AND SOFT TISSUES: Diffuse degenerative disc and facet disease throughout the thoracolumbar spine. No focal soft tissue abnormality.   IMPRESSION: 1. Multifocal consolidation greatest in the left lower lobe and lingula, compatible with pneumonia. 2. Cholelithiasis without acute cholecystitis. 3. Bilateral renal atrophy. 4. Colonic diverticulosis. 5. Aortic atherosclerosis. Coronary artery disease.   Electronically signed by: Franky Crease MD 07/26/2024 09:37 PM EDT RP Workstation: [id number]   Objective    Blood pressure (!) 154/82, pulse 75, temperature 97.8 F (36.6 C), temperature source Oral, resp. rate 19, height 5' 1 (1.549 m), weight 58.7 kg, SpO2 90%.    FiO2 (%):  [32 %] 32 %   Intake/Output Summary (Last 24 hours) at 07/31/2024 0932 Last data filed at 07/30/2024 1420 Gross per 24 hour  Intake 0 ml  Output --  Net 0 ml   Filed Weights   07/29/24 0806 07/29/24 1140 07/31/24 0851  Weight: 58.4 kg 57.4 kg 58.7 kg    Examination: General: NAD.  HENT: Supple neck, reactive pupils Lungs: Diminished air entry over the left hemithorax.  Cardiovascular: Normal S1, Normal S2, RRR Abdomen: Soft, non tender, non distended  Extremities: Warm, no edema.   Labs and imaging were reviewed.  Assessment and Plan  Cassandra [name] is a 89 year old female patient with a past medical history of coronary artery disease, end-stage renal disease on HD Tuesday Thursday Saturday, hypertension, dyslipidemia, hypothyroidism, type 2 diabetes mellitus who presented to the emergency department on 07/26/2024 with fatigue and generalized weakness.  She was found to have a large left lower and lingular consolidation.  # Sepsis -PRESENT ON ADMISSION - DUE TO PNEUMONIA- on  zosyn now improved with trnding down wbc count #Bilateral pleural effusions - IR US  thoracentesis not able to aspirate due to low vol effusion  # Multifocal pneumonia with large consolidation in the left lowe lobe and lingula. -IMPROVED  # ESRD on HD TTS  Plan [] -DC SOLUCORTEF, DC BENADRYL , DC GABAPENTIN , DC REQUIP  CONTINUE ZITHROMAX  AND ZOSYN [] CRP TREND TO CONFIRM NON FLUID RELATED CHANGES []  Titrate for SpO2 > 90%.  -START CHEST PT WITH INCENTIVE SPIROMETRY FLUTTER VALVE AND PT/OT WHEN ABLE I personally spent a total of 31 minutes in the care of the patient today including preparing to see the patient, getting/reviewing separately obtained history, performing a medically appropriate exam/evaluation, counseling and educating, placing orders, documenting clinical information in the EHR, independently interpreting results, and communicating results.   Halina Picking, MD 07/31/2024 9:32 AM

## 2024-07-31 NOTE — TOC Progression Note (Signed)
 Transition of Care [facility]) - Progression Note    Patient Details  Name: Cassandra [name] MRN: [id number] Date of Birth: December 16, 1926  Transition of Care [facility]) CM/SW Contact  Shasta DELENA Daring, RN Phone Number: 07/31/2024, 2:51 PM  Clinical Narrative:    RNCM provided SNF placement offers in patient room and called husband to advise. He said he will review them. Will follow up tomorrow.      Barriers to Discharge: Continued Medical Work up               Expected Discharge Plan and Services     Post Acute Care Choice: Resumption of Svcs/PTA Provider Living arrangements for the past 2 months: Apartment                           HH Arranged: RN, PT [facility] Agency: [facility] Date [facility] Agency Contacted: 07/28/24   Representative spoke with at [facility] Agency: [place]   Social Drivers of Health (SDOH) Interventions SDOH Screenings   Food Insecurity: No Food Insecurity (07/27/2024)  Housing: Low Risk  (07/27/2024)  Transportation Needs: No Transportation Needs (07/27/2024)  Utilities: Not At Risk (07/27/2024)  Financial Resource Strain: Low Risk  (06/25/2024)   Received from [facility]  Social Connections: Moderately Integrated (07/27/2024)  Tobacco Use: Medium Risk (07/28/2024)    Readmission Risk Interventions    07/28/2024    2:52 PM 03/09/2023    4:09 PM  Readmission Risk Prevention Plan  Transportation Screening Complete Complete  PCP or Specialist Appt within 3-5 Days  Complete  HRI or Home Care Consult  Complete  Social Work Consult for Recovery Care Planning/Counseling  Complete  Palliative Care Screening  Not Applicable  Medication Review [employer]) Complete Complete  PCP or Specialist appointment within 3-5 days of discharge Complete   HRI or Home Care Consult Complete   SW Recovery Care/Counseling Consult Complete   Palliative Care Screening Not Applicable   Skilled Nursing Facility Not Applicable

## 2024-08-01 DIAGNOSIS — J189 Pneumonia, unspecified organism: Secondary | ICD-10-CM | POA: Diagnosis not present

## 2024-08-01 DIAGNOSIS — I6340 Cerebral infarction due to embolism of unspecified cerebral artery: Secondary | ICD-10-CM

## 2024-08-01 DIAGNOSIS — R29707 NIHSS score 7: Secondary | ICD-10-CM | POA: Diagnosis not present

## 2024-08-01 DIAGNOSIS — A419 Sepsis, unspecified organism: Secondary | ICD-10-CM | POA: Diagnosis not present

## 2024-08-01 LAB — GASTROINTESTINAL PANEL BY PCR, STOOL (REPLACES STOOL CULTURE)

## 2024-08-01 LAB — LIPID PANEL
Cholesterol: 136 mg/dL (ref 0–200)
HDL: 56 mg/dL (ref >40)
LDL Cholesterol: 63 mg/dL (ref 0–99)
Total CHOL/HDL Ratio: 2.4 ratio
Triglycerides: 86 mg/dL (ref <150)
VLDL: 17 mg/dL (ref 0–40)

## 2024-08-01 LAB — BASIC METABOLIC PANEL WITH GFR
Anion gap: 16 — ABNORMAL HIGH (ref 5–15)
BUN: 40 mg/dL — ABNORMAL HIGH (ref 8–23)
CO2: 24 mmol/L (ref 22–32)
Calcium: 8.4 mg/dL — ABNORMAL LOW (ref 8.9–10.3)
Chloride: 98 mmol/L (ref 98–111)
Creatinine, Ser: 4.46 mg/dL — ABNORMAL HIGH (ref 0.44–1.00)
GFR, Estimated: 9 mL/min — ABNORMAL LOW (ref >60)
Glucose, Bld: 110 mg/dL — ABNORMAL HIGH (ref 70–99)
Potassium: 3.8 mmol/L (ref 3.5–5.1)
Sodium: 138 mmol/L (ref 135–145)

## 2024-08-01 LAB — MAGNESIUM: Magnesium: 2.2 mg/dL (ref 1.7–2.4)

## 2024-08-01 NOTE — Consult Note (Signed)
 NEUROLOGY CONSULT NOTE   Date of service: August 01, 2024 Patient Name: Cassandra [name] MRN:  [id number] DOB:  1926-10-19 Chief Complaint: recurrent embolic strokes Requesting Provider: Awanda City, MD  History of Present Illness  Cassandra [name] is a 89 y.o. female with hx of recent hospitalization for embolic stroke discharged with ambulatory cardiac monitor, CAD, ESRD on dialysis TTS, HTN, HL, DM2 p/w acute onset generalized weakness and lethargy and admitted 2/2 PNA. MRI brain showed multiple punctate foci of acute infarct in the R temporal and parietal lobes and L occipital lobe. She denies any acute neurologic deficits.  NIHSS components Score: Comment  1a Level of Conscious 0[x]  1[]  2[]  3[]      1b LOC Questions 0[]  1[]  2[x]       1c LOC Commands 0[x]  1[]  2[]       2 Best Gaze 0[x]  1[]  2[]       3 Visual 0[x]  1[]  2[]  3[]      4 Facial Palsy 0[x]  1[]  2[]  3[]      5a Motor Arm - left 0[]  1[x]  2[]  3[]  4[]  UN[]    5b Motor Arm - Right 0[]  1[x]  2[]  3[]  4[]  UN[]    6a Motor Leg - Left 0[]  1[x]  2[]  3[]  4[]  UN[]    6b Motor Leg - Right 0[]  1[x]  2[]  3[]  4[]  UN[]    7 Limb Ataxia 0[x]  1[]  2[]  UN[]      8 Sensory 0[x]  1[]  2[]  UN[]      9 Best Language 0[x]  1[]  2[]  3[]      10 Dysarthria 0[]  1[x]  2[]  UN[]      11 Extinct. and Inattention 0[x]  1[]  2[]       TOTAL:  7      ROS   Will not participate in ROS  Past History   Past Medical History:  Diagnosis Date   Anxiety    Aortic atherosclerosis    Arthritis    osteoarthritis in hands, knees ( R torn cartilage, L meniscus removed)    Atherosclerosis of native coronary artery of native heart with unstable angina pectoris (HCC)    CAD (coronary artery disease) 1999   a.) s/p PCI (details unkown) in [place]  in 1999   Campylobacter antigen positive 11/29/2014   a.) presented to [facility] ED with N/V/D; stool culture (+)   ESRD on hemodialysis (HCC)    a.) T-Th-Sat   Glaucoma    Hypercholesterolemia    Hyperlipidemia    Hypertension     Hypothyroidism    Lumbar stenosis with neurogenic claudication    Perforation of colon as colonoscopy complication (HCC) 2010   a.) required short term colostomy with subsequent take down/reversal   RLS (restless legs syndrome)    Thrombocytosis    Type 2 diabetes mellitus with chronic kidney disease (HCC)     Past Surgical History:  Procedure Laterality Date   A/V FISTULAGRAM Left 12/25/2023   Procedure: A/V Fistulagram;  Surgeon: Jama Cordella MATSU, MD;  Location: [facility] INVASIVE CV LAB;  Service: Cardiovascular;  Laterality: Left;   ABDOMINAL HYSTERECTOMY     left ovary removed   AV FISTULA PLACEMENT Left 05/25/2023   Procedure: ARTERIOVENOUS (AV) FISTULA CREATION (BRACHIALCEPHALIC);  Surgeon: Jama Cordella MATSU, MD;  Location: [facility] ORS;  Service: Vascular;  Laterality: Left;   BLEPHAROPLASTY Bilateral    CATARACT EXTRACTION W/ INTRAOCULAR LENS  IMPLANT, BILATERAL     COLONOSCOPY  2010   COLOSTOMY  03/2009   perforated colon from colonoscopy   COLOSTOMY REVERSAL  08/2009   CORONARY ANGIOPLASTY WITH STENT PLACEMENT  1999   DIALYSIS/PERMA CATHETER INSERTION N/A 03/08/2023   Procedure: DIALYSIS/PERMA CATHETER INSERTION;  Surgeon: Marea Selinda RAMAN, MD;  Location: [facility] INVASIVE CV LAB;  Service: Cardiovascular;  Laterality: N/A;   DIALYSIS/PERMA CATHETER REMOVAL N/A 09/05/2023   Procedure: DIALYSIS/PERMA CATHETER REMOVAL;  Surgeon: Marea Selinda RAMAN, MD;  Location: [facility] INVASIVE CV LAB;  Service: Cardiovascular;  Laterality: N/A;   FOOT SURGERY Bilateral    toes correction   KNEE ARTHROSCOPY Right    torn cartilage   KNEE ARTHROSCOPY W/ MENISCECTOMY Left 01/2012   TEMPORARY DIALYSIS CATHETER N/A 03/05/2023   Procedure: TEMPORARY DIALYSIS CATHETER;  Surgeon: Marea Selinda RAMAN, MD;  Location: [facility] INVASIVE CV LAB;  Service: Cardiovascular;  Laterality: N/A;    Family History: Family History  Problem Relation Age of Onset   Hypertension Mother    Hypertension Father    Early death Father     Heart disease Father     Social History  reports that she quit smoking about 44 years ago. Her smoking use included cigarettes. She has never used smokeless tobacco. She reports current alcohol use. She reports that she does not use drugs.  Allergies  Allergen Reactions   Atorvastatin Other (See Comments)    MYALGIA   Cyclobenzaprine Other (See Comments)    hallucination   Mirtazapine     Other reaction(s): Hallucination   Oxycodone-Acetaminophen  Other (See Comments)    hallucination   Paroxetine Hcl Other (See Comments)    hallucination   Penicillins Swelling    Lip and orbital swelling Tolerated 3rd generation cephalosporin (CEFTRIAXONE ) between 03/05/2023 and 03/09/2023 with no documented ADRs.   Propoxyphene Other (See Comments)    hallucination   Trazodone  Other (See Comments)    hallucination    Medications   Current Facility-Administered Medications:     stroke: early stages of recovery book, , Does not apply, Once, Matthews Elida HERO, MD   acetaminophen  (TYLENOL ) tablet 650 mg, 650 mg, Oral, Q6H PRN **OR** acetaminophen  (TYLENOL ) suppository 650 mg, 650 mg, Rectal, Q6H PRN, Mansy, Jan A, MD   aspirin  EC tablet 81 mg, 81 mg, Oral, Daily, Mansy, Jan A, MD, 81 mg at 08/01/24 0947   calcium  acetate (PHOSLO ) capsule 1,334 mg, 1,334 mg, Oral, TID WC, Mansy, Jan A, MD, 1,334 mg at 08/01/24 0900   Chlorhexidine  Gluconate Cloth 2 % PADS 6 each, 6 each, Topical, Q0600, Druscilla Bald, NP, 6 each at 08/01/24 0536   clopidogrel (PLAVIX) tablet 75 mg, 75 mg, Oral, Daily, Mansy, Jan A, MD, 75 mg at 08/01/24 0948   feeding supplement (NEPRO CARB STEADY) liquid 237 mL, 237 mL, Oral, BID BM, Mansy, Jan A, MD, 237 mL at 07/30/24 1630   guaiFENesin (MUCINEX) 12 hr tablet 600 mg, 600 mg, Oral, BID, Mansy, Jan A, MD, 600 mg at 08/01/24 0948   heparin  injection 1,000 Units, 1,000 Units, Intracatheter, PRN, Druscilla Bald, NP   heparin  injection 5,000 Units, 5,000 Units, Subcutaneous,  Q8H, Patel, Sona, MD, 5,000 Units at 08/01/24 1409   hydrALAZINE  (APRESOLINE ) injection 10 mg, 10 mg, Intravenous, Q6H PRN, Patel, Sona, MD, 10 mg at 07/28/24 1223   ipratropium-albuterol  (DUONEB) 0.5-2.5 (3) MG/3ML nebulizer solution 3 mL, 3 mL, Nebulization, Q6H PRN, Awanda City, MD   levothyroxine  (SYNTHROID ) tablet 88 mcg, 88 mcg, Oral, Q0600, Mansy, Jan A, MD, 88 mcg at 08/01/24 0524   lidocaine  (XYLOCAINE ) 2 % viscous mouth solution 15 mL, 15 mL, Mouth/Throat, Q4H PRN, Mansy, Jan A, MD   lidocaine -prilocaine  (EMLA ) cream 1 Application,  1 Application, Topical, PRN, Breeze, Shantelle, NP   loperamide (IMODIUM) capsule 2 mg, 2 mg, Oral, Daily PRN, Awanda City, MD, 2 mg at 07/31/24 2055   magnesium hydroxide (MILK OF MAGNESIA) suspension 30 mL, 30 mL, Oral, Daily PRN, Mansy, Jan A, MD   melatonin tablet 5 mg, 5 mg, Oral, QHS PRN, Cleatus Delayne GAILS, MD, 5 mg at 08/01/24 0011   ondansetron  (ZOFRAN ) tablet 4 mg, 4 mg, Oral, Q6H PRN **OR** ondansetron  (ZOFRAN ) injection 4 mg, 4 mg, Intravenous, Q6H PRN, Mansy, Jan A, MD   pentafluoroprop-tetrafluoroeth (GEBAUERS) aerosol 1 Application, 1 Application, Topical, PRN, Breeze, Shantelle, NP   piperacillin-tazobactam (ZOSYN) IVPB 2.25 g, 2.25 g, Intravenous, Q8H, Awanda City, MD, Last Rate: 100 mL/hr at 08/01/24 1409, 2.25 g at 08/01/24 1409   rOPINIRole  (REQUIP ) tablet 0.5 mg, 0.5 mg, Oral, BID, Cleatus Delayne V, MD, 0.5 mg at 08/01/24 0948   rosuvastatin  (CRESTOR ) tablet 10 mg, 10 mg, Oral, Daily, Mansy, Jan A, MD, 10 mg at 08/01/24 0948   sertraline (ZOLOFT) tablet 50 mg, 50 mg, Oral, Daily, Mansy, Jan A, MD, 50 mg at 08/01/24 0948  Vitals   Vitals:   07/31/24 1850 07/31/24 1957 08/01/24 0539 08/01/24 1014  BP: (!) 137/54 (!) 145/62 (!) 148/76 (!) 155/76  Pulse:  86 84 72  Resp:  20 20 14   Temp:  98.6 F (37 C) 98.2 F (36.8 C) 98.4 F (36.9 C)  TempSrc:  Oral Oral   SpO2:  92% 93% 92%  Weight:      Height:        Body mass index is 24.16  kg/m.   Physical Exam   Gen: patient lying in bed, NAD CV: extremities appear well-perfused Resp: normal WOB  Neurologic exam MS: alert, oriented to self, year, and hospital but not age or month, follows commands Speech: mild dysarthria, no aphasia CN: PERRL, VFF, EOMI, sensation intact, face symmetric, hearing intact to voice Motor: symmetric drift in all extremities but not to bed Sensory: SILT Reflexes: 1+ symm with toes down bilat Coordination: refuses to participate Gait: deferred   Labs/Imaging/Neurodiagnostic studies   CBC:  Recent Labs  Lab 2024/08/21 2015 Aug 21, 2024 2330 07/27/24 0330 07/29/24 0413 07/31/24 0930  WBC 34.4* 37.3*   < > 30.9* 17.8*  NEUTROABS 28.6* 30.4*  --   --   --   HGB 16.2* 13.9   < > 13.8 12.3  HCT 52.2* 45.1   < > 42.6 38.8  MCV 101.2* 102.3*   < > 97.9 98.0  PLT 1,075* 1,090*   < > 973* 675*   < > = values in this interval not displayed.   Basic Metabolic Panel:  Lab Results  Component Value Date   NA 138 08/01/2024   K 3.8 08/01/2024   CO2 24 08/01/2024   GLUCOSE 110 (H) 08/01/2024   BUN 40 (H) 08/01/2024   CREATININE 4.46 (H) 08/01/2024   CALCIUM  8.4 (L) 08/01/2024   GFRNONAA 9 (L) 08/01/2024   GFRAA 25 (L) 03/30/2020   Lipid Panel:  Lab Results  Component Value Date   LDLCALC 63 08/01/2024   HgbA1c:  Lab Results  Component Value Date   HGBA1C 5.8 (H) 07/17/2024   Urine Drug Screen: No results found for: LABOPIA, COCAINSCRNUR, LABBENZ, AMPHETMU, THCU, LABBARB  Alcohol Level No results found for: [facility] INR  Lab Results  Component Value Date   INR 1.4 (H) 07/27/2024   APTT  Lab Results  Component Value Date   APTT 36  10/12/2021   AED levels: No results found for: PHENYTOIN, ZONISAMIDE, LAMOTRIGINE, LEVETIRACETA   MRI Brain(Personally reviewed): 1. Multiple punctate foci of acute infarction in the posterior right temporal lobe, right parietal lobe adjacent to the atrium of the right  lateral ventricle, and high posterior right parietal cortex. T2 signal changes are present within the right parietal lesion suggesting at least a subacute time frame of multiple infarcts involving the posterior territory 2. Acute/subacute 6 mm infarct in the left occipital lobe. 3. Remote lacunar infarcts in the right basal ganglia.  BLE US  No DVT  TTE Left ventricular ejection fraction, by estimation, is 65 to 70% . The left ventricle has normal function. The left ventricle has no regional wall motion abnormalities. There is mild concentric left ventricular hypertrophy. Left ventricular diastolic parameters are consistent with Grade I diastolic dysfunction ( impaired relaxation) . The average left ventricular global longitudinal strain is - 18. 6 % . The global longitudinal strain is normal. 2. Right ventricular systolic function is normal. The right ventricular size is normal. There is moderately elevated pulmonary artery systolic pressure. The estimated right ventricular systolic pressure is 59. 9 mmHg. 3. Right atrial size was mildly dilated. 4. The mitral valve is normal in structure. Trivial mitral valve regurgitation. No evidence of mitral stenosis. 5. Tricuspid valve regurgitation is moderate. 6. The aortic valve is tricuspid. There is moderate calcification of the aortic valve. Aortic valve regurgitation is mild. Aortic valve sclerosis/ calcification is present, without any evidence of aortic stenosis. 7. There is Moderate ( Grade III) plaque involving the aortic arch and descending aorta. 8. The inferior vena cava is normal in size with greater than 50% respiratory variability, suggesting right atrial pressure of 3 mmHg.  Stroke workup last admission 10/22:  MRI brain 1. Scattered small foci of diffusion abnormality in both cerebral hemispheres suspicious for acute to subacute embolic infarcts. 2. Mild chronic small vessel ischemic disease.  MRA head 1. Occlusion of the right vertebral  artery V4 segment, with limited visualization due to motion.  Stroke Labs     Component Value Date/Time   CHOL 136 08/01/2024 0454   TRIG 86 08/01/2024 0454   HDL 56 08/01/2024 0454   CHOLHDL 2.4 08/01/2024 0454   VLDL 17 08/01/2024 0454   LDLCALC 63 08/01/2024 0454    Lab Results  Component Value Date/Time   HGBA1C 5.8 (H) 07/17/2024 05:41 AM     ASSESSMENT   Cassandra [name] is a 89 y.o. female with hx of recent hospitalization for embolic stroke discharged with ambulatory cardiac monitor, CAD, ESRD on dialysis TTS, HTN, HL, DM2 p/w acute onset generalized weakness and lethargy and admitted 2/2 PNA. MRI brain showed multiple punctate foci of acute infarct in the R temporal and parietal lobes and L occipital lobe. She denies any acute neurologic deficits.We have not been able to obtain recording of Zio she was discharged with last admission. It appears she may have removed it early. Strongly favor etiology of stroke to be cardioembolic.  RECOMMENDATIONS   - Cardiology attempting to obtain any recording from Zio placed last visit - Discharge with another Zio patch and follow up with cardiology to consider loop placement if this is negative - Continue current statin regimen - ASA 81mg  daily + plavix 75mg  daily x90 days f/b ASA 81mg  daily monotherapy after that - Outpatient referral to neurology at hospital discharge  Cleared from neuro standpoint for discharge to SNF tomorrow. ______________________________________________________________________    Bonney Elida CHRISTELLA Matthews, MD  [facility]

## 2024-08-01 NOTE — Progress Notes (Signed)
 Ordered 30 day per [facility]

## 2024-08-01 NOTE — Progress Notes (Signed)
  PROGRESS NOTE    Cassandra [name]  FMW:[id number] DOB: 02-Aug-1927 DOA: 07/26/2024 PCP: Cleotilde Oneil FALCON, MD  [phone]  LOS: 6 days   Brief hospital course:   Assessment & Plan: Cassandra [name] is a 89 y.o. female with medical history significant for coronary artery disease, end-stage renal disease on hemodialysis on TTS, hypertension, dyslipidemia, hypothyroidism, lumbar stenosis, RLS and type 2 diabetes mellitus, who presented to the emergency room with acute onset of fatigue and generalized weakness with altered mental status and lethargy.  Blood pressure was 79/38 with EMS.  Patient admitted to cough productive of whitish sputum   Severe sepsis due to multifocal pneumonia [facility]) -- Patient came in with altered mental status, respiratory distress, elevated white count, elevated lactic acid and multifocal pneumonia.  large consolidation in the left lowe lobe and lingula.  -- Pulmonary consultation--Dr Assaker--change to zosyn,vanc and azithromycin .  Vanc since d/c'ed. --s/p IV hydrocortisone --stress dose --cont zosyn and azithro --cont chest PT  Acute strokes --more small acute strokes on MRI brain.  Likely embolic, however, has not found Afib or Aflutter.  Pt was recently discharged with cardiac monitor, however, it's not currently present on pt. --neuro consult --monitor on tele --need outpatient cardiac monitor  Bilateral pleural effusions --US  thoracentesis found no safe window to tap --HD for fluid removal  ESRD on HD --iHD per nephro   Elevated troponin 2/2 demand ischemia  --No chest pain.  IV heparin  for 48 hours per dr Fernand, completed.  Cardiology consulted, no plan for further cardiac diagnostics.  CAD --cont ASA, plavix and statin   Depression - Continue Zoloft.   Dyslipidemia -continue statin therapy.   Hypothyroidism - continue Synthroid .   Lethargy in the setting of PNA --improved after holding sedating meds    Hyperkalemia --correct with  dialysis  Leukocytosis, chronic   DVT prophylaxis: Heparin  SQ Code Status: DNR  Family Communication:  Level of care: Med-Surg Dispo:   The patient is from: home Anticipated d/c is to: SNF rehab Anticipated d/c date is: monday   Subjective and Interval History:  RN reported pt having diarrhea.   Objective: Vitals:   07/31/24 1957 08/01/24 0539 08/01/24 1014 08/01/24 1817  BP: (!) 145/62 (!) 148/76 (!) 155/76 (!) 155/71  Pulse: 86 84 72 79  Resp: 20 20 14  (!) 22  Temp: 98.6 F (37 C) 98.2 F (36.8 C) 98.4 F (36.9 C) 98.2 F (36.8 C)  TempSrc: Oral Oral    SpO2: 92% 93% 92% 95%  Weight:      Height:        Intake/Output Summary (Last 24 hours) at 08/01/2024 1940 Last data filed at 08/01/2024 0100 Gross per 24 hour  Intake 100 ml  Output --  Net 100 ml   Filed Weights   07/29/24 1140 07/31/24 0851 07/31/24 1226  Weight: 57.4 kg 58.7 kg 58 kg    Examination:   Constitutional: NAD CV: No cyanosis.   RESP: normal respiratory effort   Data Reviewed: I have personally reviewed labs and imaging studies  Time spent: 35 minutes  Cassandra Haber, MD [facility] If 7PM-7AM, please contact night-coverage 08/01/2024, 7:40 PM

## 2024-08-01 NOTE — Progress Notes (Signed)
       CROSS COVER NOTE  NAME: Cassandra [name] MRN: [id number] DOB : 09/16/1927    Concern as stated by nurse / staff   generalized weakness, altered mental status from 2C. she's been having diarrhea today. cdiff is negative according to the chart BM it was brow in color. she came to 1C at 2150, she had 3 Bms already with black in color      Pertinent findings on chart review:   Patient Assessment    Latest Ref Rng & Units 08/01/2024   11:40 PM 07/31/2024    9:30 AM 07/29/2024    4:13 AM  CBC  WBC 4.0 - 10.5 K/uL 23.9  17.8  30.9   Hemoglobin 12.0 - 15.0 g/dL 86.8  87.6  86.1   Hematocrit 36.0 - 46.0 % 41.5  38.8  42.6   Platelets 150 - 400 K/uL 717  675  973      Assessment and  Interventions   Assessment:  Black stool   Plan: Serial H/H--->stable X X

## 2024-08-01 NOTE — Progress Notes (Signed)
 [facility]  ROUNDING NOTE   Subjective:   Cassandra [name] is a 89 year old female with past medical conditions including anxiety, hypertension, RLS, neuropathy, and end-stage renal disease on hemodialysis.  Patient presents to the emergency department with weakness and confusion and has been admitted for ESRD (end stage renal disease) on dialysis (HCC) [N18.6, Z99.2] Sepsis due to pneumonia (HCC) [J18.9, A41.9] Pneumonia due to infectious organism, unspecified laterality, unspecified part of lung [J18.9] Sepsis, due to unspecified organism, unspecified whether acute organ dysfunction present [facility]) [A41.9]  Patient is known to our practice and receives dialysis at [facility] on a TTS, supervised by Dr Marcelino.    Update  Patient seen resting quietly in bed No family present More alert and oriented to self. Untouched breakfast tray at bedside   Objective:  Vital signs in last 24 hours:  Temp:  [98.2 F (36.8 C)-98.7 F (37.1 C)] 98.4 F (36.9 C) (11/07 1014) Pulse Rate:  [72-92] 72 (11/07 1014) Resp:  [14-20] 14 (11/07 1014) BP: (96-159)/(54-76) 155/76 (11/07 1014) SpO2:  [90 %-93 %] 92 % (11/07 1014)  Weight change:  Filed Weights   07/29/24 1140 07/31/24 0851 07/31/24 1226  Weight: 57.4 kg 58.7 kg 58 kg    Intake/Output: I/O last 3 completed shifts: In: 100 [P.O.:100] Out: 700 [Other:700]   Intake/Output this shift:  No intake/output data recorded.  Physical Exam: General: NAD  Head: Normocephalic, atraumatic. Moist oral mucosal membranes  Eyes: Anicteric  Lungs:  Diminished, [place] O2  Heart: Regular rate and rhythm  Abdomen:  Soft, nontender  Extremities: No peripheral edema.  Neurologic: Awake and alert, oriented to self  Skin: Warm,dry, no rash  Access: Left upper aVF    Basic Metabolic Panel: Recent Labs  Lab 07/26/24 2015 07/27/24 0330 07/29/24 0413 07/31/24 0411 08/01/24 0454  NA 135 136 134* 138 138  K 4.8 4.6 5.6* 4.4 3.8   CL 91* 97* 98 101 98  CO2 26 22 20* 24 24  GLUCOSE 71 65* 115* 89 110*  BUN 20 28* 67* 67* 40*  CREATININE 5.46* 5.60* 7.43* 6.04* 4.46*  CALCIUM  8.1* 7.3* 7.9* 7.9* 8.4*  MG  --   --   --  2.4 2.2  PHOS  --   --   --  3.3  --     Liver Function Tests: Recent Labs  Lab 07/26/24 2015  AST 40  ALT 18  ALKPHOS 61  BILITOT 1.1  PROT 7.5  ALBUMIN  3.0*   No results for input(s): LIPASE, AMYLASE in the last 168 hours. No results for input(s): AMMONIA in the last 168 hours.  CBC: Recent Labs  Lab 07/26/24 2015 07/26/24 2330 07/27/24 0330 07/28/24 0052 07/29/24 0413 07/31/24 0930  WBC 34.4* 37.3* 30.3* 29.9* 30.9* 17.8*  NEUTROABS 28.6* 30.4*  --   --   --   --   HGB 16.2* 13.9 14.1 13.7 13.8 12.3  HCT 52.2* 45.1 45.3 42.4 42.6 38.8  MCV 101.2* 102.3* 102.7* 99.5 97.9 98.0  PLT 1,075* 1,090* 971* 982* 973* 675*    Cardiac Enzymes: No results for input(s): CKTOTAL, CKMB, CKMBINDEX, TROPONINI in the last 168 hours.  BNP: Invalid input(s): POCBNP  CBG: Recent Labs  Lab 07/31/24 1252  GLUCAP 76    Microbiology: Results for orders placed or performed during the hospital encounter of 07/26/24  Blood culture (routine x 2)     Status: None   Collection Time: 07/26/24  8:11 PM   Specimen: BLOOD  Result  Value Ref Range Status   Specimen Description BLOOD RIGHT ANTECUBITAL  Final   Special Requests   Final    BOTTLES DRAWN AEROBIC AND ANAEROBIC Blood Culture adequate volume   Culture   Final    NO GROWTH 5 DAYS Performed at [facility], [address] [place], [place] [address]    Report Status 07/31/2024 FINAL  Final  Resp panel by RT-PCR (RSV, Flu A&B, Covid) Anterior Nasal Swab     Status: None   Collection Time: 07/26/24  8:15 PM   Specimen: Anterior Nasal Swab  Result Value Ref Range Status   SARS Coronavirus 2 by RT PCR NEGATIVE NEGATIVE Final    Comment: (NOTE) SARS-CoV-2 target nucleic acids are NOT DETECTED.  The  SARS-CoV-2 RNA is generally detectable in upper respiratory specimens during the acute phase of infection. The lowest concentration of SARS-CoV-2 viral copies this assay can detect is 138 copies/mL. A negative result does not preclude SARS-Cov-2 infection and should not be used as the sole basis for treatment or other patient management decisions. A negative result may occur with  improper specimen collection/handling, submission of specimen other than nasopharyngeal swab, presence of viral mutation(s) within the areas targeted by this assay, and inadequate number of viral copies(<138 copies/mL). A negative result must be combined with clinical observations, patient history, and epidemiological information. The expected result is Negative.  Fact Sheet for Patients:  bloggercourse.com  Fact Sheet for Healthcare Providers:  seriousbroker.it  This test is no t yet approved or cleared by the [place]  FDA and  has been authorized for detection and/or diagnosis of SARS-CoV-2 by FDA under an Emergency Use Authorization (EUA). This EUA will remain  in effect (meaning this test can be used) for the duration of the COVID-19 declaration under Section 564(b)(1) of the Act, 21 U.S.C.section 360bbb-3(b)(1), unless the authorization is terminated  or revoked sooner.       Influenza A by PCR NEGATIVE NEGATIVE Final   Influenza B by PCR NEGATIVE NEGATIVE Final    Comment: (NOTE) The Xpert Xpress SARS-CoV-2/FLU/RSV plus assay is intended as an aid in the diagnosis of influenza from Nasopharyngeal swab specimens and should not be used as a sole basis for treatment. Nasal washings and aspirates are unacceptable for Xpert Xpress SARS-CoV-2/FLU/RSV testing.  Fact Sheet for Patients: bloggercourse.com  Fact Sheet for Healthcare Providers: seriousbroker.it  This test is not yet approved or  cleared by the [place]  FDA and has been authorized for detection and/or diagnosis of SARS-CoV-2 by FDA under an Emergency Use Authorization (EUA). This EUA will remain in effect (meaning this test can be used) for the duration of the COVID-19 declaration under Section 564(b)(1) of the Act, 21 U.S.C. section 360bbb-3(b)(1), unless the authorization is terminated or revoked.     Resp Syncytial Virus by PCR NEGATIVE NEGATIVE Final    Comment: (NOTE) Fact Sheet for Patients: bloggercourse.com  Fact Sheet for Healthcare Providers: seriousbroker.it  This test is not yet approved or cleared by the [place]  FDA and has been authorized for detection and/or diagnosis of SARS-CoV-2 by FDA under an Emergency Use Authorization (EUA). This EUA will remain in effect (meaning this test can be used) for the duration of the COVID-19 declaration under Section 564(b)(1) of the Act, 21 U.S.C. section 360bbb-3(b)(1), unless the authorization is terminated or revoked.  Performed at [facility], [address] [place], [place] [address]   Blood culture (routine x 2)     Status: None  Collection Time: 07/26/24  8:16 PM   Specimen: BLOOD  Result Value Ref Range Status   Specimen Description BLOOD RIGHT ANTECUBITAL  Final   Special Requests   Final    BOTTLES DRAWN AEROBIC AND ANAEROBIC Blood Culture results may not be optimal due to an inadequate volume of blood received in culture bottles   Culture   Final    NO GROWTH 5 DAYS Performed at [facility], [address], [place], [place] [address]    Report Status 07/31/2024 FINAL  Final  MRSA Next Gen by PCR, Nasal     Status: None   Collection Time: 07/27/24  5:00 PM   Specimen: Nasal Mucosa; Nasal Swab  Result Value Ref Range Status   MRSA by PCR Next Gen NOT DETECTED NOT DETECTED Final    Comment: (NOTE) The GeneXpert MRSA Assay (FDA approved for NASAL specimens  only), is one component of a comprehensive MRSA colonization surveillance program. It is not intended to diagnose MRSA infection nor to guide or monitor treatment for MRSA infections. Test performance is not FDA approved in patients less than 88 years old. Performed at [facility], [address] [place], [place] [address]     Coagulation Studies: No results for input(s): LABPROT, INR in the last 72 hours.   Urinalysis: No results for input(s): COLORURINE, LABSPEC, PHURINE, GLUCOSEU, HGBUR, BILIRUBINUR, KETONESUR, PROTEINUR, UROBILINOGEN, NITRITE, LEUKOCYTESUR in the last 72 hours.  Invalid input(s): APPERANCEUR    Imaging: US  Venous Img Lower Bilateral (DVT) Result Date: 07/31/2024 EXAM: ULTRASOUND DUPLEX OF THE BILATERAL LOWER EXTREMITY VEINS TECHNIQUE: Duplex ultrasound using B-mode/gray scaled imaging and Doppler spectral analysis and color flow was obtained of the deep venous structures of the bilateral lower extremity. COMPARISON: 05/15/2021 and previous. CLINICAL HISTORY: 801713 Stroke (cerebrum) (HCC) 801713 Stroke (cerebrum) (HCC). FINDINGS: LEFT: The common femoral vein, femoral vein, popliteal vein, and posterior tibial vein of the left lower extremity demonstrate normal compressibility with normal color flow and spectral analysis. RIGHT: The common femoral vein, femoral vein, popliteal vein, and posterior tibial vein of the right lower extremity demonstrate normal compressibility with normal color flow and spectral analysis. IMPRESSION: 1. No evidence of DVT. Electronically signed by: Dayne Hassell MD 07/31/2024 03:58 PM EST RP Workstation: [id number]   US  CHEST (PLEURAL EFFUSION) Result Date: 07/30/2024 INDICATION: 89 year old female presents with left pleural effusion. Received request for diagnostic and therapeutic thoracentesis. EXAM: CHEST ULTRASOUND COMPARISON:  None Available. FINDINGS: Trace left pleural effusion. No significant  pocket of fluid or percutaneous window to allow safe thoracentesis. Risks outweigh the benefits. IMPRESSION: Trace left pleural effusion with no safe window for percutaneous access. No procedure performed. Read by: Kristi Davenport, NP Electronically Signed   By: Wilkie Lent M.D.   On: 07/30/2024 16:32   MR BRAIN WO CONTRAST Result Date: 07/30/2024 EXAM: MRI BRAIN WITHOUT CONTRAST 07/30/2024 02:37:26 PM TECHNIQUE: Multiplanar multisequence MRI of the head/brain was performed without the administration of intravenous contrast. COMPARISON: None available. CLINICAL HISTORY: concern for stroke FINDINGS: BRAIN AND VENTRICLES: Multiple punctate foci of acute infarction are present within the subcortical white matter of the posterior right temporal lobe, right parietal lobe adjacent to the atrium of the right lateral ventricle, and the high posterior right parietal cortex. A 6 mm focus of acute infarction is present in the left occipital lobe on image 68 of series 5. T2 and FLAIR signal is present in the left occipital infarct and high right posterior parietal infarct. Remote lacunar infarcts are  present within the right basal ganglia. Periventricular and subcortical white matter changes are present bilaterally. No intracranial hemorrhage. No mass. No midline shift. No hydrocephalus. The sella is unremarkable. Normal flow voids. The remainder of the exam is somewhat degraded by patient motion. ORBITS: Bilateral lens replacements are noted. The globes and orbits are otherwise within normal limits. SINUSES AND MASTOIDS: No acute abnormality. BONES AND SOFT TISSUES: Normal marrow signal. No acute soft tissue abnormality. IMPRESSION: 1. Multiple punctate foci of acute infarction in the posterior right temporal lobe, right parietal lobe adjacent to the atrium of the right lateral ventricle, and high posterior right parietal cortex. T2 signal changes are present within the right parietal lesion suggesting at least a  subacute time frame of multiple infarcts involving the posterior territory 2. Acute/subacute 6 mm infarct in the left occipital lobe. 3. Remote lacunar infarcts in the right basal ganglia. Electronically signed by: Lonni Necessary MD 07/30/2024 04:00 PM EST RP Workstation: [id number]     Medications:    piperacillin-tazobactam (ZOSYN)  IV 2.25 g (08/01/24 0535)     stroke: early stages of recovery book   Does not apply Once   aspirin  EC  81 mg Oral Daily   calcium  acetate  1,334 mg Oral TID WC   Chlorhexidine  Gluconate Cloth  6 each Topical Q0600   clopidogrel  75 mg Oral Daily   feeding supplement (NEPRO CARB STEADY)  237 mL Oral BID BM   guaiFENesin  600 mg Oral BID   heparin  injection (subcutaneous)  5,000 Units Subcutaneous Q8H   levothyroxine   88 mcg Oral Q0600   rOPINIRole   0.5 mg Oral BID   rosuvastatin   10 mg Oral Daily   sertraline  50 mg Oral Daily   acetaminophen  **OR** acetaminophen , heparin , hydrALAZINE , ipratropium-albuterol , lidocaine , lidocaine -prilocaine , loperamide, magnesium hydroxide, melatonin, ondansetron  **OR** ondansetron  (ZOFRAN ) IV, pentafluoroprop-tetrafluoroeth  Assessment/ Plan:  Ms. NORITA MEIGS is a 89 y.o.  female with past medical conditions including anxiety, hypertension, RLS, neuropathy, and end-stage renal disease on hemodialysis.  Patient presents to the emergency department with weakness and confusion and has been admitted for ESRD (end stage renal disease) on dialysis (HCC) [N18.6, Z99.2] Sepsis due to pneumonia (HCC) [J18.9, A41.9] Pneumonia due to infectious organism, unspecified laterality, unspecified part of lung [J18.9] Sepsis, due to unspecified organism, unspecified whether acute organ dysfunction present (HCC) [A41.9]  [facility]/TTS/left aVF   End-stage renal disease on hemodialysis.  Dialysis received yesterday, UF 700 mL achieved.  Next treatment scheduled for Saturday.   2. Anemia of chronic kidney disease Lab  Results  Component Value Date   HGB 12.3 07/31/2024    Hemoglobin at goal.  No need for ESA's at this time.  3. Secondary Hyperparathyroidism: with outpatient labs: None available Lab Results  Component Value Date   CALCIUM  8.4 (L) 08/01/2024   CAION 1.11 (L) 05/25/2023   PHOS 3.3 07/31/2024   Calcium  and phosphorus levels acceptable for this patient.  Continue calcium  acetate with meals.  4. Hypertension with chronic kidney disease. Home regimen includes amlodipine  and clonidine . Blood pressure stable   LOS: 6 Malala Trenkamp 11/7/20251:38 PM

## 2024-08-01 NOTE — Progress Notes (Signed)
 Physical Therapy Treatment Patient Details Name: Cassandra [name] MRN: [id number] DOB: 03-16-1927 Today's Date: 08/01/2024   History of Present Illness Cassandra [name] is a 89 y.o. female with medical history significant for coronary artery disease, end-stage renal disease on hemodialysis on TTS, hypertension, dyslipidemia, hypothyroidism, lumbar stenosis, RLS and type 2 diabetes mellitus. Pt presented to ED on 07/26/24 with acute onset of fatigue and generalized weakness with altered mental status and lethargy.  07/30/24 Brain MRI: Multiple punctate foci of acute infarction in the posterior right temporal lobe, right parietal lobe adjacent to the atrium of the right lateral ventricle, and high posterior right parietal cortex.    PT Comments  Co tx with OT for safety. Pt requires min A to roll with cuing for sequencing and hand placement. Pt required total A for peri care. Pt demos gross 3/5 strength with functional observation. Pt able to perform bilateral SLR, knee flexion, and ankle DF. Pt declines OOB activity. Continues to demo deficits in strength/activity tolerance affecting her ability to safely perform functional tasks. Discharge disposition remains appropriate.    If plan is discharge home, recommend the following: Two people to help with walking and/or transfers;Two people to help with bathing/dressing/bathroom;Direct supervision/assist for medications management;Direct supervision/assist for financial management;Assist for transportation;Supervision due to cognitive status   Can travel by private vehicle     No  Equipment Recommendations  Other (comment) (TBD at next venue)    Recommendations for Other Services       Precautions / Restrictions Precautions Precautions: Fall Recall of Precautions/Restrictions: Impaired Restrictions Weight Bearing Restrictions Per Provider Order: No     Mobility  Bed Mobility Overal bed mobility: Needs Assistance Bed Mobility: Rolling,  Supine to Sit, Sit to Supine Rolling: Min assist   Supine to sit: Min assist, +2 for physical assistance Sit to supine: Min assist   General bed mobility comments: Supine<>Sit using bed rails. Repeatedly iniates return to bed with poor eccentric control. Verbal/tactile cues for hand placement and sequencing. Initially pulled up into long sitting, then sitting at EOB.    Transfers Overall transfer level: Needs assistance   Transfers: Bed to chair/wheelchair/BSC Sit to Stand: Contact guard assist          Lateral/Scoot Transfers: Contact guard assist General transfer comment: lateral scoot transfer closer to [facility].    Ambulation/Gait               General Gait Details: NT. Pt refused OOB activity   Stairs             Wheelchair Mobility     Tilt Bed    Modified Rankin (Stroke Patients Only)       Balance Overall balance assessment: Needs assistance Sitting-balance support: Feet unsupported, Bilateral upper extremity supported Sitting balance-Leahy Scale: Fair Sitting balance - Comments: CGA-SBA for safety at EOB/with long sitting. Supine>long sitting multiple times as pt actively tries to get back into bed                                    Communication Communication Communication: Impaired Factors Affecting Communication: Hearing impaired  Cognition Arousal: Alert Behavior During Therapy: Agitated   PT - Cognitive impairments: History of cognitive impairments Difficult to assess due to: Impaired communication                     PT - Cognition Comments: Pt is A&Ox4. Following commands:  Impaired Following commands impaired: Follows one step commands inconsistently    Cueing Cueing Techniques: Verbal cues, Tactile cues  Exercises Other Exercises Other Exercises: Assistance with pericare total A    General Comments General comments (skin integrity, edema, etc.): 2L of O2      Pertinent Vitals/Pain Pain  Assessment Pain Assessment: PAINAD Breathing: normal Negative Vocalization: occasional moan/groan, low speech, negative/disapproving quality Facial Expression: sad, frightened, frown Body Language: relaxed Consolability: distracted or reassured by voice/touch PAINAD Score: 3 Pain Location: bottom with pericare Pain Descriptors / Indicators: Aching Pain Intervention(s): Limited activity within patient's tolerance    Home Living                          Prior Function            PT Goals (current goals can now be found in the care plan section) Acute Rehab PT Goals PT Goal Formulation: Patient unable to participate in goal setting Time For Goal Achievement: 08/12/24 Potential to Achieve Goals: Fair Progress towards PT goals: Progressing toward goals    Frequency    Min 2X/week      PT Plan      Co-evaluation PT/OT/SLP Co-Evaluation/Treatment: Yes Reason for Co-Treatment: To address functional/ADL transfers;For patient/therapist safety;Necessary to address cognition/behavior during functional activity PT goals addressed during session: Mobility/safety with mobility OT goals addressed during session: ADL's and self-care      AM-PAC PT 6 Clicks Mobility   Outcome Measure  Help needed turning from your back to your side while in a flat bed without using bedrails?: A Lot Help needed moving from lying on your back to sitting on the side of a flat bed without using bedrails?: A Lot Help needed moving to and from a bed to a chair (including a wheelchair)?: A Lot Help needed standing up from a chair using your arms (e.g., wheelchair or bedside chair)?: A Lot Help needed to walk in hospital room?: A Lot Help needed climbing 3-5 steps with a railing? : A Lot 6 Click Score: 12    End of Session Equipment Utilized During Treatment: Oxygen Activity Tolerance: Patient limited by fatigue Patient left: in bed;with call bell/phone within reach;with bed alarm  set Nurse Communication: Mobility status PT Visit Diagnosis: Other abnormalities of gait and mobility (R26.89);Muscle weakness (generalized) (M62.81)     Time: 8678-8656 PT Time Calculation (min) (ACUTE ONLY): 22 min  Charges:                            Asier Desroches, SPT    Shaquitta Burbridge 08/01/2024, 2:59 PM

## 2024-08-01 NOTE — Progress Notes (Signed)
 Daily Progress Note   Patient Name: Cassandra [name]       Date: 08/01/2024 DOB: [birth year]  Age: 89 y.o. MRN#: [id number] Attending Physician: Awanda City, MD Primary Care Physician: Cleotilde Oneil FALCON, MD Admit Date: 07/26/2024  Reason for Consultation/Follow-up: Establishing goals of care  HPI/Brief Hospital Review: 89 y.o. female  with past medical history of CAD, ESRD (HD - TTS), HTN, dyslipidemia, hypothyroidism, and type 2 diabetes mellitus, admitted on 07/26/2024 with fatigue, generalized weakness, and lethargy.   Upon presentation to HD on 11/1, patient was encephalopathic, more lethargic, and therefore EMS was called.  EMS arrived to outpatient HD clinic and found patient to be hypotensive and hypoxic.   Radiograph chest revealed dense left mid to lower lung airspace disease most consistent with lingular pneumonia with a small likely parapneumonic effusion.   CT of chest and abdomen revealed multifocal consolidation greatest in the left lower lobe and lingula compatible with pneumonia as well as cholelithiasis without acute cholecystitis, bilateral renal atrophy, colonic diverticulosis, aortic atherosclerosis, and coronary artery disease.   Patient is being treated for severe sepsis due to multifocal pneumonia, elevated troponin without complaint of chest pain (likely demand ischemia with sepsis), and lethargy in the setting of pneumonia.   PMT was consulted to support patient and family with goals of care discussions.  Subjective: Extensive chart review has been completed prior to meeting patient including labs, vital signs, imaging, progress notes, orders, and available advanced directive documents from current and previous encounters.    Visited with [name] at her bedside.  She  is awake, alert and able to engage in conversations.  She is unable to answer orientation questions appropriately.  She reports not feeling well today without specific complaints.  She denies acute pain or discomfort.  Husband shares she was unable to rest well overnight and hopes she will be able to nap today.  Husband at bedside expresses being overwhelmed.  He shares he has not yet heard from a neurologist but anticipating their visit and hope to seek guidance and answers.  He shares he has been in contact with TOC regarding short-term rehab placement.  He is hopeful in speaking with neurology to gain realistic expectations.  Shared with [name] that we will revisit and address goals once conversation had with neurologist and recommendations made.  Answered and addressed all questions and concerns.  PMT to continue to follow for ongoing needs and support.  Objective:  Physical Exam Constitutional:      General: She is not in acute distress.    Appearance: She is ill-appearing.  HENT:     Head: Normocephalic.     Mouth/Throat:     Mouth: Mucous membranes are dry.  Pulmonary:     Effort: Pulmonary effort is normal. No respiratory distress.  Abdominal:     Palpations: Abdomen is soft.     Tenderness: There is no abdominal tenderness.  Skin:    General: Skin is warm and dry.  Neurological:     Mental Status: She is alert. She is disoriented.     Motor: Weakness present.  Psychiatric:        Behavior: Behavior normal.             Vital Signs: BP (!) 155/76 (BP Location: Right Arm)   Pulse 72   Temp 98.4 F (36.9 C)   Resp 14   Ht 5' 1 (1.549 m)   Wt 58 kg   SpO2 92%   BMI 24.16 kg/m  SpO2: SpO2: 92 % O2 Device: O2 Device: Nasal Cannula O2 Flow Rate: O2 Flow Rate (L/min): 2 L/min   Palliative Care Assessment & Plan   Assessment/Recommendation/Plan  Anticipate neurology consult today Revisit goals of care with husband tomorrow Likely plan to discharge to  short-term rehab  Thank you for allowing the Palliative Medicine Team to assist in the care of this patient.  Visit includes: Detailed review of medical records (labs, imaging, vital signs), medically appropriate exam (mental status, respiratory, cardiac, skin), discussed with treatment team, counseling and educating patient, family and staff, documenting clinical information, medication management and coordination of care.  Waddell Lesches, DNP, AGNP-C Palliative Medicine   Please contact Palliative Medicine Team phone at [phone] for questions and concerns.

## 2024-08-01 NOTE — Progress Notes (Signed)
 NAME:  CHANCI OJALA, MRN:  [id number], DOB:  October 09, 1926, LOS: 6 ADMISSION DATE:  07/26/2024, CONSULTATION DATE:  07/27/2024 REFERRING MD:  Leita Blanch MD, CHIEF COMPLAINT:  CAP   History of Present Illness:  Ms. Guin is a 89 year old female patient with a past medical history of coronary artery disease, end-stage renal disease on HD Tuesday Thursday Saturday, hypertension, dyslipidemia, hypothyroidism, type 2 diabetes mellitus who presented to the emergency department on 07/26/2024 with fatigue and generalized weakness.  She was found to be encephalopathic.  Apparently last known well the morning of her presentation then underwent HD and after HD she was noted to be more lethargic.  EMS were called and and found her to be hypotensive and therefore she was brought in to the emergency department.  She was found to be slightly hypotensive and responded to fluids.  She was also found to be hypoxic and was placed on 10 L nasal cannula.  Labs were notable for an elevated white count of 34.4 with neutrophilic predominance at 85%.  Platelets were elevated at 1000.  Lactic acid of 3.4.  Electrolytes were within normal range.  Creatinine was 5.46 mg/dL but she is a hemodialysis patient.  CT chest on 11/1 with multifocal large consolidation greatest in the left lower lobe and lingula.  She was admitted and started on ceftriaxone  and azithromycin .  Pulmonary team is being consulted for help with further management.   07/30/24- patient has improved quite a bit, she's only on 2-3L/min.  After stopping her gabapentin , requip , solucortef, benadryl  she is awake and interactive she was able to tell me correct year & month but was unsure if she's is at [facility] in [place] vs [facility] which is imprressive. Her CXR shows moderate left effusion I have placed IR consult for thoracentesis.  She is close to baseline.    08/01/24- patient seen with family at bedside.  Her breathing is stable.  She had cardio evaluation  and smiles during interview.  WBC count is improved.  She remains on zosyn.       IMAGING   Narrative & Impression  EXAM: CT CHEST, ABDOMEN AND PELVIS WITHOUT CONTRAST 03/05/2023 09:23:51 PM   TECHNIQUE: CT of the chest, abdomen and pelvis was performed without the administration of intravenous contrast. Multiplanar reformatted images are provided for review. Automated exposure control, iterative reconstruction, and/or weight based adjustment of the mA/kV was utilized to reduce the radiation dose to as low as reasonably achievable.   COMPARISON: None available.   CLINICAL HISTORY: Sepsis.   FINDINGS:   CHEST:   MEDIASTINUM AND LYMPH NODES: Mild cardiomegaly. Diffuse coronary artery and aortic atherosclerosis. The central airways are clear. No mediastinal, hilar or axillary lymphadenopathy.   LUNGS AND PLEURA: Consolidation in the left lower lobe and lingula and to a lesser extent right lower lobe compatible with pneumonia. No pleural effusion or pneumothorax.   ABDOMEN AND PELVIS:   LIVER: The liver is unremarkable.   GALLBLADDER AND BILE DUCTS: Small layering gallstones within the gallbladder. No evidence of acute cholecystitis. No biliary ductal dilatation.   SPLEEN: No acute abnormality.   PANCREAS: No acute abnormality.   ADRENAL GLANDS: No acute abnormality.   KIDNEYS, URETERS AND BLADDER: Atrophic kidneys bilaterally. Left renal mid pole cyst measures 3.5 cm. Per consensus, no follow-up is needed for simple Bosniak type 1 and 2 renal cysts, unless the patient has a malignancy history or risk factors. The urinary bladder is decompressed. No stones in the kidneys or ureters.  No hydronephrosis. No perinephric or periureteral stranding.   GI AND BOWEL: Stomach demonstrates no acute abnormality. Postoperative changes in the rectosigmoid colon. Scattered colonic diverticulosis, most pronounced in the left colon. There is no bowel obstruction.    REPRODUCTIVE ORGANS: Prior hysterectomy.   PERITONEUM AND RETROPERITONEUM: No ascites. No free air.   VASCULATURE: Aorta is normal in caliber.   ABDOMINAL AND PELVIS LYMPH NODES: No lymphadenopathy.   BONES AND SOFT TISSUES: Diffuse degenerative disc and facet disease throughout the thoracolumbar spine. No focal soft tissue abnormality.   IMPRESSION: 1. Multifocal consolidation greatest in the left lower lobe and lingula, compatible with pneumonia. 2. Cholelithiasis without acute cholecystitis. 3. Bilateral renal atrophy. 4. Colonic diverticulosis. 5. Aortic atherosclerosis. Coronary artery disease.   Electronically signed by: Franky Crease MD 07/26/2024 09:37 PM EDT RP Workstation: [id number]   Objective    Blood pressure (!) 155/76, pulse 72, temperature 98.4 F (36.9 C), resp. rate 14, height 5' 1 (1.549 m), weight 58 kg, SpO2 92%.        Intake/Output Summary (Last 24 hours) at 08/01/2024 1616 Last data filed at 08/01/2024 0100 Gross per 24 hour  Intake 100 ml  Output --  Net 100 ml   Filed Weights   07/29/24 1140 07/31/24 0851 07/31/24 1226  Weight: 57.4 kg 58.7 kg 58 kg    Examination: General: NAD.  HENT: Supple neck, reactive pupils Lungs: Diminished air entry over the left hemithorax.  Cardiovascular: Normal S1, Normal S2, RRR Abdomen: Soft, non tender, non distended  Extremities: Warm, no edema.   Labs and imaging were reviewed.  Assessment and Plan  Ms. Lottes is a 89 year old female patient with a past medical history of coronary artery disease, end-stage renal disease on HD Tuesday Thursday Saturday, hypertension, dyslipidemia, hypothyroidism, type 2 diabetes mellitus who presented to the emergency department on 07/26/2024 with fatigue and generalized weakness.  She was found to have a large left lower and lingular consolidation.  # Sepsis -PRESENT ON ADMISSION - DUE TO PNEUMONIA- on zosyn now improved with trnding down wbc count #Bilateral  pleural effusions - IR US  thoracentesis not able to aspirate due to low vol effusion  # Multifocal pneumonia with large consolidation in the left lowe lobe and lingula. -IMPROVED  # ESRD on HD TTS  Plan [] -DC SOLUCORTEF, DC BENADRYL , DC GABAPENTIN , DC REQUIP  CONTINUE ZITHROMAX  AND ZOSYN [] CRP TREND TO CONFIRM NON FLUID RELATED CHANGES []  Titrate for SpO2 > 90%.  -START CHEST PT WITH INCENTIVE SPIROMETRY FLUTTER VALVE AND PT/OT WHEN ABLE   Halina Picking, MD 08/01/2024 4:16 PM

## 2024-08-01 NOTE — Evaluation (Signed)
 Occupational Therapy Treatment Patient Details Name: Cassandra [name] MRN: [id number] DOB: 12-16-1926 Today's Date: 08/01/2024   History of present illness Cassandra [name] is a 89 y.o. female with medical history significant for coronary artery disease, end-stage renal disease on hemodialysis on TTS, hypertension, dyslipidemia, hypothyroidism, lumbar stenosis, RLS and type 2 diabetes mellitus. Pt presented to ED on 07/26/24 with acute onset of fatigue and generalized weakness with altered mental status and lethargy.  07/30/24 Brain MRI: Multiple punctate foci of acute infarction in the posterior right temporal lobe, right parietal lobe adjacent to the atrium of the right lateral ventricle, and high posterior right parietal cortex.   OT comments  Cassandra [name] was seen for OT re-evaluation following acute CVAs. Upon arrival to room pt in bed, declines out of bed but agreeable to linen change with encouragement. Pt repeatedly declines full participation in reassessment however no unilateral weakness appreciated. MIN A sup<>sit, assist to initiate as pt repeatedly states she is too cold to sit up. CGA lateral scoot along EOB. MIN A rolling bed level for clean linen change, MAX A pericare. Goals updated, will continue to follow POC. Discharge recommendation remains appropriate.       If plan is discharge home, recommend the following:  A lot of help with walking and/or transfers;A lot of help with bathing/dressing/bathroom;Assistance with feeding;Direct supervision/assist for medications management   Equipment Recommendations  Other (comment) (defer)    Recommendations for Other Services      Precautions / Restrictions Precautions Precautions: Fall Recall of Precautions/Restrictions: Impaired Restrictions Weight Bearing Restrictions Per Provider Order: No       Mobility Bed Mobility Overal bed mobility: Needs Assistance Bed Mobility: Rolling, Supine to Sit, Sit to Supine Rolling: Min  assist   Supine to sit: Min assist, +2 for physical assistance (from flat bed) Sit to supine: Min assist   General bed mobility comments: repeatedly initiates return to bed with poor eccentric control    Transfers Overall transfer level: Needs assistance   Transfers: Bed to chair/wheelchair/BSC            Lateral/Scoot Transfers: Contact guard assist       Balance Overall balance assessment: Needs assistance Sitting-balance support: Feet unsupported, Bilateral upper extremity supported Sitting balance-Leahy Scale: Fair                                     ADL either performed or assessed with clinical judgement   ADL Overall ADL's : Needs assistance/impaired                                       General ADL Comments: MAX A pericare at bed level     Communication Communication Communication: Impaired Factors Affecting Communication: Hearing impaired   Cognition Arousal: Alert Behavior During Therapy: Agitated Cognition: No family/caregiver present to determine baseline             OT - Cognition Comments: oriented to self, location, and time. Disgruntled with all mobility attempts                 Following commands: Impaired Following commands impaired: Follows one step commands inconsistently      Cueing   Cueing Techniques: Verbal cues, Tactile cues  Exercises      Shoulder Instructions       General  Comments      Pertinent Vitals/ Pain       Pain Assessment Pain Assessment: PAINAD Breathing: normal Negative Vocalization: occasional moan/groan, low speech, negative/disapproving quality Facial Expression: sad, frightened, frown Body Language: relaxed Consolability: distracted or reassured by voice/touch PAINAD Score: 3 Pain Location: bottom with pericare Pain Descriptors / Indicators: Aching Pain Intervention(s): Limited activity within patient's tolerance, Repositioned   Frequency  Min 2X/week         Progress Toward Goals  OT Goals(current goals can now be found in the care plan section)  Progress towards OT goals: Goals updated  Acute Rehab OT Goals Patient Stated Goal: to go home OT Goal Formulation: With patient Time For Goal Achievement: 08/15/24 Potential to Achieve Goals: Fair ADL Goals Pt Will Perform Grooming: with supervision;sitting (tolerate >10 min) Pt Will Perform Lower Body Dressing: with min assist;with adaptive equipment;with caregiver independent in assisting;sit to/from stand Pt Will Transfer to Toilet: with contact guard assist;ambulating;bedside commode  Plan      Co-evaluation    PT/OT/SLP Co-Evaluation/Treatment: Yes Reason for Co-Treatment: To address functional/ADL transfers;For patient/therapist safety;Necessary to address cognition/behavior during functional activity PT goals addressed during session: Mobility/safety with mobility OT goals addressed during session: ADL's and self-care      AM-PAC OT 6 Clicks Daily Activity     Outcome Measure   Help from another person eating meals?: A Little Help from another person taking care of personal grooming?: A Little Help from another person toileting, which includes using toliet, bedpan, or urinal?: A Lot Help from another person bathing (including washing, rinsing, drying)?: A Lot Help from another person to put on and taking off regular upper body clothing?: A Little Help from another person to put on and taking off regular lower body clothing?: A Lot 6 Click Score: 15    End of Session Equipment Utilized During Treatment: Oxygen  OT Visit Diagnosis: Unsteadiness on feet (R26.81)   Activity Tolerance Treatment limited secondary to agitation   Patient Left in bed;with call bell/phone within reach;with bed alarm set   Nurse Communication Mobility status        Time: 8679-8658 OT Time Calculation (min): 21 min  Charges: OT General Charges $OT Visit: 1 Visit OT  Evaluation $OT Re-eval: 1 Re-eval  Elston Slot, M.S. OTR/L  08/01/24, 2:00 PM  ascom [phone]

## 2024-08-01 NOTE — Progress Notes (Signed)
 Notified husband of pt transfer to room 119, pt agrees with transfer.

## 2024-08-01 NOTE — Progress Notes (Addendum)
 SLP Cancellation Note  Patient Details Name: Cassandra [name] MRN: [id number] DOB: July 27, 1927   Cancelled treatment:       Reason Eval/Treat Not Completed: Other (comment)  Orders received as part of stroke order set for cognitive evaluation. Per chart review, pt has been intermittently altered since admission. Chatted with neurologist who has not seen pt, asked to defer cognition assessment until next day. Of note, Palliative care noted increased confusion yesterday Patient's mentation is significantly different from yesterday.  She is oriented to self but I am concerned that I found the patient trying to eat out of a roll of tape and did not know it until I switched it out for a Magic cup. Suggested potential urine analysis to neurology.  Addendum: 8483: per neurologist, can cancel cognition consult  [name] B. Rubbie, M.S., CCC-SLP, CBIS Speech-Language Pathologist Certified Brain Injury Specialist [facility] [phone] Ascom [phone] Fax [phone]  [name] 08/01/2024, 12:05 PM

## 2024-08-01 NOTE — TOC Progression Note (Addendum)
 Transition of Care [facility]) - Progression Note    Patient Details  Name: Cassandra [name] MRN: [id number] Date of Birth: April 05, 1927  Transition of Care [facility]) CM/SW Contact  Corean ONEIDA Haddock, RN Phone Number: 08/01/2024, 11:37 AM  Clinical Narrative:     Met with patient and spouse at bedside Reviewed CMS Medicare.gov Compare Post Acute Care list reviewed with husband due to patients mental status.  He accepts bed at [facility].  Accepted in HUB  Update:  followed up with patient.  She states she is in agreement to SNF, but doesn't want to go today  Per MD anticipated patient will medically ready tomorrow.  Per Ashley at [facility] they can Accept on Monday  Ashley - [phone]     Barriers to Discharge: Continued Medical Work up               Expected Discharge Plan and Services     Post Acute Care Choice: Resumption of Svcs/PTA Provider Living arrangements for the past 2 months: Apartment                           HH Arranged: RN, PT [facility] Agency: [facility] Date [facility] Agency Contacted: 07/28/24   Representative spoke with at [facility] Agency: [place]   Social Drivers of Health (SDOH) Interventions SDOH Screenings   Food Insecurity: No Food Insecurity (07/27/2024)  Housing: Low Risk  (07/27/2024)  Transportation Needs: No Transportation Needs (07/27/2024)  Utilities: Not At Risk (07/27/2024)  Financial Resource Strain: Low Risk  (06/25/2024)   Received from [facility]  Social Connections: Moderately Integrated (07/27/2024)  Tobacco Use: Medium Risk (07/28/2024)    Readmission Risk Interventions    07/28/2024    2:52 PM 03/09/2023    4:09 PM  Readmission Risk Prevention Plan  Transportation Screening Complete Complete  PCP or Specialist Appt within 3-5 Days  Complete  HRI or Home Care Consult  Complete  Social Work Consult for Recovery Care Planning/Counseling  Complete  Palliative Care Screening  Not Applicable  Medication  Review Oceanographer) Complete Complete  PCP or Specialist appointment within 3-5 days of discharge Complete   HRI or Home Care Consult Complete   SW Recovery Care/Counseling Consult Complete   Palliative Care Screening Not Applicable   Skilled Nursing Facility Not Applicable

## 2024-08-01 NOTE — Progress Notes (Signed)
 Notified Dr Cleatus that pt has the NIH stroke scale ordered she would need to be transferred to appropriate unit.

## 2024-08-02 ENCOUNTER — Inpatient Hospital Stay

## 2024-08-02 DIAGNOSIS — A419 Sepsis, unspecified organism: Secondary | ICD-10-CM | POA: Diagnosis not present

## 2024-08-02 DIAGNOSIS — I639 Cerebral infarction, unspecified: Secondary | ICD-10-CM

## 2024-08-02 DIAGNOSIS — J189 Pneumonia, unspecified organism: Secondary | ICD-10-CM | POA: Diagnosis not present

## 2024-08-02 LAB — CBC
HCT: 38.2 % (ref 36.0–46.0)
HCT: 38.4 % (ref 36.0–46.0)
HCT: 41.5 % (ref 36.0–46.0)
Hemoglobin: 12.5 g/dL (ref 12.0–15.0)
Hemoglobin: 12.7 g/dL (ref 12.0–15.0)
Hemoglobin: 13.1 g/dL (ref 12.0–15.0)
MCH: 30.6 pg (ref 26.0–34.0)
MCH: 31.7 pg (ref 26.0–34.0)
MCH: 31.8 pg (ref 26.0–34.0)
MCHC: 31.6 g/dL (ref 30.0–36.0)
MCHC: 32.6 g/dL (ref 30.0–36.0)
MCHC: 33.2 g/dL (ref 30.0–36.0)
MCV: 95.5 fL (ref 80.0–100.0)
MCV: 97 fL (ref 80.0–100.0)
MCV: 97.5 fL (ref 80.0–100.0)
Platelets: 662 K/uL — ABNORMAL HIGH (ref 150–400)
Platelets: 682 K/uL — ABNORMAL HIGH (ref 150–400)
Platelets: 717 K/uL — ABNORMAL HIGH (ref 150–400)
RBC: 3.94 MIL/uL (ref 3.87–5.11)
RBC: 4 MIL/uL (ref 3.87–5.11)
RBC: 4.28 MIL/uL (ref 3.87–5.11)
RDW: 15.5 % (ref 11.5–15.5)
RDW: 15.6 % — ABNORMAL HIGH (ref 11.5–15.5)
RDW: 15.7 % — ABNORMAL HIGH (ref 11.5–15.5)
WBC: 21.4 K/uL — ABNORMAL HIGH (ref 4.0–10.5)
WBC: 23.1 K/uL — ABNORMAL HIGH (ref 4.0–10.5)
WBC: 23.9 K/uL — ABNORMAL HIGH (ref 4.0–10.5)
nRBC: 0 % (ref 0.0–0.2)
nRBC: 0 % (ref 0.0–0.2)
nRBC: 0 % (ref 0.0–0.2)

## 2024-08-02 LAB — BASIC METABOLIC PANEL WITH GFR
Anion gap: 20 — ABNORMAL HIGH (ref 5–15)
BUN: 52 mg/dL — ABNORMAL HIGH (ref 8–23)
CO2: 19 mmol/L — ABNORMAL LOW (ref 22–32)
Calcium: 7.8 mg/dL — ABNORMAL LOW (ref 8.9–10.3)
Chloride: 97 mmol/L — ABNORMAL LOW (ref 98–111)
Creatinine, Ser: 6 mg/dL — ABNORMAL HIGH (ref 0.44–1.00)
GFR, Estimated: 6 mL/min — ABNORMAL LOW (ref >60)
Glucose, Bld: 55 mg/dL — ABNORMAL LOW (ref 70–99)
Potassium: 4 mmol/L (ref 3.5–5.1)
Sodium: 136 mmol/L (ref 135–145)

## 2024-08-02 LAB — MAGNESIUM: Magnesium: 2.2 mg/dL (ref 1.7–2.4)

## 2024-08-02 MED ORDER — GERHARDT'S BUTT CREAM
TOPICAL_CREAM | Freq: Three times a day (TID) | CUTANEOUS | Status: DC
  Administered 2024-08-05 (×2): 1 via TOPICAL
  Filled 2024-08-02: qty 60

## 2024-08-02 MED ORDER — RISAQUAD PO CAPS
2.0000 | ORAL_CAPSULE | Freq: Two times a day (BID) | ORAL | Status: DC
  Administered 2024-08-02 – 2024-08-06 (×9): 2 via ORAL
  Filled 2024-08-02 (×9): qty 2

## 2024-08-02 MED ORDER — DIPHENHYDRAMINE HCL 50 MG/ML IJ SOLN
INTRAMUSCULAR | Status: AC
  Filled 2024-08-02: qty 1

## 2024-08-02 MED ORDER — SODIUM CHLORIDE 0.9 % IV SOLN
500.0000 mg | INTRAVENOUS | Status: DC
  Administered 2024-08-02: 500 mg via INTRAVENOUS
  Filled 2024-08-02 (×2): qty 5

## 2024-08-02 MED ORDER — DIPHENHYDRAMINE HCL 50 MG/ML IJ SOLN
12.5000 mg | Freq: Once | INTRAMUSCULAR | Status: AC
  Administered 2024-08-02: 12.5 mg via INTRAVENOUS

## 2024-08-02 MED ORDER — TRAZODONE HCL 50 MG PO TABS
50.0000 mg | ORAL_TABLET | Freq: Every evening | ORAL | Status: DC | PRN
  Administered 2024-08-02 (×2): 50 mg via ORAL
  Filled 2024-08-02 (×2): qty 1

## 2024-08-02 MED ORDER — BENZOCAINE 10 % MT GEL
Freq: Three times a day (TID) | OROMUCOSAL | Status: DC | PRN
  Filled 2024-08-02: qty 9

## 2024-08-02 NOTE — Progress Notes (Signed)
 Daily Progress Note   Patient Name: Cassandra [name]       Date: 08/02/2024 DOB: [birth year]  Age: 89 y.o. MRN#: [id number] Attending Physician: Awanda City, MD Primary Care Physician: Cleotilde Oneil FALCON, MD Admit Date: 07/26/2024  Reason for Consultation/Follow-up: Establishing goals of care  HPI/Brief Hospital Review: 89 y.o. female  with past medical history of CAD, ESRD (HD - TTS), HTN, dyslipidemia, hypothyroidism, and type 2 diabetes mellitus, admitted on 07/26/2024 with fatigue, generalized weakness, and lethargy.   Upon presentation to HD on 11/1, patient was encephalopathic, more lethargic, and therefore EMS was called.  EMS arrived to outpatient HD clinic and found patient to be hypotensive and hypoxic.   Radiograph chest revealed dense left mid to lower lung airspace disease most consistent with lingular pneumonia with a small likely parapneumonic effusion.   CT of chest and abdomen revealed multifocal consolidation greatest in the left lower lobe and lingula compatible with pneumonia as well as cholelithiasis without acute cholecystitis, bilateral renal atrophy, colonic diverticulosis, aortic atherosclerosis, and coronary artery disease.   Patient is being treated for severe sepsis due to multifocal pneumonia, elevated troponin without complaint of chest pain (likely demand ischemia with sepsis), and lethargy in the setting of pneumonia.   PMT was consulted to support patient and family with goals of care discussions.  Subjective: Extensive chart review has been completed prior to meeting patient including labs, vital signs, imaging, progress notes, orders, and available advanced directive documents from current and previous encounters.    Rounded earlier this AM, Cassandra [name] off floor  in HD. No family at visitors at bedside.  Returned to bedside later in the day. Cassandra [name] at bedside, returned from HD. She reports tolerating HD without issues. Reports being tired and cold--blankets placed. No family at bedside during time of visit.  Per neurology, concern for cardioembolic stroke--Zio monitor placed last admission unable to be located. Plan to attempt Zio monitoring again at discharge. Follow up with neurology as outpatient.  Overnight, concern for dark tarry stools. Hemoglobin has remained stable, monitoring closely.  Per last TOC note, husband has decided on [facility] which can accept her on Monday.  Will attempt to connect with husband tomorrow.  PMT to continue to follow for ongoing needs and support.  Objective:  Physical Exam Constitutional:  General: She is not in acute distress.    Appearance: She is ill-appearing.  HENT:     Head: Normocephalic.     Mouth/Throat:     Mouth: Mucous membranes are dry.  Pulmonary:     Effort: Pulmonary effort is normal. No respiratory distress.  Skin:    General: Skin is warm and dry.  Neurological:     Mental Status: She is alert.     Motor: Weakness present.     Comments: Oriented to person and place  Psychiatric:        Behavior: Behavior normal.             Vital Signs: BP 131/60   Pulse 78   Temp 98.3 F (36.8 C)   Resp 20   Ht 5' 1 (1.549 m)   Wt 53.9 kg   SpO2 100%   BMI 22.45 kg/m  SpO2: SpO2: 100 % O2 Device: O2 Device: Nasal Cannula O2 Flow Rate: O2 Flow Rate (L/min): 2 L/min   Palliative Care Assessment & Plan   Assessment/Recommendation/Plan  Continue with current plan of care Anticipate d/c to STR Monday  Thank you for allowing the Palliative Medicine Team to assist in the care of this patient.  Visit includes: Detailed review of medical records (labs, imaging, vital signs), medically appropriate exam (mental status, respiratory, cardiac, skin), discussed with treatment team,  counseling and educating patient, family and staff, documenting clinical information, medication management and coordination of care.  Waddell Lesches, DNP, AGNP-C Palliative Medicine   Please contact Palliative Medicine Team phone at [phone] for questions and concerns.

## 2024-08-02 NOTE — Progress Notes (Signed)
 NAME:  Cassandra [name], MRN:  [id number], DOB:  [birth year], LOS: 7 ADMISSION DATE:  07/26/2024, CONSULTATION DATE:  07/27/2024 REFERRING MD:  Leita Blanch MD, CHIEF COMPLAINT:  CAP   History of Present Illness:  Cassandra [name] is a 89 year old female patient with a past medical history of coronary artery disease, end-stage renal disease on HD Tuesday Thursday Saturday, hypertension, dyslipidemia, hypothyroidism, type 2 diabetes mellitus who presented to the emergency department on 07/26/2024 with fatigue and generalized weakness.  She was found to be encephalopathic.  Apparently last known well the morning of her presentation then underwent HD and after HD she was noted to be more lethargic.  EMS were called and and found her to be hypotensive and therefore she was brought in to the emergency department.  She was found to be slightly hypotensive and responded to fluids.  She was also found to be hypoxic and was placed on 10 L nasal cannula.  Labs were notable for an elevated white count of 34.4 with neutrophilic predominance at 85%.  Platelets were elevated at 1000.  Lactic acid of 3.4.  Electrolytes were within normal range.  Creatinine was 5.46 mg/dL but she is a hemodialysis patient.  CT chest on 11/1 with multifocal large consolidation greatest in the left lower lobe and lingula.  She was admitted and started on ceftriaxone  and azithromycin .  Pulmonary team is being consulted for help with further management.   07/30/24- patient has improved quite a bit, she's only on 2-3L/min.  After stopping her gabapentin , requip , solucortef, benadryl  she is awake and interactive she was able to tell me correct year & month but was unsure if she's is at [facility] in [place] vs [facility] which is imprressive. Her CXR shows moderate left effusion I have placed IR consult for thoracentesis.  She is close to baseline.    08/01/24- patient seen with family at bedside.  Her breathing is stable.  She had cardio evaluation  and smiles during interview.  WBC count is improved.  She remains on zosyn.   08/02/24- patient appears stable clinically reporting no dyspnea at rest. She is on 2L/min and is on zosyn.  RN reports no issues today.  Im going to restart her IV Zithromax  and repeat her CXR today.       IMAGING   Narrative & Impression  EXAM: CT CHEST, ABDOMEN AND PELVIS WITHOUT CONTRAST 03/05/2023 09:23:51 PM   TECHNIQUE: CT of the chest, abdomen and pelvis was performed without the administration of intravenous contrast. Multiplanar reformatted images are provided for review. Automated exposure control, iterative reconstruction, and/or weight based adjustment of the mA/kV was utilized to reduce the radiation dose to as low as reasonably achievable.   COMPARISON: None available.   CLINICAL HISTORY: Sepsis.   FINDINGS:   CHEST:   MEDIASTINUM AND LYMPH NODES: Mild cardiomegaly. Diffuse coronary artery and aortic atherosclerosis. The central airways are clear. No mediastinal, hilar or axillary lymphadenopathy.   LUNGS AND PLEURA: Consolidation in the left lower lobe and lingula and to a lesser extent right lower lobe compatible with pneumonia. No pleural effusion or pneumothorax.   ABDOMEN AND PELVIS:   LIVER: The liver is unremarkable.   GALLBLADDER AND BILE DUCTS: Small layering gallstones within the gallbladder. No evidence of acute cholecystitis. No biliary ductal dilatation.   SPLEEN: No acute abnormality.   PANCREAS: No acute abnormality.   ADRENAL GLANDS: No acute abnormality.   KIDNEYS, URETERS AND BLADDER: Atrophic kidneys bilaterally. Left renal mid pole cyst measures  3.5 cm. Per consensus, no follow-up is needed for simple Bosniak type 1 and 2 renal cysts, unless the patient has a malignancy history or risk factors. The urinary bladder is decompressed. No stones in the kidneys or ureters. No hydronephrosis. No perinephric or periureteral stranding.   GI AND  BOWEL: Stomach demonstrates no acute abnormality. Postoperative changes in the rectosigmoid colon. Scattered colonic diverticulosis, most pronounced in the left colon. There is no bowel obstruction.   REPRODUCTIVE ORGANS: Prior hysterectomy.   PERITONEUM AND RETROPERITONEUM: No ascites. No free air.   VASCULATURE: Aorta is normal in caliber.   ABDOMINAL AND PELVIS LYMPH NODES: No lymphadenopathy.   BONES AND SOFT TISSUES: Diffuse degenerative disc and facet disease throughout the thoracolumbar spine. No focal soft tissue abnormality.   IMPRESSION: 1. Multifocal consolidation greatest in the left lower lobe and lingula, compatible with pneumonia.-continue zosyn/zithromax . CXR repeating today  2. Cholelithiasis without acute cholecystitis. 3. Bilateral renal atrophy. 4. Colonic diverticulosis. 5. Aortic atherosclerosis. Coronary artery disease.   Electronically signed by: Franky Crease MD 07/26/2024 09:37 PM EDT RP Workstation: [id number]   Objective    Blood pressure (!) 131/57, pulse 79, temperature 97.9 F (36.6 C), resp. rate 19, height 5' 1 (1.549 m), weight 58 kg, SpO2 (!) 83%.       No intake or output data in the 24 hours ending 08/02/24 0752  Filed Weights   07/29/24 1140 07/31/24 0851 07/31/24 1226  Weight: 57.4 kg 58.7 kg 58 kg    Examination: General: NAD.  HENT: Supple neck, reactive pupils Lungs: Diminished air entry over the left hemithorax.  Cardiovascular: Normal S1, Normal S2, RRR Abdomen: Soft, non tender, non distended  Extremities: Warm, no edema.   Labs and imaging were reviewed.  Assessment and Plan  Cassandra [name] is a 89 year old female patient with a past medical history of coronary artery disease, end-stage renal disease on HD Tuesday Thursday Saturday, hypertension, dyslipidemia, hypothyroidism, type 2 diabetes mellitus who presented to the emergency department on 07/26/2024 with fatigue and generalized weakness.  She was found to have a  large left lower and lingular consolidation.  # Sepsis -PRESENT ON ADMISSION - DUE TO PNEUMONIA- on zosyn now improved with trnding down wbc count-added zithromax  IV #Bilateral pleural effusions - IR US  thoracentesis not able to aspirate due to low vol effusion  # Multifocal pneumonia with large consolidation in the left lowe lobe and lingula. -IMPROVED  # ESRD on HD TTS  Plan [] -DC SOLUCORTEF, DC BENADRYL , DC GABAPENTIN , DC REQUIP  CONTINUE ZITHROMAX  AND ZOSYN [] CRP TREND TO CONFIRM NON FLUID RELATED CHANGES []  Titrate for SpO2 > 90%.  -START CHEST PT WITH INCENTIVE SPIROMETRY FLUTTER VALVE AND PT/OT WHEN ABLE   Halina Picking, MD 08/02/2024 7:52 AM

## 2024-08-02 NOTE — Progress Notes (Signed)
 PROGRESS NOTE    Cassandra [name]  FMW:[id number] DOB: 1927/08/31 DOA: 07/26/2024 PCP: Cleotilde Oneil FALCON, MD  119A/119A-AA  LOS: 7 days   Brief hospital course:   Assessment & Plan: Cassandra [name] is a 89 y.o. female with medical history significant for coronary artery disease, end-stage renal disease on hemodialysis on TTS, hypertension, dyslipidemia, hypothyroidism, lumbar stenosis, RLS and type 2 diabetes mellitus, who presented to the emergency room with acute onset of fatigue and generalized weakness with altered mental status and lethargy.  Blood pressure was 79/38 with EMS.  Patient admitted to cough productive of whitish sputum   Severe sepsis due to multifocal pneumonia [facility]) -- Patient came in with altered mental status, respiratory distress, elevated white count, elevated lactic acid and multifocal pneumonia.  large consolidation in the left lowe lobe and lingula.  -- Pulmonary consultation--Dr Assaker--change to zosyn,vanc and azithromycin .  Vanc since d/c'ed. --s/p IV hydrocortisone --stress dose --cont zosyn and azithro --cont chest PT  Acute strokes --more small acute strokes on MRI brain.  Likely embolic, however, has not found Afib or Aflutter.  Pt was recently discharged with cardiac monitor, however, it's not currently present on pt. --neuro consult --monitor on tele --need outpatient cardiac monitor --cont current statin - ASA 81mg  daily + plavix 75mg  daily x90 days f/b ASA 81mg  daily monotherapy after that - Outpatient referral to neurology at hospital discharge  Bilateral pleural effusions --US  thoracentesis found no safe window to tap --HD for fluid removal  ESRD on HD --iHD per nephro   Elevated troponin 2/2 demand ischemia  --No chest pain.  IV heparin  for 48 hours per dr Fernand, completed.  Cardiology consulted, no plan for further cardiac diagnostics.  CAD --cont ASA, plavix and statin   Depression - Continue Zoloft.   Dyslipidemia -continue  statin therapy.   Hypothyroidism - continue Synthroid .   Lethargy in the setting of PNA --improved after holding sedating meds    Hyperkalemia --correct with dialysis  Leukocytosis, chronic Per onc note from Nov 2024, Leukocytosis, acute on chronic. Due to Jak2 mutation positive myeloproliferative neoplasm/PV. Continue hydroxyurea  300 mg daily.  --pt not taking hydroxyurea  PTA  Diarrhea --likely due to abx.  GI path neg --start probiotics --Imodium PRN   DVT prophylaxis: Heparin  SQ Code Status: DNR  Family Communication:  Level of care: Telemetry Dispo:   The patient is from: home Anticipated d/c is to: SNF rehab Anticipated d/c date is: monday   Subjective and Interval History:  Pt complained of painful ulcers on her tongue.    Objective: Vitals:   08/02/24 1200 08/02/24 1230 08/02/24 1302 08/02/24 1557  BP: 132/71  131/60 129/74  Pulse: 82  78 78  Resp: (!) 27  20 20   Temp: 98.3 F (36.8 C)  98.3 F (36.8 C) 98.1 F (36.7 C)  TempSrc:      SpO2: 95%  100% 90%  Weight:  53.1 kg    Height:        Intake/Output Summary (Last 24 hours) at 08/02/2024 1838 Last data filed at 08/02/2024 1230 Gross per 24 hour  Intake --  Output 800 ml  Net -800 ml   Filed Weights   08/02/24 0757 08/02/24 1155 08/02/24 1230  Weight: 53.9 kg 53.1 kg 53.1 kg    Examination:   Constitutional: NAD, AAOx3 HEENT: conjunctivae and lids normal, EOMI CV: No cyanosis.   RESP: normal respiratory effort Neuro: II - XII grossly intact.   Psych: more subdued mood and affect.  Data Reviewed: I have personally reviewed labs and imaging studies  Time spent: 35 minutes  Ellouise Haber, MD [facility] If 7PM-7AM, please contact night-coverage 08/02/2024, 6:38 PM

## 2024-08-02 NOTE — Progress Notes (Signed)
  Received patient in bed to unit.   Informed consent signed and in chart.    TX duration:3:25     Transported by  Hand-off given to patient's nurse.    Access used: Left upper Arm Fistula  Access issues: none   Total UF removed: 800 ml Medication(s) given: Benadryl , Ropinirole ,  Post HD VS: wnl Post HD weight: 53.1 kg     N. Taniah Reinecke LPN [facility]

## 2024-08-02 NOTE — Plan of Care (Signed)
 Problem: Fluid Volume: Goal: Hemodynamic stability will improve Outcome: Progressing   Problem: Clinical Measurements: Goal: Diagnostic test results will improve Outcome: Progressing Goal: Signs and symptoms of infection will decrease Outcome: Progressing   Problem: Respiratory: Goal: Ability to maintain adequate ventilation will improve Outcome: Progressing   Problem: Education: Goal: Knowledge of General Education information will improve Description: Including pain rating scale, medication(s)/side effects and non-pharmacologic comfort measures Outcome: Progressing   Problem: Health Behavior/Discharge Planning: Goal: Ability to manage health-related needs will improve Outcome: Progressing   Problem: Clinical Measurements: Goal: Ability to maintain clinical measurements within normal limits will improve Outcome: Progressing Goal: Will remain free from infection Outcome: Progressing Goal: Diagnostic test results will improve Outcome: Progressing Goal: Respiratory complications will improve Outcome: Progressing Goal: Cardiovascular complication will be avoided Outcome: Progressing   Problem: Activity: Goal: Risk for activity intolerance will decrease Outcome: Progressing   Problem: Nutrition: Goal: Adequate nutrition will be maintained Outcome: Progressing   Problem: Coping: Goal: Level of anxiety will decrease Outcome: Progressing   Problem: Elimination: Goal: Will not experience complications related to bowel motility Outcome: Progressing Goal: Will not experience complications related to urinary retention Outcome: Progressing   Problem: Pain Managment: Goal: General experience of comfort will improve and/or be controlled Outcome: Progressing   Problem: Safety: Goal: Ability to remain free from injury will improve Outcome: Progressing   Problem: Skin Integrity: Goal: Risk for impaired skin integrity will decrease Outcome: Progressing   Problem:  Education: Goal: Knowledge of disease and its progression will improve Outcome: Progressing Goal: Individualized Educational Video(s) Outcome: Progressing   Problem: Fluid Volume: Goal: Compliance with measures to maintain balanced fluid volume will improve Outcome: Progressing   Problem: Health Behavior/Discharge Planning: Goal: Ability to manage health-related needs will improve Outcome: Progressing   Problem: Nutritional: Goal: Ability to make healthy dietary choices will improve Outcome: Progressing   Problem: Clinical Measurements: Goal: Complications related to the disease process, condition or treatment will be avoided or minimized Outcome: Progressing   Problem: Education: Goal: Knowledge of disease or condition will improve Outcome: Progressing Goal: Knowledge of the prescribed therapeutic regimen will improve Outcome: Progressing Goal: Individualized Educational Video(s) Outcome: Progressing   Problem: Activity: Goal: Ability to tolerate increased activity will improve Outcome: Progressing Goal: Will verbalize the importance of balancing activity with adequate rest periods Outcome: Progressing   Problem: Respiratory: Goal: Ability to maintain a clear airway will improve Outcome: Progressing Goal: Levels of oxygenation will improve Outcome: Progressing Goal: Ability to maintain adequate ventilation will improve Outcome: Progressing   Problem: Education: Goal: Ability to describe self-care measures that may prevent or decrease complications (Diabetes Survival Skills Education) will improve Outcome: Progressing Goal: Individualized Educational Video(s) Outcome: Progressing   Problem: Coping: Goal: Ability to adjust to condition or change in health will improve Outcome: Progressing   Problem: Fluid Volume: Goal: Ability to maintain a balanced intake and output will improve Outcome: Progressing   Problem: Health Behavior/Discharge Planning: Goal:  Ability to identify and utilize available resources and services will improve Outcome: Progressing Goal: Ability to manage health-related needs will improve Outcome: Progressing   Problem: Metabolic: Goal: Ability to maintain appropriate glucose levels will improve Outcome: Progressing   Problem: Nutritional: Goal: Maintenance of adequate nutrition will improve Outcome: Progressing Goal: Progress toward achieving an optimal weight will improve Outcome: Progressing   Problem: Skin Integrity: Goal: Risk for impaired skin integrity will decrease Outcome: Progressing   Problem: Tissue Perfusion: Goal: Adequacy of tissue perfusion will improve Outcome: Progressing   Problem:  Education: Goal: Knowledge of disease or condition will improve Outcome: Progressing Goal: Knowledge of secondary prevention will improve (MUST DOCUMENT ALL) Outcome: Progressing Goal: Knowledge of patient specific risk factors will improve (DELETE if not current risk factor) Outcome: Progressing

## 2024-08-02 NOTE — Progress Notes (Signed)
 [facility]  ROUNDING NOTE   Subjective:   Cassandra [name] is a 89 year old female with past medical conditions including anxiety, hypertension, RLS, neuropathy, and end-stage renal disease on hemodialysis.  Patient presents to the emergency department with weakness and confusion and has been admitted for ESRD (end stage renal disease) on dialysis (HCC) [N18.6, Z99.2] Sepsis due to pneumonia (HCC) [J18.9, A41.9] Pneumonia due to infectious organism, unspecified laterality, unspecified part of lung [J18.9] Sepsis, due to unspecified organism, unspecified whether acute organ dysfunction present [facility]) [A41.9]  Patient is known to our practice and receives dialysis at [facility] on a TTS, supervised by Dr Marcelino.    Update  Patient seen and evaluated during dialysis   HEMODIALYSIS FLOWSHEET:  Blood Flow Rate (mL/min): 399 mL/min Arterial Pressure (mmHg): -247.87 mmHg Venous Pressure (mmHg): 194.13 mmHg TMP (mmHg): -9.7 mmHg Ultrafiltration Rate (mL/min): 543 mL/min Dialysate Flow Rate (mL/min): 300 ml/min  Tolerating treatment well Requested Requip  for RLS   Objective:  Vital signs in last 24 hours:  Temp:  [97.9 F (36.6 C)-98.7 F (37.1 C)] 98.5 F (36.9 C) (11/08 0800) Pulse Rate:  [72-95] 73 (11/08 0930) Resp:  [14-24] 20 (11/08 0930) BP: (113-162)/(57-84) 122/66 (11/08 0930) SpO2:  [83 %-97 %] 94 % (11/08 0930) Weight:  [53.9 kg] 53.9 kg (11/08 0757)  Weight change:  Filed Weights   07/31/24 0851 07/31/24 1226 08/02/24 0757  Weight: 58.7 kg 58 kg 53.9 kg    Intake/Output: I/O last 3 completed shifts: In: 100 [P.O.:100] Out: -    Intake/Output this shift:  No intake/output data recorded.  Physical Exam: General: NAD  Head: Normocephalic, atraumatic. Moist oral mucosal membranes  Eyes: Anicteric  Lungs:  Diminished, [place] O2  Heart: Regular rate and rhythm  Abdomen:  Soft, nontender  Extremities: No peripheral edema.  Neurologic: Awake  and alert, oriented to self  Skin: Warm,dry, no rash  Access: Left upper aVF    Basic Metabolic Panel: Recent Labs  Lab 07/27/24 0330 07/29/24 0413 07/31/24 0411 08/01/24 0454 08/02/24 0606  NA 136 134* 138 138 136  K 4.6 5.6* 4.4 3.8 4.0  CL 97* 98 101 98 97*  CO2 22 20* 24 24 19*  GLUCOSE 65* 115* 89 110* 55*  BUN 28* 67* 67* 40* 52*  CREATININE 5.60* 7.43* 6.04* 4.46* 6.00*  CALCIUM  7.3* 7.9* 7.9* 8.4* 7.8*  MG  --   --  2.4 2.2 2.2  PHOS  --   --  3.3  --   --     Liver Function Tests: Recent Labs  Lab 07/26/24 2015  AST 40  ALT 18  ALKPHOS 61  BILITOT 1.1  PROT 7.5  ALBUMIN  3.0*   No results for input(s): LIPASE, AMYLASE in the last 168 hours. No results for input(s): AMMONIA in the last 168 hours.  CBC: Recent Labs  Lab 07/26/24 2015 07/26/24 2330 07/27/24 0330 07/28/24 0052 07/29/24 0413 07/31/24 0930 08/01/24 2340 08/02/24 0606  WBC 34.4* 37.3*   < > 29.9* 30.9* 17.8* 23.9* 21.4*  NEUTROABS 28.6* 30.4*  --   --   --   --   --   --   HGB 16.2* 13.9   < > 13.7 13.8 12.3 13.1 12.5  HCT 52.2* 45.1   < > 42.4 42.6 38.8 41.5 38.4  MCV 101.2* 102.3*   < > 99.5 97.9 98.0 97.0 97.5  PLT 1,075* 1,090*   < > 982* 973* 675* 717* 662*   < > =  values in this interval not displayed.    Cardiac Enzymes: No results for input(s): CKTOTAL, CKMB, CKMBINDEX, TROPONINI in the last 168 hours.  BNP: Invalid input(s): POCBNP  CBG: Recent Labs  Lab 07/31/24 1252  GLUCAP 76    Microbiology: Results for orders placed or performed during the hospital encounter of 07/26/24  Blood culture (routine x 2)     Status: None   Collection Time: 07/26/24  8:11 PM   Specimen: BLOOD  Result Value Ref Range Status   Specimen Description BLOOD RIGHT ANTECUBITAL  Final   Special Requests   Final    BOTTLES DRAWN AEROBIC AND ANAEROBIC Blood Culture adequate volume   Culture   Final    NO GROWTH 5 DAYS Performed at [facility], [address] [place], [place] [address]    Report Status 07/31/2024 FINAL  Final  Resp panel by RT-PCR (RSV, Flu A&B, Covid) Anterior Nasal Swab     Status: None   Collection Time: 07/26/24  8:15 PM   Specimen: Anterior Nasal Swab  Result Value Ref Range Status   SARS Coronavirus 2 by RT PCR NEGATIVE NEGATIVE Final    Comment: (NOTE) SARS-CoV-2 target nucleic acids are NOT DETECTED.  The SARS-CoV-2 RNA is generally detectable in upper respiratory specimens during the acute phase of infection. The lowest concentration of SARS-CoV-2 viral copies this assay can detect is 138 copies/mL. A negative result does not preclude SARS-Cov-2 infection and should not be used as the sole basis for treatment or other patient management decisions. A negative result may occur with  improper specimen collection/handling, submission of specimen other than nasopharyngeal swab, presence of viral mutation(s) within the areas targeted by this assay, and inadequate number of viral copies(<138 copies/mL). A negative result must be combined with clinical observations, patient history, and epidemiological information. The expected result is Negative.  Fact Sheet for Patients:  bloggercourse.com  Fact Sheet for Healthcare Providers:  seriousbroker.it  This test is no t yet approved or cleared by the [place]  FDA and  has been authorized for detection and/or diagnosis of SARS-CoV-2 by FDA under an Emergency Use Authorization (EUA). This EUA will remain  in effect (meaning this test can be used) for the duration of the COVID-19 declaration under Section 564(b)(1) of the Act, 21 U.S.C.section 360bbb-3(b)(1), unless the authorization is terminated  or revoked sooner.       Influenza A by PCR NEGATIVE NEGATIVE Final   Influenza B by PCR NEGATIVE NEGATIVE Final    Comment: (NOTE) The Xpert Xpress SARS-CoV-2/FLU/RSV plus assay is intended as an aid in the diagnosis of  influenza from Nasopharyngeal swab specimens and should not be used as a sole basis for treatment. Nasal washings and aspirates are unacceptable for Xpert Xpress SARS-CoV-2/FLU/RSV testing.  Fact Sheet for Patients: bloggercourse.com  Fact Sheet for Healthcare Providers: seriousbroker.it  This test is not yet approved or cleared by the [place]  FDA and has been authorized for detection and/or diagnosis of SARS-CoV-2 by FDA under an Emergency Use Authorization (EUA). This EUA will remain in effect (meaning this test can be used) for the duration of the COVID-19 declaration under Section 564(b)(1) of the Act, 21 U.S.C. section 360bbb-3(b)(1), unless the authorization is terminated or revoked.     Resp Syncytial Virus by PCR NEGATIVE NEGATIVE Final    Comment: (NOTE) Fact Sheet for Patients: bloggercourse.com  Fact Sheet for Healthcare Providers: seriousbroker.it  This test is not yet approved or cleared by the [place]  FDA  and has been authorized for detection and/or diagnosis of SARS-CoV-2 by FDA under an Emergency Use Authorization (EUA). This EUA will remain in effect (meaning this test can be used) for the duration of the COVID-19 declaration under Section 564(b)(1) of the Act, 21 U.S.C. section 360bbb-3(b)(1), unless the authorization is terminated or revoked.  Performed at [facility], [address] [place], [place] [address]   Blood culture (routine x 2)     Status: None   Collection Time: 07/26/24  8:16 PM   Specimen: BLOOD  Result Value Ref Range Status   Specimen Description BLOOD RIGHT ANTECUBITAL  Final   Special Requests   Final    BOTTLES DRAWN AEROBIC AND ANAEROBIC Blood Culture results may not be optimal due to an inadequate volume of blood received in culture bottles   Culture   Final    NO GROWTH 5 DAYS Performed at [facility], [address] [place], [place] [address]    Report Status 07/31/2024 FINAL  Final  MRSA Next Gen by PCR, Nasal     Status: None   Collection Time: 07/27/24  5:00 PM   Specimen: Nasal Mucosa; Nasal Swab  Result Value Ref Range Status   MRSA by PCR Next Gen NOT DETECTED NOT DETECTED Final    Comment: (NOTE) The GeneXpert MRSA Assay (FDA approved for NASAL specimens only), is one component of a comprehensive MRSA colonization surveillance program. It is not intended to diagnose MRSA infection nor to guide or monitor treatment for MRSA infections. Test performance is not FDA approved in patients less than 7 years old. Performed at [facility], [address] [place], [place] [address]   Gastrointestinal Panel by PCR , Stool     Status: None   Collection Time: 08/01/24  4:32 PM   Specimen: Stool  Result Value Ref Range Status   Campylobacter species NOT DETECTED NOT DETECTED Final   Plesimonas shigelloides NOT DETECTED NOT DETECTED Final   Salmonella species NOT DETECTED NOT DETECTED Final   Yersinia enterocolitica NOT DETECTED NOT DETECTED Final   Vibrio species NOT DETECTED NOT DETECTED Final   Vibrio cholerae NOT DETECTED NOT DETECTED Final   Enteroaggregative E coli (EAEC) NOT DETECTED NOT DETECTED Final   Enteropathogenic E coli (EPEC) NOT DETECTED NOT DETECTED Final   Enterotoxigenic E coli (ETEC) NOT DETECTED NOT DETECTED Final   Shiga like toxin producing E coli (STEC) NOT DETECTED NOT DETECTED Final   Shigella/Enteroinvasive E coli (EIEC) NOT DETECTED NOT DETECTED Final   Cryptosporidium NOT DETECTED NOT DETECTED Final   Cyclospora cayetanensis NOT DETECTED NOT DETECTED Final   Entamoeba histolytica NOT DETECTED NOT DETECTED Final   Giardia lamblia NOT DETECTED NOT DETECTED Final   Adenovirus F40/41 NOT DETECTED NOT DETECTED Final   Astrovirus NOT DETECTED NOT DETECTED Final   Norovirus GI/GII NOT DETECTED NOT DETECTED Final   Rotavirus A NOT DETECTED  NOT DETECTED Final   Sapovirus (I, II, IV, and V) NOT DETECTED NOT DETECTED Final    Comment: Performed at [facility], [address] [place], [place] [address]    Coagulation Studies: No results for input(s): LABPROT, INR in the last 72 hours.   Urinalysis: No results for input(s): COLORURINE, LABSPEC, PHURINE, GLUCOSEU, HGBUR, BILIRUBINUR, KETONESUR, PROTEINUR, UROBILINOGEN, NITRITE, LEUKOCYTESUR in the last 72 hours.  Invalid input(s): APPERANCEUR    Imaging: US  Venous Img Lower Bilateral (DVT) Result Date: 07/31/2024 EXAM: ULTRASOUND DUPLEX OF THE BILATERAL LOWER EXTREMITY VEINS TECHNIQUE: Duplex ultrasound  using B-mode/gray scaled imaging and Doppler spectral analysis and color flow was obtained of the deep venous structures of the bilateral lower extremity. COMPARISON: 05/15/2021 and previous. CLINICAL HISTORY: 801713 Stroke (cerebrum) (HCC) 801713 Stroke (cerebrum) (HCC). FINDINGS: LEFT: The common femoral vein, femoral vein, popliteal vein, and posterior tibial vein of the left lower extremity demonstrate normal compressibility with normal color flow and spectral analysis. RIGHT: The common femoral vein, femoral vein, popliteal vein, and posterior tibial vein of the right lower extremity demonstrate normal compressibility with normal color flow and spectral analysis. IMPRESSION: 1. No evidence of DVT. Electronically signed by: Dayne Hassell MD 07/31/2024 03:58 PM EST RP Workstation: [id number]     Medications:    piperacillin-tazobactam (ZOSYN)  IV 2.25 g (08/02/24 0548)     stroke: early stages of recovery book   Does not apply Once   aspirin  EC  81 mg Oral Daily   calcium  acetate  1,334 mg Oral TID WC   Chlorhexidine  Gluconate Cloth  6 each Topical Q0600   clopidogrel  75 mg Oral Daily   feeding supplement (NEPRO CARB STEADY)  237 mL Oral BID BM   Gerhardt's butt cream   Topical TID   guaiFENesin  600 mg Oral BID   heparin  injection  (subcutaneous)  5,000 Units Subcutaneous Q8H   levothyroxine   88 mcg Oral Q0600   rOPINIRole   0.5 mg Oral BID   rosuvastatin   10 mg Oral Daily   sertraline  50 mg Oral Daily   acetaminophen  **OR** acetaminophen , heparin , hydrALAZINE , ipratropium-albuterol , lidocaine , lidocaine -prilocaine , loperamide, magnesium hydroxide, ondansetron  **OR** ondansetron  (ZOFRAN ) IV, pentafluoroprop-tetrafluoroeth, traZODone   Assessment/ Plan:  Cassandra [name] is a 89 y.o.  female with past medical conditions including anxiety, hypertension, RLS, neuropathy, and end-stage renal disease on hemodialysis.  Patient presents to the emergency department with weakness and confusion and has been admitted for ESRD (end stage renal disease) on dialysis (HCC) [N18.6, Z99.2] Sepsis due to pneumonia (HCC) [J18.9, A41.9] Pneumonia due to infectious organism, unspecified laterality, unspecified part of lung [J18.9] Sepsis, due to unspecified organism, unspecified whether acute organ dysfunction present (HCC) [A41.9]  [facility]/TTS/left aVF   End-stage renal disease on hemodialysis.  Tolerating treatment well, UF goal 0.5-1L as tolerated. Next treatment scheduled for Tuesday.   2. Anemia of chronic kidney disease Lab Results  Component Value Date   HGB 12.5 08/02/2024    Hemoglobin within optimal target  3. Secondary Hyperparathyroidism: with outpatient labs: None available Lab Results  Component Value Date   CALCIUM  7.8 (L) 08/02/2024   CAION 1.11 (L) 05/25/2023   PHOS 3.3 07/31/2024   Calcium  and phosphorus levels acceptable for this patient.  Continue calcium  acetate with meals.  4. Hypertension with chronic kidney disease. Home regimen includes amlodipine  and clonidine . Blood pressure stable during dialysis   LOS: 7 Alece Koppel 11/8/20259:55 AM

## 2024-08-02 NOTE — Consult Note (Signed)
 WOC Nurse Consult Note: Reason for Consult: perineal and labial wounds Wound type: 1.  Intertriginous dermatitis perineum/inner thighs/labia partial thickness skin loss pink moist  ICD-10 CM Codes for Irritant Dermatitis  L30.4  - Erythema intertrigo. Also used for abrasion of the hand, chafing of the skin, dermatitis due to sweating and friction, friction dermatitis, friction eczema, and genital/thigh intertrigo.  2.  Full thickness labia pink moist some areas of tan  Pressure Injury POA: NA not pressure  Measurement: see nursing flowsheet  Wound bed: as above  Drainage (amount, consistency, odor) see nursing flowsheet  Periwound: Dressing procedure/placement/frequency: Cleanse perineum and labia with Vashe wound cleanser Soila [phone]) do not rinse and allow to air dry. Apply Gerhardt's Butt Cream 3 times a day and prn soiling.   POC discussed with bedside nurse. WOC team will not follow. Re-consult if further needs arise.   Thank you    Powell Bar MSN, RN-BC, CWOCN

## 2024-08-03 DIAGNOSIS — J189 Pneumonia, unspecified organism: Secondary | ICD-10-CM | POA: Diagnosis not present

## 2024-08-03 DIAGNOSIS — A419 Sepsis, unspecified organism: Secondary | ICD-10-CM | POA: Diagnosis not present

## 2024-08-03 MED ORDER — CIPROFLOXACIN HCL 500 MG PO TABS
250.0000 mg | ORAL_TABLET | Freq: Two times a day (BID) | ORAL | Status: AC
  Administered 2024-08-03 – 2024-08-05 (×6): 250 mg via ORAL
  Filled 2024-08-03 (×6): qty 1

## 2024-08-03 NOTE — Progress Notes (Signed)
 NAME:  Cassandra [name], MRN:  [id number], DOB:  1927/07/12, LOS: 8 ADMISSION DATE:  07/26/2024, CONSULTATION DATE:  07/27/2024 REFERRING MD:  Leita Blanch MD, CHIEF COMPLAINT:  CAP   History of Present Illness:  Cassandra [name] is a 89 year old female patient with a past medical history of coronary artery disease, end-stage renal disease on HD Tuesday Thursday Saturday, hypertension, dyslipidemia, hypothyroidism, type 2 diabetes mellitus who presented to the emergency department on 07/26/2024 with fatigue and generalized weakness.  Cassandra [name] was found to be encephalopathic.  Apparently last known well the morning of Cassandra [name] presentation then underwent HD and after HD Cassandra [name] was noted to be more lethargic.  EMS were called and and found Cassandra [name] to be hypotensive and therefore Cassandra [name] was brought in to the emergency department.  Cassandra [name] was found to be slightly hypotensive and responded to fluids.  Cassandra [name] was also found to be hypoxic and was placed on 10 L nasal cannula.  Labs were notable for an elevated white count of 34.4 with neutrophilic predominance at 85%.  Platelets were elevated at 1000.  Lactic acid of 3.4.  Electrolytes were within normal range.  Creatinine was 5.46 mg/dL but Cassandra [name] is a hemodialysis patient.  CT chest on 11/1 with multifocal large consolidation greatest in the left lower lobe and lingula.  Cassandra [name] was admitted and started on ceftriaxone  and azithromycin .  Pulmonary team is being consulted for help with further management.   07/30/24- patient has improved quite a bit, Cassandra [name]'s only on 2-3L/min.  After stopping Cassandra [name] gabapentin , requip , solucortef, benadryl  Cassandra [name] is awake and interactive Cassandra [name] was able to tell me correct year & month but was unsure if Cassandra [name]'s is at [facility] in [place] vs [facility] which is imprressive. Cassandra [name] CXR shows moderate left effusion I have placed IR consult for thoracentesis.  Cassandra [name] is close to baseline.    08/01/24- patient seen with family at bedside.  Cassandra [name] breathing is stable.  Cassandra [name] had cardio evaluation  and smiles during interview.  WBC count is improved.  Cassandra [name] remains on zosyn.   08/02/24- patient appears stable clinically reporting no dyspnea at rest. Cassandra [name] is on 2L/min and is on zosyn.  RN reports no issues today.  Im going to restart Cassandra [name] IV Zithromax  and repeat Cassandra [name] CXR today.    08/03/24- patient is stable on 2L/min [place].  Cassandra [name] is now on day 7 abx.  Cassandra [name] wbc count is complicated by uderlying MDS which is managed by oncology. We will wean off IV abx and continue PO regimen for 10 day course total. Ive started Cipro bid.      IMAGING   Narrative & Impression  EXAM: CT CHEST, ABDOMEN AND PELVIS WITHOUT CONTRAST 03/05/2023 09:23:51 PM   TECHNIQUE: CT of the chest, abdomen and pelvis was performed without the administration of intravenous contrast. Multiplanar reformatted images are provided for review. Automated exposure control, iterative reconstruction, and/or weight based adjustment of the mA/kV was utilized to reduce the radiation dose to as low as reasonably achievable.   COMPARISON: None available.   CLINICAL HISTORY: Sepsis.   FINDINGS:   CHEST:   MEDIASTINUM AND LYMPH NODES: Mild cardiomegaly. Diffuse coronary artery and aortic atherosclerosis. The central airways are clear. No mediastinal, hilar or axillary lymphadenopathy.   LUNGS AND PLEURA: Consolidation in the left lower lobe and lingula and to a lesser extent right lower lobe compatible with pneumonia. No pleural effusion or pneumothorax.   ABDOMEN AND PELVIS:   LIVER: The liver is unremarkable.   GALLBLADDER AND BILE  DUCTS: Small layering gallstones within the gallbladder. No evidence of acute cholecystitis. No biliary ductal dilatation.   SPLEEN: No acute abnormality.   PANCREAS: No acute abnormality.   ADRENAL GLANDS: No acute abnormality.   KIDNEYS, URETERS AND BLADDER: Atrophic kidneys bilaterally. Left renal mid pole cyst measures 3.5 cm. Per consensus, no follow-up is needed for simple Bosniak  type 1 and 2 renal cysts, unless the patient has a malignancy history or risk factors. The urinary bladder is decompressed. No stones in the kidneys or ureters. No hydronephrosis. No perinephric or periureteral stranding.   GI AND BOWEL: Stomach demonstrates no acute abnormality. Postoperative changes in the rectosigmoid colon. Scattered colonic diverticulosis, most pronounced in the left colon. There is no bowel obstruction.   REPRODUCTIVE ORGANS: Prior hysterectomy.   PERITONEUM AND RETROPERITONEUM: No ascites. No free air.   VASCULATURE: Aorta is normal in caliber.   ABDOMINAL AND PELVIS LYMPH NODES: No lymphadenopathy.   BONES AND SOFT TISSUES: Diffuse degenerative disc and facet disease throughout the thoracolumbar spine. No focal soft tissue abnormality.   IMPRESSION: 1. Multifocal consolidation greatest in the left lower lobe and lingula, compatible with pneumonia.-continue zosyn/zithromax . CXR repeating today  2. Cholelithiasis without acute cholecystitis. 3. Bilateral renal atrophy. 4. Colonic diverticulosis. 5. Aortic atherosclerosis. Coronary artery disease.   Electronically signed by: Franky Crease MD 07/26/2024 09:37 PM EDT RP Workstation: [id number]   Objective    Blood pressure (!) 143/57, pulse 75, temperature 98.1 F (36.7 C), resp. rate 18, height 5' 1 (1.549 m), weight 53.1 kg, SpO2 96%.        Intake/Output Summary (Last 24 hours) at 08/03/2024 0851 Last data filed at 08/02/2024 1900 Gross per 24 hour  Intake 0 ml  Output 800 ml  Net -800 ml    Filed Weights   08/02/24 0757 08/02/24 1155 08/02/24 1230  Weight: 53.9 kg 53.1 kg 53.1 kg    Examination: General: NAD.  HENT: Supple neck, reactive pupils Lungs: Diminished air entry over the left hemithorax.  Cardiovascular: Normal S1, Normal S2, RRR Abdomen: Soft, non tender, non distended  Extremities: Warm, no edema.   Labs and imaging were reviewed.  Assessment and Plan  Cassandra [name] is  a 89 year old female patient with a past medical history of coronary artery disease, end-stage renal disease on HD Tuesday Thursday Saturday, hypertension, dyslipidemia, hypothyroidism, type 2 diabetes mellitus who presented to the emergency department on 07/26/2024 with fatigue and generalized weakness.  Cassandra [name] was found to have a large left lower and lingular consolidation.  # Sepsis -PRESENT ON ADMISSION - DUE TO PNEUMONIA- on zosyn now improved with trnding down wbc count-added zithromax  IV #Bilateral pleural effusions - IR US  thoracentesis not able to aspirate due to low vol effusion  # Multifocal pneumonia with large consolidation in the left lowe lobe and lingula. -IMPROVED  # ESRD on HD TTS  Plan [] -DC SOLUCORTEF, DC BENADRYL , DC GABAPENTIN , DC REQUIP  ,DC ZITHROMAX  AND ZOSYN continue cipro bid for 3 days more to complete 10d course []  Titrate for SpO2 > 90%.  -START CHEST PT WITH INCENTIVE SPIROMETRY FLUTTER VALVE AND PT/OT WHEN ABLE   Halina Picking, MD 08/03/2024 8:51 AM

## 2024-08-03 NOTE — Plan of Care (Signed)
 Problem: Fluid Volume: Goal: Hemodynamic stability will improve 08/03/2024 0553 by Theophilus Leverne KIDD, RN Outcome: Progressing 08/02/2024 2358 by Theophilus Leverne KIDD, RN Outcome: Progressing   Problem: Clinical Measurements: Goal: Diagnostic test results will improve 08/03/2024 0553 by Theophilus Leverne KIDD, RN Outcome: Progressing 08/02/2024 2358 by Theophilus Leverne KIDD, RN Outcome: Progressing Goal: Signs and symptoms of infection will decrease 08/03/2024 0553 by Theophilus Leverne KIDD, RN Outcome: Progressing 08/02/2024 2358 by Theophilus Leverne KIDD, RN Outcome: Progressing   Problem: Respiratory: Goal: Ability to maintain adequate ventilation will improve 08/03/2024 0553 by Theophilus Leverne KIDD, RN Outcome: Progressing 08/02/2024 2358 by Theophilus Leverne KIDD, RN Outcome: Progressing   Problem: Education: Goal: Knowledge of General Education information will improve Description: Including pain rating scale, medication(s)/side effects and non-pharmacologic comfort measures 08/03/2024 0553 by Theophilus Leverne KIDD, RN Outcome: Progressing 08/02/2024 2358 by Theophilus Leverne KIDD, RN Outcome: Progressing   Problem: Health Behavior/Discharge Planning: Goal: Ability to manage health-related needs will improve 08/03/2024 0553 by Theophilus Leverne KIDD, RN Outcome: Progressing 08/02/2024 2358 by Theophilus Leverne KIDD, RN Outcome: Progressing   Problem: Clinical Measurements: Goal: Ability to maintain clinical measurements within normal limits will improve 08/03/2024 0553 by Theophilus Leverne KIDD, RN Outcome: Progressing 08/02/2024 2358 by Theophilus Leverne KIDD, RN Outcome: Progressing Goal: Will remain free from infection 08/03/2024 0553 by Theophilus Leverne KIDD, RN Outcome: Progressing 08/02/2024 2358 by Theophilus Leverne KIDD, RN Outcome: Progressing Goal: Diagnostic test results will improve 08/03/2024 0553 by Theophilus Leverne KIDD, RN Outcome: Progressing 08/02/2024 2358 by Theophilus Leverne KIDD, RN Outcome: Progressing Goal: Respiratory complications will improve 08/03/2024 0553 by Theophilus Leverne KIDD, RN Outcome: Progressing 08/02/2024 2358 by Theophilus Leverne KIDD, RN Outcome: Progressing Goal: Cardiovascular complication will be avoided 08/03/2024 0553 by Theophilus Leverne KIDD, RN Outcome: Progressing 08/02/2024 2358 by Theophilus Leverne KIDD, RN Outcome: Progressing   Problem: Activity: Goal: Risk for activity intolerance will decrease 08/03/2024 0553 by Theophilus Leverne KIDD, RN Outcome: Progressing 08/02/2024 2358 by Theophilus Leverne KIDD, RN Outcome: Progressing   Problem: Nutrition: Goal: Adequate nutrition will be maintained 08/03/2024 0553 by Theophilus Leverne KIDD, RN Outcome: Progressing 08/02/2024 2358 by Theophilus Leverne KIDD, RN Outcome: Progressing   Problem: Coping: Goal: Level of anxiety will decrease 08/03/2024 0553 by Theophilus Leverne KIDD, RN Outcome: Progressing 08/02/2024 2358 by Theophilus Leverne KIDD, RN Outcome: Progressing   Problem: Elimination: Goal: Will not experience complications related to bowel motility 08/03/2024 0553 by Theophilus Leverne KIDD, RN Outcome: Progressing 08/02/2024 2358 by Theophilus Leverne KIDD, RN Outcome: Progressing Goal: Will not experience complications related to urinary retention 08/03/2024 0553 by Theophilus Leverne KIDD, RN Outcome: Progressing 08/02/2024 2358 by Theophilus Leverne KIDD, RN Outcome: Progressing   Problem: Pain Managment: Goal: General experience of comfort will improve and/or be controlled 08/03/2024 0553 by Theophilus Leverne KIDD, RN Outcome: Progressing 08/02/2024 2358 by Theophilus Leverne KIDD, RN Outcome: Progressing   Problem: Safety: Goal: Ability to remain free from injury will improve 08/03/2024 0553 by Theophilus Leverne KIDD, RN Outcome: Progressing 08/02/2024 2358 by Theophilus Leverne KIDD, RN Outcome: Progressing   Problem: Skin Integrity: Goal: Risk for impaired skin integrity will  decrease 08/03/2024 0553 by Theophilus Leverne KIDD, RN Outcome: Progressing 08/02/2024 2358 by Theophilus Leverne KIDD, RN Outcome: Progressing   Problem: Education: Goal: Knowledge of disease and its progression will improve 08/03/2024 0553 by Theophilus Leverne KIDD, RN Outcome: Progressing 08/02/2024 2358 by Theophilus Leverne KIDD, RN Outcome: Progressing Goal: Individualized Educational Video(s) 08/03/2024 0553 by Theophilus Leverne KIDD, RN Outcome: Progressing 08/02/2024 2358 by  Theophilus Leverne KIDD, RN Outcome: Progressing   Problem: Fluid Volume: Goal: Compliance with measures to maintain balanced fluid volume will improve 08/03/2024 0553 by Theophilus Leverne KIDD, RN Outcome: Progressing 08/02/2024 2358 by Theophilus Leverne KIDD, RN Outcome: Progressing   Problem: Health Behavior/Discharge Planning: Goal: Ability to manage health-related needs will improve 08/03/2024 0553 by Theophilus Leverne KIDD, RN Outcome: Progressing 08/02/2024 2358 by Theophilus Leverne KIDD, RN Outcome: Progressing   Problem: Nutritional: Goal: Ability to make healthy dietary choices will improve 08/03/2024 0553 by Theophilus Leverne KIDD, RN Outcome: Progressing 08/02/2024 2358 by Theophilus Leverne KIDD, RN Outcome: Progressing   Problem: Clinical Measurements: Goal: Complications related to the disease process, condition or treatment will be avoided or minimized 08/03/2024 0553 by Theophilus Leverne KIDD, RN Outcome: Progressing 08/02/2024 2358 by Theophilus Leverne KIDD, RN Outcome: Progressing   Problem: Education: Goal: Knowledge of disease or condition will improve 08/03/2024 0553 by Theophilus Leverne KIDD, RN Outcome: Progressing 08/02/2024 2358 by Theophilus Leverne KIDD, RN Outcome: Progressing Goal: Knowledge of the prescribed therapeutic regimen will improve 08/03/2024 0553 by Theophilus Leverne KIDD, RN Outcome: Progressing 08/02/2024 2358 by Theophilus Leverne KIDD, RN Outcome: Progressing Goal: Individualized Educational  Video(s) 08/03/2024 0553 by Theophilus Leverne KIDD, RN Outcome: Progressing 08/02/2024 2358 by Theophilus Leverne KIDD, RN Outcome: Progressing   Problem: Activity: Goal: Ability to tolerate increased activity will improve 08/03/2024 0553 by Theophilus Leverne KIDD, RN Outcome: Progressing 08/02/2024 2358 by Theophilus Leverne KIDD, RN Outcome: Progressing Goal: Will verbalize the importance of balancing activity with adequate rest periods 08/03/2024 0553 by Theophilus Leverne KIDD, RN Outcome: Progressing 08/02/2024 2358 by Theophilus Leverne KIDD, RN Outcome: Progressing   Problem: Respiratory: Goal: Ability to maintain a clear airway will improve 08/03/2024 0553 by Theophilus Leverne KIDD, RN Outcome: Progressing 08/02/2024 2358 by Theophilus Leverne KIDD, RN Outcome: Progressing Goal: Levels of oxygenation will improve 08/03/2024 0553 by Theophilus Leverne KIDD, RN Outcome: Progressing 08/02/2024 2358 by Theophilus Leverne KIDD, RN Outcome: Progressing Goal: Ability to maintain adequate ventilation will improve 08/03/2024 0553 by Theophilus Leverne KIDD, RN Outcome: Progressing 08/02/2024 2358 by Theophilus Leverne KIDD, RN Outcome: Progressing   Problem: Education: Goal: Ability to describe self-care measures that may prevent or decrease complications (Diabetes Survival Skills Education) will improve 08/03/2024 0553 by Theophilus Leverne KIDD, RN Outcome: Progressing 08/02/2024 2358 by Theophilus Leverne KIDD, RN Outcome: Progressing Goal: Individualized Educational Video(s) 08/03/2024 0553 by Theophilus Leverne KIDD, RN Outcome: Progressing 08/02/2024 2358 by Theophilus Leverne KIDD, RN Outcome: Progressing   Problem: Coping: Goal: Ability to adjust to condition or change in health will improve 08/03/2024 0553 by Theophilus Leverne KIDD, RN Outcome: Progressing 08/02/2024 2358 by Theophilus Leverne KIDD, RN Outcome: Progressing   Problem: Fluid Volume: Goal: Ability to maintain a balanced intake and output will  improve 08/03/2024 0553 by Theophilus Leverne KIDD, RN Outcome: Progressing 08/02/2024 2358 by Theophilus Leverne KIDD, RN Outcome: Progressing   Problem: Health Behavior/Discharge Planning: Goal: Ability to identify and utilize available resources and services will improve 08/03/2024 0553 by Theophilus Leverne KIDD, RN Outcome: Progressing 08/02/2024 2358 by Theophilus Leverne KIDD, RN Outcome: Progressing Goal: Ability to manage health-related needs will improve 08/03/2024 0553 by Theophilus Leverne KIDD, RN Outcome: Progressing 08/02/2024 2358 by Theophilus Leverne KIDD, RN Outcome: Progressing   Problem: Metabolic: Goal: Ability to maintain appropriate glucose levels will improve 08/03/2024 0553 by Theophilus Leverne KIDD, RN Outcome: Progressing 08/02/2024 2358 by Theophilus Leverne KIDD, RN Outcome: Progressing   Problem: Nutritional: Goal: Maintenance of adequate nutrition will improve 08/03/2024 0553 by  Theophilus Leverne KIDD, RN Outcome: Progressing 08/02/2024 2358 by Theophilus Leverne KIDD, RN Outcome: Progressing Goal: Progress toward achieving an optimal weight will improve 08/03/2024 0553 by Theophilus Leverne KIDD, RN Outcome: Progressing 08/02/2024 2358 by Theophilus Leverne KIDD, RN Outcome: Progressing   Problem: Skin Integrity: Goal: Risk for impaired skin integrity will decrease 08/03/2024 0553 by Theophilus Leverne KIDD, RN Outcome: Progressing 08/02/2024 2358 by Theophilus Leverne KIDD, RN Outcome: Progressing   Problem: Tissue Perfusion: Goal: Adequacy of tissue perfusion will improve 08/03/2024 0553 by Theophilus Leverne KIDD, RN Outcome: Progressing 08/02/2024 2358 by Theophilus Leverne KIDD, RN Outcome: Progressing   Problem: Education: Goal: Knowledge of disease or condition will improve 08/03/2024 0553 by Theophilus Leverne KIDD, RN Outcome: Progressing 08/02/2024 2358 by Theophilus Leverne KIDD, RN Outcome: Progressing Goal: Knowledge of secondary prevention will improve (MUST DOCUMENT ALL) 08/03/2024  0553 by Theophilus Leverne KIDD, RN Outcome: Progressing 08/02/2024 2358 by Theophilus Leverne KIDD, RN Outcome: Progressing Goal: Knowledge of patient specific risk factors will improve (DELETE if not current risk factor) 08/03/2024 0553 by Theophilus Leverne KIDD, RN Outcome: Progressing 08/02/2024 2358 by Theophilus Leverne KIDD, RN Outcome: Progressing

## 2024-08-03 NOTE — Plan of Care (Signed)
 Problem: Fluid Volume: Goal: Hemodynamic stability will improve Outcome: Progressing   Problem: Clinical Measurements: Goal: Diagnostic test results will improve Outcome: Progressing Goal: Signs and symptoms of infection will decrease Outcome: Progressing   Problem: Respiratory: Goal: Ability to maintain adequate ventilation will improve Outcome: Progressing   Problem: Education: Goal: Knowledge of General Education information will improve Description: Including pain rating scale, medication(s)/side effects and non-pharmacologic comfort measures Outcome: Progressing   Problem: Health Behavior/Discharge Planning: Goal: Ability to manage health-related needs will improve Outcome: Progressing   Problem: Clinical Measurements: Goal: Ability to maintain clinical measurements within normal limits will improve Outcome: Progressing Goal: Will remain free from infection Outcome: Progressing Goal: Diagnostic test results will improve Outcome: Progressing Goal: Respiratory complications will improve Outcome: Progressing Goal: Cardiovascular complication will be avoided Outcome: Progressing   Problem: Activity: Goal: Risk for activity intolerance will decrease Outcome: Progressing   Problem: Nutrition: Goal: Adequate nutrition will be maintained Outcome: Progressing   Problem: Coping: Goal: Level of anxiety will decrease Outcome: Progressing   Problem: Elimination: Goal: Will not experience complications related to bowel motility Outcome: Progressing Goal: Will not experience complications related to urinary retention Outcome: Progressing   Problem: Pain Managment: Goal: General experience of comfort will improve and/or be controlled Outcome: Progressing   Problem: Safety: Goal: Ability to remain free from injury will improve Outcome: Progressing   Problem: Skin Integrity: Goal: Risk for impaired skin integrity will decrease Outcome: Progressing   Problem:  Education: Goal: Knowledge of disease and its progression will improve Outcome: Progressing Goal: Individualized Educational Video(s) Outcome: Progressing   Problem: Fluid Volume: Goal: Compliance with measures to maintain balanced fluid volume will improve Outcome: Progressing   Problem: Health Behavior/Discharge Planning: Goal: Ability to manage health-related needs will improve Outcome: Progressing   Problem: Nutritional: Goal: Ability to make healthy dietary choices will improve Outcome: Progressing   Problem: Clinical Measurements: Goal: Complications related to the disease process, condition or treatment will be avoided or minimized Outcome: Progressing   Problem: Education: Goal: Knowledge of disease or condition will improve Outcome: Progressing Goal: Knowledge of the prescribed therapeutic regimen will improve Outcome: Progressing Goal: Individualized Educational Video(s) Outcome: Progressing   Problem: Activity: Goal: Ability to tolerate increased activity will improve Outcome: Progressing Goal: Will verbalize the importance of balancing activity with adequate rest periods Outcome: Progressing   Problem: Respiratory: Goal: Ability to maintain a clear airway will improve Outcome: Progressing Goal: Levels of oxygenation will improve Outcome: Progressing Goal: Ability to maintain adequate ventilation will improve Outcome: Progressing   Problem: Education: Goal: Ability to describe self-care measures that may prevent or decrease complications (Diabetes Survival Skills Education) will improve Outcome: Progressing Goal: Individualized Educational Video(s) Outcome: Progressing   Problem: Coping: Goal: Ability to adjust to condition or change in health will improve Outcome: Progressing   Problem: Fluid Volume: Goal: Ability to maintain a balanced intake and output will improve Outcome: Progressing   Problem: Health Behavior/Discharge Planning: Goal:  Ability to identify and utilize available resources and services will improve Outcome: Progressing Goal: Ability to manage health-related needs will improve Outcome: Progressing   Problem: Metabolic: Goal: Ability to maintain appropriate glucose levels will improve Outcome: Progressing   Problem: Nutritional: Goal: Maintenance of adequate nutrition will improve Outcome: Progressing Goal: Progress toward achieving an optimal weight will improve Outcome: Progressing   Problem: Skin Integrity: Goal: Risk for impaired skin integrity will decrease Outcome: Progressing   Problem: Tissue Perfusion: Goal: Adequacy of tissue perfusion will improve Outcome: Progressing   Problem:  Education: Goal: Knowledge of disease or condition will improve Outcome: Progressing Goal: Knowledge of secondary prevention will improve (MUST DOCUMENT ALL) Outcome: Progressing Goal: Knowledge of patient specific risk factors will improve (DELETE if not current risk factor) Outcome: Progressing

## 2024-08-03 NOTE — Progress Notes (Signed)
 PROGRESS NOTE    KEYANDRA SWENSON  FMW:[id number] DOB: 17-Nov-1926 DOA: 07/26/2024 PCP: Cleotilde Oneil FALCON, MD  119A/119A-AA  LOS: 8 days   Brief hospital course:   Assessment & Plan: BELL CARBO is a 89 y.o. female with medical history significant for coronary artery disease, end-stage renal disease on hemodialysis on TTS, hypertension, dyslipidemia, hypothyroidism, lumbar stenosis, RLS and type 2 diabetes mellitus, who presented to the emergency room with acute onset of fatigue and generalized weakness with altered mental status and lethargy.  Blood pressure was 79/38 with EMS.  Patient admitted to cough productive of whitish sputum   Severe sepsis due to multifocal pneumonia [facility]) -- Patient came in with altered mental status, respiratory distress, elevated white count, elevated lactic acid and multifocal pneumonia.  large consolidation in the left lowe lobe and lingula.  -- Pulmonary consultation--Dr Assaker--change to zosyn,vanc and azithromycin .  Vanc since d/c'ed. --s/p IV hydrocortisone --stress dose --switch from zosyn and azithro to cipro today by pulm.  Acute strokes --more small acute strokes on MRI brain.  Likely embolic, however, has not found Afib or Aflutter.  Pt was recently discharged with cardiac monitor, however, it's not currently present on pt. --neuro consult --monitor on tele --cont current statin - ASA 81mg  daily + plavix 75mg  daily x90 days f/b ASA 81mg  daily monotherapy after that - Outpatient referral to neurology at hospital discharge --will discharge on cardiac monitor  Bilateral pleural effusions --US  thoracentesis found no safe window to tap --HD for fluid removal  ESRD on HD --iHD per nephro   Elevated troponin 2/2 demand ischemia  --No chest pain.  IV heparin  for 48 hours per dr Fernand, completed.  Cardiology consulted, no plan for further cardiac diagnostics.  CAD --cont ASA, plavix and statin   Depression - Continue Zoloft.    Dyslipidemia -continue statin therapy.   Hypothyroidism - continue Synthroid .   Lethargy in the setting of PNA --improved after holding sedating meds    Hyperkalemia --correct with dialysis  Leukocytosis, chronic Per onc note from Nov 2024, Leukocytosis, acute on chronic. Due to Jak2 mutation positive myeloproliferative neoplasm/PV. Continue hydroxyurea  300 mg daily.  --pt not taking hydroxyurea  PTA  Diarrhea --likely due to abx.  GI path neg --cont probiotic (new) --Imodium PRN   DVT prophylaxis: Heparin  SQ Code Status: DNR  Family Communication:  Level of care: Telemetry Dispo:   The patient is from: home Anticipated d/c is to: SNF rehab Anticipated d/c date is: monday   Subjective and Interval History:  Pt reported tongue pain improved.   Objective: Vitals:   08/02/24 2004 08/03/24 0019 08/03/24 0425 08/03/24 0909  BP: (!) 150/65 (!) 150/70 (!) 143/57 (!) 152/63  Pulse: 87 80 75 76  Resp: 20 18 18 16   Temp: 98.2 F (36.8 C) 98.3 F (36.8 C) 98.1 F (36.7 C) 98.4 F (36.9 C)  TempSrc:      SpO2: 93% 93% 96% 94%  Weight:      Height:       No intake or output data in the 24 hours ending 08/03/24 1922  Filed Weights   08/02/24 0757 08/02/24 1155 08/02/24 1230  Weight: 53.9 kg 53.1 kg 53.1 kg    Examination:   Constitutional: NAD, AAOx3 HEENT: conjunctivae and lids normal, EOMI CV: No cyanosis.   RESP: normal respiratory effort Extremities: No effusions, edema in BLE SKIN: warm, dry Neuro: II - XII grossly intact.   Psych: more subdued mood and affect.     Data  Reviewed: I have personally reviewed labs and imaging studies  Time spent: 35 minutes  Ellouise Haber, MD [facility] If 7PM-7AM, please contact night-coverage 08/03/2024, 7:22 PM

## 2024-08-03 NOTE — Progress Notes (Signed)
 Daily Progress Note   Patient Name: Cassandra [name]       Date: 08/03/2024 DOB: November 22, 1926  Age: 89 y.o. MRN#: [id number] Attending Physician: Awanda City, MD Primary Care Physician: Cleotilde Oneil FALCON, MD Admit Date: 07/26/2024  Reason for Consultation/Follow-up: Establishing goals of care  HPI/Brief Hospital Review: 89 y.o. female  with past medical history of CAD, ESRD (HD - TTS), HTN, dyslipidemia, hypothyroidism, and type 2 diabetes mellitus, admitted on 07/26/2024 with fatigue, generalized weakness, and lethargy.   Upon presentation to HD on 11/1, patient was encephalopathic, more lethargic, and therefore EMS was called.  EMS arrived to outpatient HD clinic and found patient to be hypotensive and hypoxic.   Radiograph chest revealed dense left mid to lower lung airspace disease most consistent with lingular pneumonia with a small likely parapneumonic effusion.   CT of chest and abdomen revealed multifocal consolidation greatest in the left lower lobe and lingula compatible with pneumonia as well as cholelithiasis without acute cholecystitis, bilateral renal atrophy, colonic diverticulosis, aortic atherosclerosis, and coronary artery disease.   Patient is being treated for severe sepsis due to multifocal pneumonia, elevated troponin without complaint of chest pain (likely demand ischemia with sepsis), and lethargy in the setting of pneumonia.   PMT was consulted to support patient and family with goals of care discussions.  Subjective: Extensive chart review has been completed prior to meeting patient including labs, vital signs, imaging, progress notes, orders, and available advanced directive documents from current and previous encounters.    Visited with Ms. Vigo at her bedside.  She  is awake, alert and able to engage in conversation.  Nursing staff at bedside assisting with setting up her lunch tray.  No family or visitors at bedside during time of visit.  Assessed symptoms.  Ms. Kuennen denies acute pain or discomfort, reports feeling well today and able to rest overnight.  Ms. Radford able to recall in review conversation had with the neurologist a few days prior.  She is aware she will be wearing a monitoring device at discharge for cardiac rhythm as there is concerned this contributed to her most recent strokes.  She is also aware plan for likely discharge to [facility] for short-term rehab tomorrow with her overall and goal to return home living with her husband.  Answered and addressed all questions  and concerns.  PMT to step away from daily visits but will remain available in the background.  Anticipate discharge tomorrow per latest TOC note please reengage if needs or concerns arise.  Objective:  Physical Exam Constitutional:      General: She is not in acute distress.    Appearance: She is ill-appearing.  HENT:     Head: Normocephalic.     Mouth/Throat:     Mouth: Mucous membranes are dry.  Pulmonary:     Effort: Pulmonary effort is normal. No respiratory distress.  Abdominal:     Palpations: Abdomen is soft.     Tenderness: There is no abdominal tenderness.  Musculoskeletal:        General: Normal range of motion.  Skin:    General: Skin is warm and dry.  Neurological:     Mental Status: She is alert and oriented to person, place, and time.     Motor: Weakness present.  Psychiatric:        Mood and Affect: Mood normal.        Behavior: Behavior normal.        Thought Content: Thought content normal.             Vital Signs: BP (!) 152/63 (BP Location: Right Arm)   Pulse 76   Temp 98.4 F (36.9 C)   Resp 16   Ht 5' 1 (1.549 m)   Wt 53.1 kg   SpO2 94%   BMI 22.12 kg/m  SpO2: SpO2: 94 % O2 Device: O2 Device: Nasal Cannula O2 Flow Rate: O2  Flow Rate (L/min): 2 L/min   Palliative Care Assessment & Plan   Assessment/Recommendation/Plan  Continue current plan of care Anticipate discharge to short-term rehab tomorrow  Thank you for allowing the Palliative Medicine Team to assist in the care of this patient.  Visit includes: Detailed review of medical records (labs, imaging, vital signs), medically appropriate exam (mental status, respiratory, cardiac, skin), discussed with treatment team, counseling and educating patient, family and staff, documenting clinical information, medication management and coordination of care.  Waddell Lesches, DNP, AGNP-C Palliative Medicine   Please contact Palliative Medicine Team phone at [phone] for questions and concerns.

## 2024-08-03 NOTE — Progress Notes (Signed)
 [facility]  ROUNDING NOTE   Subjective:   Cassandra [name] is a 89 year old female with past medical conditions including anxiety, hypertension, RLS, neuropathy, and end-stage renal disease on hemodialysis.  Patient presents to the emergency department with weakness and confusion and has been admitted for ESRD (end stage renal disease) on dialysis (HCC) [N18.6, Z99.2] Sepsis due to pneumonia (HCC) [J18.9, A41.9] Pneumonia due to infectious organism, unspecified laterality, unspecified part of lung [J18.9] Sepsis, due to unspecified organism, unspecified whether acute organ dysfunction present [facility]) [A41.9]  Patient is known to our practice and receives dialysis at [facility] on a TTS, supervised by Dr Marcelino.    Update  Patient seen laying in bed Breakfast tray at bedside Remains on 2L [place] Denies shortness of breath   Objective:  Vital signs in last 24 hours:  Temp:  [98.1 F (36.7 C)-98.4 F (36.9 C)] 98.4 F (36.9 C) (11/09 0909) Pulse Rate:  [75-87] 76 (11/09 0909) Resp:  [16-27] 16 (11/09 0909) BP: (127-152)/(57-74) 152/63 (11/09 0909) SpO2:  [90 %-100 %] 94 % (11/09 0909) Weight:  [53.1 kg] 53.1 kg (11/08 1230)  Weight change:  Filed Weights   08/02/24 0757 08/02/24 1155 08/02/24 1230  Weight: 53.9 kg 53.1 kg 53.1 kg    Intake/Output: I/O last 3 completed shifts: In: 0  Out: 800 [Other:800]   Intake/Output this shift:  No intake/output data recorded.  Physical Exam: General: NAD  Head: Normocephalic, atraumatic. Moist oral mucosal membranes  Eyes: Anicteric  Lungs:  Diminished, [place] O2  Heart: Regular rate and rhythm  Abdomen:  Soft, nontender  Extremities: No peripheral edema.  Neurologic: Awake and alert, oriented to self  Skin: Warm,dry, no rash  Access: Left upper aVF    Basic Metabolic Panel: Recent Labs  Lab 07/29/24 0413 07/31/24 0411 08/01/24 0454 08/02/24 0606  NA 134* 138 138 136  K 5.6* 4.4 3.8 4.0  CL 98 101 98  97*  CO2 20* 24 24 19*  GLUCOSE 115* 89 110* 55*  BUN 67* 67* 40* 52*  CREATININE 7.43* 6.04* 4.46* 6.00*  CALCIUM  7.9* 7.9* 8.4* 7.8*  MG  --  2.4 2.2 2.2  PHOS  --  3.3  --   --     Liver Function Tests: No results for input(s): AST, ALT, ALKPHOS, BILITOT, PROT, ALBUMIN  in the last 168 hours.  No results for input(s): LIPASE, AMYLASE in the last 168 hours. No results for input(s): AMMONIA in the last 168 hours.  CBC: Recent Labs  Lab 07/29/24 0413 07/31/24 0930 08/01/24 2340 08/02/24 0606 08/02/24 1328  WBC 30.9* 17.8* 23.9* 21.4* 23.1*  HGB 13.8 12.3 13.1 12.5 12.7  HCT 42.6 38.8 41.5 38.4 38.2  MCV 97.9 98.0 97.0 97.5 95.5  PLT 973* 675* 717* 662* 682*    Cardiac Enzymes: No results for input(s): CKTOTAL, CKMB, CKMBINDEX, TROPONINI in the last 168 hours.  BNP: Invalid input(s): POCBNP  CBG: Recent Labs  Lab 07/31/24 1252  GLUCAP 76    Microbiology: Results for orders placed or performed during the hospital encounter of 07/26/24  Blood culture (routine x 2)     Status: None   Collection Time: 07/26/24  8:11 PM   Specimen: BLOOD  Result Value Ref Range Status   Specimen Description BLOOD RIGHT ANTECUBITAL  Final   Special Requests   Final    BOTTLES DRAWN AEROBIC AND ANAEROBIC Blood Culture adequate volume   Culture   Final    NO GROWTH 5 DAYS Performed  at [facility], [address] [place], [place] [address]    Report Status 07/31/2024 FINAL  Final  Resp panel by RT-PCR (RSV, Flu A&B, Covid) Anterior Nasal Swab     Status: None   Collection Time: 07/26/24  8:15 PM   Specimen: Anterior Nasal Swab  Result Value Ref Range Status   SARS Coronavirus 2 by RT PCR NEGATIVE NEGATIVE Final    Comment: (NOTE) SARS-CoV-2 target nucleic acids are NOT DETECTED.  The SARS-CoV-2 RNA is generally detectable in upper respiratory specimens during the acute phase of infection. The lowest concentration of SARS-CoV-2 viral  copies this assay can detect is 138 copies/mL. A negative result does not preclude SARS-Cov-2 infection and should not be used as the sole basis for treatment or other patient management decisions. A negative result may occur with  improper specimen collection/handling, submission of specimen other than nasopharyngeal swab, presence of viral mutation(s) within the areas targeted by this assay, and inadequate number of viral copies(<138 copies/mL). A negative result must be combined with clinical observations, patient history, and epidemiological information. The expected result is Negative.  Fact Sheet for Patients:  bloggercourse.com  Fact Sheet for Healthcare Providers:  seriousbroker.it  This test is no t yet approved or cleared by the [place]  FDA and  has been authorized for detection and/or diagnosis of SARS-CoV-2 by FDA under an Emergency Use Authorization (EUA). This EUA will remain  in effect (meaning this test can be used) for the duration of the COVID-19 declaration under Section 564(b)(1) of the Act, 21 U.S.C.section 360bbb-3(b)(1), unless the authorization is terminated  or revoked sooner.       Influenza A by PCR NEGATIVE NEGATIVE Final   Influenza B by PCR NEGATIVE NEGATIVE Final    Comment: (NOTE) The Xpert Xpress SARS-CoV-2/FLU/RSV plus assay is intended as an aid in the diagnosis of influenza from Nasopharyngeal swab specimens and should not be used as a sole basis for treatment. Nasal washings and aspirates are unacceptable for Xpert Xpress SARS-CoV-2/FLU/RSV testing.  Fact Sheet for Patients: bloggercourse.com  Fact Sheet for Healthcare Providers: seriousbroker.it  This test is not yet approved or cleared by the [place]  FDA and has been authorized for detection and/or diagnosis of SARS-CoV-2 by FDA under an Emergency Use Authorization (EUA). This  EUA will remain in effect (meaning this test can be used) for the duration of the COVID-19 declaration under Section 564(b)(1) of the Act, 21 U.S.C. section 360bbb-3(b)(1), unless the authorization is terminated or revoked.     Resp Syncytial Virus by PCR NEGATIVE NEGATIVE Final    Comment: (NOTE) Fact Sheet for Patients: bloggercourse.com  Fact Sheet for Healthcare Providers: seriousbroker.it  This test is not yet approved or cleared by the [place]  FDA and has been authorized for detection and/or diagnosis of SARS-CoV-2 by FDA under an Emergency Use Authorization (EUA). This EUA will remain in effect (meaning this test can be used) for the duration of the COVID-19 declaration under Section 564(b)(1) of the Act, 21 U.S.C. section 360bbb-3(b)(1), unless the authorization is terminated or revoked.  Performed at [facility], [address] [place], [place] [address]   Blood culture (routine x 2)     Status: None   Collection Time: 07/26/24  8:16 PM   Specimen: BLOOD  Result Value Ref Range Status   Specimen Description BLOOD RIGHT ANTECUBITAL  Final   Special Requests   Final    BOTTLES DRAWN AEROBIC AND ANAEROBIC Blood Culture results may not be  optimal due to an inadequate volume of blood received in culture bottles   Culture   Final    NO GROWTH 5 DAYS Performed at [facility], [address] [place], [place] [address]    Report Status 07/31/2024 FINAL  Final  MRSA Next Gen by PCR, Nasal     Status: None   Collection Time: 07/27/24  5:00 PM   Specimen: Nasal Mucosa; Nasal Swab  Result Value Ref Range Status   MRSA by PCR Next Gen NOT DETECTED NOT DETECTED Final    Comment: (NOTE) The GeneXpert MRSA Assay (FDA approved for NASAL specimens only), is one component of a comprehensive MRSA colonization surveillance program. It is not intended to diagnose MRSA infection nor to guide or monitor  treatment for MRSA infections. Test performance is not FDA approved in patients less than 54 years old. Performed at [facility], [address] [place], [place] [address]   Gastrointestinal Panel by PCR , Stool     Status: None   Collection Time: 08/01/24  4:32 PM   Specimen: Stool  Result Value Ref Range Status   Campylobacter species NOT DETECTED NOT DETECTED Final   Plesimonas shigelloides NOT DETECTED NOT DETECTED Final   Salmonella species NOT DETECTED NOT DETECTED Final   Yersinia enterocolitica NOT DETECTED NOT DETECTED Final   Vibrio species NOT DETECTED NOT DETECTED Final   Vibrio cholerae NOT DETECTED NOT DETECTED Final   Enteroaggregative E coli (EAEC) NOT DETECTED NOT DETECTED Final   Enteropathogenic E coli (EPEC) NOT DETECTED NOT DETECTED Final   Enterotoxigenic E coli (ETEC) NOT DETECTED NOT DETECTED Final   Shiga like toxin producing E coli (STEC) NOT DETECTED NOT DETECTED Final   Shigella/Enteroinvasive E coli (EIEC) NOT DETECTED NOT DETECTED Final   Cryptosporidium NOT DETECTED NOT DETECTED Final   Cyclospora cayetanensis NOT DETECTED NOT DETECTED Final   Entamoeba histolytica NOT DETECTED NOT DETECTED Final   Giardia lamblia NOT DETECTED NOT DETECTED Final   Adenovirus F40/41 NOT DETECTED NOT DETECTED Final   Astrovirus NOT DETECTED NOT DETECTED Final   Norovirus GI/GII NOT DETECTED NOT DETECTED Final   Rotavirus A NOT DETECTED NOT DETECTED Final   Sapovirus (I, II, IV, and V) NOT DETECTED NOT DETECTED Final    Comment: Performed at [facility], [address] [place], [place] [address]    Coagulation Studies: No results for input(s): LABPROT, INR in the last 72 hours.   Urinalysis: No results for input(s): COLORURINE, LABSPEC, PHURINE, GLUCOSEU, HGBUR, BILIRUBINUR, KETONESUR, PROTEINUR, UROBILINOGEN, NITRITE, LEUKOCYTESUR in the last 72 hours.  Invalid input(s): APPERANCEUR    Imaging: DG Chest Port 1  View Result Date: 08/02/2024 EXAM: 1 VIEW(S) XRAY OF THE CHEST 08/02/2024 05:41:00 PM COMPARISON: 07/30/2024 CLINICAL HISTORY: Pneumonia FINDINGS: LUNGS AND PLEURA: Small left pleural effusion, stable. Small right pleural effusion. Stable interstitial opacities bilateral. Persistent left retrocardiac consolidation is noted. HEART AND MEDIASTINUM: Cardiomegaly, unchanged. Atherosclerotic calcifications. BONES AND SOFT TISSUES: Old right rib fractures. IMPRESSION: 1. Persistent left retrocardiac consolidation. 2. Small left pleural effusion, stable, and small right pleural effusion. 3. Stable interstitial opacities bilaterally. Electronically signed by: Oneil Devonshire MD 08/02/2024 07:23 PM EST RP Workstation: [id number]     Medications:    azithromycin  500 mg (08/02/24 1811)   piperacillin-tazobactam (ZOSYN)  IV 2.25 g (08/03/24 0929)     stroke: early stages of recovery book   Does not apply Once   acidophilus  2 capsule Oral BID   aspirin  EC  81  mg Oral Daily   calcium  acetate  1,334 mg Oral TID WC   Chlorhexidine  Gluconate Cloth  6 each Topical Q0600   clopidogrel  75 mg Oral Daily   feeding supplement (NEPRO CARB STEADY)  237 mL Oral BID BM   Gerhardt's butt cream   Topical TID   guaiFENesin  600 mg Oral BID   heparin  injection (subcutaneous)  5,000 Units Subcutaneous Q8H   levothyroxine   88 mcg Oral Q0600   rOPINIRole   0.5 mg Oral BID   rosuvastatin   10 mg Oral Daily   sertraline  50 mg Oral Daily   acetaminophen  **OR** acetaminophen , benzocaine, hydrALAZINE , ipratropium-albuterol , loperamide, magnesium hydroxide, ondansetron  **OR** ondansetron  (ZOFRAN ) IV, traZODone   Assessment/ Plan:  Cassandra [name] is a 89 y.o.  female with past medical conditions including anxiety, hypertension, RLS, neuropathy, and end-stage renal disease on hemodialysis.  Patient presents to the emergency department with weakness and confusion and has been admitted for ESRD (end stage renal disease) on  dialysis (HCC) [N18.6, Z99.2] Sepsis due to pneumonia (HCC) [J18.9, A41.9] Pneumonia due to infectious organism, unspecified laterality, unspecified part of lung [J18.9] Sepsis, due to unspecified organism, unspecified whether acute organ dysfunction present (HCC) [A41.9]  [facility]/TTS/left aVF   End-stage renal disease on hemodialysis.  Dialysis received yesterday, with fluid removal. Next treatment scheduled for Tuesday.   2. Anemia of chronic kidney disease Lab Results  Component Value Date   HGB 12.7 08/02/2024    Will continue to monitor Hgb during this admission.   3. Secondary Hyperparathyroidism: with outpatient labs: None available Lab Results  Component Value Date   CALCIUM  7.8 (L) 08/02/2024   CAION 1.11 (L) 05/25/2023   PHOS 3.3 07/31/2024    Continue calcium  acetate with meals.  4. Hypertension with chronic kidney disease. Home regimen includes amlodipine  and clonidine . Blood pressure stable   LOS: 8 Bradon Fester 11/9/202510:31 AM

## 2024-08-04 DIAGNOSIS — J189 Pneumonia, unspecified organism: Secondary | ICD-10-CM | POA: Diagnosis not present

## 2024-08-04 DIAGNOSIS — A419 Sepsis, unspecified organism: Secondary | ICD-10-CM | POA: Diagnosis not present

## 2024-08-04 LAB — GLUCOSE, CAPILLARY: Glucose-Capillary: 71 mg/dL (ref 70–99)

## 2024-08-04 MED ORDER — HYDROCODONE-ACETAMINOPHEN 5-325 MG PO TABS: 1.0000 | ORAL_TABLET | Freq: Three times a day (TID) | ORAL | 0 refills | Status: DC | PRN

## 2024-08-04 MED ORDER — RISAQUAD PO CAPS: 2.0000 | ORAL_CAPSULE | Freq: Two times a day (BID) | ORAL | Status: AC

## 2024-08-04 MED ORDER — BENZOCAINE 10 % MT GEL: Freq: Three times a day (TID) | OROMUCOSAL | Status: DC | PRN

## 2024-08-04 MED ORDER — AMLODIPINE BESYLATE 10 MG PO TABS: 10.0000 mg | ORAL_TABLET | Freq: Every day | ORAL | Status: DC

## 2024-08-04 MED ORDER — CIPROFLOXACIN HCL 250 MG PO TABS: 250.0000 mg | ORAL_TABLET | Freq: Two times a day (BID) | ORAL | Status: DC

## 2024-08-04 MED ORDER — CLOPIDOGREL BISULFATE 75 MG PO TABS: 75.0000 mg | ORAL_TABLET | Freq: Every day | ORAL | Status: DC

## 2024-08-04 MED ORDER — GERHARDT'S BUTT CREAM: 1.0000 | TOPICAL_CREAM | Freq: Three times a day (TID) | CUTANEOUS | Status: DC

## 2024-08-04 MED ORDER — ALPRAZOLAM 0.25 MG PO TABS: 0.2500 mg | ORAL_TABLET | Freq: Every day | ORAL | 0 refills | Status: DC | PRN

## 2024-08-04 MED ORDER — TRAZODONE HCL 50 MG PO TABS: 50.0000 mg | ORAL_TABLET | Freq: Every evening | ORAL | Status: DC | PRN

## 2024-08-04 NOTE — Plan of Care (Signed)
  Problem: Clinical Measurements: Goal: Signs and symptoms of infection will decrease Outcome: Progressing   Problem: Respiratory: Goal: Ability to maintain adequate ventilation will improve Outcome: Progressing   Problem: Education: Goal: Knowledge of General Education information will improve Description: Including pain rating scale, medication(s)/side effects and non-pharmacologic comfort measures Outcome: Progressing   Problem: Clinical Measurements: Goal: Diagnostic test results will improve Outcome: Progressing   Problem: Coping: Goal: Level of anxiety will decrease Outcome: Progressing   Problem: Safety: Goal: Ability to remain free from injury will improve Outcome: Progressing

## 2024-08-04 NOTE — Plan of Care (Signed)
 Problem: Fluid Volume: Goal: Hemodynamic stability will improve Outcome: Progressing   Problem: Clinical Measurements: Goal: Diagnostic test results will improve Outcome: Progressing Goal: Signs and symptoms of infection will decrease Outcome: Progressing   Problem: Respiratory: Goal: Ability to maintain adequate ventilation will improve Outcome: Progressing   Problem: Education: Goal: Knowledge of General Education information will improve Description: Including pain rating scale, medication(s)/side effects and non-pharmacologic comfort measures Outcome: Progressing   Problem: Health Behavior/Discharge Planning: Goal: Ability to manage health-related needs will improve Outcome: Progressing   Problem: Clinical Measurements: Goal: Ability to maintain clinical measurements within normal limits will improve Outcome: Progressing Goal: Will remain free from infection Outcome: Progressing Goal: Diagnostic test results will improve Outcome: Progressing Goal: Respiratory complications will improve Outcome: Progressing Goal: Cardiovascular complication will be avoided Outcome: Progressing   Problem: Activity: Goal: Risk for activity intolerance will decrease Outcome: Progressing   Problem: Nutrition: Goal: Adequate nutrition will be maintained Outcome: Progressing   Problem: Coping: Goal: Level of anxiety will decrease Outcome: Progressing   Problem: Elimination: Goal: Will not experience complications related to bowel motility Outcome: Progressing Goal: Will not experience complications related to urinary retention Outcome: Progressing   Problem: Pain Managment: Goal: General experience of comfort will improve and/or be controlled Outcome: Progressing   Problem: Safety: Goal: Ability to remain free from injury will improve Outcome: Progressing   Problem: Skin Integrity: Goal: Risk for impaired skin integrity will decrease Outcome: Progressing   Problem:  Education: Goal: Knowledge of disease and its progression will improve Outcome: Progressing Goal: Individualized Educational Video(s) Outcome: Progressing   Problem: Fluid Volume: Goal: Compliance with measures to maintain balanced fluid volume will improve Outcome: Progressing   Problem: Health Behavior/Discharge Planning: Goal: Ability to manage health-related needs will improve Outcome: Progressing   Problem: Nutritional: Goal: Ability to make healthy dietary choices will improve Outcome: Progressing   Problem: Clinical Measurements: Goal: Complications related to the disease process, condition or treatment will be avoided or minimized Outcome: Progressing   Problem: Education: Goal: Knowledge of disease or condition will improve Outcome: Progressing Goal: Knowledge of the prescribed therapeutic regimen will improve Outcome: Progressing Goal: Individualized Educational Video(s) Outcome: Progressing   Problem: Activity: Goal: Ability to tolerate increased activity will improve Outcome: Progressing Goal: Will verbalize the importance of balancing activity with adequate rest periods Outcome: Progressing   Problem: Respiratory: Goal: Ability to maintain a clear airway will improve Outcome: Progressing Goal: Levels of oxygenation will improve Outcome: Progressing Goal: Ability to maintain adequate ventilation will improve Outcome: Progressing   Problem: Education: Goal: Ability to describe self-care measures that may prevent or decrease complications (Diabetes Survival Skills Education) will improve Outcome: Progressing Goal: Individualized Educational Video(s) Outcome: Progressing   Problem: Coping: Goal: Ability to adjust to condition or change in health will improve Outcome: Progressing   Problem: Fluid Volume: Goal: Ability to maintain a balanced intake and output will improve Outcome: Progressing   Problem: Health Behavior/Discharge Planning: Goal:  Ability to identify and utilize available resources and services will improve Outcome: Progressing Goal: Ability to manage health-related needs will improve Outcome: Progressing   Problem: Metabolic: Goal: Ability to maintain appropriate glucose levels will improve Outcome: Progressing   Problem: Nutritional: Goal: Maintenance of adequate nutrition will improve Outcome: Progressing Goal: Progress toward achieving an optimal weight will improve Outcome: Progressing   Problem: Skin Integrity: Goal: Risk for impaired skin integrity will decrease Outcome: Progressing   Problem: Tissue Perfusion: Goal: Adequacy of tissue perfusion will improve Outcome: Progressing   Problem:  Education: Goal: Knowledge of disease or condition will improve Outcome: Progressing Goal: Knowledge of secondary prevention will improve (MUST DOCUMENT ALL) Outcome: Progressing Goal: Knowledge of patient specific risk factors will improve (DELETE if not current risk factor) Outcome: Progressing

## 2024-08-04 NOTE — Progress Notes (Signed)
 NAME:  Cassandra [name], MRN:  [id number], DOB:  [birth year], LOS: 9 ADMISSION DATE:  07/26/2024, CONSULTATION DATE:  07/27/2024 REFERRING MD:  Leita Blanch MD, CHIEF COMPLAINT:  CAP   History of Present Illness:  Cassandra [name] is a 89 year old female patient with a past medical history of coronary artery disease, end-stage renal disease on HD Tuesday Thursday Saturday, hypertension, dyslipidemia, hypothyroidism, type 2 diabetes mellitus who presented to the emergency department on 07/26/2024 with fatigue and generalized weakness.  She was found to be encephalopathic.  Apparently last known well the morning of her presentation then underwent HD and after HD she was noted to be more lethargic.  EMS were called and and found her to be hypotensive and therefore she was brought in to the emergency department.  She was found to be slightly hypotensive and responded to fluids.  She was also found to be hypoxic and was placed on 10 L nasal cannula.  Labs were notable for an elevated white count of 34.4 with neutrophilic predominance at 85%.  Platelets were elevated at 1000.  Lactic acid of 3.4.  Electrolytes were within normal range.  Creatinine was 5.46 mg/dL but she is a hemodialysis patient.  CT chest on 11/1 with multifocal large consolidation greatest in the left lower lobe and lingula.  She was admitted and started on ceftriaxone  and azithromycin .  Pulmonary team is being consulted for help with further management.   07/30/24- patient has improved quite a bit, she's only on 2-3L/min.  After stopping her gabapentin , requip , solucortef, benadryl  she is awake and interactive she was able to tell me correct year & month but was unsure if she's is at [facility] [place] vs [facility] which is imprressive. Her CXR shows moderate left effusion I have placed IR consult for thoracentesis.  She is close to baseline.    08/01/24- patient seen with family at bedside.  Her breathing is stable.  She had cardio evaluation  and smiles during interview.  WBC count is improved.  She remains on zosyn.   08/02/24- patient appears stable clinically reporting no dyspnea at rest. She is on 2L/min and is on zosyn.  RN reports no issues today.  Im going to restart her IV Zithromax  and repeat her CXR today.    08/03/24- patient is stable on 2L/min [place].  She is now on day 7 abx.  Her wbc count is complicated by uderlying MDS which is managed by oncology. We will wean off IV abx and continue PO regimen for 10 day course total. Ive started Cipro bid.    08/04/24- patient is up and in no distress.  She is lucid reporting she dropped her phone which she did and was able to pick it up.  She is asking for dc home.  She reports that she will go to rehab.   IMAGING   Narrative & Impression  EXAM: CT CHEST, ABDOMEN AND PELVIS WITHOUT CONTRAST 03/05/2023 09:23:51 PM   TECHNIQUE: CT of the chest, abdomen and pelvis was performed without the administration of intravenous contrast. Multiplanar reformatted images are provided for review. Automated exposure control, iterative reconstruction, and/or weight based adjustment of the mA/kV was utilized to reduce the radiation dose to as low as reasonably achievable.   COMPARISON: None available.   CLINICAL HISTORY: Sepsis.   FINDINGS:   CHEST:   MEDIASTINUM AND LYMPH NODES: Mild cardiomegaly. Diffuse coronary artery and aortic atherosclerosis. The central airways are clear. No mediastinal, hilar or axillary lymphadenopathy.   LUNGS  AND PLEURA: Consolidation in the left lower lobe and lingula and to a lesser extent right lower lobe compatible with pneumonia. No pleural effusion or pneumothorax.   ABDOMEN AND PELVIS:   LIVER: The liver is unremarkable.   GALLBLADDER AND BILE DUCTS: Small layering gallstones within the gallbladder. No evidence of acute cholecystitis. No biliary ductal dilatation.   SPLEEN: No acute abnormality.   PANCREAS: No acute abnormality.    ADRENAL GLANDS: No acute abnormality.   KIDNEYS, URETERS AND BLADDER: Atrophic kidneys bilaterally. Left renal mid pole cyst measures 3.5 cm. Per consensus, no follow-up is needed for simple Bosniak type 1 and 2 renal cysts, unless the patient has a malignancy history or risk factors. The urinary bladder is decompressed. No stones in the kidneys or ureters. No hydronephrosis. No perinephric or periureteral stranding.   GI AND BOWEL: Stomach demonstrates no acute abnormality. Postoperative changes in the rectosigmoid colon. Scattered colonic diverticulosis, most pronounced in the left colon. There is no bowel obstruction.   REPRODUCTIVE ORGANS: Prior hysterectomy.   PERITONEUM AND RETROPERITONEUM: No ascites. No free air.   VASCULATURE: Aorta is normal in caliber.   ABDOMINAL AND PELVIS LYMPH NODES: No lymphadenopathy.   BONES AND SOFT TISSUES: Diffuse degenerative disc and facet disease throughout the thoracolumbar spine. No focal soft tissue abnormality.   IMPRESSION: 1. Multifocal consolidation greatest in the left lower lobe and lingula, compatible with pneumonia.-continue zosyn/zithromax . CXR repeating today  2. Cholelithiasis without acute cholecystitis. 3. Bilateral renal atrophy. 4. Colonic diverticulosis. 5. Aortic atherosclerosis. Coronary artery disease.   Electronically signed by: Franky Crease MD 07/26/2024 09:37 PM EDT RP Workstation: [id number]   Objective    Blood pressure (!) 148/68, pulse 72, temperature 97.8 F (36.6 C), temperature source Axillary, resp. rate 18, height 5' 1 (1.549 m), weight 53.1 kg, SpO2 93%.        Intake/Output Summary (Last 24 hours) at 08/04/2024 1051 Last data filed at 08/03/2024 2100 Gross per 24 hour  Intake 600 ml  Output --  Net 600 ml    Filed Weights   08/02/24 0757 08/02/24 1155 08/02/24 1230  Weight: 53.9 kg 53.1 kg 53.1 kg    Examination: General: NAD.  HENT: Supple neck, reactive pupils Lungs:  Diminished air entry over the left hemithorax.  Cardiovascular: Normal S1, Normal S2, RRR Abdomen: Soft, non tender, non distended  Extremities: Warm, no edema.   Labs and imaging were reviewed.  Assessment and Plan  Cassandra [name] is a 89 year old female patient with a past medical history of coronary artery disease, end-stage renal disease on HD Tuesday Thursday Saturday, hypertension, dyslipidemia, hypothyroidism, type 2 diabetes mellitus who presented to the emergency department on 07/26/2024 with fatigue and generalized weakness.  She was found to have a large left lower and lingular consolidation.  # Sepsis -PRESENT ON ADMISSION - DUE TO PNEUMONIA- on zosyn now improved with trnding down wbc count-added zithromax  IV #Bilateral pleural effusions - IR US  thoracentesis not able to aspirate due to low vol effusion  # Multifocal pneumonia with large consolidation in the left lowe lobe and lingula. -IMPROVED  # ESRD on HD TTS  Plan [] -DC SOLUCORTEF, DC BENADRYL , DC GABAPENTIN , DC REQUIP  ,DC ZITHROMAX  AND ZOSYN continue cipro bid for 3 days more to complete 10d course []  Titrate for SpO2 > 90%.  -START CHEST PT WITH INCENTIVE SPIROMETRY FLUTTER VALVE AND PT/OT WHEN ABLE   Halina Picking, MD 08/04/2024 10:51 AM

## 2024-08-04 NOTE — Discharge Summary (Signed)
 Physician Discharge Summary   Cassandra [name]  female DOB: 1927-02-15  FMW:[id number]  PCP: Cleotilde Oneil FALCON, MD  Admit date: 07/26/2024 Discharge date: 08/04/2024  Admitted From: home Disposition:  SNF rehab CODE STATUS: DNR  Discharge Instructions     Ambulatory referral to Neurology   Complete by: As directed    An appointment is requested in approximately: 8 weeks   Discharge diet:   Complete by: As directed    Renal diet.  Dys 3. - -   Discharge wound care:   Complete by: As directed    3 times daily      Cleanse perineum and labia with Vashe wound cleanser Soila [phone]) do not rinse and allow to air dry. Apply Gerhardt's Butt Cream 3 times a day and prn soiling. [facility] Course:  For full details, please see H&P, progress notes, consult notes and ancillary notes.  Briefly,  Cassandra [name] is a 89 y.o. female with medical history significant for coronary artery disease, end-stage renal disease on hemodialysis on TTS, chronic hypoxemic respiratory failure on 2L O2 PRN, recent acute CVA, hypertension, dyslipidemia, hypothyroidism, lumbar stenosis, and type 2 diabetes mellitus, who presented to the emergency room with acute onset of fatigue and generalized weakness with altered mental status and lethargy.  Blood pressure was 79/38 with EMS.  Patient admitted to cough productive of whitish sputum    Severe sepsis due to multifocal pneumonia [facility]) -- Patient came in with altered mental status, respiratory distress, tachycardia, tachypnea, elevated lactic acid and multifocal pneumonia.  large consolidation in the left lowe lobe and lingula.  -- Pulmonary consulted --s/p IV hydrocortisone stress dose 100 mg q12h for 5 doses. --pt completed 5 days of azithromycin , 8 days of zosyn, and 2 days of Cipro (1 more day of Cipro after discharge).  Chronic hypoxemic respiratory failure On 2L O2 PRN --Continue supplemental O2 to keep sats >=90%   Acute  strokes --pt was just recently discharged on 07/18/24 after admission for acute stroke, and was discharged on cardiac monitor.   --during this hospitalization, pt was found to have more small acute strokes on MRI brain.  Likely embolic, however, has not found Afib or Aflutter on cardiac monitor thus far. --neuro consulted --cont current statin - ASA 81mg  daily + plavix 75mg  daily x 90 days f/b ASA 81mg  daily monotherapy after that - Outpatient referral to neurology at hospital discharge --Pt's cardiac monitor from last hospitalization was missing, so another was placed prior to discharge.  F/u with [facility] cardio for results.   Bilateral pleural effusions --US  thoracentesis found no safe window to tap --HD for fluid removal   ESRD on HD TTS --iHD per nephro   Elevated troponin 2/2 demand ischemia  --No chest pain.  IV heparin  for 48 hours per dr Fernand, completed.  Cardiology consulted, no plan for further cardiac diagnostics.   CAD --cont ASA, plavix and statin   Depression - Continue Zoloft.   Dyslipidemia -continue statin therapy.   Hypothyroidism - continue Synthroid .   Lethargy in the setting of PNA --improved after holding sedating meds    Hyperkalemia --corrected with dialysis   Leukocytosis, chronic Per onc note from Dr. Babara Nov 2024, Leukocytosis, acute on chronic. Due to Jak2 mutation positive myeloproliferative neoplasm/PV. --pt has not followed up with Dr. Babara since then.  Droxia  has been prescribed by pt's PCP. --cont Droxia   --outpatient f/u with onc Dr. Babara.   Diarrhea --likely due  to abx.  GI path neg --received Imodium PRN while inpatient. --cont probiotic (new)  HTN --increased home amlodipine  from 5 to 10 mg daily --d/c'ed home clonidine    Discharge Diagnoses:  Principal Problem:   Sepsis due to pneumonia [facility]) Active Problems:   NSTEMI (non-ST elevated myocardial infarction) [facility])   Essential hypertension   Hypothyroidism   Dyslipidemia    Depression   30 Day Unplanned Readmission Risk Score    Flowsheet Row ED to Hosp-Admission (Current) from 07/26/2024 in [facility] 1C MEDICAL TELEMETRY  30 Day Unplanned Readmission Risk Score (%) 27.89 Filed at 08/04/2024 0801    This score is the patient's risk of an unplanned readmission within 30 days of being discharged (0 -100%). The score is based on dignosis, age, lab data, medications, orders, and past utilization.   Low:  0-14.9   Medium: 15-21.9   High: 22-29.9   Extreme: 30 and above         Discharge Instructions:  Allergies as of 08/04/2024       Reactions   Atorvastatin Other (See Comments)   MYALGIA   Cyclobenzaprine Other (See Comments)   hallucination   Mirtazapine    Other reaction(s): Hallucination   Oxycodone-acetaminophen  Other (See Comments)   hallucination   Paroxetine Hcl Other (See Comments)   hallucination   Penicillins Swelling   Lip and orbital swelling Tolerated 3rd generation cephalosporin (CEFTRIAXONE ) between 03/05/2023 and 03/09/2023 with no documented ADRs.   Propoxyphene Other (See Comments)   hallucination   Trazodone  Other (See Comments)   hallucination        Medication List     STOP taking these medications    cloNIDine  0.1 MG tablet Commonly known as: CATAPRES        TAKE these medications    acidophilus Caps capsule Take 2 capsules by mouth 2 (two) times daily.   ALPRAZolam  0.25 MG tablet Commonly known as: XANAX  Take 1 tablet (0.25 mg total) by mouth daily as needed.   amLODipine  10 MG tablet Commonly known as: NORVASC  Take 1 tablet (10 mg total) by mouth daily. What changed:  medication strength how much to take   aspirin  EC 81 MG tablet Take 1 tablet (81 mg total) by mouth daily for 21 days. Swallow whole.   benzocaine 10 % mucosal gel Commonly known as: ORAJEL Use as directed in the mouth or throat 3 (three) times daily as needed for mouth pain.   betamethasone valerate 0.1 %  cream Commonly known as: VALISONE Apply 1 Application topically daily.   calcium  acetate 667 MG capsule Commonly known as: PHOSLO  Take 1,334 mg by mouth 3 (three) times daily.   cetirizine 10 MG tablet Commonly known as: ZYRTEC Take 1 tablet by mouth daily as needed for allergies.   ciprofloxacin 250 MG tablet Commonly known as: CIPRO Take 1 tablet (250 mg total) by mouth 2 (two) times daily for 1 day.   clopidogrel 75 MG tablet Commonly known as: PLAVIX Take 1 tablet (75 mg total) by mouth daily.   CryoDose TA Aero Generic drug: pentafluoroprop-tetrafluoroeth Apply 1 Application topically as directed. SPRAY 1 AS DIRECTED FOR USE DURING DIALYSIS TO ACCESS   [employer] D3 25 MCG (1000 UT) capsule Generic drug: Cholecalciferol  Take 1,000 Units by mouth daily.   Droxia  300 MG capsule Generic drug: hydroxyurea  TAKE 1 CAPSULE (300 MG TOTAL) BY MOUTH DAILY. MAY TAKE WITH FOOD TO MINIMIZE GI SIDE EFFECTS.   feeding supplement (NEPRO CARB STEADY) Liqd Take 237 mLs  by mouth 2 (two) times daily between meals.   gabapentin  100 MG capsule Commonly known as: NEURONTIN  Take 1-2 capsules (100-200 mg total) by mouth 3 (three) times daily. 100 mg am 100 mg afternoon 200 mg bedtime. Home med.   Gerhardt's butt cream Crea Apply 1 Application topically 3 (three) times daily.   glucose blood test strip Use once daily. Use as instructed.   HYDROcodone -acetaminophen  5-325 MG tablet Commonly known as: NORCO/VICODIN Take 1 tablet by mouth 3 (three) times daily as needed for moderate pain (pain score 4-6) or severe pain (pain score 7-10). What changed: when to take this   hydrOXYzine 10 MG tablet Commonly known as: ATARAX Take 10 mg by mouth daily as needed.   latanoprost  0.005 % ophthalmic solution Commonly known as: XALATAN  Place 1 drop into both eyes at bedtime.   levothyroxine  88 MCG tablet Commonly known as: SYNTHROID  Take 88 mcg by mouth daily. Take on an empty stomach 30 to 60  minutes before breakfast   lidocaine -prilocaine  cream Commonly known as: EMLA  Apply 1 Application topically as needed.   naloxone 4 MG/0.1ML Liqd nasal spray kit Commonly known as: NARCAN Place 4 mg into the nose once.   rOPINIRole  0.5 MG tablet Commonly known as: REQUIP  Take 1 tablet (0.5 mg total) by mouth in the morning and at bedtime.   rosuvastatin  10 MG tablet Commonly known as: CRESTOR  Take 1 tablet (10 mg total) by mouth daily. Reduced from 20 mg to 10 mg, due to ESRD.   sertraline 50 MG tablet Commonly known as: ZOLOFT Take 50 mg by mouth daily.   timolol  0.5 % ophthalmic gel-forming Commonly known as: TIMOPTIC -XR Place 1 drop into both eyes every morning.   traZODone  50 MG tablet Commonly known as: DESYREL  Take 1 tablet (50 mg total) by mouth at bedtime as needed for sleep. Home med. What changed:  when to take this reasons to take this additional instructions               Discharge Care Instructions  (From admission, onward)           Start     Ordered   08/04/24 0000  Discharge wound care:       Comments: 3 times daily      Cleanse perineum and labia with Vashe wound cleanser Soila [phone]) do not rinse and allow to air dry. Apply Gerhardt's Butt Cream 3 times a day and prn soiling. - -   08/04/24 0930             Contact information for follow-up providers     Alluri, Keller BROCKS, MD. Go in 1 week(s).   Specialty: Cardiology Contact information: [address] [place] [address] [phone]         Cleotilde Oneil FALCON, MD Follow up.   Specialty: Internal Medicine Contact information: [phone] [facility] [address] [facility] [place] [address] [phone]         Maree Jannett POUR, MD Follow up.   Specialty: Neurology Contact information: [address] [facility] [place] [address] [phone]              Contact information for after-discharge care      Destination     [facility] .   Service: Skilled Nursing Contact information: [address] [place]  [id number] [phone]  Allergies  Allergen Reactions   Atorvastatin Other (See Comments)    MYALGIA   Cyclobenzaprine Other (See Comments)    hallucination   Mirtazapine     Other reaction(s): Hallucination   Oxycodone-Acetaminophen  Other (See Comments)    hallucination   Paroxetine Hcl Other (See Comments)    hallucination   Penicillins Swelling    Lip and orbital swelling Tolerated 3rd generation cephalosporin (CEFTRIAXONE ) between 03/05/2023 and 03/09/2023 with no documented ADRs.   Propoxyphene Other (See Comments)    hallucination   Trazodone  Other (See Comments)    hallucination     The results of significant diagnostics from this hospitalization (including imaging, microbiology, ancillary and laboratory) are listed below for reference.   Consultations:   Procedures/Studies: DG Chest Port 1 View Result Date: 08/02/2024 EXAM: 1 VIEW(S) XRAY OF THE CHEST 08/02/2024 05:41:00 PM COMPARISON: 07/30/2024 CLINICAL HISTORY: Pneumonia FINDINGS: LUNGS AND PLEURA: Small left pleural effusion, stable. Small right pleural effusion. Stable interstitial opacities bilateral. Persistent left retrocardiac consolidation is noted. HEART AND MEDIASTINUM: Cardiomegaly, unchanged. Atherosclerotic calcifications. BONES AND SOFT TISSUES: Old right rib fractures. IMPRESSION: 1. Persistent left retrocardiac consolidation. 2. Small left pleural effusion, stable, and small right pleural effusion. 3. Stable interstitial opacities bilaterally. Electronically signed by: Oneil Devonshire MD 08/02/2024 07:23 PM EST RP Workstation: [id number]   US  Venous Img Lower Bilateral (DVT) Result Date: 07/31/2024 EXAM: ULTRASOUND DUPLEX OF THE BILATERAL LOWER EXTREMITY VEINS TECHNIQUE: Duplex ultrasound using B-mode/gray scaled imaging and Doppler  spectral analysis and color flow was obtained of the deep venous structures of the bilateral lower extremity. COMPARISON: 05/15/2021 and previous. CLINICAL HISTORY: 801713 Stroke (cerebrum) (HCC) 801713 Stroke (cerebrum) (HCC). FINDINGS: LEFT: The common femoral vein, femoral vein, popliteal vein, and posterior tibial vein of the left lower extremity demonstrate normal compressibility with normal color flow and spectral analysis. RIGHT: The common femoral vein, femoral vein, popliteal vein, and posterior tibial vein of the right lower extremity demonstrate normal compressibility with normal color flow and spectral analysis. IMPRESSION: 1. No evidence of DVT. Electronically signed by: Dayne Hassell MD 07/31/2024 03:58 PM EST RP Workstation: [id number]   US  CHEST (PLEURAL EFFUSION) Result Date: 07/30/2024 INDICATION: 89 year old female presents with left pleural effusion. Received request for diagnostic and therapeutic thoracentesis. EXAM: CHEST ULTRASOUND COMPARISON:  None Available. FINDINGS: Trace left pleural effusion. No significant pocket of fluid or percutaneous window to allow safe thoracentesis. Risks outweigh the benefits. IMPRESSION: Trace left pleural effusion with no safe window for percutaneous access. No procedure performed. Read by: Kristi Davenport, NP Electronically Signed   By: Wilkie Lent M.D.   On: 07/30/2024 16:32   MR BRAIN WO CONTRAST Result Date: 07/30/2024 EXAM: MRI BRAIN WITHOUT CONTRAST 07/30/2024 02:37:26 PM TECHNIQUE: Multiplanar multisequence MRI of the head/brain was performed without the administration of intravenous contrast. COMPARISON: None available. CLINICAL HISTORY: concern for stroke FINDINGS: BRAIN AND VENTRICLES: Multiple punctate foci of acute infarction are present within the subcortical white matter of the posterior right temporal lobe, right parietal lobe adjacent to the atrium of the right lateral ventricle, and the high posterior right parietal cortex. A 6 mm  focus of acute infarction is present in the left occipital lobe on image 68 of series 5. T2 and FLAIR signal is present in the left occipital infarct and high right posterior parietal infarct. Remote lacunar infarcts are present within the right basal ganglia. Periventricular and subcortical white matter changes are present bilaterally. No intracranial hemorrhage. No mass. No midline shift. No hydrocephalus. The sella  is unremarkable. Normal flow voids. The remainder of the exam is somewhat degraded by patient motion. ORBITS: Bilateral lens replacements are noted. The globes and orbits are otherwise within normal limits. SINUSES AND MASTOIDS: No acute abnormality. BONES AND SOFT TISSUES: Normal marrow signal. No acute soft tissue abnormality. IMPRESSION: 1. Multiple punctate foci of acute infarction in the posterior right temporal lobe, right parietal lobe adjacent to the atrium of the right lateral ventricle, and high posterior right parietal cortex. T2 signal changes are present within the right parietal lesion suggesting at least a subacute time frame of multiple infarcts involving the posterior territory 2. Acute/subacute 6 mm infarct in the left occipital lobe. 3. Remote lacunar infarcts in the right basal ganglia. Electronically signed by: Lonni Necessary MD 07/30/2024 04:00 PM EST RP Workstation: [id number]   DG Chest Port 1 View Result Date: 07/30/2024 EXAM: 1 VIEW(S) XRAY OF THE CHEST 07/30/2024 12:53:00 AM COMPARISON: 07/26/2024. CLINICAL HISTORY: Pneumonia. FINDINGS: LUNGS AND PLEURA: Diffuse peripheral predominant interstitial reticulation is favored to represent sequelae of chronic lung disease possibly early pulmonary fibrosis. Superimposed mild edema not excluded. Progressive airspace consolidation within the left mid and left lower lung concerning for worsening pneumonia. Moderate left pleural effusion. No pneumothorax. HEART AND MEDIASTINUM: Cardiomegaly. Calcified aorta. BONES AND SOFT  TISSUES: Old right rib fractures. IMPRESSION: 1. Progressive left mid and lower lung consolidation concerning for worsening pneumonia. 2. Moderate left pleural effusion. Electronically signed by: Waddell Calk MD 07/30/2024 06:29 AM EST RP Workstation: [id number]   ECHOCARDIOGRAM COMPLETE Result Date: 07/27/2024    ECHOCARDIOGRAM REPORT   Patient Name:   MCKINLEE DUNK Andaya Date of Exam: 07/27/2024 Medical Rec #:  [id number]         Height:       61.0 in Accession #:    [id number]        Weight:       130.1 lb Date of Birth:  1926/09/26        BSA:          1.573 m Patient Age:    89 years          BP:           117/55 mmHg Patient Gender: F                 HR:           75 bpm. Exam Location:  [facility] Procedure: 2D Echo, Color Doppler and Cardiac Doppler (Both Spectral and Color            Flow Doppler were utilized during procedure). Indications:     I21.4 NSTEMI  History:         Patient has prior history of Echocardiogram examinations. CAD,                  ESRD; Risk Factors:Hypertension.  Sonographer:     L. Thornton-Maynard Referring Phys:  [id number] [facility] [employer] [facility] Diagnosing Phys: Shelda Lonni MD  Sonographer Comments: Global longitudinal strain was attempted. IMPRESSIONS  1. Left ventricular ejection fraction, by estimation, is 65 to 70%. The left ventricle has normal function. The left ventricle has no regional wall motion abnormalities. There is mild concentric left ventricular hypertrophy. Left ventricular diastolic parameters are consistent with Grade I diastolic dysfunction (impaired relaxation). The average left ventricular global longitudinal strain is -18.6 %. The global longitudinal strain is normal.  2. Right ventricular systolic function is normal. The right ventricular size is normal. There is moderately elevated pulmonary artery  systolic pressure. The estimated right ventricular systolic pressure is 59.9 mmHg.  3. Right atrial size was mildly dilated.  4. The mitral valve is normal in  structure. Trivial mitral valve regurgitation. No evidence of mitral stenosis.  5. Tricuspid valve regurgitation is moderate.  6. The aortic valve is tricuspid. There is moderate calcification of the aortic valve. Aortic valve regurgitation is mild. Aortic valve sclerosis/calcification is present, without any evidence of aortic stenosis.  7. There is Moderate (Grade III) plaque involving the aortic arch and descending aorta.  8. The inferior vena cava is normal in size with greater than 50% respiratory variability, suggesting right atrial pressure of 3 mmHg. Comparison(s): Changes from prior study are noted. TR now moderate, elevated RVSP. FINDINGS  Left Ventricle: Left ventricular ejection fraction, by estimation, is 65 to 70%. The left ventricle has normal function. The left ventricle has no regional wall motion abnormalities. The average left ventricular global longitudinal strain is -18.6 %. Strain was performed and the global longitudinal strain is normal. The left ventricular internal cavity size was normal in size. There is mild concentric left ventricular hypertrophy. Left ventricular diastolic parameters are consistent with Grade I diastolic dysfunction (impaired relaxation). Right Ventricle: The right ventricular size is normal. No increase in right ventricular wall thickness. Right ventricular systolic function is normal. There is moderately elevated pulmonary artery systolic pressure. The tricuspid regurgitant velocity is 3.77 m/s, and with an assumed right atrial pressure of 3 mmHg, the estimated right ventricular systolic pressure is 59.9 mmHg. Left Atrium: Left atrial size was normal in size. Right Atrium: Right atrial size was mildly dilated. Pericardium: There is no evidence of pericardial effusion. Mitral Valve: The mitral valve is normal in structure. Trivial mitral valve regurgitation. No evidence of mitral valve stenosis. Tricuspid Valve: The tricuspid valve is normal in structure. Tricuspid  valve regurgitation is moderate . No evidence of tricuspid stenosis. Aortic Valve: Focal moderate calcification of noncoronary cusp. The aortic valve is tricuspid. There is moderate calcification of the aortic valve. Aortic valve regurgitation is mild. Aortic regurgitation PHT measures 474 msec. Aortic valve sclerosis/calcification is present, without any evidence of aortic stenosis. Aortic valve mean gradient measures 9.0 mmHg. Aortic valve peak gradient measures 16.6 mmHg. Aortic valve area, by VTI measures 1.89 cm. Pulmonic Valve: The pulmonic valve was not well visualized. Pulmonic valve regurgitation is trivial. No evidence of pulmonic stenosis. Aorta: The aortic root and ascending aorta are structurally normal, with no evidence of dilitation. There is moderate (Grade III) plaque involving the aortic arch and descending aorta. Venous: The inferior vena cava is normal in size with greater than 50% respiratory variability, suggesting right atrial pressure of 3 mmHg. IAS/Shunts: The atrial septum is grossly normal.  LEFT VENTRICLE PLAX 2D LVIDd:         4.20 cm     Diastology LVIDs:         2.70 cm     LV e' medial:    5.33 cm/s LV PW:         1.40 cm     LV E/e' medial:  18.0 LV IVS:        1.40 cm     LV e' lateral:   6.20 cm/s LVOT diam:     2.00 cm     LV E/e' lateral: 15.5 LV SV:         76 LV SV Index:   49          2D Longitudinal Strain LVOT Area:  3.14 cm    2D Strain GLS (A4C):   -17.8 % LV IVRT:       77 msec     2D Strain GLS (A3C):   -21.6 %                            2D Strain GLS (A2C):   -16.5 %                            2D Strain GLS Avg:     -18.6 % LV Volumes (MOD) LV vol d, MOD A2C: 68.6 ml LV vol d, MOD A4C: 35.6 ml LV vol s, MOD A2C: 12.7 ml LV vol s, MOD A4C: 9.8 ml LV SV MOD A2C:     55.9 ml LV SV MOD A4C:     35.6 ml LV SV MOD BP:      38.9 ml RIGHT VENTRICLE             IVC RV Basal diam:  3.93 cm     IVC diam: 1.40 cm RV Mid diam:    3.00 cm RV S prime:     12.90 cm/s  PULMONARY  VEINS TAPSE (M-mode): 2.6 cm      Diastolic Velocity: 51.60 cm/s                             S/D Velocity:       1.40                             Systolic Velocity:  73.90 cm/s LEFT ATRIUM             Index        RIGHT ATRIUM           Index LA diam:        2.80 cm 1.78 cm/m   RA Area:     18.00 cm LA Vol (A2C):   57.1 ml 36.29 ml/m  RA Volume:   49.60 ml  31.53 ml/m LA Vol (A4C):   32.4 ml 20.59 ml/m LA Biplane Vol: 43.4 ml 27.59 ml/m  AORTIC VALVE                     PULMONIC VALVE AV Area (Vmax):    1.63 cm      PV Vmax:          1.00 m/s AV Area (Vmean):   1.78 cm      PV Peak grad:     4.0 mmHg AV Area (VTI):     1.89 cm      PR End Diast Vel: 4.30 msec AV Vmax:           204.00 cm/s AV Vmean:          140.500 cm/s AV VTI:            0.403 m AV Peak Grad:      16.6 mmHg AV Mean Grad:      9.0 mmHg LVOT Vmax:         106.00 cm/s LVOT Vmean:        79.700 cm/s LVOT VTI:          0.243 m LVOT/AV VTI ratio: 0.60 AI PHT:  474 msec  AORTA Ao Root diam: 3.00 cm Ao Asc diam:  3.30 cm MITRAL VALVE                TRICUSPID VALVE MV Area (PHT): 3.31 cm     TR Peak grad:   56.9 mmHg MV Decel Time: 229 msec     TR Vmax:        377.00 cm/s MV E velocity: 95.90 cm/s MV A velocity: 106.00 cm/s  SHUNTS MV E/A ratio:  0.90         Systemic VTI:  0.24 m                             Systemic Diam: 2.00 cm Shelda Bruckner MD Electronically signed by Shelda Bruckner MD Signature Date/Time: 07/27/2024/2:52:08 PM    Final    CT CHEST ABDOMEN PELVIS WO CONTRAST Result Date: 07/26/2024 EXAM: CT CHEST, ABDOMEN AND PELVIS WITHOUT CONTRAST 03/05/2023 09:23:51 PM TECHNIQUE: CT of the chest, abdomen and pelvis was performed without the administration of intravenous contrast. Multiplanar reformatted images are provided for review. Automated exposure control, iterative reconstruction, and/or weight based adjustment of the mA/kV was utilized to reduce the radiation dose to as low as reasonably achievable.  COMPARISON: None available. CLINICAL HISTORY: Sepsis. FINDINGS: CHEST: MEDIASTINUM AND LYMPH NODES: Mild cardiomegaly. Diffuse coronary artery and aortic atherosclerosis. The central airways are clear. No mediastinal, hilar or axillary lymphadenopathy. LUNGS AND PLEURA: Consolidation in the left lower lobe and lingula and to a lesser extent right lower lobe compatible with pneumonia. No pleural effusion or pneumothorax. ABDOMEN AND PELVIS: LIVER: The liver is unremarkable. GALLBLADDER AND BILE DUCTS: Small layering gallstones within the gallbladder. No evidence of acute cholecystitis. No biliary ductal dilatation. SPLEEN: No acute abnormality. PANCREAS: No acute abnormality. ADRENAL GLANDS: No acute abnormality. KIDNEYS, URETERS AND BLADDER: Atrophic kidneys bilaterally. Left renal mid pole cyst measures 3.5 cm. Per consensus, no follow-up is needed for simple Bosniak type 1 and 2 renal cysts, unless the patient has a malignancy history or risk factors. The urinary bladder is decompressed. No stones in the kidneys or ureters. No hydronephrosis. No perinephric or periureteral stranding. GI AND BOWEL: Stomach demonstrates no acute abnormality. Postoperative changes in the rectosigmoid colon. Scattered colonic diverticulosis, most pronounced in the left colon. There is no bowel obstruction. REPRODUCTIVE ORGANS: Prior hysterectomy. PERITONEUM AND RETROPERITONEUM: No ascites. No free air. VASCULATURE: Aorta is normal in caliber. ABDOMINAL AND PELVIS LYMPH NODES: No lymphadenopathy. BONES AND SOFT TISSUES: Diffuse degenerative disc and facet disease throughout the thoracolumbar spine. No focal soft tissue abnormality. IMPRESSION: 1. Multifocal consolidation greatest in the left lower lobe and lingula, compatible with pneumonia. 2. Cholelithiasis without acute cholecystitis. 3. Bilateral renal atrophy. 4. Colonic diverticulosis. 5. Aortic atherosclerosis. Coronary artery disease. Electronically signed by: Franky Crease MD  07/26/2024 09:37 PM EDT RP Workstation: [id number]   CT HEAD WO CONTRAST ( ) Result Date: 07/26/2024 EXAM: CT HEAD WITHOUT CONTRAST 07/26/2024 09:23:51 PM TECHNIQUE: CT of the head was performed without the administration of intravenous contrast. Automated exposure control, iterative reconstruction, and/or weight based adjustment of the mA/kV was utilized to reduce the radiation dose to as low as reasonably achievable. COMPARISON: 05/02/2022 CLINICAL HISTORY: Headache, new onset (Age >= 51y) FINDINGS: BRAIN AND VENTRICLES: No acute hemorrhage. No evidence of acute infarct. No hydrocephalus. No extra-axial collection. No mass effect or midline shift. There is diffuse cerebral atrophy and chronic small vessel disease throughout the deep white  matter. ORBITS: No acute abnormality. SINUSES: No acute abnormality. SOFT TISSUES AND SKULL: No acute soft tissue abnormality. No skull fracture. IMPRESSION: 1. No acute intracranial abnormality. 2. Diffuse cerebral atrophy and chronic small vessel disease throughout the deep white matter. Electronically signed by: Franky Crease MD 07/26/2024 09:33 PM EDT RP Workstation: [id number]   DG Chest Portable 1 View Result Date: 07/26/2024 EXAM: 1 VIEW(S) XRAY OF THE CHEST 07/26/2024 08:43:00 PM COMPARISON: Portable chest 04/08/2024. CLINICAL HISTORY: Chest pain and lethargy with hypoxemia. Symptom onset following dialysis this morning. FINDINGS: LINES, TUBES AND DEVICES: An electronic device is superimposed over the chest centrally, and there are overlying telemetry leads. LUNGS AND PLEURA: There is dense airspace disease in the left mid to lower lung fields predominating in the periphery, most likely a consolidated pneumonia likely in the lingula as there is obscuration of the lower left heart border. There is a small pleural effusion, most likely a parapneumonic effusion. Rest of the lungs are clear of infiltrates with chronic interstitial changes. No pneumothorax. HEART AND  MEDIASTINUM: The aorta is tortuous and calcified, stable mediastinum. There is mild cardiomegaly but no evidence for CHF. BONES AND SOFT TISSUES: Osteopenia. There are new mildly displaced posterolateral fractures of the right 6th and 7th ribs, age indeterminate but probably subacute with suggestion of partial healing. DISCS/DEGENERATIVE CHANGES: Moderate thoracic spondylosis. IMPRESSION: 1. Dense left mid to lower lung airspace disease most consistent with lingular pneumonia, with a small likely parapneumonic effusion. Follow-up study recommended to ensure clearing. 2. New mildly displaced posterolateral fractures of the right 6th and 7th ribs, age indeterminate but possibly subacute. Electronically signed by: Francis Quam MD 07/26/2024 08:53 PM EDT RP Workstation: [id number]   ECHOCARDIOGRAM COMPLETE BUBBLE STUDY Result Date: 07/17/2024    ECHOCARDIOGRAM REPORT   Patient Name:   MEREDETH FURBER Fitzsimons Date of Exam: 07/17/2024 Medical Rec #:  [id number]         Height:       61.0 in Accession #:    [id number]        Weight:       121.5 lb Date of Birth:  1927/06/24        BSA:          1.528 m Patient Age:    89 years          BP:           170/63 mmHg Patient Gender: F                 HR:           66 bpm. Exam Location:  [facility] Procedure: 2D Echo, Cardiac Doppler, Color Doppler and Saline Contrast Bubble            Study (Both Spectral and Color Flow Doppler were utilized during            procedure). Indications:     Stroke  History:         Patient has prior history of Echocardiogram examinations, most                  recent 03/06/2023. CAD; Risk Factors:Hypertension, Diabetes and                  Dyslipidemia. CKD, ESRD.  Sonographer:     Philomena Daring Referring Phys:  [id number] AMY N COX Diagnosing Phys: Keller Alluri IMPRESSIONS  1. Left ventricular ejection fraction, by estimation, is 60 to 65%. The left ventricle has normal function. The  left ventricle has no regional wall motion abnormalities. There is mild  left ventricular hypertrophy. Left ventricular diastolic parameters are consistent with Grade I diastolic dysfunction (impaired relaxation).  2. Right ventricular systolic function is normal. The right ventricular size is normal.  3. The mitral valve is normal in structure. No evidence of mitral valve regurgitation.  4. The aortic valve is tricuspid. Aortic valve regurgitation is mild. Aortic valve sclerosis is present, with no evidence of aortic valve stenosis.  5. Agitated saline contrast bubble study was negative, with no evidence of any interatrial shunt. FINDINGS  Left Ventricle: Left ventricular ejection fraction, by estimation, is 60 to 65%. The left ventricle has normal function. The left ventricle has no regional wall motion abnormalities. The left ventricular internal cavity size was normal in size. There is  mild left ventricular hypertrophy. Left ventricular diastolic parameters are consistent with Grade I diastolic dysfunction (impaired relaxation). Right Ventricle: The right ventricular size is normal. No increase in right ventricular wall thickness. Right ventricular systolic function is normal. Left Atrium: Left atrial size was normal in size. Right Atrium: Right atrial size was normal in size. Pericardium: There is no evidence of pericardial effusion. Mitral Valve: The mitral valve is normal in structure. No evidence of mitral valve regurgitation. Tricuspid Valve: The tricuspid valve is normal in structure. Tricuspid valve regurgitation is trivial. Aortic Valve: The aortic valve is tricuspid. Aortic valve regurgitation is mild. Aortic valve sclerosis is present, with no evidence of aortic valve stenosis. Aortic valve mean gradient measures 7.0 mmHg. Aortic valve peak gradient measures 13.4 mmHg. Aortic valve area, by VTI measures 1.75 cm. Pulmonic Valve: The pulmonic valve was not well visualized. Pulmonic valve regurgitation is trivial. Aorta: The aortic root and ascending aorta are structurally  normal, with no evidence of dilitation. IAS/Shunts: The atrial septum is grossly normal. Agitated saline contrast was given intravenously to evaluate for intracardiac shunting. Agitated saline contrast bubble study was negative, with no evidence of any interatrial shunt.  LEFT VENTRICLE PLAX 2D LVIDd:         4.30 cm   Diastology LVIDs:         2.80 cm   LV e' medial:    5.44 cm/s LV PW:         1.00 cm   LV E/e' medial:  12.1 LV IVS:        1.10 cm   LV e' lateral:   7.07 cm/s LVOT diam:     1.80 cm   LV E/e' lateral: 9.3 LV SV:         71 LV SV Index:   46 LVOT Area:     2.54 cm  RIGHT VENTRICLE             IVC RV S prime:     12.50 cm/s  IVC diam: 1.70 cm TAPSE (M-mode): 2.4 cm LEFT ATRIUM             Index        RIGHT ATRIUM           Index LA diam:        2.50 cm 1.64 cm/m   RA Area:     10.60 cm LA Vol (A2C):   39.9 ml 26.11 ml/m  RA Volume:   17.20 ml  11.26 ml/m LA Vol (A4C):   22.7 ml 14.86 ml/m LA Biplane Vol: 30.7 ml 20.09 ml/m  AORTIC VALVE AV Area (Vmax):    1.63 cm AV Area (Vmean):  1.66 cm AV Area (VTI):     1.75 cm AV Vmax:           183.00 cm/s AV Vmean:          122.000 cm/s AV VTI:            0.405 m AV Peak Grad:      13.4 mmHg AV Mean Grad:      7.0 mmHg LVOT Vmax:         117.00 cm/s LVOT Vmean:        79.700 cm/s LVOT VTI:          0.279 m LVOT/AV VTI ratio: 0.69  AORTA Ao Root diam: 2.40 cm MITRAL VALVE MV Area (PHT): 2.76 cm     SHUNTS MV Decel Time: 275 msec     Systemic VTI:  0.28 m MV E velocity: 66.00 cm/s   Systemic Diam: 1.80 cm MV A velocity: 118.00 cm/s MV E/A ratio:  0.56 Keller Paterson Electronically signed by Keller Paterson Signature Date/Time: 07/17/2024/5:54:25 PM    Final    MR ANGIO HEAD WO CONTRAST Result Date: 07/16/2024 EXAM: MR Angiography Head without intravenous Contrast. 07/16/2024 06:29:44 PM TECHNIQUE: Magnetic resonance angiography images of the head without intravenous contrast. Multiplanar 2D and 3D reformatted images are provided for review.  COMPARISON: None provided. CLINICAL HISTORY: Neuro deficit, acute, stroke suspected; acute/subacute embolic infarcts on MRI. Neurology consulted for acute ischemic stroke, favored to be cardioembolic based on involvement of multiple vascular distributions. Patient will be seen in formal consultation tomorrow. In the meantime I have reviewed her chart and added on orders for MRA head wo, carotid US , and plavix 75mg  daily. FINDINGS: ANTERIOR CIRCULATION: No significant stenosis of the internal carotid arteries. No significant stenosis of the anterior cerebral arteries. No significant stenosis of the middle cerebral arteries. No aneurysm. POSTERIOR CIRCULATION: Limited visualization of the right vertebral artery V4 segment, which appears to be occluded. The left vertebral artery V4 segment is normal. No significant stenosis of the posterior cerebral arteries. No significant stenosis of the basilar artery. No aneurysm. IMPRESSION: 1. 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 Electronically signed by: Franky Stanford MD 07/16/2024 07:40 PM EDT RP Workstation: [id number]   MR Cervical Spine Wo Contrast Result Date: 07/16/2024 EXAM: MRI CERVICAL SPINE WITHOUT CONTRAST 07/16/2024 08:38:03 AM TECHNIQUE: Multiplanar multisequence MRI of the cervical spine was performed. COMPARISON: Cervical spine CT 02/13/2022 and MRI 11/28/2021. CLINICAL HISTORY: Cervical radiculopathy, left arm weakness, numbness/tingling, history of dialysis. FINDINGS: LIMITATIONS: The examination is motion degraded, severely so on axial images. The patient would not tolerate any attempts at repeat imaging. BONES AND ALIGNMENT: Chronic borderline widening of the atlantodental interval measuring 3 mm, unchanged from the prior MRI. Normal alignment elsewhere. No acute fracture or suspicious marrow lesion in the cervical spine. T5 compression fracture with severe anterior vertebral body height loss and mild  to moderate marrow edema, new from a 03/05/2023 chest CT. SPINAL CORD: Grossly normal cervical spinal cord signal within limitations of motion artifact. SOFT TISSUES: No paraspinal mass. DISC LEVELS: Mild cervical spondylosis is similar to the prior MRI with preserved disc space heights. A chronic left paracentral disc protrusion at C2-C3 results in slight left ventral cord flattening. No significant cervical spinal canal stenosis is present. Mild to moderate multilevel neural foraminal stenosis is grossly similar to the prior MRI with assessment limited by motion. IMPRESSION: 1. Motion degraded examination without an acute finding in the cervical spine.  2. T5 compression fracture with severe anterior height loss and mild to moderate marrow edema, new from 2024 and potentially subacute. 3. Mild cervical spondylosis without significant spinal stenosis. Electronically signed by: Dasie Hamburg MD 07/16/2024 09:29 AM EDT RP Workstation: [id number]   MR BRAIN WO CONTRAST Result Date: 07/16/2024 EXAM: MRI BRAIN WITHOUT CONTRAST 07/16/2024 08:38:03 AM TECHNIQUE: Multiplanar multisequence MRI of the head/brain was performed without the administration of intravenous contrast. COMPARISON: Head CT 05/02/2022 and MRI 10/12/2021. CLINICAL HISTORY: Neuro deficit, acute, stroke suspected; L arm weakness started yesterday. Patient receives dialysis. FINDINGS: The examination is mildly to moderately motion degraded. BRAIN AND VENTRICLES: There is a 1.6 cm focus of cortically based restricted diffusion and T2 hyperintensity in the posteroinferior right occipital lobe, and there is a 1 cm focus of milder diffusion weighted signal abnormality and T2 hyperintensity in the right temporal lobe. Additional punctate foci of mildly restricted diffusion are present in the right occipital lobe, left temporal lobe, left caudate head, posterior right insula, and posterior right frontal lobe. No intracranial hemorrhage, mass, hydrocephalus,  midline shift, or extra axial fluid collection is identified. There is mild cerebral atrophy. Patchy T2 hyperintensities in the cerebral white matter bilaterally have mildly progressed from the prior MRI and are nonspecific but compatible with mild chronic small vessel ischemic disease. A focus of susceptibility in the right sylvian fissure corresponds to likely vascular calcification on CT. Major intracranial vascular flow voids are preserved. ORBITS: Bilateral cataract extraction. SINUSES AND MASTOIDS: No acute abnormality. BONES AND SOFT TISSUES: Normal marrow signal. No acute soft tissue abnormality. IMPRESSION: 1. Scattered small foci of diffusion abnormality in both cerebral hemispheres suspicious for acute to subacute embolic infarcts. 2. Mild chronic small vessel ischemic disease. Electronically signed by: Dasie Hamburg MD 07/16/2024 09:15 AM EDT RP Workstation: [id number]      Labs: BNP (last 3 results) Recent Labs    08/24/23 1332 04/08/24 0605  BNP 429.9* 905.4*   Basic Metabolic Panel: Recent Labs  Lab 07/29/24 0413 07/31/24 0411 08/01/24 0454 08/02/24 0606  NA 134* 138 138 136  K 5.6* 4.4 3.8 4.0  CL 98 101 98 97*  CO2 20* 24 24 19*  GLUCOSE 115* 89 110* 55*  BUN 67* 67* 40* 52*  CREATININE 7.43* 6.04* 4.46* 6.00*  CALCIUM  7.9* 7.9* 8.4* 7.8*  MG  --  2.4 2.2 2.2  PHOS  --  3.3  --   --    Liver Function Tests: No results for input(s): AST, ALT, ALKPHOS, BILITOT, PROT, ALBUMIN  in the last 168 hours. No results for input(s): LIPASE, AMYLASE in the last 168 hours. No results for input(s): AMMONIA in the last 168 hours. CBC: Recent Labs  Lab 07/29/24 0413 07/31/24 0930 08/01/24 2340 08/02/24 0606 08/02/24 1328  WBC 30.9* 17.8* 23.9* 21.4* 23.1*  HGB 13.8 12.3 13.1 12.5 12.7  HCT 42.6 38.8 41.5 38.4 38.2  MCV 97.9 98.0 97.0 97.5 95.5  PLT 973* 675* 717* 662* 682*   Cardiac Enzymes: No results for input(s): CKTOTAL, CKMB, CKMBINDEX,  TROPONINI in the last 168 hours. BNP: Invalid input(s): POCBNP CBG: Recent Labs  Lab 07/31/24 1252  GLUCAP 76   D-Dimer No results for input(s): DDIMER in the last 72 hours. Hgb A1c No results for input(s): HGBA1C in the last 72 hours. Lipid Profile No results for input(s): CHOL, HDL, LDLCALC, TRIG, CHOLHDL, LDLDIRECT in the last 72 hours. Thyroid function studies No results for input(s): TSH, T4TOTAL, T3FREE, THYROIDAB in the last 72 hours.  Invalid  input(s): FREET3 Anemia work up No results for input(s): VITAMINB12, FOLATE, FERRITIN, TIBC, IRON, RETICCTPCT in the last 72 hours. Urinalysis    Component Value Date/Time   COLORURINE STRAW (A) 12/12/2021 1702   APPEARANCEUR CLEAR (A) 12/12/2021 1702   LABSPEC 1.005 12/12/2021 1702   PHURINE 7.0 12/12/2021 1702   GLUCOSEU 50 (A) 12/12/2021 1702   HGBUR MODERATE (A) 12/12/2021 1702   BILIRUBINUR NEGATIVE 12/12/2021 1702   KETONESUR NEGATIVE 12/12/2021 1702   PROTEINUR 100 (A) 12/12/2021 1702   NITRITE NEGATIVE 12/12/2021 1702   LEUKOCYTESUR SMALL (A) 12/12/2021 1702   Sepsis Labs Recent Labs  Lab 07/31/24 0930 08/01/24 2340 08/02/24 0606 08/02/24 1328  WBC 17.8* 23.9* 21.4* 23.1*   Microbiology Recent Results (from the past 240 hours)  Blood culture (routine x 2)     Status: None   Collection Time: 07/26/24  8:11 PM   Specimen: BLOOD  Result Value Ref Range Status   Specimen Description BLOOD RIGHT ANTECUBITAL  Final   Special Requests   Final    BOTTLES DRAWN AEROBIC AND ANAEROBIC Blood Culture adequate volume   Culture   Final    NO GROWTH 5 DAYS Performed at [facility], [address] [place], [place] [address]    Report Status 07/31/2024 FINAL  Final  Resp panel by RT-PCR (RSV, Flu A&B, Covid) Anterior Nasal Swab     Status: None   Collection Time: 07/26/24  8:15 PM   Specimen: Anterior Nasal Swab  Result Value Ref Range Status   SARS Coronavirus 2  by RT PCR NEGATIVE NEGATIVE Final    Comment: (NOTE) SARS-CoV-2 target nucleic acids are NOT DETECTED.  The SARS-CoV-2 RNA is generally detectable in upper respiratory specimens during the acute phase of infection. The lowest concentration of SARS-CoV-2 viral copies this assay can detect is 138 copies/mL. A negative result does not preclude SARS-Cov-2 infection and should not be used as the sole basis for treatment or other patient management decisions. A negative result may occur with  improper specimen collection/handling, submission of specimen other than nasopharyngeal swab, presence of viral mutation(s) within the areas targeted by this assay, and inadequate number of viral copies(<138 copies/mL). A negative result must be combined with clinical observations, patient history, and epidemiological information. The expected result is Negative.  Fact Sheet for Patients:  bloggercourse.com  Fact Sheet for Healthcare Providers:  seriousbroker.it  This test is no t yet approved or cleared by the [place]  FDA and  has been authorized for detection and/or diagnosis of SARS-CoV-2 by FDA under an Emergency Use Authorization (EUA). This EUA will remain  in effect (meaning this test can be used) for the duration of the COVID-19 declaration under Section 564(b)(1) of the Act, 21 U.S.C.section 360bbb-3(b)(1), unless the authorization is terminated  or revoked sooner.       Influenza A by PCR NEGATIVE NEGATIVE Final   Influenza B by PCR NEGATIVE NEGATIVE Final    Comment: (NOTE) The Xpert Xpress SARS-CoV-2/FLU/RSV plus assay is intended as an aid in the diagnosis of influenza from Nasopharyngeal swab specimens and should not be used as a sole basis for treatment. Nasal washings and aspirates are unacceptable for Xpert Xpress SARS-CoV-2/FLU/RSV testing.  Fact Sheet for Patients: bloggercourse.com  Fact  Sheet for Healthcare Providers: seriousbroker.it  This test is not yet approved or cleared by the [place]  FDA and has been authorized for detection and/or diagnosis of SARS-CoV-2 by FDA under an Emergency Use Authorization (EUA). This EUA will remain  in effect (meaning this test can be used) for the duration of the COVID-19 declaration under Section 564(b)(1) of the Act, 21 U.S.C. section 360bbb-3(b)(1), unless the authorization is terminated or revoked.     Resp Syncytial Virus by PCR NEGATIVE NEGATIVE Final    Comment: (NOTE) Fact Sheet for Patients: bloggercourse.com  Fact Sheet for Healthcare Providers: seriousbroker.it  This test is not yet approved or cleared by the [place]  FDA and has been authorized for detection and/or diagnosis of SARS-CoV-2 by FDA under an Emergency Use Authorization (EUA). This EUA will remain in effect (meaning this test can be used) for the duration of the COVID-19 declaration under Section 564(b)(1) of the Act, 21 U.S.C. section 360bbb-3(b)(1), unless the authorization is terminated or revoked.  Performed at [facility], [address] [place], [place] [address]   Blood culture (routine x 2)     Status: None   Collection Time: 07/26/24  8:16 PM   Specimen: BLOOD  Result Value Ref Range Status   Specimen Description BLOOD RIGHT ANTECUBITAL  Final   Special Requests   Final    BOTTLES DRAWN AEROBIC AND ANAEROBIC Blood Culture results may not be optimal due to an inadequate volume of blood received in culture bottles   Culture   Final    NO GROWTH 5 DAYS Performed at [facility], [address], [place], [place] [address]    Report Status 07/31/2024 FINAL  Final  MRSA Next Gen by PCR, Nasal     Status: None   Collection Time: 07/27/24  5:00 PM   Specimen: Nasal Mucosa; Nasal Swab  Result Value Ref Range Status   MRSA by PCR Next Gen  NOT DETECTED NOT DETECTED Final    Comment: (NOTE) The GeneXpert MRSA Assay (FDA approved for NASAL specimens only), is one component of a comprehensive MRSA colonization surveillance program. It is not intended to diagnose MRSA infection nor to guide or monitor treatment for MRSA infections. Test performance is not FDA approved in patients less than 5 years old. Performed at [facility], [address] [place], [place] [address]   Gastrointestinal Panel by PCR , Stool     Status: None   Collection Time: 08/01/24  4:32 PM   Specimen: Stool  Result Value Ref Range Status   Campylobacter species NOT DETECTED NOT DETECTED Final   Plesimonas shigelloides NOT DETECTED NOT DETECTED Final   Salmonella species NOT DETECTED NOT DETECTED Final   Yersinia enterocolitica NOT DETECTED NOT DETECTED Final   Vibrio species NOT DETECTED NOT DETECTED Final   Vibrio cholerae NOT DETECTED NOT DETECTED Final   Enteroaggregative E coli (EAEC) NOT DETECTED NOT DETECTED Final   Enteropathogenic E coli (EPEC) NOT DETECTED NOT DETECTED Final   Enterotoxigenic E coli (ETEC) NOT DETECTED NOT DETECTED Final   Shiga like toxin producing E coli (STEC) NOT DETECTED NOT DETECTED Final   Shigella/Enteroinvasive E coli (EIEC) NOT DETECTED NOT DETECTED Final   Cryptosporidium NOT DETECTED NOT DETECTED Final   Cyclospora cayetanensis NOT DETECTED NOT DETECTED Final   Entamoeba histolytica NOT DETECTED NOT DETECTED Final   Giardia lamblia NOT DETECTED NOT DETECTED Final   Adenovirus F40/41 NOT DETECTED NOT DETECTED Final   Astrovirus NOT DETECTED NOT DETECTED Final   Norovirus GI/GII NOT DETECTED NOT DETECTED Final   Rotavirus A NOT DETECTED NOT DETECTED Final   Sapovirus (I, II, IV, and V) NOT DETECTED NOT DETECTED Final    Comment: Performed at [facility], [address] [place], [place] [address]     Total time spend on discharging this patient, including the last patient exam,  discussing the hospital stay, instructions for ongoing care as it relates to all pertinent caregivers, as well as preparing the medical discharge records, prescriptions, and/or referrals as applicable, is 45 minutes.    Ellouise Haber, MD  [facility] 08/04/2024, 9:36 AM

## 2024-08-04 NOTE — Progress Notes (Signed)
 [facility]  ROUNDING NOTE   Subjective:   Cassandra [name] is a 89 year old female with past medical conditions including anxiety, hypertension, RLS, neuropathy, and end-stage renal disease on hemodialysis.  Patient presents to the emergency department with weakness and confusion and has been admitted for ESRD (end stage renal disease) on dialysis (HCC) [N18.6, Z99.2] Sepsis due to pneumonia (HCC) [J18.9, A41.9] Pneumonia due to infectious organism, unspecified laterality, unspecified part of lung [J18.9] Sepsis, due to unspecified organism, unspecified whether acute organ dysfunction present [facility]) [A41.9]  Patient is known to our practice and receives dialysis at [facility] on a TTS, supervised by Dr Marcelino.    Update  Patient seen laying in bed Alert, oriented to self States she needs to poop 2L [place]   Objective:  Vital signs in last 24 hours:  Temp:  [97.8 F (36.6 C)-98.6 F (37 C)] 97.8 F (36.6 C) (11/10 0900) Pulse Rate:  [72-78] 72 (11/10 0900) Resp:  [18] 18 (11/10 0900) BP: (148-153)/(55-68) 148/68 (11/10 0900) SpO2:  [80 %-98 %] 93 % (11/10 0900)  Weight change:  Filed Weights   08/02/24 0757 08/02/24 1155 08/02/24 1230  Weight: 53.9 kg 53.1 kg 53.1 kg    Intake/Output: I/O last 3 completed shifts: In: 600 [IV Piggyback:600] Out: -    Intake/Output this shift:  No intake/output data recorded.  Physical Exam: General: NAD  Head: Normocephalic, atraumatic. Moist oral mucosal membranes  Eyes: Anicteric  Lungs:  Diminished, [place] O2  Heart: Regular rate and rhythm  Abdomen:  Soft, nontender  Extremities: No peripheral edema.  Neurologic: Awake and alert, oriented to self  Skin: Warm,dry, no rash  Access: Left upper aVF    Basic Metabolic Panel: Recent Labs  Lab 07/29/24 0413 07/31/24 0411 08/01/24 0454 08/02/24 0606  NA 134* 138 138 136  K 5.6* 4.4 3.8 4.0  CL 98 101 98 97*  CO2 20* 24 24 19*  GLUCOSE 115* 89 110* 55*  BUN  67* 67* 40* 52*  CREATININE 7.43* 6.04* 4.46* 6.00*  CALCIUM  7.9* 7.9* 8.4* 7.8*  MG  --  2.4 2.2 2.2  PHOS  --  3.3  --   --     Liver Function Tests: No results for input(s): AST, ALT, ALKPHOS, BILITOT, PROT, ALBUMIN  in the last 168 hours.  No results for input(s): LIPASE, AMYLASE in the last 168 hours. No results for input(s): AMMONIA in the last 168 hours.  CBC: Recent Labs  Lab 07/29/24 0413 07/31/24 0930 08/01/24 2340 08/02/24 0606 08/02/24 1328  WBC 30.9* 17.8* 23.9* 21.4* 23.1*  HGB 13.8 12.3 13.1 12.5 12.7  HCT 42.6 38.8 41.5 38.4 38.2  MCV 97.9 98.0 97.0 97.5 95.5  PLT 973* 675* 717* 662* 682*    Cardiac Enzymes: No results for input(s): CKTOTAL, CKMB, CKMBINDEX, TROPONINI in the last 168 hours.  BNP: Invalid input(s): POCBNP  CBG: Recent Labs  Lab 07/31/24 1252  GLUCAP 76    Microbiology: Results for orders placed or performed during the hospital encounter of 07/26/24  Blood culture (routine x 2)     Status: None   Collection Time: 07/26/24  8:11 PM   Specimen: BLOOD  Result Value Ref Range Status   Specimen Description BLOOD RIGHT ANTECUBITAL  Final   Special Requests   Final    BOTTLES DRAWN AEROBIC AND ANAEROBIC Blood Culture adequate volume   Culture   Final    NO GROWTH 5 DAYS Performed at [facility], [address]  [place], [place] [address]    Report Status 07/31/2024 FINAL  Final  Resp panel by RT-PCR (RSV, Flu A&B, Covid) Anterior Nasal Swab     Status: None   Collection Time: 07/26/24  8:15 PM   Specimen: Anterior Nasal Swab  Result Value Ref Range Status   SARS Coronavirus 2 by RT PCR NEGATIVE NEGATIVE Final    Comment: (NOTE) SARS-CoV-2 target nucleic acids are NOT DETECTED.  The SARS-CoV-2 RNA is generally detectable in upper respiratory specimens during the acute phase of infection. The lowest concentration of SARS-CoV-2 viral copies this assay can detect is 138 copies/mL. A negative  result does not preclude SARS-Cov-2 infection and should not be used as the sole basis for treatment or other patient management decisions. A negative result may occur with  improper specimen collection/handling, submission of specimen other than nasopharyngeal swab, presence of viral mutation(s) within the areas targeted by this assay, and inadequate number of viral copies(<138 copies/mL). A negative result must be combined with clinical observations, patient history, and epidemiological information. The expected result is Negative.  Fact Sheet for Patients:  bloggercourse.com  Fact Sheet for Healthcare Providers:  seriousbroker.it  This test is no t yet approved or cleared by the [place]  FDA and  has been authorized for detection and/or diagnosis of SARS-CoV-2 by FDA under an Emergency Use Authorization (EUA). This EUA will remain  in effect (meaning this test can be used) for the duration of the COVID-19 declaration under Section 564(b)(1) of the Act, 21 U.S.C.section 360bbb-3(b)(1), unless the authorization is terminated  or revoked sooner.       Influenza A by PCR NEGATIVE NEGATIVE Final   Influenza B by PCR NEGATIVE NEGATIVE Final    Comment: (NOTE) The Xpert Xpress SARS-CoV-2/FLU/RSV plus assay is intended as an aid in the diagnosis of influenza from Nasopharyngeal swab specimens and should not be used as a sole basis for treatment. Nasal washings and aspirates are unacceptable for Xpert Xpress SARS-CoV-2/FLU/RSV testing.  Fact Sheet for Patients: bloggercourse.com  Fact Sheet for Healthcare Providers: seriousbroker.it  This test is not yet approved or cleared by the [place]  FDA and has been authorized for detection and/or diagnosis of SARS-CoV-2 by FDA under an Emergency Use Authorization (EUA). This EUA will remain in effect (meaning this test can be used)  for the duration of the COVID-19 declaration under Section 564(b)(1) of the Act, 21 U.S.C. section 360bbb-3(b)(1), unless the authorization is terminated or revoked.     Resp Syncytial Virus by PCR NEGATIVE NEGATIVE Final    Comment: (NOTE) Fact Sheet for Patients: bloggercourse.com  Fact Sheet for Healthcare Providers: seriousbroker.it  This test is not yet approved or cleared by the [place]  FDA and has been authorized for detection and/or diagnosis of SARS-CoV-2 by FDA under an Emergency Use Authorization (EUA). This EUA will remain in effect (meaning this test can be used) for the duration of the COVID-19 declaration under Section 564(b)(1) of the Act, 21 U.S.C. section 360bbb-3(b)(1), unless the authorization is terminated or revoked.  Performed at [facility], [address] [place], [place] [address]   Blood culture (routine x 2)     Status: None   Collection Time: 07/26/24  8:16 PM   Specimen: BLOOD  Result Value Ref Range Status   Specimen Description BLOOD RIGHT ANTECUBITAL  Final   Special Requests   Final    BOTTLES DRAWN AEROBIC AND ANAEROBIC Blood Culture results may not be optimal due to an inadequate volume of blood  received in culture bottles   Culture   Final    NO GROWTH 5 DAYS Performed at [facility], [address] [place], [place] [address]    Report Status 07/31/2024 FINAL  Final  MRSA Next Gen by PCR, Nasal     Status: None   Collection Time: 07/27/24  5:00 PM   Specimen: Nasal Mucosa; Nasal Swab  Result Value Ref Range Status   MRSA by PCR Next Gen NOT DETECTED NOT DETECTED Final    Comment: (NOTE) The GeneXpert MRSA Assay (FDA approved for NASAL specimens only), is one component of a comprehensive MRSA colonization surveillance program. It is not intended to diagnose MRSA infection nor to guide or monitor treatment for MRSA infections. Test performance is not FDA  approved in patients less than 16 years old. Performed at [facility], [address] [place], [place] [address]   Gastrointestinal Panel by PCR , Stool     Status: None   Collection Time: 08/01/24  4:32 PM   Specimen: Stool  Result Value Ref Range Status   Campylobacter species NOT DETECTED NOT DETECTED Final   Plesimonas shigelloides NOT DETECTED NOT DETECTED Final   Salmonella species NOT DETECTED NOT DETECTED Final   Yersinia enterocolitica NOT DETECTED NOT DETECTED Final   Vibrio species NOT DETECTED NOT DETECTED Final   Vibrio cholerae NOT DETECTED NOT DETECTED Final   Enteroaggregative E coli (EAEC) NOT DETECTED NOT DETECTED Final   Enteropathogenic E coli (EPEC) NOT DETECTED NOT DETECTED Final   Enterotoxigenic E coli (ETEC) NOT DETECTED NOT DETECTED Final   Shiga like toxin producing E coli (STEC) NOT DETECTED NOT DETECTED Final   Shigella/Enteroinvasive E coli (EIEC) NOT DETECTED NOT DETECTED Final   Cryptosporidium NOT DETECTED NOT DETECTED Final   Cyclospora cayetanensis NOT DETECTED NOT DETECTED Final   Entamoeba histolytica NOT DETECTED NOT DETECTED Final   Giardia lamblia NOT DETECTED NOT DETECTED Final   Adenovirus F40/41 NOT DETECTED NOT DETECTED Final   Astrovirus NOT DETECTED NOT DETECTED Final   Norovirus GI/GII NOT DETECTED NOT DETECTED Final   Rotavirus A NOT DETECTED NOT DETECTED Final   Sapovirus (I, II, IV, and V) NOT DETECTED NOT DETECTED Final    Comment: Performed at [facility], [address] [place], [place] [address]    Coagulation Studies: No results for input(s): LABPROT, INR in the last 72 hours.   Urinalysis: No results for input(s): COLORURINE, LABSPEC, PHURINE, GLUCOSEU, HGBUR, BILIRUBINUR, KETONESUR, PROTEINUR, UROBILINOGEN, NITRITE, LEUKOCYTESUR in the last 72 hours.  Invalid input(s): APPERANCEUR    Imaging: DG Chest Port 1 View Result Date: 08/02/2024 EXAM: 1 VIEW(S) XRAY OF THE  CHEST 08/02/2024 05:41:00 PM COMPARISON: 07/30/2024 CLINICAL HISTORY: Pneumonia FINDINGS: LUNGS AND PLEURA: Small left pleural effusion, stable. Small right pleural effusion. Stable interstitial opacities bilateral. Persistent left retrocardiac consolidation is noted. HEART AND MEDIASTINUM: Cardiomegaly, unchanged. Atherosclerotic calcifications. BONES AND SOFT TISSUES: Old right rib fractures. IMPRESSION: 1. Persistent left retrocardiac consolidation. 2. Small left pleural effusion, stable, and small right pleural effusion. 3. Stable interstitial opacities bilaterally. Electronically signed by: Oneil Devonshire MD 08/02/2024 07:23 PM EST RP Workstation: [id number]     Medications:       stroke: early stages of recovery book   Does not apply Once   acidophilus  2 capsule Oral BID   aspirin  EC  81 mg Oral Daily   calcium  acetate  1,334 mg Oral TID WC   Chlorhexidine  Gluconate Cloth  6 each Topical Q0600  ciprofloxacin  250 mg Oral BID   clopidogrel  75 mg Oral Daily   feeding supplement (NEPRO CARB STEADY)  237 mL Oral BID BM   Gerhardt's butt cream   Topical TID   guaiFENesin  600 mg Oral BID   heparin  injection (subcutaneous)  5,000 Units Subcutaneous Q8H   levothyroxine   88 mcg Oral Q0600   rOPINIRole   0.5 mg Oral BID   rosuvastatin   10 mg Oral Daily   sertraline  50 mg Oral Daily   acetaminophen  **OR** acetaminophen , benzocaine, hydrALAZINE , ipratropium-albuterol , loperamide, magnesium hydroxide, ondansetron  **OR** ondansetron  (ZOFRAN ) IV, traZODone   Assessment/ Plan:  Cassandra [name] is a 89 y.o.  female with past medical conditions including anxiety, hypertension, RLS, neuropathy, and end-stage renal disease on hemodialysis.  Patient presents to the emergency department with weakness and confusion and has been admitted for ESRD (end stage renal disease) on dialysis (HCC) [N18.6, Z99.2] Sepsis due to pneumonia (HCC) [J18.9, A41.9] Pneumonia due to infectious organism, unspecified  laterality, unspecified part of lung [J18.9] Sepsis, due to unspecified organism, unspecified whether acute organ dysfunction present (HCC) [A41.9]  [facility]/TTS/left aVF   End-stage renal disease on hemodialysis.  Next treatment scheduled for Tuesday.   2. Anemia of chronic kidney disease Lab Results  Component Value Date   HGB 12.7 08/02/2024    Hgb within optimal range for renal patient.   3. Secondary Hyperparathyroidism: with outpatient labs: None available Lab Results  Component Value Date   CALCIUM  7.8 (L) 08/02/2024   CAION 1.11 (L) 05/25/2023   PHOS 3.3 07/31/2024   Will continue to monitor bone minerals. Continue calcium  acetate with meals.  4. Hypertension with chronic kidney disease. Home regimen includes amlodipine  and clonidine . Blood pressure acceptable for this patient.    LOS: 9 Besan Ketchem 11/10/202511:10 AM

## 2024-08-04 NOTE — TOC Progression Note (Addendum)
 Transition of Care [facility]) - Progression Note    Patient Details  Name: Cassandra [name] MRN: [id number] Date of Birth: 11-07-1926  Transition of [facility] CM/SW Contact  Daved JONETTA Hamilton, RN Phone Number: 08/04/2024, 4:59 PM  Clinical Narrative:     Beatris with Ashley at [facility] and she requested dialysis documents to be sent. Requested documents sent to [facility]. Awaiting confirmation patient can discharge to [facility].  Left VMM with patients spouse to update that anticipate discharge to be tomorrow.     Barriers to Discharge: Continued Medical Work up               Expected Discharge Plan and Services     Post Acute Care Choice: Resumption of Svcs/PTA Provider Living arrangements for the past 2 months: Apartment Expected Discharge Date: 08/04/24                         HH Arranged: RN, PT [facility] Agency: [facility] Date [facility] Agency Contacted: 07/28/24   Representative spoke with at [facility] Agency: [place]   Social Drivers of Health (SDOH) Interventions SDOH Screenings   Food Insecurity: No Food Insecurity (07/27/2024)  Housing: Low Risk  (07/27/2024)  Transportation Needs: No Transportation Needs (07/27/2024)  Utilities: Not At Risk (07/27/2024)  Financial Resource Strain: Low Risk  (06/25/2024)   Received from [facility]  Social Connections: Moderately Integrated (07/27/2024)  Tobacco Use: Medium Risk (07/28/2024)    Readmission Risk Interventions    07/28/2024    2:52 PM 03/09/2023    4:09 PM  Readmission Risk Prevention Plan  Transportation Screening Complete Complete  PCP or Specialist Appt within 3-5 Days  Complete  HRI or Home Care Consult  Complete  Social Work Consult for Recovery Care Planning/Counseling  Complete  Palliative Care Screening  Not Applicable  Medication Review Oceanographer) Complete Complete  PCP or Specialist appointment within 3-5 days of discharge Complete   HRI or Home Care Consult Complete   SW  Recovery Care/Counseling Consult Complete   Palliative Care Screening Not Applicable   Skilled Nursing Facility Not Applicable

## 2024-08-04 NOTE — Progress Notes (Signed)
 D/C order noted.  Contacted [facility] Tts @ 6:30am advised of pt and d/c. Requested documents faxed to clinic for continuation of care.  Suzen Satchel Dialysis Navigator [phone]

## 2024-08-05 ENCOUNTER — Inpatient Hospital Stay

## 2024-08-05 DIAGNOSIS — J189 Pneumonia, unspecified organism: Secondary | ICD-10-CM | POA: Diagnosis not present

## 2024-08-05 DIAGNOSIS — A419 Sepsis, unspecified organism: Secondary | ICD-10-CM | POA: Diagnosis not present

## 2024-08-05 MED ORDER — MAGIC MOUTHWASH W/LIDOCAINE
10.0000 mL | Freq: Three times a day (TID) | ORAL | Status: DC
  Administered 2024-08-05: 5 mL via ORAL
  Administered 2024-08-06 (×2): 10 mL via ORAL
  Filled 2024-08-05 (×5): qty 10

## 2024-08-05 MED ORDER — LIDOCAINE-PRILOCAINE 2.5-2.5 % EX CREA
TOPICAL_CREAM | Freq: Two times a day (BID) | CUTANEOUS | Status: DC | PRN
  Filled 2024-08-05: qty 5

## 2024-08-05 MED ORDER — MAGIC MOUTHWASH W/LIDOCAINE: 10.0000 mL | Freq: Three times a day (TID) | ORAL | Status: AC

## 2024-08-05 NOTE — Plan of Care (Signed)
   Problem: Respiratory: Goal: Ability to maintain adequate ventilation will improve Outcome: Progressing

## 2024-08-05 NOTE — Progress Notes (Signed)
 Pt was due for discharge to SNF rehab on 08/04/24 (pls see d/c summary), however, we were later informed by SNF that they were not ready to accept pt.  Today again SNF was not able to accept.    Pt was seen at bedside today.  Remained Medically stable for discharge.  RN noted pt was not eating well due to painful ulcers in her mouth, though Pt said she was eating fine.  Magic mouth wash ordered in addition to Orajel.  I encouraged pt to drink her Nepro.  Pt also has superficial ulcers and skin breakdown around her rectum due to diarrhea.  Diarrhea is improving and stool is becoming more formed now that pt is no longer receiving abx, however, the area remains painful to touch.  Nursing continues to perform wound care and peri care.    Husband updated at bedside today.

## 2024-08-05 NOTE — Progress Notes (Signed)
 NAME:  Cassandra [name], MRN:  [id number], DOB:  1927/04/11, LOS: 10 ADMISSION DATE:  07/26/2024, CONSULTATION DATE:  07/27/2024 REFERRING MD:  Leita Blanch MD, CHIEF COMPLAINT:  CAP   History of Present Illness:  Cassandra [name] is a 89 year old female patient with a past medical history of coronary artery disease, end-stage renal disease on HD Tuesday Thursday Saturday, hypertension, dyslipidemia, hypothyroidism, type 2 diabetes mellitus who presented to the emergency department on 07/26/2024 with fatigue and generalized weakness.  She was found to be encephalopathic.  Apparently last known well the morning of her presentation then underwent HD and after HD she was noted to be more lethargic.  EMS were called and and found her to be hypotensive and therefore she was brought in to the emergency department.  She was found to be slightly hypotensive and responded to fluids.  She was also found to be hypoxic and was placed on 10 L nasal cannula.  Labs were notable for an elevated white count of 34.4 with neutrophilic predominance at 85%.  Platelets were elevated at 1000.  Lactic acid of 3.4.  Electrolytes were within normal range.  Creatinine was 5.46 mg/dL but she is a hemodialysis patient.  CT chest on 11/1 with multifocal large consolidation greatest in the left lower lobe and lingula.  She was admitted and started on ceftriaxone  and azithromycin .  Pulmonary team is being consulted for help with further management.   07/30/24- patient has improved quite a bit, she's only on 2-3L/min.  After stopping her gabapentin , requip , solucortef, benadryl  she is awake and interactive she was able to tell me correct year & month but was unsure if she's is at [facility] [place] vs [facility] which is imprressive. Her CXR shows moderate left effusion I have placed IR consult for thoracentesis.  She is close to baseline.    08/01/24- patient seen with family at bedside.  Her breathing is stable.  She had cardio evaluation  and smiles during interview.  WBC count is improved.  She remains on zosyn.   08/02/24- patient appears stable clinically reporting no dyspnea at rest. She is on 2L/min and is on zosyn.  RN reports no issues today.  Im going to restart her IV Zithromax  and repeat her CXR today.    08/03/24- patient is stable on 2L/min [place].  She is now on day 7 abx.  Her wbc count is complicated by uderlying MDS which is managed by oncology. We will wean off IV abx and continue PO regimen for 10 day course total. Ive started Cipro bid.    08/04/24- patient is up and in no distress.  She is lucid reporting she dropped her phone which she did and was able to pick it up.  She is asking for dc home.  She reports that she will go to rehab.   08/05/24- patient seen with at bedside with Cassandra [name] present during my interview. She is eating and is not chocking.  She's getting close to baseline.   IMAGING   Narrative & Impression  EXAM: CT CHEST, ABDOMEN AND PELVIS WITHOUT CONTRAST 03/05/2023 09:23:51 PM   TECHNIQUE: CT of the chest, abdomen and pelvis was performed without the administration of intravenous contrast. Multiplanar reformatted images are provided for review. Automated exposure control, iterative reconstruction, and/or weight based adjustment of the mA/kV was utilized to reduce the radiation dose to as low as reasonably achievable.   COMPARISON: None available.   CLINICAL HISTORY: Sepsis.   FINDINGS:   CHEST:  MEDIASTINUM AND LYMPH NODES: Mild cardiomegaly. Diffuse coronary artery and aortic atherosclerosis. The central airways are clear. No mediastinal, hilar or axillary lymphadenopathy.   LUNGS AND PLEURA: Consolidation in the left lower lobe and lingula and to a lesser extent right lower lobe compatible with pneumonia. No pleural effusion or pneumothorax.   ABDOMEN AND PELVIS:   LIVER: The liver is unremarkable.   GALLBLADDER AND BILE DUCTS: Small layering gallstones within the  gallbladder. No evidence of acute cholecystitis. No biliary ductal dilatation.   SPLEEN: No acute abnormality.   PANCREAS: No acute abnormality.   ADRENAL GLANDS: No acute abnormality.   KIDNEYS, URETERS AND BLADDER: Atrophic kidneys bilaterally. Left renal mid pole cyst measures 3.5 cm. Per consensus, no follow-up is needed for simple Bosniak type 1 and 2 renal cysts, unless the patient has a malignancy history or risk factors. The urinary bladder is decompressed. No stones in the kidneys or ureters. No hydronephrosis. No perinephric or periureteral stranding.   GI AND BOWEL: Stomach demonstrates no acute abnormality. Postoperative changes in the rectosigmoid colon. Scattered colonic diverticulosis, most pronounced in the left colon. There is no bowel obstruction.   REPRODUCTIVE ORGANS: Prior hysterectomy.   PERITONEUM AND RETROPERITONEUM: No ascites. No free air.   VASCULATURE: Aorta is normal in caliber.   ABDOMINAL AND PELVIS LYMPH NODES: No lymphadenopathy.   BONES AND SOFT TISSUES: Diffuse degenerative disc and facet disease throughout the thoracolumbar spine. No focal soft tissue abnormality.   IMPRESSION: 1. Multifocal consolidation greatest in the left lower lobe and lingula, compatible with pneumonia.-continue zosyn/zithromax . CXR repeating today  2. Cholelithiasis without acute cholecystitis. 3. Bilateral renal atrophy. 4. Colonic diverticulosis. 5. Aortic atherosclerosis. Coronary artery disease.   Electronically signed by: Franky Crease MD 07/26/2024 09:37 PM EDT RP Workstation: [id number]   Objective    Blood pressure (!) 152/59, pulse 77, temperature 99.4 F (37.4 C), temperature source Oral, resp. rate 18, height 5' 1 (1.549 m), weight 53.7 kg, SpO2 97%.       No intake or output data in the 24 hours ending 08/05/24 1832   Filed Weights   08/02/24 1155 08/02/24 1230 08/05/24 1009  Weight: 53.1 kg 53.1 kg 53.7 kg    Examination: General:  NAD.  HENT: Supple neck, reactive pupils Lungs: Diminished air entry over the left hemithorax.  Cardiovascular: Normal S1, Normal S2, RRR Abdomen: Soft, non tender, non distended  Extremities: Warm, no edema.   Labs and imaging were reviewed.  Assessment and Plan  Ms. Mccaffery is a 89 year old female patient with a past medical history of coronary artery disease, end-stage renal disease on HD Tuesday Thursday Saturday, hypertension, dyslipidemia, hypothyroidism, type 2 diabetes mellitus who presented to the emergency department on 07/26/2024 with fatigue and generalized weakness.  She was found to have a large left lower and lingular consolidation.  # Sepsis -PRESENT ON ADMISSION - DUE TO PNEUMONIA- on zosyn now improved with trnding down wbc count-added zithromax  IV #Bilateral pleural effusions - IR US  thoracentesis not able to aspirate due to low vol effusion -repeat cxr today  # Multifocal pneumonia with large consolidation in the left lowe lobe and lingula. -IMPROVED  # ESRD on HD TTS  Plan [] -DC SOLUCORTEF, DC BENADRYL , DC GABAPENTIN , DC REQUIP  ,DC ZITHROMAX  AND ZOSYN continue cipro bid for 3 days more to complete 10d course []  Titrate for SpO2 > 90%.  -START CHEST PT WITH INCENTIVE SPIROMETRY FLUTTER VALVE AND PT/OT WHEN ABLE   Halina Picking, MD 08/05/2024 6:32 PM

## 2024-08-05 NOTE — Progress Notes (Signed)
 PT Cancellation Note  Patient Details Name: Cassandra [name] MRN: [id number] DOB: [birth year]   Cancelled Treatment:    Reason Eval/Treat Not Completed: Patient at procedure or test/unavailable Pt receiving dialysis treatment. PT will check back at later time.    Chirstopher Iovino A Shelby Peltz 08/05/2024, 3:05 PM

## 2024-08-05 NOTE — Progress Notes (Signed)
 [facility]  ROUNDING NOTE   Subjective:   Cassandra [name] is a 89 year old female with past medical conditions including anxiety, hypertension, RLS, neuropathy, and end-stage renal disease on hemodialysis.  Patient presents to the emergency department with weakness and confusion and has been admitted for ESRD (end stage renal disease) on dialysis (HCC) [N18.6, Z99.2] Sepsis due to pneumonia (HCC) [J18.9, A41.9] Pneumonia due to infectious organism, unspecified laterality, unspecified part of lung [J18.9] Sepsis, due to unspecified organism, unspecified whether acute organ dysfunction present [facility]) [A41.9]  Patient is known to our practice and receives dialysis at [facility] on a TTS, supervised by Dr Marcelino.    Update Patient seen and evaluated during dialysis   HEMODIALYSIS FLOWSHEET:  Blood Flow Rate (mL/min): 299 mL/min Arterial Pressure (mmHg): -219.59 mmHg Venous Pressure (mmHg): 151.71 mmHg TMP (mmHg): 4.85 mmHg Ultrafiltration Rate (mL/min): 543 mL/min Dialysate Flow Rate (mL/min): 300 ml/min  Tolerating treatment well   Objective:  Vital signs in last 24 hours:  Temp:  [98 F (36.7 C)-99.1 F (37.3 C)] 99.1 F (37.3 C) (11/11 1009) Pulse Rate:  [72-77] 74 (11/11 1009) Resp:  [14-23] 21 (11/11 1034) BP: (103-160)/(48-69) 154/69 (11/11 1034) SpO2:  [90 %-96 %] 96 % (11/11 1009) Weight:  [53.7 kg] 53.7 kg (11/11 1009)  Weight change:  Filed Weights   08/02/24 1155 08/02/24 1230 08/05/24 1009  Weight: 53.1 kg 53.1 kg 53.7 kg    Intake/Output: I/O last 3 completed shifts: In: 600 [IV Piggyback:600] Out: -    Intake/Output this shift:  No intake/output data recorded.  Physical Exam: General: NAD  Head: Normocephalic, atraumatic. Moist oral mucosal membranes  Eyes: Anicteric  Lungs:  Diminished, Aragon O2  Heart: Regular rate and rhythm  Abdomen:  Soft, nontender  Extremities: No peripheral edema.  Neurologic: Awake and alert, oriented  to self  Skin: Warm,dry, no rash  Access: Left upper aVF    Basic Metabolic Panel: Recent Labs  Lab 07/31/24 0411 08/01/24 0454 08/02/24 0606  NA 138 138 136  K 4.4 3.8 4.0  CL 101 98 97*  CO2 24 24 19*  GLUCOSE 89 110* 55*  BUN 67* 40* 52*  CREATININE 6.04* 4.46* 6.00*  CALCIUM  7.9* 8.4* 7.8*  MG 2.4 2.2 2.2  PHOS 3.3  --   --     Liver Function Tests: No results for input(s): AST, ALT, ALKPHOS, BILITOT, PROT, ALBUMIN  in the last 168 hours.  No results for input(s): LIPASE, AMYLASE in the last 168 hours. No results for input(s): AMMONIA in the last 168 hours.  CBC: Recent Labs  Lab 07/31/24 0930 08/01/24 2340 08/02/24 0606 08/02/24 1328  WBC 17.8* 23.9* 21.4* 23.1*  HGB 12.3 13.1 12.5 12.7  HCT 38.8 41.5 38.4 38.2  MCV 98.0 97.0 97.5 95.5  PLT 675* 717* 662* 682*    Cardiac Enzymes: No results for input(s): CKTOTAL, CKMB, CKMBINDEX, TROPONINI in the last 168 hours.  BNP: Invalid input(s): POCBNP  CBG: Recent Labs  Lab 07/31/24 1252 08/04/24 2115  GLUCAP 76 71    Microbiology: Results for orders placed or performed during the hospital encounter of 07/26/24  Blood culture (routine x 2)     Status: None   Collection Time: 07/26/24  8:11 PM   Specimen: BLOOD  Result Value Ref Range Status   Specimen Description BLOOD RIGHT ANTECUBITAL  Final   Special Requests   Final    BOTTLES DRAWN AEROBIC AND ANAEROBIC Blood Culture adequate volume   Culture  Final    NO GROWTH 5 DAYS Performed at [facility], [address] [place], [place] [address]    Report Status 07/31/2024 FINAL  Final  Resp panel by RT-PCR (RSV, Flu A&B, Covid) Anterior Nasal Swab     Status: None   Collection Time: 07/26/24  8:15 PM   Specimen: Anterior Nasal Swab  Result Value Ref Range Status   SARS Coronavirus 2 by RT PCR NEGATIVE NEGATIVE Final    Comment: (NOTE) SARS-CoV-2 target nucleic acids are NOT DETECTED.  The SARS-CoV-2 RNA is  generally detectable in upper respiratory specimens during the acute phase of infection. The lowest concentration of SARS-CoV-2 viral copies this assay can detect is 138 copies/mL. A negative result does not preclude SARS-Cov-2 infection and should not be used as the sole basis for treatment or other patient management decisions. A negative result may occur with  improper specimen collection/handling, submission of specimen other than nasopharyngeal swab, presence of viral mutation(s) within the areas targeted by this assay, and inadequate number of viral copies(<138 copies/mL). A negative result must be combined with clinical observations, patient history, and epidemiological information. The expected result is Negative.  Fact Sheet for Patients:  bloggercourse.com  Fact Sheet for Healthcare Providers:  seriousbroker.it  This test is no t yet approved or cleared by the [place]  FDA and  has been authorized for detection and/or diagnosis of SARS-CoV-2 by FDA under an Emergency Use Authorization (EUA). This EUA will remain  in effect (meaning this test can be used) for the duration of the COVID-19 declaration under Section 564(b)(1) of the Act, 21 U.S.C.section 360bbb-3(b)(1), unless the authorization is terminated  or revoked sooner.       Influenza A by PCR NEGATIVE NEGATIVE Final   Influenza B by PCR NEGATIVE NEGATIVE Final    Comment: (NOTE) The Xpert Xpress SARS-CoV-2/FLU/RSV plus assay is intended as an aid in the diagnosis of influenza from Nasopharyngeal swab specimens and should not be used as a sole basis for treatment. Nasal washings and aspirates are unacceptable for Xpert Xpress SARS-CoV-2/FLU/RSV testing.  Fact Sheet for Patients: bloggercourse.com  Fact Sheet for Healthcare Providers: seriousbroker.it  This test is not yet approved or cleared by the [place]  [place] FDA and has been authorized for detection and/or diagnosis of SARS-CoV-2 by FDA under an Emergency Use Authorization (EUA). This EUA will remain in effect (meaning this test can be used) for the duration of the COVID-19 declaration under Section 564(b)(1) of the Act, 21 U.S.C. section 360bbb-3(b)(1), unless the authorization is terminated or revoked.     Resp Syncytial Virus by PCR NEGATIVE NEGATIVE Final    Comment: (NOTE) Fact Sheet for Patients: bloggercourse.com  Fact Sheet for Healthcare Providers: seriousbroker.it  This test is not yet approved or cleared by the [place]  FDA and has been authorized for detection and/or diagnosis of SARS-CoV-2 by FDA under an Emergency Use Authorization (EUA). This EUA will remain in effect (meaning this test can be used) for the duration of the COVID-19 declaration under Section 564(b)(1) of the Act, 21 U.S.C. section 360bbb-3(b)(1), unless the authorization is terminated or revoked.  Performed at [facility], [address] [place], [place] [address]   Blood culture (routine x 2)     Status: None   Collection Time: 07/26/24  8:16 PM   Specimen: BLOOD  Result Value Ref Range Status   Specimen Description BLOOD RIGHT ANTECUBITAL  Final   Special Requests   Final    BOTTLES DRAWN  AEROBIC AND ANAEROBIC Blood Culture results may not be optimal due to an inadequate volume of blood received in culture bottles   Culture   Final    NO GROWTH 5 DAYS Performed at [facility], [address] [place], [place] [address]    Report Status 07/31/2024 FINAL  Final  MRSA Next Gen by PCR, Nasal     Status: None   Collection Time: 07/27/24  5:00 PM   Specimen: Nasal Mucosa; Nasal Swab  Result Value Ref Range Status   MRSA by PCR Next Gen NOT DETECTED NOT DETECTED Final    Comment: (NOTE) The GeneXpert MRSA Assay (FDA approved for NASAL specimens only), is one  component of a comprehensive MRSA colonization surveillance program. It is not intended to diagnose MRSA infection nor to guide or monitor treatment for MRSA infections. Test performance is not FDA approved in patients less than 49 years old. Performed at [facility], [address] [place], [place] [address]   Gastrointestinal Panel by PCR , Stool     Status: None   Collection Time: 08/01/24  4:32 PM   Specimen: Stool  Result Value Ref Range Status   Campylobacter species NOT DETECTED NOT DETECTED Final   Plesimonas shigelloides NOT DETECTED NOT DETECTED Final   Salmonella species NOT DETECTED NOT DETECTED Final   Yersinia enterocolitica NOT DETECTED NOT DETECTED Final   Vibrio species NOT DETECTED NOT DETECTED Final   Vibrio cholerae NOT DETECTED NOT DETECTED Final   Enteroaggregative E coli (EAEC) NOT DETECTED NOT DETECTED Final   Enteropathogenic E coli (EPEC) NOT DETECTED NOT DETECTED Final   Enterotoxigenic E coli (ETEC) NOT DETECTED NOT DETECTED Final   Shiga like toxin producing E coli (STEC) NOT DETECTED NOT DETECTED Final   Shigella/Enteroinvasive E coli (EIEC) NOT DETECTED NOT DETECTED Final   Cryptosporidium NOT DETECTED NOT DETECTED Final   Cyclospora cayetanensis NOT DETECTED NOT DETECTED Final   Entamoeba histolytica NOT DETECTED NOT DETECTED Final   Giardia lamblia NOT DETECTED NOT DETECTED Final   Adenovirus F40/41 NOT DETECTED NOT DETECTED Final   Astrovirus NOT DETECTED NOT DETECTED Final   Norovirus GI/GII NOT DETECTED NOT DETECTED Final   Rotavirus A NOT DETECTED NOT DETECTED Final   Sapovirus (I, II, IV, and V) NOT DETECTED NOT DETECTED Final    Comment: Performed at [facility], [address] [place], [place] [address]    Coagulation Studies: No results for input(s): LABPROT, INR in the last 72 hours.   Urinalysis: No results for input(s): COLORURINE, LABSPEC, PHURINE, GLUCOSEU, HGBUR, BILIRUBINUR, KETONESUR,  PROTEINUR, UROBILINOGEN, NITRITE, LEUKOCYTESUR in the last 72 hours.  Invalid input(s): APPERANCEUR    Imaging: No results found.    Medications:       stroke: early stages of recovery book   Does not apply Once   acidophilus  2 capsule Oral BID   aspirin  EC  81 mg Oral Daily   calcium  acetate  1,334 mg Oral TID WC   Chlorhexidine  Gluconate Cloth  6 each Topical Q0600   ciprofloxacin  250 mg Oral BID   clopidogrel  75 mg Oral Daily   feeding supplement (NEPRO CARB STEADY)  237 mL Oral BID BM   Gerhardt's butt cream   Topical TID   guaiFENesin  600 mg Oral BID   heparin  injection (subcutaneous)  5,000 Units Subcutaneous Q8H   levothyroxine   88 mcg Oral Q0600   rOPINIRole   0.5 mg Oral BID   rosuvastatin   10 mg  Oral Daily   sertraline  50 mg Oral Daily   acetaminophen  **OR** acetaminophen , benzocaine, hydrALAZINE , ipratropium-albuterol , lidocaine -prilocaine , loperamide, magnesium hydroxide, ondansetron  **OR** ondansetron  (ZOFRAN ) IV, traZODone   Assessment/ Plan:  Cassandra [name] is a 89 y.o.  female with past medical conditions including anxiety, hypertension, RLS, neuropathy, and end-stage renal disease on hemodialysis.  Patient presents to the emergency department with weakness and confusion and has been admitted for ESRD (end stage renal disease) on dialysis (HCC) [N18.6, Z99.2] Sepsis due to pneumonia (HCC) [J18.9, A41.9] Pneumonia due to infectious organism, unspecified laterality, unspecified part of lung [J18.9] Sepsis, due to unspecified organism, unspecified whether acute organ dysfunction present (HCC) [A41.9]  [facility]/TTS/left aVF   End-stage renal disease on hemodialysis.  Receiving dialysis today, UF goal 1 L as tolerated.  Tolerating treatment well however patient tends to get restless towards the end of treatment.  Will continue to monitor.  Next treatment scheduled for Thursday.  2. Anemia of chronic kidney disease Lab Results   Component Value Date   HGB 12.7 08/02/2024    Hgb within optimal range.  No need of ESA's at this time.  3. Secondary Hyperparathyroidism: with outpatient labs: None available Lab Results  Component Value Date   CALCIUM  7.8 (L) 08/02/2024   CAION 1.11 (L) 05/25/2023   PHOS 3.3 07/31/2024   Calcium  and phosphorus within optimal range.  4. Hypertension with chronic kidney disease. Home regimen includes amlodipine  and clonidine . Blood pressure 162/69 during dialysis.   LOS: 10 Redell Nazir 11/11/202510:50 AM

## 2024-08-05 NOTE — Progress Notes (Signed)
   08/05/24 1443  Vitals  Pulse Rate 76  Resp (!) 22  Type of Weight Post-Dialysis  Oxygen Therapy  SpO2 95 %  O2 Device Room Air  Patient Activity (if Appropriate) In bed  Pulse Oximetry Type Continuous  Post Treatment  Dialyzer Clearance Lightly streaked  Hemodialysis Intake (mL) 0 mL  Tolerated HD Treatment Yes  Post-Hemodialysis Comments tx complete  AVG/AVF Arterial Site Held (minutes) 5 minutes  AVG/AVF Venous Site Held (minutes) 5 minutes  Fistula / Graft Left Forearm Arteriovenous fistula  Placement Date/Time: 05/25/23 1137   Orientation: Left  Access Location: Forearm  Access Type: Arteriovenous fistula  Site Condition No complications  Status Deaccessed  Drainage Description None

## 2024-08-05 NOTE — Progress Notes (Signed)
 OT Cancellation Note  Patient Details Name: Cassandra [name] MRN: [id number] DOB: 01-21-1927   Cancelled Treatment:    Reason Eval/Treat Not Completed: Patient at procedure or test/ unavailable;Other (comment) (patient receiving HD, will follow up as able)  [name] Clause 08/05/2024, 3:40 PM

## 2024-08-05 NOTE — Plan of Care (Signed)
  Problem: Fluid Volume: Goal: Hemodynamic stability will improve Outcome: Progressing   Problem: Clinical Measurements: Goal: Diagnostic test results will improve Outcome: Progressing Goal: Signs and symptoms of infection will decrease Outcome: Progressing   Problem: Respiratory: Goal: Ability to maintain adequate ventilation will improve Outcome: Progressing   Problem: Education: Goal: Knowledge of General Education information will improve Description: Including pain rating scale, medication(s)/side effects and non-pharmacologic comfort measures Outcome: Progressing   Problem: Activity: Goal: Risk for activity intolerance will decrease Outcome: Progressing   Problem: Nutrition: Goal: Adequate nutrition will be maintained Outcome: Progressing   Problem: Coping: Goal: Level of anxiety will decrease Outcome: Progressing   Problem: Elimination: Goal: Will not experience complications related to bowel motility Outcome: Progressing Goal: Will not experience complications related to urinary retention Outcome: Progressing   Problem: Pain Managment: Goal: General experience of comfort will improve and/or be controlled Outcome: Progressing   Problem: Skin Integrity: Goal: Risk for impaired skin integrity will decrease Outcome: Progressing   Problem: Education: Goal: Knowledge of disease and its progression will improve Outcome: Progressing Goal: Individualized Educational Video(s) Outcome: Progressing   Problem: Fluid Volume: Goal: Compliance with measures to maintain balanced fluid volume will improve Outcome: Progressing

## 2024-08-06 DIAGNOSIS — A419 Sepsis, unspecified organism: Secondary | ICD-10-CM | POA: Diagnosis not present

## 2024-08-06 DIAGNOSIS — J189 Pneumonia, unspecified organism: Secondary | ICD-10-CM | POA: Diagnosis not present

## 2024-08-06 LAB — BASIC METABOLIC PANEL WITH GFR
Anion gap: 12 (ref 5–15)
BUN: 26 mg/dL — ABNORMAL HIGH (ref 8–23)
CO2: 26 mmol/L (ref 22–32)
Calcium: 8.4 mg/dL — ABNORMAL LOW (ref 8.9–10.3)
Chloride: 96 mmol/L — ABNORMAL LOW (ref 98–111)
Creatinine, Ser: 4.66 mg/dL — ABNORMAL HIGH (ref 0.44–1.00)
GFR, Estimated: 8 mL/min — ABNORMAL LOW (ref >60)
Glucose, Bld: 62 mg/dL — ABNORMAL LOW (ref 70–99)
Potassium: 4.6 mmol/L (ref 3.5–5.1)
Sodium: 134 mmol/L — ABNORMAL LOW (ref 135–145)

## 2024-08-06 MED ORDER — AMLODIPINE BESYLATE 5 MG PO TABS: 10.0000 mg | ORAL_TABLET | Freq: Every day | ORAL | Status: DC

## 2024-08-06 MED ORDER — AMLODIPINE BESYLATE 5 MG PO TABS: 5.0000 mg | ORAL_TABLET | Freq: Every day | ORAL | Status: DC

## 2024-08-06 NOTE — Discharge Summary (Signed)
 Physician Discharge Summary   Cassandra [name]  female DOB: [birth year]  FMW:[id number]  PCP: Cleotilde Oneil FALCON, MD  Admit date: 07/26/2024 Discharge date: 08/06/2024  Admitted From: home Disposition:  SNF rehab CODE STATUS: DNR  Updated from discharge summary 08/04/2024 Discharge Instructions     Ambulatory referral to Neurology   Complete by: As directed    An appointment is requested in approximately: 8 weeks   Discharge diet:   Complete by: As directed    Renal diet.  Dys 3. - -   Discharge wound care:   Complete by: As directed    3 times daily      Cleanse perineum and labia with Vashe wound cleanser Cassandra [name]) do not rinse and allow to air dry. Apply Gerhardt's Butt Cream 3 times a day and prn soiling. [facility] Course:  For full details, please see H&P, progress notes, consult notes and ancillary notes.  Briefly,  Cassandra [name] is a 89 y.o. female with medical history significant for coronary artery disease, end-stage renal disease on hemodialysis on TTS, chronic hypoxemic respiratory failure on 2L O2 PRN, recent acute CVA, hypertension, dyslipidemia, hypothyroidism, lumbar stenosis, and type 2 diabetes mellitus, who presented to the emergency room with acute onset of fatigue and generalized weakness with altered mental status and lethargy.  Blood pressure was 79/38 with EMS.  Patient admitted to cough productive of whitish sputum    Severe sepsis due to multifocal pneumonia [facility]) -- Patient came in with altered mental status, respiratory distress, tachycardia, tachypnea, elevated lactic acid and multifocal pneumonia.  large consolidation in the left lower lobe and lingula.  -- Pulmonary consulted --s/p IV hydrocortisone stress dose 100 mg q12h for 5 doses. --pt completed 5 days of azithromycin , 8 days of zosyn, and 3 days of Ciprofloxacin  Chronic hypoxemic respiratory failure On 2L O2 PRN --Continue supplemental O2 to keep sats >=90%    Acute strokes --pt recently discharged on 07/18/24 after admission for acute stroke, and was discharged on cardiac monitor.   --during this hospitalization, pt was found to have more small acute strokes on MRI brain.  Likely embolic, however, has not found Afib or Aflutter on cardiac monitor thus far. --neuro consulted --cont current statin - ASA 81mg  daily + plavix 75mg  daily x 90 days f/b ASA 81mg  daily monotherapy after that - Outpatient referral to neurology at hospital discharge --Pt's cardiac monitor from last hospitalization was missing, so another was placed prior to discharge.  F[facility] with [facility] cardio for results.   Bilateral pleural effusions --No safe window for thoracentesis on US  --HD for fluid removal   ESRD on HD TTS AGMA  --iHD per nephrology --Patient refused labs on discharge.    Elevated troponin 2/2 demand ischemia  --No chest pain.  IV heparin  for 48 hours per Dr Fernand, completed.  Cardiology consulted, no plan for further cardiac diagnostics.   CAD --cont ASA, plavix and statin   Depression - Continue Zoloft.   Dyslipidemia -continue statin therapy.   Hypothyroidism - continue Synthroid .   Lethargy in the setting of PNA --improved after holding sedating meds    Hyperkalemia --corrected with dialysis, Refused labs on day of discharge     Leukocytosis, chronic Per onc note from Dr. Babara Nov 2024, Leukocytosis, acute on chronic. Due to Jak2 mutation positive myeloproliferative neoplasm/PV. --pt has not followed up with Dr. Babara since then.  Droxia  has been prescribed by pt's PCP. --cont Droxia   --  outpatient f/u with onc Dr. Babara.   Diarrhea Resolved  --likely due to abx.  GI path neg --received Imodium PRN while inpatient. --cont probiotic (new)  HTN --Will resume patient's amlodipine  on discharge. She did not receive her antihypertensives while hospitalized.  --Will continue to pause her clonidine  until she follows up with her PCP   Mouth  ulcers Will continue magic mouthwash for 7 days total.   Discharge Diagnoses:  Principal Problem:   Sepsis due to pneumonia [facility]) Active Problems:   NSTEMI (non-ST elevated myocardial infarction) [facility])   Essential hypertension   Hypothyroidism   Dyslipidemia   Depression   30 Day Unplanned Readmission Risk Score    Flowsheet Row ED to Hosp-Admission (Current) from 07/26/2024 in [facility] 1C MEDICAL TELEMETRY  30 Day Unplanned Readmission Risk Score (%) 27.89 Filed at 08/04/2024 0801    This score is the patient's risk of an unplanned readmission within 30 days of being discharged (0 -100%). The score is based on dignosis, age, lab data, medications, orders, and past utilization.   Low:  0-14.9   Medium: 15-21.9   High: 22-29.9   Extreme: 30 and above         Discharge Instructions:  Allergies as of 08/06/2024       Reactions   Atorvastatin Other (See Comments)   MYALGIA   Cyclobenzaprine Other (See Comments)   hallucination   Mirtazapine    Other reaction(s): Hallucination   Oxycodone-acetaminophen  Other (See Comments)   hallucination   Paroxetine Hcl Other (See Comments)   hallucination   Penicillins Swelling   Lip and orbital swelling Tolerated 3rd generation cephalosporin (CEFTRIAXONE ) between 03/05/2023 and 03/09/2023 with no documented ADRs.   Propoxyphene Other (See Comments)   hallucination   Trazodone  Other (See Comments)   hallucination        Medication List     PAUSE taking these medications    cloNIDine  0.1 MG tablet Wait to take this until your doctor or other care provider tells you to start again. Commonly known as: CATAPRES  Take 0.1 mg by mouth 2 (two) times daily.       TAKE these medications    acidophilus Caps capsule Take 2 capsules by mouth 2 (two) times daily.   ALPRAZolam  0.25 MG tablet Commonly known as: XANAX  Take 1 tablet (0.25 mg total) by mouth daily as needed.   amLODipine  5 MG  tablet Commonly known as: NORVASC  Take 1 tablet (5 mg total) by mouth daily.   aspirin  EC 81 MG tablet Take 1 tablet (81 mg total) by mouth daily for 21 days. Swallow whole.   benzocaine 10 % mucosal gel Commonly known as: ORAJEL Use as directed in the mouth or throat 3 (three) times daily as needed for mouth pain.   betamethasone valerate 0.1 % cream Commonly known as: VALISONE Apply 1 Application topically daily.   calcium  acetate 667 MG capsule Commonly known as: PHOSLO  Take 1,334 mg by mouth 3 (three) times daily.   cetirizine 10 MG tablet Commonly known as: ZYRTEC Take 1 tablet by mouth daily as needed for allergies.   clopidogrel 75 MG tablet Commonly known as: PLAVIX Take 1 tablet (75 mg total) by mouth daily.   CryoDose TA Aero Generic drug: pentafluoroprop-tetrafluoroeth Apply 1 Application topically as directed. SPRAY 1 AS DIRECTED FOR USE DURING DIALYSIS TO ACCESS   [employer] D3 25 MCG (1000 UT) capsule Generic drug: Cholecalciferol  Take 1,000 Units by mouth daily.   Droxia  300 MG capsule  Generic drug: hydroxyurea  TAKE 1 CAPSULE (300 MG TOTAL) BY MOUTH DAILY. MAY TAKE WITH FOOD TO MINIMIZE GI SIDE EFFECTS.   feeding supplement (NEPRO CARB STEADY) Liqd Take 237 mLs by mouth 2 (two) times daily between meals.   gabapentin  100 MG capsule Commonly known as: NEURONTIN  Take 1-2 capsules (100-200 mg total) by mouth 3 (three) times daily. 100 mg am 100 mg afternoon 200 mg bedtime. Home med.   Gerhardt's butt cream Crea Apply 1 Application topically 3 (three) times daily.   glucose blood test strip Use once daily. Use as instructed.   HYDROcodone -acetaminophen  5-325 MG tablet Commonly known as: NORCO/VICODIN Take 1 tablet by mouth 3 (three) times daily as needed for moderate pain (pain score 4-6) or severe pain (pain score 7-10). What changed: when to take this   hydrOXYzine 10 MG tablet Commonly known as: ATARAX Take 10 mg by mouth daily as needed.    latanoprost  0.005 % ophthalmic solution Commonly known as: XALATAN  Place 1 drop into both eyes at bedtime.   levothyroxine  88 MCG tablet Commonly known as: SYNTHROID  Take 88 mcg by mouth daily. Take on an empty stomach 30 to 60 minutes before breakfast   lidocaine -prilocaine  cream Commonly known as: EMLA  Apply 1 Application topically as needed.   magic mouthwash w/lidocaine  Soln Take 10 mLs by mouth 3 (three) times daily for 7 days. Suspension contains equal amounts of Maalox Extra Strength, nystatin, diphenhydramine  and lidocaine .   naloxone 4 MG/0.1ML Liqd nasal spray kit Commonly known as: NARCAN Place 4 mg into the nose once.   rOPINIRole  0.5 MG tablet Commonly known as: REQUIP  Take 1 tablet (0.5 mg total) by mouth in the morning and at bedtime.   rosuvastatin  10 MG tablet Commonly known as: CRESTOR  Take 1 tablet (10 mg total) by mouth daily. Reduced from 20 mg to 10 mg, due to ESRD.   sertraline 50 MG tablet Commonly known as: ZOLOFT Take 50 mg by mouth daily.   timolol  0.5 % ophthalmic gel-forming Commonly known as: TIMOPTIC -XR Place 1 drop into both eyes every morning.   traZODone  50 MG tablet Commonly known as: DESYREL  Take 1 tablet (50 mg total) by mouth at bedtime as needed for sleep. Home med. What changed:  when to take this reasons to take this additional instructions               Discharge Care Instructions  (From admission, onward)           Start     Ordered   08/04/24 0000  Discharge wound care:       Comments: 3 times daily      Cleanse perineum and labia with Vashe wound cleanser Cassandra [id number]) do not rinse and allow to air dry. Apply Gerhardt's Butt Cream 3 times a day and prn soiling. - -   08/04/24 0930             Contact information for follow-up providers     Alluri, Keller BROCKS, MD. Go in 1 week(s).   Specialty: Cardiology Contact information: [address] [place] [address] [phone]          Cleotilde Oneil FALCON, MD Follow up.   Specialty: Internal Medicine Contact information: [phone] [facility] [address] [facility] [place] [address] [phone]         Maree Jannett POUR, MD Follow up.   Specialty: Neurology Contact information: [phone] [facility] [address] [facility] [place] [address] [phone]  Contact information for after-discharge care     Destination     [facility] .   Service: Skilled Nursing Contact information: [address] [place]  [address] [phone]                     Allergies  Allergen Reactions   Atorvastatin Other (See Comments)    MYALGIA   Cyclobenzaprine Other (See Comments)    hallucination   Mirtazapine     Other reaction(s): Hallucination   Oxycodone-Acetaminophen  Other (See Comments)    hallucination   Paroxetine Hcl Other (See Comments)    hallucination   Penicillins Swelling    Lip and orbital swelling Tolerated 3rd generation cephalosporin (CEFTRIAXONE ) between 03/05/2023 and 03/09/2023 with no documented ADRs.   Propoxyphene Other (See Comments)    hallucination   Trazodone  Other (See Comments)    hallucination     The results of significant diagnostics from this hospitalization (including imaging, microbiology, ancillary and laboratory) are listed below for reference.   Consultations:   Procedures/Studies: DG Chest Port 1 View Result Date: 08/05/2024 EXAM: 1 VIEW(S) XRAY OF THE CHEST 08/05/2024 07:04:00 PM COMPARISON: Comparison with previous report dated 08/02/2024. CLINICAL HISTORY: Interstitial edema. FINDINGS: LINES, TUBES AND DEVICES: Loop recorder. LUNGS AND PLEURA: Mild vascular congestion. Bilateral central and basilar interstitial infiltrates, likely edema. Small pleural effusions. No pneumothorax. No significant change since prior study. HEART AND MEDIASTINUM: Cardiac enlargement. Calcification of  the aorta. BONES AND SOFT TISSUES: Degenerative changes in the spine and shoulders. No acute osseous abnormality. IMPRESSION: 1. Cardiomegaly with mild vascular congestion and bilateral central and basilar interstitial infiltrates, likely edema. Small pleural effusions. No significant change since the prior study. Electronically signed by: Elsie Gravely MD 08/05/2024 07:09 PM EST RP Workstation: [id number]   DG Chest Port 1 View Result Date: 08/02/2024 EXAM: 1 VIEW(S) XRAY OF THE CHEST 08/02/2024 05:41:00 PM COMPARISON: 07/30/2024 CLINICAL HISTORY: Pneumonia FINDINGS: LUNGS AND PLEURA: Small left pleural effusion, stable. Small right pleural effusion. Stable interstitial opacities bilateral. Persistent left retrocardiac consolidation is noted. HEART AND MEDIASTINUM: Cardiomegaly, unchanged. Atherosclerotic calcifications. BONES AND SOFT TISSUES: Old right rib fractures. IMPRESSION: 1. Persistent left retrocardiac consolidation. 2. Small left pleural effusion, stable, and small right pleural effusion. 3. Stable interstitial opacities bilaterally. Electronically signed by: Oneil Devonshire MD 08/02/2024 07:23 PM EST RP Workstation: [id number]   US  Venous Img Lower Bilateral (DVT) Result Date: 07/31/2024 EXAM: ULTRASOUND DUPLEX OF THE BILATERAL LOWER EXTREMITY VEINS TECHNIQUE: Duplex ultrasound using B-mode/gray scaled imaging and Doppler spectral analysis and color flow was obtained of the deep venous structures of the bilateral lower extremity. COMPARISON: 05/15/2021 and previous. CLINICAL HISTORY: 801713 Stroke (cerebrum) (HCC) 801713 Stroke (cerebrum) (HCC). FINDINGS: LEFT: The common femoral vein, femoral vein, popliteal vein, and posterior tibial vein of the left lower extremity demonstrate normal compressibility with normal color flow and spectral analysis. RIGHT: The common femoral vein, femoral vein, popliteal vein, and posterior tibial vein of the right lower extremity demonstrate normal compressibility  with normal color flow and spectral analysis. IMPRESSION: 1. No evidence of DVT. Electronically signed by: Dayne Hassell MD 07/31/2024 03:58 PM EST RP Workstation: [id number]   US  CHEST (PLEURAL EFFUSION) Result Date: 07/30/2024 INDICATION: 89 year old female presents with left pleural effusion. Received request for diagnostic and therapeutic thoracentesis. EXAM: CHEST ULTRASOUND COMPARISON:  None Available. FINDINGS: Trace left pleural effusion. No significant pocket of fluid or percutaneous window to allow safe thoracentesis. Risks outweigh the benefits. IMPRESSION: Trace  left pleural effusion with no safe window for percutaneous access. No procedure performed. Read by: Kristi Davenport, NP Electronically Signed   By: Wilkie Lent M.D.   On: 07/30/2024 16:32   MR BRAIN WO CONTRAST Result Date: 07/30/2024 EXAM: MRI BRAIN WITHOUT CONTRAST 07/30/2024 02:37:26 PM TECHNIQUE: Multiplanar multisequence MRI of the head/brain was performed without the administration of intravenous contrast. COMPARISON: None available. CLINICAL HISTORY: concern for stroke FINDINGS: BRAIN AND VENTRICLES: Multiple punctate foci of acute infarction are present within the subcortical white matter of the posterior right temporal lobe, right parietal lobe adjacent to the atrium of the right lateral ventricle, and the high posterior right parietal cortex. A 6 mm focus of acute infarction is present in the left occipital lobe on image 68 of series 5. T2 and FLAIR signal is present in the left occipital infarct and high right posterior parietal infarct. Remote lacunar infarcts are present within the right basal ganglia. Periventricular and subcortical white matter changes are present bilaterally. No intracranial hemorrhage. No mass. No midline shift. No hydrocephalus. The sella is unremarkable. Normal flow voids. The remainder of the exam is somewhat degraded by patient motion. ORBITS: Bilateral lens replacements are noted. The globes and  orbits are otherwise within normal limits. SINUSES AND MASTOIDS: No acute abnormality. BONES AND SOFT TISSUES: Normal marrow signal. No acute soft tissue abnormality. IMPRESSION: 1. Multiple punctate foci of acute infarction in the posterior right temporal lobe, right parietal lobe adjacent to the atrium of the right lateral ventricle, and high posterior right parietal cortex. T2 signal changes are present within the right parietal lesion suggesting at least a subacute time frame of multiple infarcts involving the posterior territory 2. Acute/subacute 6 mm infarct in the left occipital lobe. 3. Remote lacunar infarcts in the right basal ganglia. Electronically signed by: Lonni Necessary MD 07/30/2024 04:00 PM EST RP Workstation: [id number]   DG Chest Port 1 View Result Date: 07/30/2024 EXAM: 1 VIEW(S) XRAY OF THE CHEST 07/30/2024 12:53:00 AM COMPARISON: 07/26/2024. CLINICAL HISTORY: Pneumonia. FINDINGS: LUNGS AND PLEURA: Diffuse peripheral predominant interstitial reticulation is favored to represent sequelae of chronic lung disease possibly early pulmonary fibrosis. Superimposed mild edema not excluded. Progressive airspace consolidation within the left mid and left lower lung concerning for worsening pneumonia. Moderate left pleural effusion. No pneumothorax. HEART AND MEDIASTINUM: Cardiomegaly. Calcified aorta. BONES AND SOFT TISSUES: Old right rib fractures. IMPRESSION: 1. Progressive left mid and lower lung consolidation concerning for worsening pneumonia. 2. Moderate left pleural effusion. Electronically signed by: Waddell Calk MD 07/30/2024 06:29 AM EST RP Workstation: [id number]   ECHOCARDIOGRAM COMPLETE Result Date: 07/27/2024    ECHOCARDIOGRAM REPORT   Patient Name:   BLU MCGLAUN Alleman Date of Exam: 07/27/2024 Medical Rec #:  [id number]         Height:       61.0 in Accession #:    [id number]        Weight:       130.1 lb Date of Birth:  10/04/1926        BSA:          1.573 m Patient Age:    89  years          BP:           117/55 mmHg Patient Gender: F                 HR:           75 bpm. Exam Location:  [facility] Procedure: 2D Echo,  Color Doppler and Cardiac Doppler (Both Spectral and Color            Flow Doppler were utilized during procedure). Indications:     I21.4 NSTEMI  History:         Patient has prior history of Echocardiogram examinations. CAD,                  ESRD; Risk Factors:Hypertension.  Sonographer:     L. Thornton-Maynard Referring Phys:  [id number] [facility] [employer] [facility] Diagnosing Phys: Shelda Bruckner MD  Sonographer Comments: Global longitudinal strain was attempted. IMPRESSIONS  1. Left ventricular ejection fraction, by estimation, is 65 to 70%. The left ventricle has normal function. The left ventricle has no regional wall motion abnormalities. There is mild concentric left ventricular hypertrophy. Left ventricular diastolic parameters are consistent with Grade I diastolic dysfunction (impaired relaxation). The average left ventricular global longitudinal strain is -18.6 %. The global longitudinal strain is normal.  2. Right ventricular systolic function is normal. The right ventricular size is normal. There is moderately elevated pulmonary artery systolic pressure. The estimated right ventricular systolic pressure is 59.9 mmHg.  3. Right atrial size was mildly dilated.  4. The mitral valve is normal in structure. Trivial mitral valve regurgitation. No evidence of mitral stenosis.  5. Tricuspid valve regurgitation is moderate.  6. The aortic valve is tricuspid. There is moderate calcification of the aortic valve. Aortic valve regurgitation is mild. Aortic valve sclerosis/calcification is present, without any evidence of aortic stenosis.  7. There is Moderate (Grade III) plaque involving the aortic arch and descending aorta.  8. The inferior vena cava is normal in size with greater than 50% respiratory variability, suggesting right atrial pressure of 3 mmHg. Comparison(s): Changes from prior  study are noted. TR now moderate, elevated RVSP. FINDINGS  Left Ventricle: Left ventricular ejection fraction, by estimation, is 65 to 70%. The left ventricle has normal function. The left ventricle has no regional wall motion abnormalities. The average left ventricular global longitudinal strain is -18.6 %. Strain was performed and the global longitudinal strain is normal. The left ventricular internal cavity size was normal in size. There is mild concentric left ventricular hypertrophy. Left ventricular diastolic parameters are consistent with Grade I diastolic dysfunction (impaired relaxation). Right Ventricle: The right ventricular size is normal. No increase in right ventricular wall thickness. Right ventricular systolic function is normal. There is moderately elevated pulmonary artery systolic pressure. The tricuspid regurgitant velocity is 3.77 m/s, and with an assumed right atrial pressure of 3 mmHg, the estimated right ventricular systolic pressure is 59.9 mmHg. Left Atrium: Left atrial size was normal in size. Right Atrium: Right atrial size was mildly dilated. Pericardium: There is no evidence of pericardial effusion. Mitral Valve: The mitral valve is normal in structure. Trivial mitral valve regurgitation. No evidence of mitral valve stenosis. Tricuspid Valve: The tricuspid valve is normal in structure. Tricuspid valve regurgitation is moderate . No evidence of tricuspid stenosis. Aortic Valve: Focal moderate calcification of noncoronary cusp. The aortic valve is tricuspid. There is moderate calcification of the aortic valve. Aortic valve regurgitation is mild. Aortic regurgitation PHT measures 474 msec. Aortic valve sclerosis/calcification is present, without any evidence of aortic stenosis. Aortic valve mean gradient measures 9.0 mmHg. Aortic valve peak gradient measures 16.6 mmHg. Aortic valve area, by VTI measures 1.89 cm. Pulmonic Valve: The pulmonic valve was not well visualized. Pulmonic valve  regurgitation is trivial. No evidence of pulmonic stenosis. Aorta: The aortic root and ascending aorta are  structurally normal, with no evidence of dilitation. There is moderate (Grade III) plaque involving the aortic arch and descending aorta. Venous: The inferior vena cava is normal in size with greater than 50% respiratory variability, suggesting right atrial pressure of 3 mmHg. IAS/Shunts: The atrial septum is grossly normal.  LEFT VENTRICLE PLAX 2D LVIDd:         4.20 cm     Diastology LVIDs:         2.70 cm     LV e' medial:    5.33 cm/s LV PW:         1.40 cm     LV E/e' medial:  18.0 LV IVS:        1.40 cm     LV e' lateral:   6.20 cm/s LVOT diam:     2.00 cm     LV E/e' lateral: 15.5 LV SV:         76 LV SV Index:   49          2D Longitudinal Strain LVOT Area:     3.14 cm    2D Strain GLS (A4C):   -17.8 % LV IVRT:       77 msec     2D Strain GLS (A3C):   -21.6 %                            2D Strain GLS (A2C):   -16.5 %                            2D Strain GLS Avg:     -18.6 % LV Volumes (MOD) LV vol d, MOD A2C: 68.6 ml LV vol d, MOD A4C: 35.6 ml LV vol s, MOD A2C: 12.7 ml LV vol s, MOD A4C: 9.8 ml LV SV MOD A2C:     55.9 ml LV SV MOD A4C:     35.6 ml LV SV MOD BP:      38.9 ml RIGHT VENTRICLE             IVC RV Basal diam:  3.93 cm     IVC diam: 1.40 cm RV Mid diam:    3.00 cm RV S prime:     12.90 cm/s  PULMONARY VEINS TAPSE (M-mode): 2.6 cm      Diastolic Velocity: 51.60 cm/s                             S/D Velocity:       1.40                             Systolic Velocity:  73.90 cm/s LEFT ATRIUM             Index        RIGHT ATRIUM           Index LA diam:        2.80 cm 1.78 cm/m   RA Area:     18.00 cm LA Vol (A2C):   57.1 ml 36.29 ml/m  RA Volume:   49.60 ml  31.53 ml/m LA Vol (A4C):   32.4 ml 20.59 ml/m LA Biplane Vol: 43.4 ml 27.59 ml/m  AORTIC VALVE  PULMONIC VALVE AV Area (Vmax):    1.63 cm      PV Vmax:          1.00 m/s AV Area (Vmean):   1.78 cm      PV Peak grad:      4.0 mmHg AV Area (VTI):     1.89 cm      PR End Diast Vel: 4.30 msec AV Vmax:           204.00 cm/s AV Vmean:          140.500 cm/s AV VTI:            0.403 m AV Peak Grad:      16.6 mmHg AV Mean Grad:      9.0 mmHg LVOT Vmax:         106.00 cm/s LVOT Vmean:        79.700 cm/s LVOT VTI:          0.243 m LVOT/AV VTI ratio: 0.60 AI PHT:            474 msec  AORTA Ao Root diam: 3.00 cm Ao Asc diam:  3.30 cm MITRAL VALVE                TRICUSPID VALVE MV Area (PHT): 3.31 cm     TR Peak grad:   56.9 mmHg MV Decel Time: 229 msec     TR Vmax:        377.00 cm/s MV E velocity: 95.90 cm/s MV A velocity: 106.00 cm/s  SHUNTS MV E/A ratio:  0.90         Systemic VTI:  0.24 m                             Systemic Diam: 2.00 cm Shelda Bruckner MD Electronically signed by Shelda Bruckner MD Signature Date/Time: 07/27/2024/2:52:08 PM    Final    CT CHEST ABDOMEN PELVIS WO CONTRAST Result Date: 07/26/2024 EXAM: CT CHEST, ABDOMEN AND PELVIS WITHOUT CONTRAST 03/05/2023 09:23:51 PM TECHNIQUE: CT of the chest, abdomen and pelvis was performed without the administration of intravenous contrast. Multiplanar reformatted images are provided for review. Automated exposure control, iterative reconstruction, and/or weight based adjustment of the mA/kV was utilized to reduce the radiation dose to as low as reasonably achievable. COMPARISON: None available. CLINICAL HISTORY: Sepsis. FINDINGS: CHEST: MEDIASTINUM AND LYMPH NODES: Mild cardiomegaly. Diffuse coronary artery and aortic atherosclerosis. The central airways are clear. No mediastinal, hilar or axillary lymphadenopathy. LUNGS AND PLEURA: Consolidation in the left lower lobe and lingula and to a lesser extent right lower lobe compatible with pneumonia. No pleural effusion or pneumothorax. ABDOMEN AND PELVIS: LIVER: The liver is unremarkable. GALLBLADDER AND BILE DUCTS: Small layering gallstones within the gallbladder. No evidence of acute cholecystitis. No biliary  ductal dilatation. SPLEEN: No acute abnormality. PANCREAS: No acute abnormality. ADRENAL GLANDS: No acute abnormality. KIDNEYS, URETERS AND BLADDER: Atrophic kidneys bilaterally. Left renal mid pole cyst measures 3.5 cm. Per consensus, no follow-up is needed for simple Bosniak type 1 and 2 renal cysts, unless the patient has a malignancy history or risk factors. The urinary bladder is decompressed. No stones in the kidneys or ureters. No hydronephrosis. No perinephric or periureteral stranding. GI AND BOWEL: Stomach demonstrates no acute abnormality. Postoperative changes in the rectosigmoid colon. Scattered colonic diverticulosis, most pronounced in the left colon. There is no bowel obstruction. REPRODUCTIVE ORGANS: Prior hysterectomy. PERITONEUM AND RETROPERITONEUM: No ascites.  No free air. VASCULATURE: Aorta is normal in caliber. ABDOMINAL AND PELVIS LYMPH NODES: No lymphadenopathy. BONES AND SOFT TISSUES: Diffuse degenerative disc and facet disease throughout the thoracolumbar spine. No focal soft tissue abnormality. IMPRESSION: 1. Multifocal consolidation greatest in the left lower lobe and lingula, compatible with pneumonia. 2. Cholelithiasis without acute cholecystitis. 3. Bilateral renal atrophy. 4. Colonic diverticulosis. 5. Aortic atherosclerosis. Coronary artery disease. Electronically signed by: Franky Crease MD 07/26/2024 09:37 PM EDT RP Workstation: [id number]   CT HEAD WO CONTRAST ( ) Result Date: 07/26/2024 EXAM: CT HEAD WITHOUT CONTRAST 07/26/2024 09:23:51 PM TECHNIQUE: CT of the head was performed without the administration of intravenous contrast. Automated exposure control, iterative reconstruction, and/or weight based adjustment of the mA/kV was utilized to reduce the radiation dose to as low as reasonably achievable. COMPARISON: 05/02/2022 CLINICAL HISTORY: Headache, new onset (Age >= 51y) FINDINGS: BRAIN AND VENTRICLES: No acute hemorrhage. No evidence of acute infarct. No hydrocephalus. No  extra-axial collection. No mass effect or midline shift. There is diffuse cerebral atrophy and chronic small vessel disease throughout the deep white matter. ORBITS: No acute abnormality. SINUSES: No acute abnormality. SOFT TISSUES AND SKULL: No acute soft tissue abnormality. No skull fracture. IMPRESSION: 1. No acute intracranial abnormality. 2. Diffuse cerebral atrophy and chronic small vessel disease throughout the deep white matter. Electronically signed by: Franky Crease MD 07/26/2024 09:33 PM EDT RP Workstation: [id number]   DG Chest Portable 1 View Result Date: 07/26/2024 EXAM: 1 VIEW(S) XRAY OF THE CHEST 07/26/2024 08:43:00 PM COMPARISON: Portable chest 04/08/2024. CLINICAL HISTORY: Chest pain and lethargy with hypoxemia. Symptom onset following dialysis this morning. FINDINGS: LINES, TUBES AND DEVICES: An electronic device is superimposed over the chest centrally, and there are overlying telemetry leads. LUNGS AND PLEURA: There is dense airspace disease in the left mid to lower lung fields predominating in the periphery, most likely a consolidated pneumonia likely in the lingula as there is obscuration of the lower left heart border. There is a small pleural effusion, most likely a parapneumonic effusion. Rest of the lungs are clear of infiltrates with chronic interstitial changes. No pneumothorax. HEART AND MEDIASTINUM: The aorta is tortuous and calcified, stable mediastinum. There is mild cardiomegaly but no evidence for CHF. BONES AND SOFT TISSUES: Osteopenia. There are new mildly displaced posterolateral fractures of the right 6th and 7th ribs, age indeterminate but probably subacute with suggestion of partial healing. DISCS/DEGENERATIVE CHANGES: Moderate thoracic spondylosis. IMPRESSION: 1. Dense left mid to lower lung airspace disease most consistent with lingular pneumonia, with a small likely parapneumonic effusion. Follow-up study recommended to ensure clearing. 2. New mildly displaced  posterolateral fractures of the right 6th and 7th ribs, age indeterminate but possibly subacute. Electronically signed by: Francis Quam MD 07/26/2024 08:53 PM EDT RP Workstation: [id number]   ECHOCARDIOGRAM COMPLETE BUBBLE STUDY Result Date: 07/17/2024    ECHOCARDIOGRAM REPORT   Patient Name:   DASIA GUERRIER Bohl Date of Exam: 07/17/2024 Medical Rec #:  [id number]         Height:       61.0 in Accession #:    [id number]        Weight:       121.5 lb Date of Birth:  October 14, 1926        BSA:          1.528 m Patient Age:    89 years          BP:           170/63 mmHg Patient Gender:  F                 HR:           66 bpm. Exam Location:  [facility] Procedure: 2D Echo, Cardiac Doppler, Color Doppler and Saline Contrast Bubble            Study (Both Spectral and Color Flow Doppler were utilized during            procedure). Indications:     Stroke  History:         Patient has prior history of Echocardiogram examinations, most                  recent 03/06/2023. CAD; Risk Factors:Hypertension, Diabetes and                  Dyslipidemia. CKD, ESRD.  Sonographer:     Philomena Daring Referring Phys:  [id number] AMY N COX Diagnosing Phys: Keller Alluri IMPRESSIONS  1. Left ventricular ejection fraction, by estimation, is 60 to 65%. The left ventricle has normal function. The left ventricle has no regional wall motion abnormalities. There is mild left ventricular hypertrophy. Left ventricular diastolic parameters are consistent with Grade I diastolic dysfunction (impaired relaxation).  2. Right ventricular systolic function is normal. The right ventricular size is normal.  3. The mitral valve is normal in structure. No evidence of mitral valve regurgitation.  4. The aortic valve is tricuspid. Aortic valve regurgitation is mild. Aortic valve sclerosis is present, with no evidence of aortic valve stenosis.  5. Agitated saline contrast bubble study was negative, with no evidence of any interatrial shunt. FINDINGS  Left Ventricle: Left  ventricular ejection fraction, by estimation, is 60 to 65%. The left ventricle has normal function. The left ventricle has no regional wall motion abnormalities. The left ventricular internal cavity size was normal in size. There is  mild left ventricular hypertrophy. Left ventricular diastolic parameters are consistent with Grade I diastolic dysfunction (impaired relaxation). Right Ventricle: The right ventricular size is normal. No increase in right ventricular wall thickness. Right ventricular systolic function is normal. Left Atrium: Left atrial size was normal in size. Right Atrium: Right atrial size was normal in size. Pericardium: There is no evidence of pericardial effusion. Mitral Valve: The mitral valve is normal in structure. No evidence of mitral valve regurgitation. Tricuspid Valve: The tricuspid valve is normal in structure. Tricuspid valve regurgitation is trivial. Aortic Valve: The aortic valve is tricuspid. Aortic valve regurgitation is mild. Aortic valve sclerosis is present, with no evidence of aortic valve stenosis. Aortic valve mean gradient measures 7.0 mmHg. Aortic valve peak gradient measures 13.4 mmHg. Aortic valve area, by VTI measures 1.75 cm. Pulmonic Valve: The pulmonic valve was not well visualized. Pulmonic valve regurgitation is trivial. Aorta: The aortic root and ascending aorta are structurally normal, with no evidence of dilitation. IAS/Shunts: The atrial septum is grossly normal. Agitated saline contrast was given intravenously to evaluate for intracardiac shunting. Agitated saline contrast bubble study was negative, with no evidence of any interatrial shunt.  LEFT VENTRICLE PLAX 2D LVIDd:         4.30 cm   Diastology LVIDs:         2.80 cm   LV e' medial:    5.44 cm/s LV PW:         1.00 cm   LV E/e' medial:  12.1 LV IVS:        1.10 cm   LV e' lateral:  7.07 cm/s LVOT diam:     1.80 cm   LV E/e' lateral: 9.3 LV SV:         71 LV SV Index:   46 LVOT Area:     2.54 cm  RIGHT  VENTRICLE             IVC RV S prime:     12.50 cm/s  IVC diam: 1.70 cm TAPSE (M-mode): 2.4 cm LEFT ATRIUM             Index        RIGHT ATRIUM           Index LA diam:        2.50 cm 1.64 cm/m   RA Area:     10.60 cm LA Vol (A2C):   39.9 ml 26.11 ml/m  RA Volume:   17.20 ml  11.26 ml/m LA Vol (A4C):   22.7 ml 14.86 ml/m LA Biplane Vol: 30.7 ml 20.09 ml/m  AORTIC VALVE AV Area (Vmax):    1.63 cm AV Area (Vmean):   1.66 cm AV Area (VTI):     1.75 cm AV Vmax:           183.00 cm/s AV Vmean:          122.000 cm/s AV VTI:            0.405 m AV Peak Grad:      13.4 mmHg AV Mean Grad:      7.0 mmHg LVOT Vmax:         117.00 cm/s LVOT Vmean:        79.700 cm/s LVOT VTI:          0.279 m LVOT/AV VTI ratio: 0.69  AORTA Ao Root diam: 2.40 cm MITRAL VALVE MV Area (PHT): 2.76 cm     SHUNTS MV Decel Time: 275 msec     Systemic VTI:  0.28 m MV E velocity: 66.00 cm/s   Systemic Diam: 1.80 cm MV A velocity: 118.00 cm/s MV E/A ratio:  0.56 Keller Paterson Electronically signed by Keller Paterson Signature Date/Time: 07/17/2024/5:54:25 PM    Final    MR ANGIO HEAD WO CONTRAST Result Date: 07/16/2024 EXAM: MR Angiography Head without intravenous Contrast. 07/16/2024 06:29:44 PM TECHNIQUE: Magnetic resonance angiography images of the head without intravenous contrast. Multiplanar 2D and 3D reformatted images are provided for review. COMPARISON: None provided. CLINICAL HISTORY: Neuro deficit, acute, stroke suspected; acute/subacute embolic infarcts on MRI. Neurology consulted for acute ischemic stroke, favored to be cardioembolic based on involvement of multiple vascular distributions. Patient will be seen in formal consultation tomorrow. In the meantime I have reviewed her chart and added on orders for MRA head wo, carotid US , and plavix 75mg  daily. FINDINGS: ANTERIOR CIRCULATION: No significant stenosis of the internal carotid arteries. No significant stenosis of the anterior cerebral arteries. No significant stenosis of  the middle cerebral arteries. No aneurysm. POSTERIOR CIRCULATION: Limited visualization of the right vertebral artery V4 segment, which appears to be occluded. The left vertebral artery V4 segment is normal. No significant stenosis of the posterior cerebral arteries. No significant stenosis of the basilar artery. No aneurysm. IMPRESSION: 1. 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 Electronically signed by: Franky Stanford MD 07/16/2024 07:40 PM EDT RP Workstation: [id number]   MR Cervical Spine Wo Contrast Result Date: 07/16/2024 EXAM: MRI CERVICAL SPINE WITHOUT CONTRAST 07/16/2024 08:38:03 AM TECHNIQUE: Multiplanar multisequence MRI of the cervical spine was performed.  COMPARISON: Cervical spine CT 02/13/2022 and MRI 11/28/2021. CLINICAL HISTORY: Cervical radiculopathy, left arm weakness, numbness/tingling, history of dialysis. FINDINGS: LIMITATIONS: The examination is motion degraded, severely so on axial images. The patient would not tolerate any attempts at repeat imaging. BONES AND ALIGNMENT: Chronic borderline widening of the atlantodental interval measuring 3 mm, unchanged from the prior MRI. Normal alignment elsewhere. No acute fracture or suspicious marrow lesion in the cervical spine. T5 compression fracture with severe anterior vertebral body height loss and mild to moderate marrow edema, new from a 03/05/2023 chest CT. SPINAL CORD: Grossly normal cervical spinal cord signal within limitations of motion artifact. SOFT TISSUES: No paraspinal mass. DISC LEVELS: Mild cervical spondylosis is similar to the prior MRI with preserved disc space heights. A chronic left paracentral disc protrusion at C2-C3 results in slight left ventral cord flattening. No significant cervical spinal canal stenosis is present. Mild to moderate multilevel neural foraminal stenosis is grossly similar to the prior MRI with assessment limited by motion. IMPRESSION: 1. Motion  degraded examination without an acute finding in the cervical spine. 2. T5 compression fracture with severe anterior height loss and mild to moderate marrow edema, new from 2024 and potentially subacute. 3. Mild cervical spondylosis without significant spinal stenosis. Electronically signed by: Dasie Hamburg MD 07/16/2024 09:29 AM EDT RP Workstation: [id number]   MR BRAIN WO CONTRAST Result Date: 07/16/2024 EXAM: MRI BRAIN WITHOUT CONTRAST 07/16/2024 08:38:03 AM TECHNIQUE: Multiplanar multisequence MRI of the head/brain was performed without the administration of intravenous contrast. COMPARISON: Head CT 05/02/2022 and MRI 10/12/2021. CLINICAL HISTORY: Neuro deficit, acute, stroke suspected; L arm weakness started yesterday. Patient receives dialysis. FINDINGS: The examination is mildly to moderately motion degraded. BRAIN AND VENTRICLES: There is a 1.6 cm focus of cortically based restricted diffusion and T2 hyperintensity in the posteroinferior right occipital lobe, and there is a 1 cm focus of milder diffusion weighted signal abnormality and T2 hyperintensity in the right temporal lobe. Additional punctate foci of mildly restricted diffusion are present in the right occipital lobe, left temporal lobe, left caudate head, posterior right insula, and posterior right frontal lobe. No intracranial hemorrhage, mass, hydrocephalus, midline shift, or extra axial fluid collection is identified. There is mild cerebral atrophy. Patchy T2 hyperintensities in the cerebral white matter bilaterally have mildly progressed from the prior MRI and are nonspecific but compatible with mild chronic small vessel ischemic disease. A focus of susceptibility in the right sylvian fissure corresponds to likely vascular calcification on CT. Major intracranial vascular flow voids are preserved. ORBITS: Bilateral cataract extraction. SINUSES AND MASTOIDS: No acute abnormality. BONES AND SOFT TISSUES: Normal marrow signal. No acute soft  tissue abnormality. IMPRESSION: 1. Scattered small foci of diffusion abnormality in both cerebral hemispheres suspicious for acute to subacute embolic infarcts. 2. Mild chronic small vessel ischemic disease. Electronically signed by: Dasie Hamburg MD 07/16/2024 09:15 AM EDT RP Workstation: [id number]      Labs: BNP (last 3 results) Recent Labs    08/24/23 1332 04/08/24 0605  BNP 429.9* 905.4*   Basic Metabolic Panel: Recent Labs  Lab 07/31/24 0411 08/01/24 0454 08/02/24 0606  NA 138 138 136  K 4.4 3.8 4.0  CL 101 98 97*  CO2 24 24 19*  GLUCOSE 89 110* 55*  BUN 67* 40* 52*  CREATININE 6.04* 4.46* 6.00*  CALCIUM  7.9* 8.4* 7.8*  MG 2.4 2.2 2.2  PHOS 3.3  --   --    Liver Function Tests: No results for input(s): AST, ALT, ALKPHOS, BILITOT, PROT,  ALBUMIN  in the last 168 hours. No results for input(s): LIPASE, AMYLASE in the last 168 hours. No results for input(s): AMMONIA in the last 168 hours. CBC: Recent Labs  Lab 07/31/24 0930 08/01/24 2340 08/02/24 0606 08/02/24 1328  WBC 17.8* 23.9* 21.4* 23.1*  HGB 12.3 13.1 12.5 12.7  HCT 38.8 41.5 38.4 38.2  MCV 98.0 97.0 97.5 95.5  PLT 675* 717* 662* 682*   Cardiac Enzymes: No results for input(s): CKTOTAL, CKMB, CKMBINDEX, TROPONINI in the last 168 hours. BNP: Invalid input(s): POCBNP CBG: Recent Labs  Lab 07/31/24 1252 08/04/24 2115  GLUCAP 76 71   D-Dimer No results for input(s): DDIMER in the last 72 hours. Hgb A1c No results for input(s): HGBA1C in the last 72 hours. Lipid Profile No results for input(s): CHOL, HDL, LDLCALC, TRIG, CHOLHDL, LDLDIRECT in the last 72 hours. Thyroid function studies No results for input(s): TSH, T4TOTAL, T3FREE, THYROIDAB in the last 72 hours.  Invalid input(s): FREET3 Anemia work up No results for input(s): VITAMINB12, FOLATE, FERRITIN, TIBC, IRON, RETICCTPCT in the last 72 hours. Urinalysis    Component Value  Date/Time   COLORURINE STRAW (A) 12/12/2021 1702   APPEARANCEUR CLEAR (A) 12/12/2021 1702   LABSPEC 1.005 12/12/2021 1702   PHURINE 7.0 12/12/2021 1702   GLUCOSEU 50 (A) 12/12/2021 1702   HGBUR MODERATE (A) 12/12/2021 1702   BILIRUBINUR NEGATIVE 12/12/2021 1702   KETONESUR NEGATIVE 12/12/2021 1702   PROTEINUR 100 (A) 12/12/2021 1702   NITRITE NEGATIVE 12/12/2021 1702   LEUKOCYTESUR SMALL (A) 12/12/2021 1702   Sepsis Labs Recent Labs  Lab 07/31/24 0930 08/01/24 2340 08/02/24 0606 08/02/24 1328  WBC 17.8* 23.9* 21.4* 23.1*   Microbiology Recent Results (from the past 240 hours)  MRSA Next Gen by PCR, Nasal     Status: None   Collection Time: 07/27/24  5:00 PM   Specimen: Nasal Mucosa; Nasal Swab  Result Value Ref Range Status   MRSA by PCR Next Gen NOT DETECTED NOT DETECTED Final    Comment: (NOTE) The GeneXpert MRSA Assay (FDA approved for NASAL specimens only), is one component of a comprehensive MRSA colonization surveillance program. It is not intended to diagnose MRSA infection nor to guide or monitor treatment for MRSA infections. Test performance is not FDA approved in patients less than 68 years old. Performed at [facility], [address] [place], [place] [address]   Gastrointestinal Panel by PCR , Stool     Status: None   Collection Time: 08/01/24  4:32 PM   Specimen: Stool  Result Value Ref Range Status   Campylobacter species NOT DETECTED NOT DETECTED Final   Plesimonas shigelloides NOT DETECTED NOT DETECTED Final   Salmonella species NOT DETECTED NOT DETECTED Final   Yersinia enterocolitica NOT DETECTED NOT DETECTED Final   Vibrio species NOT DETECTED NOT DETECTED Final   Vibrio cholerae NOT DETECTED NOT DETECTED Final   Enteroaggregative E coli (EAEC) NOT DETECTED NOT DETECTED Final   Enteropathogenic E coli (EPEC) NOT DETECTED NOT DETECTED Final   Enterotoxigenic E coli (ETEC) NOT DETECTED NOT DETECTED Final   Shiga like toxin producing E  coli (STEC) NOT DETECTED NOT DETECTED Final   Shigella/Enteroinvasive E coli (EIEC) NOT DETECTED NOT DETECTED Final   Cryptosporidium NOT DETECTED NOT DETECTED Final   Cyclospora cayetanensis NOT DETECTED NOT DETECTED Final   Entamoeba histolytica NOT DETECTED NOT DETECTED Final   Giardia lamblia NOT DETECTED NOT DETECTED Final   Adenovirus F40/41 NOT DETECTED NOT DETECTED Final  Astrovirus NOT DETECTED NOT DETECTED Final   Norovirus GI/GII NOT DETECTED NOT DETECTED Final   Rotavirus A NOT DETECTED NOT DETECTED Final   Sapovirus (I, II, IV, and V) NOT DETECTED NOT DETECTED Final    Comment: Performed at [facility], [address] [place], [place] [address]     Total time spend on discharging this patient, including the last patient exam, discussing the hospital stay, instructions for ongoing care as it relates to all pertinent caregivers, as well as preparing the medical discharge records, prescriptions, and/or referrals as applicable, is 45 minutes.    Alban Pepper, MD  [facility] 08/06/2024, 12:00 PM

## 2024-08-06 NOTE — Progress Notes (Signed)
 [facility]  ROUNDING NOTE   Subjective:   Cassandra [name] is a 89 year old female with past medical conditions including anxiety, hypertension, RLS, neuropathy, and end-stage renal disease on hemodialysis.  Patient presents to the emergency department with weakness and confusion and has been admitted for ESRD (end stage renal disease) on dialysis (HCC) [N18.6, Z99.2] Sepsis due to pneumonia (HCC) [J18.9, A41.9] Pneumonia due to infectious organism, unspecified laterality, unspecified part of lung [J18.9] Sepsis, due to unspecified organism, unspecified whether acute organ dysfunction present [facility]) [A41.9]  Patient is known to our practice and receives dialysis at [facility] on a TTS, supervised by Dr Marcelino.    Update  Patient seen sitting up in bed Alert, mentation not at baseline 2L [place]   Objective:  Vital signs in last 24 hours:  Temp:  [97.6 F (36.4 C)-99.4 F (37.4 C)] 99.2 F (37.3 C) (11/12 0814) Pulse Rate:  [29-78] 77 (11/12 0814) Resp:  [15-26] 16 (11/12 0814) BP: (132-164)/(51-77) 132/60 (11/12 0814) SpO2:  [79 %-100 %] 94 % (11/12 0814)  Weight change:  Filed Weights   08/02/24 1155 08/02/24 1230 08/05/24 1009  Weight: 53.1 kg 53.1 kg 53.7 kg    Intake/Output: I/O last 3 completed shifts: In: 180 [P.O.:180] Out: -    Intake/Output this shift:  No intake/output data recorded.  Physical Exam: General: NAD  Head: Normocephalic, atraumatic. Moist oral mucosal membranes  Eyes: Anicteric  Lungs:  Diminished, [place] O2  Heart: Regular rate and rhythm  Abdomen:  Soft, nontender  Extremities: No peripheral edema.  Neurologic: Awake and alert, oriented to self  Skin: Warm,dry, no rash  Access: Left upper aVF    Basic Metabolic Panel: Recent Labs  Lab 07/31/24 0411 08/01/24 0454 08/02/24 0606  NA 138 138 136  K 4.4 3.8 4.0  CL 101 98 97*  CO2 24 24 19*  GLUCOSE 89 110* 55*  BUN 67* 40* 52*  CREATININE 6.04* 4.46* 6.00*   CALCIUM  7.9* 8.4* 7.8*  MG 2.4 2.2 2.2  PHOS 3.3  --   --     Liver Function Tests: No results for input(s): AST, ALT, ALKPHOS, BILITOT, PROT, ALBUMIN  in the last 168 hours.  No results for input(s): LIPASE, AMYLASE in the last 168 hours. No results for input(s): AMMONIA in the last 168 hours.  CBC: Recent Labs  Lab 07/31/24 0930 08/01/24 2340 08/02/24 0606 08/02/24 1328  WBC 17.8* 23.9* 21.4* 23.1*  HGB 12.3 13.1 12.5 12.7  HCT 38.8 41.5 38.4 38.2  MCV 98.0 97.0 97.5 95.5  PLT 675* 717* 662* 682*    Cardiac Enzymes: No results for input(s): CKTOTAL, CKMB, CKMBINDEX, TROPONINI in the last 168 hours.  BNP: Invalid input(s): POCBNP  CBG: Recent Labs  Lab 07/31/24 1252 08/04/24 2115  GLUCAP 76 71    Microbiology: Results for orders placed or performed during the hospital encounter of 07/26/24  Blood culture (routine x 2)     Status: None   Collection Time: 07/26/24  8:11 PM   Specimen: BLOOD  Result Value Ref Range Status   Specimen Description BLOOD RIGHT ANTECUBITAL  Final   Special Requests   Final    BOTTLES DRAWN AEROBIC AND ANAEROBIC Blood Culture adequate volume   Culture   Final    NO GROWTH 5 DAYS Performed at [facility], [address], [place], [place] [address]    Report Status 07/31/2024 FINAL  Final  Resp panel by RT-PCR (RSV, Flu A&B, Covid) Anterior Nasal Swab  Status: None   Collection Time: 07/26/24  8:15 PM   Specimen: Anterior Nasal Swab  Result Value Ref Range Status   SARS Coronavirus 2 by RT PCR NEGATIVE NEGATIVE Final    Comment: (NOTE) SARS-CoV-2 target nucleic acids are NOT DETECTED.  The SARS-CoV-2 RNA is generally detectable in upper respiratory specimens during the acute phase of infection. The lowest concentration of SARS-CoV-2 viral copies this assay can detect is 138 copies/mL. A negative result does not preclude SARS-Cov-2 infection and should not be used as the sole basis for  treatment or other patient management decisions. A negative result may occur with  improper specimen collection/handling, submission of specimen other than nasopharyngeal swab, presence of viral mutation(s) within the areas targeted by this assay, and inadequate number of viral copies(<138 copies/mL). A negative result must be combined with clinical observations, patient history, and epidemiological information. The expected result is Negative.  Fact Sheet for Patients:  bloggercourse.com  Fact Sheet for Healthcare Providers:  seriousbroker.it  This test is no t yet approved or cleared by the [place]  FDA and  has been authorized for detection and/or diagnosis of SARS-CoV-2 by FDA under an Emergency Use Authorization (EUA). This EUA will remain  in effect (meaning this test can be used) for the duration of the COVID-19 declaration under Section 564(b)(1) of the Act, 21 U.S.C.section 360bbb-3(b)(1), unless the authorization is terminated  or revoked sooner.       Influenza A by PCR NEGATIVE NEGATIVE Final   Influenza B by PCR NEGATIVE NEGATIVE Final    Comment: (NOTE) The Xpert Xpress SARS-CoV-2/FLU/RSV plus assay is intended as an aid in the diagnosis of influenza from Nasopharyngeal swab specimens and should not be used as a sole basis for treatment. Nasal washings and aspirates are unacceptable for Xpert Xpress SARS-CoV-2/FLU/RSV testing.  Fact Sheet for Patients: bloggercourse.com  Fact Sheet for Healthcare Providers: seriousbroker.it  This test is not yet approved or cleared by the [place]  FDA and has been authorized for detection and/or diagnosis of SARS-CoV-2 by FDA under an Emergency Use Authorization (EUA). This EUA will remain in effect (meaning this test can be used) for the duration of the COVID-19 declaration under Section 564(b)(1) of the Act, 21  U.S.C. section 360bbb-3(b)(1), unless the authorization is terminated or revoked.     Resp Syncytial Virus by PCR NEGATIVE NEGATIVE Final    Comment: (NOTE) Fact Sheet for Patients: bloggercourse.com  Fact Sheet for Healthcare Providers: seriousbroker.it  This test is not yet approved or cleared by the [place]  FDA and has been authorized for detection and/or diagnosis of SARS-CoV-2 by FDA under an Emergency Use Authorization (EUA). This EUA will remain in effect (meaning this test can be used) for the duration of the COVID-19 declaration under Section 564(b)(1) of the Act, 21 U.S.C. section 360bbb-3(b)(1), unless the authorization is terminated or revoked.  Performed at [facility], [address] [place], [place] [address]   Blood culture (routine x 2)     Status: None   Collection Time: 07/26/24  8:16 PM   Specimen: BLOOD  Result Value Ref Range Status   Specimen Description BLOOD RIGHT ANTECUBITAL  Final   Special Requests   Final    BOTTLES DRAWN AEROBIC AND ANAEROBIC Blood Culture results may not be optimal due to an inadequate volume of blood received in culture bottles   Culture   Final    NO GROWTH 5 DAYS Performed at [facility], [address], [place], [place]  [address]    Report Status 07/31/2024 FINAL  Final  MRSA Next Gen by PCR, Nasal     Status: None   Collection Time: 07/27/24  5:00 PM   Specimen: Nasal Mucosa; Nasal Swab  Result Value Ref Range Status   MRSA by PCR Next Gen NOT DETECTED NOT DETECTED Final    Comment: (NOTE) The GeneXpert MRSA Assay (FDA approved for NASAL specimens only), is one component of a comprehensive MRSA colonization surveillance program. It is not intended to diagnose MRSA infection nor to guide or monitor treatment for MRSA infections. Test performance is not FDA approved in patients less than 36 years old. Performed at [facility], [address] [place], [place] [address]   Gastrointestinal Panel by PCR , Stool     Status: None   Collection Time: 08/01/24  4:32 PM   Specimen: Stool  Result Value Ref Range Status   Campylobacter species NOT DETECTED NOT DETECTED Final   Plesimonas shigelloides NOT DETECTED NOT DETECTED Final   Salmonella species NOT DETECTED NOT DETECTED Final   Yersinia enterocolitica NOT DETECTED NOT DETECTED Final   Vibrio species NOT DETECTED NOT DETECTED Final   Vibrio cholerae NOT DETECTED NOT DETECTED Final   Enteroaggregative E coli (EAEC) NOT DETECTED NOT DETECTED Final   Enteropathogenic E coli (EPEC) NOT DETECTED NOT DETECTED Final   Enterotoxigenic E coli (ETEC) NOT DETECTED NOT DETECTED Final   Shiga like toxin producing E coli (STEC) NOT DETECTED NOT DETECTED Final   Shigella/Enteroinvasive E coli (EIEC) NOT DETECTED NOT DETECTED Final   Cryptosporidium NOT DETECTED NOT DETECTED Final   Cyclospora cayetanensis NOT DETECTED NOT DETECTED Final   Entamoeba histolytica NOT DETECTED NOT DETECTED Final   Giardia lamblia NOT DETECTED NOT DETECTED Final   Adenovirus F40/41 NOT DETECTED NOT DETECTED Final   Astrovirus NOT DETECTED NOT DETECTED Final   Norovirus GI/GII NOT DETECTED NOT DETECTED Final   Rotavirus A NOT DETECTED NOT DETECTED Final   Sapovirus (I, II, IV, and V) NOT DETECTED NOT DETECTED Final    Comment: Performed at [facility], [address] [place], [place] [address]    Coagulation Studies: No results for input(s): LABPROT, INR in the last 72 hours.   Urinalysis: No results for input(s): COLORURINE, LABSPEC, PHURINE, GLUCOSEU, HGBUR, BILIRUBINUR, KETONESUR, PROTEINUR, UROBILINOGEN, NITRITE, LEUKOCYTESUR in the last 72 hours.  Invalid input(s): APPERANCEUR    Imaging: DG Chest Port 1 View Result Date: 08/05/2024 EXAM: 1 VIEW(S) XRAY OF THE CHEST 08/05/2024 07:04:00 PM COMPARISON: Comparison with previous report dated  08/02/2024. CLINICAL HISTORY: Interstitial edema. FINDINGS: LINES, TUBES AND DEVICES: Loop recorder. LUNGS AND PLEURA: Mild vascular congestion. Bilateral central and basilar interstitial infiltrates, likely edema. Small pleural effusions. No pneumothorax. No significant change since prior study. HEART AND MEDIASTINUM: Cardiac enlargement. Calcification of the aorta. BONES AND SOFT TISSUES: Degenerative changes in the spine and shoulders. No acute osseous abnormality. IMPRESSION: 1. Cardiomegaly with mild vascular congestion and bilateral central and basilar interstitial infiltrates, likely edema. Small pleural effusions. No significant change since the prior study. Electronically signed by: Elsie Gravely MD 08/05/2024 07:09 PM EST RP Workstation: [id number]      Medications:       stroke: early stages of recovery book   Does not apply Once   acidophilus  2 capsule Oral BID   aspirin  EC  81 mg Oral Daily   calcium  acetate  1,334 mg Oral TID WC   Chlorhexidine  Gluconate Cloth  6 each Topical Q0600  clopidogrel  75 mg Oral Daily   feeding supplement (NEPRO CARB STEADY)  237 mL Oral BID BM   Gerhardt's butt cream   Topical TID   guaiFENesin  600 mg Oral BID   heparin  injection (subcutaneous)  5,000 Units Subcutaneous Q8H   levothyroxine   88 mcg Oral Q0600   magic mouthwash w/lidocaine   10 mL Oral TID   rOPINIRole   0.5 mg Oral BID   rosuvastatin   10 mg Oral Daily   sertraline  50 mg Oral Daily   acetaminophen  **OR** acetaminophen , benzocaine, hydrALAZINE , ipratropium-albuterol , lidocaine -prilocaine , loperamide, magnesium hydroxide, ondansetron  **OR** ondansetron  (ZOFRAN ) IV, traZODone   Assessment/ Plan:  Cassandra [name] is a 89 y.o.  female with past medical conditions including anxiety, hypertension, RLS, neuropathy, and end-stage renal disease on hemodialysis.  Patient presents to the emergency department with weakness and confusion and has been admitted for ESRD (end stage renal  disease) on dialysis (HCC) [N18.6, Z99.2] Sepsis due to pneumonia (HCC) [J18.9, A41.9] Pneumonia due to infectious organism, unspecified laterality, unspecified part of lung [J18.9] Sepsis, due to unspecified organism, unspecified whether acute organ dysfunction present (HCC) [A41.9]  [facility]/TTS/left aVF   End-stage renal disease on hemodialysis.  Next treatment scheduled for Thursday. Monitoring discharge plan.  2. Anemia of chronic kidney disease Lab Results  Component Value Date   HGB 12.7 08/02/2024    No need of ESA's at this time.  3. Secondary Hyperparathyroidism: with outpatient labs: None available Lab Results  Component Value Date   CALCIUM  7.8 (L) 08/02/2024   CAION 1.11 (L) 05/25/2023   PHOS 3.3 07/31/2024   Continue calcium  acetate with meals.   4. Hypertension with chronic kidney disease. Home regimen includes amlodipine  and clonidine . Blood pressure 132/60, acceptable for this patient   Cassandra [name] 11/12/202511:00 AM

## 2024-08-06 NOTE — Progress Notes (Signed)
 Patient report given to RN Marsa. All questions are answered via phone.

## 2024-08-06 NOTE — TOC Transition Note (Signed)
 Transition of Care [facility]) - Discharge Note   Patient Details  Name: Cassandra [name] MRN: [id number] Date of Birth: [birth year]  Transition of [facility]) CM/SW Contact:  Daved JONETTA Hamilton, RN Phone Number: 08/06/2024, 1:00 PM   Clinical Narrative:     Patient will DC to: [facility] Anticipated DC date: 08/06/2024 Family notified: Elsie Bjork Transport by: Zona  Per MD patient ready for DC to [facility]. RN, patient, patient's family, and facility notified of DC. Discharge Summary sent to facility. RN given number for report. DC packet on chart. Ambulance transport requested for patient.   TOC signing off.   Final next level of care: Skilled Nursing Facility Barriers to Discharge: Barriers Resolved   Patient Goals and CMS Choice            Discharge Placement              Patient chooses bed at: Other - please specify in the comment section below: ([facility]) Patient to be transferred to facility by: [facility] Name of family member notified: Loralye Loberg Patient and family notified of of transfer: 08/06/24  Discharge Plan and Services Additional resources added to the After Visit Summary for       Post Acute Care Choice: Resumption of Svcs/PTA Provider                    HH Arranged: RN, PT [facility] Agency: [facility] Date [facility] Agency Contacted: 07/28/24   Representative spoke with at [facility] Agency: [place]  Social Drivers of Health (SDOH) Interventions SDOH Screenings   Food Insecurity: No Food Insecurity (07/27/2024)  Housing: Low Risk  (07/27/2024)  Transportation Needs: No Transportation Needs (07/27/2024)  Utilities: Not At Risk (07/27/2024)  Financial Resource Strain: Low Risk  (06/25/2024)   Received from [facility]  Social Connections: Moderately Integrated (07/27/2024)  Tobacco Use: Medium Risk (07/28/2024)     Readmission Risk Interventions    07/28/2024    2:52 PM 03/09/2023    4:09 PM  Readmission Risk Prevention  Plan  Transportation Screening Complete Complete  PCP or Specialist Appt within 3-5 Days  Complete  HRI or Home Care Consult  Complete  Social Work Consult for Recovery Care Planning/Counseling  Complete  Palliative Care Screening  Not Applicable  Medication Review Oceanographer) Complete Complete  PCP or Specialist appointment within 3-5 days of discharge Complete   HRI or Home Care Consult Complete   SW Recovery Care/Counseling Consult Complete   Palliative Care Screening Not Applicable   Skilled Nursing Facility Not Applicable

## 2024-08-06 NOTE — Plan of Care (Signed)
 Problem: Fluid Volume: Goal: Hemodynamic stability will improve Outcome: Progressing   Problem: Clinical Measurements: Goal: Diagnostic test results will improve Outcome: Progressing Goal: Signs and symptoms of infection will decrease Outcome: Progressing   Problem: Respiratory: Goal: Ability to maintain adequate ventilation will improve Outcome: Progressing   Problem: Education: Goal: Knowledge of General Education information will improve Description: Including pain rating scale, medication(s)/side effects and non-pharmacologic comfort measures Outcome: Progressing   Problem: Health Behavior/Discharge Planning: Goal: Ability to manage health-related needs will improve Outcome: Progressing   Problem: Clinical Measurements: Goal: Ability to maintain clinical measurements within normal limits will improve Outcome: Progressing Goal: Will remain free from infection Outcome: Progressing Goal: Diagnostic test results will improve Outcome: Progressing Goal: Respiratory complications will improve Outcome: Progressing Goal: Cardiovascular complication will be avoided Outcome: Progressing   Problem: Activity: Goal: Risk for activity intolerance will decrease Outcome: Progressing   Problem: Nutrition: Goal: Adequate nutrition will be maintained Outcome: Progressing   Problem: Coping: Goal: Level of anxiety will decrease Outcome: Progressing   Problem: Elimination: Goal: Will not experience complications related to bowel motility Outcome: Progressing Goal: Will not experience complications related to urinary retention Outcome: Progressing   Problem: Pain Managment: Goal: General experience of comfort will improve and/or be controlled Outcome: Progressing   Problem: Safety: Goal: Ability to remain free from injury will improve Outcome: Progressing   Problem: Skin Integrity: Goal: Risk for impaired skin integrity will decrease Outcome: Progressing   Problem:  Education: Goal: Knowledge of disease and its progression will improve Outcome: Progressing Goal: Individualized Educational Video(s) Outcome: Progressing   Problem: Fluid Volume: Goal: Compliance with measures to maintain balanced fluid volume will improve Outcome: Progressing   Problem: Health Behavior/Discharge Planning: Goal: Ability to manage health-related needs will improve Outcome: Progressing   Problem: Nutritional: Goal: Ability to make healthy dietary choices will improve Outcome: Progressing   Problem: Clinical Measurements: Goal: Complications related to the disease process, condition or treatment will be avoided or minimized Outcome: Progressing   Problem: Education: Goal: Knowledge of disease or condition will improve Outcome: Progressing Goal: Knowledge of the prescribed therapeutic regimen will improve Outcome: Progressing Goal: Individualized Educational Video(s) Outcome: Progressing   Problem: Activity: Goal: Ability to tolerate increased activity will improve Outcome: Progressing Goal: Will verbalize the importance of balancing activity with adequate rest periods Outcome: Progressing   Problem: Respiratory: Goal: Ability to maintain a clear airway will improve Outcome: Progressing Goal: Levels of oxygenation will improve Outcome: Progressing Goal: Ability to maintain adequate ventilation will improve Outcome: Progressing   Problem: Education: Goal: Ability to describe self-care measures that may prevent or decrease complications (Diabetes Survival Skills Education) will improve Outcome: Progressing Goal: Individualized Educational Video(s) Outcome: Progressing   Problem: Coping: Goal: Ability to adjust to condition or change in health will improve Outcome: Progressing   Problem: Fluid Volume: Goal: Ability to maintain a balanced intake and output will improve Outcome: Progressing   Problem: Health Behavior/Discharge Planning: Goal:  Ability to identify and utilize available resources and services will improve Outcome: Progressing Goal: Ability to manage health-related needs will improve Outcome: Progressing   Problem: Metabolic: Goal: Ability to maintain appropriate glucose levels will improve Outcome: Progressing   Problem: Nutritional: Goal: Maintenance of adequate nutrition will improve Outcome: Progressing Goal: Progress toward achieving an optimal weight will improve Outcome: Progressing   Problem: Skin Integrity: Goal: Risk for impaired skin integrity will decrease Outcome: Progressing   Problem: Tissue Perfusion: Goal: Adequacy of tissue perfusion will improve Outcome: Progressing   Problem:  Education: Goal: Knowledge of disease or condition will improve Outcome: Progressing Goal: Knowledge of secondary prevention will improve (MUST DOCUMENT ALL) Outcome: Progressing Goal: Knowledge of patient specific risk factors will improve (DELETE if not current risk factor) Outcome: Progressing

## 2024-08-06 NOTE — Progress Notes (Signed)
 D/C order noted. Pt is d/c to SNF for rehab ([facility]). Contacted [facility] TTS of pt and d/c. Requested documents faxed to clinic.  Suzen Satchel Dialysis Navigator [phone]

## 2024-08-18 ENCOUNTER — Encounter (INDEPENDENT_AMBULATORY_CARE_PROVIDER_SITE_OTHER)

## 2024-08-18 ENCOUNTER — Ambulatory Visit (INDEPENDENT_AMBULATORY_CARE_PROVIDER_SITE_OTHER): Admitting: Vascular Surgery

## 2024-08-18 DIAGNOSIS — E440 Moderate protein-calorie malnutrition: Secondary | ICD-10-CM | POA: Insufficient documentation

## 2024-08-19 ENCOUNTER — Other Ambulatory Visit: Payer: Self-pay

## 2024-08-19 ENCOUNTER — Emergency Department

## 2024-08-19 ENCOUNTER — Observation Stay
Admission: EM | Admit: 2024-08-19 | Discharge: 2024-08-20 | Disposition: A | Discharge status: Skilled Nursing Facility | Attending: Internal Medicine | Admitting: Internal Medicine

## 2024-08-19 DIAGNOSIS — R7989 Other specified abnormal findings of blood chemistry: Secondary | ICD-10-CM

## 2024-08-19 DIAGNOSIS — Z8673 Personal history of transient ischemic attack (TIA), and cerebral infarction without residual deficits: Secondary | ICD-10-CM | POA: Insufficient documentation

## 2024-08-19 DIAGNOSIS — D75839 Thrombocytosis, unspecified: Secondary | ICD-10-CM | POA: Diagnosis not present

## 2024-08-19 DIAGNOSIS — I10 Essential (primary) hypertension: Secondary | ICD-10-CM | POA: Diagnosis present

## 2024-08-19 DIAGNOSIS — I5A Non-ischemic myocardial injury (non-traumatic): Secondary | ICD-10-CM | POA: Diagnosis present

## 2024-08-19 DIAGNOSIS — D72829 Elevated white blood cell count, unspecified: Secondary | ICD-10-CM | POA: Diagnosis not present

## 2024-08-19 DIAGNOSIS — F32A Depression, unspecified: Secondary | ICD-10-CM | POA: Diagnosis not present

## 2024-08-19 DIAGNOSIS — D45 Polycythemia vera: Secondary | ICD-10-CM | POA: Diagnosis present

## 2024-08-19 DIAGNOSIS — E1122 Type 2 diabetes mellitus with diabetic chronic kidney disease: Secondary | ICD-10-CM | POA: Insufficient documentation

## 2024-08-19 DIAGNOSIS — D649 Anemia, unspecified: Principal | ICD-10-CM

## 2024-08-19 DIAGNOSIS — E039 Hypothyroidism, unspecified: Secondary | ICD-10-CM | POA: Diagnosis not present

## 2024-08-19 DIAGNOSIS — L98429 Non-pressure chronic ulcer of back with unspecified severity: Secondary | ICD-10-CM | POA: Diagnosis present

## 2024-08-19 DIAGNOSIS — D631 Anemia in chronic kidney disease: Secondary | ICD-10-CM | POA: Diagnosis present

## 2024-08-19 DIAGNOSIS — R9431 Abnormal electrocardiogram [ECG] [EKG]: Secondary | ICD-10-CM | POA: Diagnosis present

## 2024-08-19 DIAGNOSIS — I214 Non-ST elevation (NSTEMI) myocardial infarction: Secondary | ICD-10-CM

## 2024-08-19 DIAGNOSIS — I120 Hypertensive chronic kidney disease with stage 5 chronic kidney disease or end stage renal disease: Secondary | ICD-10-CM | POA: Diagnosis not present

## 2024-08-19 DIAGNOSIS — I2510 Atherosclerotic heart disease of native coronary artery without angina pectoris: Secondary | ICD-10-CM | POA: Diagnosis present

## 2024-08-19 DIAGNOSIS — E785 Hyperlipidemia, unspecified: Secondary | ICD-10-CM | POA: Diagnosis not present

## 2024-08-19 DIAGNOSIS — Z7982 Long term (current) use of aspirin: Secondary | ICD-10-CM | POA: Insufficient documentation

## 2024-08-19 DIAGNOSIS — N186 End stage renal disease: Secondary | ICD-10-CM | POA: Insufficient documentation

## 2024-08-19 DIAGNOSIS — G2581 Restless legs syndrome: Secondary | ICD-10-CM

## 2024-08-19 DIAGNOSIS — F418 Other specified anxiety disorders: Secondary | ICD-10-CM | POA: Diagnosis present

## 2024-08-19 DIAGNOSIS — E1129 Type 2 diabetes mellitus with other diabetic kidney complication: Secondary | ICD-10-CM | POA: Diagnosis present

## 2024-08-19 DIAGNOSIS — J9601 Acute respiratory failure with hypoxia: Secondary | ICD-10-CM

## 2024-08-19 DIAGNOSIS — Z87891 Personal history of nicotine dependence: Secondary | ICD-10-CM | POA: Diagnosis not present

## 2024-08-19 DIAGNOSIS — Z79899 Other long term (current) drug therapy: Secondary | ICD-10-CM | POA: Insufficient documentation

## 2024-08-19 DIAGNOSIS — I4581 Long QT syndrome: Secondary | ICD-10-CM | POA: Insufficient documentation

## 2024-08-19 DIAGNOSIS — Z992 Dependence on renal dialysis: Secondary | ICD-10-CM

## 2024-08-19 DIAGNOSIS — D62 Acute posthemorrhagic anemia: Secondary | ICD-10-CM

## 2024-08-19 DIAGNOSIS — I639 Cerebral infarction, unspecified: Secondary | ICD-10-CM | POA: Diagnosis present

## 2024-08-19 DIAGNOSIS — F419 Anxiety disorder, unspecified: Secondary | ICD-10-CM | POA: Insufficient documentation

## 2024-08-19 DIAGNOSIS — K922 Gastrointestinal hemorrhage, unspecified: Secondary | ICD-10-CM | POA: Diagnosis not present

## 2024-08-19 DIAGNOSIS — L89159 Pressure ulcer of sacral region, unspecified stage: Secondary | ICD-10-CM | POA: Diagnosis not present

## 2024-08-19 DIAGNOSIS — J90 Pleural effusion, not elsewhere classified: Secondary | ICD-10-CM | POA: Insufficient documentation

## 2024-08-19 LAB — CBC WITH DIFFERENTIAL/PLATELET
Abs Immature Granulocytes: 0.27 K/uL — ABNORMAL HIGH (ref 0.00–0.07)
Basophils Absolute: 0.1 K/uL (ref 0.0–0.1)
Basophils Relative: 0 %
Eosinophils Absolute: 0 K/uL (ref 0.0–0.5)
Eosinophils Relative: 0 %
HCT: 21 % — ABNORMAL LOW (ref 36.0–46.0)
Hemoglobin: 6.5 g/dL — ABNORMAL LOW (ref 12.0–15.0)
Immature Granulocytes: 1 %
Lymphocytes Relative: 5 %
Lymphs Abs: 1 K/uL (ref 0.7–4.0)
MCH: 31.7 pg (ref 26.0–34.0)
MCHC: 31 g/dL (ref 30.0–36.0)
MCV: 102.4 fL — ABNORMAL HIGH (ref 80.0–100.0)
Monocytes Absolute: 0.8 K/uL (ref 0.1–1.0)
Monocytes Relative: 4 %
Neutro Abs: 19.1 K/uL — ABNORMAL HIGH (ref 1.7–7.7)
Neutrophils Relative %: 90 %
Platelets: 676 K/uL — ABNORMAL HIGH (ref 150–400)
RBC: 2.05 MIL/uL — ABNORMAL LOW (ref 3.87–5.11)
RDW: 17.1 % — ABNORMAL HIGH (ref 11.5–15.5)
WBC: 21.1 K/uL — ABNORMAL HIGH (ref 4.0–10.5)
nRBC: 0 % (ref 0.0–0.2)

## 2024-08-19 LAB — COMPREHENSIVE METABOLIC PANEL WITH GFR
ALT: 12 U/L (ref 0–44)
AST: 27 U/L (ref 15–41)
Albumin: 2.7 g/dL — ABNORMAL LOW (ref 3.5–5.0)
Alkaline Phosphatase: 53 U/L (ref 38–126)
Anion gap: 6 (ref 5–15)
BUN: 30 mg/dL — ABNORMAL HIGH (ref 8–23)
CO2: 32 mmol/L (ref 22–32)
Calcium: 8.2 mg/dL — ABNORMAL LOW (ref 8.9–10.3)
Chloride: 96 mmol/L — ABNORMAL LOW (ref 98–111)
Creatinine, Ser: 2.28 mg/dL — ABNORMAL HIGH (ref 0.44–1.00)
GFR, Estimated: 19 mL/min — ABNORMAL LOW (ref >60)
Glucose, Bld: 137 mg/dL — ABNORMAL HIGH (ref 70–99)
Potassium: 3.8 mmol/L (ref 3.5–5.1)
Sodium: 134 mmol/L — ABNORMAL LOW (ref 135–145)
Total Bilirubin: 0.2 mg/dL (ref 0.0–1.2)
Total Protein: 5.5 g/dL — ABNORMAL LOW (ref 6.5–8.1)

## 2024-08-19 LAB — FOLATE: Folate: 6.1 ng/mL (ref >5.9)

## 2024-08-19 LAB — IRON AND TIBC
Iron: 30 ug/dL (ref 28–170)
Saturation Ratios: 15 % (ref 10.4–31.8)
TIBC: 197 ug/dL — ABNORMAL LOW (ref 250–450)
UIBC: 167 ug/dL

## 2024-08-19 LAB — PROTIME-INR
INR: 1 (ref 0.8–1.2)
Prothrombin Time: 13.6 s (ref 11.4–15.2)

## 2024-08-19 LAB — RETICULOCYTES
Immature Retic Fract: 26.5 % — ABNORMAL HIGH (ref 2.3–15.9)
RBC.: 2.04 MIL/uL — ABNORMAL LOW (ref 3.87–5.11)
Retic Count, Absolute: 80.2 K/uL (ref 19.0–186.0)
Retic Ct Pct: 3.9 % — ABNORMAL HIGH (ref 0.4–3.1)

## 2024-08-19 LAB — PREPARE RBC (CROSSMATCH)

## 2024-08-19 LAB — TROPONIN T, HIGH SENSITIVITY
Troponin T High Sensitivity: 113 ng/L (ref 0–19)
Troponin T High Sensitivity: 113 ng/L (ref 0–19)

## 2024-08-19 LAB — FERRITIN: Ferritin: 258 ng/mL (ref 11–307)

## 2024-08-19 MED ORDER — MELATONIN 5 MG PO TABS
5.0000 mg | ORAL_TABLET | Freq: Every evening | ORAL | Status: DC | PRN
Start: 2024-08-19 — End: 2024-08-21
  Administered 2024-08-19: 5 mg via ORAL
  Filled 2024-08-19: qty 1

## 2024-08-19 MED ORDER — SODIUM CHLORIDE 0.9% IV SOLUTION: Freq: Once | INTRAVENOUS | Status: AC

## 2024-08-19 MED ORDER — SENNOSIDES-DOCUSATE SODIUM 8.6-50 MG PO TABS
1.0000 | ORAL_TABLET | Freq: Two times a day (BID) | ORAL | Status: DC
  Administered 2024-08-19: 1 via ORAL
  Filled 2024-08-19: qty 1

## 2024-08-19 MED ORDER — PANTOPRAZOLE SODIUM 40 MG IV SOLR
80.0000 mg | Freq: Once | INTRAVENOUS | Status: AC
  Administered 2024-08-19: 80 mg via INTRAVENOUS
  Filled 2024-08-19: qty 20

## 2024-08-19 MED ORDER — PANTOPRAZOLE SODIUM 40 MG IV SOLR
40.0000 mg | Freq: Two times a day (BID) | INTRAVENOUS | Status: DC
  Administered 2024-08-20: 40 mg via INTRAVENOUS
  Filled 2024-08-19: qty 10

## 2024-08-19 MED ORDER — ALBUTEROL SULFATE (2.5 MG/3ML) 0.083% IN NEBU: 2.5000 mg | INHALATION_SOLUTION | RESPIRATORY_TRACT | Status: DC | PRN

## 2024-08-19 MED ORDER — DM-GUAIFENESIN ER 30-600 MG PO TB12: 1.0000 | ORAL_TABLET | Freq: Two times a day (BID) | ORAL | Status: DC | PRN

## 2024-08-19 MED ORDER — DIPHENHYDRAMINE HCL 50 MG/ML IJ SOLN: 12.5000 mg | Freq: Three times a day (TID) | INTRAMUSCULAR | Status: DC | PRN

## 2024-08-19 MED ORDER — HYDRALAZINE HCL 20 MG/ML IJ SOLN
5.0000 mg | INTRAMUSCULAR | Status: DC | PRN
Start: 2024-08-19 — End: 2024-08-21

## 2024-08-19 NOTE — ED Provider Notes (Signed)
 Cassandra [name] Provider Note    Event Date/Time   First MD Initiated Contact with Patient 08/19/24 1736     (approximate)   History   Hypotension   HPI  Cassandra [name] is a 89 y.o. female history of ESRD on dialysis, diabetes, CAD hypertension, presenting with anemia.  Patient denies any abdominal pain, no chest pain or shortness of breath, states that her butt is aching because of pressure sores.  Denies any nausea vomiting or diarrhea, no urinary symptoms, states that she still able to void, states that she has not been told if she had any melanotic or bloody stools.  She denies any cough.  She denies any trauma or falls.    Per independent history from EMS, found that she was anemic to 6.1.  When they got there her blood pressures were stable and then down trended to systolic 70s to 80s.  They gave her 200 to 300 cc of fluids, repeat was systolic 100s.    On independent chart review, she was admitted in November for altered mental status and lethargy secondary to multifocal pneumonia.   Physical Exam   Triage Vital Signs: ED Triage Vitals [08/19/24 1730]  Encounter Vitals Group     BP      Girls Systolic BP Percentile      Girls Diastolic BP Percentile      Boys Systolic BP Percentile      Boys Diastolic BP Percentile      Pulse      Resp      Temp      Temp src      SpO2      Weight 118 lb 6.2 oz (53.7 kg)     Height 5' 1 (1.549 m)     Head Circumference      Peak Flow      Pain Score 7     Pain Loc      Pain Education      Exclude from Growth Chart     Most recent vital signs: Vitals:   08/19/24 1731  BP: (!) 118/45  Pulse: 78  Resp: 18  Temp: 97.9 F (36.6 C)  SpO2: 98%     General: Awake, no distress.  CV:  Good peripheral perfusion.  Resp:  Normal effort.  No tachypnea or respiratory distress Abd:  No distention.  Soft nontender, she has bruising to her lower abdomen that she said was secondary to subcu heparin   injection. Other:  Rectal exam was done as chaperone, she has melanotic stool that is Hemoccult positive, did see superficial pressure sores without surrounding erythema, no purulent drainage   ED Results / Procedures / Treatments   Labs (all labs ordered are listed, but only abnormal results are displayed) Labs Reviewed  COMPREHENSIVE METABOLIC PANEL WITH GFR - Abnormal; Notable for the following components:      Result Value   Sodium 134 (*)    Chloride 96 (*)    Glucose, Bld 137 (*)    BUN 30 (*)    Creatinine, Ser 2.28 (*)    Calcium  8.2 (*)    Total Protein 5.5 (*)    Albumin  2.7 (*)    GFR, Estimated 19 (*)    All other components within normal limits  CBC WITH DIFFERENTIAL/PLATELET - Abnormal; Notable for the following components:   WBC 21.1 (*)    RBC 2.05 (*)    Hemoglobin 6.5 (*)    HCT 21.0 (*)  MCV 102.4 (*)    RDW 17.1 (*)    Platelets 676 (*)    Neutro Abs 19.1 (*)    Abs Immature Granulocytes 0.27 (*)    All other components within normal limits  TROPONIN T, HIGH SENSITIVITY - Abnormal; Notable for the following components:   Troponin T High Sensitivity 113 (*)    All other components within normal limits  PROTIME-INR  URINALYSIS, W/ REFLEX TO CULTURE (INFECTION SUSPECTED)  CBC  CBC  MAGNESIUM   APTT  TYPE AND SCREEN  PREPARE RBC (CROSSMATCH)  TROPONIN T, HIGH SENSITIVITY     EKG  EKG shows, EKG is not read as accelerated junctional rhythm but I do see P waves, appears to be sinus rhythm, rate 78, normal QRS, QTc is prolonged, baseline is wandering due to patient movement, she has T wave inversions to be 2 through V4, T wave flattening in 1, aVL, T wave changes are new compared to prior, QTc is also new compared to prior      PROCEDURES:  Critical Care performed: Yes, see critical care procedure note(s)  .Critical Care  Performed by: Waymond Lorelle Cummins, MD Authorized by: Waymond Lorelle Cummins, MD   Critical care provider statement:    Critical care  time (minutes):  40   Critical care was time spent personally by me on the following activities:  Development of treatment plan with patient or surrogate, discussions with consultants, evaluation of patient's response to treatment, examination of patient, ordering and review of laboratory studies, ordering and review of radiographic studies, ordering and performing treatments and interventions, pulse oximetry, re-evaluation of patient's condition and review of old charts    MEDICATIONS ORDERED IN ED: Medications  0.9 %  sodium chloride  infusion (Manually program via Guardrails IV Fluids) (has no administration in time range)  pantoprazole  (PROTONIX ) injection 40 mg (has no administration in time range)  diphenhydrAMINE  (BENADRYL ) injection 12.5 mg (has no administration in time range)  hydrALAZINE  (APRESOLINE ) injection 5 mg (has no administration in time range)  pantoprazole  (PROTONIX ) injection 80 mg (80 mg Intravenous Given 08/19/24 1806)     IMPRESSION / MDM / ASSESSMENT AND PLAN / ED COURSE  I reviewed the triage vital signs and the nursing notes.                              Differential diagnosis includes, but is not limited to, GI bleed, anemia, peptic ulcer disease, electrolyte derangements, atypical ACS.  Patient denies any cough or other infectious symptoms at this time.  Will get labs, type and screen, IV Protonix , she will likely need to be admitted for further management.  Patient's presentation is most consistent with acute presentation with potential threat to life or bodily function.  Independent interpretation of labs and imaging below.  Given anemia, will plan transfusion 1 unit packed RBCs, discussed with patient about recent benefits of blood transfusion and that she is agreeable to proceed.  Given her GI bleed, anemia, she will need to be admitted for further management.  Will consult hospitalist for admission.  She is admitted.    Clinical Course as of 08/19/24 1928   Tue Aug 19, 2024  8157 Independent review of labs, leukocytosis noted, appears to be chronic compared to her labs, she is anemic, troponin is elevated, she has no chest pain at this time, suspect could be demand, electrolytes not severely deranged, creatinine is elevated but is downtrending compared to prior, LFTs  are not elevated. [TT]    Clinical Course User Index [TT] Waymond Lorelle Cummins, MD     FINAL CLINICAL IMPRESSION(S) / ED DIAGNOSES   Final diagnoses:  Pressure injury of skin of sacral region, unspecified injury stage  Gastrointestinal hemorrhage, unspecified gastrointestinal hemorrhage type  Anemia, unspecified type  ESRD (end stage renal disease) on dialysis (HCC)  Elevated troponin     Rx / DC Orders   ED Discharge Orders     None        Note:  This document was prepared using Dragon voice recognition software and may include unintentional dictation errors.    Waymond Lorelle Cummins, MD 08/19/24 [phone]

## 2024-08-19 NOTE — ED Triage Notes (Signed)
 BIB EMS from [facility] for abnormal labs.  Reports checked 11/13 hgb was 11.4 today 6.1 x 2.  New to dialysis.  COmplaining of being cold.  Started falling asleep on the way here and pressure dropped.  EMS gave 300ml fluid and patient became sleepy.  Patient has known pressure sores to bottom.

## 2024-08-19 NOTE — ED Notes (Signed)
 CCMD called.

## 2024-08-19 NOTE — H&P (Signed)
 History and Physical    Cassandra [name] FMW:[id number] DOB: [birth year] DOA: 08/19/2024  Referring MD/NP/PA:   PCP: Cleotilde Oneil FALCON, MD   Patient coming from:  The patient is coming from SNF     Chief Complaint: Abnormal lab with low hemoglobin  HPI: Cassandra [name] is a 89 y.o. female with medical history significant of ESRD-HD(TTS),  HTN, HLD, DM, CAD, hypothyroidism, GI bleeding, depression with anxiety, JAK2 mutation with positive myeloproliferative neoplasm and polycythemia vera, thrombocytosis, chronic leukocytosis, pleural effusion, RLS, who presents with abnormal lab with low hemoglobin.  Patient was recently hospitalized from 11/2 - 11/12 due to sepsis secondary to multifocal pneumonia and acute stroke.  Patient was discharged on Plavix  and aspirin .  Per facility record, patient took last dose aspirin  this morning (11/25) and Plavix  yesterday afternoon at 5:48 (11/24). She had dialysis this morning per patient.  Per report, pt had lab in facility showing Hgb 6.1. her Hgb is 6.5 in ED. She had Hgb of 12.7 on 08/02/24.  Patient denies nausea vomiting or abdominal pain.  She reports constipation in the past several days.  Patient did not noticed any dark stool or rectal bleeding.  Per ED physician, patient has melena by rectal examination.  No chest pain, cough, SOB.  No fever or chills.  No symptoms of UTI.  Per report, patient had hypotension with systolic blood pressure 70-80s, received 300 cc IV fluid by EMS, blood pressure improved to SBP of 100s.  Her blood pressure is 118/45 --> 100/43 in ED.   Data reviewed independently and ED Course: pt was found to have WBC 21.1, hemoglobin 6.5, platelet 676, INR 1.0, troponin 113, potassium 3.8, bicarbonate 32, creatinine 2.28, BUN 30.  Temperature normal, heart rate 78, RR 18, oxygen saturation 98% on 2 L oxygen (patient is on as needed 2 L oxygen at home).  Patient is placed in telemetry bed for observation.  Consulted Dr. Marcelino of  renal.  Message is scheduled to to be sent to Dr. Maryruth of GI for consult at 6 AM.  CXR: 1. Left basilar atelectasis or infiltrate. 2. Aortic atherosclerosis.    EKG: I have personally reviewed.  Sinus rhythm, QTc 532, borderline left axis deviation, poor R wave progression, T wave inversion in V1-V5, and stable baseline.   Review of Systems:   General: no fevers, chills, no body weight gain, fatigue HEENT: no blurry vision, hearing changes or sore throat Respiratory: no dyspnea, coughing, wheezing CV: no chest pain, no palpitations GI: no nausea, vomiting, abdominal pain, diarrhea, has constipation GU: no dysuria, burning on urination, increased urinary frequency, hematuria .  Ext: no leg edema Neuro: no unilateral weakness, numbness, or tingling, no vision change or hearing loss Skin: no rash, no skin tear. MSK: No muscle spasm, no deformity, no limitation of range of movement in spin Heme: No easy bruising.  Travel history: No recent long distant travel.   Allergy:  Allergies  Allergen Reactions   Atorvastatin Other (See Comments)    MYALGIA   Cyclobenzaprine Other (See Comments)    hallucination   Mirtazapine     Other reaction(s): Hallucination   Oxycodone-Acetaminophen  Other (See Comments)    hallucination   Paroxetine Hcl Other (See Comments)    hallucination   Penicillins Swelling    Lip and orbital swelling Tolerated 3rd generation cephalosporin (CEFTRIAXONE ) between 03/05/2023 and 03/09/2023 with no documented ADRs.   Propoxyphene Other (See Comments)    hallucination   Trazodone  Other (See Comments)  hallucination    Past Medical History:  Diagnosis Date   Anxiety    Aortic atherosclerosis    Arthritis    osteoarthritis in hands, knees ( R torn cartilage, L meniscus removed)    Atherosclerosis of native coronary artery of native heart with unstable angina pectoris (HCC)    CAD (coronary artery disease) 1999   a.) s/p PCI (details unkown) in [place] in 1999   Campylobacter antigen positive 11/29/2014   a.) [facility] ED with N/V/D; stool culture (+)   ESRD on hemodialysis (HCC)    a.) T-Th-Sat   Glaucoma    Hypercholesterolemia    Hyperlipidemia    Hypertension    Hypothyroidism    Lumbar stenosis with neurogenic claudication    Perforation of colon as colonoscopy complication (HCC) 2010   a.) required short term colostomy with subsequent take down/reversal   RLS (restless legs syndrome)    Thrombocytosis    Type 2 diabetes mellitus with chronic kidney disease [facility])     Past Surgical History:  Procedure Laterality Date   A/V FISTULAGRAM Left 12/25/2023   Procedure: A/V Fistulagram;  Surgeon: Jama Cordella MATSU, MD;  Location: [facility] INVASIVE CV LAB;  Service: Cardiovascular;  Laterality: Left;   ABDOMINAL HYSTERECTOMY     left ovary removed   AV FISTULA PLACEMENT Left 05/25/2023   Procedure: ARTERIOVENOUS (AV) FISTULA CREATION (BRACHIALCEPHALIC);  Surgeon: Jama Cordella MATSU, MD;  Location: [facility] ORS;  Service: Vascular;  Laterality: Left;   BLEPHAROPLASTY Bilateral    CATARACT EXTRACTION W/ INTRAOCULAR LENS  IMPLANT, BILATERAL     COLONOSCOPY  2010   COLOSTOMY  03/2009   perforated colon from colonoscopy   COLOSTOMY REVERSAL  08/2009   CORONARY ANGIOPLASTY WITH STENT PLACEMENT  1999   DIALYSIS/PERMA CATHETER INSERTION N/A 03/08/2023   Procedure: DIALYSIS/PERMA CATHETER INSERTION;  Surgeon: Marea Selinda RAMAN, MD;  Location: [facility] INVASIVE CV LAB;  Service: Cardiovascular;  Laterality: N/A;   DIALYSIS/PERMA CATHETER REMOVAL N/A 09/05/2023   Procedure: DIALYSIS/PERMA CATHETER REMOVAL;  Surgeon: Marea Selinda RAMAN, MD;  Location: [facility] INVASIVE CV LAB;  Service: Cardiovascular;  Laterality: N/A;   FOOT SURGERY Bilateral    toes correction   KNEE ARTHROSCOPY Right    torn cartilage   KNEE ARTHROSCOPY W/ MENISCECTOMY Left 01/2012   TEMPORARY DIALYSIS CATHETER N/A 03/05/2023   Procedure: TEMPORARY DIALYSIS CATHETER;  Surgeon:  Marea Selinda RAMAN, MD;  Location: [facility] INVASIVE CV LAB;  Service: Cardiovascular;  Laterality: N/A;    Social History:  reports that she quit smoking about 44 years ago. Her smoking use included cigarettes. She has never used smokeless tobacco. She reports current alcohol use. She reports that she does not use drugs.  Family History:  Family History  Problem Relation Age of Onset   Hypertension Mother    Hypertension Father    Early death Father    Heart disease Father      Prior to Admission medications   Medication Sig Start Date End Date Taking? Authorizing Provider  acidophilus (RISAQUAD) CAPS capsule Take 2 capsules by mouth 2 (two) times daily. 08/04/24 09/03/24  Awanda City, MD  ALPRAZolam  (XANAX ) 0.25 MG tablet Take 1 tablet (0.25 mg total) by mouth daily as needed. 08/04/24   Awanda City, MD  amLODipine  (NORVASC ) 5 MG tablet Take 1 tablet (5 mg total) by mouth daily. 08/06/24   Franchot Novel, MD  benzocaine  (ORAJEL) 10 % mucosal gel Use as directed in the mouth or throat 3 (three) times daily  as needed for mouth pain. 08/04/24   Awanda City, MD  betamethasone valerate (VALISONE) 0.1 % cream Apply 1 Application topically daily.    [provider]  calcium  acetate (PHOSLO ) 667 MG capsule Take 1,334 mg by mouth 3 (three) times daily. 06/30/23   [provider]  cetirizine (ZYRTEC) 10 MG tablet Take 1 tablet by mouth daily as needed for allergies.    [provider]  cloNIDine  (CATAPRES ) 0.1 MG tablet Take 0.1 mg by mouth 2 (two) times daily. 06/25/24   [provider]  clopidogrel  (PLAVIX ) 75 MG tablet Take 1 tablet (75 mg total) by mouth daily. 08/04/24 11/02/24  Awanda City, MD  CRYODOSE TA AERO Apply 1 Application topically as directed. SPRAY 1 AS DIRECTED FOR USE DURING DIALYSIS TO ACCESS 07/11/24   [provider]  CVS D3 25 MCG (1000 UT) capsule Take 1,000 Units by mouth daily. 02/21/23   [provider]  DROXIA  300 MG capsule TAKE 1  CAPSULE (300 MG TOTAL) BY MOUTH DAILY. MAY TAKE WITH FOOD TO MINIMIZE GI SIDE EFFECTS. 04/23/20   Dasie Tinnie MATSU, NP  gabapentin  (NEURONTIN ) 100 MG capsule Take 1-2 capsules (100-200 mg total) by mouth 3 (three) times daily. 100 mg am 100 mg afternoon 200 mg bedtime. Home med. 04/10/24   Awanda City, MD  glucose blood test strip Use once daily. Use as instructed. 02/21/17   [provider]  HYDROcodone -acetaminophen  (NORCO/VICODIN) 5-325 MG tablet Take 1 tablet by mouth 3 (three) times daily as needed for moderate pain (pain score 4-6) or severe pain (pain score 7-10). 08/04/24   Awanda City, MD  hydrOXYzine  (ATARAX ) 10 MG tablet Take 10 mg by mouth daily as needed. 07/03/24   [provider]  latanoprost  (XALATAN ) 0.005 % ophthalmic solution Place 1 drop into both eyes at bedtime. 09/13/15   [provider]  levothyroxine  (SYNTHROID ) 88 MCG tablet Take 88 mcg by mouth daily. Take on an empty stomach 30 to 60 minutes before breakfast 12/08/14   [provider]  lidocaine -prilocaine  (EMLA ) cream Apply 1 Application topically as needed. 03/24/24   Gregg Lek, MD  naloxone [facility]) nasal spray 4 mg/0.1 mL Place 4 mg into the nose once. 06/23/24   [provider]  Nutritional Supplements (FEEDING SUPPLEMENT, NEPRO CARB STEADY,) LIQD Take 237 mLs by mouth 2 (two) times daily between meals. 03/12/23   Alexander, Natalie, DO  Nystatin (GERHARDT'S BUTT CREAM) CREA Apply 1 Application topically 3 (three) times daily. 08/04/24   Awanda City, MD  rOPINIRole  (REQUIP ) 0.5 MG tablet Take 1 tablet (0.5 mg total) by mouth in the morning and at bedtime. 08/22/23 08/16/24  Camara, Amadou, MD  rosuvastatin  (CRESTOR ) 10 MG tablet Take 1 tablet (10 mg total) by mouth daily. Reduced from 20 mg to 10 mg, due to ESRD. 07/18/24   Awanda City, MD  sertraline  (ZOLOFT ) 50 MG tablet Take 50 mg by mouth daily.    [provider]  timolol  (TIMOPTIC -XR) 0.5 % ophthalmic gel-forming Place  1 drop into both eyes every morning. 05/06/21   [provider]  traZODone  (DESYREL ) 50 MG tablet Take 1 tablet (50 mg total) by mouth at bedtime as needed for sleep. Home med. 08/04/24   Awanda City, MD    Physical Exam: Vitals:   08/19/24 1730 08/19/24 1731 08/19/24 1955 08/19/24 2010  BP:  (!) 118/45 (!) 116/46 (!) 104/43  Pulse:  78 76 71  Resp:  18 15 13   Temp:  97.9 F (36.6  C) 97.8 F (36.6 C) 98.6 F (37 C)  TempSrc:  Oral Oral Oral  SpO2:  98%  100%  Weight: 53.7 kg     Height: 5' 1 (1.549 m)      General: Not in acute distress.  Pale looking HEENT:       Eyes: PERRL, EOMI, no jaundice       ENT: No discharge from the ears and nose, no pharynx injection, no tonsillar enlargement.        Neck: No JVD, no bruit, no mass felt. Heme: No neck lymph node enlargement. Cardiac: S1/S2, RRR, No murmurs, No gallops or rubs. Respiratory: No rales, wheezing, rhonchi or rubs. GI: Soft, nondistended, nontender, no rebound pain, no organomegaly, BS present. GU: No hematuria Ext: No pitting leg edema bilaterally. 1+DP/PT pulse bilaterally. Musculoskeletal: No joint deformities, No joint redness or warmth, no limitation of ROM in spin. Skin: has sacral ulcer Neuro: Alert, following commands, cranial nerves II-XII grossly intact, moves all extremities normally.  Psych: Patient is not psychotic, no suicidal or hemocidal ideation.  Labs on Admission: I have personally reviewed following labs and imaging studies  CBC: Recent Labs  Lab 08/19/24 1757  WBC 21.1*  NEUTROABS 19.1*  HGB 6.5*  HCT 21.0*  MCV 102.4*  PLT 676*   Basic Metabolic Panel: Recent Labs  Lab 08/19/24 1757  NA 134*  K 3.8  CL 96*  CO2 32  GLUCOSE 137*  BUN 30*  CREATININE 2.28*  CALCIUM  8.2*   GFR: Estimated Creatinine Clearance: 11.4 mL/min (A) (by C-G formula based on SCr of 2.28 mg/dL (H)). Liver Function Tests: Recent Labs  Lab 08/19/24 1757  AST 27  ALT 12  ALKPHOS 53  BILITOT 0.2   PROT 5.5*  ALBUMIN  2.7*   No results for input(s): LIPASE, AMYLASE in the last 168 hours. No results for input(s): AMMONIA in the last 168 hours. Coagulation Profile: Recent Labs  Lab 08/19/24 1757  INR 1.0   Cardiac Enzymes: No results for input(s): CKTOTAL, CKMB, CKMBINDEX, TROPONINI in the last 168 hours. BNP (last 3 results) No results for input(s): PROBNP in the last 8760 hours. HbA1C: No results for input(s): HGBA1C in the last 72 hours. CBG: No results for input(s): GLUCAP in the last 168 hours. Lipid Profile: No results for input(s): CHOL, HDL, LDLCALC, TRIG, CHOLHDL, LDLDIRECT in the last 72 hours. Thyroid Function Tests: No results for input(s): TSH, T4TOTAL, FREET4, T3FREE, THYROIDAB in the last 72 hours. Anemia Panel: Recent Labs    08/19/24 1757  RETICCTPCT 3.9*   Urine analysis:    Component Value Date/Time   COLORURINE STRAW (A) 12/12/2021 1702   APPEARANCEUR CLEAR (A) 12/12/2021 1702   LABSPEC 1.005 12/12/2021 1702   PHURINE 7.0 12/12/2021 1702   GLUCOSEU 50 (A) 12/12/2021 1702   HGBUR MODERATE (A) 12/12/2021 1702   BILIRUBINUR NEGATIVE 12/12/2021 1702   KETONESUR NEGATIVE 12/12/2021 1702   PROTEINUR 100 (A) 12/12/2021 1702   NITRITE NEGATIVE 12/12/2021 1702   LEUKOCYTESUR SMALL (A) 12/12/2021 1702   Sepsis Labs: @LABRCNTIP (procalcitonin:4,lacticidven:4) )No results found for this or any previous visit (from the past 240 hours).   Radiological Exams on Admission:   Assessment/Plan Principal Problem:   Symptomatic anemia Active Problems:   GI bleed   CAD (coronary artery disease), native coronary artery   Myocardial injury   Hypertension   Polycythemia vera (HCC)   Leukocytosis   ESRD (end stage renal disease) on dialysis (HCC)   Stroke (HCC)   Dyslipidemia  Type II diabetes mellitus with renal manifestations (HCC)   Hypothyroidism   Thrombocytosis   Sacral ulcer (HCC)   Prolonged QT  interval   Depression with anxiety   Assessment and Plan:   Symptomatic anemia due to GI bleed: Hgb 12.7 on 08/02/24 --> 6.5 today. Initially hypotensive which responded to IV fluid resuscitation.  Current blood pressure 118/45 --> 100/43.  Patient did not noted any rectal bleeding or dark stool, but per ED physician, patient has melena on rectal examination, indicating GI bleeding. Message is scheduled to to be sent to Dr. Maryruth of GI for consult at 6 AM.  - will place in tele bed for obs - Hold ASA and plavix  - transfuse 1 unit of blood now - NPO after MN, pending GI consult.  - IVF: no IVF due to ESRD - Start IV pantoprazole  40 mg bid - Check anemia panel - Zofran  IV for nausea - Avoid NSAIDs and SQ heparin  - Maintain IV access (2 large bore IVs if possible). - Monitor closely and follow q6h cbc, transfuse as necessary, if Hgb<7.0 - LaB: INR, PTT and type screen  CAD (coronary artery disease), native coronary artery and myocardial injury: trop  113, no CP. In the setting of ESRD, her trop elevation is not significant. -Hold aspirin  and Plavix  - Continue Crestor  - Trend troponin  Hypertension -Hold blood pressure medications due to hypotension: Amlodipine  - IV hydralazine  as needed  Polycythemia vera, leukocytosis and thrombocytosis: Overall chronic issue.  Patient is following up with Dr. Babara of hematology - Continue home Droxia   ESRD (end stage renal disease) on dialysis (TTS): Patient had dialysis this morning.  Potassium normal. - Consulted Dr. Marcelino of renal for dialysis  Recently diagnosed stroke [facility]): -Hold aspirin  and Plavix  due to GI bleeding - Continue Crestor   Dyslipidemia -Crestor   Diet controlled type II diabetes mellitus with renal manifestations [facility]): Recent A1c 6.4, well-controlled.  *** -Check CBG every morning  Hypothyroidism -Synthroid   Sacral ulcer (HCC) -Wound care consult  QTc prolongation: QTc 532 - Check magnesium  level - Hold  Requip , trazodone , Zoloft   Depression with anxiety - Hold Requip , trazodone  and Zoloft  as above - Continue home Xanax  and hydroxyzine       DVT ppx: SCD  Code Status: DNR   Family Communication:     not done, no family member is at bed side.     Disposition Plan:  Anticipate discharge back to previous environment  Consults called:  Consulted Dr. Marcelino of renal.  Message is scheduled to to be sent to Dr. Maryruth of GI for consult at 6 AM.   Admission status and Level of care: Telemetry:    for obs     Dispo: The patient is from: SNF              Anticipated d/c is to: SNF              Anticipated d/c date is: 1 day              Patient currently is not medically stable to d/c.    Severity of Illness:  The appropriate patient status for this patient is OBSERVATION. Observation status is judged to be reasonable and necessary in order to provide the required intensity of service to ensure the patient's safety. The patient's presenting symptoms, physical exam findings, and initial radiographic and laboratory data in the context of their medical condition is felt to place them at decreased risk for further clinical deterioration. Furthermore, it  is anticipated that the patient will be medically stable for discharge from the hospital within 2 midnights of admission.        Date of Service 08/19/2024    Caleb Exon [facility]   If 7PM-7AM, please contact night-coverage [email or web address] 08/19/2024, 9:02 PM

## 2024-08-19 NOTE — ED Notes (Signed)
 Pt repositioned with a pillow on both sides

## 2024-08-20 DIAGNOSIS — J9601 Acute respiratory failure with hypoxia: Secondary | ICD-10-CM | POA: Diagnosis not present

## 2024-08-20 DIAGNOSIS — K922 Gastrointestinal hemorrhage, unspecified: Secondary | ICD-10-CM | POA: Diagnosis not present

## 2024-08-20 DIAGNOSIS — I639 Cerebral infarction, unspecified: Secondary | ICD-10-CM

## 2024-08-20 DIAGNOSIS — D649 Anemia, unspecified: Secondary | ICD-10-CM | POA: Diagnosis not present

## 2024-08-20 DIAGNOSIS — R7989 Other specified abnormal findings of blood chemistry: Secondary | ICD-10-CM

## 2024-08-20 LAB — BASIC METABOLIC PANEL WITH GFR
Anion gap: 4 — ABNORMAL LOW (ref 5–15)
BUN: 35 mg/dL — ABNORMAL HIGH (ref 8–23)
CO2: 33 mmol/L — ABNORMAL HIGH (ref 22–32)
Calcium: 7.6 mg/dL — ABNORMAL LOW (ref 8.9–10.3)
Chloride: 96 mmol/L — ABNORMAL LOW (ref 98–111)
Creatinine, Ser: 2.62 mg/dL — ABNORMAL HIGH (ref 0.44–1.00)
GFR, Estimated: 16 mL/min — ABNORMAL LOW (ref >60)
Glucose, Bld: 83 mg/dL (ref 70–99)
Potassium: 3.7 mmol/L (ref 3.5–5.1)
Sodium: 133 mmol/L — ABNORMAL LOW (ref 135–145)

## 2024-08-20 LAB — MAGNESIUM: Magnesium: 1.8 mg/dL (ref 1.7–2.4)

## 2024-08-20 LAB — CBC
HCT: 22.1 % — ABNORMAL LOW (ref 36.0–46.0)
HCT: 21.3 % — ABNORMAL LOW (ref 36.0–46.0)
HCT: 26.8 % — ABNORMAL LOW (ref 36.0–46.0)
HCT: 27.7 % — ABNORMAL LOW (ref 36.0–46.0)
HCT: 31.4 % — ABNORMAL LOW (ref 36.0–46.0)
Hemoglobin: 7.1 g/dL — ABNORMAL LOW (ref 12.0–15.0)
Hemoglobin: 6.8 g/dL — ABNORMAL LOW (ref 12.0–15.0)
Hemoglobin: 8.5 g/dL — ABNORMAL LOW (ref 12.0–15.0)
Hemoglobin: 8.9 g/dL — ABNORMAL LOW (ref 12.0–15.0)
Hemoglobin: 10 g/dL — ABNORMAL LOW (ref 12.0–15.0)
MCH: 31.6 pg (ref 26.0–34.0)
MCH: 31.2 pg (ref 26.0–34.0)
MCH: 29.9 pg (ref 26.0–34.0)
MCH: 29.5 pg (ref 26.0–34.0)
MCH: 29.9 pg (ref 26.0–34.0)
MCHC: 32.1 g/dL (ref 30.0–36.0)
MCHC: 31.9 g/dL (ref 30.0–36.0)
MCHC: 31.7 g/dL (ref 30.0–36.0)
MCHC: 32.1 g/dL (ref 30.0–36.0)
MCHC: 31.8 g/dL (ref 30.0–36.0)
MCV: 98.2 fL (ref 80.0–100.0)
MCV: 97.7 fL (ref 80.0–100.0)
MCV: 94.4 fL (ref 80.0–100.0)
MCV: 91.7 fL (ref 80.0–100.0)
MCV: 93.7 fL (ref 80.0–100.0)
Platelets: 467 K/uL — ABNORMAL HIGH (ref 150–400)
Platelets: 567 K/uL — ABNORMAL HIGH (ref 150–400)
Platelets: 549 K/uL — ABNORMAL HIGH (ref 150–400)
Platelets: 470 K/uL — ABNORMAL HIGH (ref 150–400)
Platelets: 428 K/uL — ABNORMAL HIGH (ref 150–400)
RBC: 2.25 MIL/uL — ABNORMAL LOW (ref 3.87–5.11)
RBC: 2.18 MIL/uL — ABNORMAL LOW (ref 3.87–5.11)
RBC: 2.84 MIL/uL — ABNORMAL LOW (ref 3.87–5.11)
RBC: 3.02 MIL/uL — ABNORMAL LOW (ref 3.87–5.11)
RBC: 3.35 MIL/uL — ABNORMAL LOW (ref 3.87–5.11)
RDW: 18.3 % — ABNORMAL HIGH (ref 11.5–15.5)
RDW: 18.6 % — ABNORMAL HIGH (ref 11.5–15.5)
RDW: 20.7 % — ABNORMAL HIGH (ref 11.5–15.5)
RDW: 20.9 % — ABNORMAL HIGH (ref 11.5–15.5)
RDW: 20.9 % — ABNORMAL HIGH (ref 11.5–15.5)
WBC: 16.4 K/uL — ABNORMAL HIGH (ref 4.0–10.5)
WBC: 16.4 K/uL — ABNORMAL HIGH (ref 4.0–10.5)
WBC: 15.9 K/uL — ABNORMAL HIGH (ref 4.0–10.5)
WBC: 16.1 K/uL — ABNORMAL HIGH (ref 4.0–10.5)
WBC: 15.2 K/uL — ABNORMAL HIGH (ref 4.0–10.5)
nRBC: 0 % (ref 0.0–0.2)
nRBC: 0 % (ref 0.0–0.2)
nRBC: 0 % (ref 0.0–0.2)
nRBC: 0.4 % — ABNORMAL HIGH (ref 0.0–0.2)
nRBC: 0 % (ref 0.0–0.2)

## 2024-08-20 LAB — TROPONIN T, HIGH SENSITIVITY
Troponin T High Sensitivity: 107 ng/L (ref 0–19)
Troponin T High Sensitivity: 110 ng/L (ref 0–19)

## 2024-08-20 LAB — APTT: aPTT: 26 s (ref 24–36)

## 2024-08-20 LAB — CBG MONITORING, ED: Glucose-Capillary: 75 mg/dL (ref 70–99)

## 2024-08-20 LAB — VITAMIN B12: Vitamin B-12: 421 pg/mL (ref 180–914)

## 2024-08-20 LAB — PREPARE RBC (CROSSMATCH)

## 2024-08-20 MED ORDER — LEVOTHYROXINE SODIUM 88 MCG PO TABS
88.0000 ug | ORAL_TABLET | Freq: Every day | ORAL | Status: DC
  Administered 2024-08-20: 88 ug via ORAL
  Filled 2024-08-20 (×2): qty 1

## 2024-08-20 MED ORDER — ROSUVASTATIN CALCIUM 10 MG PO TABS: 10.0000 mg | ORAL_TABLET | Freq: Every day | ORAL | Status: DC

## 2024-08-20 MED ORDER — GABAPENTIN 100 MG PO CAPS: 200.0000 mg | ORAL_CAPSULE | Freq: Every day | ORAL | Status: DC

## 2024-08-20 MED ORDER — HYDROCODONE-ACETAMINOPHEN 5-325 MG PO TABS
1.0000 | ORAL_TABLET | Freq: Four times a day (QID) | ORAL | Status: DC | PRN
  Administered 2024-08-20 (×2): 1 via ORAL
  Filled 2024-08-20 (×2): qty 1

## 2024-08-20 MED ORDER — RISAQUAD PO CAPS: 2.0000 | ORAL_CAPSULE | Freq: Two times a day (BID) | ORAL | Status: DC

## 2024-08-20 MED ORDER — ALPRAZOLAM 0.25 MG PO TABS
0.2500 mg | ORAL_TABLET | Freq: Two times a day (BID) | ORAL | Status: DC | PRN
Start: 2024-08-20 — End: 2024-08-21

## 2024-08-20 MED ORDER — GABAPENTIN 100 MG PO CAPS: 100.0000 mg | ORAL_CAPSULE | Freq: Three times a day (TID) | ORAL | Status: DC

## 2024-08-20 MED ORDER — CALCIUM ACETATE (PHOS BINDER) 667 MG PO CAPS
1334.0000 mg | ORAL_CAPSULE | Freq: Three times a day (TID) | ORAL | Status: DC
  Administered 2024-08-20: 1334 mg via ORAL
  Filled 2024-08-20 (×4): qty 2

## 2024-08-20 MED ORDER — GERHARDT'S BUTT CREAM
1.0000 | TOPICAL_CREAM | Freq: Three times a day (TID) | CUTANEOUS | Status: DC
  Administered 2024-08-20: 1 via TOPICAL
  Filled 2024-08-20 (×2): qty 60

## 2024-08-20 MED ORDER — HYDROXYUREA 300 MG PO CAPS
300.0000 mg | ORAL_CAPSULE | Freq: Every day | ORAL | Status: DC
  Filled 2024-08-20: qty 1

## 2024-08-20 MED ORDER — SODIUM CHLORIDE 0.9 % IV SOLN
Freq: Once | INTRAVENOUS | Status: AC
  Administered 2024-08-20: 10 mL/h via INTRAVENOUS

## 2024-08-20 MED ORDER — LATANOPROST 0.005 % OP SOLN
1.0000 [drp] | Freq: Every day | OPHTHALMIC | Status: DC
  Filled 2024-08-20: qty 2.5

## 2024-08-20 MED ORDER — GABAPENTIN 100 MG PO CAPS
100.0000 mg | ORAL_CAPSULE | Freq: Two times a day (BID) | ORAL | Status: DC
  Administered 2024-08-20: 100 mg via ORAL
  Filled 2024-08-20: qty 1

## 2024-08-20 NOTE — ED Notes (Signed)
 Pt repositioned on to right side

## 2024-08-20 NOTE — ED Notes (Signed)
Pt repositioned onto left side.

## 2024-08-20 NOTE — Discharge Summary (Signed)
 Physician Discharge Summary   Patient: Cassandra [name] MRN: [id number] DOB: [birth year]  Admit date:     08/19/2024  Discharge date: 08/20/24  Discharge Physician: Amaryllis Dare   PCP: Cleotilde Oneil FALCON, MD   Recommendations at discharge:  Please obtain CBC and BMP on follow-up Continue with scheduled dialysis We discontinued Plavix , restart aspirin  on Friday. Follow-up with her hematologist and primary care provider  Discharge Diagnoses: Principal Problem:   Symptomatic anemia Active Problems:   GI bleed   CAD (coronary artery disease), native coronary artery   Myocardial injury   Hypertension   Polycythemia vera (HCC)   Leukocytosis   ESRD (end stage renal disease) on dialysis (HCC)   Stroke (HCC)   Dyslipidemia   Type II diabetes mellitus with renal manifestations (HCC)   Hypothyroidism   Thrombocytosis   Sacral ulcer (HCC)   Prolonged QT interval   Depression with anxiety   Elevated troponin   Hospital Course: Partly taken from prior notes.  Cassandra [name] is a 89 y.o. female with medical history significant of ESRD-HD(TTS),  HTN, HLD, DM, CAD, hypothyroidism, GI bleeding, depression with anxiety, JAK2 mutation with positive myeloproliferative neoplasm and polycythemia vera, thrombocytosis, chronic leukocytosis, pleural effusion, RLS, who presents with abnormal lab with low hemoglobin.   Patient was recently hospitalized from 11/2 - 11/12 due to sepsis secondary to multifocal pneumonia and acute stroke.  Patient was discharged on Plavix  and aspirin .  Per facility record, patient took last dose aspirin  this morning (11/25) and Plavix  yesterday afternoon at 5:48 (11/24). She had dialysis this morning per patient.  Patient was found to have a hemoglobin of 6.5 in ED, no obvious bleeding, no melena, hematuria or hematochezia.  ED provider noted some melena by rectal exam.  Patient has also hypotensive with systolic in 7980s and received a small bolus by EMS.  On  presentation her labs were WBC 21.1, hemoglobin 6.5, INR 1.0, potassium 3.8, bicarb 32, creatinine 2.28.  CXR with left basilar atelectasis or infiltrate, EKG without any acute changes.  Patient was given 2 unit of PRBC, GI and nephrology was consulted.  11/26: Vital stable with intermittent softer blood pressure overnight, labs with WBC of 15.9, hemoglobin improved to 8.5 s/p 2 unit PRBC.  There is no active bleeding noted.  Patient was on DAPT for the past 1 month, discontinuing Plavix  and she will continue on aspirin  only.  Acute on chronic anemia likely multifactorial as patient is on dialysis and history of myeloproliferative disorder.  Palliative care was also consulted and discussed with husband they do not want any procedures. Small occult GI bleed cannot be ruled out at this time.  No obvious GI bleeding.  Mildly elevated troponin with a flat curve, likely secondary to demand ischemia.  No chest pain.  Patient need to be followed up by outpatient palliative care.  Multiple significant comorbidities and advanced age.  Patient will continue on current medications and wound care.  She need to follow-up with her providers for further assistance.     Pain control - [place]  Controlled Substance Reporting System database was reviewed. and patient was instructed, not to drive, operate heavy machinery, perform activities at heights, swimming or participation in water activities or provide baby-sitting services while on Pain, Sleep and Anxiety Medications; until their outpatient Physician has advised to do so again. Also recommended to not to take more than prescribed Pain, Sleep and Anxiety Medications.  Consultants: Gastroenterology Procedures performed: None Disposition: Skilled nursing facility Diet recommendation:  Discharge Diet Orders (From admission, onward)     Start     Ordered   08/20/24 0000  Diet - low sodium heart healthy        08/20/24 1302           Cardiac and  Carb modified diet DISCHARGE MEDICATION: Allergies as of 08/20/2024       Reactions   Atorvastatin Other (See Comments)   MYALGIA   Cyclobenzaprine Other (See Comments)   hallucination   Mirtazapine    Other reaction(s): Hallucination   Oxycodone-acetaminophen  Other (See Comments)   hallucination   Paroxetine Hcl Other (See Comments)   hallucination   Penicillins Swelling   Lip and orbital swelling Tolerated 3rd generation cephalosporin (CEFTRIAXONE ) between 03/05/2023 and 03/09/2023 with no documented ADRs.   Propoxyphene Other (See Comments)   hallucination   Trazodone  Other (See Comments)   hallucination        Medication List     PAUSE taking these medications    cloNIDine  0.1 MG tablet Wait to take this until your doctor or other care provider tells you to start again. Commonly known as: CATAPRES  Take 0.1 mg by mouth 2 (two) times daily.       STOP taking these medications    clopidogrel  75 MG tablet Commonly known as: PLAVIX    Lasix  80 MG tablet Generic drug: furosemide    metoprolol  tartrate 25 MG tablet Commonly known as: LOPRESSOR        TAKE these medications    acidophilus Caps capsule Take 2 capsules by mouth 2 (two) times daily.   albuterol  108 (90 Base) MCG/ACT inhaler Commonly known as: VENTOLIN  HFA Inhale 2 puffs into the lungs every 6 (six) hours as needed for shortness of breath.   ALPRAZolam  0.25 MG tablet Commonly known as: XANAX  Take 1 tablet (0.25 mg total) by mouth daily as needed.   amLODipine  5 MG tablet Commonly known as: NORVASC  Take 1 tablet (5 mg total) by mouth daily. What changed: Another medication with the same name was removed. Continue taking this medication, and follow the directions you see here.   aspirin  81 MG chewable tablet Chew 1 tablet by mouth daily.   azelastine  0.1 % nasal spray Commonly known as: ASTELIN  Place 2 sprays into the nose 2 (two) times daily.   benzocaine  10 % mucosal gel Commonly  known as: ORAJEL Use as directed in the mouth or throat 3 (three) times daily as needed for mouth pain.   betamethasone valerate 0.1 % cream Commonly known as: VALISONE Apply 1 Application topically daily.   calcium  acetate 667 MG capsule Commonly known as: PHOSLO  Take 1,334 mg by mouth 3 (three) times daily.   cetirizine 10 MG tablet Commonly known as: ZYRTEC Take 1 tablet by mouth daily as needed for allergies.   CryoDose TA Aero Generic drug: pentafluoroprop-tetrafluoroeth Apply 1 Application topically as directed. SPRAY 1 AS DIRECTED FOR USE DURING DIALYSIS TO ACCESS   [employer] D3 25 MCG (1000 UT) capsule Generic drug: Cholecalciferol  Take 1,000 Units by mouth daily.   Droxia  300 MG capsule Generic drug: hydroxyurea  TAKE 1 CAPSULE (300 MG TOTAL) BY MOUTH DAILY. MAY TAKE WITH FOOD TO MINIMIZE GI SIDE EFFECTS.   feeding supplement (NEPRO CARB STEADY) Liqd Take 237 mLs by mouth 2 (two) times daily between meals.   gabapentin  100 MG capsule Commonly known as: NEURONTIN  Take 1-2 capsules (100-200 mg total) by mouth 3 (three) times daily. 100 mg am 100 mg afternoon 200 mg bedtime. Home med.  Gerhardt's butt cream Crea Apply 1 Application topically 3 (three) times daily.   glucose blood test strip Use once daily. Use as instructed.   HealthyLax 17 g packet Generic drug: polyethylene glycol Take 1 packet by mouth daily.   HYDROcodone -acetaminophen  5-325 MG tablet Commonly known as: NORCO/VICODIN Take 1 tablet by mouth 3 (three) times daily as needed for moderate pain (pain score 4-6) or severe pain (pain score 7-10).   hydrOXYzine  10 MG tablet Commonly known as: ATARAX  Take 10 mg by mouth daily as needed.   latanoprost  0.005 % ophthalmic solution Commonly known as: XALATAN  Place 1 drop into both eyes at bedtime.   levothyroxine  88 MCG tablet Commonly known as: SYNTHROID  Take 88 mcg by mouth daily. Take on an empty stomach 30 to 60 minutes before breakfast    lidocaine -prilocaine  cream Commonly known as: EMLA  Apply 1 Application topically as needed.   naloxone 4 MG/0.1ML Liqd nasal spray kit Commonly known as: NARCAN Place 4 mg into the nose once.   Protonix  40 MG tablet Generic drug: pantoprazole  Take 1 tablet by mouth daily.   rOPINIRole  0.5 MG tablet Commonly known as: REQUIP  Take 1 tablet (0.5 mg total) by mouth in the morning and at bedtime.   rosuvastatin  10 MG tablet Commonly known as: CRESTOR  Take 1 tablet (10 mg total) by mouth daily. Reduced from 20 mg to 10 mg, due to ESRD.   sertraline  50 MG tablet Commonly known as: ZOLOFT  Take 50 mg by mouth daily.   timolol  0.5 % ophthalmic gel-forming Commonly known as: TIMOPTIC -XR Place 1 drop into both eyes every morning.   traZODone  50 MG tablet Commonly known as: DESYREL  Take 1 tablet (50 mg total) by mouth at bedtime as needed for sleep. Home med.               Discharge Care Instructions  (From admission, onward)           Start     Ordered   08/20/24 0000  Discharge wound care:       Comments: Cleanse perineum and labia with Vashe wound cleanser Soila [phone]) do not rinse and allow to air dry. Apply Gerhardt's Butt Cream 3 times a day and prn soiling.   08/20/24 1302            Follow-up Information     Cleotilde Oneil FALCON, MD. Schedule an appointment as soon as possible for a visit in 1 week(s).   Specialty: Internal Medicine Contact information: [address] [facility] [place] [address] [phone]                Discharge Exam: Fredricka Weights   08/19/24 1730  Weight: 53.7 kg   General.  Frail elderly lady, in no acute distress. Pulmonary.  Lungs clear bilaterally, normal respiratory effort. CV.  Regular rate and rhythm, no JVD, rub or murmur. Abdomen.  Soft, nontender, nondistended, BS positive. CNS.  Alert and oriented .  No focal neurologic deficit. Extremities.  No edema, no cyanosis,  pulses intact and symmetrical.  Condition at discharge: stable  The results of significant diagnostics from this hospitalization (including imaging, microbiology, ancillary and laboratory) are listed below for reference.   Imaging Studies: DG Chest 1 View Result Date: 08/19/2024 EXAM: 1 VIEW(S) XRAY OF THE CHEST 08/19/2024 07:04:00 PM COMPARISON: 08/05/2024 CLINICAL HISTORY: elevated wbc FINDINGS: LUNGS AND PLEURA: Left basilar atelectasis or infiltrate. No pleural effusion. No pneumothorax. HEART AND MEDIASTINUM: Aortic atherosclerosis. No acute abnormality of  the cardiac and mediastinal silhouettes. BONES AND SOFT TISSUES: No acute osseous abnormality. IMPRESSION: 1. Left basilar atelectasis or infiltrate. 2. Aortic atherosclerosis. Electronically signed by: Franky Crease MD 08/19/2024 07:16 PM EST RP Workstation: [id number]   DG Chest Port 1 View Result Date: 08/05/2024 EXAM: 1 VIEW(S) XRAY OF THE CHEST 08/05/2024 07:04:00 PM COMPARISON: Comparison with previous report dated 08/02/2024. CLINICAL HISTORY: Interstitial edema. FINDINGS: LINES, TUBES AND DEVICES: Loop recorder. LUNGS AND PLEURA: Mild vascular congestion. Bilateral central and basilar interstitial infiltrates, likely edema. Small pleural effusions. No pneumothorax. No significant change since prior study. HEART AND MEDIASTINUM: Cardiac enlargement. Calcification of the aorta. BONES AND SOFT TISSUES: Degenerative changes in the spine and shoulders. No acute osseous abnormality. IMPRESSION: 1. Cardiomegaly with mild vascular congestion and bilateral central and basilar interstitial infiltrates, likely edema. Small pleural effusions. No significant change since the prior study. Electronically signed by: Elsie Gravely MD 08/05/2024 07:09 PM EST RP Workstation: [id number]   DG Chest Port 1 View Result Date: 08/02/2024 EXAM: 1 VIEW(S) XRAY OF THE CHEST 08/02/2024 05:41:00 PM COMPARISON: 07/30/2024 CLINICAL HISTORY: Pneumonia FINDINGS: LUNGS  AND PLEURA: Small left pleural effusion, stable. Small right pleural effusion. Stable interstitial opacities bilateral. Persistent left retrocardiac consolidation is noted. HEART AND MEDIASTINUM: Cardiomegaly, unchanged. Atherosclerotic calcifications. BONES AND SOFT TISSUES: Old right rib fractures. IMPRESSION: 1. Persistent left retrocardiac consolidation. 2. Small left pleural effusion, stable, and small right pleural effusion. 3. Stable interstitial opacities bilaterally. Electronically signed by: Oneil Devonshire MD 08/02/2024 07:23 PM EST RP Workstation: [id number]   US  Venous Img Lower Bilateral (DVT) Result Date: 07/31/2024 EXAM: ULTRASOUND DUPLEX OF THE BILATERAL LOWER EXTREMITY VEINS TECHNIQUE: Duplex ultrasound using B-mode/gray scaled imaging and Doppler spectral analysis and color flow was obtained of the deep venous structures of the bilateral lower extremity. COMPARISON: 05/15/2021 and previous. CLINICAL HISTORY: 801713 Stroke (cerebrum) (HCC) 801713 Stroke (cerebrum) (HCC). FINDINGS: LEFT: The common femoral vein, femoral vein, popliteal vein, and posterior tibial vein of the left lower extremity demonstrate normal compressibility with normal color flow and spectral analysis. RIGHT: The common femoral vein, femoral vein, popliteal vein, and posterior tibial vein of the right lower extremity demonstrate normal compressibility with normal color flow and spectral analysis. IMPRESSION: 1. No evidence of DVT. Electronically signed by: Dayne Hassell MD 07/31/2024 03:58 PM EST RP Workstation: [id number]   US  CHEST (PLEURAL EFFUSION) Result Date: 07/30/2024 INDICATION: 89 year old female presents with left pleural effusion. Received request for diagnostic and therapeutic thoracentesis. EXAM: CHEST ULTRASOUND COMPARISON:  None Available. FINDINGS: Trace left pleural effusion. No significant pocket of fluid or percutaneous window to allow safe thoracentesis. Risks outweigh the benefits. IMPRESSION: Trace left  pleural effusion with no safe window for percutaneous access. No procedure performed. Read by: Kristi Davenport, NP Electronically Signed   By: Wilkie Lent M.D.   On: 07/30/2024 16:32   MR BRAIN WO CONTRAST Result Date: 07/30/2024 EXAM: MRI BRAIN WITHOUT CONTRAST 07/30/2024 02:37:26 PM TECHNIQUE: Multiplanar multisequence MRI of the head/brain was performed without the administration of intravenous contrast. COMPARISON: None available. CLINICAL HISTORY: concern for stroke FINDINGS: BRAIN AND VENTRICLES: Multiple punctate foci of acute infarction are present within the subcortical white matter of the posterior right temporal lobe, right parietal lobe adjacent to the atrium of the right lateral ventricle, and the high posterior right parietal cortex. A 6 mm focus of acute infarction is present in the left occipital lobe on image 68 of series 5. T2 and FLAIR signal is present in the left occipital infarct and  high right posterior parietal infarct. Remote lacunar infarcts are present within the right basal ganglia. Periventricular and subcortical white matter changes are present bilaterally. No intracranial hemorrhage. No mass. No midline shift. No hydrocephalus. The sella is unremarkable. Normal flow voids. The remainder of the exam is somewhat degraded by patient motion. ORBITS: Bilateral lens replacements are noted. The globes and orbits are otherwise within normal limits. SINUSES AND MASTOIDS: No acute abnormality. BONES AND SOFT TISSUES: Normal marrow signal. No acute soft tissue abnormality. IMPRESSION: 1. Multiple punctate foci of acute infarction in the posterior right temporal lobe, right parietal lobe adjacent to the atrium of the right lateral ventricle, and high posterior right parietal cortex. T2 signal changes are present within the right parietal lesion suggesting at least a subacute time frame of multiple infarcts involving the posterior territory 2. Acute/subacute 6 mm infarct in the left  occipital lobe. 3. Remote lacunar infarcts in the right basal ganglia. Electronically signed by: Lonni Necessary MD 07/30/2024 04:00 PM EST RP Workstation: [id number]   DG Chest Port 1 View Result Date: 07/30/2024 EXAM: 1 VIEW(S) XRAY OF THE CHEST 07/30/2024 12:53:00 AM COMPARISON: 07/26/2024. CLINICAL HISTORY: Pneumonia. FINDINGS: LUNGS AND PLEURA: Diffuse peripheral predominant interstitial reticulation is favored to represent sequelae of chronic lung disease possibly early pulmonary fibrosis. Superimposed mild edema not excluded. Progressive airspace consolidation within the left mid and left lower lung concerning for worsening pneumonia. Moderate left pleural effusion. No pneumothorax. HEART AND MEDIASTINUM: Cardiomegaly. Calcified aorta. BONES AND SOFT TISSUES: Old right rib fractures. IMPRESSION: 1. Progressive left mid and lower lung consolidation concerning for worsening pneumonia. 2. Moderate left pleural effusion. Electronically signed by: Waddell Calk MD 07/30/2024 06:29 AM EST RP Workstation: [id number]   ECHOCARDIOGRAM COMPLETE Result Date: 07/27/2024    ECHOCARDIOGRAM REPORT   Patient Name:   MAYLEN WALTERMIRE Plemons Date of Exam: 07/27/2024 Medical Rec #:  [id number]         Height:       61.0 in Accession #:    [id number]        Weight:       130.1 lb Date of Birth:  Mar 27, 1927        BSA:          1.573 m Patient Age:    89 years          BP:           117/55 mmHg Patient Gender: F                 HR:           75 bpm. Exam Location:  [facility] Procedure: 2D Echo, Color Doppler and Cardiac Doppler (Both Spectral and Color            Flow Doppler were utilized during procedure). Indications:     I21.4 NSTEMI  History:         Patient has prior history of Echocardiogram examinations. CAD,                  ESRD; Risk Factors:Hypertension.  Sonographer:     L. Thornton-Maynard Referring Phys:  [id number] [facility] [employer] [facility] Diagnosing Phys: Shelda Lonni MD  Sonographer Comments: Global longitudinal strain  was attempted. IMPRESSIONS  1. Left ventricular ejection fraction, by estimation, is 65 to 70%. The left ventricle has normal function. The left ventricle has no regional wall motion abnormalities. There is mild concentric left ventricular hypertrophy. Left ventricular diastolic parameters are consistent with Grade I diastolic dysfunction (impaired  relaxation). The average left ventricular global longitudinal strain is -18.6 %. The global longitudinal strain is normal.  2. Right ventricular systolic function is normal. The right ventricular size is normal. There is moderately elevated pulmonary artery systolic pressure. The estimated right ventricular systolic pressure is 59.9 mmHg.  3. Right atrial size was mildly dilated.  4. The mitral valve is normal in structure. Trivial mitral valve regurgitation. No evidence of mitral stenosis.  5. Tricuspid valve regurgitation is moderate.  6. The aortic valve is tricuspid. There is moderate calcification of the aortic valve. Aortic valve regurgitation is mild. Aortic valve sclerosis/calcification is present, without any evidence of aortic stenosis.  7. There is Moderate (Grade III) plaque involving the aortic arch and descending aorta.  8. The inferior vena cava is normal in size with greater than 50% respiratory variability, suggesting right atrial pressure of 3 mmHg. Comparison(s): Changes from prior study are noted. TR now moderate, elevated RVSP. FINDINGS  Left Ventricle: Left ventricular ejection fraction, by estimation, is 65 to 70%. The left ventricle has normal function. The left ventricle has no regional wall motion abnormalities. The average left ventricular global longitudinal strain is -18.6 %. Strain was performed and the global longitudinal strain is normal. The left ventricular internal cavity size was normal in size. There is mild concentric left ventricular hypertrophy. Left ventricular diastolic parameters are consistent with Grade I diastolic dysfunction  (impaired relaxation). Right Ventricle: The right ventricular size is normal. No increase in right ventricular wall thickness. Right ventricular systolic function is normal. There is moderately elevated pulmonary artery systolic pressure. The tricuspid regurgitant velocity is 3.77 m/s, and with an assumed right atrial pressure of 3 mmHg, the estimated right ventricular systolic pressure is 59.9 mmHg. Left Atrium: Left atrial size was normal in size. Right Atrium: Right atrial size was mildly dilated. Pericardium: There is no evidence of pericardial effusion. Mitral Valve: The mitral valve is normal in structure. Trivial mitral valve regurgitation. No evidence of mitral valve stenosis. Tricuspid Valve: The tricuspid valve is normal in structure. Tricuspid valve regurgitation is moderate . No evidence of tricuspid stenosis. Aortic Valve: Focal moderate calcification of noncoronary cusp. The aortic valve is tricuspid. There is moderate calcification of the aortic valve. Aortic valve regurgitation is mild. Aortic regurgitation PHT measures 474 msec. Aortic valve sclerosis/calcification is present, without any evidence of aortic stenosis. Aortic valve mean gradient measures 9.0 mmHg. Aortic valve peak gradient measures 16.6 mmHg. Aortic valve area, by VTI measures 1.89 cm. Pulmonic Valve: The pulmonic valve was not well visualized. Pulmonic valve regurgitation is trivial. No evidence of pulmonic stenosis. Aorta: The aortic root and ascending aorta are structurally normal, with no evidence of dilitation. There is moderate (Grade III) plaque involving the aortic arch and descending aorta. Venous: The inferior vena cava is normal in size with greater than 50% respiratory variability, suggesting right atrial pressure of 3 mmHg. IAS/Shunts: The atrial septum is grossly normal.  LEFT VENTRICLE PLAX 2D LVIDd:         4.20 cm     Diastology LVIDs:         2.70 cm     LV e' medial:    5.33 cm/s LV PW:         1.40 cm     LV E/e'  medial:  18.0 LV IVS:        1.40 cm     LV e' lateral:   6.20 cm/s LVOT diam:     2.00 cm  LV E/e' lateral: 15.5 LV SV:         76 LV SV Index:   49          2D Longitudinal Strain LVOT Area:     3.14 cm    2D Strain GLS (A4C):   -17.8 % LV IVRT:       77 msec     2D Strain GLS (A3C):   -21.6 %                            2D Strain GLS (A2C):   -16.5 %                            2D Strain GLS Avg:     -18.6 % LV Volumes (MOD) LV vol d, MOD A2C: 68.6 ml LV vol d, MOD A4C: 35.6 ml LV vol s, MOD A2C: 12.7 ml LV vol s, MOD A4C: 9.8 ml LV SV MOD A2C:     55.9 ml LV SV MOD A4C:     35.6 ml LV SV MOD BP:      38.9 ml RIGHT VENTRICLE             IVC RV Basal diam:  3.93 cm     IVC diam: 1.40 cm RV Mid diam:    3.00 cm RV S prime:     12.90 cm/s  PULMONARY VEINS TAPSE (M-mode): 2.6 cm      Diastolic Velocity: 51.60 cm/s                             S/D Velocity:       1.40                             Systolic Velocity:  73.90 cm/s LEFT ATRIUM             Index        RIGHT ATRIUM           Index LA diam:        2.80 cm 1.78 cm/m   RA Area:     18.00 cm LA Vol (A2C):   57.1 ml 36.29 ml/m  RA Volume:   49.60 ml  31.53 ml/m LA Vol (A4C):   32.4 ml 20.59 ml/m LA Biplane Vol: 43.4 ml 27.59 ml/m  AORTIC VALVE                     PULMONIC VALVE AV Area (Vmax):    1.63 cm      PV Vmax:          1.00 m/s AV Area (Vmean):   1.78 cm      PV Peak grad:     4.0 mmHg AV Area (VTI):     1.89 cm      PR End Diast Vel: 4.30 msec AV Vmax:           204.00 cm/s AV Vmean:          140.500 cm/s AV VTI:            0.403 m AV Peak Grad:      16.6 mmHg AV Mean Grad:      9.0 mmHg LVOT Vmax:         106.00 cm/s LVOT  Vmean:        79.700 cm/s LVOT VTI:          0.243 m LVOT/AV VTI ratio: 0.60 AI PHT:            474 msec  AORTA Ao Root diam: 3.00 cm Ao Asc diam:  3.30 cm MITRAL VALVE                TRICUSPID VALVE MV Area (PHT): 3.31 cm     TR Peak grad:   56.9 mmHg MV Decel Time: 229 msec     TR Vmax:        377.00 cm/s MV E velocity:  95.90 cm/s MV A velocity: 106.00 cm/s  SHUNTS MV E/A ratio:  0.90         Systemic VTI:  0.24 m                             Systemic Diam: 2.00 cm Shelda Bruckner MD Electronically signed by Shelda Bruckner MD Signature Date/Time: 07/27/2024/2:52:08 PM    Final    CT CHEST ABDOMEN PELVIS WO CONTRAST Result Date: 07/26/2024 EXAM: CT CHEST, ABDOMEN AND PELVIS WITHOUT CONTRAST 03/05/2023 09:23:51 PM TECHNIQUE: CT of the chest, abdomen and pelvis was performed without the administration of intravenous contrast. Multiplanar reformatted images are provided for review. Automated exposure control, iterative reconstruction, and/or weight based adjustment of the mA/kV was utilized to reduce the radiation dose to as low as reasonably achievable. COMPARISON: None available. CLINICAL HISTORY: Sepsis. FINDINGS: CHEST: MEDIASTINUM AND LYMPH NODES: Mild cardiomegaly. Diffuse coronary artery and aortic atherosclerosis. The central airways are clear. No mediastinal, hilar or axillary lymphadenopathy. LUNGS AND PLEURA: Consolidation in the left lower lobe and lingula and to a lesser extent right lower lobe compatible with pneumonia. No pleural effusion or pneumothorax. ABDOMEN AND PELVIS: LIVER: The liver is unremarkable. GALLBLADDER AND BILE DUCTS: Small layering gallstones within the gallbladder. No evidence of acute cholecystitis. No biliary ductal dilatation. SPLEEN: No acute abnormality. PANCREAS: No acute abnormality. ADRENAL GLANDS: No acute abnormality. KIDNEYS, URETERS AND BLADDER: Atrophic kidneys bilaterally. Left renal mid pole cyst measures 3.5 cm. Per consensus, no follow-up is needed for simple Bosniak type 1 and 2 renal cysts, unless the patient has a malignancy history or risk factors. The urinary bladder is decompressed. No stones in the kidneys or ureters. No hydronephrosis. No perinephric or periureteral stranding. GI AND BOWEL: Stomach demonstrates no acute abnormality. Postoperative changes in the  rectosigmoid colon. Scattered colonic diverticulosis, most pronounced in the left colon. There is no bowel obstruction. REPRODUCTIVE ORGANS: Prior hysterectomy. PERITONEUM AND RETROPERITONEUM: No ascites. No free air. VASCULATURE: Aorta is normal in caliber. ABDOMINAL AND PELVIS LYMPH NODES: No lymphadenopathy. BONES AND SOFT TISSUES: Diffuse degenerative disc and facet disease throughout the thoracolumbar spine. No focal soft tissue abnormality. IMPRESSION: 1. Multifocal consolidation greatest in the left lower lobe and lingula, compatible with pneumonia. 2. Cholelithiasis without acute cholecystitis. 3. Bilateral renal atrophy. 4. Colonic diverticulosis. 5. Aortic atherosclerosis. Coronary artery disease. Electronically signed by: Franky Crease MD 07/26/2024 09:37 PM EDT RP Workstation: [id number]   CT HEAD WO CONTRAST ( ) Result Date: 07/26/2024 EXAM: CT HEAD WITHOUT CONTRAST 07/26/2024 09:23:51 PM TECHNIQUE: CT of the head was performed without the administration of intravenous contrast. Automated exposure control, iterative reconstruction, and/or weight based adjustment of the mA/kV was utilized to reduce the radiation dose to as low as reasonably achievable. COMPARISON: 05/02/2022 CLINICAL HISTORY: Headache, new onset (  Age >= 53y) FINDINGS: BRAIN AND VENTRICLES: No acute hemorrhage. No evidence of acute infarct. No hydrocephalus. No extra-axial collection. No mass effect or midline shift. There is diffuse cerebral atrophy and chronic small vessel disease throughout the deep white matter. ORBITS: No acute abnormality. SINUSES: No acute abnormality. SOFT TISSUES AND SKULL: No acute soft tissue abnormality. No skull fracture. IMPRESSION: 1. No acute intracranial abnormality. 2. Diffuse cerebral atrophy and chronic small vessel disease throughout the deep white matter. Electronically signed by: Franky Crease MD 07/26/2024 09:33 PM EDT RP Workstation: [id number]   DG Chest Portable 1 View Result Date:  07/26/2024 EXAM: 1 VIEW(S) XRAY OF THE CHEST 07/26/2024 08:43:00 PM COMPARISON: Portable chest 04/08/2024. CLINICAL HISTORY: Chest pain and lethargy with hypoxemia. Symptom onset following dialysis this morning. FINDINGS: LINES, TUBES AND DEVICES: An electronic device is superimposed over the chest centrally, and there are overlying telemetry leads. LUNGS AND PLEURA: There is dense airspace disease in the left mid to lower lung fields predominating in the periphery, most likely a consolidated pneumonia likely in the lingula as there is obscuration of the lower left heart border. There is a small pleural effusion, most likely a parapneumonic effusion. Rest of the lungs are clear of infiltrates with chronic interstitial changes. No pneumothorax. HEART AND MEDIASTINUM: The aorta is tortuous and calcified, stable mediastinum. There is mild cardiomegaly but no evidence for CHF. BONES AND SOFT TISSUES: Osteopenia. There are new mildly displaced posterolateral fractures of the right 6th and 7th ribs, age indeterminate but probably subacute with suggestion of partial healing. DISCS/DEGENERATIVE CHANGES: Moderate thoracic spondylosis. IMPRESSION: 1. Dense left mid to lower lung airspace disease most consistent with lingular pneumonia, with a small likely parapneumonic effusion. Follow-up study recommended to ensure clearing. 2. New mildly displaced posterolateral fractures of the right 6th and 7th ribs, age indeterminate but possibly subacute. Electronically signed by: Francis Quam MD 07/26/2024 08:53 PM EDT RP Workstation: [id number]    Microbiology: Results for orders placed or performed during the hospital encounter of 07/26/24  Blood culture (routine x 2)     Status: None   Collection Time: 07/26/24  8:11 PM   Specimen: BLOOD  Result Value Ref Range Status   Specimen Description BLOOD RIGHT ANTECUBITAL  Final   Special Requests   Final    BOTTLES DRAWN AEROBIC AND ANAEROBIC Blood Culture adequate volume    Culture   Final    NO GROWTH 5 DAYS Performed at [facility], [address] [place], [place] [address]    Report Status 07/31/2024 FINAL  Final  Resp panel by RT-PCR (RSV, Flu A&B, Covid) Anterior Nasal Swab     Status: None   Collection Time: 07/26/24  8:15 PM   Specimen: Anterior Nasal Swab  Result Value Ref Range Status   SARS Coronavirus 2 by RT PCR NEGATIVE NEGATIVE Final    Comment: (NOTE) SARS-CoV-2 target nucleic acids are NOT DETECTED.  The SARS-CoV-2 RNA is generally detectable in upper respiratory specimens during the acute phase of infection. The lowest concentration of SARS-CoV-2 viral copies this assay can detect is 138 copies/mL. A negative result does not preclude SARS-Cov-2 infection and should not be used as the sole basis for treatment or other patient management decisions. A negative result may occur with  improper specimen collection/handling, submission of specimen other than nasopharyngeal swab, presence of viral mutation(s) within the areas targeted by this assay, and inadequate number of viral copies(<138 copies/mL). A negative result must be combined with clinical observations, patient history,  and epidemiological information. The expected result is Negative.  Fact Sheet for Patients:  bloggercourse.com  Fact Sheet for Healthcare Providers:  seriousbroker.it  This test is no t yet approved or cleared by the [place]  FDA and  has been authorized for detection and/or diagnosis of SARS-CoV-2 by FDA under an Emergency Use Authorization (EUA). This EUA will remain  in effect (meaning this test can be used) for the duration of the COVID-19 declaration under Section 564(b)(1) of the Act, 21 U.S.C.section 360bbb-3(b)(1), unless the authorization is terminated  or revoked sooner.       Influenza A by PCR NEGATIVE NEGATIVE Final   Influenza B by PCR NEGATIVE NEGATIVE Final    Comment:  (NOTE) The Xpert Xpress SARS-CoV-2/FLU/RSV plus assay is intended as an aid in the diagnosis of influenza from Nasopharyngeal swab specimens and should not be used as a sole basis for treatment. Nasal washings and aspirates are unacceptable for Xpert Xpress SARS-CoV-2/FLU/RSV testing.  Fact Sheet for Patients: bloggercourse.com  Fact Sheet for Healthcare Providers: seriousbroker.it  This test is not yet approved or cleared by the [place]  FDA and has been authorized for detection and/or diagnosis of SARS-CoV-2 by FDA under an Emergency Use Authorization (EUA). This EUA will remain in effect (meaning this test can be used) for the duration of the COVID-19 declaration under Section 564(b)(1) of the Act, 21 U.S.C. section 360bbb-3(b)(1), unless the authorization is terminated or revoked.     Resp Syncytial Virus by PCR NEGATIVE NEGATIVE Final    Comment: (NOTE) Fact Sheet for Patients: bloggercourse.com  Fact Sheet for Healthcare Providers: seriousbroker.it  This test is not yet approved or cleared by the [place]  FDA and has been authorized for detection and/or diagnosis of SARS-CoV-2 by FDA under an Emergency Use Authorization (EUA). This EUA will remain in effect (meaning this test can be used) for the duration of the COVID-19 declaration under Section 564(b)(1) of the Act, 21 U.S.C. section 360bbb-3(b)(1), unless the authorization is terminated or revoked.  Performed at [facility], [address] [place], [place] [address]   Blood culture (routine x 2)     Status: None   Collection Time: 07/26/24  8:16 PM   Specimen: BLOOD  Result Value Ref Range Status   Specimen Description BLOOD RIGHT ANTECUBITAL  Final   Special Requests   Final    BOTTLES DRAWN AEROBIC AND ANAEROBIC Blood Culture results may not be optimal due to an inadequate volume of blood  received in culture bottles   Culture   Final    NO GROWTH 5 DAYS Performed at [facility], [address] [place], [place] [address]    Report Status 07/31/2024 FINAL  Final  MRSA Next Gen by PCR, Nasal     Status: None   Collection Time: 07/27/24  5:00 PM   Specimen: Nasal Mucosa; Nasal Swab  Result Value Ref Range Status   MRSA by PCR Next Gen NOT DETECTED NOT DETECTED Final    Comment: (NOTE) The GeneXpert MRSA Assay (FDA approved for NASAL specimens only), is one component of a comprehensive MRSA colonization surveillance program. It is not intended to diagnose MRSA infection nor to guide or monitor treatment for MRSA infections. Test performance is not FDA approved in patients less than 92 years old. Performed at [facility], [address], [place], [place] [address]   Gastrointestinal Panel by PCR , Stool     Status: None   Collection Time: 08/01/24  4:32 PM  Specimen: Stool  Result Value Ref Range Status   Campylobacter species NOT DETECTED NOT DETECTED Final   Plesimonas shigelloides NOT DETECTED NOT DETECTED Final   Salmonella species NOT DETECTED NOT DETECTED Final   Yersinia enterocolitica NOT DETECTED NOT DETECTED Final   Vibrio species NOT DETECTED NOT DETECTED Final   Vibrio cholerae NOT DETECTED NOT DETECTED Final   Enteroaggregative E coli (EAEC) NOT DETECTED NOT DETECTED Final   Enteropathogenic E coli (EPEC) NOT DETECTED NOT DETECTED Final   Enterotoxigenic E coli (ETEC) NOT DETECTED NOT DETECTED Final   Shiga like toxin producing E coli (STEC) NOT DETECTED NOT DETECTED Final   Shigella/Enteroinvasive E coli (EIEC) NOT DETECTED NOT DETECTED Final   Cryptosporidium NOT DETECTED NOT DETECTED Final   Cyclospora cayetanensis NOT DETECTED NOT DETECTED Final   Entamoeba histolytica NOT DETECTED NOT DETECTED Final   Giardia lamblia NOT DETECTED NOT DETECTED Final   Adenovirus F40/41 NOT DETECTED NOT DETECTED Final   Astrovirus NOT DETECTED  NOT DETECTED Final   Norovirus GI/GII NOT DETECTED NOT DETECTED Final   Rotavirus A NOT DETECTED NOT DETECTED Final   Sapovirus (I, II, IV, and V) NOT DETECTED NOT DETECTED Final    Comment: Performed at [facility], [address] [place], [place] [address]    Labs: CBC: Recent Labs  Lab 08/19/24 1757 08/19/24 2318 08/20/24 0119 08/20/24 0839  WBC 21.1* 16.4* 16.4* 15.9*  NEUTROABS 19.1*  --   --   --   HGB 6.5* 7.1* 6.8* 8.5*  HCT 21.0* 22.1* 21.3* 26.8*  MCV 102.4* 98.2 97.7 94.4  PLT 676* 467* 567* 549*   Basic Metabolic Panel: Recent Labs  Lab 08/19/24 1757 08/19/24 2318 08/20/24 0119  NA 134*  --  133*  K 3.8  --  3.7  CL 96*  --  96*  CO2 32  --  33*  GLUCOSE 137*  --  83  BUN 30*  --  35*  CREATININE 2.28*  --  2.62*  CALCIUM  8.2*  --  7.6*  MG  --  1.8  --    Liver Function Tests: Recent Labs  Lab 08/19/24 1757  AST 27  ALT 12  ALKPHOS 53  BILITOT 0.2  PROT 5.5*  ALBUMIN  2.7*   CBG: Recent Labs  Lab 08/20/24 0755  GLUCAP 75    Discharge time spent: greater than 30 minutes.  This record has been created using Conservation officer, historic buildings. Errors have been sought and corrected,but may not always be located. Such creation errors do not reflect on the standard of care.   Signed: Amaryllis Dare, MD [facility] 08/20/2024

## 2024-08-20 NOTE — Care Management Obs Status (Signed)
 MEDICARE OBSERVATION STATUS NOTIFICATION   Patient Details  Name: Cassandra [name] MRN: [id number] Date of Birth: 01/12/1927   Medicare Observation Status Notification Given:  Yes    Rojelio [name] 08/20/2024, 12:20 PM

## 2024-08-20 NOTE — Consult Note (Signed)
 Cassandra Copping, MD [facility]  [address], [address] [place], [place] [address] Phone: [phone] Fax : [phone]  Consultation  Referring Provider:     Dr. Hilma Primary Care Physician:  Cleotilde Oneil FALCON, MD Primary Gastroenterologist:     Dr. Maryruth      Reason for Consultation:     Anemia  Date of Admission:  08/19/2024 Date of Consultation:  08/20/2024         HPI:   Cassandra [name] is a 89 y.o. female with a history for end-stage renal disease, hypertension, hyperlipidemia, diabetes, coronary artery disease, hypothyroidism and history of GI bleed.  The patient also has a mutation positive for myeloproliferative disease and polycythemia vera.  The patient has thrombocytosis with chronic leukocytosis pleural effusion and came to the hospital with a decreased hemoglobin.  The patient was recently in the hospital with multifocal pneumonia and acute stroke and was put on Plavix  and aspirin . The patient's hemoglobin 2 weeks ago was 12.7 and the patient was admitted with a hemoglobin of 6.5.  The patient was transfused and the hemoglobin this morning was 8.5.  There is no report of any nausea or vomiting black stools or bloody stools.  There was melena on a rectal exam in the ED.  I was asked to see the patient because the patient's son had some questions about options for the patient.  Past Medical History:  Diagnosis Date   Anxiety    Aortic atherosclerosis    Arthritis    osteoarthritis in hands, knees ( R torn cartilage, L meniscus removed)    Atherosclerosis of native coronary artery of native heart with unstable angina pectoris (HCC)    CAD (coronary artery disease) 1999   a.) s/p PCI (details unkown) in [place]  in 1999   Campylobacter antigen positive 11/29/2014   a.) presented to [facility] ED with N/V/D; stool culture (+)   ESRD on hemodialysis (HCC)    a.) T-Th-Sat   Glaucoma    Hypercholesterolemia    Hyperlipidemia    Hypertension    Hypothyroidism    Lumbar stenosis  with neurogenic claudication    Perforation of colon as colonoscopy complication (HCC) 2010   a.) required short term colostomy with subsequent take down/reversal   RLS (restless legs syndrome)    Thrombocytosis    Type 2 diabetes mellitus with chronic kidney disease [facility])     Past Surgical History:  Procedure Laterality Date   A/V FISTULAGRAM Left 12/25/2023   Procedure: A/V Fistulagram;  Surgeon: Jama Cordella MATSU, MD;  Location: [facility] INVASIVE CV LAB;  Service: Cardiovascular;  Laterality: Left;   ABDOMINAL HYSTERECTOMY     left ovary removed   AV FISTULA PLACEMENT Left 05/25/2023   Procedure: ARTERIOVENOUS (AV) FISTULA CREATION (BRACHIALCEPHALIC);  Surgeon: Jama Cordella MATSU, MD;  Location: [facility] ORS;  Service: Vascular;  Laterality: Left;   BLEPHAROPLASTY Bilateral    CATARACT EXTRACTION W/ INTRAOCULAR LENS  IMPLANT, BILATERAL     COLONOSCOPY  2010   COLOSTOMY  03/2009   perforated colon from colonoscopy   COLOSTOMY REVERSAL  08/2009   CORONARY ANGIOPLASTY WITH STENT PLACEMENT  1999   DIALYSIS/PERMA CATHETER INSERTION N/A 03/08/2023   Procedure: DIALYSIS/PERMA CATHETER INSERTION;  Surgeon: Marea Selinda RAMAN, MD;  Location: [facility] INVASIVE CV LAB;  Service: Cardiovascular;  Laterality: N/A;   DIALYSIS/PERMA CATHETER REMOVAL N/A 09/05/2023   Procedure: DIALYSIS/PERMA CATHETER REMOVAL;  Surgeon: Marea Selinda RAMAN, MD;  Location: [facility] INVASIVE CV LAB;  Service:  Cardiovascular;  Laterality: N/A;   FOOT SURGERY Bilateral    toes correction   KNEE ARTHROSCOPY Right    torn cartilage   KNEE ARTHROSCOPY W/ MENISCECTOMY Left 01/2012   TEMPORARY DIALYSIS CATHETER N/A 03/05/2023   Procedure: TEMPORARY DIALYSIS CATHETER;  Surgeon: Marea Selinda RAMAN, MD;  Location: [facility] INVASIVE CV LAB;  Service: Cardiovascular;  Laterality: N/A;    Prior to Admission medications   Medication Sig Start Date End Date Taking? Authorizing Provider  albuterol  (VENTOLIN  HFA) 108 (90 Base) MCG/ACT inhaler Inhale 2 puffs into  the lungs every 6 (six) hours as needed for shortness of breath. 03/22/23  Yes [provider]  ALPRAZolam  (XANAX ) 0.25 MG tablet Take 1 tablet (0.25 mg total) by mouth daily as needed. 08/04/24  Yes Awanda City, MD  amLODipine  (NORVASC ) 10 MG tablet Take 1 tablet by mouth daily. 03/23/23  Yes [provider]  amLODipine  (NORVASC ) 5 MG tablet Take 1 tablet (5 mg total) by mouth daily. 08/06/24  Yes Franchot Novel, MD  aspirin  81 MG chewable tablet Chew 1 tablet by mouth daily. 03/23/23  Yes [provider]  cetirizine (ZYRTEC) 10 MG tablet Take 1 tablet by mouth daily as needed for allergies.   Yes [provider]  cloNIDine  (CATAPRES ) 0.1 MG tablet Take 0.1 mg by mouth 2 (two) times daily. 06/25/24  Yes [provider]  clopidogrel  (PLAVIX ) 75 MG tablet Take 1 tablet (75 mg total) by mouth daily. 08/04/24 11/02/24 Yes Awanda City, MD  CRYODOSE TA AERO Apply 1 Application topically as directed. SPRAY 1 AS DIRECTED FOR USE DURING DIALYSIS TO ACCESS 07/11/24  Yes [provider]  [employer] D3 25 MCG (1000 UT) capsule Take 1,000 Units by mouth daily. 02/21/23  Yes [provider]  DROXIA  300 MG capsule TAKE 1 CAPSULE (300 MG TOTAL) BY MOUTH DAILY. MAY TAKE WITH FOOD TO MINIMIZE GI SIDE EFFECTS. 04/23/20  Yes Dasie Tinnie MATSU, NP  gabapentin  (NEURONTIN ) 100 MG capsule Take 1-2 capsules (100-200 mg total) by mouth 3 (three) times daily. 100 mg am 100 mg afternoon 200 mg bedtime. Home med. 04/10/24  Yes Awanda City, MD  HYDROcodone -acetaminophen  (NORCO/VICODIN) 5-325 MG tablet Take 1 tablet by mouth 3 (three) times daily as needed for moderate pain (pain score 4-6) or severe pain (pain score 7-10). 08/04/24  Yes Awanda City, MD  hydrOXYzine  (ATARAX ) 10 MG tablet Take 10 mg by mouth daily as needed. 07/03/24  Yes [provider]  latanoprost  (XALATAN ) 0.005 % ophthalmic solution Place 1 drop into both eyes at bedtime. 09/13/15  Yes [provider]   levothyroxine  (SYNTHROID ) 88 MCG tablet Take 88 mcg by mouth daily. Take on an empty stomach 30 to 60 minutes before breakfast 12/08/14  Yes [provider]  Nystatin (GERHARDT'S BUTT CREAM) CREA Apply 1 Application topically 3 (three) times daily. 08/04/24  Yes Awanda City, MD  polyethylene glycol (HEALTHYLAX) 17 g packet Take 1 packet by mouth daily. 03/23/23  Yes [provider]  rosuvastatin  (CRESTOR ) 10 MG tablet Take 1 tablet (10 mg total) by mouth daily. Reduced from 20 mg to 10 mg, due to ESRD. 07/18/24  Yes Awanda City, MD  sertraline  (ZOLOFT ) 50 MG tablet Take 50 mg by mouth daily.   Yes [provider]  timolol  (TIMOPTIC -XR) 0.5 % ophthalmic gel-forming Place 1 drop into both eyes every morning. 05/06/21  Yes [provider]  acidophilus (RISAQUAD) CAPS capsule Take 2 capsules by mouth 2 (two) times daily. Patient not taking:  Reported on 08/20/2024 08/04/24 09/03/24  Awanda City, MD  azelastine  (ASTELIN ) 0.1 % nasal spray Place 2 sprays into the nose 2 (two) times daily. Patient not taking: Reported on 08/20/2024 03/23/23   [provider]  benzocaine  (ORAJEL) 10 % mucosal gel Use as directed in the mouth or throat 3 (three) times daily as needed for mouth pain. Patient not taking: Reported on 08/20/2024 08/04/24   Awanda City, MD  betamethasone valerate (VALISONE) 0.1 % cream Apply 1 Application topically daily. Patient not taking: Reported on 08/20/2024    [provider]  calcium  acetate (PHOSLO ) 667 MG capsule Take 1,334 mg by mouth 3 (three) times daily. Patient not taking: Reported on 08/20/2024 06/30/23   [provider]  furosemide  (LASIX ) 80 MG tablet Take 80 mg by mouth daily. Patient not taking: Reported on 08/20/2024 03/23/23   [provider]  glucose blood test strip Use once daily. Use as instructed. 02/21/17   [provider]  lidocaine -prilocaine  (EMLA ) cream Apply 1 Application topically as  needed. Patient not taking: Reported on 08/20/2024 03/24/24   Camara, Amadou, MD  metoprolol  tartrate (LOPRESSOR ) 25 MG tablet Take 25 mg by mouth 2 (two) times daily. Patient not taking: Reported on 08/20/2024 03/23/23   [provider]  naloxone [facility]) nasal spray 4 mg/0.1 mL Place 4 mg into the nose once. Patient not taking: Reported on 08/20/2024 06/23/24   [provider]  Nutritional Supplements (FEEDING SUPPLEMENT, NEPRO CARB STEADY,) LIQD Take 237 mLs by mouth 2 (two) times daily between meals. 03/12/23   Alexander, Natalie, DO  pantoprazole  (PROTONIX ) 40 MG tablet Take 1 tablet by mouth daily. Patient not taking: Reported on 08/20/2024 03/23/23   [provider]  rOPINIRole  (REQUIP ) 0.5 MG tablet Take 1 tablet (0.5 mg total) by mouth in the morning and at bedtime. 08/22/23 08/16/24  Camara, Amadou, MD  traZODone  (DESYREL ) 50 MG tablet Take 1 tablet (50 mg total) by mouth at bedtime as needed for sleep. Home med. Patient not taking: Reported on 08/20/2024 08/04/24   Awanda City, MD    Family History  Problem Relation Age of Onset   Hypertension Mother    Hypertension Father    Early death Father    Heart disease Father      Social History   Tobacco Use   Smoking status: Former    Current packs/day: 0.00    Types: Cigarettes    Quit date: 01/21/1980    Years since quitting: 44.6   Smokeless tobacco: Never  Vaping Use   Vaping status: Never Used  Substance Use Topics   Alcohol use: Yes    Alcohol/week: 0.0 standard drinks of alcohol    Comment: occasionally   Drug use: No    Allergies as of 08/19/2024 - Review Complete 08/19/2024  Allergen Reaction Noted   Atorvastatin Other (See Comments) 01/21/2015   Cyclobenzaprine Other (See Comments) 01/21/2015   Mirtazapine  08/22/2016   Oxycodone-acetaminophen  Other (See Comments) 01/21/2015   Paroxetine hcl Other (See Comments) 01/21/2015   Penicillins Swelling 01/21/2015   Propoxyphene Other (See  Comments) 01/21/2015   Trazodone  Other (See Comments) 01/21/2015    Review of Systems:    All systems reviewed and negative except where noted in HPI.   Physical Exam:  Vital signs in last 24 hours: Temp:  [97.6 F (36.4 C)-98.6 F (37 C)] 98 F (36.7 C) (11/26 1041) Pulse Rate:  [52-78] 52 (11/26 1041) Resp:  [12-20] 20 (11/26 1041) BP: (77-141)/(31-52) 137/52 (11/26  1041) SpO2:  [89 %-100 %] 97 % (11/26 1041) Weight:  [53.7 kg] 53.7 kg (11/25 1730) Last BM Date : 08/19/24 General:   Pleasant, cooperative in NAD Head:  Normocephalic and atraumatic. Eyes:   No icterus.   Conjunctiva pink. PERRLA. Ears:  Normal auditory acuity. Neck:  Supple; no masses or thyroidomegaly Lungs: Respirations even and unlabored. Lungs clear to auscultation bilaterally.   No wheezes, crackles, or rhonchi.  Heart:  Regular rate and rhythm;  Without murmur, clicks, rubs or gallops Abdomen:  Soft, nondistended, nontender. Normal bowel sounds. No appreciable masses or hepatomegaly.  No rebound or guarding.  Rectal:  Not performed. Msk:  Symmetrical without gross deformities.   Extremities:  Without edema, cyanosis or clubbing. Neurologic:  Alert and oriented x3;  grossly normal neurologically. Skin:  Intact without significant lesions or rashes. Cervical Nodes:  No significant cervical adenopathy. Psych:  Alert and cooperative. Normal affect.  LAB RESULTS: Recent Labs    08/19/24 2318 08/20/24 0119 08/20/24 0839  WBC 16.4* 16.4* 15.9*  HGB 7.1* 6.8* 8.5*  HCT 22.1* 21.3* 26.8*  PLT 467* 567* 549*   BMET Recent Labs    08/19/24 1757 08/20/24 0119  NA 134* 133*  K 3.8 3.7  CL 96* 96*  CO2 32 33*  GLUCOSE 137* 83  BUN 30* 35*  CREATININE 2.28* 2.62*  CALCIUM  8.2* 7.6*   LFT Recent Labs    08/19/24 1757  PROT 5.5*  ALBUMIN  2.7*  AST 27  ALT 12  ALKPHOS 53  BILITOT 0.2   PT/INR Recent Labs    08/19/24 1757  LABPROT 13.6  INR 1.0    STUDIES: DG Chest 1 View Result  Date: 08/19/2024 EXAM: 1 VIEW(S) XRAY OF THE CHEST 08/19/2024 07:04:00 PM COMPARISON: 08/05/2024 CLINICAL HISTORY: elevated wbc FINDINGS: LUNGS AND PLEURA: Left basilar atelectasis or infiltrate. No pleural effusion. No pneumothorax. HEART AND MEDIASTINUM: Aortic atherosclerosis. No acute abnormality of the cardiac and mediastinal silhouettes. BONES AND SOFT TISSUES: No acute osseous abnormality. IMPRESSION: 1. Left basilar atelectasis or infiltrate. 2. Aortic atherosclerosis. Electronically signed by: Franky Crease MD 08/19/2024 07:16 PM EST RP Workstation: [id number]      Impression / Plan:   Assessment: Principal Problem:   Symptomatic anemia Active Problems:   Hypertension   Thrombocytosis   CAD (coronary artery disease), native coronary artery   Polycythemia vera (HCC)   Leukocytosis   GI bleed   ESRD (end stage renal disease) on dialysis (HCC)   Stroke (HCC)   Hypothyroidism   Dyslipidemia   Type II diabetes mellitus with renal manifestations (HCC)   Myocardial injury   Depression with anxiety   Sacral ulcer (HCC)   Prolonged QT interval   Cassandra [name] is a 89 y.o. y/o female with significant anemia on Plavix  and aspirin .  The patient and her son were spoken to today about the options of an EGD and colonoscopy looking for source of bleeding.  They were also told about the need for sedation for this procedure.  Plan:  The patient and her son have agreed to conservative treatment without any aggressive measures at this time.  I believe it is the plan to stop the anticoagulation on this patient due to the GI bleeding and see if the patient's hemoglobin remained stable.  After risks and benefits of the procedures were explained to the patient and son they have agreed that this is not something he would like to pursue.  I will sign off.  Please call if  any further GI concerns or questions.  We would like to thank you for the opportunity to participate in the care of Cassandra [name].   Thank you for involving me in the care of this patient.      LOS: 0 days   Cassandra Copping, MD, MD. NOLIA 08/20/2024, 12:12 PM,  Pager [phone] 7am-5pm  Check AMION for 5pm -7am coverage and on weekends   Note: This dictation was prepared with Dragon dictation along with smaller phrase technology. Any transcriptional errors that result from this process are unintentional.

## 2024-08-20 NOTE — ED Notes (Signed)
Pt repositioned onto back

## 2024-08-20 NOTE — ED Notes (Signed)
 This RN was rounding on patient and found pt's Mepilex in the bed. Pt stated I took it off. This RN explained the importance of keeping the Mepilex on. This RN emphasized to the pt that she already have pressure ulcers on her sacrum and they need to be cover to prevent the ulcers from getting worse. Pt states it hurts and it is comfortable. This RN reminded pt that she was given pain medication and barrier ointment was placed on her bottom and groin area which should help with the pain. I offered pt another Mepilex and she stated I don't want another one. Wound team has been consulted.

## 2024-08-20 NOTE — Consult Note (Addendum)
 WOC Nurse Consult Note: Reason for Consult: Consult requested for perineal and labial wounds Performed remotely after review of progress notes and photos in the EMR.  Pt is familiar to [facility] team from a recent admission on 11/8.  Bilat buttocks/inner perineum/labia folds with red moist patchy areas of moisture associated skin damage and partial thickness skin loss.   Dressing procedure/placement/frequency: Topical treatment orders provided for bedside nurses to perform as follows to protect skin and repel moisture: Cleanse perineum and labia with Vashe wound cleanser Soila [id number]) do not rinse and allow to air dry. Apply Gerhardt's Butt Cream 3 times a day and prn soiling.    Please re-consult if further assistance is needed.  Thank-you,  Stephane Fought MSN, RN, CWOCN, CWCN-AP, CNS Contact Mon-Fri 0700-1500: [phone]

## 2024-08-20 NOTE — Care Management Obs Status (Signed)
 MEDICARE OBSERVATION STATUS NOTIFICATION   Patient Details  Name: CELESTINA GIRONDA MRN: [id number] Date of Birth: 1926-10-15   Medicare Observation Status Notification Given:       Rojelio [name] 08/20/2024, 12:19 PM

## 2024-08-20 NOTE — Progress Notes (Signed)
 Report called to [place], CHARITY FUNDRAISER at [facility],

## 2024-08-20 NOTE — TOC Transition Note (Addendum)
 Transition of Care [facility]) - Discharge Note   Patient Details  Name: Cassandra [name] MRN: [id number] Date of Birth: 05/18/1927  Transition of Care [facility]) CM/SW Contact:  Alfonso Rummer, LCSW Phone Number: 08/20/2024, 1:52 PM   Clinical Narrative:      Pt will transition back to [facility] via [facility]. No further TOC needs identified.  Spouse contacted via phone regarding patients discharge.     Patient Goals and CMS Choice            Discharge Placement                       Discharge Plan and Services Additional resources added to the After Visit Summary for                                       Social Drivers of Health (SDOH) Interventions SDOH Screenings   Food Insecurity: No Food Insecurity (08/20/2024)  Housing: Low Risk  (08/20/2024)  Transportation Needs: No Transportation Needs (08/20/2024)  Utilities: Not At Risk (08/20/2024)  Financial Resource Strain: Low Risk  (06/25/2024)   Received [facility]  Social Connections: Moderately Isolated (08/20/2024)  Tobacco Use: Medium Risk (08/19/2024)     Readmission Risk Interventions    07/28/2024    2:52 PM 03/09/2023    4:09 PM  Readmission Risk Prevention Plan  Transportation Screening Complete Complete  PCP or Specialist Appt within 3-5 Days  Complete  HRI or Home Care Consult  Complete  Social Work Consult for Recovery Care Planning/Counseling  Complete  Palliative Care Screening  Not Applicable  Medication Review [employer]) Complete Complete  PCP or Specialist appointment within 3-5 days of discharge Complete   HRI or Home Care Consult Complete   SW Recovery Care/Counseling Consult Complete   Palliative Care Screening Not Applicable   Skilled Nursing Facility Not Applicable

## 2024-08-20 NOTE — Consult Note (Signed)
 Consultation Note Date: 08/20/2024 at 1015  Patient Name: Cassandra [name]  DOB: [birth year]  MRN: [id number]  Age / Sex: 89 y.o., female  PCP: Cleotilde Oneil FALCON, MD Referring Physician: Caleen Qualia, MD  HPI/Patient Profile: 89 y.o. female  with past medical history of ESRD-HD(TTS),  HTN, HLD, DM, CAD, hypothyroidism, GI bleeding, depression with anxiety, JAK2 mutation with positive myeloproliferative neoplasm, polycythemia vera, thrombocytosis, chronic leukocytosis, pleural effusion, RLS admitted on 08/19/2024 with low hemoglobin.   Clinical Assessment and Goals of Care: Extensive chart review completed prior to meeting patient including labs, vital signs, imaging, progress notes, orders, and available advanced directive documents from current and previous encounters. I then met with patient in ED. respirations are even and unlabored that she does not awaken to my presence.  Light stimulation both verbal and physical did not sufficiently awaken her.  No signs of distress noted.  She remains unable to participate in goals of care medical decision making independently at this time.  After assessing the patient, I counseled with RN.  Plan is to transfer patient to MedSurg bed when bed is clean and available.  RN had no acute concerns about patient at this time.  She endorses her mentation waxes and wanes, specifically citing the patient admitted for sacral and dressing confused for placement.  After counseling with RN, spoke with patient's husband Elsie over the phone.  Of note, patient and family are familiar to palliative medicine service has been following during previous hospitalizations.  To discuss diagnosis prognosis, GOC, EOL wishes, disposition and options.  I reiterated that palliative Medicine is specialized medical care for people living with serious illness. It focuses on providing relief from the  symptoms and stress of a serious illness. The goal is to improve quality of life for both the patient and the family.  As far as functional and nutritional status, Elsie endorses patient was doing well prior to this admission.  He shares he was surprised that she was readmitted to the hospital.  He understands that her blood counts were low.  Education provided on hemoglobin and symptomatic anemia.  Reviewed that patient's hemoglobin on 11/8 close 12.7-within normal limits.  When checked again on 11/25, patient's hemoglobin was 6.5.  To confirm this low level, I recheck revealed a hemoglobin of 7.1.  PRBCs were given and patient's hemoglobin rose to 8.5.  Reviewed that this is above the threshold where we get concerned for need of further blood products.  However, this is low.  Discussed potential causes of drop in hemoglobin such as a GI bleed.  Reviewed this is why patient is n.p.o. in case a test or procedure needs to occur.  Conveyed that gastroenterology consult is in place and should be speaking with family shortly in regards to options and the next steps.  Elsie shares appreciation for our discussion and is looking forward to talking to the GI specialist.  I again attempted to elicit bicycles important to patient and family.  Elsie shares he wants to figure out why she  has had such a drastic drop in her blood counts.  However, he does not want to do anything heroic or too aggressive.  He wishes for her to be able to continue to go to dialysis but is concerned that dialysis might be causing some of the drop in her blood levels.  Discussed her myeloproliferative disorder as well as end-stage renal disease are significant contributing factors to her drop in hemoglobin.  Elsie is appreciative of our discussion.  He is looking forward to speaking with GI.  I cautioned him that further investigation may involve more invasive procedures such as colonoscopy.  However, given her advanced age and  multiple comorbidities strong consideration should be given to the pros and cons of such treatments.  He endorsed understanding and says he will await recommendations from GI.  After speaking with the patient and Elsie, I counseled with attending.  Attending shares that she has spoken with patient's spouse who is in agreement with comfort focused care with outpatient palliative services.  Education provided on the difference between outpatient palliative services and hospice services.  Conveyed that a palliative medicine does not involve comfort focused care.  As per attending, plan remains for patient to be discharged today with outpatient palliative services to follow.  PMT remains available to the family throughout her hospitalization.  Primary Decision Maker NEXT OF KIN  Physical Exam Vitals reviewed.  Constitutional:      General: She is not in acute distress. HENT:     Head: Normocephalic.     Mouth/Throat:     Mouth: Mucous membranes are moist.  Pulmonary:     Effort: Pulmonary effort is normal.  Abdominal:     Palpations: Abdomen is soft.  Skin:    General: Skin is warm and dry.  Neurological:     Comments: Did not respond to verbal or light physical stimuli     Palliative Assessment/Data: 30%     Thank you for this consult. Palliative medicine will continue to follow and assist holistically.   75 minute visit includes: Detailed review of medical records (labs, imaging, vital signs), medically appropriate exam (mental status, respiratory, cardiac, skin), discussed with treatment team, counseling and educating patient, family and staff, documenting clinical information, medication management and coordination of care.  Signed by: Lamarr Gunner, DNP, FNP-BC Palliative Medicine   Please contact Palliative Medicine Team providers via [facility] for questions and concerns.

## 2024-08-20 NOTE — Progress Notes (Signed)
 [facility]  ROUNDING NOTE   Subjective:   Cassandra [name] is a 89 year old female with past medical conditions including anxiety, hypertension, RLS, neuropathy, and end-stage renal disease on hemodialysis.  Patient presents to the emergency department with abnormal labs and hypotension and has been admitted for GI bleeding [K92.2] Elevated troponin [R79.89] ESRD (end stage renal disease) on dialysis (HCC) [N18.6, Z99.2] Symptomatic anemia [D64.9] Gastrointestinal hemorrhage, unspecified gastrointestinal hemorrhage type [K92.2] Anemia, unspecified type [D64.9] Pressure injury of skin of sacral region, unspecified injury stage [L89.159]  Patient currently in rehab at [facility] where she receives dialysis on a MTWF schedule, supervised by Dr Marcelino.  Her last treatment was completed on Tuesday. She is seen resting in bed in ED. Appears comfortable, room air. Continues to have weakness, states this has not improved since previous discharge.   Labs on ED arrival significant for sodium 134, troponin 113, white count 21.1 with hemoglobin 6.5.  Patient did receive 1 unit blood transfusion in emergency department.  GI consulted.  We have been consulted to manage dialysis needs during this admission.  Objective:  Vital signs in last 24 hours:  Temp:  [97.6 F (36.4 C)-98.6 F (37 C)] 98 F (36.7 C) (11/26 1041) Pulse Rate:  [52-78] 52 (11/26 1041) Resp:  [12-20] 20 (11/26 1041) BP: (77-141)/(31-52) 137/52 (11/26 1041) SpO2:  [89 %-100 %] 97 % (11/26 1041) Weight:  [53.7 kg] 53.7 kg (11/25 1730)  Weight change:  Filed Weights   08/19/24 1730  Weight: 53.7 kg    Intake/Output: I/O last 3 completed shifts: In: 994.7 [I.V.:116.7; Blood:878] Out: -    Intake/Output this shift:  No intake/output data recorded.  Physical Exam: General: NAD  Head: Normocephalic, atraumatic. Moist oral mucosal membranes  Eyes: Anicteric  Lungs:  Clear to auscultation, normal effort   Heart: Regular rate and rhythm  Abdomen:  Soft, nontender  Extremities: No peripheral edema.  Neurologic: Awake and alert, oriented to self  Skin: Warm,dry, no rash  Access: Left upper aVF    Basic Metabolic Panel: Recent Labs  Lab 08/19/24 1757 08/19/24 2318 08/20/24 0119  NA 134*  --  133*  K 3.8  --  3.7  CL 96*  --  96*  CO2 32  --  33*  GLUCOSE 137*  --  83  BUN 30*  --  35*  CREATININE 2.28*  --  2.62*  CALCIUM  8.2*  --  7.6*  MG  --  1.8  --     Liver Function Tests: Recent Labs  Lab 08/19/24 1757  AST 27  ALT 12  ALKPHOS 53  BILITOT 0.2  PROT 5.5*  ALBUMIN  2.7*    No results for input(s): LIPASE, AMYLASE in the last 168 hours. No results for input(s): AMMONIA in the last 168 hours.  CBC: Recent Labs  Lab 08/19/24 1757 08/19/24 2318 08/20/24 0119 08/20/24 0839  WBC 21.1* 16.4* 16.4* 15.9*  NEUTROABS 19.1*  --   --   --   HGB 6.5* 7.1* 6.8* 8.5*  HCT 21.0* 22.1* 21.3* 26.8*  MCV 102.4* 98.2 97.7 94.4  PLT 676* 467* 567* 549*    Cardiac Enzymes: No results for input(s): CKTOTAL, CKMB, CKMBINDEX, TROPONINI in the last 168 hours.  BNP: Invalid input(s): POCBNP  CBG: Recent Labs  Lab 08/20/24 0755  GLUCAP 75    Microbiology: Results for orders placed or performed during the hospital encounter of 07/26/24  Blood culture (routine x 2)     Status: None  Collection Time: 07/26/24  8:11 PM   Specimen: BLOOD  Result Value Ref Range Status   Specimen Description BLOOD RIGHT ANTECUBITAL  Final   Special Requests   Final    BOTTLES DRAWN AEROBIC AND ANAEROBIC Blood Culture adequate volume   Culture   Final    NO GROWTH 5 DAYS Performed at [facility], [address] [place], [place] [address]    Report Status 07/31/2024 FINAL  Final  Resp panel by RT-PCR (RSV, Flu A&B, Covid) Anterior Nasal Swab     Status: None   Collection Time: 07/26/24  8:15 PM   Specimen: Anterior Nasal Swab  Result Value Ref Range  Status   SARS Coronavirus 2 by RT PCR NEGATIVE NEGATIVE Final    Comment: (NOTE) SARS-CoV-2 target nucleic acids are NOT DETECTED.  The SARS-CoV-2 RNA is generally detectable in upper respiratory specimens during the acute phase of infection. The lowest concentration of SARS-CoV-2 viral copies this assay can detect is 138 copies/mL. A negative result does not preclude SARS-Cov-2 infection and should not be used as the sole basis for treatment or other patient management decisions. A negative result may occur with  improper specimen collection/handling, submission of specimen other than nasopharyngeal swab, presence of viral mutation(s) within the areas targeted by this assay, and inadequate number of viral copies(<138 copies/mL). A negative result must be combined with clinical observations, patient history, and epidemiological information. The expected result is Negative.  Fact Sheet for Patients:  bloggercourse.com  Fact Sheet for Healthcare Providers:  seriousbroker.it  This test is no t yet approved or cleared by the [place]  FDA and  has been authorized for detection and/or diagnosis of SARS-CoV-2 by FDA under an Emergency Use Authorization (EUA). This EUA will remain  in effect (meaning this test can be used) for the duration of the COVID-19 declaration under Section 564(b)(1) of the Act, 21 U.S.C.section 360bbb-3(b)(1), unless the authorization is terminated  or revoked sooner.       Influenza A by PCR NEGATIVE NEGATIVE Final   Influenza B by PCR NEGATIVE NEGATIVE Final    Comment: (NOTE) The Xpert Xpress SARS-CoV-2/FLU/RSV plus assay is intended as an aid in the diagnosis of influenza from Nasopharyngeal swab specimens and should not be used as a sole basis for treatment. Nasal washings and aspirates are unacceptable for Xpert Xpress SARS-CoV-2/FLU/RSV testing.  Fact Sheet for  Patients: bloggercourse.com  Fact Sheet for Healthcare Providers: seriousbroker.it  This test is not yet approved or cleared by the [place]  FDA and has been authorized for detection and/or diagnosis of SARS-CoV-2 by FDA under an Emergency Use Authorization (EUA). This EUA will remain in effect (meaning this test can be used) for the duration of the COVID-19 declaration under Section 564(b)(1) of the Act, 21 U.S.C. section 360bbb-3(b)(1), unless the authorization is terminated or revoked.     Resp Syncytial Virus by PCR NEGATIVE NEGATIVE Final    Comment: (NOTE) Fact Sheet for Patients: bloggercourse.com  Fact Sheet for Healthcare Providers: seriousbroker.it  This test is not yet approved or cleared by the [place]  FDA and has been authorized for detection and/or diagnosis of SARS-CoV-2 by FDA under an Emergency Use Authorization (EUA). This EUA will remain in effect (meaning this test can be used) for the duration of the COVID-19 declaration under Section 564(b)(1) of the Act, 21 U.S.C. section 360bbb-3(b)(1), unless the authorization is terminated or revoked.  Performed at [facility], [address], [place], [place] [address]   Blood  culture (routine x 2)     Status: None   Collection Time: 07/26/24  8:16 PM   Specimen: BLOOD  Result Value Ref Range Status   Specimen Description BLOOD RIGHT ANTECUBITAL  Final   Special Requests   Final    BOTTLES DRAWN AEROBIC AND ANAEROBIC Blood Culture results may not be optimal due to an inadequate volume of blood received in culture bottles   Culture   Final    NO GROWTH 5 DAYS Performed at [facility], [address], [place], [place] [address]    Report Status 07/31/2024 FINAL  Final  MRSA Next Gen by PCR, Nasal     Status: None   Collection Time: 07/27/24  5:00 PM   Specimen: Nasal Mucosa; Nasal  Swab  Result Value Ref Range Status   MRSA by PCR Next Gen NOT DETECTED NOT DETECTED Final    Comment: (NOTE) The GeneXpert MRSA Assay (FDA approved for NASAL specimens only), is one component of a comprehensive MRSA colonization surveillance program. It is not intended to diagnose MRSA infection nor to guide or monitor treatment for MRSA infections. Test performance is not FDA approved in patients less than 40 years old. Performed at [facility], [address] [place], [place] [address]   Gastrointestinal Panel by PCR , Stool     Status: None   Collection Time: 08/01/24  4:32 PM   Specimen: Stool  Result Value Ref Range Status   Campylobacter species NOT DETECTED NOT DETECTED Final   Plesimonas shigelloides NOT DETECTED NOT DETECTED Final   Salmonella species NOT DETECTED NOT DETECTED Final   Yersinia enterocolitica NOT DETECTED NOT DETECTED Final   Vibrio species NOT DETECTED NOT DETECTED Final   Vibrio cholerae NOT DETECTED NOT DETECTED Final   Enteroaggregative E coli (EAEC) NOT DETECTED NOT DETECTED Final   Enteropathogenic E coli (EPEC) NOT DETECTED NOT DETECTED Final   Enterotoxigenic E coli (ETEC) NOT DETECTED NOT DETECTED Final   Shiga like toxin producing E coli (STEC) NOT DETECTED NOT DETECTED Final   Shigella/Enteroinvasive E coli (EIEC) NOT DETECTED NOT DETECTED Final   Cryptosporidium NOT DETECTED NOT DETECTED Final   Cyclospora cayetanensis NOT DETECTED NOT DETECTED Final   Entamoeba histolytica NOT DETECTED NOT DETECTED Final   Giardia lamblia NOT DETECTED NOT DETECTED Final   Adenovirus F40/41 NOT DETECTED NOT DETECTED Final   Astrovirus NOT DETECTED NOT DETECTED Final   Norovirus GI/GII NOT DETECTED NOT DETECTED Final   Rotavirus A NOT DETECTED NOT DETECTED Final   Sapovirus (I, II, IV, and V) NOT DETECTED NOT DETECTED Final    Comment: Performed at [facility], [address] [place], [place] [address]    Coagulation Studies: Recent  Labs    08/19/24 1757  LABPROT 13.6  INR 1.0     Urinalysis: No results for input(s): COLORURINE, LABSPEC, PHURINE, GLUCOSEU, HGBUR, BILIRUBINUR, KETONESUR, PROTEINUR, UROBILINOGEN, NITRITE, LEUKOCYTESUR in the last 72 hours.  Invalid input(s): APPERANCEUR    Imaging: DG Chest 1 View Result Date: 08/19/2024 EXAM: 1 VIEW(S) XRAY OF THE CHEST 08/19/2024 07:04:00 PM COMPARISON: 08/05/2024 CLINICAL HISTORY: elevated wbc FINDINGS: LUNGS AND PLEURA: Left basilar atelectasis or infiltrate. No pleural effusion. No pneumothorax. HEART AND MEDIASTINUM: Aortic atherosclerosis. No acute abnormality of the cardiac and mediastinal silhouettes. BONES AND SOFT TISSUES: No acute osseous abnormality. IMPRESSION: 1. Left basilar atelectasis or infiltrate. 2. Aortic atherosclerosis. Electronically signed by: Franky Crease MD 08/19/2024 07:16 PM EST RP Workstation: [id number]  Medications:      acidophilus  2 capsule Oral BID   calcium  acetate  1,334 mg Oral TID WC   gabapentin   100 mg Oral BID   And   gabapentin   200 mg Oral QHS   Gerhardt's butt cream  1 Application Topical TID   hydroxyurea   300 mg Oral Daily   latanoprost   1 drop Both Eyes QHS   levothyroxine   88 mcg Oral Q0600   pantoprazole  (PROTONIX ) IV  40 mg Intravenous Q12H   rosuvastatin   10 mg Oral Daily   senna-docusate  1 tablet Oral BID   albuterol , ALPRAZolam , dextromethorphan-guaiFENesin , diphenhydrAMINE , hydrALAZINE , HYDROcodone -acetaminophen , melatonin  Assessment/ Plan:  Cassandra [name] is a 89 y.o.  female with past medical conditions including anxiety, hypertension, RLS, neuropathy, and end-stage renal disease on hemodialysis.  Patient presents to the emergency department with weakness and confusion and has been admitted for GI bleeding [K92.2] Elevated troponin [R79.89] ESRD (end stage renal disease) on dialysis (HCC) [N18.6, Z99.2] Symptomatic anemia [D64.9] Gastrointestinal hemorrhage,  unspecified gastrointestinal hemorrhage type [K92.2] Anemia, unspecified type [D64.9] Pressure injury of skin of sacral region, unspecified injury stage [L89.159]  [facility] MTWF/left AVF  Anemia of chronic kidney disease with suspected acute blood loss Lab Results  Component Value Date   HGB 8.5 (L) 08/20/2024    Hemoglobin 6.5 on ED arrival, patient did receive blood transfusion in emergency department.  Currently 8.5.  GI has been consulted.  2. End-stage renal disease on hemodialysis.  Patient received last treatment on Tuesday.  Offered treatment today however patient refused.  Next treatment scheduled for Friday.  3. Secondary Hyperparathyroidism: with outpatient labs: None available Lab Results  Component Value Date   CALCIUM  7.6 (L) 08/20/2024   CAION 1.11 (L) 05/25/2023   PHOS 3.3 07/31/2024  Will continue to monitor bone minerals during this admission. Continue calcium  acetate with meals.   4. Hypertension with chronic kidney disease. Home regimen includes amlodipine  and clonidine .  All currently held   LOS: 0 Tabathia Knoche 11/26/202511:39 AM

## 2024-08-20 NOTE — Hospital Course (Addendum)
 Partly taken from prior notes.  Cassandra [name] is a 89 y.o. female with medical history significant of ESRD-HD(TTS),  HTN, HLD, DM, CAD, hypothyroidism, GI bleeding, depression with anxiety, JAK2 mutation with positive myeloproliferative neoplasm and polycythemia vera, thrombocytosis, chronic leukocytosis, pleural effusion, RLS, who presents with abnormal lab with low hemoglobin.   Patient was recently hospitalized from 11/2 - 11/12 due to sepsis secondary to multifocal pneumonia and acute stroke.  Patient was discharged on Plavix  and aspirin .  Per facility record, patient took last dose aspirin  this morning (11/25) and Plavix  yesterday afternoon at 5:48 (11/24). She had dialysis this morning per patient.  Patient was found to have a hemoglobin of 6.5 in ED, no obvious bleeding, no melena, hematuria or hematochezia.  ED provider noted some melena by rectal exam.  Patient has also hypotensive with systolic in 7980s and received a small bolus by EMS.  On presentation her labs were WBC 21.1, hemoglobin 6.5, INR 1.0, potassium 3.8, bicarb 32, creatinine 2.28.  CXR with left basilar atelectasis or infiltrate, EKG without any acute changes.  Patient was given 2 unit of PRBC, GI and nephrology was consulted.  11/26: Vital stable with intermittent softer blood pressure overnight, labs with WBC of 15.9, hemoglobin improved to 8.5 s/p 2 unit PRBC.  There is no active bleeding noted.  Patient was on DAPT for the past 1 month, discontinuing Plavix  and she will continue on aspirin  only.  Acute on chronic anemia likely multifactorial as patient is on dialysis and history of myeloproliferative disorder.  Palliative care was also consulted and discussed with husband they do not want any procedures. Small occult GI bleed cannot be ruled out at this time.  No obvious GI bleeding.  Mildly elevated troponin with a flat curve, likely secondary to demand ischemia.  No chest pain.  Patient need to be followed up by  outpatient palliative care.  Multiple significant comorbidities and advanced age.  Patient will continue on current medications and wound care.  She need to follow-up with her providers for further assistance.

## 2024-08-20 NOTE — NC FL2 (Signed)
 [place]  MEDICAID FL2 LEVEL OF CARE FORM     IDENTIFICATION  Patient Name: Cassandra [name] Birthdate: May 23, 1927 Sex: female Admission Date (Current Location): 08/19/2024  [place] and [place] Number:  Chiropodist and Address:  [facility], [address], [place], [place] [address]      Provider Number: [id number]  Attending Physician Name and Address:  Caleen Qualia, MD  Relative Name and Phone Number:       Current Level of Care:   Recommended Level of Care:   Prior Approval Number:    Date Approved/Denied:   PASRR Number: [id number]  Discharge Plan: SNF    Current Diagnoses: Patient Active Problem List   Diagnosis Date Noted   Elevated troponin 08/20/2024   Symptomatic anemia 08/19/2024   Type II diabetes mellitus with renal manifestations (HCC) 08/19/2024   Myocardial injury 08/19/2024   Depression with anxiety 08/19/2024   Pleural effusion 08/19/2024   GI bleeding 08/19/2024   Sacral ulcer (HCC) 08/19/2024   Prolonged QT interval 08/19/2024   NSTEMI (non-ST elevated myocardial infarction) (HCC) 07/27/2024   Essential hypertension 07/27/2024   Hypothyroidism 07/27/2024   Dyslipidemia 07/27/2024   Depression 07/27/2024   Sepsis due to pneumonia (HCC) 07/26/2024   Stroke (HCC) 07/16/2024   ESRD on dialysis (HCC) 07/16/2024   Mechanical complication of AV shunt 06/01/2024   Acute hypoxemic respiratory failure (HCC) 04/08/2024   Complication of AV dialysis fistula, initial encounter 08/06/2023   Renal failure 03/06/2023   Acute hypoxic respiratory failure (HCC) 03/05/2023   Pneumonia 03/05/2023   ESRD (end stage renal disease) on dialysis (HCC) 03/05/2023   Hyperkalemia 03/05/2023   Overweight (BMI 25.0-29.9) 12/14/2021   Acute renal failure superimposed on stage 4 chronic kidney disease (HCC) 12/14/2021   Hematochezia, recurrent 12/11/2021   Recent cerebrovascular accident 10/12/2021 (CVA) 12/11/2021   Hypotension  12/11/2021   GI bleed 12/11/2021   Acute blood loss anemia 12/11/2021   Cholelithiasis 12/11/2021   Acute CVA (cerebrovascular accident) (HCC) 10/13/2021   Leukocytosis 10/13/2021   Rectal bleeding 10/03/2021   Bright red rectal bleeding 10/02/2021   GIB (gastrointestinal bleeding) 10/02/2021   Hypoxemia 05/15/2021   Hypoxia 05/14/2021   Hypertensive urgency 05/14/2021   Tibial plateau fracture 04/21/21, left, closed, with routine healing, subsequent encounter 05/14/2021   CKD (chronic kidney disease) stage 4, GFR 15-29 ml/min (HCC) 04/07/2021   Acquired hypothyroidism 12/02/2020   Hyperlipidemia, mixed 12/02/2020   Aortic atherosclerosis 10/04/2020   Polycythemia vera (HCC) 06/02/2020   Thrombocytosis 02/18/2020   Vitamin D  deficiency 03/25/2018   Hypertension 01/24/2015   Diabetes mellitus, type II (HCC) 01/24/2015   Restless legs syndrome 01/24/2015   Chronic constipation 01/24/2015   Lumbar stenosis with neurogenic claudication 05/04/2014   Peripheral neuropathy 03/09/2014   CAD (coronary artery disease), native coronary artery 01/07/2014    Orientation RESPIRATION BLADDER Height & Weight     Self  O2 (two liters nasal cannula) Continent Weight: 118 lb 6.2 oz (53.7 kg) Height:  5' 1 (154.9 cm)  BEHAVIORAL SYMPTOMS/MOOD NEUROLOGICAL BOWEL NUTRITION STATUS      Incontinent    AMBULATORY STATUS COMMUNICATION OF NEEDS Skin   Extensive Assist Verbally Normal                       Personal Care Assistance Level of Assistance  Bathing, Feeding, Dressing Bathing Assistance: Maximum assistance Feeding assistance: Maximum assistance Dressing Assistance: Maximum assistance     Functional Limitations Info  Sight Info: Adequate Hearing Info: Adequate Speech Info: Adequate    SPECIAL CARE FACTORS FREQUENCY  PT (By licensed PT), OT (By licensed OT)     PT Frequency: 5x OT Frequency: 5x            Contractures      Additional Factors Info    Code Status  Info: DNR Limited Allergies Info: Atorvastatin Cyclobenzaprine   Mirtazapine  Oxycodone-acetaminophen  Paroxetine Hcl   PenicillinsSwelling  PropoxypheneOther (See  TrazodoneOther           Current Medications (08/20/2024):  This is the current hospital active medication list Current Facility-Administered Medications  Medication Dose Route Frequency Provider Last Rate Last Admin   acidophilus (RISAQUAD) capsule 2 capsule  2 capsule Oral BID Niu, Xilin, MD       albuterol  (PROVENTIL ) (2.5 MG/3ML) 0.083% nebulizer solution 2.5 mg  2.5 mg Inhalation Q4H PRN Niu, Xilin, MD       ALPRAZolam  (XANAX ) tablet 0.25 mg  0.25 mg Oral BID PRN Niu, Xilin, MD       calcium  acetate (PHOSLO ) capsule 1,334 mg  1,334 mg Oral TID WC Niu, Xilin, MD       dextromethorphan-guaiFENesin  (MUCINEX  DM) 30-600 MG per 12 hr tablet 1 tablet  1 tablet Oral BID PRN Niu, Xilin, MD       diphenhydrAMINE  (BENADRYL ) injection 12.5 mg  12.5 mg Intravenous Q8H PRN Niu, Xilin, MD       gabapentin  (NEURONTIN ) capsule 100 mg  100 mg Oral BID Dail Rankin RAMAN, RPH       And   gabapentin  (NEURONTIN ) capsule 200 mg  200 mg Oral QHS Belue, Nathan S, RPH       Gerhardt's butt cream 1 Application  1 Application Topical TID Amin, Sumayya, MD       hydrALAZINE  (APRESOLINE ) injection 5 mg  5 mg Intravenous Q2H PRN Niu, Xilin, MD       HYDROcodone -acetaminophen  (NORCO/VICODIN) 5-325 MG per tablet 1 tablet  1 tablet Oral Q6H PRN Niu, Xilin, MD   1 tablet at 08/20/24 1228   hydroxyurea  (DROXIA ) capsule 300 mg  300 mg Oral Daily Niu, Xilin, MD       latanoprost  (XALATAN ) 0.005 % ophthalmic solution 1 drop  1 drop Both Eyes QHS Niu, Xilin, MD       levothyroxine  (SYNTHROID ) tablet 88 mcg  88 mcg Oral Q0600 Niu, Xilin, MD   88 mcg at 08/20/24 0517   melatonin tablet 5 mg  5 mg Oral QHS PRN Duncan, Hazel V, MD   5 mg at 08/19/24 2233   pantoprazole  (PROTONIX ) injection 40 mg  40 mg Intravenous Q12H Niu, Xilin, MD   40 mg at 08/20/24 9482    rosuvastatin  (CRESTOR ) tablet 10 mg  10 mg Oral Daily Niu, Xilin, MD       senna-docusate (Senokot-S) tablet 1 tablet  1 tablet Oral BID Niu, Xilin, MD   1 tablet at 08/19/24 2211     Discharge Medications: Please see discharge summary for a list of discharge medications.  Relevant Imaging Results:  Relevant Lab Results:   Additional Information [phone], [place]

## 2024-08-21 LAB — BPAM RBC
Blood Product Expiration Date: 202512282359
Blood Product Expiration Date: 202512282359
ISSUE DATE / TIME: 202511260126
ISSUE DATE / TIME: 202511251943
Unit Type and Rh: 5100
Unit Type and Rh: 5100

## 2024-08-21 LAB — TYPE AND SCREEN
ABO/RH(D): O POS
Antibody Screen: NEGATIVE
Unit division: 0
Unit division: 0

## 2024-08-27 ENCOUNTER — Inpatient Hospital Stay: Admitting: Oncology

## 2024-08-27 ENCOUNTER — Inpatient Hospital Stay

## 2024-08-28 ENCOUNTER — Inpatient Hospital Stay

## 2024-08-28 ENCOUNTER — Encounter: Payer: Self-pay | Admitting: Oncology

## 2024-08-28 ENCOUNTER — Inpatient Hospital Stay: Attending: Oncology | Admitting: Oncology

## 2024-08-28 VITALS — BP 95/60 | HR 76 | Temp 97.9°F | Resp 18

## 2024-08-28 DIAGNOSIS — D45 Polycythemia vera: Secondary | ICD-10-CM | POA: Insufficient documentation

## 2024-08-28 DIAGNOSIS — D649 Anemia, unspecified: Secondary | ICD-10-CM

## 2024-08-28 DIAGNOSIS — D72829 Elevated white blood cell count, unspecified: Secondary | ICD-10-CM | POA: Diagnosis not present

## 2024-08-28 DIAGNOSIS — I120 Hypertensive chronic kidney disease with stage 5 chronic kidney disease or end stage renal disease: Secondary | ICD-10-CM | POA: Diagnosis not present

## 2024-08-28 DIAGNOSIS — Z87891 Personal history of nicotine dependence: Secondary | ICD-10-CM | POA: Diagnosis not present

## 2024-08-28 DIAGNOSIS — E1122 Type 2 diabetes mellitus with diabetic chronic kidney disease: Secondary | ICD-10-CM | POA: Insufficient documentation

## 2024-08-28 DIAGNOSIS — Z8673 Personal history of transient ischemic attack (TIA), and cerebral infarction without residual deficits: Secondary | ICD-10-CM | POA: Diagnosis not present

## 2024-08-28 DIAGNOSIS — Z79899 Other long term (current) drug therapy: Secondary | ICD-10-CM | POA: Insufficient documentation

## 2024-08-28 DIAGNOSIS — Z7982 Long term (current) use of aspirin: Secondary | ICD-10-CM | POA: Insufficient documentation

## 2024-08-28 DIAGNOSIS — N186 End stage renal disease: Secondary | ICD-10-CM | POA: Diagnosis not present

## 2024-08-28 DIAGNOSIS — I639 Cerebral infarction, unspecified: Secondary | ICD-10-CM

## 2024-08-28 DIAGNOSIS — D75839 Thrombocytosis, unspecified: Secondary | ICD-10-CM | POA: Insufficient documentation

## 2024-08-28 DIAGNOSIS — Z992 Dependence on renal dialysis: Secondary | ICD-10-CM | POA: Insufficient documentation

## 2024-08-28 DIAGNOSIS — D631 Anemia in chronic kidney disease: Secondary | ICD-10-CM

## 2024-08-28 LAB — CBC WITH DIFFERENTIAL/PLATELET
Basophils Absolute: 0 K/uL (ref 0.0–0.1)
Basophils Relative: 0 %
Eosinophils Absolute: 0 K/uL (ref 0.0–0.5)
Eosinophils Relative: 0 %
HCT: 35.5 % — ABNORMAL LOW (ref 36.0–46.0)
Hemoglobin: 11.2 g/dL — ABNORMAL LOW (ref 12.0–15.0)
Lymphocytes Relative: 5 %
Lymphs Abs: 1 K/uL (ref 0.7–4.0)
MCH: 30.4 pg (ref 26.0–34.0)
MCHC: 31.5 g/dL (ref 30.0–36.0)
MCV: 96.2 fL (ref 80.0–100.0)
Monocytes Absolute: 0.6 K/uL (ref 0.1–1.0)
Monocytes Relative: 3 %
Neutro Abs: 18.8 K/uL — ABNORMAL HIGH (ref 1.7–7.7)
Neutrophils Relative %: 92 %
Platelets: 715 K/uL — ABNORMAL HIGH (ref 150–400)
RBC: 3.69 MIL/uL — ABNORMAL LOW (ref 3.87–5.11)
RDW: 20.1 % — ABNORMAL HIGH (ref 11.5–15.5)
WBC: 20.4 K/uL — ABNORMAL HIGH (ref 4.0–10.5)
nRBC: 0 % (ref 0.0–0.2)

## 2024-08-28 LAB — RETIC PANEL
Immature Retic Fract: 6.6 % (ref 2.3–15.9)
RBC.: 3.66 MIL/uL — ABNORMAL LOW (ref 3.87–5.11)
Retic Count, Absolute: 87.1 K/uL (ref 19.0–186.0)
Retic Ct Pct: 2.4 % (ref 0.4–3.1)
Reticulocyte Hemoglobin: 27.7 pg — ABNORMAL LOW (ref >27.9)

## 2024-08-28 LAB — IRON AND TIBC
Iron: 29 ug/dL (ref 28–170)
Saturation Ratios: 13 % (ref 10.4–31.8)
TIBC: 218 ug/dL — ABNORMAL LOW (ref 250–450)
UIBC: 189 ug/dL

## 2024-08-28 LAB — FERRITIN: Ferritin: 262 ng/mL (ref 11–307)

## 2024-08-28 LAB — LACTATE DEHYDROGENASE: LDH: 477 U/L — ABNORMAL HIGH (ref 105–235)

## 2024-08-28 NOTE — Assessment & Plan Note (Addendum)
Continue Aspirin 81 mg daily

## 2024-08-28 NOTE — Assessment & Plan Note (Signed)
 Hemoglobin has improved. Continue Epo replacement, managed by nephrology

## 2024-08-28 NOTE — Assessment & Plan Note (Signed)
 She has been on Hydroxyurea  300mg  daily.  Hb has improved since discontinuation of plavix  at discharge.  Persistent leukocytosis and thrombocytosis  Discussed about bone marrow biopsy for evaluation, she declined.  Could consider increase hydroxyurea  dosage to 500mg  daily.  She has multiple co morbidities and has not been complaint with follow up previously.  Will refer her to establish care with palliative care to discuss goals of care.

## 2024-08-28 NOTE — Progress Notes (Signed)
 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  Hb has improved since discontinuation of plavix  at discharge.  Persistent leukocytosis and thrombocytosis  Discussed about bone marrow biopsy for evaluation, she declined.  Could consider increase hydroxyurea  dosage to 500mg  daily.  She has multiple co morbidities and has not been complaint with follow up previously.  Will refer her to establish care with palliative care to discuss goals of care.    Acute CVA (cerebrovascular accident) (HCC) Continue Aspirin  81mg  daily  Anemia in ESRD (end-stage renal disease) (HCC) Hemoglobin has improved. Continue Epo replacement, managed by nephrology  Orders Placed This Encounter  Procedures   CBC with Differential/Platelet    Standing Status:   Future    Number of Occurrences:   1    Expected Date:   08/28/2024    Expiration Date:   08/28/2025   Retic Panel    Standing Status:   Future    Number of Occurrences:   1    Expected Date:   08/28/2024    Expiration Date:   08/28/2025   Ferritin    Standing Status:   Future    Number of Occurrences:   1    Expected Date:   08/28/2024    Expiration Date:   02/26/2025   Iron and TIBC    Standing Status:   Future    Number of Occurrences:   1    Expected Date:   08/28/2024    Expiration Date:   08/28/2025   Flow cytometry panel-leukemia/lymphoma work-up    Standing Status:   Future    Number of Occurrences:   1    Expected Date:   08/28/2024    Expiration Date:   08/28/2025   Lactate dehydrogenase    Standing Status:   Future    Number of Occurrences:   1    Expected Date:    08/28/2024    Expiration Date:   08/28/2025   Technologist smear review    Standing Status:   Future    Expected Date:   08/28/2024    Expiration Date:   08/28/2025    Clinical information::   leukocytosis anemia, history of PV   Haptoglobin    Standing Status:   Future    Number of Occurrences:   1    Expected Date:   08/28/2024    Expiration Date:   08/28/2025   Follow up in 1 month All questions were answered. The patient knows to call the clinic with any problems, questions or concerns.  Call Babara, MD, PhD [facility] Hematology Oncology 08/28/2024   HISTORY OF PRESENTING ILLNESS:  Cassandra [name] is a 89 y.o. female who was seen in consultation at the request of Cleotilde Oneil FALCON, MD/ Dr.Singh Harmeet for evaluation of thrombocytosis  Reviewed patient's labs.  01/30/2020 labs showed elevated platelet counts at   619,000. wbc 12.7, and hemoglobin 14.9. Reviewed patient's previous labs. Thrombocytosis onset is chronic onset , duration since at least  04/14/2019 No aggravating or elevated factors. Associated symptoms or signs:  Denies weight loss, fever, chills, fatigue, night sweats.  Context:  Smoking history: Former smoker.  She quitted smoking in  43s Family history of polycythemia.  Denies History of iron deficiency anemia; reports remote history of iron deficiency. History of DVT: Denies  +JAK2 V617 mutation 03/15/2020 Bone marrow biopsy results were reviewed and discussed. Patient has erythrocytosis and thrombocytosis,  bone marrow showed no blast, hypercellular bone marrow with atypical megakaryocytic proliferation.  This is compatible with myeloproliferative neoplasm, ET versus polycythemia vera.  Cytogenetics showed normal karyotype.   Patient was started on hydroxyurea  and she is not complaint in follow up .  Hydroxyurea  has been managed by her PCP.  Today she presents to re- establish care.  She is a poor historian. She is here by herself. I attempted to call her  husband and was not able to reach.   She has ESRD and has been on HD.  07/27/24- 08/06/24 due to sepsis secondary to multifocal pneumonia and acute stroke. Patient was discharged on Plavix  and aspirin .  08/19/2024 - 08/20/2024 hospitalization due to symptomatic anemia. Hb 6.5, s.p PRBC transfusion. Plavis was discontinued and she is on Aspirin  81mg  daily. She denies rectal bleeding.  She has chronic respiratory failure and is on nasal cannula oxygen.   Review of Systems  Unable to perform ROS: Age (hard hearing, poor historian)  Constitutional:  Positive for fatigue.    MEDICAL HISTORY:  Past Medical History:  Diagnosis Date   Anxiety    Aortic atherosclerosis    Arthritis    osteoarthritis in hands, knees ( R torn cartilage, L meniscus removed)    Atherosclerosis of native coronary artery of native heart with unstable angina pectoris (HCC)    CAD (coronary artery disease) 1999   a.) s/p PCI (details unkown) in [place]  in 1999   Campylobacter antigen positive 11/29/2014   a.) presented to [facility] ED with N/V/D; stool culture (+)   ESRD on hemodialysis (HCC)    a.) T-Th-Sat   Glaucoma    Hypercholesterolemia    Hyperlipidemia    Hypertension    Hypothyroidism    Lumbar stenosis with neurogenic claudication    Perforation of colon as colonoscopy complication (HCC) 2010   a.) required short term colostomy with subsequent take down/reversal   RLS (restless legs syndrome)    Thrombocytosis    Type 2 diabetes mellitus with chronic kidney disease (HCC)     SURGICAL HISTORY: Past Surgical History:  Procedure Laterality Date   A/V FISTULAGRAM Left 12/25/2023   Procedure: A/V Fistulagram;  Surgeon: Jama Cordella MATSU, MD;  Location: [facility] INVASIVE CV LAB;  Service: Cardiovascular;  Laterality: Left;   ABDOMINAL HYSTERECTOMY     left ovary removed   AV FISTULA PLACEMENT Left 05/25/2023   Procedure: ARTERIOVENOUS (AV) FISTULA CREATION (BRACHIALCEPHALIC);  Surgeon: Jama Cordella MATSU,  MD;  Location: [facility] ORS;  Service: Vascular;  Laterality: Left;   BLEPHAROPLASTY Bilateral    CATARACT EXTRACTION W/ INTRAOCULAR LENS  IMPLANT, BILATERAL     COLONOSCOPY  2010   COLOSTOMY  03/2009   perforated colon from colonoscopy   COLOSTOMY REVERSAL  08/2009   CORONARY ANGIOPLASTY WITH STENT PLACEMENT  1999   DIALYSIS/PERMA CATHETER INSERTION N/A 03/08/2023   Procedure: DIALYSIS/PERMA CATHETER INSERTION;  Surgeon: Marea Selinda RAMAN, MD;  Location: [facility] INVASIVE CV LAB;  Service: Cardiovascular;  Laterality: N/A;   DIALYSIS/PERMA CATHETER REMOVAL N/A 09/05/2023   Procedure: DIALYSIS/PERMA CATHETER REMOVAL;  Surgeon: Marea Selinda RAMAN, MD;  Location: [facility] INVASIVE CV LAB;  Service: Cardiovascular;  Laterality: N/A;   FOOT SURGERY Bilateral    toes correction   KNEE ARTHROSCOPY Right  torn cartilage   KNEE ARTHROSCOPY W/ MENISCECTOMY Left 01/2012   TEMPORARY DIALYSIS CATHETER N/A 03/05/2023   Procedure: TEMPORARY DIALYSIS CATHETER;  Surgeon: Marea Selinda RAMAN, MD;  Location: [facility] INVASIVE CV LAB;  Service: Cardiovascular;  Laterality: N/A;    SOCIAL HISTORY: Social History   Socioeconomic History   Marital status: Married    Spouse name: Elsie   Number of children: 3   Years of education: Not on file   Highest education level: Not on file  Occupational History   Not on file  Tobacco Use   Smoking status: Former    Current packs/day: 0.00    Types: Cigarettes    Quit date: 01/21/1980    Years since quitting: 44.6   Smokeless tobacco: Never  Vaping Use   Vaping status: Never Used  Substance and Sexual Activity   Alcohol use: Yes    Alcohol/week: 0.0 standard drinks of alcohol    Comment: occasionally   Drug use: No   Sexual activity: Not Currently  Other Topics Concern   Not on file  Social History Narrative   Lives with spouse elsie in an apt.    Social Drivers of Corporate Investment Banker Strain: Low Risk  (06/25/2024)   Received from [facility]    Overall Financial Resource Strain (CARDIA)    Difficulty of Paying Living Expenses: Not hard at all  Food Insecurity: No Food Insecurity (08/20/2024)   Hunger Vital Sign    Worried About Running Out of Food in the Last Year: Never true    Ran Out of Food in the Last Year: Never true  Transportation Needs: No Transportation Needs (08/20/2024)   PRAPARE - Administrator, Civil Service (Medical): No    Lack of Transportation (Non-Medical): No  Physical Activity: Not on file  Stress: Not on file  Social Connections: Moderately Isolated (08/20/2024)   Social Connection and Isolation Panel    Frequency of Communication with Friends and Family: Once a week    Frequency of Social Gatherings with Friends and Family: Once a week    Attends Religious Services: 1 to 4 times per year    Active Member of [employer] or Organizations: No    Attends Banker Meetings: Never    Marital Status: Married  Catering Manager Violence: Not At Risk (08/20/2024)   Humiliation, Afraid, Rape, and Kick questionnaire    Fear of Current or Ex-Partner: No    Emotionally Abused: No    Physically Abused: No    Sexually Abused: No    FAMILY HISTORY: Family History  Problem Relation Age of Onset   Hypertension Mother    Hypertension Father    Early death Father    Heart disease Father     ALLERGIES:  is allergic to atorvastatin, cyclobenzaprine, mirtazapine, oxycodone-acetaminophen , paroxetine hcl, penicillins, propoxyphene, and trazodone .  MEDICATIONS:  Current Outpatient Medications  Medication Sig Dispense Refill   albuterol  (VENTOLIN  HFA) 108 (90 Base) MCG/ACT inhaler Inhale 2 puffs into the lungs every 6 (six) hours as needed for shortness of breath.     ALPRAZolam  (XANAX ) 0.25 MG tablet Take 1 tablet (0.25 mg total) by mouth daily as needed. 10 tablet 0   amLODipine  (NORVASC ) 5 MG tablet Take 1 tablet (5 mg total) by mouth daily.     aspirin  81 MG chewable tablet Chew 1 tablet by  mouth daily.     cetirizine (ZYRTEC) 10 MG tablet Take 1 tablet by mouth daily as  needed for allergies.     CRYODOSE TA AERO Apply 1 Application topically as directed. SPRAY 1 AS DIRECTED FOR USE DURING DIALYSIS TO ACCESS     [employer] D3 25 MCG (1000 UT) capsule Take 1,000 Units by mouth daily.     gabapentin  (NEURONTIN ) 100 MG capsule Take 1-2 capsules (100-200 mg total) by mouth 3 (three) times daily. 100 mg am 100 mg afternoon 200 mg bedtime. Home med.     glucose blood test strip Use once daily. Use as instructed.     HYDROcodone -acetaminophen  (NORCO/VICODIN) 5-325 MG tablet Take 1 tablet by mouth 3 (three) times daily as needed for moderate pain (pain score 4-6) or severe pain (pain score 7-10). 20 tablet 0   Hydroxyurea  (SIKLOS ) 100 MG TABS Take 3 tablets by mouth every evening.     hydrOXYzine  (ATARAX ) 10 MG tablet Take 10 mg by mouth daily as needed.     latanoprost  (XALATAN ) 0.005 % ophthalmic solution Place 1 drop into both eyes at bedtime.     levothyroxine  (SYNTHROID ) 88 MCG tablet Take 88 mcg by mouth daily. Take on an empty stomach 30 to 60 minutes before breakfast     Nutritional Supplements (FEEDING SUPPLEMENT, NEPRO CARB STEADY,) LIQD Take 237 mLs by mouth 2 (two) times daily between meals.  0   Nystatin (GERHARDT'S BUTT CREAM) CREA Apply 1 Application topically 3 (three) times daily.     polyethylene glycol (HEALTHYLAX) 17 g packet Take 1 packet by mouth daily.     rOPINIRole  (REQUIP ) 0.5 MG tablet Take 1 tablet (0.5 mg total) by mouth in the morning and at bedtime. 180 tablet 3   rosuvastatin  (CRESTOR ) 10 MG tablet Take 1 tablet (10 mg total) by mouth daily. Reduced from 20 mg to 10 mg, due to ESRD.     sertraline  (ZOLOFT ) 50 MG tablet Take 50 mg by mouth daily.     timolol  (TIMOPTIC -XR) 0.5 % ophthalmic gel-forming Place 1 drop into both eyes every morning.     acidophilus (RISAQUAD) CAPS capsule Take 2 capsules by mouth 2 (two) times daily. (Patient not taking: Reported on  08/28/2024)     azelastine  (ASTELIN ) 0.1 % nasal spray Place 2 sprays into the nose 2 (two) times daily. (Patient not taking: Reported on 08/28/2024)     benzocaine  (ORAJEL) 10 % mucosal gel Use as directed in the mouth or throat 3 (three) times daily as needed for mouth pain. (Patient not taking: Reported on 08/28/2024)     betamethasone valerate (VALISONE) 0.1 % cream Apply 1 Application topically daily. (Patient not taking: Reported on 08/28/2024)     calcium  acetate (PHOSLO ) 667 MG capsule Take 1,334 mg by mouth 3 (three) times daily. (Patient not taking: Reported on 08/28/2024)     [Paused] cloNIDine  (CATAPRES ) 0.1 MG tablet Take 0.1 mg by mouth 2 (two) times daily. (Patient not taking: Reported on 08/28/2024)     DROXIA  300 MG capsule TAKE 1 CAPSULE (300 MG TOTAL) BY MOUTH DAILY. MAY TAKE WITH FOOD TO MINIMIZE GI SIDE EFFECTS. (Patient not taking: Reported on 08/28/2024) 30 capsule 0   lidocaine -prilocaine  (EMLA ) cream Apply 1 Application topically as needed. (Patient not taking: Reported on 08/28/2024) 30 g 6   naloxone (NARCAN) nasal spray 4 mg/0.1 mL Place 4 mg into the nose once. (Patient not taking: Reported on 08/28/2024)     pantoprazole  (PROTONIX ) 40 MG tablet Take 1 tablet by mouth daily. (Patient not taking: Reported on 08/28/2024)     traZODone  (DESYREL ) 50 MG  tablet Take 1 tablet (50 mg total) by mouth at bedtime as needed for sleep. Home med. (Patient not taking: Reported on 08/28/2024)     No current facility-administered medications for this visit.     PHYSICAL EXAMINATION:  Vitals:   08/28/24 1139  BP: 95/60  Pulse: 76  Resp: 18  Temp: 97.9 F (36.6 C)  SpO2: 98%   There were no vitals filed for this visit.   Physical Exam Constitutional:      General: She is not in acute distress.    Appearance: She is ill-appearing.     Comments: Patient walks into clinic.  HENT:     Head: Normocephalic and atraumatic.  Eyes:     General: No scleral icterus. Cardiovascular:      Rate and Rhythm: Normal rate.     Heart sounds: Normal heart sounds.  Pulmonary:     Effort: Pulmonary effort is normal. No respiratory distress.     Breath sounds: No wheezing.     Comments: Decreased breath sounds.  Abdominal:     General: Bowel sounds are normal. There is no distension.     Palpations: Abdomen is soft.  Musculoskeletal:        General: No deformity. Normal range of motion.     Cervical back: Normal range of motion.  Skin:    General: Skin is warm and dry.     Findings: No erythema or rash.  Neurological:     Mental Status: She is alert and oriented to person, place, and time. Mental status is at baseline.  Psychiatric:        Mood and Affect: Mood normal.      LABORATORY DATA:  I have reviewed the data as listed Lab Results  Component Value Date   WBC 20.4 (H) 08/28/2024   HGB 11.2 (L) 08/28/2024   HCT 35.5 (L) 08/28/2024   MCV 96.2 08/28/2024   PLT 715 (H) 08/28/2024   Recent Labs    04/08/24 0605 07/16/24 0744 07/17/24 0838 07/18/24 0322 07/26/24 2015 07/27/24 0330 08/06/24 1300 08/19/24 1757 08/20/24 0119  NA 138   < > 139   < > 135   < > 134* 134* 133*  K 5.1   < > 5.6*   < > 4.8   < > 4.6 3.8 3.7  CL 97*   < > 99   < > 91*   < > 96* 96* 96*  CO2 28   < > 27   < > 26   < > 26 32 33*  GLUCOSE 115*   < > 98   < > 71   < > 62* 137* 83  BUN 42*   < > 67*   < > 20   < > 26* 30* 35*  CREATININE 7.32*   < > 8.95*   < > 5.46*   < > 4.66* 2.28* 2.62*  CALCIUM  8.1*   < > 6.5*   < > 8.1*   < > 8.4* 8.2* 7.6*  GFRNONAA 5*   < > 4*   < > 7*   < > 8* 19* 16*  PROT 7.4  --   --   --  7.5  --   --  5.5*  --   ALBUMIN  3.0*  --  2.7*  --  3.0*  --   --  2.7*  --   AST 26  --   --   --  40  --   --  27  --   ALT 13  --   --   --  18  --   --  12  --   ALKPHOS 70  --   --   --  61  --   --  53  --   BILITOT 1.2  --   --   --  1.1  --   --  0.2  --    < > = values in this interval not displayed.   Iron/TIBC/Ferritin/ %Sat    Component Value Date/Time    IRON 29 08/28/2024 1218   IRON 31 08/22/2023 1227   TIBC 218 (L) 08/28/2024 1218   TIBC 238 (L) 08/22/2023 1227   FERRITIN 262 08/28/2024 1218   FERRITIN 505 (H) 08/22/2023 1227   IRONPCTSAT 13 08/28/2024 1218   IRONPCTSAT 13 (L) 08/22/2023 1227   IRONPCTSAT 18 (L) 01/21/2015 1207      RADIOGRAPHIC STUDIES: I have personally reviewed the radiological images as listed and agreed with the findings in the report. DG Chest 1 View Result Date: 08/19/2024 EXAM: 1 VIEW(S) XRAY OF THE CHEST 08/19/2024 07:04:00 PM COMPARISON: 08/05/2024 CLINICAL HISTORY: elevated wbc FINDINGS: LUNGS AND PLEURA: Left basilar atelectasis or infiltrate. No pleural effusion. No pneumothorax. HEART AND MEDIASTINUM: Aortic atherosclerosis. No acute abnormality of the cardiac and mediastinal silhouettes. BONES AND SOFT TISSUES: No acute osseous abnormality. IMPRESSION: 1. Left basilar atelectasis or infiltrate. 2. Aortic atherosclerosis. Electronically signed by: Franky Crease MD 08/19/2024 07:16 PM EST RP Workstation: [id number]   DG Chest Port 1 View Result Date: 08/05/2024 EXAM: 1 VIEW(S) XRAY OF THE CHEST 08/05/2024 07:04:00 PM COMPARISON: Comparison with previous report dated 08/02/2024. CLINICAL HISTORY: Interstitial edema. FINDINGS: LINES, TUBES AND DEVICES: Loop recorder. LUNGS AND PLEURA: Mild vascular congestion. Bilateral central and basilar interstitial infiltrates, likely edema. Small pleural effusions. No pneumothorax. No significant change since prior study. HEART AND MEDIASTINUM: Cardiac enlargement. Calcification of the aorta. BONES AND SOFT TISSUES: Degenerative changes in the spine and shoulders. No acute osseous abnormality. IMPRESSION: 1. Cardiomegaly with mild vascular congestion and bilateral central and basilar interstitial infiltrates, likely edema. Small pleural effusions. No significant change since the prior study. Electronically signed by: Elsie Gravely MD 08/05/2024 07:09 PM EST RP Workstation:  [id number]   DG Chest Port 1 View Result Date: 08/02/2024 EXAM: 1 VIEW(S) XRAY OF THE CHEST 08/02/2024 05:41:00 PM COMPARISON: 07/30/2024 CLINICAL HISTORY: Pneumonia FINDINGS: LUNGS AND PLEURA: Small left pleural effusion, stable. Small right pleural effusion. Stable interstitial opacities bilateral. Persistent left retrocardiac consolidation is noted. HEART AND MEDIASTINUM: Cardiomegaly, unchanged. Atherosclerotic calcifications. BONES AND SOFT TISSUES: Old right rib fractures. IMPRESSION: 1. Persistent left retrocardiac consolidation. 2. Small left pleural effusion, stable, and small right pleural effusion. 3. Stable interstitial opacities bilaterally. Electronically signed by: Oneil Devonshire MD 08/02/2024 07:23 PM EST RP Workstation: [id number]   US  Venous Img Lower Bilateral (DVT) Result Date: 07/31/2024 EXAM: ULTRASOUND DUPLEX OF THE BILATERAL LOWER EXTREMITY VEINS TECHNIQUE: Duplex ultrasound using B-mode/gray scaled imaging and Doppler spectral analysis and color flow was obtained of the deep venous structures of the bilateral lower extremity. COMPARISON: 05/15/2021 and previous. CLINICAL HISTORY: 801713 Stroke (cerebrum) (HCC) 801713 Stroke (cerebrum) (HCC). FINDINGS: LEFT: The common femoral vein, femoral vein, popliteal vein, and posterior tibial vein of the left lower extremity demonstrate normal compressibility with normal color flow and spectral analysis. RIGHT: The common femoral vein, femoral vein, popliteal vein, and posterior tibial vein of the right lower extremity demonstrate normal compressibility with normal color flow and  spectral analysis. IMPRESSION: 1. No evidence of DVT. Electronically signed by: Dayne Hassell MD 07/31/2024 03:58 PM EST RP Workstation: [id number]   US  CHEST (PLEURAL EFFUSION) Result Date: 07/30/2024 INDICATION: 89 year old female presents with left pleural effusion. Received request for diagnostic and therapeutic thoracentesis. EXAM: CHEST ULTRASOUND COMPARISON:  None  Available. FINDINGS: Trace left pleural effusion. No significant pocket of fluid or percutaneous window to allow safe thoracentesis. Risks outweigh the benefits. IMPRESSION: Trace left pleural effusion with no safe window for percutaneous access. No procedure performed. Read by: Kristi Davenport, NP Electronically Signed   By: Wilkie Lent M.D.   On: 07/30/2024 16:32   MR BRAIN WO CONTRAST Result Date: 07/30/2024 EXAM: MRI BRAIN WITHOUT CONTRAST 07/30/2024 02:37:26 PM TECHNIQUE: Multiplanar multisequence MRI of the head/brain was performed without the administration of intravenous contrast. COMPARISON: None available. CLINICAL HISTORY: concern for stroke FINDINGS: BRAIN AND VENTRICLES: Multiple punctate foci of acute infarction are present within the subcortical white matter of the posterior right temporal lobe, right parietal lobe adjacent to the atrium of the right lateral ventricle, and the high posterior right parietal cortex. A 6 mm focus of acute infarction is present in the left occipital lobe on image 68 of series 5. T2 and FLAIR signal is present in the left occipital infarct and high right posterior parietal infarct. Remote lacunar infarcts are present within the right basal ganglia. Periventricular and subcortical white matter changes are present bilaterally. No intracranial hemorrhage. No mass. No midline shift. No hydrocephalus. The sella is unremarkable. Normal flow voids. The remainder of the exam is somewhat degraded by patient motion. ORBITS: Bilateral lens replacements are noted. The globes and orbits are otherwise within normal limits. SINUSES AND MASTOIDS: No acute abnormality. BONES AND SOFT TISSUES: Normal marrow signal. No acute soft tissue abnormality. IMPRESSION: 1. Multiple punctate foci of acute infarction in the posterior right temporal lobe, right parietal lobe adjacent to the atrium of the right lateral ventricle, and high posterior right parietal cortex. T2 signal changes are  present within the right parietal lesion suggesting at least a subacute time frame of multiple infarcts involving the posterior territory 2. Acute/subacute 6 mm infarct in the left occipital lobe. 3. Remote lacunar infarcts in the right basal ganglia. Electronically signed by: Lonni Necessary MD 07/30/2024 04:00 PM EST RP Workstation: [id number]   DG Chest Port 1 View Result Date: 07/30/2024 EXAM: 1 VIEW(S) XRAY OF THE CHEST 07/30/2024 12:53:00 AM COMPARISON: 07/26/2024. CLINICAL HISTORY: Pneumonia. FINDINGS: LUNGS AND PLEURA: Diffuse peripheral predominant interstitial reticulation is favored to represent sequelae of chronic lung disease possibly early pulmonary fibrosis. Superimposed mild edema not excluded. Progressive airspace consolidation within the left mid and left lower lung concerning for worsening pneumonia. Moderate left pleural effusion. No pneumothorax. HEART AND MEDIASTINUM: Cardiomegaly. Calcified aorta. BONES AND SOFT TISSUES: Old right rib fractures. IMPRESSION: 1. Progressive left mid and lower lung consolidation concerning for worsening pneumonia. 2. Moderate left pleural effusion. Electronically signed by: Waddell Calk MD 07/30/2024 06:29 AM EST RP Workstation: [id number]

## 2024-08-29 LAB — HAPTOGLOBIN: Haptoglobin: 123 mg/dL (ref 41–333)

## 2024-09-01 LAB — COMP PANEL: LEUKEMIA/LYMPHOMA

## 2024-09-09 ENCOUNTER — Ambulatory Visit: Payer: Self-pay | Admitting: Oncology

## 2024-09-10 NOTE — Telephone Encounter (Signed)
-----   Message from Zelphia Cap, MD sent at 09/09/2024 11:07 PM EST ----- Please arrange patient to see palliative care service for discussion of goals of care.

## 2024-09-10 NOTE — Telephone Encounter (Signed)
 Please contact pt to set up appt for palliative care - for goals of care.

## 2024-09-29 ENCOUNTER — Other Ambulatory Visit: Payer: Self-pay

## 2024-09-29 ENCOUNTER — Observation Stay
Admission: EM | Admit: 2024-09-29 | Discharge: 2024-09-30 | Disposition: A | Discharge status: Skilled Nursing Facility | Attending: Obstetrics and Gynecology | Admitting: Obstetrics and Gynecology

## 2024-09-29 ENCOUNTER — Emergency Department

## 2024-09-29 DIAGNOSIS — E1122 Type 2 diabetes mellitus with diabetic chronic kidney disease: Secondary | ICD-10-CM | POA: Insufficient documentation

## 2024-09-29 DIAGNOSIS — D471 Chronic myeloproliferative disease: Secondary | ICD-10-CM | POA: Insufficient documentation

## 2024-09-29 DIAGNOSIS — D72829 Elevated white blood cell count, unspecified: Secondary | ICD-10-CM

## 2024-09-29 DIAGNOSIS — D631 Anemia in chronic kidney disease: Secondary | ICD-10-CM | POA: Insufficient documentation

## 2024-09-29 DIAGNOSIS — I953 Hypotension of hemodialysis: Secondary | ICD-10-CM | POA: Diagnosis not present

## 2024-09-29 DIAGNOSIS — Z8673 Personal history of transient ischemic attack (TIA), and cerebral infarction without residual deficits: Secondary | ICD-10-CM

## 2024-09-29 DIAGNOSIS — I959 Hypotension, unspecified: Principal | ICD-10-CM | POA: Insufficient documentation

## 2024-09-29 DIAGNOSIS — N186 End stage renal disease: Secondary | ICD-10-CM | POA: Insufficient documentation

## 2024-09-29 DIAGNOSIS — I2510 Atherosclerotic heart disease of native coronary artery without angina pectoris: Secondary | ICD-10-CM | POA: Insufficient documentation

## 2024-09-29 DIAGNOSIS — J811 Chronic pulmonary edema: Secondary | ICD-10-CM | POA: Insufficient documentation

## 2024-09-29 DIAGNOSIS — Z79899 Other long term (current) drug therapy: Secondary | ICD-10-CM | POA: Insufficient documentation

## 2024-09-29 DIAGNOSIS — E119 Type 2 diabetes mellitus without complications: Secondary | ICD-10-CM

## 2024-09-29 DIAGNOSIS — R0902 Hypoxemia: Secondary | ICD-10-CM | POA: Insufficient documentation

## 2024-09-29 DIAGNOSIS — I10 Essential (primary) hypertension: Secondary | ICD-10-CM | POA: Diagnosis present

## 2024-09-29 DIAGNOSIS — K922 Gastrointestinal hemorrhage, unspecified: Secondary | ICD-10-CM | POA: Insufficient documentation

## 2024-09-29 DIAGNOSIS — F411 Generalized anxiety disorder: Secondary | ICD-10-CM | POA: Insufficient documentation

## 2024-09-29 DIAGNOSIS — R54 Age-related physical debility: Secondary | ICD-10-CM | POA: Insufficient documentation

## 2024-09-29 DIAGNOSIS — Z87891 Personal history of nicotine dependence: Secondary | ICD-10-CM | POA: Insufficient documentation

## 2024-09-29 DIAGNOSIS — E440 Moderate protein-calorie malnutrition: Secondary | ICD-10-CM

## 2024-09-29 DIAGNOSIS — Z992 Dependence on renal dialysis: Secondary | ICD-10-CM | POA: Insufficient documentation

## 2024-09-29 DIAGNOSIS — I120 Hypertensive chronic kidney disease with stage 5 chronic kidney disease or end stage renal disease: Secondary | ICD-10-CM | POA: Insufficient documentation

## 2024-09-29 DIAGNOSIS — E039 Hypothyroidism, unspecified: Secondary | ICD-10-CM | POA: Insufficient documentation

## 2024-09-29 DIAGNOSIS — G2581 Restless legs syndrome: Secondary | ICD-10-CM | POA: Insufficient documentation

## 2024-09-29 LAB — COMPREHENSIVE METABOLIC PANEL WITH GFR
ALT: <5 U/L (ref 0–44)
AST: 26 U/L (ref 15–41)
Albumin: 2.5 g/dL — ABNORMAL LOW (ref 3.5–5.0)
Alkaline Phosphatase: 76 U/L (ref 38–126)
Anion gap: 10 (ref 5–15)
BUN: 24 mg/dL — ABNORMAL HIGH (ref 8–23)
CO2: 30 mmol/L (ref 22–32)
Calcium: 8.9 mg/dL (ref 8.9–10.3)
Chloride: 89 mmol/L — ABNORMAL LOW (ref 98–111)
Creatinine, Ser: 4.85 mg/dL — ABNORMAL HIGH (ref 0.44–1.00)
GFR, Estimated: 8 mL/min — ABNORMAL LOW
Glucose, Bld: 88 mg/dL (ref 70–99)
Potassium: 3.5 mmol/L (ref 3.5–5.1)
Sodium: 128 mmol/L — ABNORMAL LOW (ref 135–145)
Total Bilirubin: 0.4 mg/dL (ref 0.0–1.2)
Total Protein: 6.6 g/dL (ref 6.5–8.1)

## 2024-09-29 LAB — RESP PANEL BY RT-PCR (RSV, FLU A&B, COVID)  RVPGX2
Influenza A by PCR: NEGATIVE
Influenza B by PCR: NEGATIVE
Resp Syncytial Virus by PCR: NEGATIVE
SARS Coronavirus 2 by RT PCR: NEGATIVE

## 2024-09-29 LAB — CBC WITH DIFFERENTIAL/PLATELET
Abs Immature Granulocytes: 0.3 K/uL — ABNORMAL HIGH (ref 0.00–0.07)
Basophils Absolute: 0.1 K/uL (ref 0.0–0.1)
Basophils Relative: 1 %
Eosinophils Absolute: 0.4 K/uL (ref 0.0–0.5)
Eosinophils Relative: 2 %
HCT: 40.1 % (ref 36.0–46.0)
Hemoglobin: 12.9 g/dL (ref 12.0–15.0)
Immature Granulocytes: 1 %
Lymphocytes Relative: 6 %
Lymphs Abs: 1.4 K/uL (ref 0.7–4.0)
MCH: 29.8 pg (ref 26.0–34.0)
MCHC: 32.2 g/dL (ref 30.0–36.0)
MCV: 92.6 fL (ref 80.0–100.0)
Monocytes Absolute: 1.7 K/uL — ABNORMAL HIGH (ref 0.1–1.0)
Monocytes Relative: 7 %
Neutro Abs: 19.2 K/uL — ABNORMAL HIGH (ref 1.7–7.7)
Neutrophils Relative %: 83 %
Platelets: 1033 K/uL (ref 150–400)
RBC: 4.33 MIL/uL (ref 3.87–5.11)
RDW: 20.3 % — ABNORMAL HIGH (ref 11.5–15.5)
WBC: 23.1 K/uL — ABNORMAL HIGH (ref 4.0–10.5)
nRBC: 0 % (ref 0.0–0.2)

## 2024-09-29 LAB — TYPE AND SCREEN
ABO/RH(D): O POS
Antibody Screen: NEGATIVE

## 2024-09-29 LAB — TSH: TSH: 2.99 u[IU]/mL (ref 0.350–4.500)

## 2024-09-29 LAB — LACTIC ACID, PLASMA
Lactic Acid, Venous: 2.1 mmol/L (ref 0.5–1.9)
Lactic Acid, Venous: 1.7 mmol/L (ref 0.5–1.9)

## 2024-09-29 LAB — PROTIME-INR
INR: 1.1 (ref 0.8–1.2)
Prothrombin Time: 15.1 s (ref 11.4–15.2)

## 2024-09-29 LAB — TROPONIN T, HIGH SENSITIVITY: Troponin T High Sensitivity: 138 ng/L (ref 0–19)

## 2024-09-29 MED ORDER — LEVOTHYROXINE SODIUM 88 MCG PO TABS
88.0000 ug | ORAL_TABLET | Freq: Every day | ORAL | Status: DC
  Administered 2024-09-30: 88 ug via ORAL
  Filled 2024-09-29: qty 1

## 2024-09-29 MED ORDER — SERTRALINE HCL 50 MG PO TABS
50.0000 mg | ORAL_TABLET | Freq: Every day | ORAL | Status: DC
  Administered 2024-09-30: 50 mg via ORAL
  Filled 2024-09-29: qty 1

## 2024-09-29 MED ORDER — SODIUM CHLORIDE 0.9% FLUSH: 3.0000 mL | INTRAVENOUS | Status: DC | PRN

## 2024-09-29 MED ORDER — ALPRAZOLAM 0.25 MG PO TABS: 0.2500 mg | ORAL_TABLET | Freq: Every day | ORAL | Status: DC | PRN

## 2024-09-29 MED ORDER — PANTOPRAZOLE SODIUM 40 MG PO TBEC
40.0000 mg | DELAYED_RELEASE_TABLET | Freq: Every day | ORAL | Status: DC
  Administered 2024-09-30: 40 mg via ORAL
  Filled 2024-09-29: qty 1

## 2024-09-29 MED ORDER — CALCIUM ACETATE (PHOS BINDER) 667 MG PO CAPS
1334.0000 mg | ORAL_CAPSULE | Freq: Three times a day (TID) | ORAL | Status: DC
  Administered 2024-09-29 – 2024-09-30 (×3): 1334 mg via ORAL
  Filled 2024-09-29 (×4): qty 2

## 2024-09-29 MED ORDER — HEPARIN SODIUM (PORCINE) 5000 UNIT/ML IJ SOLN
5000.0000 [IU] | Freq: Three times a day (TID) | INTRAMUSCULAR | Status: DC
  Administered 2024-09-29 – 2024-09-30 (×4): 5000 [IU] via SUBCUTANEOUS
  Filled 2024-09-29 (×4): qty 1

## 2024-09-29 MED ORDER — MENTHOL-ZINC OXIDE 0.44-20.6 % EX OINT: 1.0000 | TOPICAL_OINTMENT | Freq: Three times a day (TID) | CUTANEOUS | Status: DC

## 2024-09-29 MED ORDER — PENTAFLUOROPROP-TETRAFLUOROETH EX AERO: 1.0000 | INHALATION_SPRAY | CUTANEOUS | Status: DC

## 2024-09-29 MED ORDER — HYDROXYUREA 300 MG PO CAPS
300.0000 mg | ORAL_CAPSULE | Freq: Every day | ORAL | Status: DC
  Administered 2024-09-29 – 2024-09-30 (×2): 300 mg via ORAL
  Filled 2024-09-29 (×3): qty 1

## 2024-09-29 MED ORDER — RISAQUAD PO CAPS
1.0000 | ORAL_CAPSULE | Freq: Two times a day (BID) | ORAL | Status: DC
  Administered 2024-09-29 – 2024-09-30 (×2): 1 via ORAL
  Filled 2024-09-29 (×2): qty 1

## 2024-09-29 MED ORDER — GABAPENTIN 100 MG PO CAPS
200.0000 mg | ORAL_CAPSULE | Freq: Every day | ORAL | Status: DC
  Administered 2024-09-29: 200 mg via ORAL
  Filled 2024-09-29: qty 2

## 2024-09-29 MED ORDER — ASPIRIN 81 MG PO CHEW
81.0000 mg | CHEWABLE_TABLET | Freq: Every day | ORAL | Status: DC
  Administered 2024-09-30: 81 mg via ORAL
  Filled 2024-09-29: qty 1

## 2024-09-29 MED ORDER — TIMOLOL MALEATE 0.5 % OP SOLN
1.0000 [drp] | Freq: Every morning | OPHTHALMIC | Status: DC
  Administered 2024-09-30: 1 [drp] via OPHTHALMIC
  Filled 2024-09-29: qty 5

## 2024-09-29 MED ORDER — HYDROCODONE-ACETAMINOPHEN 5-325 MG PO TABS
1.0000 | ORAL_TABLET | Freq: Three times a day (TID) | ORAL | Status: DC | PRN
  Administered 2024-09-29: 1 via ORAL
  Filled 2024-09-29: qty 1

## 2024-09-29 MED ORDER — MELATONIN 5 MG PO TABS
5.0000 mg | ORAL_TABLET | Freq: Every day | ORAL | Status: DC
  Administered 2024-09-29: 5 mg via ORAL
  Filled 2024-09-29: qty 1

## 2024-09-29 MED ORDER — LACTATED RINGERS IV BOLUS
500.0000 mL | Freq: Once | INTRAVENOUS | Status: AC
  Administered 2024-09-29: 500 mL via INTRAVENOUS

## 2024-09-29 MED ORDER — BENZOCAINE 10 % MT GEL: 1.0000 | Freq: Three times a day (TID) | OROMUCOSAL | Status: DC | PRN

## 2024-09-29 MED ORDER — ROSUVASTATIN CALCIUM 10 MG PO TABS
20.0000 mg | ORAL_TABLET | Freq: Every day | ORAL | Status: DC
  Administered 2024-09-30: 20 mg via ORAL
  Filled 2024-09-29: qty 2

## 2024-09-29 MED ORDER — SODIUM CHLORIDE 0.9% FLUSH
3.0000 mL | Freq: Two times a day (BID) | INTRAVENOUS | Status: DC
  Administered 2024-09-29 – 2024-09-30 (×3): 3 mL via INTRAVENOUS

## 2024-09-29 MED ORDER — ROPINIROLE HCL 1 MG PO TABS
0.5000 mg | ORAL_TABLET | Freq: Two times a day (BID) | ORAL | Status: DC
  Administered 2024-09-29 – 2024-09-30 (×2): 0.5 mg via ORAL
  Filled 2024-09-29 (×2): qty 1

## 2024-09-29 MED ORDER — GABAPENTIN 100 MG PO CAPS
100.0000 mg | ORAL_CAPSULE | Freq: Two times a day (BID) | ORAL | Status: DC
  Administered 2024-09-29 – 2024-09-30 (×2): 100 mg via ORAL
  Filled 2024-09-29 (×2): qty 1

## 2024-09-29 MED ORDER — NEPRO/CARBSTEADY PO LIQD
237.0000 mL | Freq: Two times a day (BID) | ORAL | Status: DC
  Administered 2024-09-29 – 2024-09-30 (×2): 237 mL via ORAL

## 2024-09-29 MED ORDER — SODIUM CHLORIDE 0.9 % IV SOLN: 250.0000 mL | INTRAVENOUS | Status: AC | PRN

## 2024-09-29 MED ORDER — AZELASTINE HCL 0.1 % NA SOLN: 2.0000 | Freq: Two times a day (BID) | NASAL | Status: DC | PRN

## 2024-09-29 MED ORDER — CHLORHEXIDINE GLUCONATE CLOTH 2 % EX PADS
6.0000 | MEDICATED_PAD | Freq: Every day | CUTANEOUS | Status: DC
  Administered 2024-09-30: 6 via TOPICAL

## 2024-09-29 MED ORDER — SENNOSIDES-DOCUSATE SODIUM 8.6-50 MG PO TABS
2.0000 | ORAL_TABLET | Freq: Every day | ORAL | Status: DC
  Administered 2024-09-29: 2 via ORAL
  Filled 2024-09-29: qty 2

## 2024-09-29 MED ORDER — ALBUTEROL SULFATE (2.5 MG/3ML) 0.083% IN NEBU: 2.5000 mg | INHALATION_SOLUTION | Freq: Four times a day (QID) | RESPIRATORY_TRACT | Status: DC | PRN

## 2024-09-29 MED ORDER — LATANOPROST 0.005 % OP SOLN
1.0000 [drp] | Freq: Every day | OPHTHALMIC | Status: DC
  Administered 2024-09-29: 1 [drp] via OPHTHALMIC
  Filled 2024-09-29: qty 2.5

## 2024-09-29 MED ORDER — POLYETHYLENE GLYCOL 3350 17 G PO PACK
1.0000 | PACK | Freq: Every day | ORAL | Status: DC
  Administered 2024-09-30: 17 g via ORAL
  Filled 2024-09-29: qty 1

## 2024-09-29 NOTE — ED Provider Notes (Signed)
 "  [facility] Provider Note    Event Date/Time   First MD Initiated Contact with Patient 09/29/24 1044     (approximate)   History   Altered Mental Status   HPI  Cassandra [name] is a 89 y.o. female with a history of symptomatic anemia GI bleeding coronary disease end-stage renal disease type 2 diabetes prolonged QT etc.  Extensive medical history   Reviewed external records from discharge summary from November 26 of last year  Past Medical History:  Diagnosis Date   Anxiety    Aortic atherosclerosis    Arthritis    osteoarthritis in hands, knees ( R torn cartilage, L meniscus removed)    Atherosclerosis of native coronary artery of native heart with unstable angina pectoris (HCC)    CAD (coronary artery disease) 1999   a.) s/p PCI (details unkown) in [place]  in 1999   Campylobacter antigen positive 11/29/2014   a.) presented to [facility] ED with N/V/D; stool culture (+)   ESRD on hemodialysis (HCC)    a.) T-Th-Sat   Glaucoma    Hypercholesterolemia    Hyperlipidemia    Hypertension    Hypothyroidism    Lumbar stenosis with neurogenic claudication    Perforation of colon as colonoscopy complication (HCC) 2010   a.) required short term colostomy with subsequent take down/reversal   RLS (restless legs syndrome)    Thrombocytosis    Type 2 diabetes mellitus with chronic kidney disease (HCC)      Physical Exam   Triage Vital Signs: ED Triage Vitals  Encounter Vitals Group     BP 09/29/24 1038 (!) 79/47     Girls Systolic BP Percentile --      Girls Diastolic BP Percentile --      Boys Systolic BP Percentile --      Boys Diastolic BP Percentile --      Pulse Rate 09/29/24 1038 68     Resp 09/29/24 1038 18     Temp 09/29/24 1038 97.8 F (36.6 C)     Temp Source 09/29/24 1038 Oral     SpO2 09/29/24 1038 97 %     Weight 09/29/24 1041 118 lb 6.2 oz (53.7 kg)     Height 09/29/24 1041 5' 1 (1.549 m)     Head Circumference --      Peak  Flow --      Pain Score 09/29/24 1041 0     Pain Loc --      Pain Education --      Exclude from Growth Chart --     Most recent vital signs: Vitals:   09/29/24 1410 09/29/24 1412  BP: (!) 106/42 (!) 113/46  Pulse: (!) 57 (!) 53  Resp: (!) 22 19  Temp:    SpO2: 100% 100%     General: Awake, no distress.  She is pleasant, in no distress appears chronically ill but in no acute extremis CV:   Good peripheral perfusion. Normal rate and heart tones. Resp:   Normal effort. Lung sounds clear bilateral.  She denies any shortness of breath speaking without distress. Abd:   No distention. Soft, non-tender to palpation in all quadrants. No rebound or guarding. Neuro:   No focal neuro deficits noted. Moves extremities well without noted concern. Other:  Is generally fatigued but in no distress.  She is pleasant.   ED Results / Procedures / Treatments   Labs (all labs ordered are listed, but only abnormal results are  displayed) Labs Reviewed  LACTIC ACID, PLASMA - Abnormal; Notable for the following components:      Result Value   Lactic Acid, Venous 2.1 (*)    All other components within normal limits  COMPREHENSIVE METABOLIC PANEL WITH GFR - Abnormal; Notable for the following components:   Sodium 128 (*)    Chloride 89 (*)    BUN 24 (*)    Creatinine, Ser 4.85 (*)    Albumin  2.5 (*)    GFR, Estimated 8 (*)    All other components within normal limits  CBC WITH DIFFERENTIAL/PLATELET - Abnormal; Notable for the following components:   WBC 23.1 (*)    RDW 20.3 (*)    Platelets 1,033 (*)    Neutro Abs 19.2 (*)    Monocytes Absolute 1.7 (*)    Abs Immature Granulocytes 0.30 (*)    All other components within normal limits  TROPONIN T, HIGH SENSITIVITY - Abnormal; Notable for the following components:   Troponin T High Sensitivity 138 (*)    All other components within normal limits  RESP PANEL BY RT-PCR (RSV, FLU A&B, COVID)  RVPGX2  CULTURE, BLOOD (SINGLE)  CULTURE, BLOOD  (ROUTINE X 2)  CULTURE, BLOOD (ROUTINE X 2)  PROTIME-INR  LACTIC ACID, PLASMA  CBC WITH DIFFERENTIAL/PLATELET  URINALYSIS, W/ REFLEX TO CULTURE (INFECTION SUSPECTED)  TYPE AND SCREEN     EKG  I independently reviewed the EKG at 1215 heart rate 60 QRS 90 QTc 440 normal sinus rhythm, slight artifact.  No frank evidence of ischemia   RADIOLOGY I independently reviewed images of chest x-ray, diffuse somewhat interstitial like pattern that I suspect likely represents pulmonary edema difficult to exclude underlying pneumonia   DG Chest Port 1 View Result Date: 09/29/2024 CLINICAL DATA:  Altered mental status.  History of dialysis. EXAM: PORTABLE CHEST 1 VIEW COMPARISON:  08/19/2024. FINDINGS: The heart size and mediastinal contours are within unchanged. Aortic atherosclerosis. Persistent left basilar opacity with volume loss, which may reflect left basilar atelectasis or infiltrate with small left pleural effusion. New small right pleural effusion with right basilar opacities, which could reflect atelectasis or infiltrate. Diffuse bilateral interstitial opacities, most compatible with interstitial pulmonary edema. No pneumothorax. Diffuse osseous demineralization. No acute osseous abnormality. IMPRESSION: 1. Diffuse bilateral interstitial opacities, most compatible with interstitial pulmonary edema. 2. New small right pleural effusion with right basilar opacities, which could reflect atelectasis or infiltrate. 3. Persistent left basilar opacity with volume loss, which may reflect left basilar atelectasis or infiltrate with small left pleural effusion. Electronically Signed   By: Harrietta Sherry M.D.   On: 09/29/2024 12:10       PROCEDURES:  Critical Care performed: Yes, see critical care procedure note(s)  Procedures   MEDICATIONS ORDERED IN ED: Medications  lactated ringers  bolus 500 mL (0 mLs Intravenous Stopped 09/29/24 1232)     IMPRESSION / MDM / ASSESSMENT AND PLAN / ED COURSE   I reviewed the triage vital signs and the nursing notes.                              Based on presentation, the differential diagnosis includes, but is not limited to key considerations: altered mental DIFFERENTIAL DIAGNOSIS CONSIDERATIONS:  Dehydration, electrolyte abnormality, elevated BUN worsening creatinine, pulmonary edema, infection UTI pneumonia, etc.  Patient is responsive to fluids blood pressure normalizing of consulted with nephrology and hospitalist.  She is showing improvement at this time.  I  suspect pulmonary edema and with her age and end-stage renal disease, this could be a difficult balance.  I do not have obvious evidence of frank infection but advises remains on the differential at the time of admission but hospitalist is aware and will continue to follow this and evaluate for this at time of admission consult as well.  No evidence of acute cardiac dysrhythmia.  Overall reassuring improvement after IV fluids  Workup focused on identifying reversible metabolic, infectious, or structural causes.    Patient's presentation is most consistent with acute complicated illness / injury requiring diagnostic workup.    The patient is on the cardiac monitor to evaluate for evidence of arrhythmia and/or significant heart rate changes.  Clinical Course as of 09/29/24 1421  Mon Sep 29, 2024  1415 Notified Dr. Marcelino of consult request via [facility] [MQ]  1415 Patient be admitted to the hospital service under care of Dr. Kandis.  We did discuss hard to differentiate of potential infection or pneumonia but given her history chest x-ray end-stage renal disease lack of fever may or may not represent pneumonia and could more likely represent pulmonary edema type picture.  Nephrology been consulted.  Her blood pressure improving she is resting comfortably after fluid bolus.  Further workup in the hospitalist service, at this point I do not have strong inclination to start antibiotic especially with  her not endorsing any respiratory symptoms or cough.  No fevers or bodyaches just fatigue. [MQ]  1416 Hospitalist to continue to follow closely and advise they will make decision as to whether or not to start antibiotics [MQ]  1417 Patient does have leukocytosis with left shift.  She does report she occasionally urinates a small amount.  Pending urinalysis as well at this time [MQ]    Clinical Course User Index [MQ] Dicky Anes, MD     FINAL CLINICAL IMPRESSION(S) / ED DIAGNOSES   Final diagnoses:  Hypotension, unspecified hypotension type  ESRD (end stage renal disease) on dialysis (HCC)  Leukocytosis, unspecified type     Rx / DC Orders   ED Discharge Orders     None        Note:  This document was prepared using Dragon voice recognition software and may include unintentional dictation errors.   Dicky Anes, MD 09/29/24 1421  "

## 2024-09-29 NOTE — H&P (Signed)
 " History and Physical    Cassandra [name] FMW:[id number] DOB: 09/23/1927 DOA: 09/29/2024  PCP: Cleotilde Oneil FALCON, MD  Patient coming from: snf   Chief Complaint: hypotension  HPI: Cassandra [name] is a 89 y.o. female with medical history significant for myeloproliferative disorder, hypothyroid, anemia of kidney disease, dm, esrd on hemodialysis, htn, gi bleed, cva, who presents with the above. Reportedly more lethargic and less responsive than normal this morning, was hypotensive when bp was checked. Denies cough or shortness of breath. Denies fever. Denies pain. Denies vomiting or diarrhea. Denies new weakness or numbness. Currently feels back to baseline.    Review of Systems: As per HPI otherwise 10 point review of systems negative.    Past Medical History:  Diagnosis Date   Anxiety    Aortic atherosclerosis    Arthritis    osteoarthritis in hands, knees ( R torn cartilage, L meniscus removed)    Atherosclerosis of native coronary artery of native heart with unstable angina pectoris (HCC)    CAD (coronary artery disease) 1999   a.) s/p PCI (details unkown) in [place]  in 1999   Campylobacter antigen positive 11/29/2014   a.) presented to [facility] ED with N/V/D; stool culture (+)   ESRD on hemodialysis (HCC)    a.) T-Th-Sat   Glaucoma    Hypercholesterolemia    Hyperlipidemia    Hypertension    Hypothyroidism    Lumbar stenosis with neurogenic claudication    Perforation of colon as colonoscopy complication (HCC) 2010   a.) required short term colostomy with subsequent take down/reversal   RLS (restless legs syndrome)    Thrombocytosis    Type 2 diabetes mellitus with chronic kidney disease [facility])     Past Surgical History:  Procedure Laterality Date   A/V FISTULAGRAM Left 12/25/2023   Procedure: A/V Fistulagram;  Surgeon: Jama Cordella MATSU, MD;  Location: [facility] INVASIVE CV LAB;  Service: Cardiovascular;  Laterality: Left;   ABDOMINAL HYSTERECTOMY     left ovary removed    AV FISTULA PLACEMENT Left 05/25/2023   Procedure: ARTERIOVENOUS (AV) FISTULA CREATION (BRACHIALCEPHALIC);  Surgeon: Jama Cordella MATSU, MD;  Location: [facility] ORS;  Service: Vascular;  Laterality: Left;   BLEPHAROPLASTY Bilateral    CATARACT EXTRACTION W/ INTRAOCULAR LENS  IMPLANT, BILATERAL     COLONOSCOPY  2010   COLOSTOMY  03/2009   perforated colon from colonoscopy   COLOSTOMY REVERSAL  08/2009   CORONARY ANGIOPLASTY WITH STENT PLACEMENT  1999   DIALYSIS/PERMA CATHETER INSERTION N/A 03/08/2023   Procedure: DIALYSIS/PERMA CATHETER INSERTION;  Surgeon: Marea Selinda RAMAN, MD;  Location: [facility] INVASIVE CV LAB;  Service: Cardiovascular;  Laterality: N/A;   DIALYSIS/PERMA CATHETER REMOVAL N/A 09/05/2023   Procedure: DIALYSIS/PERMA CATHETER REMOVAL;  Surgeon: Marea Selinda RAMAN, MD;  Location: [facility] INVASIVE CV LAB;  Service: Cardiovascular;  Laterality: N/A;   FOOT SURGERY Bilateral    toes correction   KNEE ARTHROSCOPY Right    torn cartilage   KNEE ARTHROSCOPY W/ MENISCECTOMY Left 01/2012   TEMPORARY DIALYSIS CATHETER N/A 03/05/2023   Procedure: TEMPORARY DIALYSIS CATHETER;  Surgeon: Marea Selinda RAMAN, MD;  Location: [facility] INVASIVE CV LAB;  Service: Cardiovascular;  Laterality: N/A;     reports that she quit smoking about 44 years ago. Her smoking use included cigarettes. She has never used smokeless tobacco. She reports current alcohol use. She reports that she does not use drugs.  Allergies[1]  Family History  Problem Relation Age of Onset   Hypertension Mother  Hypertension Father    Early death Father    Heart disease Father     Prior to Admission medications  Medication Sig Start Date End Date Taking? Authorizing Provider  albuterol  (VENTOLIN  HFA) 108 (90 Base) MCG/ACT inhaler Inhale 2 puffs into the lungs every 6 (six) hours as needed for shortness of breath. 03/22/23  Yes [provider]  ALPRAZolam  (XANAX ) 0.25 MG tablet Take 1 tablet (0.25 mg total) by mouth daily as needed.  08/04/24  Yes Awanda City, MD  amLODipine  (NORVASC ) 5 MG tablet Take 1 tablet (5 mg total) by mouth daily. 08/06/24  Yes Franchot Novel, MD  amoxicillin-clavulanate (AUGMENTIN) 875-125 MG tablet Take 1 tablet by mouth 2 (two) times daily.   Yes [provider]  aspirin  81 MG chewable tablet Chew 1 tablet by mouth daily. 03/23/23  Yes [provider]  azelastine  (ASTELIN ) 0.1 % nasal spray Place 2 sprays into the nose 2 (two) times daily. 03/23/23  Yes [provider]  benzocaine  (ORAJEL) 10 % mucosal gel Use as directed 1 Application in the mouth or throat 3 (three) times daily as needed for mouth pain.   Yes [provider]  betamethasone valerate (VALISONE) 0.1 % cream Apply 1 Application topically daily.   Yes [provider]  calcium  acetate (PHOSLO ) 667 MG capsule Take 1,334 mg by mouth 3 (three) times daily. 06/30/23  Yes [provider]  cetirizine (ZYRTEC) 10 MG tablet Take 1 tablet by mouth daily as needed for allergies.   Yes [provider]  [Paused] cloNIDine  (CATAPRES ) 0.1 MG tablet Take 0.1 mg by mouth 2 (two) times daily. Wait to take this until your doctor or other care provider tells you to start again. 06/25/24  Yes [provider]  CRYODOSE TA AERO Apply 1 Application topically as directed. SPRAY 1 AS DIRECTED FOR USE DURING DIALYSIS TO ACCESS 07/11/24  Yes [provider]  [employer] D3 25 MCG (1000 UT) capsule Take 1,000 Units by mouth daily. 02/21/23  Yes [provider]  doxycycline  (ADOXA) 100 MG tablet Take 100 mg by mouth 2 (two) times daily.   Yes [provider]  gabapentin  (NEURONTIN ) 100 MG capsule Take 1-2 capsules (100-200 mg total) by mouth 3 (three) times daily. 100 mg am 100 mg afternoon 200 mg bedtime. Home med. 04/10/24  Yes Awanda City, MD  HYDROcodone -acetaminophen  (NORCO/VICODIN) 5-325 MG tablet Take 1 tablet by mouth 3 (three) times daily as needed for moderate pain (pain  score 4-6) or severe pain (pain score 7-10). 08/04/24  Yes Awanda City, MD  Hydroxyurea  (SIKLOS ) 100 MG TABS Take 3 tablets by mouth daily with supper.   Yes [provider]  lactobacillus acidophilus (BACID) TABS tablet Take 2 tablets by mouth 2 (two) times daily.   Yes [provider]  latanoprost  (XALATAN ) 0.005 % ophthalmic solution Place 1 drop into both eyes at bedtime. 09/13/15  Yes [provider]  levothyroxine  (SYNTHROID ) 88 MCG tablet Take 88 mcg by mouth daily. Take on an empty stomach 30 to 60 minutes before breakfast 12/08/14  Yes [provider]  lidocaine -prilocaine  (EMLA ) cream Apply 1 Application topically as needed. 03/24/24  Yes Camara, Pastor, MD  melatonin 3 MG TABS tablet Take 6 mg by mouth at bedtime.   Yes [provider]  Menthol -Zinc  Oxide (CALMOSEPTINE) 0.44-20.6 % OINT Apply 1 Application topically 3 (three) times daily.   Yes [provider]  naloxone (NARCAN) nasal spray 4 mg/0.1 mL Place 4 mg into the  nose once. 06/23/24  Yes [provider]  pantoprazole  (PROTONIX ) 40 MG tablet Take 1 tablet by mouth daily. 03/23/23  Yes [provider]  polyethylene glycol (HEALTHYLAX) 17 g packet Take 1 packet by mouth daily. 03/23/23  Yes [provider]  rOPINIRole  (REQUIP ) 0.5 MG tablet Take 1 tablet (0.5 mg total) by mouth in the morning and at bedtime. 08/22/23 09/29/24 Yes Camara, Amadou, MD  rosuvastatin  (CRESTOR ) 10 MG tablet Take 1 tablet (10 mg total) by mouth daily. Reduced from 20 mg to 10 mg, due to ESRD. 07/18/24  Yes Awanda City, MD  senna-docusate (SENOKOT-S) 8.6-50 MG tablet Take 2 tablets by mouth at bedtime.   Yes [provider]  sertraline  (ZOLOFT ) 50 MG tablet Take 50 mg by mouth daily.   Yes [provider]  timolol  (TIMOPTIC -XR) 0.5 % ophthalmic gel-forming Place 1 drop into both eyes every morning. 05/06/21  Yes [provider]  white petrolatum ointment Apply 1  application  topically 3 (three) times daily as needed for dry skin.   Yes [provider]  glucose blood test strip Use once daily. Use as instructed. 02/21/17   [provider]  Nutritional Supplements (FEEDING SUPPLEMENT, NEPRO CARB STEADY,) LIQD Take 237 mLs by mouth 2 (two) times daily between meals. 03/12/23   Marsa Edelman, DO    Physical Exam: Vitals:   09/29/24 1410 09/29/24 1412 09/29/24 1438 09/29/24 1450  BP: (!) 106/42 (!) 113/46 (!) 90/51 (!) 131/55  Pulse: (!) 57 (!) 53 (!) 58 (!) 58  Resp: (!) 22 19  (!) 22  Temp:      TempSrc:      SpO2: 100% 100% 100% 93%  Weight:      Height:        Constitutional: No acute distress. Chronically ill Head: Atraumatic Eyes: Conjunctiva clear ENM: Moist mucous membranes. Has dentures Neck: Supple Respiratory: scattered rales Cardiovascular: Regular rate and rhythm. Soft systolic murmur. Abdomen: Non-tender, non-distended. No masses. No rebound or guarding. Positive bowel sounds. Musculoskeletal: No joint deformity upper and lower extremities.   Skin: No rashes, lesions, or ulcers.  Extremities: No peripheral edema. Palpable peripheral pulses. No edema Neurologic: Alert, moving all 4 extremities. Psychiatric: calm   Labs on Admission: I have personally reviewed following labs and imaging studies  CBC: Recent Labs  Lab 09/29/24 1107  WBC 23.1*  NEUTROABS 19.2*  HGB 12.9  HCT 40.1  MCV 92.6  PLT 1,033*   Basic Metabolic Panel: Recent Labs  Lab 09/29/24 1107  NA 128*  K 3.5  CL 89*  CO2 30  GLUCOSE 88  BUN 24*  CREATININE 4.85*  CALCIUM  8.9   GFR: Estimated Creatinine Clearance: 5.2 mL/min (A) (by C-G formula based on SCr of 4.85 mg/dL (H)). Liver Function Tests: Recent Labs  Lab 09/29/24 1107  AST 26  ALT <5  ALKPHOS 76  BILITOT 0.4  PROT 6.6  ALBUMIN  2.5*   No results for input(s): LIPASE, AMYLASE in the last 168 hours. No results for input(s): AMMONIA in the last 168  hours. Coagulation Profile: Recent Labs  Lab 09/29/24 1107  INR 1.1   Cardiac Enzymes: No results for input(s): CKTOTAL, CKMB, CKMBINDEX, TROPONINI in the last 168 hours. BNP (last 3 results) No results for input(s): PROBNP in the last 8760 hours. HbA1C: No results for input(s): HGBA1C in the last 72 hours. CBG: No results for input(s): GLUCAP in the last 168 hours. Lipid Profile: No results for input(s): CHOL, HDL, LDLCALC, TRIG,  CHOLHDL, LDLDIRECT in the last 72 hours. Thyroid Function Tests: No results for input(s): TSH, T4TOTAL, FREET4, T3FREE, THYROIDAB in the last 72 hours. Anemia Panel: No results for input(s): VITAMINB12, FOLATE, FERRITIN, TIBC, IRON, RETICCTPCT in the last 72 hours. Urine analysis:    Component Value Date/Time   COLORURINE STRAW (A) 12/12/2021 1702   APPEARANCEUR CLEAR (A) 12/12/2021 1702   LABSPEC 1.005 12/12/2021 1702   PHURINE 7.0 12/12/2021 1702   GLUCOSEU 50 (A) 12/12/2021 1702   HGBUR MODERATE (A) 12/12/2021 1702   BILIRUBINUR NEGATIVE 12/12/2021 1702   KETONESUR NEGATIVE 12/12/2021 1702   PROTEINUR 100 (A) 12/12/2021 1702   NITRITE NEGATIVE 12/12/2021 1702   LEUKOCYTESUR SMALL (A) 12/12/2021 1702    Radiological Exams on Admission: DG Chest Port 1 View Result Date: 09/29/2024 CLINICAL DATA:  Altered mental status.  History of dialysis. EXAM: PORTABLE CHEST 1 VIEW COMPARISON:  08/19/2024. FINDINGS: The heart size and mediastinal contours are within unchanged. Aortic atherosclerosis. Persistent left basilar opacity with volume loss, which may reflect left basilar atelectasis or infiltrate with small left pleural effusion. New small right pleural effusion with right basilar opacities, which could reflect atelectasis or infiltrate. Diffuse bilateral interstitial opacities, most compatible with interstitial pulmonary edema. No pneumothorax. Diffuse osseous demineralization. No acute osseous abnormality.  IMPRESSION: 1. Diffuse bilateral interstitial opacities, most compatible with interstitial pulmonary edema. 2. New small right pleural effusion with right basilar opacities, which could reflect atelectasis or infiltrate. 3. Persistent left basilar opacity with volume loss, which may reflect left basilar atelectasis or infiltrate with small left pleural effusion. Electronically Signed   By: Harrietta Sherry M.D.   On: 09/29/2024 12:10    EKG: Independently reviewed. nsr  Assessment/Plan Principal Problem:   Hypotension Active Problems:   Anemia in ESRD (end-stage renal disease) (HCC)   CAD (coronary artery disease), native coronary artery   ESRD (end stage renal disease) on dialysis [facility])   Recent cerebrovascular accident 10/12/2021 (CVA)   Diabetes mellitus, type II (HCC)   Acquired hypothyroidism   Hypoxemia   GIB (gastrointestinal bleeding)   ESRD on dialysis [facility])   Essential hypertension   # Hypotension Etiology unclear. Resolved with small bolus. No focal symptoms - hold home amlodipine  - f/u blood cultures - monitor  # Pulmonary edema On CXR, less likely pneumonia as no fever or cough. Placed on 2 liters in the ER but no documented hypoxia - volume w/ dialysis - monitor off oxygen  # ESRD On tts hemodialysis - nephrology consulted  # Debility Comes from rehab - pt consult  # Myeloproliferative disorder Seen recently by oncology, patient declined bmb. Platelets and wbcs continue to up-trend - continue home hydroxyurea   # GAD - home xanax   # Chronic pain - home hydrocodone   # Recent CVA Plavix  stopped after concern for GIB - cont home rosuvastatin , aspirin   # hypothyroid - f/u tsh - home levothyroxine   # restless leg - home requip , gabapentin   DVT prophylaxis: heparin  Code Status: dnr/dni, confirmed with dnr at bedside  Family Communication: husband at bedside 1/5  Consults called: nephrology   Level of care: Med-Surg Status is: Observation The  patient remains OBS appropriate and will d/c before 2 midnights.    Devaughn KATHEE Ban MD [facility] Pager [phone]  If 7PM-7AM, please contact night-coverage [email or web address] Password [email or web address]  09/29/2024, 3:00 PM       [1]  Allergies Allergen Reactions   Atorvastatin Other (See Comments)    MYALGIA   Cyclobenzaprine Other (See Comments)  hallucination   Mirtazapine     Other reaction(s): Hallucination   Oxycodone-Acetaminophen  Other (See Comments)    hallucination   Paroxetine Hcl Other (See Comments)    hallucination   Penicillins Swelling    Lip and orbital swelling Tolerated 3rd generation cephalosporin (CEFTRIAXONE ) between 03/05/2023 and 03/09/2023 with no documented ADRs.   Propoxyphene Other (See Comments)    hallucination   Trazodone  Other (See Comments)    hallucination   "

## 2024-09-29 NOTE — ED Triage Notes (Signed)
 Pt presents to the ED via [facility] from [facility]. Per [facility] pt woke up this morning with AMS. Pt is on 2L via [place] at baseline. Pt is a dialysis pt. Unknown last dialysis treatment.   BP 80/42 -- 112/49 A&Ox4 with EMS CBG 121

## 2024-09-29 NOTE — Evaluation (Signed)
 Physical Therapy Evaluation Patient Details Name: Cassandra [name] MRN: [id number] DOB: Feb 04, 1927 Today's Date: 09/29/2024  History of Present Illness  Pt is a 89 y.o. female with medical history significant for myeloproliferative disorder, hypothyroid, anemia of kidney disease, DM, ESRD on HD, HTN, GI bleed, and CVA who presents with hypotension.  MD assessment also includes pulmonary edema and debility.   Clinical Impression  Pt was very HOH but pleasant and motivated to participate during the session and put forth good effort throughout. Pt required heavy assist with bed mobility tasks and presented with poor static sitting balance initially but progressed to fair with practice, cuing, and anterior weight shifting activities.  Pt was able to come to standing with assist and cuing but once in standing was unable to advance either LE despite multiple attempts, cuing for proper sequencing, and physical assistance for stability.  Pt's supine BP taken at 132/60 (HR 57) and sitting 146/71 (63) with no adverse symptoms.  Pt is at a very high risk for falls and will benefit from continued PT services upon discharge to safely address deficits listed in patient problem list for decreased caregiver assistance and eventual return to PLOF.          If plan is discharge home, recommend the following: Two people to help with walking and/or transfers;A lot of help with bathing/dressing/bathroom;Assistance with cooking/housework;Direct supervision/assist for medications management;Help with stairs or ramp for entrance;Assist for transportation   Can travel by private vehicle   No    Equipment Recommendations Other (comment) (TBD at next venue of care)  Recommendations for Other Services       Functional Status Assessment Patient has had a recent decline in their functional status and/or demonstrates limited ability to make significant improvements in function in a reasonable and predictable amount of  time     Precautions / Restrictions Precautions Precautions: Fall Restrictions Weight Bearing Restrictions Per Provider Order: No      Mobility  Bed Mobility Overal bed mobility: Needs Assistance Bed Mobility: Supine to Sit, Sit to Supine     Supine to sit: Max assist Sit to supine: Max assist   General bed mobility comments: Max A for BLE and trunk control with cues for sequencing    Transfers Overall transfer level: Needs assistance Equipment used: Rolling walker (2 wheels) Transfers: Sit to/from Stand Sit to Stand: From elevated surface, Min assist, Mod assist           General transfer comment: Multi-modal cues for foot placement and increased trunk flex with min to mod A to come to standing and min A for stability while in standing    Ambulation/Gait               General Gait Details: Multiple attempts made for pt to advance either LE with pt unable to do so  Stairs            Wheelchair Mobility     Tilt Bed    Modified Rankin (Stroke Patients Only)       Balance Overall balance assessment: Needs assistance Sitting-balance support: Bilateral upper extremity supported, Feet supported Sitting balance-Leahy Scale: Poor Sitting balance - Comments: Fair to poor static sitting balance with occasional min A to prevent posterior LOB followed by periods where pt was able to maintain static sitting without assist   Standing balance support: Bilateral upper extremity supported, Reliant on assistive device for balance Standing balance-Leahy Scale: Poor  Pertinent Vitals/Pain Pain Assessment Pain Assessment: No/denies pain    Home Living Family/patient expects to be discharged to:: Private residence Living Arrangements: Spouse/significant other Available Help at Discharge: Available 24 hours/day;Family Type of Home: Apartment Home Access: Level entry       Home Layout: One level Home Equipment:  Rollator (4 wheels);Tub bench;Rolling Walker (2 wheels);BSC/3in1      Prior Function               Mobility Comments: Limited community ambulator using a rollator at baseline but per patient has not ambulated since being admitted to [facility] ADLs Comments: Assist from spouse with ADLs     Extremity/Trunk Assessment   Upper Extremity Assessment Upper Extremity Assessment: Generalized weakness    Lower Extremity Assessment Lower Extremity Assessment: Generalized weakness       Communication   Communication Communication: Impaired Factors Affecting Communication: Hearing impaired    Cognition Arousal: Alert Behavior During Therapy: WFL for tasks assessed/performed   PT - Cognitive impairments: Difficult to assess Difficult to assess due to: Hard of hearing/deaf                       Following commands: Impaired Following commands impaired: Follows one step commands with increased time     Cueing Cueing Techniques: Verbal cues, Tactile cues, Visual cues     General Comments      Exercises Other Exercises Other Exercises: Static sitting balance training at EOB with anterior weight shifting activities to prevent posterior LOB   Assessment/Plan    PT Assessment Patient needs continued PT services  PT Problem List Decreased strength;Decreased activity tolerance;Decreased balance;Decreased mobility;Decreased knowledge of use of DME       PT Treatment Interventions DME instruction;Gait training;Functional mobility training;Therapeutic activities;Therapeutic exercise;Balance training;Patient/family education    PT Goals (Current goals can be found in the Care Plan section)  Acute Rehab PT Goals Patient Stated Goal: To return to SNF to work on getting stronger PT Goal Formulation: With patient Time For Goal Achievement: 10/12/24 Potential to Achieve Goals: Fair    Frequency Min 1X/week     Co-evaluation               AM-PAC PT 6 Clicks  Mobility  Outcome Measure Help needed turning from your back to your side while in a flat bed without using bedrails?: Total Help needed moving from lying on your back to sitting on the side of a flat bed without using bedrails?: Total Help needed moving to and from a bed to a chair (including a wheelchair)?: Total Help needed standing up from a chair using your arms (e.g., wheelchair or bedside chair)?: A Lot Help needed to walk in hospital room?: Total Help needed climbing 3-5 steps with a railing? : Total 6 Click Score: 7    End of Session Equipment Utilized During Treatment: Gait belt;Oxygen Activity Tolerance: Patient tolerated treatment well Patient left: in bed;with call bell/phone within reach;with nursing/sitter in room (Pt left with two CNA's for hygiene assist with patient) Nurse Communication: Mobility status PT Visit Diagnosis: Unsteadiness on feet (R26.81);Difficulty in walking, not elsewhere classified (R26.2);Muscle weakness (generalized) (M62.81)    Time: 8383-8363 PT Time Calculation (min) (ACUTE ONLY): 20 min   Charges:   PT Evaluation $PT Eval Moderate Complexity: 1 Mod PT Treatments $Therapeutic Activity: 8-22 mins PT General Charges $$ ACUTE PT VISIT: 1 Visit       D. Scott Marybell Robards PT, DPT 09/29/2024, 5:00 PM

## 2024-09-29 NOTE — ED Notes (Signed)
 Called CCMD for central monitoring at this time

## 2024-09-29 NOTE — ED Notes (Signed)
Two sets of cultures sent to lab.

## 2024-09-29 NOTE — ED Notes (Signed)
 Pt changed and repositioned to left side at this time due to pressure ulcer in sacral area. New mepilex applied.

## 2024-09-30 DIAGNOSIS — I953 Hypotension of hemodialysis: Secondary | ICD-10-CM | POA: Diagnosis not present

## 2024-09-30 LAB — BASIC METABOLIC PANEL WITH GFR
Anion gap: 11 (ref 5–15)
BUN: 26 mg/dL — ABNORMAL HIGH (ref 8–23)
CO2: 26 mmol/L (ref 22–32)
Calcium: 8 mg/dL — ABNORMAL LOW (ref 8.9–10.3)
Chloride: 90 mmol/L — ABNORMAL LOW (ref 98–111)
Creatinine, Ser: 5.36 mg/dL — ABNORMAL HIGH (ref 0.44–1.00)
GFR, Estimated: 7 mL/min — ABNORMAL LOW
Glucose, Bld: 74 mg/dL (ref 70–99)
Potassium: 4.2 mmol/L (ref 3.5–5.1)
Sodium: 127 mmol/L — ABNORMAL LOW (ref 135–145)

## 2024-09-30 LAB — CBC
HCT: 35.7 % — ABNORMAL LOW (ref 36.0–46.0)
Hemoglobin: 11.7 g/dL — ABNORMAL LOW (ref 12.0–15.0)
MCH: 29.7 pg (ref 26.0–34.0)
MCHC: 32.8 g/dL (ref 30.0–36.0)
MCV: 90.6 fL (ref 80.0–100.0)
Platelets: 963 K/uL (ref 150–400)
RBC: 3.94 MIL/uL (ref 3.87–5.11)
RDW: 19.9 % — ABNORMAL HIGH (ref 11.5–15.5)
WBC: 25.9 K/uL — ABNORMAL HIGH (ref 4.0–10.5)
nRBC: 0 % (ref 0.0–0.2)

## 2024-09-30 MED ORDER — ALPRAZOLAM 0.25 MG PO TABS: 0.2500 mg | ORAL_TABLET | Freq: Every day | ORAL | 0 refills | Status: AC | PRN

## 2024-09-30 MED ORDER — COLLAGENASE 250 UNIT/GM EX OINT
TOPICAL_OINTMENT | Freq: Every day | CUTANEOUS | Status: DC
  Filled 2024-09-30: qty 30

## 2024-09-30 MED ORDER — HYDROCODONE-ACETAMINOPHEN 5-325 MG PO TABS: 1.0000 | ORAL_TABLET | Freq: Three times a day (TID) | ORAL | 0 refills | Status: AC | PRN

## 2024-09-30 MED ORDER — PENTAFLUOROPROP-TETRAFLUOROETH EX AERO
INHALATION_SPRAY | CUTANEOUS | Status: AC
  Filled 2024-09-30: qty 30

## 2024-09-30 NOTE — Progress Notes (Signed)
 Hemodialysis Note:  Received patient in bed to unit. Alert and oriented. Informed consent singed and in chart.  Treatment initiated: 0808 Treatment completed: `1110  Access used: Left AVF Access issues: None  Patient tolerated well. Transported back to room, alert without acute distress. Report given to patient's RN.  Total UF removed: 1.5 Liters Medications given: None  Post HD weight: 46 Kg  [facility]

## 2024-09-30 NOTE — Plan of Care (Signed)

## 2024-09-30 NOTE — Care Management Obs Status (Signed)
 MEDICARE OBSERVATION STATUS NOTIFICATION   Patient Details  Name: INFANT DOANE MRN: [id number] Date of Birth: Jul 02, 1927   Medicare Observation Status Notification Given:  Yes  Patient states she's in to much pain to sign.  Rojelio [name] 09/30/2024, 12:01 PM

## 2024-09-30 NOTE — Progress Notes (Signed)
 " [facility]  ROUNDING NOTE   Subjective:   Cassandra [name] is a 89 year old female with past medical conditions including anxiety, hypertension, RLS, neuropathy, and end-stage renal disease on hemodialysis. Patient presents to the emergency department with altered mental status and has been admitted for ESRD (end stage renal disease) on dialysis (HCC) [N18.6, Z99.2] Hypotension [I95.9] Hypotension, unspecified hypotension type [I95.9] Leukocytosis, unspecified type [D72.829]   Patient currently in rehab at [facility] where she receives dialysis on a MTWF schedule, supervised by Dr Marcelino.  Patient seen and evaluated during dialysis.    HEMODIALYSIS FLOWSHEET:  Blood Flow Rate (mL/min): 199 mL/min Arterial Pressure (mmHg): -95.55 mmHg Venous Pressure (mmHg): 87.47 mmHg TMP (mmHg): -13.94 mmHg Ultrafiltration Rate (mL/min): 916 mL/min Dialysate Flow Rate (mL/min): 300 ml/min  Patient alert, delayed response. 3L [place].   Labs on Ed arrival show decreased sodium 128, lactic acid 2.1. Respiratory panel negative.  Chest xray shows diffuse bilateral interstitial opacities.   We have been consulted to manage dialysis needs.   Objective:  Vital signs in last 24 hours:  Temp:  [97.4 F (36.3 C)-98.2 F (36.8 C)] 97.6 F (36.4 C) (01/06 1110) Pulse Rate:  [53-72] 72 (01/06 1110) Resp:  [13-22] 16 (01/06 1110) BP: (82-180)/(41-68) 123/55 (01/06 1110) SpO2:  [93 %-100 %] 100 % (01/06 1110) Weight:  [46 kg-47.5 kg] 46 kg (01/06 1110)  Weight change:  Filed Weights   09/29/24 1041 09/30/24 0751 09/30/24 1110  Weight: 53.7 kg 47.5 kg 46 kg    Intake/Output: I/O last 3 completed shifts: In: 620 [P.O.:120; IV Piggyback:500] Out: -    Intake/Output this shift:  Total I/O In: -  Out: 1500 [Other:1500]  Physical Exam: General: NAD  Head: Normocephalic, atraumatic. Moist oral mucosal membranes  Eyes: Anicteric  Lungs:  Clear to auscultation  Heart: Regular rate  and rhythm  Abdomen:  Soft, nontender  Extremities:  NOperipheral edema.  Neurologic: Awake, alert, conversant  Skin: Warm,dry, no rash  Access: Lt AVF    Basic Metabolic Panel: Recent Labs  Lab 09/29/24 1107 09/30/24 0505  NA 128* 127*  K 3.5 4.2  CL 89* 90*  CO2 30 26  GLUCOSE 88 74  BUN 24* 26*  CREATININE 4.85* 5.36*  CALCIUM  8.9 8.0*    Liver Function Tests: Recent Labs  Lab 09/29/24 1107  AST 26  ALT <5  ALKPHOS 76  BILITOT 0.4  PROT 6.6  ALBUMIN  2.5*   No results for input(s): LIPASE, AMYLASE in the last 168 hours. No results for input(s): AMMONIA in the last 168 hours.  CBC: Recent Labs  Lab 09/29/24 1107 09/30/24 0505  WBC 23.1* 25.9*  NEUTROABS 19.2*  --   HGB 12.9 11.7*  HCT 40.1 35.7*  MCV 92.6 90.6  PLT 1,033* 963*    Cardiac Enzymes: No results for input(s): CKTOTAL, CKMB, CKMBINDEX, TROPONINI in the last 168 hours.  BNP: Invalid input(s): POCBNP  CBG: No results for input(s): GLUCAP in the last 168 hours.  Microbiology: Results for orders placed or performed during the hospital encounter of 09/29/24  Resp panel by RT-PCR (RSV, Flu A&B, Covid) Anterior Nasal Swab     Status: None   Collection Time: 09/29/24 11:07 AM   Specimen: Anterior Nasal Swab  Result Value Ref Range Status   SARS Coronavirus 2 by RT PCR NEGATIVE NEGATIVE Final    Comment: (NOTE) SARS-CoV-2 target nucleic acids are NOT DETECTED.  The SARS-CoV-2 RNA is generally detectable in upper respiratory specimens during  the acute phase of infection. The lowest concentration of SARS-CoV-2 viral copies this assay can detect is 138 copies/mL. A negative result does not preclude SARS-Cov-2 infection and should not be used as the sole basis for treatment or other patient management decisions. A negative result may occur with  improper specimen collection/handling, submission of specimen other than nasopharyngeal swab, presence of viral mutation(s) within  the areas targeted by this assay, and inadequate number of viral copies(<138 copies/mL). A negative result must be combined with clinical observations, patient history, and epidemiological information. The expected result is Negative.  Fact Sheet for Patients:  bloggercourse.com  Fact Sheet for Healthcare Providers:  seriousbroker.it  This test is no t yet approved or cleared by the [place]  FDA and  has been authorized for detection and/or diagnosis of SARS-CoV-2 by FDA under an Emergency Use Authorization (EUA). This EUA will remain  in effect (meaning this test can be used) for the duration of the COVID-19 declaration under Section 564(b)(1) of the Act, 21 U.S.C.section 360bbb-3(b)(1), unless the authorization is terminated  or revoked sooner.       Influenza A by PCR NEGATIVE NEGATIVE Final   Influenza B by PCR NEGATIVE NEGATIVE Final    Comment: (NOTE) The Xpert Xpress SARS-CoV-2/FLU/RSV plus assay is intended as an aid in the diagnosis of influenza from Nasopharyngeal swab specimens and should not be used as a sole basis for treatment. Nasal washings and aspirates are unacceptable for Xpert Xpress SARS-CoV-2/FLU/RSV testing.  Fact Sheet for Patients: bloggercourse.com  Fact Sheet for Healthcare Providers: seriousbroker.it  This test is not yet approved or cleared by the [place]  FDA and has been authorized for detection and/or diagnosis of SARS-CoV-2 by FDA under an Emergency Use Authorization (EUA). This EUA will remain in effect (meaning this test can be used) for the duration of the COVID-19 declaration under Section 564(b)(1) of the Act, 21 U.S.C. section 360bbb-3(b)(1), unless the authorization is terminated or revoked.     Resp Syncytial Virus by PCR NEGATIVE NEGATIVE Final    Comment: (NOTE) Fact Sheet for  Patients: bloggercourse.com  Fact Sheet for Healthcare Providers: seriousbroker.it  This test is not yet approved or cleared by the [place]  FDA and has been authorized for detection and/or diagnosis of SARS-CoV-2 by FDA under an Emergency Use Authorization (EUA). This EUA will remain in effect (meaning this test can be used) for the duration of the COVID-19 declaration under Section 564(b)(1) of the Act, 21 U.S.C. section 360bbb-3(b)(1), unless the authorization is terminated or revoked.  Performed at [facility], [address] [place], [place] [address]   Blood culture (single)     Status: None (Preliminary result)   Collection Time: 09/29/24 11:08 AM   Specimen: BLOOD  Result Value Ref Range Status   Specimen Description BLOOD RIGHT ARM  Final   Special Requests   Final    BOTTLES DRAWN AEROBIC AND ANAEROBIC Blood Culture results may not be optimal due to an inadequate volume of blood received in culture bottles   Culture   Final    NO GROWTH < 24 HOURS Performed at [facility], [address] [place], [place] [address]    Report Status PENDING  Incomplete  Culture, blood (Routine X 2) w Reflex to ID Panel     Status: None (Preliminary result)   Collection Time: 09/29/24 10:53 PM   Specimen: BLOOD  Result Value Ref Range Status   Specimen Description BLOOD BLOOD RIGHT ARM  Final  Special Requests AEROBIC BOTTLE ONLY Blood Culture adequate volume  Final   Culture   Final    NO GROWTH < 12 HOURS Performed at [facility], [address] [place], [place] [address]    Report Status PENDING  Incomplete  Culture, blood (Routine X 2) w Reflex to ID Panel     Status: None (Preliminary result)   Collection Time: 09/29/24 10:55 PM   Specimen: BLOOD  Result Value Ref Range Status   Specimen Description BLOOD BLOOD RIGHT ARM  Final   Special Requests AEROBIC BOTTLE ONLY Blood Culture  adequate volume  Final   Culture   Final    NO GROWTH < 12 HOURS Performed at [facility], [address], [place], [place] [address]    Report Status PENDING  Incomplete    Coagulation Studies: Recent Labs    09/29/24 1107  LABPROT 15.1  INR 1.1    Urinalysis: No results for input(s): COLORURINE, LABSPEC, PHURINE, GLUCOSEU, HGBUR, BILIRUBINUR, KETONESUR, PROTEINUR, UROBILINOGEN, NITRITE, LEUKOCYTESUR in the last 72 hours.  Invalid input(s): APPERANCEUR    Imaging: DG Chest Port 1 View Result Date: 09/29/2024 CLINICAL DATA:  Altered mental status.  History of dialysis. EXAM: PORTABLE CHEST 1 VIEW COMPARISON:  08/19/2024. FINDINGS: The heart size and mediastinal contours are within unchanged. Aortic atherosclerosis. Persistent left basilar opacity with volume loss, which may reflect left basilar atelectasis or infiltrate with small left pleural effusion. New small right pleural effusion with right basilar opacities, which could reflect atelectasis or infiltrate. Diffuse bilateral interstitial opacities, most compatible with interstitial pulmonary edema. No pneumothorax. Diffuse osseous demineralization. No acute osseous abnormality. IMPRESSION: 1. Diffuse bilateral interstitial opacities, most compatible with interstitial pulmonary edema. 2. New small right pleural effusion with right basilar opacities, which could reflect atelectasis or infiltrate. 3. Persistent left basilar opacity with volume loss, which may reflect left basilar atelectasis or infiltrate with small left pleural effusion. Electronically Signed   By: Harrietta Sherry M.D.   On: 09/29/2024 12:10     Medications:    sodium chloride       acidophilus  1 capsule Oral BID   aspirin   81 mg Oral Daily   calcium  acetate  1,334 mg Oral TID WC   Chlorhexidine  Gluconate Cloth  6 each Topical Q0600   collagenase    Topical Daily   feeding supplement (NEPRO CARB STEADY)  237 mL Oral BID BM    gabapentin   100 mg Oral BID   gabapentin   200 mg Oral QHS   heparin   5,000 Units Subcutaneous Q8H   hydroxyurea   300 mg Oral Q supper   latanoprost   1 drop Both Eyes QHS   levothyroxine   88 mcg Oral Daily   melatonin  5 mg Oral QHS   pantoprazole   40 mg Oral Daily   pentafluoroprop-tetrafluoroeth  1 Application Topical Q T,Th,Sa-HD   polyethylene glycol  1 packet Oral Daily   rOPINIRole   0.5 mg Oral BID   rosuvastatin   20 mg Oral Daily   senna-docusate  2 tablet Oral QHS   sertraline   50 mg Oral Daily   sodium chloride  flush  3 mL Intravenous Q12H   timolol   1 drop Both Eyes q morning   sodium chloride , albuterol , ALPRAZolam , azelastine , benzocaine , HYDROcodone -acetaminophen , sodium chloride  flush  Assessment/ Plan:  Ms. LADAIJA DIMINO is a 89 y.o.  female  is a 89 y.o.  female with past medical conditions including anxiety, hypertension, RLS, neuropathy, and end-stage renal disease on hemodialysis.  Patient  presents to the emergency department with altered mental status and has been admitted for ESRD (end stage renal disease) on dialysis (HCC) [N18.6, Z99.2] Hypotension [I95.9] Hypotension, unspecified hypotension type [I95.9] Leukocytosis, unspecified type [D72.829]  [facility] MTWF/left AVF   Hypotension with unclear etiology. Blood cultures pending. Amlodipine  held.   2. End stage renal disease on hemodialysis. Receives dialysis at rehab facility. Will perform dialysis today.   3. Anemia of chronic kidney disease Lab Results  Component Value Date   HGB 11.7 (L) 09/30/2024    Hgb acceptable for renal patient.   4. Secondary Hyperparathyroidism: with outpatient labs: none available Lab Results  Component Value Date   CALCIUM  8.0 (L) 09/30/2024   CAION 1.11 (L) 05/25/2023   PHOS 3.3 07/31/2024    Will monitor bone minerals during this admission.    LOS: 0 Primus Gritton 1/6/20261:42 PM   "

## 2024-09-30 NOTE — Progress Notes (Signed)
 [facility] Room 206 [facility] Liaison Note  Referral received from ICM, Corean Haddock, RN, for palliative care follow up at [facility].    Referral submitted today  Please call with any palliative care questions.  Thank you for the opportunity to participate in this patient's care  [facility] Liaision [phone]

## 2024-09-30 NOTE — Plan of Care (Signed)
  Problem: Clinical Measurements: Goal: Respiratory complications will improve Outcome: Progressing Goal: Cardiovascular complication will be avoided Outcome: Progressing   Problem: Nutrition: Goal: Adequate nutrition will be maintained Outcome: Progressing   Problem: Coping: Goal: Level of anxiety will decrease Outcome: Progressing   Problem: Pain Managment: Goal: General experience of comfort will improve and/or be controlled Outcome: Progressing   Problem: Safety: Goal: Ability to remain free from injury will improve Outcome: Progressing

## 2024-09-30 NOTE — Consult Note (Addendum)
 WOC Nurse Consult Note: Reason for Consult: sacral wound  Wound type: Deep Tissue Pressure Injury purple maroon discoloration evolving to unstageable with necrotic tissue noted central aspect and pink moist tissue noted to buttocks  Pressure Injury POA: Yes Measurement: see nursing flowsheet  Wound bed: as above  Drainage (amount, consistency, odor) see nursing flowsheet  Periwound: purple maroon discoloration  Dressing procedure/placement/frequency: Cleanse sacral wound with Vashe, do not rinse.  Apply 1/4 thick layer of Santyl  to dark necrotic wound bed daily, top with saline moist gauze.  Cover entire area with Xeroform gauze Soila [phone]) and secure with silicone foam or ABD pad and clothe tape whichever is preferred .  POC discussed with bedside nurse. WOC team will not follow. Reconsult if further needs arise.   Thank you,    Powell Bar MSN, RN-BC, [employer]

## 2024-09-30 NOTE — Progress Notes (Signed)
"   Pt receives outpt HD at [facility] on MTWF. Navigator following to assist with any HD needs.  Suzen Satchel Dialysis Navigator [phone] "

## 2024-09-30 NOTE — TOC Transition Note (Signed)
 Transition of Care [facility]) - Discharge Note   Patient Details  Name: Cassandra [name] MRN: [id number] Date of Birth: [birth year]  Transition of [facility]) CM/SW Contact:  Corean ONEIDA Haddock, RN Phone Number: 09/30/2024, 4:33 PM   Clinical Narrative:     Patient will DC to: [facility] Anticipated DC date: 09/30/2024  Family notified: VM for husband full, text sent requesting return call  Transport by: [facility]   Per MD patient ready for DC to . RN, patient, , and facility notified of DC. Discharge Summary sent to facility. RN given number for report. DC packet on chart. Ambulance transport requested for patient.   TOC signing off.         Patient Goals and CMS Choice            Discharge Placement                       Discharge Plan and Services Additional resources added to the After Visit Summary for                                       Social Drivers of Health (SDOH) Interventions SDOH Screenings   Food Insecurity: No Food Insecurity (09/29/2024)  Housing: Low Risk (09/29/2024)  Transportation Needs: No Transportation Needs (09/29/2024)  Utilities: Not At Risk (09/29/2024)  Financial Resource Strain: Low Risk  (06/25/2024)   Received from [facility]  Social Connections: Moderately Isolated (08/20/2024)  Tobacco Use: Medium Risk (09/29/2024)     Readmission Risk Interventions    07/28/2024    2:52 PM 03/09/2023    4:09 PM  Readmission Risk Prevention Plan  Transportation Screening Complete Complete  PCP or Specialist Appt within 3-5 Days  Complete  HRI or Home Care Consult  Complete  Social Work Consult for Recovery Care Planning/Counseling  Complete  Palliative Care Screening  Not Applicable  Medication Review [employer]) Complete Complete  PCP or Specialist appointment within 3-5 days of discharge Complete   HRI or Home Care Consult Complete   SW Recovery Care/Counseling Consult Complete   Palliative Care Screening Not  Applicable   Skilled Nursing Facility Not Applicable

## 2024-09-30 NOTE — Discharge Summary (Addendum)
 Cassandra [name] FMW:[id number] DOB: 11/17/1926 DOA: 09/29/2024  PCP: Cleotilde Oneil FALCON, MD  Admit date: 09/29/2024 Discharge date: 09/30/2024  Time spent: 35 minutes  Recommendations for Outpatient Follow-up:  Establish with palliative (referral placed and TOC to reach out to them)     Discharge Diagnoses:  Principal Problem:   Hypotension Active Problems:   Anemia in ESRD (end-stage renal disease) (HCC)   CAD (coronary artery disease), native coronary artery   ESRD (end stage renal disease) on dialysis [facility])   Recent cerebrovascular accident 10/12/2021 (CVA)   Diabetes mellitus, type II (HCC)   Acquired hypothyroidism   Hypoxemia   GIB (gastrointestinal bleeding)   ESRD on dialysis [facility])   Essential hypertension   Discharge Condition: stable  Diet recommendation: ad lib  Filed Weights   09/29/24 1041 09/30/24 0751 09/30/24 1110  Weight: 53.7 kg 47.5 kg 46 kg    History of present illness:  From admission h and p Cassandra [name] is a 89 y.o. female with medical history significant for myeloproliferative disorder, hypothyroid, anemia of kidney disease, dm, esrd on hemodialysis, htn, gi bleed, cva, who presents with the above. Reportedly more lethargic and less responsive than normal this morning, was hypotensive when bp was checked. Denies cough or shortness of breath. Denies fever. Denies pain. Denies vomiting or diarrhea. Denies new weakness or numbness. Currently feels back to baseline.   Hospital Course:   Patient presents with an episode of hypotension and decreased responsiveness at skilled nursing. BPs found to be low on arrival, improved with a bolus. No focal symptoms. Covid/flu/rsv neg, blood cultures ngtd. CXR abnormal, given myeloproliferative disorder, PE is in ddx. As is CVA given recent CVA, though nothing focal on neuro exam. After thoughtful consideration, husband on hospital day one says he has decided he does not want to pursue continued evaluations and  treatments. Instead he wants to focus more on comfort and quality of life. Not ready to stop dialysis, but wants to cease continued evaluation and treatment for other problems here in the hospital and moving forward. Will thus proceed with discharge, with plan for palliative f/u as outpt (holding on hospice per TOC as still in rehab).  Procedures: none   Consultations: none  Discharge Exam: Vitals:   09/30/24 1110 09/30/24 1556  BP: (!) 123/55 (!) 105/51  Pulse: 72 63  Resp: 16 16  Temp: 97.6 F (36.4 C) 98 F (36.7 C)  SpO2: 100% 96%    General: NAD Cardiovascular: rrr, soft systolic murmur Respiratory: scattered rales, normal wob  Discharge Instructions   Discharge Instructions     Ambulatory referral to Hospice   Complete by: As directed    Discharge wound care:   Complete by: As directed    Cleanse sacral wound with Vashe, do not rinse.  Apply 1/4 thick layer of Santyl  to dark necrotic wound bed daily, top with saline moist gauze.  Cover entire area with Xeroform gauze Soila [phone] and secure with silicone foam or ABD pad and clothe tape whichever is preferred .   Increase activity slowly   Complete by: As directed       Allergies as of 09/30/2024       Reactions   Atorvastatin Other (See Comments)   MYALGIA   Cyclobenzaprine Other (See Comments)   hallucination   Mirtazapine    Other reaction(s): Hallucination   Oxycodone-acetaminophen  Other (See Comments)   hallucination   Paroxetine Hcl Other (See Comments)   hallucination   Penicillins Swelling  Lip and orbital swelling Tolerated 3rd generation cephalosporin (CEFTRIAXONE ) between 03/05/2023 and 03/09/2023 with no documented ADRs.   Propoxyphene Other (See Comments)   hallucination   Trazodone  Other (See Comments)   hallucination        Medication List     STOP taking these medications    amLODipine  5 MG tablet Commonly known as: NORVASC    amoxicillin-clavulanate 875-125 MG  tablet Commonly known as: AUGMENTIN   cloNIDine  0.1 MG tablet Commonly known as: CATAPRES    doxycycline  100 MG tablet Commonly known as: ADOXA   rosuvastatin  10 MG tablet Commonly known as: CRESTOR    Siklos  100 MG Tabs Generic drug: Hydroxyurea        TAKE these medications    rOPINIRole  0.5 MG tablet Commonly known as: REQUIP  Take 1 tablet (0.5 mg total) by mouth in the morning and at bedtime. The timing of this medication is very important.   albuterol  108 (90 Base) MCG/ACT inhaler Commonly known as: VENTOLIN  HFA Inhale 2 puffs into the lungs every 6 (six) hours as needed for shortness of breath.   ALPRAZolam  0.25 MG tablet Commonly known as: XANAX  Take 1 tablet (0.25 mg total) by mouth daily as needed.   aspirin  81 MG chewable tablet Chew 1 tablet by mouth daily.   azelastine  0.1 % nasal spray Commonly known as: ASTELIN  Place 2 sprays into the nose 2 (two) times daily.   benzocaine  10 % mucosal gel Commonly known as: ORAJEL Use as directed 1 Application in the mouth or throat 3 (three) times daily as needed for mouth pain.   betamethasone valerate 0.1 % cream Commonly known as: VALISONE Apply 1 Application topically daily.   calcium  acetate 667 MG capsule Commonly known as: PHOSLO  Take 1,334 mg by mouth 3 (three) times daily.   Calmoseptine 0.44-20.6 % Oint Generic drug: Menthol -Zinc  Oxide Apply 1 Application topically 3 (three) times daily.   cetirizine 10 MG tablet Commonly known as: ZYRTEC Take 1 tablet by mouth daily as needed for allergies.   CryoDose TA Aero Generic drug: pentafluoroprop-tetrafluoroeth Apply 1 Application topically as directed. SPRAY 1 AS DIRECTED FOR USE DURING DIALYSIS TO ACCESS   [employer] D3 25 MCG (1000 UT) capsule Generic drug: Cholecalciferol  Take 1,000 Units by mouth daily.   feeding supplement (NEPRO CARB STEADY) Liqd Take 237 mLs by mouth 2 (two) times daily between meals.   gabapentin  100 MG capsule Commonly known  as: NEURONTIN  Take 1-2 capsules (100-200 mg total) by mouth 3 (three) times daily. 100 mg am 100 mg afternoon 200 mg bedtime. Home med.   glucose blood test strip Use once daily. Use as instructed.   HealthyLax 17 g packet Generic drug: polyethylene glycol Take 1 packet by mouth daily.   HYDROcodone -acetaminophen  5-325 MG tablet Commonly known as: NORCO/VICODIN Take 1 tablet by mouth 3 (three) times daily as needed for moderate pain (pain score 4-6) or severe pain (pain score 7-10).   lactobacillus acidophilus Tabs tablet Take 2 tablets by mouth 2 (two) times daily.   latanoprost  0.005 % ophthalmic solution Commonly known as: XALATAN  Place 1 drop into both eyes at bedtime.   levothyroxine  88 MCG tablet Commonly known as: SYNTHROID  Take 88 mcg by mouth daily. Take on an empty stomach 30 to 60 minutes before breakfast   lidocaine -prilocaine  cream Commonly known as: EMLA  Apply 1 Application topically as needed.   melatonin 3 MG Tabs tablet Take 6 mg by mouth at bedtime.   naloxone 4 MG/0.1ML Liqd nasal spray kit Commonly known as:  NARCAN Place 4 mg into the nose once.   Protonix  40 MG tablet Generic drug: pantoprazole  Take 1 tablet by mouth daily.   senna-docusate 8.6-50 MG tablet Commonly known as: Senokot-S Take 2 tablets by mouth at bedtime.   sertraline  50 MG tablet Commonly known as: ZOLOFT  Take 50 mg by mouth daily.   timolol  0.5 % ophthalmic gel-forming Commonly known as: TIMOPTIC -XR Place 1 drop into both eyes every morning.   white petrolatum ointment Apply 1 application  topically 3 (three) times daily as needed for dry skin.               Discharge Care Instructions  (From admission, onward)           Start     Ordered   09/30/24 0000  Discharge wound care:       Comments: Cleanse sacral wound with Vashe, do not rinse.  Apply 1/4 thick layer of Santyl  to dark necrotic wound bed daily, top with saline moist gauze.  Cover entire area  with Xeroform gauze Soila [phone]) and secure with silicone foam or ABD pad and clothe tape whichever is preferred .   09/30/24 1555           Allergies[1]    The results of significant diagnostics from this hospitalization (including imaging, microbiology, ancillary and laboratory) are listed below for reference.    Significant Diagnostic Studies: DG Chest Port 1 View Result Date: 09/29/2024 CLINICAL DATA:  Altered mental status.  History of dialysis. EXAM: PORTABLE CHEST 1 VIEW COMPARISON:  08/19/2024. FINDINGS: The heart size and mediastinal contours are within unchanged. Aortic atherosclerosis. Persistent left basilar opacity with volume loss, which may reflect left basilar atelectasis or infiltrate with small left pleural effusion. New small right pleural effusion with right basilar opacities, which could reflect atelectasis or infiltrate. Diffuse bilateral interstitial opacities, most compatible with interstitial pulmonary edema. No pneumothorax. Diffuse osseous demineralization. No acute osseous abnormality. IMPRESSION: 1. Diffuse bilateral interstitial opacities, most compatible with interstitial pulmonary edema. 2. New small right pleural effusion with right basilar opacities, which could reflect atelectasis or infiltrate. 3. Persistent left basilar opacity with volume loss, which may reflect left basilar atelectasis or infiltrate with small left pleural effusion. Electronically Signed   By: Harrietta Sherry M.D.   On: 09/29/2024 12:10    Microbiology: Recent Results (from the past 240 hours)  Resp panel by RT-PCR (RSV, Flu A&B, Covid) Anterior Nasal Swab     Status: None   Collection Time: 09/29/24 11:07 AM   Specimen: Anterior Nasal Swab  Result Value Ref Range Status   SARS Coronavirus 2 by RT PCR NEGATIVE NEGATIVE Final    Comment: (NOTE) SARS-CoV-2 target nucleic acids are NOT DETECTED.  The SARS-CoV-2 RNA is generally detectable in upper respiratory specimens during the  acute phase of infection. The lowest concentration of SARS-CoV-2 viral copies this assay can detect is 138 copies/mL. A negative result does not preclude SARS-Cov-2 infection and should not be used as the sole basis for treatment or other patient management decisions. A negative result may occur with  improper specimen collection/handling, submission of specimen other than nasopharyngeal swab, presence of viral mutation(s) within the areas targeted by this assay, and inadequate number of viral copies(<138 copies/mL). A negative result must be combined with clinical observations, patient history, and epidemiological information. The expected result is Negative.  Fact Sheet for Patients:  bloggercourse.com  Fact Sheet for Healthcare Providers:  seriousbroker.it  This test is no t yet approved or  cleared by the [place]  FDA and  has been authorized for detection and/or diagnosis of SARS-CoV-2 by FDA under an Emergency Use Authorization (EUA). This EUA will remain  in effect (meaning this test can be used) for the duration of the COVID-19 declaration under Section 564(b)(1) of the Act, 21 U.S.C.section 360bbb-3(b)(1), unless the authorization is terminated  or revoked sooner.       Influenza A by PCR NEGATIVE NEGATIVE Final   Influenza B by PCR NEGATIVE NEGATIVE Final    Comment: (NOTE) The Xpert Xpress SARS-CoV-2/FLU/RSV plus assay is intended as an aid in the diagnosis of influenza from Nasopharyngeal swab specimens and should not be used as a sole basis for treatment. Nasal washings and aspirates are unacceptable for Xpert Xpress SARS-CoV-2/FLU/RSV testing.  Fact Sheet for Patients: bloggercourse.com  Fact Sheet for Healthcare Providers: seriousbroker.it  This test is not yet approved or cleared by the [place]  FDA and has been authorized for detection and/or  diagnosis of SARS-CoV-2 by FDA under an Emergency Use Authorization (EUA). This EUA will remain in effect (meaning this test can be used) for the duration of the COVID-19 declaration under Section 564(b)(1) of the Act, 21 U.S.C. section 360bbb-3(b)(1), unless the authorization is terminated or revoked.     Resp Syncytial Virus by PCR NEGATIVE NEGATIVE Final    Comment: (NOTE) Fact Sheet for Patients: bloggercourse.com  Fact Sheet for Healthcare Providers: seriousbroker.it  This test is not yet approved or cleared by the [place]  FDA and has been authorized for detection and/or diagnosis of SARS-CoV-2 by FDA under an Emergency Use Authorization (EUA). This EUA will remain in effect (meaning this test can be used) for the duration of the COVID-19 declaration under Section 564(b)(1) of the Act, 21 U.S.C. section 360bbb-3(b)(1), unless the authorization is terminated or revoked.  Performed at [facility], [address] [place], [place] [address]   Blood culture (single)     Status: None (Preliminary result)   Collection Time: 09/29/24 11:08 AM   Specimen: BLOOD  Result Value Ref Range Status   Specimen Description BLOOD RIGHT ARM  Final   Special Requests   Final    BOTTLES DRAWN AEROBIC AND ANAEROBIC Blood Culture results may not be optimal due to an inadequate volume of blood received in culture bottles   Culture   Final    NO GROWTH < 24 HOURS Performed at [facility], [address], [place], [place] [address]    Report Status PENDING  Incomplete  Culture, blood (Routine X 2) w Reflex to ID Panel     Status: None (Preliminary result)   Collection Time: 09/29/24 10:53 PM   Specimen: BLOOD  Result Value Ref Range Status   Specimen Description BLOOD BLOOD RIGHT ARM  Final   Special Requests AEROBIC BOTTLE ONLY Blood Culture adequate volume  Final   Culture   Final    NO GROWTH < 12  HOURS Performed at [facility], [address], [place], [place] [address]    Report Status PENDING  Incomplete  Culture, blood (Routine X 2) w Reflex to ID Panel     Status: None (Preliminary result)   Collection Time: 09/29/24 10:55 PM   Specimen: BLOOD  Result Value Ref Range Status   Specimen Description BLOOD BLOOD RIGHT ARM  Final   Special Requests AEROBIC BOTTLE ONLY Blood Culture adequate volume  Final   Culture   Final    NO GROWTH < 12 HOURS Performed at [facility], [address] [place], [place] [address]    Report Status PENDING  Incomplete     Labs: Basic Metabolic Panel: Recent Labs  Lab 09/29/24 1107 09/30/24 0505  NA 128* 127*  K 3.5 4.2  CL 89* 90*  CO2 30 26  GLUCOSE 88 74  BUN 24* 26*  CREATININE 4.85* 5.36*  CALCIUM  8.9 8.0*   Liver Function Tests: Recent Labs  Lab 09/29/24 1107  AST 26  ALT <5  ALKPHOS 76  BILITOT 0.4  PROT 6.6  ALBUMIN  2.5*   No results for input(s): LIPASE, AMYLASE in the last 168 hours. No results for input(s): AMMONIA in the last 168 hours. CBC: Recent Labs  Lab 09/29/24 1107 09/30/24 0505  WBC 23.1* 25.9*  NEUTROABS 19.2*  --   HGB 12.9 11.7*  HCT 40.1 35.7*  MCV 92.6 90.6  PLT 1,033* 963*   Cardiac Enzymes: No results for input(s): CKTOTAL, CKMB, CKMBINDEX, TROPONINI in the last 168 hours. BNP: BNP (last 3 results) Recent Labs    04/08/24 0605  BNP 905.4*    ProBNP (last 3 results) No results for input(s): PROBNP in the last 8760 hours.  CBG: No results for input(s): GLUCAP in the last 168 hours.     Signed:  Devaughn KATHEE Ban MD.  [facility] 09/30/2024, 3:57 PM     [1]  Allergies Allergen Reactions   Atorvastatin Other (See Comments)    MYALGIA   Cyclobenzaprine Other (See Comments)    hallucination   Mirtazapine     Other reaction(s): Hallucination   Oxycodone-Acetaminophen  Other (See Comments)    hallucination   Paroxetine Hcl Other  (See Comments)    hallucination   Penicillins Swelling    Lip and orbital swelling Tolerated 3rd generation cephalosporin (CEFTRIAXONE ) between 03/05/2023 and 03/09/2023 with no documented ADRs.   Propoxyphene Other (See Comments)    hallucination   Trazodone  Other (See Comments)    hallucination

## 2024-09-30 NOTE — NC FL2 (Signed)
 " [place]  MEDICAID FL2 LEVEL OF CARE FORM     IDENTIFICATION  Patient Name: Cassandra [name] Birthdate: 03/12/1927 Sex: female Admission Date (Current Location): 09/29/2024  [facility] and [place] Number:  Chiropodist and Address:         Provider Number: [id number]  Attending Physician Name and Address:  Kandis Devaughn Sayres, MD  Relative Name and Phone Number:       Current Level of Care: Hospital Recommended Level of Care: Skilled Nursing Facility Prior Approval Number:    Date Approved/Denied:   PASRR Number: [id number]  Discharge Plan: SNF    Current Diagnoses: Patient Active Problem List   Diagnosis Date Noted   Elevated troponin 08/20/2024   Anemia in ESRD (end-stage renal disease) (HCC) 08/19/2024   Type II diabetes mellitus with renal manifestations (HCC) 08/19/2024   Myocardial injury 08/19/2024   Depression with anxiety 08/19/2024   Pleural effusion 08/19/2024   GI bleeding 08/19/2024   Sacral ulcer (HCC) 08/19/2024   Prolonged QT interval 08/19/2024   Moderate protein-calorie malnutrition 08/18/2024   NSTEMI (non-ST elevated myocardial infarction) (HCC) 07/27/2024   Essential hypertension 07/27/2024   Hypothyroidism 07/27/2024   Dyslipidemia 07/27/2024   Depression 07/27/2024   Sepsis due to pneumonia (HCC) 07/26/2024   Stroke (HCC) 07/16/2024   ESRD on dialysis (HCC) 07/16/2024   Mechanical complication of AV shunt 06/01/2024   Acute hypoxemic respiratory failure (HCC) 04/08/2024   Complication of AV dialysis fistula, initial encounter 08/06/2023   Renal failure 03/06/2023   Acute hypoxic respiratory failure (HCC) 03/05/2023   Pneumonia 03/05/2023   ESRD (end stage renal disease) on dialysis (HCC) 03/05/2023   Hyperkalemia 03/05/2023   Overweight (BMI 25.0-29.9) 12/14/2021   Acute renal failure superimposed on stage 4 chronic kidney disease (HCC) 12/14/2021   Hematochezia, recurrent 12/11/2021   Recent cerebrovascular accident  10/12/2021 (CVA) 12/11/2021   Hypotension 12/11/2021   GI bleed 12/11/2021   Acute blood loss anemia 12/11/2021   Cholelithiasis 12/11/2021   Acute CVA (cerebrovascular accident) (HCC) 10/13/2021   Leukocytosis 10/13/2021   Rectal bleeding 10/03/2021   Bright red rectal bleeding 10/02/2021   GIB (gastrointestinal bleeding) 10/02/2021   Hypoxemia 05/15/2021   Hypoxia 05/14/2021   Hypertensive urgency 05/14/2021   Tibial plateau fracture 04/21/21, left, closed, with routine healing, subsequent encounter 05/14/2021   CKD (chronic kidney disease) stage 4, GFR 15-29 ml/min (HCC) 04/07/2021   Acquired hypothyroidism 12/02/2020   Hyperlipidemia, mixed 12/02/2020   Aortic atherosclerosis 10/04/2020   Polycythemia vera (HCC) 06/02/2020   Thrombocytosis 02/18/2020   Vitamin D  deficiency 03/25/2018   Hypertension 01/24/2015   Diabetes mellitus, type II (HCC) 01/24/2015   Restless legs syndrome 01/24/2015   Chronic constipation 01/24/2015   Lumbar stenosis with neurogenic claudication 05/04/2014   Peripheral neuropathy 03/09/2014   CAD (coronary artery disease), native coronary artery 01/07/2014    Orientation RESPIRATION BLADDER Height & Weight     Self, Situation, Place  O2 (3L [place]) Incontinent Weight: 46 kg Height:  5' 1 (154.9 cm)  BEHAVIORAL SYMPTOMS/MOOD NEUROLOGICAL BOWEL NUTRITION STATUS      Incontinent Diet (dys 3)  AMBULATORY STATUS COMMUNICATION OF NEEDS Skin   Total Care Verbally Bruising (Deep tissue injury to sacrum and buttocks)                       Personal Care Assistance Level of Assistance              Functional Limitations Info  SPECIAL CARE FACTORS FREQUENCY  PT (By licensed PT), OT (By licensed OT)                    Contractures Contractures Info: Not present    Additional Factors Info  Code Status, Allergies Code Status Info: DNR Allergies Info: Atorvastatin, Cyclobenzaprine, Mirtazapine, Oxycodone-acetaminophen ,  Paroxetine Hcl, Penicillins, Propoxyphene, Trazodone            Current Medications (09/30/2024):  This is the current hospital active medication list Current Facility-Administered Medications  Medication Dose Route Frequency Provider Last Rate Last Admin   acidophilus (RISAQUAD) capsule 1 capsule  1 capsule Oral BID Wouk, Devaughn Sayres, MD   1 capsule at 09/30/24 1146   albuterol  (PROVENTIL ) (2.5 MG/3ML) 0.083% nebulizer solution 2.5 mg  2.5 mg Inhalation Q6H PRN Wouk, Devaughn Sayres, MD       ALPRAZolam  (XANAX ) tablet 0.25 mg  0.25 mg Oral Daily PRN Wouk, Devaughn Sayres, MD       aspirin  chewable tablet 81 mg  81 mg Oral Daily Kandis Devaughn Sayres, MD   81 mg at 09/30/24 1145   azelastine  (ASTELIN ) 0.1 % nasal spray 2 spray  2 spray Each Nare BID PRN Wouk, Devaughn Sayres, MD       benzocaine  (ORAJEL) 10 % mucosal gel 1 Application  1 Application Mouth/Throat TID PRN Wouk, Devaughn Sayres, MD       calcium  acetate (PHOSLO ) capsule 1,334 mg  1,334 mg Oral TID WC Wouk, Devaughn Sayres, MD   1,334 mg at 09/30/24 1146   Chlorhexidine  Gluconate Cloth 2 % PADS 6 each  6 each Topical Q0600 Druscilla Bald, NP   6 each at 09/30/24 0533   collagenase  (SANTYL ) ointment   Topical Daily Kandis Devaughn Sayres, MD   Given at 09/30/24 1146   feeding supplement (NEPRO CARB STEADY) liquid 237 mL  237 mL Oral BID BM Kandis Devaughn Sayres, MD   237 mL at 09/30/24 1334   gabapentin  (NEURONTIN ) capsule 100 mg  100 mg Oral BID Kandis Devaughn Sayres, MD   100 mg at 09/30/24 1145   gabapentin  (NEURONTIN ) capsule 200 mg  200 mg Oral QHS Kandis Devaughn Sayres, MD   200 mg at 09/29/24 2209   heparin  injection 5,000 Units  5,000 Units Subcutaneous Q8H Kandis Devaughn Sayres, MD   5,000 Units at 09/30/24 1334   HYDROcodone -acetaminophen  (NORCO/VICODIN) 5-325 MG per tablet 1 tablet  1 tablet Oral TID PRN Kandis Devaughn Sayres, MD   1 tablet at 09/29/24 1648   hydroxyurea  (DROXIA ) capsule 300 mg  300 mg Oral Q supper Wouk, Noah Bedford, MD   300 mg at  09/29/24 1803   latanoprost  (XALATAN ) 0.005 % ophthalmic solution 1 drop  1 drop Both Eyes QHS Wouk, Noah Bedford, MD   1 drop at 09/29/24 2210   levothyroxine  (SYNTHROID ) tablet 88 mcg  88 mcg Oral Daily Kandis Devaughn Sayres, MD   88 mcg at 09/30/24 9482   melatonin tablet 5 mg  5 mg Oral QHS Kandis Devaughn Sayres, MD   5 mg at 09/29/24 2209   pantoprazole  (PROTONIX ) EC tablet 40 mg  40 mg Oral Daily Kandis Devaughn Sayres, MD   40 mg at 09/30/24 1145   pentafluoroprop-tetrafluoroeth (GEBAUERS) aerosol 1 Application  1 Application Topical Q T,Th,Sa-HD Wouk, Devaughn Sayres, MD       polyethylene glycol (MIRALAX  / GLYCOLAX ) packet 17 g  1 packet Oral Daily Kandis Devaughn Sayres, MD   17 g at 09/30/24 1146  rOPINIRole  (REQUIP ) tablet 0.5 mg  0.5 mg Oral BID Kandis Devaughn Sayres, MD   0.5 mg at 09/30/24 1145   rosuvastatin  (CRESTOR ) tablet 20 mg  20 mg Oral Daily Wouk, Devaughn Sayres, MD   20 mg at 09/30/24 1146   senna-docusate (Senokot-S) tablet 2 tablet  2 tablet Oral QHS Kandis Devaughn Sayres, MD   2 tablet at 09/29/24 2209   sertraline  (ZOLOFT ) tablet 50 mg  50 mg Oral Daily Kandis Devaughn Sayres, MD   50 mg at 09/30/24 1146   sodium chloride  flush (NS) 0.9 % injection 3 mL  3 mL Intravenous Q12H Wouk, Devaughn Sayres, MD   3 mL at 09/30/24 1147   sodium chloride  flush (NS) 0.9 % injection 3 mL  3 mL Intravenous PRN Wouk, Devaughn Sayres, MD       timolol  (TIMOPTIC ) 0.5 % ophthalmic solution 1 drop  1 drop Both Eyes q morning Wouk, Devaughn Sayres, MD   1 drop at 09/30/24 1146     Discharge Medications: Please see discharge summary for a list of discharge medications.  Relevant Imaging Results:  Relevant Lab Results:   Additional Information [phone]  Corean ONEIDA Haddock, RN     "

## 2024-09-30 NOTE — Progress Notes (Signed)
 Report given to [facility].

## 2024-10-04 LAB — CULTURE, BLOOD (ROUTINE X 2)
Culture: NO GROWTH
Culture: NO GROWTH
Special Requests: ADEQUATE
Special Requests: ADEQUATE

## 2024-10-04 LAB — CULTURE, BLOOD (SINGLE): Culture: NO GROWTH

## 2024-10-26 DEATH — deceased

## 2024-11-24 ENCOUNTER — Ambulatory Visit: Admitting: Neurology
